# Patient Record
Sex: Female | Born: 1960 | Race: Black or African American | Hispanic: No | Marital: Single | Smoking: Never smoker
Health system: Southern US, Community
[De-identification: ages and names within clinical notes are randomized; demographics above are authoritative.]

## PROBLEM LIST (undated history)

## (undated) DIAGNOSIS — E249 Cushing's syndrome, unspecified: Secondary | ICD-10-CM

## (undated) DIAGNOSIS — D649 Anemia, unspecified: Secondary | ICD-10-CM

## (undated) DIAGNOSIS — K922 Gastrointestinal hemorrhage, unspecified: Secondary | ICD-10-CM

## (undated) DIAGNOSIS — K5792 Diverticulitis of intestine, part unspecified, without perforation or abscess without bleeding: Secondary | ICD-10-CM

## (undated) DIAGNOSIS — E785 Hyperlipidemia, unspecified: Secondary | ICD-10-CM

## (undated) DIAGNOSIS — E282 Polycystic ovarian syndrome: Secondary | ICD-10-CM

## (undated) DIAGNOSIS — K5790 Diverticulosis of intestine, part unspecified, without perforation or abscess without bleeding: Secondary | ICD-10-CM

## (undated) DIAGNOSIS — I10 Essential (primary) hypertension: Secondary | ICD-10-CM

## (undated) DIAGNOSIS — E119 Type 2 diabetes mellitus without complications: Secondary | ICD-10-CM

## (undated) DIAGNOSIS — A419 Sepsis, unspecified organism: Secondary | ICD-10-CM

## (undated) HISTORY — DX: Cushing's syndrome, unspecified: E24.9

## (undated) HISTORY — DX: Diverticulitis of intestine, part unspecified, without perforation or abscess without bleeding: K57.92

## (undated) HISTORY — DX: Gastrointestinal hemorrhage, unspecified: K92.2

## (undated) HISTORY — DX: Polycystic ovarian syndrome: E28.2

## (undated) HISTORY — DX: Essential (primary) hypertension: I10

## (undated) HISTORY — PX: BREAST BIOPSY: SHX20

## (undated) HISTORY — DX: Anemia, unspecified: D64.9

## (undated) HISTORY — DX: Type 2 diabetes mellitus without complications: E11.9

## (undated) HISTORY — PX: CHOLECYSTECTOMY: SHX55

## (undated) HISTORY — DX: Hyperlipidemia, unspecified: E78.5

---
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
## 1994-11-16 HISTORY — PX: ADRENALECTOMY: SHX876

## 1998-03-26 ENCOUNTER — Other Ambulatory Visit: Admission: RE | Admit: 1998-03-26 | Discharge: 1998-03-26 | Payer: Self-pay | Admitting: Internal Medicine

## 1998-05-09 ENCOUNTER — Ambulatory Visit: Admission: RE | Admit: 1998-05-09 | Discharge: 1998-05-09 | Payer: Self-pay | Admitting: Obstetrics & Gynecology

## 1998-07-10 ENCOUNTER — Encounter: Admission: RE | Admit: 1998-07-10 | Discharge: 1998-10-08 | Payer: Self-pay | Admitting: *Deleted

## 2000-04-30 ENCOUNTER — Other Ambulatory Visit: Admission: RE | Admit: 2000-04-30 | Discharge: 2000-04-30 | Payer: Self-pay | Admitting: Internal Medicine

## 2000-05-10 ENCOUNTER — Encounter: Payer: Self-pay | Admitting: Internal Medicine

## 2000-05-10 ENCOUNTER — Ambulatory Visit (HOSPITAL_COMMUNITY): Admission: RE | Admit: 2000-05-10 | Discharge: 2000-05-10 | Payer: Self-pay | Admitting: Internal Medicine

## 2000-05-18 ENCOUNTER — Encounter: Admission: RE | Admit: 2000-05-18 | Discharge: 2000-05-18 | Payer: Self-pay | Admitting: Internal Medicine

## 2000-05-18 ENCOUNTER — Encounter: Payer: Self-pay | Admitting: Internal Medicine

## 2000-12-10 ENCOUNTER — Encounter: Admission: RE | Admit: 2000-12-10 | Discharge: 2000-12-10 | Payer: Self-pay | Admitting: Internal Medicine

## 2000-12-10 ENCOUNTER — Encounter: Payer: Self-pay | Admitting: Internal Medicine

## 2000-12-23 ENCOUNTER — Encounter: Payer: Self-pay | Admitting: Internal Medicine

## 2000-12-23 ENCOUNTER — Encounter: Admission: RE | Admit: 2000-12-23 | Discharge: 2000-12-23 | Payer: Self-pay | Admitting: Internal Medicine

## 2001-06-29 ENCOUNTER — Other Ambulatory Visit: Admission: RE | Admit: 2001-06-29 | Discharge: 2001-06-29 | Payer: Self-pay | Admitting: Internal Medicine

## 2001-07-05 ENCOUNTER — Encounter: Payer: Self-pay | Admitting: Internal Medicine

## 2001-07-05 ENCOUNTER — Encounter: Admission: RE | Admit: 2001-07-05 | Discharge: 2001-07-05 | Payer: Self-pay | Admitting: Internal Medicine

## 2001-07-21 ENCOUNTER — Encounter: Admission: RE | Admit: 2001-07-21 | Discharge: 2001-07-21 | Payer: Self-pay | Admitting: Internal Medicine

## 2001-07-21 ENCOUNTER — Encounter: Payer: Self-pay | Admitting: Internal Medicine

## 2001-12-08 ENCOUNTER — Encounter: Payer: Self-pay | Admitting: Internal Medicine

## 2001-12-08 ENCOUNTER — Encounter: Admission: RE | Admit: 2001-12-08 | Discharge: 2001-12-08 | Payer: Self-pay | Admitting: Internal Medicine

## 2002-09-07 ENCOUNTER — Other Ambulatory Visit: Admission: RE | Admit: 2002-09-07 | Discharge: 2002-09-07 | Payer: Self-pay | Admitting: Internal Medicine

## 2002-09-26 ENCOUNTER — Encounter: Payer: Self-pay | Admitting: Internal Medicine

## 2002-09-26 ENCOUNTER — Encounter: Admission: RE | Admit: 2002-09-26 | Discharge: 2002-09-26 | Payer: Self-pay | Admitting: Internal Medicine

## 2003-10-15 ENCOUNTER — Encounter: Admission: RE | Admit: 2003-10-15 | Discharge: 2003-10-15 | Payer: Self-pay | Admitting: Internal Medicine

## 2005-12-30 ENCOUNTER — Other Ambulatory Visit: Admission: RE | Admit: 2005-12-30 | Discharge: 2005-12-30 | Payer: Self-pay | Admitting: Obstetrics and Gynecology

## 2006-01-05 ENCOUNTER — Encounter: Admission: RE | Admit: 2006-01-05 | Discharge: 2006-01-05 | Payer: Self-pay | Admitting: Internal Medicine

## 2007-05-13 ENCOUNTER — Other Ambulatory Visit: Admission: RE | Admit: 2007-05-13 | Discharge: 2007-05-13 | Payer: Self-pay | Admitting: Obstetrics and Gynecology

## 2009-04-11 ENCOUNTER — Other Ambulatory Visit: Admission: RE | Admit: 2009-04-11 | Discharge: 2009-04-11 | Payer: Self-pay | Admitting: Obstetrics and Gynecology

## 2009-07-25 ENCOUNTER — Encounter: Admission: RE | Admit: 2009-07-25 | Discharge: 2009-07-25 | Payer: Self-pay | Admitting: Obstetrics and Gynecology

## 2012-02-09 ENCOUNTER — Other Ambulatory Visit: Payer: Self-pay | Admitting: *Deleted

## 2012-02-09 MED ORDER — METFORMIN HCL 500 MG PO TABS: 500.0000 mg | ORAL_TABLET | Freq: Two times a day (BID) | ORAL | Status: DC

## 2012-02-22 ENCOUNTER — Ambulatory Visit (INDEPENDENT_AMBULATORY_CARE_PROVIDER_SITE_OTHER): Payer: 59 | Admitting: Physician Assistant

## 2012-02-22 VITALS — BP 154/104 | HR 91 | Temp 99.3°F | Resp 16 | Ht 63.75 in | Wt 262.0 lb

## 2012-02-22 DIAGNOSIS — IMO0001 Reserved for inherently not codable concepts without codable children: Secondary | ICD-10-CM

## 2012-02-22 DIAGNOSIS — H04123 Dry eye syndrome of bilateral lacrimal glands: Secondary | ICD-10-CM

## 2012-02-22 DIAGNOSIS — J310 Chronic rhinitis: Secondary | ICD-10-CM

## 2012-02-22 DIAGNOSIS — H04129 Dry eye syndrome of unspecified lacrimal gland: Secondary | ICD-10-CM

## 2012-02-22 DIAGNOSIS — I10 Essential (primary) hypertension: Secondary | ICD-10-CM

## 2012-02-22 LAB — POCT GLYCOSYLATED HEMOGLOBIN (HGB A1C): Hemoglobin A1C: 8.8

## 2012-02-22 LAB — GLUCOSE, POCT (MANUAL RESULT ENTRY): POC Glucose: 125

## 2012-02-22 MED ORDER — LOSARTAN POTASSIUM-HCTZ 100-12.5 MG PO TABS: 1.0000 | ORAL_TABLET | Freq: Every day | ORAL | Status: DC

## 2012-02-22 MED ORDER — INSULIN GLARGINE 100 UNIT/ML ~~LOC~~ SOLN: 20.0000 [IU] | Freq: Every day | SUBCUTANEOUS | Status: DC

## 2012-02-22 MED ORDER — METFORMIN HCL 500 MG PO TABS: 1000.0000 mg | ORAL_TABLET | Freq: Two times a day (BID) | ORAL | Status: DC

## 2012-02-22 MED ORDER — IPRATROPIUM BROMIDE 0.03 % NA SOLN: 2.0000 | Freq: Two times a day (BID) | NASAL | Status: DC

## 2012-02-22 NOTE — Progress Notes (Signed)
  Subjective:    Patient ID: Carol Mueller, female    DOB: [birth year], 51 y.o.   MRN: [id number]  HPI  This patient presents for follow-up of DM type 2, HTN.  Overall, doing well, without specific concerns. Out of Hyzaar x nearly 2 weeks. Home glucose monitoring qweek.  Generally 160's-170's (fasting). No hypoglycemia. Eye exam was in December, dental exam scheduled for next week.  Left eye (sometimes the right) watery intermittantly x 2 months. Often eyes feel dry. Review of Systems Diarrhea with metformin-wax and wanes. Some nasal congestion with recent weather changes. ROS is otherwise negative.    Objective:   Physical Exam Vital signs noted. Well-developed, well nourished BF who is awake, alert and oriented, in NAD. HEENT: [place]/AT, PERRL, EOMI.  Sclera and conjunctiva are clear.  EAC are patent, TMs are normal in appearance. Nasal mucosa is pink and moist. OP is clear. Neck: supple, non-tender, no lymphadenopathey, thyromegaly. Heart: RRR, no murmur Lungs: CTA Abdomen: normo-active bowel sounds, supple, non-tender, no mass or organomegaly. Extremities: no cyanosis, clubbing or edema. Skin: warm and dry without rash.  Results for orders placed in visit on 02/22/12  GLUCOSE, POCT (MANUAL RESULT ENTRY)      Component Value Range   POC Glucose 125    POCT GLYCOSYLATED HEMOGLOBIN (HGB A1C)      Component Value Range   Hemoglobin A1C 8.8           Assessment & Plan:   1. Rhinitis    2. Type II or unspecified type diabetes mellitus without mention of complication, uncontrolled  POCT glucose (manual entry), POCT glycosylated hemoglobin (Hb A1C), Comprehensive metabolic panel, Lipid panel, Microalbumin, urine, metFORMIN (GLUCOPHAGE) 500 MG tablet, insulin glargine (LANTUS) 100 UNIT/ML injection (increase by 2 units every 2-3 days if fasting BS >140 mg/dl  3. HTN (hypertension)  Comprehensive metabolic panel, losartan-hydrochlorothiazide (HYZAAR) 100-12.5 MG per tablet  4. Dry  eyes, bilateral  Likely due to allergies.  OTC Systane drops.  Atrovent NS.   Re-evaluate in 3 months, sooner if needed.

## 2012-02-22 NOTE — Patient Instructions (Addendum)
Check your blood sugar 2-3 times each week.  If the reading is above 140, then increase your Lantus dose by 2 units.  For your dry eye symptoms, try OTC Systane eyes drops for dry eyes. Call if you have not heard from the appointment schedulers in 2-3 days; about your remaining lab results in 2 weeks.

## 2012-02-23 LAB — COMPREHENSIVE METABOLIC PANEL
ALT: 17 U/L (ref 0–35)
AST: 18 U/L (ref 0–37)
Albumin: 4.5 g/dL (ref 3.5–5.2)
Alkaline Phosphatase: 66 U/L (ref 39–117)
BUN: 12 mg/dL (ref 6–23)
Chloride: 96 mEq/L (ref 96–112)
Potassium: 7.4 mEq/L (ref 3.5–5.3)

## 2012-02-23 LAB — LIPID PANEL
HDL: 55 mg/dL (ref >39)
LDL Cholesterol: 118 mg/dL — ABNORMAL HIGH (ref 0–99)
Total CHOL/HDL Ratio: 3.4 Ratio

## 2012-02-24 ENCOUNTER — Other Ambulatory Visit: Payer: Self-pay | Admitting: Physician Assistant

## 2012-02-24 ENCOUNTER — Telehealth: Payer: Self-pay

## 2012-02-24 DIAGNOSIS — R899 Unspecified abnormal finding in specimens from other organs, systems and tissues: Secondary | ICD-10-CM

## 2012-02-24 LAB — MICROALBUMIN, URINE: Microalb, Ur: 9.15 mg/dL — ABNORMAL HIGH (ref 0.00–1.89)

## 2012-02-24 NOTE — Telephone Encounter (Signed)
[facility] called stating potassium level was critical at 7.4.  Spoke with Kennedy Bucker PA-C, advised patient needs to return to clinic today for repeat labs.  Helmut Muster has placed a new order for repeat labs in system.

## 2012-02-24 NOTE — Telephone Encounter (Signed)
Left message on machine for patient to call back and speak with me concerning labs.  If I'm not available she needs to speak with Delaney Meigs or Morrie Sheldon.

## 2012-02-26 ENCOUNTER — Other Ambulatory Visit (INDEPENDENT_AMBULATORY_CARE_PROVIDER_SITE_OTHER): Payer: 59

## 2012-02-26 DIAGNOSIS — R6889 Other general symptoms and signs: Secondary | ICD-10-CM

## 2012-02-26 DIAGNOSIS — R899 Unspecified abnormal finding in specimens from other organs, systems and tissues: Secondary | ICD-10-CM

## 2012-02-27 LAB — POTASSIUM: Potassium: 3.8 mEq/L (ref 3.5–5.3)

## 2012-02-29 NOTE — Telephone Encounter (Signed)
Pt was notified and RTC on 02/26/12 for repeat K+. See notes on lab results.

## 2012-03-02 NOTE — Progress Notes (Signed)
Left message 03/02/12.

## 2012-03-03 NOTE — Progress Notes (Signed)
Message left for pt 02/23/12 to schedule CPE. No response, reminder letter sent 03/03/12. Lennon Alstrom

## 2012-03-15 ENCOUNTER — Ambulatory Visit (INDEPENDENT_AMBULATORY_CARE_PROVIDER_SITE_OTHER): Payer: 59 | Admitting: Family Medicine

## 2012-03-15 VITALS — BP 137/92 | HR 97 | Temp 98.9°F | Resp 18 | Ht 64.0 in | Wt 250.0 lb

## 2012-03-15 DIAGNOSIS — M542 Cervicalgia: Secondary | ICD-10-CM

## 2012-03-15 DIAGNOSIS — M62838 Other muscle spasm: Secondary | ICD-10-CM

## 2012-03-15 MED ORDER — CYCLOBENZAPRINE HCL 5 MG PO TABS: 5.0000 mg | ORAL_TABLET | Freq: Three times a day (TID) | ORAL | Status: AC | PRN

## 2012-03-15 MED ORDER — NAPROXEN 500 MG PO TABS: 500.0000 mg | ORAL_TABLET | Freq: Two times a day (BID) | ORAL | Status: DC
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
## 2012-03-22 ENCOUNTER — Ambulatory Visit (INDEPENDENT_AMBULATORY_CARE_PROVIDER_SITE_OTHER): Payer: 59 | Admitting: Family Medicine

## 2012-03-22 ENCOUNTER — Ambulatory Visit: Payer: 59

## 2012-03-22 VITALS — BP 134/95 | HR 93 | Temp 98.1°F | Resp 16 | Ht 63.5 in | Wt 263.0 lb

## 2012-03-22 DIAGNOSIS — M542 Cervicalgia: Secondary | ICD-10-CM

## 2012-03-22 MED ORDER — DIAZEPAM 2 MG PO TABS: 2.0000 mg | ORAL_TABLET | Freq: Four times a day (QID) | ORAL | Status: AC | PRN

## 2012-03-22 NOTE — Progress Notes (Signed)
  Subjective:    Patient ID: Carol Mueller, female    DOB: Apr 07, 1961, 51 y.o.   MRN: [id number]  HPI  See prior OV  Unable to tolerate NSAID secondary to GI distress for cervical spine symptoms  Intermittent neck pain; position dependent(rotation); denies radiation to UE or weakness involving UE    Review of Systems     Objective:   Physical Exam  Constitutional: She appears well-developed.  Neck: Neck supple. No thyromegaly present.  Cardiovascular: Normal rate, regular rhythm and normal heart sounds.   Pulmonary/Chest: Effort normal and breath sounds normal.  Musculoskeletal:       Cervical back: She exhibits decreased range of motion, tenderness and spasm (L) SCM. She exhibits no bony tenderness.  Neurological: She is alert. She has normal strength. No sensory deficit.  Reflex Scores:      Bicep reflexes are 2+ on the right side and 2+ on the left side.   [facility] reading (PRIMARY) by  Dr. Hal Hope C spine no degenerative changes     Assessment & Plan:  Cervical strain/ torticollis Gi distress secondary to NSAID   Tylenol BID prn Valium 2 mg for significant spasm of SCM Reassurance/anticipatory guidance

## 2012-03-29 ENCOUNTER — Telehealth: Payer: Self-pay

## 2012-03-29 NOTE — Telephone Encounter (Signed)
LMOM THAT XR READY FOR P/U.

## 2012-03-29 NOTE — Telephone Encounter (Signed)
Pt requesting a copy of his x-ray please call when ready for pick-up

## 2012-04-06 ENCOUNTER — Ambulatory Visit (INDEPENDENT_AMBULATORY_CARE_PROVIDER_SITE_OTHER): Payer: Managed Care, Other (non HMO) | Admitting: Family Medicine

## 2012-04-06 DIAGNOSIS — J329 Chronic sinusitis, unspecified: Secondary | ICD-10-CM

## 2012-04-06 DIAGNOSIS — J309 Allergic rhinitis, unspecified: Secondary | ICD-10-CM | POA: Insufficient documentation

## 2012-04-06 MED ORDER — AZITHROMYCIN 250 MG PO TABS: ORAL_TABLET | ORAL | Status: AC

## 2012-04-06 MED ORDER — FLUTICASONE PROPIONATE 50 MCG/ACT NA SUSP: 2.0000 | Freq: Every day | NASAL | Status: DC

## 2012-04-06 MED ORDER — CETIRIZINE HCL 10 MG PO TABS: 10.0000 mg | ORAL_TABLET | Freq: Every day | ORAL | Status: DC

## 2012-04-06 NOTE — Progress Notes (Addendum)
  Subjective:    Patient ID: Carol Mueller, female    DOB: [birth year], 51 y.o.   MRN: [id number]  HPI URI Symptoms Onset: 1 week  Description: rhinorrhea, nasal congestion, sinus pressure, post nasal drip, greenish nasal discharge  Modifying factors:  Diabetic; CBGs in the 140s per pt.   Symptoms Nasal discharge: yes Fever: no Sore throat: mild Cough: mild Wheezing: no Ear pain: no GI symptoms: no Sick contacts: no  Red Flags  Stiff neck: no Dyspnea: no Rash: no Swallowing difficulty: no  Sinusitis Risk Factors Headache/face pain: no Double sickening: no tooth pain: no  Allergy Risk Factors Sneezing: no Itchy scratchy eyes/throat: yes  Seasonal symptoms: intermittently   Flu Risk Factors Headache: no muscle aches: no severe fatigue: no  Review of Systems See HPI, otherwise ROS negative     Objective:   Physical Exam Gen: up in chair, NAD, obese  HEENT: NCAT, EOMI, exophthalmus, TMs clear bilaterally, +nasal erythema, rhinorrhea bilaterally, + post oropharyngeal erythema  CV: RRR, no murmurs auscultated PULM: CTAB, no wheezes, rales, rhoncii ABD: S/NT/+ bowel sounds  EXT: 2+ peripheral pulses         Assessment & Plan:

## 2012-04-06 NOTE — Assessment & Plan Note (Signed)
Will transition to zyrtec and flonase for treatment. Given baseline diabetes, will cover for infectious (bacterial) process with azithromycin. Handout given. Infectious red flags discussed.     The patient and/or caregiver has been counseled thoroughly with regard to treatment plan and/or medications prescribed including dosage, schedule, interactions, rationale for use, and possible side effects and they verbalize understanding. Diagnoses and expected course of recovery discussed and will return if not improved as expected or if the condition worsens. Patient and/or caregiver verbalized understanding.

## 2012-04-06 NOTE — Patient Instructions (Signed)
Allergic Rhinitis  Allergic rhinitis is when the mucous membranes in the nose respond to allergens. Allergens are particles in the air that cause your body to have an allergic reaction. This causes you to release allergic antibodies. Through a chain of events, these eventually cause you to release histamine into the blood stream (hence the use of antihistamines). Although meant to be protective to the body, it is this release that causes your discomfort, such as frequent sneezing, congestion and an itchy runny nose.    CAUSES    The pollen allergens may come from grasses, trees, and weeds. This is seasonal allergic rhinitis, or "hay fever." Other allergens cause year-round allergic rhinitis (perennial allergic rhinitis) such as house dust mite allergen, pet dander and mold spores.    SYMPTOMS     Nasal stuffiness (congestion).   Runny, itchy nose with sneezing and tearing of the eyes.   There is often an itching of the mouth, eyes and ears.  It cannot be cured, but it can be controlled with medications.  DIAGNOSIS    If you are unable to determine the offending allergen, skin or blood testing may find it.  TREATMENT     Avoid the allergen.   Medications and allergy shots (immunotherapy) can help.   Hay fever may often be treated with antihistamines in pill or nasal spray forms. Antihistamines block the effects of histamine. There are over-the-counter medicines that may help with nasal congestion and swelling around the eyes. Check with your caregiver before taking or giving this medicine.  If the treatment above does not work, there are many new medications your caregiver can prescribe. Stronger medications may be used if initial measures are ineffective. Desensitizing injections can be used if medications and avoidance fails. Desensitization is when a patient is given ongoing shots until the body becomes less sensitive to the allergen. Make sure you follow up with your caregiver if problems continue.  SEEK  MEDICAL CARE IF:     You develop fever (more than 100.5 F (38.1 C).   You develop a cough that does not stop easily (persistent).   You have shortness of breath.   You start wheezing.   Symptoms interfere with normal daily activities.  Document Released: 07/28/2001 Document Revised: 10/22/2011 Document Reviewed: 02/06/2009  [employer] Patient Information 2012 [employer].

## 2012-04-16 DIAGNOSIS — Z0271 Encounter for disability determination: Secondary | ICD-10-CM

## 2012-04-18 ENCOUNTER — Telehealth: Payer: Self-pay

## 2012-04-18 NOTE — Telephone Encounter (Signed)
Pt wants to know why rx was denied

## 2012-04-19 NOTE — Telephone Encounter (Signed)
LMOM FOR PT TO CB.  FIND OUT WHICH MED WAS DENIED

## 2012-04-19 NOTE — Telephone Encounter (Signed)
Spoke w/pt who reported that her Solostar pen needles were denied. Told pt that I am not sure why it was denied, but that we can RF them. Called Rx into [facility] [address] for Solostar pen needles #100 w/3 RFs

## 2012-04-28 ENCOUNTER — Encounter: Payer: 59 | Admitting: Physician Assistant

## 2012-06-28 ENCOUNTER — Encounter: Payer: Managed Care, Other (non HMO) | Admitting: Physician Assistant

## 2012-08-09 ENCOUNTER — Other Ambulatory Visit: Payer: Self-pay | Admitting: Physician Assistant

## 2012-08-23 ENCOUNTER — Encounter: Payer: Managed Care, Other (non HMO) | Admitting: Physician Assistant

## 2012-09-09 ENCOUNTER — Ambulatory Visit (INDEPENDENT_AMBULATORY_CARE_PROVIDER_SITE_OTHER): Payer: Managed Care, Other (non HMO) | Admitting: Family Medicine

## 2012-09-09 ENCOUNTER — Encounter: Payer: Self-pay | Admitting: Family Medicine

## 2012-09-09 VITALS — BP 162/98 | HR 104 | Temp 98.3°F | Resp 18 | Ht 63.0 in | Wt 261.0 lb

## 2012-09-09 DIAGNOSIS — I10 Essential (primary) hypertension: Secondary | ICD-10-CM

## 2012-09-09 DIAGNOSIS — Z1231 Encounter for screening mammogram for malignant neoplasm of breast: Secondary | ICD-10-CM

## 2012-09-09 DIAGNOSIS — E785 Hyperlipidemia, unspecified: Secondary | ICD-10-CM

## 2012-09-09 DIAGNOSIS — E119 Type 2 diabetes mellitus without complications: Secondary | ICD-10-CM

## 2012-09-09 DIAGNOSIS — E669 Obesity, unspecified: Secondary | ICD-10-CM

## 2012-09-09 DIAGNOSIS — E282 Polycystic ovarian syndrome: Secondary | ICD-10-CM

## 2012-09-09 DIAGNOSIS — E249 Cushing's syndrome, unspecified: Secondary | ICD-10-CM

## 2012-09-09 DIAGNOSIS — Z0000 Encounter for general adult medical examination without abnormal findings: Secondary | ICD-10-CM

## 2012-09-09 DIAGNOSIS — Z79899 Other long term (current) drug therapy: Secondary | ICD-10-CM

## 2012-09-09 DIAGNOSIS — IMO0001 Reserved for inherently not codable concepts without codable children: Secondary | ICD-10-CM

## 2012-09-09 DIAGNOSIS — Z23 Encounter for immunization: Secondary | ICD-10-CM

## 2012-09-09 LAB — LIPID PANEL
Cholesterol: 196 mg/dL (ref 0–200)
Total CHOL/HDL Ratio: 4.6 Ratio
VLDL: 15 mg/dL (ref 0–40)

## 2012-09-09 LAB — TSH: TSH: 1.113 u[IU]/mL (ref 0.350–4.500)

## 2012-09-09 LAB — COMPREHENSIVE METABOLIC PANEL
Alkaline Phosphatase: 57 U/L (ref 39–117)
Glucose, Bld: 170 mg/dL — ABNORMAL HIGH (ref 70–99)
Sodium: 135 mEq/L (ref 135–145)
Total Bilirubin: 0.6 mg/dL (ref 0.3–1.2)
Total Protein: 7.6 g/dL (ref 6.0–8.3)

## 2012-09-09 LAB — POCT GLYCOSYLATED HEMOGLOBIN (HGB A1C): Hemoglobin A1C: 8.7

## 2012-09-09 MED ORDER — METFORMIN HCL 1000 MG PO TABS: 1000.0000 mg | ORAL_TABLET | Freq: Two times a day (BID) | ORAL | Status: DC

## 2012-09-09 MED ORDER — INSULIN GLARGINE 100 UNIT/ML ~~LOC~~ SOLN: 20.0000 [IU] | Freq: Every day | SUBCUTANEOUS | Status: DC

## 2012-09-09 MED ORDER — EXENATIDE 5 MCG/0.02ML ~~LOC~~ SOPN: 5.0000 ug | PEN_INJECTOR | Freq: Two times a day (BID) | SUBCUTANEOUS | Status: DC

## 2012-09-09 MED ORDER — HYDROCHLOROTHIAZIDE 12.5 MG PO TABS: 12.5000 mg | ORAL_TABLET | Freq: Every day | ORAL | Status: DC
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
## 2012-09-12 ENCOUNTER — Encounter: Payer: Self-pay | Admitting: Family Medicine

## 2012-09-12 DIAGNOSIS — E785 Hyperlipidemia, unspecified: Secondary | ICD-10-CM | POA: Insufficient documentation

## 2012-09-12 DIAGNOSIS — E119 Type 2 diabetes mellitus without complications: Secondary | ICD-10-CM | POA: Insufficient documentation

## 2012-09-12 DIAGNOSIS — IMO0002 Reserved for concepts with insufficient information to code with codable children: Secondary | ICD-10-CM | POA: Insufficient documentation

## 2012-09-12 DIAGNOSIS — E1169 Type 2 diabetes mellitus with other specified complication: Secondary | ICD-10-CM | POA: Insufficient documentation

## 2012-09-12 DIAGNOSIS — E249 Cushing's syndrome, unspecified: Secondary | ICD-10-CM | POA: Insufficient documentation

## 2012-09-12 DIAGNOSIS — IMO0001 Reserved for inherently not codable concepts without codable children: Secondary | ICD-10-CM | POA: Insufficient documentation

## 2012-09-12 DIAGNOSIS — E1159 Type 2 diabetes mellitus with other circulatory complications: Secondary | ICD-10-CM | POA: Insufficient documentation

## 2012-09-12 DIAGNOSIS — I10 Essential (primary) hypertension: Secondary | ICD-10-CM | POA: Insufficient documentation

## 2012-09-12 DIAGNOSIS — E1165 Type 2 diabetes mellitus with hyperglycemia: Secondary | ICD-10-CM | POA: Insufficient documentation

## 2012-09-12 DIAGNOSIS — E282 Polycystic ovarian syndrome: Secondary | ICD-10-CM | POA: Insufficient documentation

## 2012-09-12 LAB — PAP IG, CT-NG, RFX HPV ASCU
Chlamydia Probe Amp: NEGATIVE
GC Probe Amp: NEGATIVE

## 2012-09-12 MED ORDER — EXENATIDE 5 MCG/0.02ML ~~LOC~~ SOPN: 10.0000 ug | PEN_INJECTOR | Freq: Two times a day (BID) | SUBCUTANEOUS | Status: DC

## 2012-09-12 MED ORDER — LOSARTAN POTASSIUM-HCTZ 100-25 MG PO TABS: 1.0000 | ORAL_TABLET | Freq: Every day | ORAL | Status: DC

## 2012-09-12 MED ORDER — PRAVASTATIN SODIUM 40 MG PO TABS: 40.0000 mg | ORAL_TABLET | Freq: Every day | ORAL | Status: DC

## 2012-09-13 ENCOUNTER — Encounter: Payer: Self-pay | Admitting: Gastroenterology

## 2012-10-18 ENCOUNTER — Ambulatory Visit
Admission: RE | Admit: 2012-10-18 | Discharge: 2012-10-18 | Disposition: A | Discharge status: Home / Self Care | Payer: Managed Care, Other (non HMO) | Source: Ambulatory Visit | Attending: Family Medicine | Admitting: Family Medicine

## 2012-10-18 DIAGNOSIS — Z1231 Encounter for screening mammogram for malignant neoplasm of breast: Secondary | ICD-10-CM

## 2012-10-21 ENCOUNTER — Other Ambulatory Visit: Payer: Self-pay | Admitting: Family Medicine

## 2012-10-21 DIAGNOSIS — R928 Other abnormal and inconclusive findings on diagnostic imaging of breast: Secondary | ICD-10-CM

## 2012-10-24 ENCOUNTER — Telehealth: Payer: Self-pay

## 2012-10-24 NOTE — Telephone Encounter (Signed)
Further evaluation is suggested for calcifications in the left breast.  RECOMMENDATION:  Diagnostic mammogram of the left breast. (Code:[id number])  BI-RADS CATEGORY 0: Incomplete. Need additional imaging  evaluation and/or prior mammograms for comparison.   Patient was sch for additional views of Left breast, these results not in yet.

## 2012-10-24 NOTE — Telephone Encounter (Signed)
PT WOULD LIKE TO TALK TO DR. SHAW ABOUT HER MAMMOGRAM.  [phone]

## 2012-10-25 NOTE — Telephone Encounter (Signed)
Called pt. No answer. LVM that i will try again later.

## 2012-10-26 ENCOUNTER — Encounter: Payer: Self-pay | Admitting: Gastroenterology

## 2012-10-27 NOTE — Telephone Encounter (Signed)
Pt wanted to know if this was the same breast and calcifications she had prior - was followed closely for 18 mos after 1 abnml mammogram yrs ago - was done at the same location as this mammogram.  She will f/u and get the additional views. No further questions.

## 2012-10-28 ENCOUNTER — Other Ambulatory Visit: Payer: Self-pay | Admitting: *Deleted

## 2012-10-28 DIAGNOSIS — E119 Type 2 diabetes mellitus without complications: Secondary | ICD-10-CM

## 2012-10-28 MED ORDER — EXENATIDE 5 MCG/0.02ML ~~LOC~~ SOPN: 10.0000 ug | PEN_INJECTOR | Freq: Two times a day (BID) | SUBCUTANEOUS | Status: DC

## 2012-11-01 ENCOUNTER — Other Ambulatory Visit: Payer: Self-pay | Admitting: Family Medicine

## 2012-11-01 ENCOUNTER — Ambulatory Visit
Admission: RE | Admit: 2012-11-01 | Discharge: 2012-11-01 | Disposition: A | Discharge status: Home / Self Care | Payer: Managed Care, Other (non HMO) | Source: Ambulatory Visit | Attending: Family Medicine | Admitting: Family Medicine

## 2012-11-01 DIAGNOSIS — R928 Other abnormal and inconclusive findings on diagnostic imaging of breast: Secondary | ICD-10-CM

## 2012-11-14 ENCOUNTER — Other Ambulatory Visit: Payer: Self-pay | Admitting: Family Medicine

## 2012-11-14 ENCOUNTER — Ambulatory Visit
Admission: RE | Admit: 2012-11-14 | Discharge: 2012-11-14 | Disposition: A | Discharge status: Home / Self Care | Payer: Managed Care, Other (non HMO) | Source: Ambulatory Visit | Attending: Family Medicine | Admitting: Family Medicine

## 2012-11-14 DIAGNOSIS — R928 Other abnormal and inconclusive findings on diagnostic imaging of breast: Secondary | ICD-10-CM

## 2012-11-21 ENCOUNTER — Encounter: Payer: Self-pay | Admitting: Gastroenterology

## 2012-11-25 ENCOUNTER — Encounter: Payer: Self-pay | Admitting: Gastroenterology

## 2012-12-19 ENCOUNTER — Other Ambulatory Visit: Payer: Self-pay | Admitting: Physician Assistant

## 2012-12-19 NOTE — Telephone Encounter (Signed)
Due for OV and labs, last seen for DM Oct 2013

## 2012-12-21 ENCOUNTER — Ambulatory Visit (AMBULATORY_SURGERY_CENTER): Payer: Medicare HMO

## 2012-12-21 VITALS — Ht 64.0 in | Wt 262.0 lb

## 2012-12-21 DIAGNOSIS — Z1211 Encounter for screening for malignant neoplasm of colon: Secondary | ICD-10-CM

## 2012-12-21 MED ORDER — MOVIPREP 100 G PO SOLR: 1.0000 | Freq: Once | ORAL | Status: DC

## 2012-12-22 ENCOUNTER — Encounter: Payer: Self-pay | Admitting: Gastroenterology

## 2013-01-01 ENCOUNTER — Other Ambulatory Visit: Payer: Self-pay | Admitting: Family Medicine

## 2013-01-04 ENCOUNTER — Encounter: Payer: Self-pay | Admitting: Gastroenterology

## 2013-01-09 ENCOUNTER — Encounter: Payer: Self-pay | Admitting: Gastroenterology

## 2013-01-09 ENCOUNTER — Ambulatory Visit (AMBULATORY_SURGERY_CENTER): Payer: Medicare HMO | Admitting: Gastroenterology

## 2013-01-09 ENCOUNTER — Other Ambulatory Visit: Payer: Self-pay | Admitting: Gastroenterology

## 2013-01-09 VITALS — BP 147/94 | HR 94 | Resp 16 | Ht 64.0 in | Wt 262.0 lb

## 2013-01-09 DIAGNOSIS — Z1211 Encounter for screening for malignant neoplasm of colon: Secondary | ICD-10-CM

## 2013-01-09 MED ORDER — SODIUM CHLORIDE 0.9 % IV SOLN: 500.0000 mL | INTRAVENOUS | Status: DC

## 2013-01-09 NOTE — Op Note (Signed)
[facility] [address] [place], [address]   COLONOSCOPY PROCEDURE REPORT  PATIENT: Carol Mueller, Carol Mueller  MR#: [id number] BIRTHDATE: 1961-08-23 , 51  yrs. old GENDER: Female ENDOSCOPIST: Mardella Layman, MD, Clementeen Graham REFERRED BY:  Norberto Sorenson, M.D. PROCEDURE DATE:  01/09/2013 PROCEDURE:   Colonoscopy, screening ASA CLASS:   Class III INDICATIONS:Average risk patient for colon cancer. MEDICATIONS: propofol (Diprivan) 250mg  IV  DESCRIPTION OF PROCEDURE:   After the risks and benefits and of the procedure were explained, informed consent was obtained.  A digital rectal exam revealed no abnormalities of the rectum.    The LB CF-H180AL [id number] and LB CF-H180AL [id number]  endoscope was introduced through the anus and advanced to the cecum, which was identified by both the appendix and ileocecal valve .  The quality of the prep was excellent, using MoviPrep .  The instrument was then slowly withdrawn as the colon was fully examined.     COLON FINDINGS: Moderate diverticulosis was noted in the descending colon and sigmoid colon.   The colon was otherwise normal.  There was no diverticulosis, inflammation, polyps or cancers unless previously stated.     Retroflexed views revealed internal hemorrhoids.     The scope was then withdrawn from the patient and the procedure completed.  COMPLICATIONS: There were no complications. ENDOSCOPIC IMPRESSION: 1.   Moderate diverticulosis was noted in the descending colon and sigmoid colon 2.   The colon was otherwise normal ...no polyps noted  RECOMMENDATIONS: 1.  High fiber diet 2.  Continue current colorectal screening recommendations for "routine risk" patients with a repeat colonoscopy in 10 years.   REPEAT EXAM:  cc:  _______________________________ eSignedMardella Layman, MD, [facility] 01/09/2013 2:32 PM

## 2013-01-09 NOTE — Progress Notes (Signed)
Patient did not have preoperative order for IV antibiotic SSI prophylaxis. (G8918)  Patient did not experience any of the following events: a burn prior to discharge; a fall within the facility; wrong site/side/patient/procedure/implant event; or a hospital transfer or hospital admission upon discharge from the facility. (G8907)  

## 2013-01-09 NOTE — Patient Instructions (Addendum)
YOU HAD AN ENDOSCOPIC PROCEDURE TODAY AT THE [facility]: Refer to the procedure report that was given to you for any specific questions about what was found during the examination.  If the procedure report does not answer your questions, please call your gastroenterologist to clarify.  If you requested that your care partner not be given the details of your procedure findings, then the procedure report has been included in a sealed envelope for you to review at your convenience later.  YOU SHOULD EXPECT: Some feelings of bloating in the abdomen. Passage of more gas than usual.  Walking can help get rid of the air that was put into your GI tract during the procedure and reduce the bloating. If you had a lower endoscopy (such as a colonoscopy or flexible sigmoidoscopy) you may notice spotting of blood in your stool or on the toilet paper. If you underwent a bowel prep for your procedure, then you may not have a normal bowel movement for a few days.  DIET: Your first meal following the procedure should be a light meal and then it is ok to progress to your normal diet.  A half-sandwich or bowl of soup is an example of a good first meal.  Heavy or fried foods are harder to digest and may make you feel nauseous or bloated.  Likewise meals heavy in dairy and vegetables can cause extra gas to form and this can also increase the bloating.  Drink plenty of fluids but you should avoid alcoholic beverages for 24 hours.  ACTIVITY: Your care partner should take you home directly after the procedure.  You should plan to take it easy, moving slowly for the rest of the day.  You can resume normal activity the day after the procedure however you should NOT DRIVE or use heavy machinery for 24 hours (because of the sedation medicines used during the test).    SYMPTOMS TO REPORT IMMEDIATELY: A gastroenterologist can be reached at any hour.  During normal business hours, 8:30 AM to 5:00 PM Monday through Friday,  call [phone].  After hours and on weekends, please call the GI answering service at [phone] who will take a message and have the physician on call contact you.   Following lower endoscopy (colonoscopy or flexible sigmoidoscopy):  Excessive amounts of blood in the stool  Significant tenderness or worsening of abdominal pains  Swelling of the abdomen that is new, acute  Fever of 100F or higher  Following upper endoscopy (EGD)  Vomiting of blood or coffee ground material  New chest pain or pain under the shoulder blades  Painful or persistently difficult swallowing  New shortness of breath  Fever of 100F or higher  Black, tarry-looking stools  FOLLOW UP: If any biopsies were taken you will be contacted by phone or by letter within the next 1-3 weeks.  Call your gastroenterologist if you have not heard about the biopsies in 3 weeks.  Our staff will call the home number listed on your records the next business day following your procedure to check on you and address any questions or concerns that you may have at that time regarding the information given to you following your procedure. This is a courtesy call and so if there is no answer at the home number and we have not heard from you through the emergency physician on call, we will assume that you have returned to your regular daily activities without incident.  SIGNATURES/CONFIDENTIALITY: You and/or your care   partner have signed paperwork which will be entered into your electronic medical record.  These signatures attest to the fact that that the information above on your After Visit Summary has been reviewed and is understood.  Full responsibility of the confidentiality of this discharge information lies with you and/or your care-partner.  

## 2013-01-10 ENCOUNTER — Telehealth: Payer: Self-pay | Admitting: *Deleted

## 2013-01-10 NOTE — Telephone Encounter (Signed)
  Follow up Call-  Call back number 01/09/2013  Post procedure Call Back phone  # [phone]  Permission to leave phone message Yes     Patient questions:  Left message to call us if necessary.

## 2013-02-12 ENCOUNTER — Other Ambulatory Visit: Payer: Self-pay | Admitting: Family Medicine

## 2013-02-17 ENCOUNTER — Ambulatory Visit (INDEPENDENT_AMBULATORY_CARE_PROVIDER_SITE_OTHER): Payer: Managed Care, Other (non HMO) | Admitting: Family Medicine

## 2013-02-17 VITALS — BP 154/90 | HR 77 | Temp 98.8°F | Resp 18 | Wt 256.0 lb

## 2013-02-17 DIAGNOSIS — N61 Mastitis without abscess: Secondary | ICD-10-CM

## 2013-02-17 MED ORDER — DOXYCYCLINE HYCLATE 100 MG PO TABS: 100.0000 mg | ORAL_TABLET | Freq: Two times a day (BID) | ORAL | Status: DC

## 2013-02-17 NOTE — Patient Instructions (Addendum)
Apply hot compresses to the area on your breast, and take the antibiotic with plenty of water.  If the area is nor resolved in the next week please let me know- Sooner if worse.

## 2013-02-17 NOTE — Progress Notes (Signed)
[facility] [address], [place] [address] [phone]  Date:  02/17/2013   Name:  Carol Mueller   DOB:  1961-10-02   MRN:  [id number]  PCP:  Norberto Sorenson, MD    Chief Complaint: ? hair follicle infection on right breast   History of Present Illness:  Carol Mueller is a 52 y.o. very pleasant female patient who presents with the following:  She is here today with a concern about a possible infection on her right breast.   First noted enlargement of a black pore on the skin less than a week ago, and two days ago the area turned red and is tender to the touch.  She last had a mammogram in November of 2013- she did have a bx of her LEFT breast, but it was benign Otherwise she feels well and has not noted any other breast abnormality She is not SA, and is menopausal  Patient Active Problem List  Diagnosis  . Allergic rhinitis  . Diabetes mellitus  . Polycystic disease, ovaries  . Obesity  . Hypertension  . Encounter for long-term (current) use of other medications  . Type II or unspecified type diabetes mellitus without mention of complication, uncontrolled  . Hyperlipidemia LDL goal < 100  . 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 Anemia   . Hypertension   . Polycystic ovary disease   . 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 Diabetes mellitus without complication     insulin-dependent  . Hyperlipidemia     Past Surgical History  Procedure Laterality Date  . Cholecystectomy    . Adrenalectomy  1996    History  Substance Use Topics  . Smoking status: Never Smoker   . Smokeless tobacco: Never Used  . Alcohol Use: No    Family History  Problem Relation Age of Onset  . Hypertension Mother   . Hypertension Father   . Diabetes Father   . Hypertension Sister   . Diabetes Sister   . Hypertension Brother   . Diabetes Brother   . Hypertension Maternal Grandfather   . Diabetes Paternal  Grandmother   . Colon cancer Neg Hx     Allergies  Allergen Reactions  . Norvasc (Amlodipine Besylate) Swelling    Medication list has been reviewed and updated.  Current Outpatient Prescriptions on File Prior to Visit  Medication Sig Dispense Refill  . B-D ULTRAFINE III SHORT PEN 31G X 8 MM MISC USE AS DIRECTED  100 each  3  . cetirizine (ZYRTEC) 10 MG tablet Take 1 tablet (10 mg total) by mouth daily.  30 tablet  11  . cholecalciferol (VITAMIN D) 400 UNITS TABS Take by mouth.      . insulin glargine (LANTUS) 100 UNIT/ML injection Inject 20-40 Units into the skin at bedtime. Increase the dose by 2 units for readings above 140 mg/dl  30 mL  3  . losartan-hydrochlorothiazide (HYZAAR) 100-25 MG per tablet Take 1 tablet by mouth daily. Needs office visit  30 tablet  0  . magnesium gluconate (MAGONATE) 500 MG tablet Take 500 mg by mouth 2 (two) times daily.      . metFORMIN (GLUCOPHAGE) 1000 MG tablet Take 1 tablet (1,000 mg total) by mouth 2 (two) times daily with a meal.  360 tablet  1  . Multiple Vitamins-Minerals (MULTIVITAMIN WITH MINERALS) tablet Take 1 tablet by mouth daily.  No current facility-administered medications on file prior to visit.    Review of Systems:  As per HPI- otherwise negative.Marland Kitchen  Physical Examination: Filed Vitals:   02/17/13 1745  BP: 154/90  Pulse: 77  Temp: 98.8 F (37.1 C)  Resp: 18   Filed Vitals:   02/17/13 1745  Weight: 256 lb (116.121 kg)   Body mass index is 43.92 kg/(m^2). Ideal Body Weight:    GEN: WDWN, NAD, Non-toxic, A & O x 3, obese HEENT: Atraumatic, Normocephalic. Neck supple. No masses, No LAD. Ears and Nose: No external deformity. CV: RRR, No M/G/R. No JVD. No thrill. No extra heart sounds. PULM: CTA B, no wheezes, crackles, rhonchi. No retractions. No resp. distress. No accessory muscle use. ABD: S, NT, ND. EXTR: No c/c/e NEURO Normal gait.  PSYCH: Normally interactive. Conversant. Not depressed or anxious appearing.   Calm demeanor.  Breasts: no lumps or masses, no discharge or dimpling At 2:30 on the right breast is a small, tender, red area.   It is nor fluctuant, and is about the size of a pencil eraser.  No drainage.  It does appear to have a possible pore in the center which may indicate a sebaceous cyst.  The pt indicates this as the black spot she had noted on her skin   Assessment and Plan: Cellulitis of female breast - Plan: doxycycline (VIBRA-TABS) 100 MG tablet  Cellulitis and possible sebaceous cyst of the right breast.  No fluctuance to suggest that it can be drained at this time.  Will treat with abx and hot compresses, and she will be sure to come back for evaluation if not resolved within a week- Sooner if worse.     Signed Abbe Amsterdam, MD

## 2013-03-20 ENCOUNTER — Other Ambulatory Visit: Payer: Self-pay | Admitting: Physician Assistant

## 2013-07-24 ENCOUNTER — Other Ambulatory Visit: Payer: Self-pay | Admitting: Family Medicine

## 2013-07-24 DIAGNOSIS — D242 Benign neoplasm of left breast: Secondary | ICD-10-CM

## 2013-08-01 ENCOUNTER — Ambulatory Visit
Admission: RE | Admit: 2013-08-01 | Discharge: 2013-08-01 | Disposition: A | Discharge status: Home / Self Care | Payer: Managed Care, Other (non HMO) | Source: Ambulatory Visit | Attending: Family Medicine | Admitting: Family Medicine

## 2013-08-01 ENCOUNTER — Other Ambulatory Visit: Payer: Self-pay | Admitting: Family Medicine

## 2013-08-01 DIAGNOSIS — R921 Mammographic calcification found on diagnostic imaging of breast: Secondary | ICD-10-CM

## 2013-08-01 DIAGNOSIS — D242 Benign neoplasm of left breast: Secondary | ICD-10-CM

## 2013-08-03 ENCOUNTER — Ambulatory Visit
Admission: RE | Admit: 2013-08-03 | Discharge: 2013-08-03 | Disposition: A | Discharge status: Home / Self Care | Payer: Managed Care, Other (non HMO) | Source: Ambulatory Visit | Attending: Family Medicine | Admitting: Family Medicine

## 2013-08-03 DIAGNOSIS — R921 Mammographic calcification found on diagnostic imaging of breast: Secondary | ICD-10-CM

## 2013-08-23 ENCOUNTER — Other Ambulatory Visit (HOSPITAL_COMMUNITY): Payer: Self-pay | Admitting: Endocrinology

## 2013-08-23 DIAGNOSIS — N83209 Unspecified ovarian cyst, unspecified side: Secondary | ICD-10-CM

## 2013-08-25 ENCOUNTER — Ambulatory Visit (HOSPITAL_COMMUNITY): Payer: Managed Care, Other (non HMO)

## 2013-08-30 ENCOUNTER — Ambulatory Visit (HOSPITAL_COMMUNITY)
Admission: RE | Admit: 2013-08-30 | Discharge: 2013-08-30 | Disposition: A | Discharge status: Home / Self Care | Payer: Managed Care, Other (non HMO) | Source: Ambulatory Visit | Attending: Endocrinology | Admitting: Endocrinology

## 2013-08-30 ENCOUNTER — Other Ambulatory Visit (HOSPITAL_COMMUNITY): Payer: Self-pay | Admitting: Endocrinology

## 2013-08-30 DIAGNOSIS — E282 Polycystic ovarian syndrome: Secondary | ICD-10-CM | POA: Insufficient documentation

## 2013-08-30 DIAGNOSIS — N83209 Unspecified ovarian cyst, unspecified side: Secondary | ICD-10-CM

## 2013-08-30 DIAGNOSIS — D259 Leiomyoma of uterus, unspecified: Secondary | ICD-10-CM | POA: Insufficient documentation

## 2013-09-07 ENCOUNTER — Other Ambulatory Visit: Payer: Self-pay | Admitting: Family Medicine

## 2013-09-18 ENCOUNTER — Other Ambulatory Visit: Payer: Self-pay | Admitting: Family Medicine

## 2013-09-19 ENCOUNTER — Other Ambulatory Visit: Payer: Self-pay | Admitting: *Deleted

## 2013-09-19 MED ORDER — INSULIN GLARGINE 100 UNIT/ML SOLOSTAR PEN: PEN_INJECTOR | SUBCUTANEOUS | Status: DC

## 2013-10-15 ENCOUNTER — Other Ambulatory Visit: Payer: Self-pay | Admitting: Physician Assistant

## 2013-10-17 ENCOUNTER — Other Ambulatory Visit: Payer: Self-pay | Admitting: Physician Assistant

## 2013-10-24 ENCOUNTER — Other Ambulatory Visit: Payer: Self-pay | Admitting: Physician Assistant

## 2013-11-01 ENCOUNTER — Other Ambulatory Visit: Payer: Self-pay

## 2013-11-01 DIAGNOSIS — Z1231 Encounter for screening mammogram for malignant neoplasm of breast: Secondary | ICD-10-CM

## 2013-11-13 ENCOUNTER — Ambulatory Visit
Admission: RE | Admit: 2013-11-13 | Discharge: 2013-11-13 | Disposition: A | Discharge status: Home / Self Care | Payer: Self-pay | Source: Ambulatory Visit

## 2013-11-13 DIAGNOSIS — Z1231 Encounter for screening mammogram for malignant neoplasm of breast: Secondary | ICD-10-CM

## 2013-12-10 ENCOUNTER — Other Ambulatory Visit: Payer: Self-pay | Admitting: Physician Assistant

## 2014-03-26 ENCOUNTER — Other Ambulatory Visit: Payer: Self-pay | Admitting: Physician Assistant

## 2014-03-27 ENCOUNTER — Other Ambulatory Visit: Payer: Self-pay | Admitting: Physician Assistant

## 2014-05-09 ENCOUNTER — Ambulatory Visit (INDEPENDENT_AMBULATORY_CARE_PROVIDER_SITE_OTHER): Payer: Managed Care, Other (non HMO) | Admitting: Family Medicine

## 2014-05-09 ENCOUNTER — Ambulatory Visit (INDEPENDENT_AMBULATORY_CARE_PROVIDER_SITE_OTHER): Payer: Managed Care, Other (non HMO)

## 2014-05-09 VITALS — BP 128/80 | HR 86 | Temp 99.9°F | Resp 14 | Ht 66.0 in | Wt 250.0 lb

## 2014-05-09 DIAGNOSIS — R109 Unspecified abdominal pain: Secondary | ICD-10-CM

## 2014-05-09 DIAGNOSIS — R143 Flatulence: Secondary | ICD-10-CM

## 2014-05-09 DIAGNOSIS — R142 Eructation: Secondary | ICD-10-CM

## 2014-05-09 DIAGNOSIS — R140 Abdominal distension (gaseous): Secondary | ICD-10-CM

## 2014-05-09 DIAGNOSIS — R141 Gas pain: Secondary | ICD-10-CM

## 2014-05-09 LAB — POCT UA - MICROSCOPIC ONLY
Bacteria, U Microscopic: NEGATIVE
CASTS, UR, LPF, POC: NEGATIVE
Crystals, Ur, HPF, POC: NEGATIVE
Mucus, UA: NEGATIVE
WBC, Ur, HPF, POC: NEGATIVE
YEAST UA: NEGATIVE

## 2014-05-09 LAB — POCT URINALYSIS DIPSTICK
Bilirubin, UA: NEGATIVE
Blood, UA: NEGATIVE
Ketones, UA: NEGATIVE
Leukocytes, UA: NEGATIVE
NITRITE UA: NEGATIVE
Protein, UA: NEGATIVE
Spec Grav, UA: <=1.005
UROBILINOGEN UA: 0.2
pH, UA: 5

## 2014-05-09 MED ORDER — PANTOPRAZOLE SODIUM 40 MG PO TBEC: 40.0000 mg | DELAYED_RELEASE_TABLET | Freq: Every day | ORAL | Status: DC

## 2014-05-09 NOTE — Patient Instructions (Signed)
Take simethicone up to 4 times daily (with meals and at bedtime). I will contact you with the over-reading of the xray. We may need to refer you for additional evaluation.

## 2014-05-09 NOTE — Progress Notes (Signed)
Subjective:    Patient ID: Carol Mueller, female    DOB: 1961/06/15, 53 y.o.   MRN: [id number]   PCP: Delman Cheadle, MD  Chief Complaint  Patient presents with  . Abdominal Pain    off and on pain for a couple of weeks    Medications, allergies, past medical history, surgical history, family history, social history and problem list reviewed and updated.  Prior to Admission medications   Medication Sig Start Date End Date Taking? Authorizing Provider  Canagliflozin (INVOKANA) 100 MG TABS Take by mouth.   Yes Historical Provider, MD  carvedilol (COREG) 3.125 MG tablet Take 3.125 mg by mouth 2 (two) times daily with a meal.   Yes Historical Provider, MD  Insulin Glargine (LANTUS SOLOSTAR) 100 UNIT/ML SOPN Inject 20 to 40 units into the skin HS. Increase dose by 2 units for readings above 140. PATIENT NEEDS OFFICE VISIT FOR ADDITIONAL REFILLS 09/19/13  Yes Eleanore Kurtis Bushman, PA-C  Insulin Pen Needle (B-D ULTRAFINE III SHORT PEN) 31G X 8 MM MISC Use to inject insulin every night. PATIENT NEEDS OFFICE VISIT FOR ADDITIONAL REFILLS 12/11/13  Yes Mancel Bale, PA-C  losartan-hydrochlorothiazide (HYZAAR) 100-25 MG per tablet TAKE 1 TABLET BY MOUTH DAILY. NEEDS OFFICE VISIT 03/20/13  Yes Shawnee Knapp, MD  metFORMIN (GLUCOPHAGE) 1000 MG tablet Take 1 tablet (1,000 mg total) by mouth 2 (two) times daily with a meal. PATIENT NEEDS OFFICE VISIT FOR ADDITIONAL REFILLS 09/07/13  Yes Theda Sers, PA-C  Multiple Vitamins-Minerals (MULTIVITAMIN WITH MINERALS) tablet Take 1 tablet by mouth daily.   Yes Historical Provider, MD  cetirizine (ZYRTEC) 10 MG tablet Take 1 tablet (10 mg total) by mouth daily. 04/06/12 04/06/13  Shanda Howells, MD  cholecalciferol (VITAMIN D) 400 UNITS TABS Take by mouth.    Historical Provider, MD  magnesium gluconate (MAGONATE) 500 MG tablet Take 500 mg by mouth 2 (two) times daily.    Historical Provider, MD   Patient Active Problem List   Diagnosis Date Noted  . Diabetes mellitus  09/12/2012  . Polycystic disease, ovaries 09/12/2012  . Obesity 09/12/2012  . Hypertension 09/12/2012  . Type II or unspecified type diabetes mellitus without mention of complication, uncontrolled 09/12/2012  . Hyperlipidemia LDL goal < 100 09/12/2012  . 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 Allergic rhinitis 04/06/2012    HPI  At least two weeks of belly pain.  A fullness.  Bloated. R>L, but can occur bilaterally. Comes and goes. Progressively worsening, rates a 10/10 today. Also several days of RIGHT sided low back pain, sharp. Today notes increased urination.  No hematuria. No fever or chills.  BM daily, often loose with metformin use.  Today it was green-has been eating a lot more spinach lately. Reports fasting blood sugars range 100-110's.  No nausea or vomiting. No weight change.  She has not had a menstrual cycle since about age 48.  Hormone levels suggest that she should be menstruating, but she also has PCOS.  Also known uterine fibroids. No vaginal discharge.  Colonoscopy last year was normal except for a little diverticulosis.  No recent trauma. No medication changes. Used a new machine at the gym for abdominal exercises about a month ago.  Felt a pop and saw a bulge.  The next day, she had some nausea, which then resolved.  400 mg Ibuprofen yesterday and again today helped her back and abdominal pain.  Water aerobics or Zumba seem to reduce the discomfort the following day.  Has also experienced early satiety for some time-longer than the bloating and discomfort.  Used OTC omeprazole for a couple of weeks with some improvement.  Her mother had a GYN cancer, but she's not sure what it was. Her maternal grandmother had breast cancer.  Her sister recently had a hysterectomy due to uterine fibroids.  Review of Systems As above.    Objective:   Physical Exam  Constitutional: She is oriented to person, place, and time. She appears well-developed and  well-nourished. She is active and cooperative. No distress.  BP 128/80  Pulse 86  Temp(Src) 99.9 F (37.7 C) (Oral)  Resp 14  Ht 5\' 6"  (1.676 m)  Wt 250 lb (113.399 kg)  BMI 40.37 kg/m2  SpO2 96% Hirsute facies.  HENT:  Head: Normocephalic and atraumatic.  Mouth/Throat: No oropharyngeal exudate.  Eyes: Conjunctivae are normal. No scleral icterus.  Neck: Normal range of motion. Neck supple. No thyromegaly present.  Cardiovascular: Normal rate, regular rhythm, normal heart sounds and intact distal pulses.   Pulmonary/Chest: Effort normal and breath sounds normal.  Abdominal: Soft. Bowel sounds are normal. She exhibits no distension and no mass. There is no hepatosplenomegaly. There is tenderness in the epigastric area and suprapubic area. There is no rigidity, no rebound, no guarding, no CVA tenderness, no tenderness at McBurney's point and negative Murphy's sign. No hernia.  Diffuse mild abdominal tenderness, with moderate tenderness of the epigastrum and suprapubic regions.  Lymphadenopathy:    She has no cervical adenopathy.  Neurological: She is alert and oriented to person, place, and time.  Skin: Skin is warm and dry. She is not diaphoretic.  Psychiatric: She has a normal mood and affect. Her behavior is normal.      Results for orders placed in visit on 05/09/14  POCT UA - MICROSCOPIC ONLY      Result Value Ref Range   WBC, Ur, HPF, POC neg     RBC, urine, microscopic 0-1     Bacteria, U Microscopic neg     Mucus, UA neg     Epithelial cells, urine per micros 0-2     Crystals, Ur, HPF, POC neg     Casts, Ur, LPF, POC neg     Yeast, UA neg    POCT URINALYSIS DIPSTICK      Result Value Ref Range   Color, UA yellow     Clarity, UA clear     Glucose, UA >=1000     Bilirubin, UA neg     Ketones, UA neg     Spec Grav, UA <=1.005     Blood, UA neg     pH, UA 5.0     Protein, UA neg     Urobilinogen, UA 0.2     Nitrite, UA neg     Leukocytes, UA Negative     Acute  Abdominal Series: [facility] reading (PRIMARY) by  Dr. Carlota Raspberry.  Mildly elevated RIGHT hemidiaphragm.  Non-specific bowel gas pattern.  Rounded density in the LEFT pelvis which may represent a calcified leiomyoma.  Please compare to pelvic US from 08/2013.      Assessment & Plan:  1. Abdominal pain, unspecified site 2. Abdominal bloating Unclear etiology. Treat for reflux, but concern for gastroparesis and ovarian/uterine malignancy. Await over-read, and consider follow-up US vs. CT scan. Advised 1000 mg/dl glucose in the urine and encouraged her to check her glucose this evening. - POCT UA - Microscopic Only - POCT urinalysis dipstick - DG Abd Acute W/Chest -  pantoprazole (PROTONIX) 40 MG tablet; Take 1 tablet (40 mg total) by mouth daily.  Dispense: 30 tablet; Refill: 3   Fara Chute, PA-C Physician Assistant-Certified [facility]

## 2014-05-11 ENCOUNTER — Telehealth: Payer: Self-pay

## 2014-05-11 NOTE — Telephone Encounter (Signed)
Pt wants xrays from her last visit

## 2014-05-11 NOTE — Telephone Encounter (Signed)
Given to Xray to complete. They contacted patient

## 2014-08-17 ENCOUNTER — Telehealth: Payer: Self-pay

## 2014-08-17 NOTE — Telephone Encounter (Signed)
Clld pt - Spoke to pt about the need to schedule Diabetes Care Follow up appt. Pt advsd that her Diabetes is being followed by Dr. Chalmers Cater at [facility]. Pt was advsd to have current office notes and lab work faxed to this office to be scanned into her chart. Pt states she would.

## 2014-08-25 ENCOUNTER — Ambulatory Visit (INDEPENDENT_AMBULATORY_CARE_PROVIDER_SITE_OTHER): Payer: Managed Care, Other (non HMO) | Admitting: Family Medicine

## 2014-08-25 VITALS — BP 128/76 | HR 76 | Temp 98.6°F | Resp 16 | Ht 66.0 in | Wt 256.4 lb

## 2014-08-25 DIAGNOSIS — E1165 Type 2 diabetes mellitus with hyperglycemia: Secondary | ICD-10-CM

## 2014-08-25 DIAGNOSIS — I10 Essential (primary) hypertension: Secondary | ICD-10-CM

## 2014-08-25 DIAGNOSIS — E282 Polycystic ovarian syndrome: Secondary | ICD-10-CM

## 2014-08-25 DIAGNOSIS — R1011 Right upper quadrant pain: Secondary | ICD-10-CM

## 2014-08-25 DIAGNOSIS — E785 Hyperlipidemia, unspecified: Secondary | ICD-10-CM

## 2014-08-25 DIAGNOSIS — M940 Chondrocostal junction syndrome [Tietze]: Secondary | ICD-10-CM

## 2014-08-25 DIAGNOSIS — IMO0002 Reserved for concepts with insufficient information to code with codable children: Secondary | ICD-10-CM

## 2014-08-25 LAB — POCT URINALYSIS DIPSTICK
Bilirubin, UA: NEGATIVE
Glucose, UA: 1000
KETONES UA: NEGATIVE
Leukocytes, UA: NEGATIVE
Nitrite, UA: NEGATIVE
PH UA: 5
PROTEIN UA: NEGATIVE
RBC UA: NEGATIVE
SPEC GRAV UA: 1.01
Urobilinogen, UA: 0.2

## 2014-08-25 LAB — POCT UA - MICROSCOPIC ONLY
Bacteria, U Microscopic: NEGATIVE
CRYSTALS, UR, HPF, POC: NEGATIVE
Casts, Ur, LPF, POC: NEGATIVE
EPITHELIAL CELLS, URINE PER MICROSCOPY: NEGATIVE
MUCUS UA: NEGATIVE
RBC, urine, microscopic: NEGATIVE
WBC, Ur, HPF, POC: NEGATIVE
YEAST UA: NEGATIVE

## 2014-08-25 MED ORDER — MELOXICAM 15 MG PO TABS: 15.0000 mg | ORAL_TABLET | Freq: Every day | ORAL | Status: DC

## 2014-08-25 MED ORDER — CYCLOBENZAPRINE HCL 10 MG PO TABS: 10.0000 mg | ORAL_TABLET | Freq: Every day | ORAL | Status: DC

## 2014-08-25 NOTE — Patient Instructions (Signed)
Costochondritis Costochondritis, sometimes called Tietze syndrome, is a swelling and irritation (inflammation) of the tissue (cartilage) that connects your ribs with your breastbone (sternum). It causes pain in the chest and rib area. Costochondritis usually goes away on its own over time. It can take up to 6 weeks or longer to get better, especially if you are unable to limit your activities. CAUSES  Some cases of costochondritis have no known cause. Possible causes include:  Injury (trauma).  Exercise or activity such as lifting.  Severe coughing. SIGNS AND SYMPTOMS  Pain and tenderness in the chest and rib area.  Pain that gets worse when coughing or taking deep breaths.  Pain that gets worse with specific movements. DIAGNOSIS  Your health care provider will do a physical exam and ask about your symptoms. Chest X-rays or other tests may be done to rule out other problems. TREATMENT  Costochondritis usually goes away on its own over time. Your health care provider may prescribe medicine to help relieve pain. HOME CARE INSTRUCTIONS   Avoid exhausting physical activity. Try not to strain your ribs during normal activity. This would include any activities using chest, abdominal, and side muscles, especially if heavy weights are used.  Apply ice to the affected area for the first 2 days after the pain begins.  Put ice in a plastic bag.  Place a towel between your skin and the bag.  Leave the ice on for 20 minutes, 2-3 times a day.  Only take over-the-counter or prescription medicines as directed by your health care provider. SEEK MEDICAL CARE IF:  You have redness or swelling at the rib joints. These are signs of infection.  Your pain does not go away despite rest or medicine. SEEK IMMEDIATE MEDICAL CARE IF:   Your pain increases or you are very uncomfortable.  You have shortness of breath or difficulty breathing.  You cough up blood.  You have worse chest pains,  sweating, or vomiting.  You have a fever or persistent symptoms for more than 2-3 days.  You have a fever and your symptoms suddenly get worse. MAKE SURE YOU:   Understand these instructions.  Will watch your condition.  Will get help right away if you are not doing well or get worse. Document Released: 08/12/2005 Document Revised: 08/23/2013 Document Reviewed: 06/06/2013 [employer] Patient Information 2015 [employer]. This information is not intended to replace advice given to you by your health care provider. Make sure you discuss any questions you have with your health care provider.  

## 2014-08-25 NOTE — Progress Notes (Signed)
This chart was scribed for Delman Cheadle, MD by Einar Pheasant, ED Scribe. This patient was seen in room 4 and the patient's care was started at 11:22 AM.  Subjective:    Patient ID: Carol Mueller, female    DOB: 14-Jul-1961, 54 y.o.   MRN: [id number]  Chief Complaint  Patient presents with  . Flank Pain    right side--worse over a week--can't bend over--deep sharp pain--feels tight  . Back Pain    in the middle of the back    HPI Carol Mueller is a 54 y.o. female with a hx of DM, HTN, and Obesity. DM care is being followed by Dr. Domenic Moras at [facility]. Pt is having her metformin and lantis by endocrinology.   Today, pt comes into the office complaining of gradual onset persistent right upper quadrant pain right underneath her right breast which she states is exacerbated by bending over. Carol Mueller states that this pain has been ongoing for the past 1 month. She describes the associated pain as "sharp and tight" in nature. She also endorses associated middle back pain. Pt states that she has a hx of abdominal problems which she went to Dr. Domenic Moras for, she states that after taking the prescribed medication the symptoms went away. She states that the current pain she is in is not associated with her past hx of abdominal problems because the pain does seem like past pain. Pt states that the pain subsides at rest, but is worsened by movement with some occasional radiation to her upper back. Pt has a hx of cholecystectomy. Carol Mueller endorses taking Aleve with minimal relief and 800mg  of Motrin which did help. Denies any nausea, emesis, fever, chills. Pt states that she hasn't had a menstruation in the last 3 months.   Pt's last liver function test was in August. 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    Patient Active Problem List   Diagnosis Date Noted  . Diabetes mellitus 09/12/2012  .  Polycystic disease, ovaries 09/12/2012  . Obesity 09/12/2012  . Hypertension 09/12/2012  . Type II or unspecified type diabetes mellitus without mention of complication, uncontrolled 09/12/2012  . Hyperlipidemia LDL goal < 100 09/12/2012  . 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 Allergic rhinitis 04/06/2012   Past Medical History  Diagnosis Date  . Anemia   . Hypertension   . Polycystic ovary disease   . 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 Diabetes mellitus without complication     insulin-dependent  . Hyperlipidemia    Past Surgical History  Procedure Laterality Date  . Cholecystectomy    . Adrenalectomy  1996   Allergies  Allergen Reactions  . Norvasc [Amlodipine Besylate] Swelling    ANGIOEDEMA   Prior to Admission medications   Medication Sig Start Date End Date Taking? Authorizing Provider  Canagliflozin (INVOKANA) 100 MG TABS Take by mouth.   Yes Historical Provider, MD  carvedilol (COREG) 3.125 MG tablet Take 3.125 mg by mouth 2 (two) times daily with a meal.   Yes Historical Provider, MD  cholecalciferol (VITAMIN D) 400 UNITS TABS Take by mouth.   Yes Historical Provider, MD  Insulin Glargine (LANTUS SOLOSTAR) 100 UNIT/ML SOPN Inject 20 to 40 units into the skin HS. Increase dose by 2 units for readings above 140. PATIENT NEEDS OFFICE VISIT FOR ADDITIONAL REFILLS 09/19/13  Yes  Theda Sers, PA-C  Insulin Pen Needle (B-D ULTRAFINE III SHORT PEN) 31G X 8 MM MISC Use to inject insulin every night. PATIENT NEEDS OFFICE VISIT FOR ADDITIONAL REFILLS 12/11/13  Yes Mancel Bale, PA-C  losartan-hydrochlorothiazide (HYZAAR) 100-25 MG per tablet TAKE 1 TABLET BY MOUTH DAILY. NEEDS OFFICE VISIT 03/20/13  Yes Shawnee Knapp, MD  magnesium gluconate (MAGONATE) 500 MG tablet Take 500 mg by mouth 2 (two) times daily.   Yes Historical Provider, MD  metFORMIN (GLUCOPHAGE) 1000 MG tablet Take 1 tablet (1,000 mg total) by mouth 2 (two) times daily with a meal. PATIENT  NEEDS OFFICE VISIT FOR ADDITIONAL REFILLS 09/07/13  Yes Eleanore E Elana Alm, PA-C  Multiple Vitamins-Minerals (MULTIVITAMIN WITH MINERALS) tablet Take 1 tablet by mouth daily.   Yes Historical Provider, MD  pantoprazole (PROTONIX) 40 MG tablet Take 1 tablet (40 mg total) by mouth daily. 05/09/14  Yes Chelle S Jeffery, PA-C  cetirizine (ZYRTEC) 10 MG tablet Take 1 tablet (10 mg total) by mouth daily. 04/06/12 04/06/13  Shanda Howells, MD   History   Social History  . Marital Status: Single    Spouse Name: N/A    Number of Children: 0  . Years of Education: N/A   Occupational History  . [place] History Main Topics  . Smoking status: Never Smoker   . Smokeless tobacco: Never Used  . Alcohol Use: No  . Drug Use: No  . Sexual Activity: No   Other Topics Concern  . Not on file   Social History Narrative   Lives alone. Parents live in [place].     Review of Systems  Constitutional: Negative for fever.  HENT: Negative for congestion, ear discharge and sinus pressure.   Eyes: Negative for discharge.  Respiratory: Negative for cough.   Cardiovascular: Negative for chest pain.  Gastrointestinal: Positive for abdominal pain. Negative for nausea, vomiting, diarrhea and constipation.  Genitourinary: Negative for frequency and hematuria.  Musculoskeletal: Positive for back pain.  Skin: Negative for rash.  Neurological: Negative for seizures and headaches.  Psychiatric/Behavioral: Negative for hallucinations.      Triage vitals: BP 128/76  Pulse 76  Temp(Src) 98.6 F (37 C) (Oral)  Resp 16  Ht 5\' 6"  (1.676 m)  Wt 256 lb 6.4 oz (116.302 kg)  BMI 41.40 kg/m2  SpO2 95%  Objective:   Physical Exam  Nursing note and vitals reviewed. Constitutional: She appears well-developed and well-nourished. No distress.  HENT:  Head: Normocephalic and atraumatic.  Eyes: Conjunctivae are normal. Right eye exhibits no discharge. Left eye exhibits no discharge.  Neck:  Neck supple.  Cardiovascular: Normal rate, regular rhythm, S1 normal, S2 normal and normal heart sounds.  Exam reveals no gallop and no friction rub.   No murmur heard. Pulmonary/Chest: Effort normal and breath sounds normal. No respiratory distress.  Lungs are nice and clear.  Abdominal: Soft. She exhibits no distension. There is no hepatosplenomegaly, splenomegaly or hepatomegaly. There is tenderness in the epigastric area and suprapubic area. There is no rigidity, no rebound, no guarding, no CVA tenderness and negative Murphy's sign.  Musculoskeletal: She exhibits no edema and no tenderness.  Neurological: She is alert.  Skin: Skin is warm and dry.  No rashes noted.  Psychiatric: She has a normal mood and affect. Her behavior is normal. Thought content normal.     Results for orders placed in visit on 08/25/14  POCT URINALYSIS DIPSTICK      Result Value Ref Range  Color, UA yellow     Clarity, UA clear     Glucose, UA 1000     Bilirubin, UA neg     Ketones, UA neg     Spec Grav, UA 1.010     Blood, UA neg     pH, UA 5.0     Protein, UA neg     Urobilinogen, UA 0.2     Nitrite, UA neg     Leukocytes, UA Negative    POCT UA - MICROSCOPIC ONLY      Result Value Ref Range   WBC, Ur, HPF, POC neg     RBC, urine, microscopic neg     Bacteria, U Microscopic neg     Mucus, UA neg     Epithelial cells, urine per micros neg     Crystals, Ur, HPF, POC neg     Casts, Ur, LPF, POC neg     Yeast, UA neg         Assessment & Plan:  Costochondritis, acute -  Advised pt to apply warm and cold compresses to the affected area. Will prescribe Meloxicam, told pt to not take any OTC medication while on the prescribed medication. Will also prescribe Cyclobenzaprine to take at night. Told her that if her pain worsens she should follow up with [place] so we can discuss imaging plans. Recommended for her to stay away from isolated core activity or weight lifting.   Type II diabetes mellitus,  uncontrolled  Polycystic disease, ovaries  Essential hypertension  Hyperlipidemia with target LDL less than 100  RUQ abdominal pain - Plan: POCT urinalysis dipstick, POCT UA - Microscopic Only  Meds ordered this encounter  Medications  . meloxicam (MOBIC) 15 MG tablet    Sig: Take 1 tablet (15 mg total) by mouth daily.    Dispense:  30 tablet    Refill:  0    Do not use with any other otc pain medication other than tylenol/acetaminophen - so no aleve, ibuprofen, motrin, advil, etc.  . cyclobenzaprine (FLEXERIL) 10 MG tablet    Sig: Take 1 tablet (10 mg total) by mouth at bedtime.    Dispense:  30 tablet    Refill:  0    I personally performed the services described in this documentation, which was scribed in my presence. The recorded information has been reviewed and considered, and addended by me as needed.  Delman Cheadle, MD MPH

## 2014-09-10 ENCOUNTER — Other Ambulatory Visit: Payer: Self-pay | Admitting: Physician Assistant

## 2014-10-31 ENCOUNTER — Other Ambulatory Visit: Payer: Self-pay

## 2014-10-31 DIAGNOSIS — Z1231 Encounter for screening mammogram for malignant neoplasm of breast: Secondary | ICD-10-CM

## 2014-11-19 ENCOUNTER — Ambulatory Visit
Admission: RE | Admit: 2014-11-19 | Discharge: 2014-11-19 | Disposition: A | Discharge status: Home / Self Care | Payer: Managed Care, Other (non HMO) | Source: Ambulatory Visit

## 2014-11-19 DIAGNOSIS — Z1231 Encounter for screening mammogram for malignant neoplasm of breast: Secondary | ICD-10-CM

## 2015-06-30 ENCOUNTER — Ambulatory Visit (INDEPENDENT_AMBULATORY_CARE_PROVIDER_SITE_OTHER): Payer: Managed Care, Other (non HMO) | Admitting: Family Medicine

## 2015-06-30 ENCOUNTER — Ambulatory Visit (INDEPENDENT_AMBULATORY_CARE_PROVIDER_SITE_OTHER): Payer: Managed Care, Other (non HMO)

## 2015-06-30 VITALS — BP 144/82 | HR 99 | Temp 98.8°F | Resp 17 | Ht 64.25 in | Wt 239.4 lb

## 2015-06-30 DIAGNOSIS — M722 Plantar fascial fibromatosis: Secondary | ICD-10-CM

## 2015-06-30 DIAGNOSIS — S93421A Sprain of deltoid ligament of right ankle, initial encounter: Secondary | ICD-10-CM

## 2015-06-30 DIAGNOSIS — M79671 Pain in right foot: Secondary | ICD-10-CM

## 2015-06-30 DIAGNOSIS — M7730 Calcaneal spur, unspecified foot: Secondary | ICD-10-CM

## 2015-06-30 DIAGNOSIS — M25571 Pain in right ankle and joints of right foot: Secondary | ICD-10-CM | POA: Diagnosis not present

## 2015-06-30 MED ORDER — MELOXICAM 15 MG PO TABS
15.0000 mg | ORAL_TABLET | Freq: Every day | ORAL | Status: DC
Start: 2015-06-30 — End: 2015-07-17

## 2015-06-30 NOTE — Patient Instructions (Signed)

## 2015-06-30 NOTE — Progress Notes (Addendum)
Subjective:    Patient ID: Carol Mueller, female    DOB: [birth year], 54 y.o.   MRN: [id number] This chart was scribed for Reginia Forts, MD by Marti Sleigh, Medical Scribe. This patient was seen in Room 3 and the patient's care was started at 10:24 AM.  Chief Complaint  Patient presents with  . Foot Pain    right side     HPI HPI Comments: Carol Mueller is a 54 y.o. female with a past hx of plantar fascitis who presents to [facility] complaining of right foot/heel pain that started two weeks ago and resolved after four days. 8 days ago pain in her right ankle developed, and has worsened since that time. The pt denies abnormal activity, MOI or straining the foot. Pt has taken motrin 800mg  1-2 times per day. She denies numbness, tingling, or burning in legs or feet. Pt has been applying ice to her ankle. Pt has taken mobic in the past with good results. Pt's PCP is Dr. Brigitte Pulse. No swelling of foot or ankle.  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   Review of Systems  Constitutional: Negative for fever, chills, diaphoresis and fatigue.  Musculoskeletal: Positive for arthralgias and gait problem. Negative for joint swelling.  Skin: Negative for rash and wound.  Neurological: Negative for weakness and numbness.       Denies tenderness or burning.   Past Medical History  Diagnosis Date  . Anemia   . Hypertension   . Polycystic ovary disease   . 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 Diabetes mellitus without complication     insulin-dependent  . Hyperlipidemia    Past Surgical History  Procedure Laterality Date  . Cholecystectomy    . Adrenalectomy  1996   Allergies  Allergen Reactions  . Norvasc [Amlodipine Besylate] Swelling    ANGIOEDEMA   Social History   Social History  . Marital Status: Single    Spouse Name: N/A  . Number of Children: 0  . Years of Education: N/A   Occupational History  . [place] History Main Topics  .  Smoking status: Never Smoker   . Smokeless tobacco: Never Used  . Alcohol Use: No  . Drug Use: No  . Sexual Activity: No   Other Topics Concern  . Not on file   Social History Narrative   Lives alone. Parents live in [place].       Objective:   Physical Exam  Constitutional: She is oriented to person, place, and time. She appears well-developed and well-nourished. No distress.  obese  HENT:  Head: Normocephalic and atraumatic.  Eyes: Pupils are equal, round, and reactive to light.  Neck: Neck supple.  Pulmonary/Chest: Effort normal. No respiratory distress.  Musculoskeletal:       Right ankle: She exhibits decreased range of motion. She exhibits no swelling, no ecchymosis and normal pulse. Tenderness. Medial malleolus tenderness found. No head of 5th metatarsal and no proximal fibula tenderness found. Achilles tendon normal.       Right lower leg: Normal.  R ANKLE:  Non tender along achilles. Mild medial malleolus tenderness. No calf tenderness. +painful ROM of R ankle in all directions.   R FOOT: non-tender along metatarsals; metatarsal squeeze negative.  Heel non-tender.  Neurological: She is alert and oriented to person, place, and time. Coordination normal.  Skin: Skin is warm and dry. She is not diaphoretic.  Psychiatric: She has a  normal mood and affect. Her behavior is normal.  Nursing note and vitals reviewed.   Filed Vitals:   06/30/15 0926  BP: 144/82  Pulse: 99  Temp: 98.8 F (37.1 C)  TempSrc: Oral  Resp: 17  Height: 5' 4.25" (1.632 m)  Weight: 239 lb 6.4 oz (108.591 kg)  SpO2: 98%   [facility] reading (PRIMARY) by  Dr. Tamala Julian.  R FOOT: +CALCANEUS SPUR.  R ANKLE: NAD      Assessment & Plan:  Right ankle pain - Plan: DG Ankle Complete Right, DG Foot 2 Views Right  Heel pain, right - Plan: DG Foot 2 Views Right  Sprain, ankle joint, medial, right, initial encounter  Plantar fasciitis  Bone spur of inferior portion of calcaneus   1. R ankle  pain/strain: New.  Recommend rest, icing, elevation. CAM walker provided per patient request; wear for 5 days and then transition to ankle brace.  Home exercise program provided to perform daily.  If no improvement in two weeks, call for ortho referral.  Rx for Mobic provided to replace Ibuprofen. 2.  Plantar fasciitis R/Calcaneus spur R: Recurrent; chronic issue.  Stable at this time.  Change in gait due to recent exacerbation likely etiology to ankle symptoms.    Meds ordered this encounter  Medications  . meloxicam (MOBIC) 15 MG tablet    Sig: Take 1 tablet (15 mg total) by mouth daily.    Dispense:  30 tablet    Refill:  0    I personally performed the services described in this documentation, which was scribed in my presence. The recorded information has been reviewed and considered.  Fidela Cieslak Elayne Guerin, M.D. [facility] [address] [place], [place]  [address] [phone] phone [phone] fax

## 2015-07-15 ENCOUNTER — Inpatient Hospital Stay (HOSPITAL_COMMUNITY): Payer: Managed Care, Other (non HMO)

## 2015-07-15 ENCOUNTER — Encounter (HOSPITAL_COMMUNITY): Payer: Self-pay | Admitting: *Deleted

## 2015-07-15 ENCOUNTER — Inpatient Hospital Stay (HOSPITAL_COMMUNITY)
Admission: EM | Admit: 2015-07-15 | Discharge: 2015-07-17 | DRG: 394 | Disposition: A | Discharge status: Home / Self Care | Payer: Managed Care, Other (non HMO) | Attending: Internal Medicine | Admitting: Internal Medicine

## 2015-07-15 DIAGNOSIS — E1159 Type 2 diabetes mellitus with other circulatory complications: Secondary | ICD-10-CM | POA: Diagnosis present

## 2015-07-15 DIAGNOSIS — Z791 Long term (current) use of non-steroidal anti-inflammatories (NSAID): Secondary | ICD-10-CM

## 2015-07-15 DIAGNOSIS — Z888 Allergy status to other drugs, medicaments and biological substances status: Secondary | ICD-10-CM

## 2015-07-15 DIAGNOSIS — M722 Plantar fascial fibromatosis: Secondary | ICD-10-CM | POA: Diagnosis present

## 2015-07-15 DIAGNOSIS — Z8249 Family history of ischemic heart disease and other diseases of the circulatory system: Secondary | ICD-10-CM | POA: Diagnosis not present

## 2015-07-15 DIAGNOSIS — IMO0002 Reserved for concepts with insufficient information to code with codable children: Secondary | ICD-10-CM | POA: Diagnosis present

## 2015-07-15 DIAGNOSIS — K559 Vascular disorder of intestine, unspecified: Principal | ICD-10-CM | POA: Diagnosis present

## 2015-07-15 DIAGNOSIS — A09 Infectious gastroenteritis and colitis, unspecified: Secondary | ICD-10-CM

## 2015-07-15 DIAGNOSIS — R000 Tachycardia, unspecified: Secondary | ICD-10-CM | POA: Diagnosis present

## 2015-07-15 DIAGNOSIS — E249 Cushing's syndrome, unspecified: Secondary | ICD-10-CM | POA: Diagnosis present

## 2015-07-15 DIAGNOSIS — E860 Dehydration: Secondary | ICD-10-CM | POA: Diagnosis present

## 2015-07-15 DIAGNOSIS — Z833 Family history of diabetes mellitus: Secondary | ICD-10-CM

## 2015-07-15 DIAGNOSIS — Z803 Family history of malignant neoplasm of breast: Secondary | ICD-10-CM

## 2015-07-15 DIAGNOSIS — M25571 Pain in right ankle and joints of right foot: Secondary | ICD-10-CM | POA: Diagnosis present

## 2015-07-15 DIAGNOSIS — Z79899 Other long term (current) drug therapy: Secondary | ICD-10-CM

## 2015-07-15 DIAGNOSIS — Z6841 Body Mass Index (BMI) 40.0 and over, adult: Secondary | ICD-10-CM

## 2015-07-15 DIAGNOSIS — T39395A Adverse effect of other nonsteroidal anti-inflammatory drugs [NSAID], initial encounter: Secondary | ICD-10-CM | POA: Diagnosis present

## 2015-07-15 DIAGNOSIS — M25579 Pain in unspecified ankle and joints of unspecified foot: Secondary | ICD-10-CM | POA: Diagnosis present

## 2015-07-15 DIAGNOSIS — E282 Polycystic ovarian syndrome: Secondary | ICD-10-CM | POA: Diagnosis present

## 2015-07-15 DIAGNOSIS — Z794 Long term (current) use of insulin: Secondary | ICD-10-CM | POA: Diagnosis not present

## 2015-07-15 DIAGNOSIS — E785 Hyperlipidemia, unspecified: Secondary | ICD-10-CM | POA: Diagnosis present

## 2015-07-15 DIAGNOSIS — D72829 Elevated white blood cell count, unspecified: Secondary | ICD-10-CM | POA: Diagnosis present

## 2015-07-15 DIAGNOSIS — Z8041 Family history of malignant neoplasm of ovary: Secondary | ICD-10-CM | POA: Diagnosis not present

## 2015-07-15 DIAGNOSIS — I10 Essential (primary) hypertension: Secondary | ICD-10-CM | POA: Diagnosis present

## 2015-07-15 DIAGNOSIS — D62 Acute posthemorrhagic anemia: Secondary | ICD-10-CM | POA: Diagnosis present

## 2015-07-15 DIAGNOSIS — R197 Diarrhea, unspecified: Secondary | ICD-10-CM

## 2015-07-15 DIAGNOSIS — R109 Unspecified abdominal pain: Secondary | ICD-10-CM | POA: Diagnosis not present

## 2015-07-15 DIAGNOSIS — K921 Melena: Secondary | ICD-10-CM | POA: Insufficient documentation

## 2015-07-15 DIAGNOSIS — R1013 Epigastric pain: Secondary | ICD-10-CM | POA: Diagnosis not present

## 2015-07-15 DIAGNOSIS — E1165 Type 2 diabetes mellitus with hyperglycemia: Secondary | ICD-10-CM | POA: Diagnosis present

## 2015-07-15 DIAGNOSIS — K219 Gastro-esophageal reflux disease without esophagitis: Secondary | ICD-10-CM | POA: Diagnosis present

## 2015-07-15 DIAGNOSIS — I152 Hypertension secondary to endocrine disorders: Secondary | ICD-10-CM | POA: Diagnosis present

## 2015-07-15 DIAGNOSIS — K5730 Diverticulosis of large intestine without perforation or abscess without bleeding: Secondary | ICD-10-CM | POA: Diagnosis present

## 2015-07-15 DIAGNOSIS — K922 Gastrointestinal hemorrhage, unspecified: Secondary | ICD-10-CM | POA: Diagnosis present

## 2015-07-15 LAB — CBC
HCT: 40.1 % (ref 36.0–46.0)
Hemoglobin: 12.7 g/dL (ref 12.0–15.0)
MCH: 21.7 pg — AB (ref 26.0–34.0)
MCHC: 31.7 g/dL (ref 30.0–36.0)
MCV: 68.5 fL — AB (ref 78.0–100.0)
PLATELETS: 342 10*3/uL (ref 150–400)
RBC: 5.85 MIL/uL — ABNORMAL HIGH (ref 3.87–5.11)
RDW: 16.3 % — AB (ref 11.5–15.5)
WBC: 16.5 10*3/uL — ABNORMAL HIGH (ref 4.0–10.5)

## 2015-07-15 LAB — COMPREHENSIVE METABOLIC PANEL
ALT: 34 U/L (ref 14–54)
AST: 31 U/L (ref 15–41)
Albumin: 3.8 g/dL (ref 3.5–5.0)
Alkaline Phosphatase: 50 U/L (ref 38–126)
Anion gap: 12 (ref 5–15)
BUN: 25 mg/dL — AB (ref 6–20)
CHLORIDE: 103 mmol/L (ref 101–111)
CO2: 21 mmol/L — AB (ref 22–32)
CREATININE: 1.05 mg/dL — AB (ref 0.44–1.00)
Calcium: 9.1 mg/dL (ref 8.9–10.3)
GFR calc Af Amer: >60 mL/min (ref >60)
GFR calc non Af Amer: 59 mL/min — ABNORMAL LOW (ref >60)
GLUCOSE: 217 mg/dL — AB (ref 65–99)
Potassium: 3.8 mmol/L (ref 3.5–5.1)
SODIUM: 136 mmol/L (ref 135–145)
Total Bilirubin: 0.4 mg/dL (ref 0.3–1.2)
Total Protein: 7.6 g/dL (ref 6.5–8.1)

## 2015-07-15 LAB — POC OCCULT BLOOD, ED: FECAL OCCULT BLD: POSITIVE — AB

## 2015-07-15 LAB — ABO/RH: ABO/RH(D): O POS

## 2015-07-15 LAB — GLUCOSE, CAPILLARY
GLUCOSE-CAPILLARY: 124 mg/dL — AB (ref 65–99)
Glucose-Capillary: 145 mg/dL — ABNORMAL HIGH (ref 65–99)

## 2015-07-15 LAB — TYPE AND SCREEN
ABO/RH(D): O POS
Antibody Screen: NEGATIVE

## 2015-07-15 LAB — MRSA PCR SCREENING: MRSA BY PCR: NEGATIVE

## 2015-07-15 MED ORDER — SODIUM CHLORIDE 0.9 % IV SOLN
INTRAVENOUS | Status: DC
  Administered 2015-07-15: 16:00:00 via INTRAVENOUS

## 2015-07-15 MED ORDER — ACETAMINOPHEN 325 MG PO TABS: 650.0000 mg | ORAL_TABLET | Freq: Four times a day (QID) | ORAL | Status: DC | PRN

## 2015-07-15 MED ORDER — CARVEDILOL 3.125 MG PO TABS
3.1250 mg | ORAL_TABLET | Freq: Two times a day (BID) | ORAL | Status: DC
  Administered 2015-07-16 – 2015-07-17 (×3): 3.125 mg via ORAL
  Filled 2015-07-15 (×5): qty 1

## 2015-07-15 MED ORDER — IOHEXOL 300 MG/ML  SOLN
50.0000 mL | Freq: Once | INTRAMUSCULAR | Status: AC | PRN
  Administered 2015-07-15: 50 mL via ORAL

## 2015-07-15 MED ORDER — HYDROCODONE-ACETAMINOPHEN 5-325 MG PO TABS: 1.0000 | ORAL_TABLET | ORAL | Status: DC | PRN

## 2015-07-15 MED ORDER — ONDANSETRON HCL 4 MG PO TABS: 4.0000 mg | ORAL_TABLET | Freq: Four times a day (QID) | ORAL | Status: DC | PRN

## 2015-07-15 MED ORDER — CIPROFLOXACIN IN D5W 400 MG/200ML IV SOLN
400.0000 mg | Freq: Two times a day (BID) | INTRAVENOUS | Status: DC
  Administered 2015-07-15 – 2015-07-17 (×4): 400 mg via INTRAVENOUS
  Filled 2015-07-15 (×4): qty 200

## 2015-07-15 MED ORDER — ONDANSETRON HCL 4 MG/2ML IJ SOLN: 4.0000 mg | Freq: Four times a day (QID) | INTRAMUSCULAR | Status: DC | PRN

## 2015-07-15 MED ORDER — SODIUM CHLORIDE 0.9 % IV SOLN
8.0000 mg/h | INTRAVENOUS | Status: DC
  Administered 2015-07-15 – 2015-07-16 (×2): 8 mg/h via INTRAVENOUS
  Filled 2015-07-15 (×4): qty 80

## 2015-07-15 MED ORDER — INSULIN ASPART 100 UNIT/ML ~~LOC~~ SOLN
0.0000 [IU] | SUBCUTANEOUS | Status: DC
  Administered 2015-07-15 – 2015-07-16 (×6): 2 [IU] via SUBCUTANEOUS
  Administered 2015-07-16: 5 [IU] via SUBCUTANEOUS
  Administered 2015-07-17: 1 [IU] via SUBCUTANEOUS
  Administered 2015-07-17 (×3): 2 [IU] via SUBCUTANEOUS

## 2015-07-15 MED ORDER — HYDROMORPHONE HCL 1 MG/ML IJ SOLN
1.0000 mg | INTRAMUSCULAR | Status: DC | PRN
  Administered 2015-07-15: 0.5 mg via INTRAVENOUS
  Administered 2015-07-16: 0.25 mg via INTRAVENOUS
  Filled 2015-07-15 (×2): qty 1

## 2015-07-15 MED ORDER — ACETAMINOPHEN 650 MG RE SUPP: 650.0000 mg | Freq: Four times a day (QID) | RECTAL | Status: DC | PRN

## 2015-07-15 MED ORDER — METRONIDAZOLE IN NACL 5-0.79 MG/ML-% IV SOLN
500.0000 mg | Freq: Three times a day (TID) | INTRAVENOUS | Status: DC
  Administered 2015-07-16 – 2015-07-17 (×4): 500 mg via INTRAVENOUS
  Filled 2015-07-15 (×4): qty 100

## 2015-07-15 MED ORDER — PANTOPRAZOLE SODIUM 40 MG IV SOLR
80.0000 mg | Freq: Once | INTRAVENOUS | Status: AC
  Administered 2015-07-15: 80 mg via INTRAVENOUS
  Filled 2015-07-15: qty 80

## 2015-07-15 MED ORDER — IOHEXOL 300 MG/ML  SOLN
100.0000 mL | Freq: Once | INTRAMUSCULAR | Status: AC | PRN
  Administered 2015-07-15: 100 mL via INTRAVENOUS

## 2015-07-15 MED ORDER — SODIUM CHLORIDE 0.9 % IV SOLN
INTRAVENOUS | Status: DC
  Administered 2015-07-15: 15:00:00 via INTRAVENOUS

## 2015-07-15 MED ORDER — INFLUENZA VAC SPLIT QUAD 0.5 ML IM SUSY
0.5000 mL | PREFILLED_SYRINGE | INTRAMUSCULAR | Status: AC
  Administered 2015-07-16: 0.5 mL via INTRAMUSCULAR
  Filled 2015-07-15 (×2): qty 0.5

## 2015-07-15 MED ORDER — PANTOPRAZOLE SODIUM 40 MG IV SOLR: 40.0000 mg | Freq: Two times a day (BID) | INTRAVENOUS | Status: DC

## 2015-07-15 NOTE — ED Notes (Signed)
Bed: EE10 Expected date:  Expected time:  Means of arrival:  Comments: Ems- GI bleed

## 2015-07-15 NOTE — ED Provider Notes (Signed)
CSN: [id number]     Arrival date & time 07/15/15  1212 History   First MD Initiated Contact with Patient 07/15/15 1241     Chief Complaint  Patient presents with  . Blood In Stools     (Consider location/radiation/quality/duration/timing/severity/associated sxs/prior Treatment) HPI Comments: Patient here complaining of rectal bleeding which began today characterized as dark stool mixed with blood. History of upper GI the past secondary to NSAID use. She has been on NSAIDs for the past 2 weeks. Denies any fever or chills. No vomiting noted. Does have some vague upper abdominal discomfort. Now complains of feeling weak and tired. Symptoms have been progressively worse since this morning. Nothing makes them better. No treatment use prior to arrival  The history is provided by the patient.    Past Medical History  Diagnosis Date  . Anemia   . Hypertension   . Polycystic ovary disease   . 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 Diabetes mellitus without complication     insulin-dependent  . Hyperlipidemia    Past Surgical History  Procedure Laterality Date  . Cholecystectomy    . Adrenalectomy  1996   Family History  Problem Relation Age of Onset  . Hypertension Mother   . Cancer Mother     ?uterine or ovarian cancer  . Hypertension Father   . Diabetes Father   . Hypertension Sister   . Diabetes Sister   . Hypertension Brother   . Diabetes Brother   . Hypertension Maternal Grandfather   . Diabetes Paternal Grandmother   . Colon cancer Neg Hx   . Cancer Maternal Grandmother     breast cancer   Social History  Substance Use Topics  . Smoking status: Never Smoker   . Smokeless tobacco: Never Used  . Alcohol Use: No   OB History    No data available     Review of Systems  All other systems reviewed and are negative.     Allergies  Norvasc  Home Medications   Prior to Admission medications   Medication Sig Start Date End Date Taking? Authorizing  Provider  Canagliflozin (INVOKANA) 100 MG TABS Take by mouth.    Historical Provider, MD  carvedilol (COREG) 3.125 MG tablet Take 3.125 mg by mouth 2 (two) times daily with a meal.    Historical Provider, MD  cholecalciferol (VITAMIN D) 400 UNITS TABS Take by mouth.    Historical Provider, MD  Insulin Glargine (LANTUS SOLOSTAR) 100 UNIT/ML SOPN Inject 20 to 40 units into the skin HS. Increase dose by 2 units for readings above 140. PATIENT NEEDS OFFICE VISIT FOR ADDITIONAL REFILLS 09/19/13   Theda Sers, PA-C  Insulin Pen Needle (B-D ULTRAFINE III SHORT PEN) 31G X 8 MM MISC Use to inject insulin every night. PATIENT NEEDS OFFICE VISIT FOR ADDITIONAL REFILLS 12/11/13   Mancel Bale, PA-C  losartan-hydrochlorothiazide (HYZAAR) 100-25 MG per tablet TAKE 1 TABLET BY MOUTH DAILY. NEEDS OFFICE VISIT 03/20/13   Shawnee Knapp, MD  meloxicam (MOBIC) 15 MG tablet Take 1 tablet (15 mg total) by mouth daily. 06/30/15   Wardell Honour, MD  metFORMIN (GLUCOPHAGE) 1000 MG tablet Take 1 tablet (1,000 mg total) by mouth 2 (two) times daily with a meal. PATIENT NEEDS OFFICE VISIT FOR ADDITIONAL REFILLS 09/07/13   Theda Sers, PA-C  Multiple Vitamins-Minerals (MULTIVITAMIN WITH MINERALS) tablet Take 1 tablet by mouth daily.    Historical Provider, MD  pantoprazole (PROTONIX) 40  MG tablet TAKE 1 TABLET (40 MG TOTAL) BY MOUTH DAILY. 09/11/14   Mancel Bale, PA-C   BP 125/79 mmHg  Pulse 120  Temp(Src) 98.4 F (36.9 C) (Oral)  Resp 20  SpO2 95% Physical Exam  Constitutional: She is oriented to person, place, and time. She appears well-developed and well-nourished.  Non-toxic appearance. No distress.  HENT:  Head: Normocephalic and atraumatic.  Eyes: Conjunctivae, EOM and lids are normal. Pupils are equal, round, and reactive to light.  Neck: Normal range of motion. Neck supple. No tracheal deviation present. No thyroid mass present.  Cardiovascular: Normal rate, regular rhythm and normal heart sounds.  Exam  reveals no gallop.   No murmur heard. Pulmonary/Chest: Effort normal and breath sounds normal. No stridor. No respiratory distress. She has no decreased breath sounds. She has no wheezes. She has no rhonchi. She has no rales.  Abdominal: Soft. Normal appearance and bowel sounds are normal. She exhibits no distension. There is no rigidity, no rebound, no guarding and no CVA tenderness.  Genitourinary:  Per nursing, gross blood noted per rectum  Musculoskeletal: Normal range of motion. She exhibits no edema or tenderness.  Neurological: She is alert and oriented to person, place, and time. She has normal strength. No cranial nerve deficit or sensory deficit. GCS eye subscore is 4. GCS verbal subscore is 5. GCS motor subscore is 6.  Skin: Skin is warm and dry. No abrasion and no rash noted.  Psychiatric: She has a normal mood and affect. Her speech is normal and behavior is normal.  Nursing note and vitals reviewed.   ED Course  Procedures (including critical care time) Labs Review Labs Reviewed  COMPREHENSIVE METABOLIC PANEL  CBC  POC OCCULT BLOOD, ED  TYPE AND SCREEN    Imaging Review No results found. I have personally reviewed and evaluated these images and lab results as part of my medical decision-making.   EKG Interpretation   Date/Time:  Monday July 15 2015 12:28:56 EDT Ventricular Rate:  119 PR Interval:  129 QRS Duration: 73 QT Interval:  317 QTC Calculation: 446 R Axis:   23 Text Interpretation:  Sinus tachycardia Borderline ST depression, diffuse  leads Confirmed by Zenia Resides  MD, Rondi Ivy ([id number]) on 07/15/2015 12:50:46 PM      MDM   Final diagnoses:  None   Patient given IV fluids for hypotension. Likely from GI bleed. Packed red blood cells have been ordered. Patient is maintaining appropriate this time. Will be admitted to medicine     Lacretia Leigh, MD 07/19/15 1452

## 2015-07-15 NOTE — H&P (Signed)
History and Physical        Hospital Admission Note Date: 07/15/2015  Patient name: Carol Mueller Medical record number: [id number] Date of birth: 1961/09/28 Age: 54 y.o. Gender: female  PCP: Delman Cheadle, MD  Referring physician: Dr Zenia Resides  Chief Complaint:  Rectal bleeding  HPI: Patient is a 54 year old female with hypertension, chronic anemia, insulin-dependent diabetes mellitus, hyperlipidemia presented to ED with multiple episodes of GI bleeding. History was obtained from the patient who reported that she had noticed abdominal cramping with gas pains in the last 2-3 days. This morning, patient went to work around 8 AM and had a large bloody bowel movement after breakfast. She had worsening abdominal cramping but no nausea, vomiting or any fevers or chills. Subsequently patient came home and had another 2 bloody bowel movements, described as dark red blood. She felt dizzy and short of breath on walking, hence came to the ER via EMS for evaluation. In the ED, patient had subsequently 2 more bloody bowel movements. Patient reported that she had a recent ankle sprain and plantar fasciitis. She was started on meloxicam by the urgent care. Patient reports that she has had prior GI bleeding whenever she took NSAIDs but 'never this bad'. CBC currently stable with WBC 16.5 hemoglobin 12.7, hematocrit 40.1, no prior baseline H&H, FOBT positive. BMET unremarkable tried hospitalist service requested for admission for GI bleeding..   Review of Systems:  Constitutional: Denies fever, chills, diaphoresis, poor appetite and fatigue.  HEENT: Denies photophobia, eye pain, redness, hearing loss, ear pain, congestion, sore throat, rhinorrhea, sneezing, mouth sores, trouble swallowing, neck pain, neck stiffness and tinnitus.   Respiratory: Denies SOB, +DOE today,  denies cough, chest tightness,  and  wheezing.   Cardiovascular: Denies chest pain, palpitations and leg swelling.  Gastrointestinal: please see history of present illness  Genitourinary: Denies dysuria, urgency, frequency, hematuria, flank pain and difficulty urinating.  Musculoskeletal: Denies myalgias, back pain, joint swelling, arthralgias and gait problem.  Skin: Denies pallor, rash and wound.  Neurological: Denies seizures, syncope, numbness and headaches. + dizziness and lightheadedness  Hematological: Denies adenopathy. Easy bruising, personal or family bleeding history  Psychiatric/Behavioral: Denies suicidal ideation, mood changes, confusion, nervousness, sleep disturbance and agitation  Past Medical History: Past Medical History  Diagnosis Date  . Anemia   . Hypertension   . Polycystic ovary disease   . 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 Diabetes mellitus without complication     insulin-dependent  . Hyperlipidemia     Past Surgical History  Procedure Laterality Date  . Cholecystectomy    . Adrenalectomy  1996    Medications: Prior to Admission medications   Medication Sig Start Date End Date Taking? Authorizing Provider  Canagliflozin (INVOKANA) 100 MG TABS Take 100 mg by mouth daily.    Yes Historical Provider, MD  carvedilol (COREG) 3.125 MG tablet Take 3.125 mg by mouth 2 (two) times daily with a meal.   Yes Historical Provider, MD  Insulin Glargine (LANTUS SOLOSTAR) 100 UNIT/ML SOPN Inject 20 to 40 units into the skin HS. Increase dose by 2 units for readings above 140. PATIENT NEEDS OFFICE VISIT FOR ADDITIONAL REFILLS  09/19/13  Yes Eleanore E Egan, PA-C  losartan-hydrochlorothiazide (HYZAAR) 100-25 MG per tablet TAKE 1 TABLET BY MOUTH DAILY. NEEDS OFFICE VISIT 03/20/13  Yes Shawnee Knapp, MD  meloxicam (MOBIC) 15 MG tablet Take 1 tablet (15 mg total) by mouth daily. 06/30/15  Yes Wardell Honour, MD  metFORMIN (GLUCOPHAGE) 1000 MG tablet Take 1 tablet (1,000 mg total) by mouth 2 (two) times  daily with a meal. PATIENT NEEDS OFFICE VISIT FOR ADDITIONAL REFILLS 09/07/13  Yes Theda Sers, PA-C  Multiple Vitamins-Minerals (MULTIVITAMIN WITH MINERALS) tablet Take 1 tablet by mouth daily.   Yes Historical Provider, MD  Omega 3 1000 MG CAPS Take 1,000 mg by mouth daily.   Yes Historical Provider, MD  pantoprazole (PROTONIX) 40 MG tablet TAKE 1 TABLET (40 MG TOTAL) BY MOUTH DAILY. 09/11/14  Yes Mancel Bale, PA-C  Insulin Pen Needle (B-D ULTRAFINE III SHORT PEN) 31G X 8 MM MISC Use to inject insulin every night. PATIENT NEEDS OFFICE VISIT FOR ADDITIONAL REFILLS 12/11/13   Mancel Bale, PA-C    Allergies:   Allergies  Allergen Reactions  . Norvasc [Amlodipine Besylate] Swelling    ANGIOEDEMA    Social History:  reports that she has never smoked. She has never used smokeless tobacco. She reports that she does not drink alcohol or use illicit drugs.  Family History: Family History  Problem Relation Age of Onset  . Hypertension Mother   . Cancer Mother     ?uterine or ovarian cancer  . Hypertension Father   . Diabetes Father   . Hypertension Sister   . Diabetes Sister   . Hypertension Brother   . Diabetes Brother   . Hypertension Maternal Grandfather   . Diabetes Paternal Grandmother   . Colon cancer Neg Hx   . Cancer Maternal Grandmother     breast cancer    Physical Exam: Blood pressure 111/68, pulse 113, temperature 98.4 F (36.9 C), temperature source Oral, resp. rate 20, SpO2 96 %. General: Alert, awake, oriented x3, in no acute distress. HEENT: normocephalic, atraumatic, anicteric sclera, pink conjunctiva, pupils equal and reactive to light and accomodation, oropharynx clear Neck: supple, no masses or lymphadenopathy, no goiter, no bruits  Heart: Regular rate and rhythm, without murmurs, rubs or gallops. Lungs: Clear to auscultation bilaterally, no wheezing, rales or rhonchi. Abdomen: Soft, nontender, nondistended, positive bowel sounds, no  masses. Extremities: No clubbing, cyanosis or edema with positive pedal pulses. Neuro: Grossly intact, no focal neurological deficits, strength 5/5 upper and lower extremities bilaterally Psych: alert and oriented x 3, normal mood and affect Skin: no rashes or lesions, warm and dry   LABS on Admission:  Basic Metabolic Panel:  Recent Labs Lab 07/15/15 1247  NA 136  K 3.8  CL 103  CO2 21*  GLUCOSE 217*  BUN 25*  CREATININE 1.05*  CALCIUM 9.1   Liver Function Tests:  Recent Labs Lab 07/15/15 1247  AST 31  ALT 34  ALKPHOS 50  BILITOT 0.4  PROT 7.6  ALBUMIN 3.8   No results for input(s): LIPASE, AMYLASE in the last 168 hours. No results for input(s): AMMONIA in the last 168 hours. CBC:  Recent Labs Lab 07/15/15 1247  WBC 16.5*  HGB 12.7  HCT 40.1  MCV 68.5*  PLT 342   Cardiac Enzymes: No results for input(s): CKTOTAL, CKMB, CKMBINDEX, TROPONINI in the last 168 hours. BNP: Invalid input(s): POCBNP CBG: No results for input(s): GLUCAP in the last 168 hours.  Radiological Exams  on Admission:  Dg Ankle Complete Right  06/30/2015   CLINICAL DATA:  Right foot and ankle pain, history of plantar fasciitis  EXAM: RIGHT ANKLE - COMPLETE 3+ VIEW  COMPARISON:  None.  FINDINGS: No acute fracture or dislocation is noted. No soft tissue changes are seen. A small plantar calcaneal spur is noted.  IMPRESSION: No acute abnormality seen.   Electronically Signed   By: Inez Catalina M.D.   On: 06/30/2015 14:41   Dg Foot 2 Views Right  06/30/2015   CLINICAL DATA:  Right foot pain, no known injury, initial encounter  EXAM: RIGHT FOOT - 2 VIEW  COMPARISON:  None.  FINDINGS: No acute fracture or dislocation is noted. Some mild cortical thickening is noted in the second through fourth metatarsals which may be related to stress fractures and healing. A small calcaneal spur is noted. No soft tissue changes are noted.  IMPRESSION: Possible healed stress fractures as described. No acute  abnormality noted.   Electronically Signed   By: Inez Catalina M.D.   On: 06/30/2015 14:43    *I have personally reviewed the images above*     Assessment/Plan Principal Problem:   GI bleed: Likely precipitated due to recent NSAIDs, known history of GERD. Patient has prior NSAID related to GI bleeds in the past. Prior colonoscopy in 2014 had shown moderate diverticulosis in the descending and sigmoid colon by Dr. Sharlett Iles. - Admit to stepdown due to active GI bleeding, tachycardia - Placed on IV fluids, IV Protonix drip, NPO - GI consult called, awaiting response  Active Problems:   Hypertension - Currently BP stable, will continue Coreg for now, hold off on losartan and HCTZ    Type II diabetes mellitus, uncontrolled - Given patient NPO, place on sliding scale insulin only - Obtain hemoglobin A1c    Tachycardia - Likely due to dehydration, active GI bleeding, placed on IV fluids and continue Coreg   Right ankle pain: Currently resolved  DVT prophylaxis: SCDs   CODE STATUS: Full code   Family Communication: Admission, patients condition and plan of care including tests being ordered have been discussed with the patient and family members ( mother, sister) who indicates understanding and agree with the plan and Code Status  Disposition plan: Further plan will depend as patient's clinical course evolves and further radiologic and laboratory data become available.    Time Spent on Admission: 60 mins   RAI,RIPUDEEP M.D. [facility] 07/15/2015, 2:39 PM Pager: [phone]  If 7PM-7AM, please contact night-coverage [email or web address] Password [email or web address]

## 2015-07-15 NOTE — ED Notes (Signed)
Blood Occult POS RN notified DR Zenia Resides Notified

## 2015-07-15 NOTE — ED Notes (Signed)
Attempted to call report, rn unavailable at this time, left call back number

## 2015-07-15 NOTE — Consult Note (Signed)
Consultation  Referring Provider: [facility]     Primary Care Physician:  Delman Cheadle, MD Primary Gastroenterologist:   Former patient of Dr. Sharlett Iles      Reason for Consultation:  GI bleed             HPI:   Carol Mueller is a 54 y.o. female in ED with hematochezia. After breakfast this am she developed lower abdominal cramps followed by a loose bloody BM. Patient had about five similar episodes in total. She has been taking NSAIDS (Motrin up until 2 weeks ago then started Mobic). No nausea or vomiting. No history of PUD.     Past Medical History  Diagnosis Date  . Anemia   . Hypertension   . Polycystic ovary disease   . 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 Diabetes mellitus without complication     insulin-dependent  . Hyperlipidemia     Past Surgical History  Procedure Laterality Date  . Cholecystectomy    . Adrenalectomy  1996    Family History  Problem Relation Age of Onset  . Hypertension Mother   . Cancer Mother     ?uterine or ovarian cancer  . Hypertension Father   . Diabetes Father   . Hypertension Sister   . Diabetes Sister   . Hypertension Brother   . Diabetes Brother   . Hypertension Maternal Grandfather   . Diabetes Paternal Grandmother   . Colon cancer Neg Hx   . Cancer Maternal Grandmother     breast cancer     Social History  Substance Use Topics  . Smoking status: Never Smoker   . Smokeless tobacco: Never Used  . Alcohol Use: No    Prior to Admission medications   Medication Sig Start Date End Date Taking? Authorizing Provider  Canagliflozin (INVOKANA) 100 MG TABS Take 100 mg by mouth daily.    Yes Historical Provider, MD  carvedilol (COREG) 3.125 MG tablet Take 3.125 mg by mouth 2 (two) times daily with a meal.   Yes Historical Provider, MD  Insulin Glargine (LANTUS SOLOSTAR) 100 UNIT/ML SOPN Inject 20 to 40 units into the skin HS. Increase dose by 2 units for readings above 140. PATIENT NEEDS OFFICE VISIT FOR  ADDITIONAL REFILLS 09/19/13  Yes Eleanore E Egan, PA-C  losartan-hydrochlorothiazide (HYZAAR) 100-25 MG per tablet TAKE 1 TABLET BY MOUTH DAILY. NEEDS OFFICE VISIT 03/20/13  Yes Shawnee Knapp, MD  meloxicam (MOBIC) 15 MG tablet Take 1 tablet (15 mg total) by mouth daily. 06/30/15  Yes Wardell Honour, MD  metFORMIN (GLUCOPHAGE) 1000 MG tablet Take 1 tablet (1,000 mg total) by mouth 2 (two) times daily with a meal. PATIENT NEEDS OFFICE VISIT FOR ADDITIONAL REFILLS 09/07/13  Yes Theda Sers, PA-C  Multiple Vitamins-Minerals (MULTIVITAMIN WITH MINERALS) tablet Take 1 tablet by mouth daily.   Yes Historical Provider, MD  Omega 3 1000 MG CAPS Take 1,000 mg by mouth daily.   Yes Historical Provider, MD  pantoprazole (PROTONIX) 40 MG tablet TAKE 1 TABLET (40 MG TOTAL) BY MOUTH DAILY. 09/11/14  Yes Mancel Bale, PA-C  Insulin Pen Needle (B-D ULTRAFINE III SHORT PEN) 31G X 8 MM MISC Use to inject insulin every night. PATIENT NEEDS OFFICE VISIT FOR ADDITIONAL REFILLS 12/11/13   Mancel Bale, PA-C    Current Facility-Administered Medications  Medication Dose Route Frequency Provider Last Rate Last Dose  . 0.9 %  sodium chloride infusion  Intravenous Continuous Ripudeep K Rai, MD      . 0.9 %  sodium chloride infusion   Intravenous Continuous Ripudeep Krystal Eaton, MD 100 mL/hr at 07/15/15 1517    . acetaminophen (TYLENOL) tablet 650 mg  650 mg Oral Q6H PRN Ripudeep Krystal Eaton, MD       Or  . acetaminophen (TYLENOL) suppository 650 mg  650 mg Rectal Q6H PRN Ripudeep K Rai, MD      . carvedilol (COREG) tablet 3.125 mg  3.125 mg Oral BID WC Ripudeep K Rai, MD      . HYDROcodone-acetaminophen (NORCO/VICODIN) 5-325 MG per tablet 1-2 tablet  1-2 tablet Oral Q4H PRN Ripudeep K Rai, MD      . HYDROmorphone (DILAUDID) injection 1 mg  1 mg Intravenous Q4H PRN Ripudeep K Rai, MD      . insulin aspart (novoLOG) injection 0-15 Units  0-15 Units Subcutaneous 6 times per day Ripudeep Krystal Eaton, MD      . ondansetron (ZOFRAN) tablet 4 mg   4 mg Oral Q6H PRN Ripudeep Krystal Eaton, MD       Or  . ondansetron (ZOFRAN) injection 4 mg  4 mg Intravenous Q6H PRN Ripudeep K Rai, MD      . pantoprazole (PROTONIX) 80 mg in sodium chloride 0.9 % 100 mL IVPB  80 mg Intravenous Once Ripudeep K Rai, MD 300 mL/hr at 07/15/15 1517 80 mg at 07/15/15 1517  . pantoprazole (PROTONIX) 80 mg in sodium chloride 0.9 % 250 mL (0.32 mg/mL) infusion  8 mg/hr Intravenous Continuous Ripudeep K Rai, MD      . Derrill Memo ON 07/19/2015] pantoprazole (PROTONIX) injection 40 mg  40 mg Intravenous Q12H Ripudeep Krystal Eaton, MD       Current Outpatient Prescriptions  Medication Sig Dispense Refill  . Canagliflozin (INVOKANA) 100 MG TABS Take 100 mg by mouth daily.     . carvedilol (COREG) 3.125 MG tablet Take 3.125 mg by mouth 2 (two) times daily with a meal.    . Insulin Glargine (LANTUS SOLOSTAR) 100 UNIT/ML SOPN Inject 20 to 40 units into the skin HS. Increase dose by 2 units for readings above 140. PATIENT NEEDS OFFICE VISIT FOR ADDITIONAL REFILLS 15 mL 0  . losartan-hydrochlorothiazide (HYZAAR) 100-25 MG per tablet TAKE 1 TABLET BY MOUTH DAILY. NEEDS OFFICE VISIT 30 tablet 0  . meloxicam (MOBIC) 15 MG tablet Take 1 tablet (15 mg total) by mouth daily. 30 tablet 0  . metFORMIN (GLUCOPHAGE) 1000 MG tablet Take 1 tablet (1,000 mg total) by mouth 2 (two) times daily with a meal. PATIENT NEEDS OFFICE VISIT FOR ADDITIONAL REFILLS 60 tablet 0  . Multiple Vitamins-Minerals (MULTIVITAMIN WITH MINERALS) tablet Take 1 tablet by mouth daily.    . Omega 3 1000 MG CAPS Take 1,000 mg by mouth daily.    . pantoprazole (PROTONIX) 40 MG tablet TAKE 1 TABLET (40 MG TOTAL) BY MOUTH DAILY. 30 tablet 1  . Insulin Pen Needle (B-D ULTRAFINE III SHORT PEN) 31G X 8 MM MISC Use to inject insulin every night. PATIENT NEEDS OFFICE VISIT FOR ADDITIONAL REFILLS 30 each 0    Allergies as of 07/15/2015 - Review Complete 07/15/2015  Allergen Reaction Noted  . Norvasc [amlodipine besylate] Swelling 02/22/2012      Review of Systems:    Positive for mild SOB. All systems reviewed and otherwise negative.    Physical Exam:  Vital signs in last 24 hours: Temp:  [98.4 F (36.9 C)] 98.4 F (36.9 C) (  08/29 1221) Pulse Rate:  [113-120] 114 (08/29 1517) Resp:  [18-20] 18 (08/29 1517) BP: (108-125)/(64-87) 112/64 mmHg (08/29 1517) SpO2:  [95 %-97 %] 95 % (08/29 1517)   General:   Pleasant obese black female in NAD Head:  Normocephalic and atraumatic. Eyes:   No icterus.   Conjunctiva pink. Ears:  Normal auditory acuity. Neck:  Supple Lungs:  Respirations even and unlabored. Lungs clear to auscultation bilaterally.   No wheezes, crackles, or rhonchi.  Heart:  Tachycardic at 114 Abdomen:  Soft, nondistended, MIld-moderate RLQ tenderness.  Normal bowel sounds. No appreciable masses or hepatomegaly.  Msk:  Symmetrical without gross deformities.  Extremities:  Without edema. Neurologic:  Alert and  oriented x4;  grossly normal neurologically. Skin:  Intact without significant lesions or rashes. Psych:  Alert and cooperative. Normal affect.  LAB RESULTS:  Recent Labs  07/15/15 1247  WBC 16.5*  HGB 12.7  HCT 40.1  PLT 342   BMET  Recent Labs  07/15/15 1247  NA 136  K 3.8  CL 103  CO2 21*  GLUCOSE 217*  BUN 25*  CREATININE 1.05*  CALCIUM 9.1   LFT  Recent Labs  07/15/15 1247  PROT 7.6  ALBUMIN 3.8  AST 31  ALT 34  ALKPHOS 50  BILITOT 0.4    PREVIOUS ENDOSCOPIES:            Complete colonoscopy with excellent prep Feb 2014 - no polyps   Impression / Plan:   1. Pleasant 54 year female with abdominal cramps / rectal bleeding. Her WBC is elevated. Given pain, ischemic colitis is possibly.  Takes NSAIDs so brisk bleed not excluded but would expect BUN to be elevated and patient more hemodynamically unstable.    Will obtain CTscan  start antibiotics (leukocytosis).   Ice chips only  Monitor hgb, I expect it to fall  2. Microcytosis, MCV 68. Will check iron  studies .  Thanks   LOS: 0 days   Tye Savoy  07/15/2015, 3:30 PM    ________________________________________________________________________  Velora Heckler GI MD note:  I personally examined the patient, reviewed the data and agree with the assessment and plan described above.  Seems that this started as non-bloody diarrhea then progressed to overt bleeding.  I'm most suspicious for ischemic colitis.  Planning for CT scan, IV Abx, NPO for now.     Owens Loffler, MD [facility] Pager [phone]

## 2015-07-15 NOTE — ED Notes (Signed)
Pt is coming from home via EMS. Pt had recent ankle sprain, started on Meloxicam, pt states that she develops GI bleed from NSAIDS. Pt having blood in her stools today (reports as dark red), last event  about 30 minutes ago, associated with abdominal cramping. No clotting, only loose. No n/v or dizziness, vss en route.  Pt has had hx of the same previously, "never this bad"

## 2015-07-16 ENCOUNTER — Encounter (HOSPITAL_COMMUNITY)
Admission: EM | Disposition: A | Discharge status: Home / Self Care | Payer: Self-pay | Source: Home / Self Care | Attending: Internal Medicine

## 2015-07-16 ENCOUNTER — Inpatient Hospital Stay (HOSPITAL_COMMUNITY): Payer: Managed Care, Other (non HMO) | Admitting: Certified Registered Nurse Anesthetist

## 2015-07-16 ENCOUNTER — Encounter (HOSPITAL_COMMUNITY): Payer: Self-pay

## 2015-07-16 DIAGNOSIS — R109 Unspecified abdominal pain: Secondary | ICD-10-CM | POA: Insufficient documentation

## 2015-07-16 DIAGNOSIS — K921 Melena: Secondary | ICD-10-CM

## 2015-07-16 DIAGNOSIS — R1013 Epigastric pain: Secondary | ICD-10-CM

## 2015-07-16 HISTORY — PX: ESOPHAGOGASTRODUODENOSCOPY (EGD) WITH PROPOFOL: SHX5813

## 2015-07-16 LAB — BASIC METABOLIC PANEL
ANION GAP: 9 (ref 5–15)
BUN: 24 mg/dL — AB (ref 6–20)
CALCIUM: 8.6 mg/dL — AB (ref 8.9–10.3)
CO2: 27 mmol/L (ref 22–32)
Chloride: 101 mmol/L (ref 101–111)
Creatinine, Ser: 0.99 mg/dL (ref 0.44–1.00)
GFR calc Af Amer: >60 mL/min (ref >60)
GLUCOSE: 134 mg/dL — AB (ref 65–99)
Potassium: 3.5 mmol/L (ref 3.5–5.1)
SODIUM: 137 mmol/L (ref 135–145)

## 2015-07-16 LAB — CBC
HCT: 32.5 % — ABNORMAL LOW (ref 36.0–46.0)
HEMOGLOBIN: 10.4 g/dL — AB (ref 12.0–15.0)
MCH: 21.8 pg — ABNORMAL LOW (ref 26.0–34.0)
MCHC: 32 g/dL (ref 30.0–36.0)
MCV: 68.3 fL — ABNORMAL LOW (ref 78.0–100.0)
Platelets: 280 10*3/uL (ref 150–400)
RBC: 4.76 MIL/uL (ref 3.87–5.11)
RDW: 16.1 % — AB (ref 11.5–15.5)
WBC: 10.9 10*3/uL — ABNORMAL HIGH (ref 4.0–10.5)

## 2015-07-16 LAB — GLUCOSE, CAPILLARY
GLUCOSE-CAPILLARY: 134 mg/dL — AB (ref 65–99)
GLUCOSE-CAPILLARY: 139 mg/dL — AB (ref 65–99)
GLUCOSE-CAPILLARY: 210 mg/dL — AB (ref 65–99)
Glucose-Capillary: 129 mg/dL — ABNORMAL HIGH (ref 65–99)
Glucose-Capillary: 132 mg/dL — ABNORMAL HIGH (ref 65–99)

## 2015-07-16 SURGERY — ESOPHAGOGASTRODUODENOSCOPY (EGD) WITH PROPOFOL
Anesthesia: Monitor Anesthesia Care

## 2015-07-16 MED ORDER — PROPOFOL 10 MG/ML IV BOLUS
INTRAVENOUS | Status: AC
  Filled 2015-07-16: qty 20

## 2015-07-16 MED ORDER — PROPOFOL 10 MG/ML IV BOLUS
INTRAVENOUS | Status: DC | PRN
  Administered 2015-07-16 (×3): 20 mg via INTRAVENOUS

## 2015-07-16 MED ORDER — LOSARTAN POTASSIUM 50 MG PO TABS
100.0000 mg | ORAL_TABLET | Freq: Every day | ORAL | Status: DC
  Administered 2015-07-16 – 2015-07-17 (×2): 100 mg via ORAL
  Filled 2015-07-16 (×2): qty 2

## 2015-07-16 MED ORDER — PANTOPRAZOLE SODIUM 40 MG PO TBEC
40.0000 mg | DELAYED_RELEASE_TABLET | Freq: Every day | ORAL | Status: DC
  Administered 2015-07-17: 40 mg via ORAL
  Filled 2015-07-16: qty 1

## 2015-07-16 MED ORDER — PROPOFOL INFUSION 10 MG/ML OPTIME
INTRAVENOUS | Status: DC | PRN
  Administered 2015-07-16: 120 ug/kg/min via INTRAVENOUS

## 2015-07-16 SURGICAL SUPPLY — 15 items

## 2015-07-16 NOTE — Care Management Note (Signed)
Case Management Note  Patient Details  Name: LELAH RENNAKER MRN: [id number] Date of Birth: 05-31-1961  Subjective/Objective:              Gi bleeding with signs and symptoms of gi infectious process      Action/Plan: Date:  July 16, 2015 U.R. performed for needs and level of care. Will continue to follow for Case Management needs.  Velva Harman, RN, BSN, [place]   [phone]  Expected Discharge Date:   (unknown)               Expected Discharge Plan:  Home/Self Care  In-House Referral:  NA  Discharge planning Services  CM Consult  Post Acute Care Choice:  NA Choice offered to:  NA  DME Arranged:    DME Agency:     HH Arranged:    [place] Agency:     Status of Service:  Completed, signed off  Medicare Important Message Given:    Date Medicare IM Given:    Medicare IM give by:    Date Additional Medicare IM Given:    Additional Medicare Important Message give by:     If discussed at [place] of Stay Meetings, dates discussed:    Additional Comments:  Leeroy Cha, RN 07/16/2015, 9:11 AM

## 2015-07-16 NOTE — Op Note (Signed)
[facility] [place], [id number]   ENDOSCOPY PROCEDURE REPORT  PATIENT: Carol, Mueller  MR#: [id number] BIRTHDATE: 1961-04-14 , 36  yrs. old GENDER: female ENDOSCOPIST: [name], MD PROCEDURE DATE:  07/16/2015 PROCEDURE:  EGD, diagnostic ASA CLASS:     Class III INDICATIONS:  GI bleeding, anemia. MEDICATIONS: Monitored anesthesia care TOPICAL ANESTHETIC: none  DESCRIPTION OF PROCEDURE: After the risks benefits and alternatives of the procedure were thoroughly explained, informed consent was obtained.  The Pentax Gastroscope [id number] endoscope was introduced through the mouth and advanced to the second portion of the duodenum , Without limitations.  The instrument was slowly withdrawn as the mucosa was fully examined.  EXAM: The esophagus and gastroesophageal junction were completely normal in appearance.  The stomach was entered and closely examined.The antrum, angularis, and lesser curvature were well visualized, including a retroflexed view of the cardia and fundus. The stomach wall was normally distensable.  The scope passed easily through the pylorus into the duodenum.  Retroflexed views revealed no abnormalities.     The scope was then withdrawn from the patient and the procedure completed.  COMPLICATIONS: There were no immediate complications.  ENDOSCOPIC IMPRESSION: Normal appearing esophagus and GE junction, the stomach was well visualized and normal in appearance, normal appearing duodenum. Given overall presentation, testing it seems most likely that her bleeding and abd pain yesterday were from mild resolved ischemia or infection.  RECOMMENDATIONS: Follow clinically.  Will advance diet today and observe another 12-24 hours.  If no further pain, bleeding and she eats well then she is safe for d/c and I will plan on seeing her in the office in 7-8 weeks in follow up.  Would continue antibiotics for total 7 day course.  She  had colonoscopy 3 years ago (Dr.  Sharlett Iles), unless her GI symptoms return I would not plan on repeating that examination.  eSigned:  [name], MD 07/16/2015 12:12 PM    CC:

## 2015-07-16 NOTE — Anesthesia Postprocedure Evaluation (Signed)
  Anesthesia Post-op Note  Patient: Carol Mueller  Procedure(s) Performed: Procedure(s): ESOPHAGOGASTRODUODENOSCOPY (EGD) WITH PROPOFOL (N/A)  Patient Location: PACU  Anesthesia Type:MAC  Level of Consciousness: awake  Airway and Oxygen Therapy: Patient Spontanous Breathing  Post-op Pain: none  Post-op Assessment: Post-op Vital signs reviewed, Patient's Cardiovascular Status Stable, Respiratory Function Stable, Patent Airway, No signs of Nausea or vomiting and Pain level controlled              Post-op Vital Signs: Reviewed and stable  Last Vitals:  Filed Vitals:   07/16/15 1232  BP: 121/73  Pulse:   Temp:   Resp: 16    Complications: No apparent anesthesia complications

## 2015-07-16 NOTE — Interval H&P Note (Signed)
History and Physical Interval Note:  07/16/2015 11:44 AM  Carol Mueller  has presented today for surgery, with the diagnosis of Gastrointestinal bleeding  The various methods of treatment have been discussed with the patient and family. After consideration of risks, benefits and other options for treatment, the patient has consented to  Procedure(s): ESOPHAGOGASTRODUODENOSCOPY (EGD) WITH PROPOFOL (N/A) as a surgical intervention .  The patient's history has been reviewed, patient examined, no change in status, stable for surgery.  I have reviewed the patient's chart and labs.  Questions were answered to the patient's satisfaction.     [name]

## 2015-07-16 NOTE — Anesthesia Preprocedure Evaluation (Signed)
Anesthesia Evaluation  Patient identified by MRN, date of birth, ID band Patient awake    Reviewed: Allergy & Precautions, NPO status , Patient's Chart, lab work & pertinent test results, reviewed documented beta blocker date and time   History of Anesthesia Complications Negative for: history of anesthetic complications  Airway Mallampati: III  TM Distance: >3 FB Neck ROM: Full    Dental  (+) Teeth Intact   Pulmonary neg pulmonary ROS,  breath sounds clear to auscultation        Cardiovascular hypertension, Pt. on medications and Pt. on home beta blockers Rhythm:Regular     Neuro/Psych negative neurological ROS  negative psych ROS   GI/Hepatic negative GI ROS, Neg liver ROS,   Endo/Other  diabetes, Type 2, Insulin Dependent, Oral Hypoglycemic AgentsMorbid obesity  Renal/GU negative Renal ROS     Musculoskeletal   Abdominal   Peds  Hematology  (+) anemia ,   Anesthesia Other Findings   Reproductive/Obstetrics                             Anesthesia Physical Anesthesia Plan  ASA: III  Anesthesia Plan: MAC   Post-op Pain Management:    Induction: Intravenous  Airway Management Planned: Nasal Cannula  Additional Equipment:   Intra-op Plan:   Post-operative Plan:   Informed Consent: I have reviewed the patients History and Physical, chart, labs and discussed the procedure including the risks, benefits and alternatives for the proposed anesthesia with the patient or authorized representative who has indicated his/her understanding and acceptance.   Dental advisory given  Plan Discussed with: CRNA and Surgeon  Anesthesia Plan Comments:         Anesthesia Quick Evaluation

## 2015-07-16 NOTE — Transfer of Care (Signed)
Immediate Anesthesia Transfer of Care Note  Patient: Carol Mueller  Procedure(s) Performed: Procedure(s): ESOPHAGOGASTRODUODENOSCOPY (EGD) WITH PROPOFOL (N/A)  Patient Location: PACU  Anesthesia Type:MAC  Level of Consciousness: awake, alert  and oriented  Airway & Oxygen Therapy: Patient Spontanous Breathing and Patient connected to nasal cannula oxygen  Post-op Assessment: Report given to RN and Post -op Vital signs reviewed and stable  Post vital signs: Reviewed and stable  Last Vitals:  Filed Vitals:   07/16/15 1044  BP: 145/84  Pulse: 92  Temp: 36.9 C  Resp: 16    Complications: No apparent anesthesia complications

## 2015-07-16 NOTE — Progress Notes (Signed)
     No bleeding since yesterday evening. Hgb fell from 12.7 to 10.4. Surprisingly CT scan negative. Needs EGD today to rule out upper GI bleeding in setting of NSAIDS. Patient NPO except for sip of water with meds.     Vital signs in last 24 hours: Temp:  [98.1 F (36.7 C)-99.4 F (37.4 C)] 98.1 F (36.7 C) (08/30 0800) Pulse Rate:  [86-120] 91 (08/30 0800) Resp:  [14-21] 15 (08/30 0800) BP: (94-154)/(58-87) 154/74 mmHg (08/30 0800) SpO2:  [94 %-99 %] 97 % (08/30 0800) Weight:  [250 lb (113.4 kg)] 250 lb (113.4 kg) (08/29 1600) Last BM Date: 07/15/15  BMET  Recent Labs  07/15/15 1247 07/16/15 0325  NA 136 137  K 3.8 3.5  CL 103 101  CO2 21* 27  GLUCOSE 217* 134*  BUN 25* 24*  CREATININE 1.05* 0.99  CALCIUM 9.1 8.6*     LOS: 1 day   Tye Savoy  07/16/2015, 9:55 AM   ________________________________________________________________________  Velora Heckler GI MD note:  I personally examined the patient, reviewed the data and agree with the assessment and plan described above.  The CT scan report details no colonic inflammation that I would suspect from ischemic colitis or infectious colitis.  She's been taking NSAIDs pretty frequently lately (motrin and then Mobic). Will plan on EGD to check for UGi source.   Owens Loffler, MD [facility] Pager [phone]

## 2015-07-16 NOTE — Progress Notes (Signed)
[facility]                                                                              Patient Demographics  Carol Mueller, is a 54 y.o. female, DOB - July 14, 1961, LSL:[id number]  Admit date - 07/15/2015   Admitting Physician Ripudeep Krystal Eaton, MD  Outpatient Primary MD for the patient is Delman Cheadle, MD  LOS - 1   Chief Complaint  Patient presents with  . Blood In Stools       Brief HPI   Patient is a 54 year old female with hypertension, chronic anemia, insulin-dependent diabetes mellitus, hyperlipidemia presented to ED with multiple episodes of GI bleeding. History was obtained from the patient who reported that she had noticed abdominal cramping with gas pains in the last 2-3 days. This morning, patient went to work around 8 AM and had a large bloody bowel movement after breakfast. She had worsening abdominal cramping but no nausea, vomiting or any fevers or chills. Subsequently patient came home and had another 2 bloody bowel movements, described as dark red blood. She felt dizzy and short of breath on walking, hence came to the ER via EMS for evaluation. In the ED, patient had subsequently 2 more bloody bowel movements. Patient reported that she had a recent ankle sprain and plantar fasciitis. She was started on meloxicam by the urgent care. Patient reports that she has had prior GI bleeding whenever she took NSAIDs but 'never this bad'. ED workup showed, CBC  WBC 16.5 hemoglobin 12.7, hematocrit 40.1, no prior baseline H&H, FOBT positive. BMET unremarkable tried hospitalist service requested for admission for GI bleeding..    Assessment & Plan    Principal Problem:  GI bleed: Likely precipitated due to recent NSAIDs, known history of GERD. Patient has prior NSAID related to GI bleeds in the past. Prior colonoscopy in 2014 had shown moderate diverticulosis in the descending and sigmoid colon by Dr. Sharlett Iles. - CT abdomen negative for any ischemic or infectious  colitis - Placed on IV fluids, IV Protonix drip, NPO for endoscopy today - Appreciate GI recommendations, currently on IV Cipro and Flagyl  Active Problems:  Hypertension - Currently BP stable, will continue Coreg and added losartan   Type II diabetes mellitus, uncontrolled - Given patient NPO, place on sliding scale insulin only - hemoglobin A1c pending   Tachycardia - Likely due to dehydration, active GI bleeding, placed on IV fluids and continue Coreg  Right ankle pain: Currently resolved  DVT prophylaxis: SCDs   CODE STATUS: Full code   Code Status: Full code  Family Communication: Discussed in detail with the patient, all imaging results, lab results explained to the patient    Disposition Plan: May be able to transfer to telemetry later today after endoscopy  Time Spent in minutes   25 minutes  Procedures  CT abdomen and pelvis  Consults   Gastroenterology  DVT Prophylaxis SCD's  Medications  Scheduled Meds: . carvedilol  3.125 mg Oral BID WC  . ciprofloxacin  400 mg Intravenous Q12H  . Influenza vac split quadrivalent PF  0.5 mL Intramuscular Tomorrow-1000  . insulin aspart  0-15 Units Subcutaneous  6 times per day  . metronidazole  500 mg Intravenous Q8H  . [START ON 07/19/2015] pantoprazole (PROTONIX) IV  40 mg Intravenous Q12H   Continuous Infusions: . sodium chloride 75 mL/hr at 07/15/15 1541  . pantoprozole (PROTONIX) infusion 8 mg/hr (07/16/15 0035)   PRN Meds:.acetaminophen **OR** acetaminophen, HYDROcodone-acetaminophen, HYDROmorphone (DILAUDID) injection, ondansetron **OR** ondansetron (ZOFRAN) IV   Antibiotics   Anti-infectives    Start     Dose/Rate Route Frequency Ordered Stop   07/15/15 1600  metroNIDAZOLE (FLAGYL) IVPB 500 mg     500 mg 100 mL/hr over 60 Minutes Intravenous Every 8 hours 07/15/15 1546     07/15/15 1545  ciprofloxacin (CIPRO) IVPB 400 mg     400 mg 200 mL/hr over 60 Minutes Intravenous Every 12 hours 07/15/15 1546           Subjective:   Deltha Bernales was seen and examined today. Patient reports still having some epigastric abdominal pain, had one episode of rectal bleeding yesterday evening, none last night or in the morning. No nausea or vomiting or hematemesis. Patient denies dizziness, chest pain, shortness of breath,  new weakness, numbess, tingling. No acute events overnight.    Objective:   Blood pressure 145/84, pulse 92, temperature 98.5 F (36.9 C), temperature source Oral, resp. rate 16, height 5\' 4"  (1.626 m), weight 113.399 kg (250 lb), SpO2 96 %.  Wt Readings from Last 3 Encounters:  07/16/15 113.399 kg (250 lb)  06/30/15 108.591 kg (239 lb 6.4 oz)  08/25/14 116.302 kg (256 lb 6.4 oz)     Intake/Output Summary (Last 24 hours) at 07/16/15 1056 Last data filed at 07/16/15 0900  Gross per 24 hour  Intake 2257.5 ml  Output    750 ml  Net 1507.5 ml    Exam  General: Alert and oriented x 3, NAD  HEENT:  PERRLA, EOMI, Anicteric Sclera, mucous membranes moist.   Neck: Supple, no JVD, no masses  CVS: S1 S2 auscultated, no rubs, murmurs or gallops. Regular rate and rhythm.  Respiratory: Clear to auscultation bilaterally, no wheezing, rales or rhonchi  Abdomen: Soft, mild epigastric tenderness to deep palpation, nondistended, + bowel sounds  Ext: no cyanosis clubbing or edema  Neuro: AAOx3, Cr N's II- XII. Strength 5/5 upper and lower extremities bilaterally  Skin: No rashes  Psych: Normal affect and demeanor, alert and oriented x3    Data Review   Micro Results Recent Results (from the past 240 hour(s))  MRSA PCR Screening     Status: None   Collection Time: 07/15/15  3:35 PM  Result Value Ref Range Status   MRSA by PCR NEGATIVE NEGATIVE Final    Comment:        The GeneXpert MRSA Assay (FDA approved for NASAL specimens only), is one component of a comprehensive MRSA colonization surveillance program. It is not intended to diagnose MRSA infection nor to  guide or monitor treatment for MRSA infections.     Radiology Reports Dg Ankle Complete Right  06/30/2015   CLINICAL DATA:  Right foot and ankle pain, history of plantar fasciitis  EXAM: RIGHT ANKLE - COMPLETE 3+ VIEW  COMPARISON:  None.  FINDINGS: No acute fracture or dislocation is noted. No soft tissue changes are seen. A small plantar calcaneal spur is noted.  IMPRESSION: No acute abnormality seen.   Electronically Signed   By: Inez Catalina M.D.   On: 06/30/2015 14:41   Ct Abdomen Pelvis W Contrast  07/15/2015   CLINICAL DATA:  Acute onset of GI bleeding. Abdominal cramping and dizziness. Shortness of breath. Initial encounter.  EXAM: CT ABDOMEN AND PELVIS WITH CONTRAST  TECHNIQUE: Multidetector CT imaging of the abdomen and pelvis was performed using the standard protocol following bolus administration of intravenous contrast.  CONTRAST:  164mL OMNIPAQUE IOHEXOL 300 MG/ML  SOLN  COMPARISON:  Pelvic ultrasound performed 08/30/2013  FINDINGS: The visualized lung bases are clear.  The liver and spleen are unremarkable in appearance. The gallbladder is within normal limits. The pancreas and left adrenal gland are unremarkable. Scattered postoperative change is noted about the expected location of the right adrenal gland.  A few scattered small bilateral renal cysts are seen, measuring up to 1.7 cm in size. The kidneys are otherwise unremarkable. There is no evidence of hydronephrosis. No renal or ureteral stones are seen. No perinephric stranding is appreciated.  No free fluid is identified. The small bowel is unremarkable in appearance. The stomach is within normal limits. No acute vascular abnormalities are seen.  The appendix is normal in caliber and contains trace contrast, without evidence of appendicitis. Scattered diverticulosis is noted along the entirety of the colon. Contrast progresses to the level of the sigmoid colon. There is no evidence of acute diverticulitis.  The bladder is mildly  distended and grossly unremarkable. Multiple prominent calcified uterine fibroids are seen. No suspicious adnexal masses are seen. No inguinal lymphadenopathy is seen.  No acute osseous abnormalities are identified. Facet disease is noted at the lower lumbar spine.  IMPRESSION: 1. No acute abnormality seen in the abdomen or pelvis. 2. Few scattered small bilateral renal cysts seen. 3. Scattered diverticulosis along the entirety of the colon, without evidence of diverticulitis. 4. Prominent calcified uterine fibroids seen.   Electronically Signed   By: Garald Balding M.D.   On: 07/15/2015 19:30   Dg Foot 2 Views Right  06/30/2015   CLINICAL DATA:  Right foot pain, no known injury, initial encounter  EXAM: RIGHT FOOT - 2 VIEW  COMPARISON:  None.  FINDINGS: No acute fracture or dislocation is noted. Some mild cortical thickening is noted in the second through fourth metatarsals which may be related to stress fractures and healing. A small calcaneal spur is noted. No soft tissue changes are noted.  IMPRESSION: Possible healed stress fractures as described. No acute abnormality noted.   Electronically Signed   By: Inez Catalina M.D.   On: 06/30/2015 14:43    CBC  Recent Labs Lab 07/15/15 1247 07/16/15 0325  WBC 16.5* 10.9*  HGB 12.7 10.4*  HCT 40.1 32.5*  PLT 342 280  MCV 68.5* 68.3*  MCH 21.7* 21.8*  MCHC 31.7 32.0  RDW 16.3* 16.1*    Chemistries   Recent Labs Lab 07/15/15 1247 07/16/15 0325  NA 136 137  K 3.8 3.5  CL 103 101  CO2 21* 27  GLUCOSE 217* 134*  BUN 25* 24*  CREATININE 1.05* 0.99  CALCIUM 9.1 8.6*  AST 31  --   ALT 34  --   ALKPHOS 50  --   BILITOT 0.4  --    ------------------------------------------------------------------------------------------------------------------ estimated creatinine clearance is 79.3 mL/min (by C-G formula based on Cr of  0.99). ------------------------------------------------------------------------------------------------------------------ No results for input(s): HGBA1C in the last 72 hours. ------------------------------------------------------------------------------------------------------------------ No results for input(s): CHOL, HDL, LDLCALC, TRIG, CHOLHDL, LDLDIRECT in the last 72 hours. ------------------------------------------------------------------------------------------------------------------ No results for input(s): TSH, T4TOTAL, T3FREE, THYROIDAB in the last 72 hours.  Invalid input(s): FREET3 ------------------------------------------------------------------------------------------------------------------ No results for input(s): VITAMINB12, FOLATE, FERRITIN, TIBC, IRON,  RETICCTPCT in the last 72 hours.  Coagulation profile No results for input(s): INR, PROTIME in the last 168 hours.  No results for input(s): DDIMER in the last 72 hours.  Cardiac Enzymes No results for input(s): CKMB, TROPONINI, MYOGLOBIN in the last 168 hours.  Invalid input(s): CK ------------------------------------------------------------------------------------------------------------------ Invalid input(s): Bier  07/15/15 1626 07/15/15 2003 07/16/15 0034 07/16/15 0408 07/16/15 0816  GLUCAP 145* 124* 139* 134* 129*     RAI,RIPUDEEP M.D. [facility] 07/16/2015, 10:56 AM  Pager: [phone] Between 7am to 7pm - call Pager - [phone]  After 7pm go to [email or web address] - password [email or web address]  Call night coverage person covering after 7pm

## 2015-07-16 NOTE — H&P (View-Only) (Signed)
     No bleeding since yesterday evening. Hgb fell from 12.7 to 10.4. Surprisingly CT scan negative. Needs EGD today to rule out upper GI bleeding in setting of NSAIDS. Patient NPO except for sip of water with meds.     Vital signs in last 24 hours: Temp:  [98.1 F (36.7 C)-99.4 F (37.4 C)] 98.1 F (36.7 C) (08/30 0800) Pulse Rate:  [86-120] 91 (08/30 0800) Resp:  [14-21] 15 (08/30 0800) BP: (94-154)/(58-87) 154/74 mmHg (08/30 0800) SpO2:  [94 %-99 %] 97 % (08/30 0800) Weight:  [250 lb (113.4 kg)] 250 lb (113.4 kg) (08/29 1600) Last BM Date: 07/15/15  BMET  Recent Labs  07/15/15 1247 07/16/15 0325  NA 136 137  K 3.8 3.5  CL 103 101  CO2 21* 27  GLUCOSE 217* 134*  BUN 25* 24*  CREATININE 1.05* 0.99  CALCIUM 9.1 8.6*     LOS: 1 day   Tye Savoy  07/16/2015, 9:55 AM   ________________________________________________________________________  Velora Heckler GI MD note:  I personally examined the patient, reviewed the data and agree with the assessment and plan described above.  The CT scan report details no colonic inflammation that I would suspect from ischemic colitis or infectious colitis.  She's been taking NSAIDs pretty frequently lately (motrin and then Mobic). Will plan on EGD to check for UGi source.   Owens Loffler, MD [facility] Pager [phone]

## 2015-07-17 DIAGNOSIS — R109 Unspecified abdominal pain: Secondary | ICD-10-CM

## 2015-07-17 LAB — IRON AND TIBC
Iron: 54 ug/dL (ref 28–170)
Saturation Ratios: 18 % (ref 10.4–31.8)
TIBC: 304 ug/dL (ref 250–450)
UIBC: 250 ug/dL

## 2015-07-17 LAB — FERRITIN: Ferritin: 68 ng/mL (ref 11–307)

## 2015-07-17 LAB — CBC
HCT: 30.2 % — ABNORMAL LOW (ref 36.0–46.0)
Hemoglobin: 9.6 g/dL — ABNORMAL LOW (ref 12.0–15.0)
MCH: 21.9 pg — AB (ref 26.0–34.0)
MCHC: 31.8 g/dL (ref 30.0–36.0)
MCV: 68.9 fL — AB (ref 78.0–100.0)
PLATELETS: 263 10*3/uL (ref 150–400)
RBC: 4.38 MIL/uL (ref 3.87–5.11)
RDW: 16.1 % — AB (ref 11.5–15.5)
WBC: 10.4 10*3/uL (ref 4.0–10.5)

## 2015-07-17 LAB — GLUCOSE, CAPILLARY
GLUCOSE-CAPILLARY: 129 mg/dL — AB (ref 65–99)
Glucose-Capillary: 133 mg/dL — ABNORMAL HIGH (ref 65–99)
Glucose-Capillary: 150 mg/dL — ABNORMAL HIGH (ref 65–99)
Glucose-Capillary: 139 mg/dL — ABNORMAL HIGH (ref 65–99)

## 2015-07-17 LAB — HEMOGLOBIN A1C
Hgb A1c MFr Bld: 7.9 % — ABNORMAL HIGH (ref 4.8–5.6)
MEAN PLASMA GLUCOSE: 180 mg/dL

## 2015-07-17 MED ORDER — CIPROFLOXACIN HCL 500 MG PO TABS: 500.0000 mg | ORAL_TABLET | Freq: Two times a day (BID) | ORAL | Status: DC

## 2015-07-17 MED ORDER — SIMETHICONE 80 MG PO CHEW
80.0000 mg | CHEWABLE_TABLET | Freq: Four times a day (QID) | ORAL | Status: DC | PRN
  Administered 2015-07-17: 80 mg via ORAL
  Filled 2015-07-17 (×2): qty 1

## 2015-07-17 MED ORDER — METRONIDAZOLE 500 MG PO TABS: 500.0000 mg | ORAL_TABLET | Freq: Three times a day (TID) | ORAL | Status: DC

## 2015-07-17 MED ORDER — HYDROCODONE-ACETAMINOPHEN 5-325 MG PO TABS: 1.0000 | ORAL_TABLET | Freq: Four times a day (QID) | ORAL | Status: DC | PRN

## 2015-07-17 MED ORDER — PROMETHAZINE HCL 12.5 MG PO TABS: 12.5000 mg | ORAL_TABLET | Freq: Four times a day (QID) | ORAL | Status: DC | PRN

## 2015-07-17 MED ORDER — SIMETHICONE 80 MG PO CHEW: 80.0000 mg | CHEWABLE_TABLET | Freq: Four times a day (QID) | ORAL | Status: DC | PRN

## 2015-07-17 MED ORDER — METRONIDAZOLE 500 MG PO TABS
500.0000 mg | ORAL_TABLET | Freq: Three times a day (TID) | ORAL | Status: DC
  Administered 2015-07-17 (×2): 500 mg via ORAL
  Filled 2015-07-17 (×2): qty 1

## 2015-07-17 NOTE — Progress Notes (Signed)
    Progress Note   Subjective  no complaints   Objective   Vital signs in last 24 hours: Temp:  [97.9 F (36.6 C)-98.7 F (37.1 C)] 98.7 F (37.1 C) (08/31 0800) Pulse Rate:  [63-96] 80 (08/31 0417) Resp:  [12-23] 21 (08/31 0902) BP: (108-161)/(50-117) 122/69 mmHg (08/31 0902) SpO2:  [92 %-98 %] 97 % (08/31 0800) Last BM Date: 07/17/15 General:    Pleasant black female in NAD Heart:  Regular rate and rhythm Abdomen:  Soft, nontender and nondistended. Normal bowel sounds. Neurologic:  Alert and oriented,  grossly normal neurologically. Psych:  Cooperative. Normal mood and affect.  Lab Results:  Recent Labs  07/15/15 1247 07/16/15 0325 07/17/15 0355  WBC 16.5* 10.9* 10.4  HGB 12.7 10.4* 9.6*  HCT 40.1 32.5* 30.2*  PLT 342 280 263   BMET  Recent Labs  07/15/15 1247 07/16/15 0325  NA 136 137  K 3.8 3.5  CL 103 101  CO2 21* 27  GLUCOSE 217* 134*  BUN 25* 24*  CREATININE 1.05* 0.99  CALCIUM 9.1 8.6*   LFT  Recent Labs  07/15/15 1247  PROT 7.6  ALBUMIN 3.8  AST 31  ALT 34  ALKPHOS 50  BILITOT 0.4     Assessment / Plan:    49. 54 year old female with abdominal cramping / hematochezia. CTscan negative but still suspect ischemic colitis. She was taking NSAIDs but EGD was negative. No further bleeding. Stable for discharge from GI standpoint. Will make her a follow up appointment with [place].   2. Anemia of acute blood loss. Would discharge home on oral iron. We will check CBC at time of follow up.     LOS: 2 days   Tye Savoy  07/17/2015, 11:34 AM     Attending physician's note   I have taken an interval history, reviewed the chart and examined the patient. I agree with the Advanced Practitioner's note, impression and recommendations. Suspected ischemic colitis-resolving. Pt continues to improve. For discharge today. Follow up in office with Dr. [name].   Pricilla Riffle. Fuller Plan, MD [facility] [phone] pager Mon-Fri 8a-5p [phone] weekends, holidays and  5p-8a or per [facility]

## 2015-07-17 NOTE — Discharge Summary (Signed)
Physician Discharge Summary   Patient ID: Carol Mueller MRN: [id number] DOB/AGE: 1961/06/21 54 y.o.  Admit date: 07/15/2015 Discharge date: 07/17/2015  Primary Care Physician:  Delman Cheadle, MD  Discharge Diagnoses:    Rectal bleeding likely ischemic colitis  . Hypertension . Type II diabetes mellitus, uncontrolled . GI bleed . Tachycardia . Ankle pain  Consults: Gastroenterology, Dr. [name]   Recommendations for Outpatient Follow-up:  Patient was recommended to avoid NSAIDs   TESTS THAT NEED FOLLOW-UP CBC, BMET at the follow-up appointment   DIET: Heart healthy diet    Allergies:   Allergies  Allergen Reactions  . Norvasc [Amlodipine Besylate] Swelling    ANGIOEDEMA     Discharge Medications:   Medication List    STOP taking these medications        meloxicam 15 MG tablet  Commonly known as:  MOBIC      TAKE these medications        carvedilol 3.125 MG tablet  Commonly known as:  COREG  Take 3.125 mg by mouth 2 (two) times daily with a meal.     ciprofloxacin 500 MG tablet  Commonly known as:  CIPRO  Take 1 tablet (500 mg total) by mouth 2 (two) times daily. X 7days     HYDROcodone-acetaminophen 5-325 MG per tablet  Commonly known as:  NORCO/VICODIN  Take 1 tablet by mouth every 6 (six) hours as needed for severe pain.     Insulin Glargine 100 UNIT/ML Solostar Pen  Commonly known as:  LANTUS SOLOSTAR  Inject 20 to 40 units into the skin HS. Increase dose by 2 units for readings above 140. PATIENT NEEDS OFFICE VISIT FOR ADDITIONAL REFILLS     Insulin Pen Needle 31G X 8 MM Misc  Commonly known as:  B-D ULTRAFINE III SHORT PEN  Use to inject insulin every night. PATIENT NEEDS OFFICE VISIT FOR ADDITIONAL REFILLS     INVOKANA 100 MG Tabs tablet  Generic drug:  canagliflozin  Take 100 mg by mouth daily.     losartan-hydrochlorothiazide 100-25 MG per tablet  Commonly known as:  HYZAAR  TAKE 1 TABLET BY MOUTH DAILY. NEEDS OFFICE VISIT      metFORMIN 1000 MG tablet  Commonly known as:  GLUCOPHAGE  Take 1 tablet (1,000 mg total) by mouth 2 (two) times daily with a meal. PATIENT NEEDS OFFICE VISIT FOR ADDITIONAL REFILLS     metroNIDAZOLE 500 MG tablet  Commonly known as:  FLAGYL  Take 1 tablet (500 mg total) by mouth 3 (three) times daily. X 7days     multivitamin with minerals tablet  Take 1 tablet by mouth daily.     Omega 3 1000 MG Caps  Take 1,000 mg by mouth daily.     pantoprazole 40 MG tablet  Commonly known as:  PROTONIX  TAKE 1 TABLET (40 MG TOTAL) BY MOUTH DAILY.     promethazine 12.5 MG tablet  Commonly known as:  PHENERGAN  Take 1 tablet (12.5 mg total) by mouth every 6 (six) hours as needed for nausea or vomiting.     simethicone 80 MG chewable tablet  Commonly known as:  MYLICON  Chew 1 tablet (80 mg total) by mouth 4 (four) times daily as needed for flatulence (also available OTC).         Brief H and P: For complete details please refer to admission H and P, but in briefPatient is a 54 year old female with hypertension, chronic anemia, insulin-dependent diabetes mellitus, hyperlipidemia presented to ED  with multiple episodes of GI bleeding. History was obtained from the patient who reported that she had noticed abdominal cramping with gas pains in the last 2-3 days. This morning, patient went to work around 8 AM and had a large bloody bowel movement after breakfast. She had worsening abdominal cramping but no nausea, vomiting or any fevers or chills. Subsequently patient came home and had another 2 bloody bowel movements, described as dark red blood. She felt dizzy and short of breath on walking, hence came to the ER via EMS for evaluation. In the ED, patient had subsequently 2 more bloody bowel movements. Patient reported that she had a recent ankle sprain and plantar fasciitis. She was started on meloxicam by the urgent care. Patient reports that she has had prior GI bleeding whenever she took NSAIDs but  'never this bad'. ED workup showed, CBC WBC 16.5 hemoglobin 12.7, hematocrit 40.1, no prior baseline H&H, FOBT positive. BMET unremarkable [facility] service requested for admission for GI bleeding.   Hospital Course:  GI bleed: Likely precipitated due to recent NSAIDs, known history of GERD. Patient has prior NSAID related to GI bleeds in the past. Prior colonoscopy in 2014 had shown moderate diverticulosis in the descending and sigmoid colon by Dr. Sharlett Iles. Gastroenterology was consulted. Patient was placed on IV fluids, IV Protonix drip and bowel rest. CT abdomen was negative for ischemic or infectious colitis. Gastroenterology initially started the patient on IV ciprofloxacin and Flagyl however given CT abdomen was negative, proceed with the EGD. Endoscopy was negative for any esophagitis or gastritis or any PUD. GI recommended continuing the oral ciprofloxacin and Flagyl for 7 days. Follow up outpatient in 7-8 weeks with Dr. [name]. Patient is tolerating solid diet without any difficulty.   Hypertension - Currently BP stable, will continue Coreg and added losartan   Type II diabetes mellitus, uncontrolled Patient will continue in [place], metformin and Lantus as per her home regimen, hemoglobin A1c 7.9   Tachycardia - Likely due to dehydration, active GI bleeding, improved.  Right ankle pain: Currently resolved   Day of Discharge BP 135/81 mmHg  Pulse 80  Temp(Src) 98.1 F (36.7 C) (Oral)  Resp 18  Ht 5\' 4"  (1.626 m)  Wt 113.399 kg (250 lb)  BMI 42.89 kg/m2  SpO2 97%  Physical Exam: General: Alert and awake oriented x3 not in any acute distress. HEENT: anicteric sclera, pupils reactive to light and accommodation CVS: S1-S2 clear no murmur rubs or gallops Chest: clear to auscultation bilaterally, no wheezing rales or rhonchi Abdomen: soft nontender, nondistended, normal bowel sounds Extremities: no cyanosis, clubbing or edema noted bilaterally Neuro: Cranial  nerves II-XII intact, no focal neurological deficits   The results of significant diagnostics from this hospitalization (including imaging, microbiology, ancillary and laboratory) are listed below for reference.    LAB RESULTS: Basic Metabolic Panel:  Recent Labs Lab 07/15/15 1247 07/16/15 0325  NA 136 137  K 3.8 3.5  CL 103 101  CO2 21* 27  GLUCOSE 217* 134*  BUN 25* 24*  CREATININE 1.05* 0.99  CALCIUM 9.1 8.6*   Liver Function Tests:  Recent Labs Lab 07/15/15 1247  AST 31  ALT 34  ALKPHOS 50  BILITOT 0.4  PROT 7.6  ALBUMIN 3.8   No results for input(s): LIPASE, AMYLASE in the last 168 hours. No results for input(s): AMMONIA in the last 168 hours. CBC:  Recent Labs Lab 07/16/15 0325 07/17/15 0355  WBC 10.9* 10.4  HGB 10.4* 9.6*  HCT 32.5*  30.2*  MCV 68.3* 68.9*  PLT 280 263   Cardiac Enzymes: No results for input(s): CKTOTAL, CKMB, CKMBINDEX, TROPONINI in the last 168 hours. BNP: Invalid input(s): POCBNP CBG:  Recent Labs Lab 07/17/15 0755 07/17/15 1242  GLUCAP 150* 139*    Significant Diagnostic Studies:  Ct Abdomen Pelvis W Contrast  07/15/2015   CLINICAL DATA:  Acute onset of GI bleeding. Abdominal cramping and dizziness. Shortness of breath. Initial encounter.  EXAM: CT ABDOMEN AND PELVIS WITH CONTRAST  TECHNIQUE: Multidetector CT imaging of the abdomen and pelvis was performed using the standard protocol following bolus administration of intravenous contrast.  CONTRAST:  181mL OMNIPAQUE IOHEXOL 300 MG/ML  SOLN  COMPARISON:  Pelvic ultrasound performed 08/30/2013  FINDINGS: The visualized lung bases are clear.  The liver and spleen are unremarkable in appearance. The gallbladder is within normal limits. The pancreas and left adrenal gland are unremarkable. Scattered postoperative change is noted about the expected location of the right adrenal gland.  A few scattered small bilateral renal cysts are seen, measuring up to 1.7 cm in size. The kidneys  are otherwise unremarkable. There is no evidence of hydronephrosis. No renal or ureteral stones are seen. No perinephric stranding is appreciated.  No free fluid is identified. The small bowel is unremarkable in appearance. The stomach is within normal limits. No acute vascular abnormalities are seen.  The appendix is normal in caliber and contains trace contrast, without evidence of appendicitis. Scattered diverticulosis is noted along the entirety of the colon. Contrast progresses to the level of the sigmoid colon. There is no evidence of acute diverticulitis.  The bladder is mildly distended and grossly unremarkable. Multiple prominent calcified uterine fibroids are seen. No suspicious adnexal masses are seen. No inguinal lymphadenopathy is seen.  No acute osseous abnormalities are identified. Facet disease is noted at the lower lumbar spine.  IMPRESSION: 1. No acute abnormality seen in the abdomen or pelvis. 2. Few scattered small bilateral renal cysts seen. 3. Scattered diverticulosis along the entirety of the colon, without evidence of diverticulitis. 4. Prominent calcified uterine fibroids seen.   Electronically Signed   By: Garald Balding M.D.   On: 07/15/2015 19:30    2D ECHO:   Disposition and Follow-up:    DISPOSITION: Home   DISCHARGE FOLLOW-UP     Follow-up Information    Follow up with [name], MD. Schedule an appointment as soon as possible for a visit on 08/07/2015.   Specialty:  Gastroenterology   Why:  for hospital follow-up, at 10:30 am, CO-PAY, LIST OF MEDS, ARRIVE 15 PRIOR TO APPT, BRING INSURANCE CARD AND ID   Contact information:   [address] [place] [address] [phone]       Follow up with Delman Cheadle, MD. Schedule an appointment as soon as possible for a visit in 2 weeks.   Specialty:  Family Medicine   Why:  for hospital follow-up   Contact information:   [place] [address] [phone]       Follow up with Myrtice Lauth, JESSICA  D., PA-C On 08/07/2015.   Specialty:  Gastroenterology   Why:  at 10:30am   Contact information:   [place] [address] [phone]        Time spent on Discharge: 35 minutes  Signed:   RAI,RIPUDEEP M.D. [facility] 07/17/2015, 1:41 PM Pager: [phone]

## 2015-07-17 NOTE — Plan of Care (Signed)
Problem: Discharge Progression Outcomes Goal: Stools guaiac negative Outcome: Not Met (add Reason) Stool this morning had old blood.

## 2015-07-18 ENCOUNTER — Telehealth: Payer: Self-pay | Admitting: Gastroenterology

## 2015-07-18 NOTE — Telephone Encounter (Signed)
Pt aware to take OTC iron until she follows up with our office

## 2015-07-19 ENCOUNTER — Encounter (HOSPITAL_COMMUNITY): Payer: Self-pay | Admitting: Gastroenterology

## 2015-08-07 ENCOUNTER — Ambulatory Visit (INDEPENDENT_AMBULATORY_CARE_PROVIDER_SITE_OTHER): Payer: Managed Care, Other (non HMO) | Admitting: Gastroenterology

## 2015-08-07 ENCOUNTER — Telehealth: Payer: Self-pay | Admitting: *Deleted

## 2015-08-07 ENCOUNTER — Encounter: Payer: Self-pay | Admitting: Gastroenterology

## 2015-08-07 VITALS — BP 114/78 | HR 84 | Ht 64.0 in | Wt 251.1 lb

## 2015-08-07 DIAGNOSIS — K559 Vascular disorder of intestine, unspecified: Secondary | ICD-10-CM | POA: Diagnosis not present

## 2015-08-07 DIAGNOSIS — D62 Acute posthemorrhagic anemia: Secondary | ICD-10-CM

## 2015-08-07 NOTE — Addendum Note (Signed)
Addended byMarisue Humble K on: 08/07/2015 01:40 PM   Modules accepted: Orders

## 2015-08-07 NOTE — Progress Notes (Signed)
i agree with the note, plan above 

## 2015-08-07 NOTE — Patient Instructions (Signed)
Follow up as needed

## 2015-08-07 NOTE — Telephone Encounter (Signed)
Per Alonza Bogus PA-C, have the patient come to our lab for a CBC Diff. DR. Nino Glow office didn't have a CBC included in the labs they faxed me.  Called patient and advised her to come to our lab, basement level. She said she would do that.

## 2015-08-07 NOTE — Progress Notes (Signed)
     08/07/2015 Carol Mueller [id number] 05-22-1961   History of Present Illness:  This is a pleasant 54 year old female who is previously known to Dr. Sharlett Iles.  Had colonoscopy in 12/2102 at which time she was found to have moderate diverticulosis with excellent prep; repeat recommended in 10 years.  She is here today for hospital follow-up. She was admitted to the hospital at the end of August with sudden onset abdominal cramping and bloody diarrhea. This is presumed to be secondary to ischemic colitis, however, CT scan did not show this or any other cause of her symptoms. She underwent an EGD on August 30 by Dr. [name], which was normal. She was treated with a course of antibiotics (cipro and flagyl) for the presumed ischemic colitis and was placed on iron supplements due to a 3 g drop in her hemoglobin. She is here today stating that she is feeling much better. Her stools have actually just become formed again over the last 5 days or so (says that the metformin gives her occasional loose stools anyway). She is not seeing any blood in her stools and denies any significant pain. She saw her endocrinologist on Monday and had labs including a CBC drawn there. We will obtain those results.   Current Medications, Allergies, Past Medical History, Past Surgical History, Family History and Social History were reviewed in [employer] record.   Physical Exam: BP 114/78 mmHg  Pulse 84  Ht 5\' 4"  (1.626 m)  Wt 251 lb 2 oz (113.91 kg)  BMI 43.08 kg/m2 General: Well developed black female in no acute distress Head: Normocephalic and atraumatic Eyes: Sclerae anicteric, conjunctiva pink  Ears: Normal auditory acuity Lungs: Clear throughout to auscultation Heart: Regular rate and rhythm Abdomen: Soft, non-distended.  Normal bowel sounds.  Non-tender. Musculoskeletal: Symmetrical with no gross deformities  Extremities: No edema  Neurological: Alert oriented x 4, grossly  non-focal Psychological:  Alert and cooperative. Normal mood and affect  Assessment and Recommendations: -Ischemic colitis:  Presumed diagnosis during recent hospital stay for abdominal pain and bloody diarrhea.  Treated with antibiotics.  Symptoms mostly resolved at this point.   -Blood loss anemia:  Acute drop in Hgb seen during hospital stay.  Was placed on iron supplements.  Patient had CBC performed earlier this week at endocrinology so we will obtain those results and determine if she needs to continue iron.

## 2015-08-08 ENCOUNTER — Other Ambulatory Visit (INDEPENDENT_AMBULATORY_CARE_PROVIDER_SITE_OTHER): Payer: Managed Care, Other (non HMO)

## 2015-08-08 DIAGNOSIS — K559 Vascular disorder of intestine, unspecified: Secondary | ICD-10-CM | POA: Diagnosis not present

## 2015-08-08 DIAGNOSIS — D62 Acute posthemorrhagic anemia: Secondary | ICD-10-CM

## 2015-08-08 LAB — CBC WITH DIFFERENTIAL/PLATELET
Basophils Absolute: 0 10*3/uL (ref 0.0–0.1)
Basophils Relative: 0.4 % (ref 0.0–3.0)
EOS ABS: 0.9 10*3/uL — AB (ref 0.0–0.7)
Eosinophils Relative: 9.1 % — ABNORMAL HIGH (ref 0.0–5.0)
HCT: 38.8 % (ref 36.0–46.0)
HEMOGLOBIN: 12.2 g/dL (ref 12.0–15.0)
LYMPHS ABS: 3.5 10*3/uL (ref 0.7–4.0)
Lymphocytes Relative: 36.2 % (ref 12.0–46.0)
MCHC: 31.4 g/dL (ref 30.0–36.0)
MCV: 70.8 fl — ABNORMAL LOW (ref 78.0–100.0)
MONO ABS: 0.7 10*3/uL (ref 0.1–1.0)
Monocytes Relative: 7.2 % (ref 3.0–12.0)
NEUTROS PCT: 47.1 % (ref 43.0–77.0)
Neutro Abs: 4.5 10*3/uL (ref 1.4–7.7)
Platelets: 409 10*3/uL — ABNORMAL HIGH (ref 150.0–400.0)
RBC: 5.47 Mil/uL — AB (ref 3.87–5.11)
RDW: 18.9 % — AB (ref 11.5–15.5)
WBC: 9.6 10*3/uL (ref 4.0–10.5)

## 2015-09-10 ENCOUNTER — Encounter: Payer: Self-pay | Admitting: Gastroenterology

## 2015-10-25 ENCOUNTER — Other Ambulatory Visit: Payer: Self-pay

## 2015-10-25 DIAGNOSIS — Z1231 Encounter for screening mammogram for malignant neoplasm of breast: Secondary | ICD-10-CM

## 2015-11-05 ENCOUNTER — Ambulatory Visit (INDEPENDENT_AMBULATORY_CARE_PROVIDER_SITE_OTHER): Payer: Managed Care, Other (non HMO) | Admitting: Family Medicine

## 2015-11-05 VITALS — BP 126/84 | HR 93 | Temp 98.7°F | Resp 20 | Ht 64.0 in | Wt 247.6 lb

## 2015-11-05 DIAGNOSIS — J209 Acute bronchitis, unspecified: Secondary | ICD-10-CM

## 2015-11-05 DIAGNOSIS — J019 Acute sinusitis, unspecified: Secondary | ICD-10-CM

## 2015-11-05 DIAGNOSIS — R05 Cough: Secondary | ICD-10-CM | POA: Diagnosis not present

## 2015-11-05 DIAGNOSIS — R059 Cough, unspecified: Secondary | ICD-10-CM

## 2015-11-05 MED ORDER — FLUTICASONE PROPIONATE 50 MCG/ACT NA SUSP: 2.0000 | Freq: Every day | NASAL | Status: DC

## 2015-11-05 MED ORDER — BENZONATATE 100 MG PO CAPS: ORAL_CAPSULE | ORAL | Status: DC

## 2015-11-05 MED ORDER — HYDROCODONE-HOMATROPINE 5-1.5 MG/5ML PO SYRP: 5.0000 mL | ORAL_SOLUTION | ORAL | Status: DC | PRN

## 2015-11-05 MED ORDER — AMOXICILLIN 875 MG PO TABS: 875.0000 mg | ORAL_TABLET | Freq: Two times a day (BID) | ORAL | Status: DC

## 2015-11-05 NOTE — Patient Instructions (Addendum)
  Drink plenty of fluids and get enough rest  Continue your diabetic medications and monitoring his sugars  Take Augmentin 875 one twice daily for infection ( amoxicillin/clavulanate)  Use fluticasone nose spray 2 sprays each nostril twice daily for 4 days, then once daily  Take benzonatate cough pills one or 2 pills 3 times daily as needed for daytime cough. This is not sedating  Take Hycodan cough syrup 1 teaspoon every 4-6 hours as needed for cough. This is sedating, and best use at nighttime.  Return if not improving

## 2015-11-05 NOTE — Progress Notes (Signed)
Patient ID: Carol Mueller, female    DOB: 06-Oct-1961  Age: 54 y.o. MRN: [id number]  Chief Complaint  Patient presents with  . Sinusitis    started last month  . Sore Throat  . Cough  . Headache    Subjective:   54 year old lady who has a history of first retract problems for the past month. She flew to [place] for vacation and about November 20 she started getting sick with a respiratory tract infection. She had sinus pressure and drainage and cough. The cough persisted and gradually abated. That it is common gone a little bit but this week is come back with a vengeance. She has a lot of head congestion and discomfort. She doesn't have much in way of a sore throat. No documented fevers but she has felt sweaty at night at times. She has been coughing up a a lot of purulent mucus.  She is diabetic and this morning her blood sugar was 89. She monitors that carefully.  Current allergies, medications, problem list, past/family and social histories reviewed.  Objective:  BP 126/84 mmHg  Pulse 93  Temp(Src) 98.7 F (37.1 C) (Oral)  Resp 20  Ht 5\' 4"  (1.626 m)  Wt 247 lb 9.6 oz (112.311 kg)  BMI 42.48 kg/m2  SpO2 95%  No major acute distress. Her TMs are normal. Throat has a little erythema on the right. Without significant nodes. Chest is clear to auscultation. Heart regular without murmurs.   Assessment & Plan:   Assessment: 1. Acute rhinosinusitis   2. Acute bronchitis, unspecified organism   3. Cough       Plan: Sinusitis/bronchitis which I will treat with antibiotics as is been off and on for months. No labs at this time today.  No orders of the defined types were placed in this encounter.    Meds ordered this encounter  Medications  . fluticasone (FLONASE) 50 MCG/ACT nasal spray    Sig: Place 2 sprays into both nostrils daily.    Dispense:  16 g    Refill:  0  . amoxicillin (AMOXIL) 875 MG tablet    Sig: Take 1 tablet (875 mg total) by mouth 2 (two) times daily.    Dispense:  20 tablet    Refill:  0  . benzonatate (TESSALON PERLES) 100 MG capsule    Sig: Take 1 or 2 pills 3 times daily as needed for cough    Dispense:  30 capsule    Refill:  0  . HYDROcodone-homatropine (HYCODAN) 5-1.5 MG/5ML syrup    Sig: Take 5 mLs by mouth every 4 (four) hours as needed.    Dispense:  120 mL    Refill:  0         Patient Instructions   Drink plenty of fluids and get enough rest  Continue your diabetic medications and monitoring his sugars  Take Augmentin 875 one twice daily for infection ( amoxicillin/clavulanate)  Use fluticasone nose spray 2 sprays each nostril twice daily for 4 days, then once daily  Take benzonatate cough pills one or 2 pills 3 times daily as needed for daytime cough. This is not sedating  Take Hycodan cough syrup 1 teaspoon every 4-6 hours as needed for cough. This is sedating, and best use at nighttime.  Return if not improving     Return if symptoms worsen or fail to improve.   Welles Walthall, MD 11/05/2015

## 2015-11-13 ENCOUNTER — Ambulatory Visit (INDEPENDENT_AMBULATORY_CARE_PROVIDER_SITE_OTHER): Payer: Managed Care, Other (non HMO)

## 2015-11-13 ENCOUNTER — Ambulatory Visit (INDEPENDENT_AMBULATORY_CARE_PROVIDER_SITE_OTHER): Payer: Managed Care, Other (non HMO) | Admitting: Physician Assistant

## 2015-11-13 VITALS — BP 120/82 | HR 92 | Temp 98.5°F | Resp 18 | Ht 64.0 in | Wt 246.0 lb

## 2015-11-13 DIAGNOSIS — K089 Disorder of teeth and supporting structures, unspecified: Secondary | ICD-10-CM

## 2015-11-13 DIAGNOSIS — J019 Acute sinusitis, unspecified: Secondary | ICD-10-CM | POA: Diagnosis not present

## 2015-11-13 DIAGNOSIS — M542 Cervicalgia: Secondary | ICD-10-CM

## 2015-11-13 MED ORDER — HYDROCODONE-ACETAMINOPHEN 5-325 MG PO TABS: 1.0000 | ORAL_TABLET | Freq: Four times a day (QID) | ORAL | Status: DC | PRN

## 2015-11-13 MED ORDER — CEFDINIR 300 MG PO CAPS: 600.0000 mg | ORAL_CAPSULE | Freq: Every day | ORAL | Status: DC

## 2015-11-13 MED ORDER — CYCLOBENZAPRINE HCL 10 MG PO TABS: 10.0000 mg | ORAL_TABLET | Freq: Three times a day (TID) | ORAL | Status: DC | PRN

## 2015-11-13 MED ORDER — IPRATROPIUM BROMIDE 0.03 % NA SOLN: 2.0000 | Freq: Two times a day (BID) | NASAL | Status: DC
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
## 2015-11-13 NOTE — Patient Instructions (Signed)
Acute Torticollis °Torticollis is a condition in which the muscles of the neck tighten (contract) abnormally, causing the neck to twist and the head to move into an unnatural position. Torticollis that develops suddenly is called acute torticollis. If torticollis becomes chronic and is left untreated, the face and neck can become deformed. °CAUSES °This condition may be caused by: °· Sleeping in an awkward position (common). °· Extending or twisting the neck muscles beyond their normal position. °· Infection. °In some cases, the cause may not be known. °SYMPTOMS °Symptoms of this condition include: °· An unnatural position of the head. °· Neck pain. °· A limited ability to move the neck. °· Twisting of the neck to one side. °DIAGNOSIS °This condition is diagnosed with a physical exam. You may also have imaging tests, such as an X-ray, CT scan, or MRI. °TREATMENT °Treatment for this condition involves trying to relax the neck muscles. It may include: °· Medicines or shots. °· Physical therapy. °· Surgery. This may be done in severe cases. °HOME CARE INSTRUCTIONS °· Take medicines only as directed by your health care provider. °· Do stretching exercises and massage your neck as directed by your health care provider. °· Keep all follow-up visits as directed by your health care provider. This is important. °SEEK MEDICAL CARE IF: °· You develop a fever. °SEEK IMMEDIATE MEDICAL CARE IF: °· You develop difficulty breathing. °· You develop noisy breathing (stridor). °· You start drooling. °· You have trouble swallowing or have pain with swallowing. °· You develop numbness or weakness in your hands or feet. °· You have changes in your speech, understanding, or vision. °· Your pain gets worse. °  °This information is not intended to replace advice given to you by your health care provider. Make sure you discuss any questions you have with your health care provider. °  °Document Released: 10/30/2000 Document Revised:  03/19/2015 Document Reviewed: 10/29/2014 °Elsevier Interactive Patient Education ©2016 [employer] ° °

## 2015-11-26 ENCOUNTER — Ambulatory Visit: Payer: Managed Care, Other (non HMO)

## 2016-06-05 ENCOUNTER — Inpatient Hospital Stay (HOSPITAL_COMMUNITY): Payer: Managed Care, Other (non HMO)

## 2016-06-05 ENCOUNTER — Encounter (HOSPITAL_COMMUNITY): Payer: Self-pay | Admitting: *Deleted

## 2016-06-05 ENCOUNTER — Telehealth: Payer: Self-pay | Admitting: Gastroenterology

## 2016-06-05 ENCOUNTER — Inpatient Hospital Stay (HOSPITAL_COMMUNITY)
Admission: EM | Admit: 2016-06-05 | Discharge: 2016-06-09 | DRG: 378 | Disposition: A | Discharge status: Home / Self Care | Payer: Managed Care, Other (non HMO) | Attending: Internal Medicine | Admitting: Internal Medicine

## 2016-06-05 ENCOUNTER — Emergency Department (HOSPITAL_COMMUNITY): Payer: Managed Care, Other (non HMO)

## 2016-06-05 DIAGNOSIS — Z9049 Acquired absence of other specified parts of digestive tract: Secondary | ICD-10-CM | POA: Diagnosis not present

## 2016-06-05 DIAGNOSIS — Z833 Family history of diabetes mellitus: Secondary | ICD-10-CM | POA: Diagnosis not present

## 2016-06-05 DIAGNOSIS — K5731 Diverticulosis of large intestine without perforation or abscess with bleeding: Secondary | ICD-10-CM | POA: Diagnosis not present

## 2016-06-05 DIAGNOSIS — K625 Hemorrhage of anus and rectum: Secondary | ICD-10-CM | POA: Diagnosis not present

## 2016-06-05 DIAGNOSIS — E1159 Type 2 diabetes mellitus with other circulatory complications: Secondary | ICD-10-CM | POA: Diagnosis present

## 2016-06-05 DIAGNOSIS — Z8249 Family history of ischemic heart disease and other diseases of the circulatory system: Secondary | ICD-10-CM

## 2016-06-05 DIAGNOSIS — E785 Hyperlipidemia, unspecified: Secondary | ICD-10-CM | POA: Diagnosis present

## 2016-06-05 DIAGNOSIS — IMO0002 Reserved for concepts with insufficient information to code with codable children: Secondary | ICD-10-CM | POA: Diagnosis present

## 2016-06-05 DIAGNOSIS — Z794 Long term (current) use of insulin: Secondary | ICD-10-CM | POA: Diagnosis not present

## 2016-06-05 DIAGNOSIS — IMO0001 Reserved for inherently not codable concepts without codable children: Secondary | ICD-10-CM | POA: Diagnosis present

## 2016-06-05 DIAGNOSIS — E1165 Type 2 diabetes mellitus with hyperglycemia: Secondary | ICD-10-CM | POA: Diagnosis present

## 2016-06-05 DIAGNOSIS — E248 Other Cushing's syndrome: Secondary | ICD-10-CM | POA: Diagnosis present

## 2016-06-05 DIAGNOSIS — K921 Melena: Secondary | ICD-10-CM | POA: Diagnosis not present

## 2016-06-05 DIAGNOSIS — I10 Essential (primary) hypertension: Secondary | ICD-10-CM | POA: Diagnosis present

## 2016-06-05 DIAGNOSIS — Z6841 Body Mass Index (BMI) 40.0 and over, adult: Secondary | ICD-10-CM

## 2016-06-05 DIAGNOSIS — E282 Polycystic ovarian syndrome: Secondary | ICD-10-CM | POA: Diagnosis present

## 2016-06-05 DIAGNOSIS — K922 Gastrointestinal hemorrhage, unspecified: Secondary | ICD-10-CM

## 2016-06-05 DIAGNOSIS — E669 Obesity, unspecified: Secondary | ICD-10-CM | POA: Diagnosis present

## 2016-06-05 DIAGNOSIS — D62 Acute posthemorrhagic anemia: Secondary | ICD-10-CM | POA: Diagnosis present

## 2016-06-05 LAB — COMPREHENSIVE METABOLIC PANEL
ALK PHOS: 49 U/L (ref 38–126)
ALT: 20 U/L (ref 14–54)
ANION GAP: 9 (ref 5–15)
AST: 19 U/L (ref 15–41)
Albumin: 4.5 g/dL (ref 3.5–5.0)
BILIRUBIN TOTAL: 0.6 mg/dL (ref 0.3–1.2)
BUN: 23 mg/dL — ABNORMAL HIGH (ref 6–20)
CALCIUM: 9.3 mg/dL (ref 8.9–10.3)
CO2: 28 mmol/L (ref 22–32)
CREATININE: 0.84 mg/dL (ref 0.44–1.00)
Chloride: 101 mmol/L (ref 101–111)
GFR calc non Af Amer: >60 mL/min (ref >60)
GLUCOSE: 131 mg/dL — AB (ref 65–99)
Potassium: 3.7 mmol/L (ref 3.5–5.1)
Sodium: 138 mmol/L (ref 135–145)
TOTAL PROTEIN: 8.3 g/dL — AB (ref 6.5–8.1)

## 2016-06-05 LAB — PROTIME-INR
INR: 1.01 (ref 0.00–1.49)
Prothrombin Time: 13.5 seconds (ref 11.6–15.2)

## 2016-06-05 LAB — CBC
HCT: 45.2 % (ref 36.0–46.0)
HEMOGLOBIN: 14 g/dL (ref 12.0–15.0)
MCH: 21.7 pg — AB (ref 26.0–34.0)
MCHC: 31 g/dL (ref 30.0–36.0)
MCV: 70.1 fL — AB (ref 78.0–100.0)
PLATELETS: 344 10*3/uL (ref 150–400)
RBC: 6.45 MIL/uL — AB (ref 3.87–5.11)
RDW: 17.1 % — AB (ref 11.5–15.5)
WBC: 9.8 10*3/uL (ref 4.0–10.5)

## 2016-06-05 LAB — I-STAT CG4 LACTIC ACID, ED: LACTIC ACID, VENOUS: 0.95 mmol/L (ref 0.5–1.9)

## 2016-06-05 LAB — GLUCOSE, CAPILLARY
GLUCOSE-CAPILLARY: 82 mg/dL (ref 65–99)
Glucose-Capillary: 133 mg/dL — ABNORMAL HIGH (ref 65–99)

## 2016-06-05 LAB — POC OCCULT BLOOD, ED: FECAL OCCULT BLD: POSITIVE — AB

## 2016-06-05 LAB — TYPE AND SCREEN
ABO/RH(D): O POS
Antibody Screen: NEGATIVE

## 2016-06-05 LAB — TSH: TSH: 0.628 u[IU]/mL (ref 0.350–4.500)

## 2016-06-05 MED ORDER — FENTANYL CITRATE (PF) 100 MCG/2ML IJ SOLN
INTRAMUSCULAR | Status: AC
  Filled 2016-06-05: qty 4

## 2016-06-05 MED ORDER — SODIUM CHLORIDE 0.9 % IV SOLN
INTRAVENOUS | Status: DC
  Administered 2016-06-05 – 2016-06-08 (×6): via INTRAVENOUS

## 2016-06-05 MED ORDER — CARVEDILOL 3.125 MG PO TABS
3.1250 mg | ORAL_TABLET | Freq: Two times a day (BID) | ORAL | Status: DC
  Administered 2016-06-05 – 2016-06-09 (×8): 3.125 mg via ORAL
  Filled 2016-06-05 (×8): qty 1

## 2016-06-05 MED ORDER — ONDANSETRON HCL 4 MG/2ML IJ SOLN: 4.0000 mg | Freq: Four times a day (QID) | INTRAMUSCULAR | Status: DC | PRN

## 2016-06-05 MED ORDER — LIDOCAINE HCL 1 % IJ SOLN
INTRAMUSCULAR | Status: AC
  Filled 2016-06-05: qty 20

## 2016-06-05 MED ORDER — FENTANYL CITRATE (PF) 100 MCG/2ML IJ SOLN
INTRAMUSCULAR | Status: AC | PRN
  Administered 2016-06-05: 50 ug via INTRAVENOUS

## 2016-06-05 MED ORDER — HYDROCODONE-ACETAMINOPHEN 5-325 MG PO TABS
1.0000 | ORAL_TABLET | ORAL | Status: DC | PRN
  Administered 2016-06-05 – 2016-06-06 (×2): 2 via ORAL
  Filled 2016-06-05 (×2): qty 2

## 2016-06-05 MED ORDER — IOPAMIDOL (ISOVUE-300) INJECTION 61%
100.0000 mL | Freq: Once | INTRAVENOUS | Status: AC | PRN
  Administered 2016-06-05: 24 mL via INTRA_ARTERIAL

## 2016-06-05 MED ORDER — ACETAMINOPHEN 325 MG PO TABS
650.0000 mg | ORAL_TABLET | Freq: Four times a day (QID) | ORAL | Status: DC | PRN
  Administered 2016-06-07 – 2016-06-09 (×2): 650 mg via ORAL
  Filled 2016-06-05 (×2): qty 2

## 2016-06-05 MED ORDER — ONDANSETRON HCL 4 MG PO TABS: 4.0000 mg | ORAL_TABLET | Freq: Four times a day (QID) | ORAL | Status: DC | PRN

## 2016-06-05 MED ORDER — IOPAMIDOL (ISOVUE-300) INJECTION 61%
100.0000 mL | Freq: Once | INTRAVENOUS | Status: AC | PRN
  Administered 2016-06-05: 50 mL via INTRA_ARTERIAL

## 2016-06-05 MED ORDER — INSULIN ASPART 100 UNIT/ML ~~LOC~~ SOLN
0.0000 [IU] | Freq: Three times a day (TID) | SUBCUTANEOUS | Status: DC
  Administered 2016-06-06 – 2016-06-07 (×3): 1 [IU] via SUBCUTANEOUS
  Administered 2016-06-07 (×2): 2 [IU] via SUBCUTANEOUS
  Administered 2016-06-08 (×2): 1 [IU] via SUBCUTANEOUS

## 2016-06-05 MED ORDER — MIDAZOLAM HCL 2 MG/2ML IJ SOLN
INTRAMUSCULAR | Status: AC
Start: 2016-06-05 — End: 2016-06-06
  Filled 2016-06-05: qty 6

## 2016-06-05 MED ORDER — ACETAMINOPHEN 650 MG RE SUPP: 650.0000 mg | Freq: Four times a day (QID) | RECTAL | Status: DC | PRN

## 2016-06-05 MED ORDER — SODIUM CHLORIDE 0.9 % IV SOLN
Freq: Once | INTRAVENOUS | Status: DC
Start: 2016-06-05 — End: 2016-06-05

## 2016-06-05 MED ORDER — SODIUM CHLORIDE 0.9 % IV BOLUS (SEPSIS): 1000.0000 mL | Freq: Once | INTRAVENOUS | Status: DC

## 2016-06-05 MED ORDER — LIDOCAINE HCL 1 % IJ SOLN
INTRAMUSCULAR | Status: AC | PRN
  Administered 2016-06-05: 10 mL via INTRADERMAL

## 2016-06-05 MED ORDER — INSULIN GLARGINE 100 UNIT/ML ~~LOC~~ SOLN
20.0000 [IU] | Freq: Every day | SUBCUTANEOUS | Status: DC
  Administered 2016-06-05 – 2016-06-08 (×4): 20 [IU] via SUBCUTANEOUS
  Filled 2016-06-05 (×4): qty 0.2

## 2016-06-05 MED ORDER — ADULT MULTIVITAMIN W/MINERALS CH
1.0000 | ORAL_TABLET | Freq: Every day | ORAL | Status: DC
  Administered 2016-06-05 – 2016-06-06 (×2): 1 via ORAL
  Filled 2016-06-05 (×5): qty 1

## 2016-06-05 MED ORDER — IOPAMIDOL (ISOVUE-300) INJECTION 61%
100.0000 mL | Freq: Once | INTRAVENOUS | Status: AC | PRN
  Administered 2016-06-05: 35 mL via INTRA_ARTERIAL

## 2016-06-05 MED ORDER — MIDAZOLAM HCL 2 MG/2ML IJ SOLN
INTRAMUSCULAR | Status: AC | PRN
  Administered 2016-06-05: 1 mg via INTRAVENOUS
  Administered 2016-06-05: 0.5 mg via INTRAVENOUS
  Administered 2016-06-05 (×2): 1 mg via INTRAVENOUS

## 2016-06-05 MED ORDER — IOPAMIDOL (ISOVUE-370) INJECTION 76%
100.0000 mL | Freq: Once | INTRAVENOUS | Status: AC | PRN
  Administered 2016-06-05: 100 mL via INTRAVENOUS

## 2016-06-05 MED ORDER — MORPHINE SULFATE (PF) 2 MG/ML IV SOLN: 1.0000 mg | INTRAVENOUS | Status: DC | PRN

## 2016-06-05 NOTE — Telephone Encounter (Signed)
Ok, thanks.

## 2016-06-05 NOTE — ED Provider Notes (Signed)
CSN: [id number]     Arrival date & time 06/05/16  1105 History   First MD Initiated Contact with Patient 06/05/16 1219     Chief Complaint  Patient presents with  . Blood In Stools     (Consider location/radiation/quality/duration/timing/severity/associated sxs/prior Treatment) HPI Carol Mueller is a 55 y.o. female with history of polycystic ovarian disease, Cushing's syndrome, diabetes, hypertension, hyperlipidemia, presents to emergency department complaining of bloody stools. Patient states that she woke up this morning and has had 5 stools, states "most of the commode gets filled up with blood." She reports some flatulence and abdominal discomfort, worse in the left lower quadrant. She reports similar episode a year ago, that time was thought to have ischemic colitis. She has been followed up closely by gastroenterology and primary care doctor. She denies any symptoms prior to today. No treatment prior to coming in. Denies dizziness or lightheadedness. No nausea or vomiting. She reports history of chronic diarrhea.  Past Medical History  Diagnosis Date  . Anemia   . Hypertension   . Polycystic ovary disease   . Cushing's syndrome ([place])     due to adrenal tumor  . Diabetes mellitus without complication (H[facility])     insulin-dependent  . Hyperlipidemia    Past Surgical History  Procedure Laterality Date  . Cholecystectomy    . Adrenalectomy  1996  . Esophagogastroduodenoscopy (egd) with propofol N/A 07/16/2015    Procedure: ESOPHAGOGASTRODUODENOSCOPY (EGD) WITH PROPOFOL;  Surgeon: [name], MD;  Location: [facility] END[facility]COPY;  Service: Endoscopy;  Laterality: N/A;   Family History  Problem Relation Age of Onset  . Hypertension Mother   . Cancer Mother     ?uterine or ovarian cancer  . Hypertension Father   . Diabetes Father   . Hypertension Sister   . Diabetes Sister   . Hypertension Brother   . Diabetes Brother   . Hypertension Maternal Grandfather   . Diabetes Paternal  Grandmother   . Colon cancer Neg Hx   . Breast cancer Maternal Grandmother    Social History  Substance Use Topics  . Smoking status: Never Smoker   . Smokeless tobacco: Never Used  . Alcohol Use: No   OB History    No data available     Review of Systems  Constitutional: Negative for fever and chills.  Respiratory: Negative for cough, chest tightness and shortness of breath.   Cardiovascular: Negative for chest pain, palpitations and leg swelling.  Gastrointestinal: Positive for abdominal pain and blood in stool. Negative for nausea, vomiting and diarrhea.  Genitourinary: Negative for dysuria, flank pain and vaginal bleeding.  Musculoskeletal: Negative for myalgias, arthralgias, neck pain and neck stiffness.  Skin: Negative for rash.  Neurological: Negative for dizziness, weakness and headaches.  All other systems reviewed and are negative.     Allergies  Norvasc and Nsaids  Home Medications   Prior to Admission medications   Medication Sig Start Date End Date Taking? Authorizing Provider  Canagliflozin (INVOKANA) 100 MG TABS Take 100 mg by mouth daily.    Yes Historical Provider, MD  carvedilol (COREG) 3.125 MG tablet Take 3.125 mg by mouth 2 (two) times daily with a meal.   Yes Historical Provider, MD  Insulin Glargine (LANTUS SOLOSTAR) 100 UNIT/ML SOPN Inject 20 to 40 units into the skin HS. Increase dose by 2 units for readings above 140. PATIENT NEEDS OFFICE VISIT FOR ADDITIONAL REFILLS 09/19/13  Yes Theda Sers, PA-C  Insulin Pen Needle (B-D ULTRAFINE III SHORT  PEN) 31G X 8 MM MISC Use to inject insulin every night. PATIENT NEEDS OFFICE VISIT FOR ADDITIONAL REFILLS 12/11/13  Yes Mancel Bale, PA-C  losartan-hydrochlorothiazide (HYZAAR) 100-25 MG per tablet TAKE 1 TABLET BY MOUTH DAILY. NEEDS OFFICE VISIT 03/20/13  Yes Shawnee Knapp, MD  metFORMIN (GLUCOPHAGE) 1000 MG tablet Take 1 tablet (1,000 mg total) by mouth 2 (two) times daily with a meal. PATIENT NEEDS OFFICE VISIT  FOR ADDITIONAL REFILLS 09/07/13  Yes Theda Sers, PA-C  Multiple Vitamins-Minerals (MULTIVITAMIN WITH MINERALS) tablet Take 1 tablet by mouth daily.   Yes Historical Provider, MD  Omega 3 1000 MG CAPS Take 1,000 mg by mouth daily.   Yes Historical Provider, MD  Specialty Vitamins Products (ADVANCED COLLAGEN PO) Take 1 scoop by mouth daily.   Yes Historical Provider, MD  ipratropium (ATROVENT) 0.03 % nasal spray Place 2 sprays into both nostrils 2 (two) times daily. Patient not taking: Reported on 06/05/2016 11/13/15   Harrison Mons, PA-C  pantoprazole (PROTONIX) 40 MG tablet TAKE 1 TABLET (40 MG TOTAL) BY MOUTH DAILY. Patient not taking: Reported on 11/05/2015 09/11/14   Mancel Bale, PA-C  simethicone (MYLICON) 80 MG chewable tablet Chew 1 tablet (80 mg total) by mouth 4 (four) times daily as needed for flatulence (also available OTC). Patient not taking: Reported on 11/05/2015 07/17/15   Ripudeep K Rai, MD   BP 132/82 mmHg  Pulse 91  Temp(Src) 98.8 F (37.1 C) (Oral)  Resp 22  SpO2 95% Physical Exam  Constitutional: She is oriented to person, place, and time. She appears well-developed and well-nourished. No distress.  HENT:  Head: Normocephalic.  Eyes: Conjunctivae are normal.  Neck: Neck supple.  Cardiovascular: Normal rate, regular rhythm and normal heart sounds.   Pulmonary/Chest: Effort normal and breath sounds normal. No respiratory distress. She has no wheezes. She has no rales.  Abdominal: Soft. Bowel sounds are normal. She exhibits no distension. There is tenderness. There is no rebound.  Left lower quadrant tenderness  Genitourinary:  External hemorrhoids present, soft, no bleeding noted from the hemorrhoids. Rectum nontender. Stool is burgundy  Musculoskeletal: She exhibits no edema.  Neurological: She is alert and oriented to person, place, and time.  Skin: Skin is warm and dry.  Psychiatric: She has a normal mood and affect. Her behavior is normal.  Nursing note  and vitals reviewed.   ED Course  Procedures (including critical care time) Labs Review Labs Reviewed  COMPREHENSIVE METABOLIC PANEL - Abnormal; Notable for the following:    Glucose, Bld 131 (*)    BUN 23 (*)    Total Protein 8.3 (*)    All other components within normal limits  CBC - Abnormal; Notable for the following:    RBC 6.45 (*)    MCV 70.1 (*)    MCH 21.7 (*)    RDW 17.1 (*)    All other components within normal limits  POC OCCULT BLOOD, ED - Abnormal; Notable for the following:    Fecal Occult Bld POSITIVE (*)    All other components within normal limits  I-STAT CG4 LACTIC ACID, ED  I-STAT CG4 LACTIC ACID, ED  TYPE AND SCREEN    Imaging Review Ct Cta Abd/pel W/cm &/or W/o Cm  06/05/2016  CLINICAL DATA:  55 year old female with central lower abdominal pain since earlier this morning and bloody stools. EXAM: CTA ABDOMEN AND PELVIS wITHOUT AND WITH CONTRAST TECHNIQUE: Multidetector CT imaging of the abdomen and pelvis was performed using the standard  protocol during bolus administration of intravenous contrast. Multiplanar reconstructed images and MIPs were obtained and reviewed to evaluate the vascular anatomy. CONTRAST:  100 mL Isovue 370 COMPARISON:  CT abdomen/ pelvis 07/15/2015 FINDINGS: VASCULAR Aorta: Normal caliber abdominal aorta. No evidence of aneurysm, dissection or significant atherosclerotic plaque. Celiac: Widely patent and unremarkable. Incidentally, left hepatic artery is replaced to the left gastric artery. SMA: The widely patent unremarkable. Renals: Single dominant renal arteries which are widely patent. No changes of fibromuscular dysplasia. Additionally, there is a small accessory renal artery to the right lower pole. IMA: Patent and unremarkable. Inflow: Patent and unremarkable. Proximal Outflow: Paid unremarkable. Veins: No focal venous abnormality. NON-VASCULAR Lower Chest: The lung bases are clear. Visualized cardiac structures are within normal limits  for size. No pericardial effusion. Unremarkable visualized distal thoracic esophagus. Abdomen: Unremarkable CT appearance of the stomach, duodenum, spleen, left adrenal gland and pancreas. The right adrenal gland is surgically absent. The liver is diffusely low in attenuation consistent with at least at moderate hepatic steatosis. The gallbladder is surgically absent. No intra or extrahepatic biliary ductal dilatation. 2.3 cm circumscribed water attenuation cyst in the interpolar right kidney consistent with a simple cyst. There is a second smaller 1.1 cm cyst also in the interpolar right kidney. Similarly, there is a simple cyst in the lower pole of the left kidney measuring 1.7 cm. No hydronephrosis, nephrolithiasis or enhancing renal lesion. Extensive colonic diverticulosis predominantly involving the descending and sigmoid colon. High attenuation material is present within a diverticulum along the mesenteric border of the proximal descending colon in the region of the splenic flexure (image 54 series 4) high attenuation material can be seen within the adjacent colonic lumen as well. On the venous phase images this material has dispersed consistent with contrast extravasation. No findings to suggest acute diverticulitis. Diverticulosis also involves the cecum and ascending colon. Normal appendix in the right lower quadrant. No small bowel wall thickening or evidence of obstruction. No free fluid or suspicious adenopathy. Pelvis: Large multi fibroid uterus with extensive dystrophic calcifications. The bladder is unremarkable. Unremarkable appearance of the adnexa. No free fluid or suspicious adenopathy. Bones/Soft Tissues: No acute osseous abnormality. Review of the MIP images confirms the above findings. IMPRESSION: VASCULAR 1. Active diverticular bleed in the proximal descending colon just beyond the splenic flexure. 2. No significant atherosclerotic vascular disease. NON VASCULAR 1. Extensive colonic  diverticulosis. No evidence of active diverticulitis. 2. At least moderate hepatic steatosis. 3. Simple renal cysts noted incidentally. 4. Fibroid uterus. 5. 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 These results were called by telephone at the time of interpretation on 06/05/2016 at 2:19 pm to Dr. Jeannett Senior , who verbally acknowledged these results. Signed, Criselda Peaches, MD [facility] Electronically Signed   By: Jacqulynn Cadet M.D.   On: 06/05/2016 14:49   I have personally reviewed and evaluated these images and lab results as part of my medical decision-making.   EKG Interpretation None      MDM   Final diagnoses:  Diverticulosis of colon with hemorrhage   Patient in emergency department with rectal bleeding 5 onset this morning. History of the same. Was diagnosed with possible ischemic colitis, however there was no CT finding on exam at that time. Heme positive stools. She is slightly tachycardic, otherwise hemodynamically stable. Will get labs, type and screen, CT angiogram abdomen and pelvis.   2:42 PM Blood work is stable, hemoglobin is 14. Presented phone call  from interventional radiology, the patient's CT scan showing active diverticular bleed. They will take her and try to embolize it. I discussed with hospitalist, they will admit.  4:01 PM Pt in IR. Spoke with GI, aware.  Filed Vitals:   06/05/16 1545 06/05/16 1550 06/05/16 1556 06/05/16 1559  BP: 138/83 129/87 123/82 115/74  Pulse: 87 88 87 93  Temp:      TempSrc:      Resp: 20 22 22 24   SpO2: 96% 96% 95% 95%     Jeannett Senior, PA-C 06/05/16 [place] Liu, MD 06/05/16 [phone]

## 2016-06-05 NOTE — ED Provider Notes (Signed)
Medical screening examination/treatment/procedure(s) were conducted as a shared visit with non-physician practitioner(s) and myself.  I personally evaluated the patient during the encounter.   EKG Interpretation None      55 year old female who presents with rectal bleeding. She has a history of polycystic ovarian disease, diabetes, and Cushing's syndrome. History of GI bleeding in the past with prior diverticulosis colonoscopy. Recently admitted for rectal bleeding in 2016 with questionable ischemic colitis. States that she has otherwise been in her usual state of health. This morning with 5 episodes of bloody stools with intermittent abdominal cramping. Is not anticoagulated. No fevers, nausea, vomiting, dyspnea, syncope or near syncope. Abdomen is soft and benign. She is hemodynamically stable. With normal hemoglobin. But with bright red blood on rectal exam and one episode of bloody stool in the emergency department. CTA of the abdomen shows diverticulosis with active bleeding. IR to embolize her foci of bleeding. Will admit to hospitalist service after her procedure.   Forde Dandy, MD 06/05/16 1515

## 2016-06-05 NOTE — ED Notes (Signed)
Patient in IR

## 2016-06-05 NOTE — Telephone Encounter (Signed)
Patient is calling to report she has had 3 bloody stools this AM. States her stomach is "gurgling" Reports LLQ pain a couple of days ago but none now. She will go to ED for evaluation. Notified Ellouise Newer that patient has been sent to ED. [name] pt. Hx ischemic colitis.

## 2016-06-05 NOTE — ED Notes (Signed)
Patient transported to CT 

## 2016-06-05 NOTE — H&P (Signed)
History and Physical    Carol Mueller [id number] DOB: April 25, 1961 DOA: 06/05/2016  PCP: Delman Cheadle, MD Patient coming from: Home  Chief Complaint: Rectal bleeding  HPI: Carol Mueller is a 55 y.o. female with medical history significant of diabetes mellitus, HTN and polycystic ovary disease, came into the hospital because of rectal bleeding. Patient reported that she had diarrhea for the past couple days, then he started to have bloody bowel movement this morning, she had 7 bloody BMs this morning so came into the hospital for further evaluation. CT angiography was done and showed active bleeding, IR consulted for further management.  ED Course:  Vitals: Stable Labs: WNL Imaging: CT angio showed active bleeding likely diverticular. Interventions: IV fluids given, interventional radiology consulted.  Review of Systems:  As per HPI otherwise 10 point review of systems negative.    Past Medical History  Diagnosis Date  . Anemia   . Hypertension   . Polycystic ovary disease   . Cushing's syndrome ([place])     due to adrenal tumor  . Diabetes mellitus without complication (HCC)     insulin-dependent  . Hyperlipidemia     Past Surgical History  Procedure Laterality Date  . Cholecystectomy    . Adrenalectomy  1996  . Esophagogastroduodenoscopy (egd) with propofol N/A 07/16/2015    Procedure: ESOPHAGOGASTRODUODENOSCOPY (EGD) WITH PROPOFOL;  Surgeon: [name], MD;  Location: [facility];  Service: Endoscopy;  Laterality: N/A;     reports that she has never smoked. She has never used smokeless tobacco. She reports that she does not drink alcohol or use illicit drugs.  Allergies  Allergen Reactions  . Norvasc [Amlodipine Besylate] Swelling    ANGIOEDEMA  . Nsaids Other (See Comments)    GI upset/ GI bleed    Family History  Problem Relation Age of Onset  . Hypertension Mother   . Cancer Mother     ?uterine or ovarian cancer  . Hypertension Father   . Diabetes  Father   . Hypertension Sister   . Diabetes Sister   . Hypertension Brother   . Diabetes Brother   . Hypertension Maternal Grandfather   . Diabetes Paternal Grandmother   . Colon cancer Neg Hx   . Breast cancer Maternal Grandmother     Prior to Admission medications   Medication Sig Start Date End Date Taking? Authorizing Provider  Canagliflozin (INVOKANA) 100 MG TABS Take 100 mg by mouth daily.    Yes Historical Provider, MD  carvedilol (COREG) 3.125 MG tablet Take 3.125 mg by mouth 2 (two) times daily with a meal.   Yes Historical Provider, MD  Insulin Glargine (LANTUS SOLOSTAR) 100 UNIT/ML SOPN Inject 20 to 40 units into the skin HS. Increase dose by 2 units for readings above 140. PATIENT NEEDS OFFICE VISIT FOR ADDITIONAL REFILLS 09/19/13  Yes Eleanore Kurtis Bushman, PA-C  Insulin Pen Needle (B-D ULTRAFINE III SHORT PEN) 31G X 8 MM MISC Use to inject insulin every night. PATIENT NEEDS OFFICE VISIT FOR ADDITIONAL REFILLS 12/11/13  Yes Mancel Bale, PA-C  losartan-hydrochlorothiazide (HYZAAR) 100-25 MG per tablet TAKE 1 TABLET BY MOUTH DAILY. NEEDS OFFICE VISIT 03/20/13  Yes Shawnee Knapp, MD  metFORMIN (GLUCOPHAGE) 1000 MG tablet Take 1 tablet (1,000 mg total) by mouth 2 (two) times daily with a meal. PATIENT NEEDS OFFICE VISIT FOR ADDITIONAL REFILLS 09/07/13  Yes Theda Sers, PA-C  Multiple Vitamins-Minerals (MULTIVITAMIN WITH MINERALS) tablet Take 1 tablet by mouth daily.   Yes Historical Provider,  MD  Omega 3 1000 MG CAPS Take 1,000 mg by mouth daily.   Yes Historical Provider, MD  Specialty Vitamins Products (ADVANCED COLLAGEN PO) Take 1 scoop by mouth daily.   Yes Historical Provider, MD  ipratropium (ATROVENT) 0.03 % nasal spray Place 2 sprays into both nostrils 2 (two) times daily. Patient not taking: Reported on 06/05/2016 11/13/15   Harrison Mons, PA-C  pantoprazole (PROTONIX) 40 MG tablet TAKE 1 TABLET (40 MG TOTAL) BY MOUTH DAILY. Patient not taking: Reported on 11/05/2015 09/11/14    Mancel Bale, PA-C  simethicone (MYLICON) 80 MG chewable tablet Chew 1 tablet (80 mg total) by mouth 4 (four) times daily as needed for flatulence (also available OTC). Patient not taking: Reported on 11/05/2015 07/17/15   Ripudeep Krystal Eaton, MD    Physical Exam: Constitutional: NAD, calm, comfortable Filed Vitals:   06/05/16 1117 06/05/16 1209 06/05/16 1330  BP: 155/100 150/109 132/82  Pulse: 105 101 91  Temp: 98.8 F (37.1 C)    TempSrc: Oral    Resp: 18 21 22   SpO2: 96% 97% 95%   Eyes: PERRL, lids and conjunctivae normal ENMT: Mucous membranes are moist. Posterior pharynx clear of any exudate or lesions.Normal dentition.  Neck: normal, supple, no masses, no thyromegaly Respiratory: clear to auscultation bilaterally, no wheezing, no crackles. Normal respiratory effort. No accessory muscle use.  Cardiovascular: Regular rate and rhythm, no murmurs / rubs / gallops. No extremity edema. 2+ pedal pulses. No carotid bruits.  Abdomen: no tenderness, no masses palpated. No hepatosplenomegaly. Bowel sounds positive.  Musculoskeletal: no clubbing / cyanosis. No joint deformity upper and lower extremities. Good ROM, no contractures. Normal muscle tone.  Skin: no rashes, lesions, ulcers. No induration Neurologic: CN 2-12 grossly intact. Sensation intact, DTR normal. Strength 5/5 in all 4.  Psychiatric: Normal judgment and insight. Alert and oriented x 3. Normal mood.   Labs on Admission: I have personally reviewed following labs and imaging studies  CBC:  Recent Labs Lab 06/05/16 1147  WBC 9.8  HGB 14.0  HCT 45.2  MCV 70.1*  PLT [id number]   Basic Metabolic Panel:  Recent Labs Lab 06/05/16 1147  NA 138  K 3.7  CL 101  CO2 28  GLUCOSE 131*  BUN 23*  CREATININE 0.84  CALCIUM 9.3   GFR: CrCl cannot be calculated (Unknown ideal weight.). Liver Function Tests:  Recent Labs Lab 06/05/16 1147  AST 19  ALT 20  ALKPHOS 49  BILITOT 0.6  PROT 8.3*  ALBUMIN 4.5   No results for  input(s): LIPASE, AMYLASE in the last 168 hours. No results for input(s): AMMONIA in the last 168 hours. Coagulation Profile: No results for input(s): INR, PROTIME in the last 168 hours. Cardiac Enzymes: No results for input(s): CKTOTAL, CKMB, CKMBINDEX, TROPONINI in the last 168 hours. BNP (last 3 results) No results for input(s): PROBNP in the last 8760 hours. HbA1C: No results for input(s): HGBA1C in the last 72 hours. CBG: No results for input(s): GLUCAP in the last 168 hours. Lipid Profile: No results for input(s): CHOL, HDL, LDLCALC, TRIG, CHOLHDL, LDLDIRECT in the last 72 hours. Thyroid Function Tests: No results for input(s): TSH, T4TOTAL, FREET4, T3FREE, THYROIDAB in the last 72 hours. Anemia Panel: No results for input(s): VITAMINB12, FOLATE, FERRITIN, TIBC, IRON, RETICCTPCT in the last 72 hours. Urine analysis:    Component Value Date/Time   BILIRUBINUR neg 08/25/2014 1157   PROTEINUR neg 08/25/2014 1157   UROBILINOGEN 0.2 08/25/2014 1157   NITRITE neg 08/25/2014  [place] Negative 08/25/2014 1157   No results found for this or any previous visit (from the past 240 hour(s)).   Radiological Exams on Admission: Ct Cta Abd/pel W/cm &/or W/o Cm  06/05/2016  CLINICAL DATA:  55 year old female with central lower abdominal pain since earlier this morning and bloody stools. EXAM: CTA ABDOMEN AND PELVIS wITHOUT AND WITH CONTRAST TECHNIQUE: Multidetector CT imaging of the abdomen and pelvis was performed using the standard protocol during bolus administration of intravenous contrast. Multiplanar reconstructed images and MIPs were obtained and reviewed to evaluate the vascular anatomy. CONTRAST:  100 mL Isovue 370 COMPARISON:  CT abdomen/ pelvis 07/15/2015 FINDINGS: VASCULAR Aorta: Normal caliber abdominal aorta. No evidence of aneurysm, dissection or significant atherosclerotic plaque. Celiac: Widely patent and unremarkable. Incidentally, left hepatic artery is replaced to  the left gastric artery. SMA: The widely patent unremarkable. Renals: Single dominant renal arteries which are widely patent. No changes of fibromuscular dysplasia. Additionally, there is a small accessory renal artery to the right lower pole. IMA: Patent and unremarkable. Inflow: Patent and unremarkable. Proximal Outflow: Paid unremarkable. Veins: No focal venous abnormality. NON-VASCULAR Lower Chest: The lung bases are clear. Visualized cardiac structures are within normal limits for size. No pericardial effusion. Unremarkable visualized distal thoracic esophagus. Abdomen: Unremarkable CT appearance of the stomach, duodenum, spleen, left adrenal gland and pancreas. The right adrenal gland is surgically absent. The liver is diffusely low in attenuation consistent with at least at moderate hepatic steatosis. The gallbladder is surgically absent. No intra or extrahepatic biliary ductal dilatation. 2.3 cm circumscribed water attenuation cyst in the interpolar right kidney consistent with a simple cyst. There is a second smaller 1.1 cm cyst also in the interpolar right kidney. Similarly, there is a simple cyst in the lower pole of the left kidney measuring 1.7 cm. No hydronephrosis, nephrolithiasis or enhancing renal lesion. Extensive colonic diverticulosis predominantly involving the descending and sigmoid colon. High attenuation material is present within a diverticulum along the mesenteric border of the proximal descending colon in the region of the splenic flexure (image 54 series 4) high attenuation material can be seen within the adjacent colonic lumen as well. On the venous phase images this material has dispersed consistent with contrast extravasation. No findings to suggest acute diverticulitis. Diverticulosis also involves the cecum and ascending colon. Normal appendix in the right lower quadrant. No small bowel wall thickening or evidence of obstruction. No free fluid or suspicious adenopathy. Pelvis: Large  multi fibroid uterus with extensive dystrophic calcifications. The bladder is unremarkable. Unremarkable appearance of the adnexa. No free fluid or suspicious adenopathy. Bones/Soft Tissues: No acute osseous abnormality. Review of the MIP images confirms the above findings. IMPRESSION: VASCULAR 1. Active diverticular bleed in the proximal descending colon just beyond the splenic flexure. 2. No significant atherosclerotic vascular disease. NON VASCULAR 1. Extensive colonic diverticulosis. No evidence of active diverticulitis. 2. At least moderate hepatic steatosis. 3. Simple renal cysts noted incidentally. 4. Fibroid uterus. 5. 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 These results were called by telephone at the time of interpretation on 06/05/2016 at 2:19 pm to Dr. Jeannett Senior , who verbally acknowledged these results. Signed, Criselda Peaches, MD [facility] Electronically Signed   By: Jacqulynn Cadet M.D.   On: 06/05/2016 14:49    EKG: Independently reviewed. Tachycardic  Assessment/Plan Principal Problem:   Diverticulosis of colon with hemorrhage Active Problems:   Obesity   Hypertension   Type II  diabetes mellitus, uncontrolled ([place])   Hematochezia   Diverticulosis of the colon with active hemorrhage -Presented with rectal bleeding, stable hemodynamically, CT angiography shows active bleeding. -Interventional radiology consulted for further management. -No recent history of antiplatelets or anticoagulation use. -Hemoglobin stable at 14.0.  Type 2 diabetes mellitus -Is on insulin, metformin and Invokana. Hold oral hypoglycemic, start SSI. -Restarted insulin at half dose at 20 units at night, currently nothing by mouth she will be on clear liquids in a.m.  Hypertension -Restarted Coreg to avoid reflex tachycardia. -Held losartan and hydrochlorothiazide.  Obesity -Counseled   DVT  prophylaxis: SCDs, no chemical prophylaxis because of active bleeding Code Status: Full code Family Communication: Plan discussed with the patient Disposition Plan: Home Consults called: IR Admission status: Inpatient   [facility] A MD [facility] Pager [phone]  If 7PM-7AM, please contact night-coverage [email or web address] Password [facility]  06/05/2016, 3:17 PM

## 2016-06-05 NOTE — ED Notes (Signed)
Pt started having bloody stools this am has had 3 today, same thing happened last year, lower left quad pain a couple of days ago.

## 2016-06-05 NOTE — Progress Notes (Signed)
We were notified by the ER that patient is here with GI bleed.  CT angio shows active diverticular bleed from descending colon and patient already consulted by IR for angiography.  Spoke to Dr. Hartford Poli and told him to place a formal consult to GI if needed for ongoing issues post angiography (but this will hopefully resolve the issue).  I agree with the above.

## 2016-06-05 NOTE — ED Notes (Signed)
PA at bedside.

## 2016-06-05 NOTE — Procedures (Signed)
Interventional Radiology Procedure Note  Procedure: Selective and superselective mesenteric angiogram.  SMA, middle colic, IMA, left colic and distal left colic branches interrogated.  No active bleeding.   Arterial Access:  Right CFA 68F --> ExoSeal  Complications: None  Estimated Blood Loss: 0  Recommendations: - Admit for observation - Hydrate overnight - Clears only for now - Monitor H&H, transfuse if needed   Signed,  Criselda Peaches, MD

## 2016-06-05 NOTE — Consult Note (Signed)
Chief Complaint: lower GI bleed  Referring Physician: Dr. Verlee Monte  Supervising Physician: Jacqulynn Cadet  Patient Status: In-pt   HPI: Carol Mueller is an 55 y.o. female with a history of a GI bleed one year ago, DM, HTN, and Cushing's syndrome.  She had some "gurgling" in her stomach last night.  When she woke up this morning she began having diarrhea, but was mostly all bright red blood.  She had another episode at home and then decided she needed to come to the ED.  Since here she has had 5 other episodes.  She admits to minimal abdominal discomfort.  No nausea/vomiting, HA, CP, SOB.  She had a CTA of her abdomen/pelvis which revealed an active diverticular bleed.  We have been consulted for evaluation for embolization.  Past Medical History:  Past Medical History  Diagnosis Date  . Anemia   . Hypertension   . Polycystic ovary disease   . Cushing's syndrome ([place])     due to adrenal tumor  . Diabetes mellitus without complication (HCC)     insulin-dependent  . Hyperlipidemia     Past Surgical History:  Past Surgical History  Procedure Laterality Date  . Cholecystectomy    . Adrenalectomy  1996  . Esophagogastroduodenoscopy (egd) with propofol N/A 07/16/2015    Procedure: ESOPHAGOGASTRODUODENOSCOPY (EGD) WITH PROPOFOL;  Surgeon: [name], MD;  Location: [facility];  Service: Endoscopy;  Laterality: N/A;    Family History:  Family History  Problem Relation Age of Onset  . Hypertension Mother   . Cancer Mother     ?uterine or ovarian cancer  . Hypertension Father   . Diabetes Father   . Hypertension Sister   . Diabetes Sister   . Hypertension Brother   . Diabetes Brother   . Hypertension Maternal Grandfather   . Diabetes Paternal Grandmother   . Colon cancer Neg Hx   . Breast cancer Maternal Grandmother     Social History:  reports that she has never smoked. She has never used smokeless tobacco. She reports that she does not drink alcohol or  use illicit drugs.  Allergies:  Allergies  Allergen Reactions  . Norvasc [Amlodipine Besylate] Swelling    ANGIOEDEMA  . Nsaids Other (See Comments)    GI upset/ GI bleed    Medications:   Medication List    ASK your doctor about these medications        ADVANCED COLLAGEN PO  Take 1 scoop by mouth daily.     carvedilol 3.125 MG tablet  Commonly known as:  COREG  Take 3.125 mg by mouth 2 (two) times daily with a meal.     Insulin Glargine 100 UNIT/ML Solostar Pen  Commonly known as:  LANTUS SOLOSTAR  Inject 20 to 40 units into the skin HS. Increase dose by 2 units for readings above 140. PATIENT NEEDS OFFICE VISIT FOR ADDITIONAL REFILLS     Insulin Pen Needle 31G X 8 MM Misc  Commonly known as:  B-D ULTRAFINE III SHORT PEN  Use to inject insulin every night. PATIENT NEEDS OFFICE VISIT FOR ADDITIONAL REFILLS     INVOKANA 100 MG Tabs tablet  Generic drug:  canagliflozin  Take 100 mg by mouth daily.     ipratropium 0.03 % nasal spray  Commonly known as:  ATROVENT  Place 2 sprays into both nostrils 2 (two) times daily.     losartan-hydrochlorothiazide 100-25 MG tablet  Commonly known as:  HYZAAR  TAKE 1  TABLET BY MOUTH DAILY. NEEDS OFFICE VISIT     metFORMIN 1000 MG tablet  Commonly known as:  GLUCOPHAGE  Take 1 tablet (1,000 mg total) by mouth 2 (two) times daily with a meal. PATIENT NEEDS OFFICE VISIT FOR ADDITIONAL REFILLS     multivitamin with minerals tablet  Take 1 tablet by mouth daily.     Omega 3 1000 MG Caps  Take 1,000 mg by mouth daily.     pantoprazole 40 MG tablet  Commonly known as:  PROTONIX  TAKE 1 TABLET (40 MG TOTAL) BY MOUTH DAILY.     simethicone 80 MG chewable tablet  Commonly known as:  MYLICON  Chew 1 tablet (80 mg total) by mouth 4 (four) times daily as needed for flatulence (also available OTC).        Please HPI for pertinent positives, otherwise complete 10 system ROS negative.  Mallampati Score: MD Evaluation Airway:  WNL Heart: WNL Abdomen: WNL Chest/ Lungs: WNL ASA  Classification: 2 Mallampati/Airway Score: Two  Physical Exam: BP 132/82 mmHg  Pulse 91  Temp(Src) 98.8 F (37.1 C) (Oral)  Resp 22  SpO2 95% There is no weight on file to calculate BMI. General: pleasant, obese black female who is laying in bed in NAD HEENT: head is normocephalic, atraumatic.  Sclera are noninjected.  PERRL. Ptosis present of both eyes. Ears and nose without any masses or lesions.  Mouth is pink and moist Heart: regular, rate, and rhythm.  Normal s1,s2. No obvious murmurs, gallops, or rubs noted.  Palpable radial and pedal pulses bilaterally Lungs: CTAB, no wheezes, rhonchi, or rales noted.  Respiratory effort nonlabored Abd: soft, NT, ND, +BS, no masses, hernias, or organomegaly MS: all 4 extremities are symmetrical with no cyanosis, clubbing, or edema. Psych: A&Ox3 with an appropriate affect.   Labs: Results for orders placed or performed during the hospital encounter of 06/05/16 (from the past 48 hour(s))  Comprehensive metabolic panel     Status: Abnormal   Collection Time: 06/05/16 11:47 AM  Result Value Ref Range   Sodium 138 135 - 145 mmol/L   Potassium 3.7 3.5 - 5.1 mmol/L   Chloride 101 101 - 111 mmol/L   CO2 28 22 - 32 mmol/L   Glucose, Bld 131 (H) 65 - 99 mg/dL   BUN 23 (H) 6 - 20 mg/dL   Creatinine, Ser 0.84 0.44 - 1.00 mg/dL   Calcium 9.3 8.9 - 10.3 mg/dL   Total Protein 8.3 (H) 6.5 - 8.1 g/dL   Albumin 4.5 3.5 - 5.0 g/dL   AST 19 15 - 41 U/L   ALT 20 14 - 54 U/L   Alkaline Phosphatase 49 38 - 126 U/L   Total Bilirubin 0.6 0.3 - 1.2 mg/dL   GFR calc non Af Amer >60 >60 mL/min   GFR calc Af Amer >60 >60 mL/min    Comment: (NOTE) The eGFR has been calculated using the CKD EPI equation. This calculation has not been validated in all clinical situations. eGFR's persistently <60 mL/min signify possible Chronic Kidney Disease.    Anion gap 9 5 - 15  CBC     Status: Abnormal   Collection  Time: 06/05/16 11:47 AM  Result Value Ref Range   WBC 9.8 4.0 - 10.5 K/uL   RBC 6.45 (H) 3.87 - 5.11 MIL/uL   Hemoglobin 14.0 12.0 - 15.0 g/dL   HCT 45.2 36.0 - 46.0 %   MCV 70.1 (L) 78.0 - 100.0 fL   MCH 21.7 (  L) 26.0 - 34.0 pg   MCHC 31.0 30.0 - 36.0 g/dL   RDW 17.1 (H) 11.5 - 15.5 %   Platelets 344 150 - 400 K/uL  Type and screen [place]     Status: None   Collection Time: 06/05/16 11:47 AM  Result Value Ref Range   ABO/RH(D) O POS    Antibody Screen NEG    Sample Expiration 06/08/2016   I-Stat CG4 Lactic Acid, ED     Status: None   Collection Time: 06/05/16  1:25 PM  Result Value Ref Range   Lactic Acid, Venous 0.95 0.5 - 1.9 mmol/L  POC occult blood, ED     Status: Abnormal   Collection Time: 06/05/16  1:33 PM  Result Value Ref Range   Fecal Occult Bld POSITIVE (A) NEGATIVE    Imaging: Ct Cta Abd/pel W/cm &/or W/o Cm  06/05/2016  CLINICAL DATA:  55 year old female with central lower abdominal pain since earlier this morning and bloody stools. EXAM: CTA ABDOMEN AND PELVIS wITHOUT AND WITH CONTRAST TECHNIQUE: Multidetector CT imaging of the abdomen and pelvis was performed using the standard protocol during bolus administration of intravenous contrast. Multiplanar reconstructed images and MIPs were obtained and reviewed to evaluate the vascular anatomy. CONTRAST:  100 mL Isovue 370 COMPARISON:  CT abdomen/ pelvis 07/15/2015 FINDINGS: VASCULAR Aorta: Normal caliber abdominal aorta. No evidence of aneurysm, dissection or significant atherosclerotic plaque. Celiac: Widely patent and unremarkable. Incidentally, left hepatic artery is replaced to the left gastric artery. SMA: The widely patent unremarkable. Renals: Single dominant renal arteries which are widely patent. No changes of fibromuscular dysplasia. Additionally, there is a small accessory renal artery to the right lower pole. IMA: Patent and unremarkable. Inflow: Patent and unremarkable. Proximal Outflow:  Paid unremarkable. Veins: No focal venous abnormality. NON-VASCULAR Lower Chest: The lung bases are clear. Visualized cardiac structures are within normal limits for size. No pericardial effusion. Unremarkable visualized distal thoracic esophagus. Abdomen: Unremarkable CT appearance of the stomach, duodenum, spleen, left adrenal gland and pancreas. The right adrenal gland is surgically absent. The liver is diffusely low in attenuation consistent with at least at moderate hepatic steatosis. The gallbladder is surgically absent. No intra or extrahepatic biliary ductal dilatation. 2.3 cm circumscribed water attenuation cyst in the interpolar right kidney consistent with a simple cyst. There is a second smaller 1.1 cm cyst also in the interpolar right kidney. Similarly, there is a simple cyst in the lower pole of the left kidney measuring 1.7 cm. No hydronephrosis, nephrolithiasis or enhancing renal lesion. Extensive colonic diverticulosis predominantly involving the descending and sigmoid colon. High attenuation material is present within a diverticulum along the mesenteric border of the proximal descending colon in the region of the splenic flexure (image 54 series 4) high attenuation material can be seen within the adjacent colonic lumen as well. On the venous phase images this material has dispersed consistent with contrast extravasation. No findings to suggest acute diverticulitis. Diverticulosis also involves the cecum and ascending colon. Normal appendix in the right lower quadrant. No small bowel wall thickening or evidence of obstruction. No free fluid or suspicious adenopathy. Pelvis: Large multi fibroid uterus with extensive dystrophic calcifications. The bladder is unremarkable. Unremarkable appearance of the adnexa. No free fluid or suspicious adenopathy. Bones/Soft Tissues: No acute osseous abnormality. Review of the MIP images confirms the above findings. IMPRESSION: VASCULAR 1. Active diverticular bleed  in the proximal descending colon just beyond the splenic flexure. 2. No significant atherosclerotic vascular disease. NON  VASCULAR 1. Extensive colonic diverticulosis. No evidence of active diverticulitis. 2. At least moderate hepatic steatosis. 3. Simple renal cysts noted incidentally. 4. Fibroid uterus. 5. 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 These results were called by telephone at the time of interpretation on 06/05/2016 at 2:19 pm to Dr. Jeannett Senior , who verbally acknowledged these results. Signed, Criselda Peaches, MD [facility] Electronically Signed   By: Jacqulynn Cadet M.D.   On: 06/05/2016 14:49    Assessment/Plan 1. LGI bleed, secondary to a diverticular bleed -the patient has active bleeding on her CTA.  We will pursue visceral angiogram with possible embolization if bleeding is still present at the time of our procedure. -her labs and vitals have been reviewed.  Her Creatinine is normal. -she is currently NPO -Risks and Benefits discussed with the patient including, but not limited to bleeding, infection, vascular injury or contrast induced renal failure. All of the patient's questions were answered, patient is agreeable to proceed. Consent signed and in chart.  Thank you for this interesting consult.  I greatly enjoyed meeting Carol Mueller and look forward to participating in their care.  A copy of this report was sent to the requesting provider on this date.  Electronically Signed: Henreitta Cea 06/05/2016, 3:01 PM   I spent a total of 40 Minutes   in face to face in clinical consultation, greater than 50% of which was counseling/coordinating care for lower GI bleed

## 2016-06-06 DIAGNOSIS — K922 Gastrointestinal hemorrhage, unspecified: Secondary | ICD-10-CM

## 2016-06-06 DIAGNOSIS — I10 Essential (primary) hypertension: Secondary | ICD-10-CM

## 2016-06-06 LAB — CBC
HCT: 40.3 % (ref 36.0–46.0)
Hemoglobin: 12.7 g/dL (ref 12.0–15.0)
MCH: 21.6 pg — ABNORMAL LOW (ref 26.0–34.0)
MCHC: 31.5 g/dL (ref 30.0–36.0)
MCV: 68.7 fL — ABNORMAL LOW (ref 78.0–100.0)
Platelets: 298 10*3/uL (ref 150–400)
RBC: 5.87 MIL/uL — ABNORMAL HIGH (ref 3.87–5.11)
RDW: 17.4 % — ABNORMAL HIGH (ref 11.5–15.5)
WBC: 9.8 10*3/uL (ref 4.0–10.5)

## 2016-06-06 LAB — BASIC METABOLIC PANEL
Anion gap: 6 (ref 5–15)
BUN: 15 mg/dL (ref 6–20)
CO2: 30 mmol/L (ref 22–32)
Calcium: 8.9 mg/dL (ref 8.9–10.3)
Chloride: 101 mmol/L (ref 101–111)
Creatinine, Ser: 0.79 mg/dL (ref 0.44–1.00)
GFR calc Af Amer: >60 mL/min (ref >60)
GFR calc non Af Amer: >60 mL/min (ref >60)
Glucose, Bld: 108 mg/dL — ABNORMAL HIGH (ref 65–99)
Potassium: 3.6 mmol/L (ref 3.5–5.1)
Sodium: 137 mmol/L (ref 135–145)

## 2016-06-06 LAB — GLUCOSE, CAPILLARY
GLUCOSE-CAPILLARY: 104 mg/dL — AB (ref 65–99)
GLUCOSE-CAPILLARY: 134 mg/dL — AB (ref 65–99)
GLUCOSE-CAPILLARY: 136 mg/dL — AB (ref 65–99)
Glucose-Capillary: 218 mg/dL — ABNORMAL HIGH (ref 65–99)
Glucose-Capillary: 146 mg/dL — ABNORMAL HIGH (ref 65–99)

## 2016-06-06 MED ORDER — HYDROCHLOROTHIAZIDE 25 MG PO TABS
25.0000 mg | ORAL_TABLET | Freq: Every day | ORAL | Status: DC
  Administered 2016-06-07 – 2016-06-09 (×3): 25 mg via ORAL
  Filled 2016-06-06 (×3): qty 1

## 2016-06-06 MED ORDER — METFORMIN HCL 500 MG PO TABS
1000.0000 mg | ORAL_TABLET | Freq: Two times a day (BID) | ORAL | Status: DC
  Administered 2016-06-08 – 2016-06-09 (×3): 1000 mg via ORAL
  Filled 2016-06-06 (×4): qty 2

## 2016-06-06 MED ORDER — SIMETHICONE 80 MG PO CHEW
80.0000 mg | CHEWABLE_TABLET | Freq: Four times a day (QID) | ORAL | Status: DC | PRN
  Administered 2016-06-06 – 2016-06-08 (×2): 80 mg via ORAL
  Filled 2016-06-06 (×3): qty 1

## 2016-06-06 MED ORDER — LOSARTAN POTASSIUM 50 MG PO TABS
100.0000 mg | ORAL_TABLET | Freq: Every day | ORAL | Status: DC
  Administered 2016-06-07 – 2016-06-09 (×3): 100 mg via ORAL
  Filled 2016-06-06 (×3): qty 2

## 2016-06-06 MED ORDER — LOSARTAN POTASSIUM-HCTZ 100-25 MG PO TABS: 1.0000 | ORAL_TABLET | Freq: Every day | ORAL | Status: DC

## 2016-06-06 NOTE — Progress Notes (Signed)
Patient ID: Carol Mueller, female   DOB: 17-Jun-1961, 55 y.o.   MRN: [id number]  PROGRESS NOTE    Carol Mueller  [id number] DOB: [birth year] DOA: 06/05/2016  PCP: Delman Cheadle, MD   Brief Narrative:  55 y.o. female with past medical history significant for diabetes mellitus, HTN and polycystic ovary disease who presented to [facility] with rectal bleed with and without bowel movement. She underwent  CT angiography which showed active bleeding, IR consulted for further management.   Assessment & Plan:   Principal Problem:   Diverticulosis of colon with hemorrhage / Lower GI bleed  - Lower GI bleed secondary to a diverticular bleed - Noted active bleeding on CTA - Underwent visceral angiogram but no active bleed detected - Monitor CBC in next 24 hours - If no further bleed then ay go home in am    Essential hypertension - Continue carvedilol   DVT prophylaxis: SCD's bilaterally  Code Status: full code  Family Communication: no family at the bedside this am Disposition Plan: home in am if hgb stable and no bleeding    Consultants:   IR  Procedures:  Angiogram 7/21  Antimicrobials:  None    Subjective: No further bleed this am.   Objective: Filed Vitals:   06/05/16 1800 06/05/16 2034 06/06/16 0535 06/06/16 0825  BP: 113/69 118/72 114/64 111/70  Pulse: 90 86 79   Temp: 99.3 F (37.4 C) 99.1 F (37.3 C) 98.4 F (36.9 C)   TempSrc: Oral Oral Oral   Resp:   20   Height:      Weight:      SpO2: 97% 94% 95%     Intake/Output Summary (Last 24 hours) at 06/06/16 1122 Last data filed at 06/06/16 0830  Gross per 24 hour  Intake    120 ml  Output      0 ml  Net    120 ml   Filed Weights   06/05/16 1700  Weight: 112.6 kg (248 lb 3.8 oz)    Examination:  General exam: Appears calm and comfortable  Respiratory system: Clear to auscultation. Respiratory effort normal. Cardiovascular system: S1 & S2 heard, RRR. No JVD, murmurs, rubs, gallops or clicks. No pedal  edema. Gastrointestinal system: Abdomen is nondistended, soft and nontender. No organomegaly or masses felt. Normal bowel sounds heard. Central nervous system: Alert and oriented. No focal neurological deficits. Extremities: Symmetric 5 x 5 power. Skin: No rashes, lesions or ulcers Psychiatry: Judgement and insight appear normal. Mood & affect appropriate.   Data Reviewed: I have personally reviewed following labs and imaging studies  CBC:  Recent Labs Lab 06/05/16 1147 06/06/16 0550  WBC 9.8 9.8  HGB 14.0 12.7  HCT 45.2 40.3  MCV 70.1* 68.7*  PLT 344 [id number]   Basic Metabolic Panel:  Recent Labs Lab 06/05/16 1147 06/06/16 0550  NA 138 137  K 3.7 3.6  CL 101 101  CO2 28 30  GLUCOSE 131* 108*  BUN 23* 15  CREATININE 0.84 0.79  CALCIUM 9.3 8.9   GFR: Estimated Creatinine Clearance: 96.6 mL/min (by C-G formula based on Cr of 0.79). Liver Function Tests:  Recent Labs Lab 06/05/16 1147  AST 19  ALT 20  ALKPHOS 49  BILITOT 0.6  PROT 8.3*  ALBUMIN 4.5   No results for input(s): LIPASE, AMYLASE in the last 168 hours. No results for input(s): AMMONIA in the last 168 hours. Coagulation Profile:  Recent Labs Lab 06/05/16 1727  INR 1.01   Cardiac Enzymes: No  results for input(s): CKTOTAL, CKMB, CKMBINDEX, TROPONINI in the last 168 hours. BNP (last 3 results) No results for input(s): PROBNP in the last 8760 hours. HbA1C: No results for input(s): HGBA1C in the last 72 hours. CBG:  Recent Labs Lab 06/05/16 1749 06/05/16 2143 06/06/16 0719 06/06/16 1111  GLUCAP 82 133* 104* 218*   Lipid Profile: No results for input(s): CHOL, HDL, LDLCALC, TRIG, CHOLHDL, LDLDIRECT in the last 72 hours. Thyroid Function Tests:  Recent Labs  06/05/16 1727  TSH 0.628   Anemia Panel: No results for input(s): VITAMINB12, FOLATE, FERRITIN, TIBC, IRON, RETICCTPCT in the last 72 hours. Urine analysis:    Component Value Date/Time   BILIRUBINUR neg 08/25/2014 1157    PROTEINUR neg 08/25/2014 1157   UROBILINOGEN 0.2 08/25/2014 1157   NITRITE neg 08/25/2014 1157   LEUKOCYTESUR Negative 08/25/2014 1157   Sepsis Labs: @LABRCNTIP (procalcitonin:4,lacticidven:4)   )No results found for this or any previous visit (from the past 240 hour(s)).    Radiology Studies: Ir Angiogram Visceral Selective 06/05/2016  No evidence of active bleeding or vascular abnormality at this time. Diverticular bleed seen on the recent CT arteriogram is no longer bleeding.   Ir Angiogram Visceral Selective 06/05/2016  No evidence of active bleeding or vascular abnormality at this time. Diverticular bleed seen on the recent CT arteriogram is no longer bleeding.   Ir Angiogram Selective Each Additional Vessel 06/05/2016  No evidence of active bleeding or vascular abnormality at this time. Diverticular bleed seen on the recent CT arteriogram is no longer bleeding.   Ir Angiogram Selective Each Additional Vessel 06/05/2016   No evidence of active bleeding or vascular abnormality at this time. Diverticular bleed seen on the recent CT arteriogram is no longer bleeding.   Ir Angiogram Selective Each Additional Vessel 06/05/2016 No evidence of active bleeding or vascular abnormality at this time. Diverticular bleed seen on the recent CT arteriogram is no longer bleeding.   Ir Angiogram Selective Each Additional Vessel 06/05/2016  No evidence of active bleeding or vascular abnormality at this time. Diverticular bleed seen on the recent CT arteriogram is no longer bleeding.   Ir US Guide Vasc Access Right 06/05/2016 No evidence of active bleeding or vascular abnormality at this time. Diverticular bleed seen on the recent CT arteriogram is no longer bleeding.   Ct Cta Abd/pel W/cm &/or W/o Cm 06/05/2016   VASCULAR 1. Active diverticular bleed in the proximal descending colon just beyond the splenic flexure. 2. No significant atherosclerotic vascular disease. NON VASCULAR 1. Extensive colonic  diverticulosis. No evidence of active diverticulitis. 2. At least moderate hepatic steatosis. 3. Simple renal cysts noted incidentally. 4. Fibroid uterus. 5. 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    Scheduled Meds: . carvedilol  3.125 mg Oral BID WC  . insulin aspart  0-9 Units Subcutaneous TID WC  . insulin glargine  20 Units Subcutaneous Q2200  . multivitamin with minerals  1 tablet Oral Daily   Continuous Infusions: . sodium chloride 100 mL/hr at 06/06/16 1033     LOS: 1 day    Time spent: 15 minutes  Greater than 50% of the time spent on counseling and coordinating the care.   Leisa Lenz, MD [facility] Pager [phone]  If 7PM-7AM, please contact night-coverage [email or web address] Password [facility] 06/06/2016, 11:22 AM

## 2016-06-07 DIAGNOSIS — K5731 Diverticulosis of large intestine without perforation or abscess with bleeding: Principal | ICD-10-CM

## 2016-06-07 DIAGNOSIS — D62 Acute posthemorrhagic anemia: Secondary | ICD-10-CM

## 2016-06-07 LAB — GLUCOSE, CAPILLARY
GLUCOSE-CAPILLARY: 140 mg/dL — AB (ref 65–99)
Glucose-Capillary: 158 mg/dL — ABNORMAL HIGH (ref 65–99)
Glucose-Capillary: 167 mg/dL — ABNORMAL HIGH (ref 65–99)
Glucose-Capillary: 188 mg/dL — ABNORMAL HIGH (ref 65–99)

## 2016-06-07 LAB — BASIC METABOLIC PANEL
Anion gap: 5 (ref 5–15)
BUN: 14 mg/dL (ref 6–20)
CHLORIDE: 107 mmol/L (ref 101–111)
CO2: 27 mmol/L (ref 22–32)
CREATININE: 0.71 mg/dL (ref 0.44–1.00)
Calcium: 8.4 mg/dL — ABNORMAL LOW (ref 8.9–10.3)
GFR calc Af Amer: >60 mL/min (ref >60)
GFR calc non Af Amer: >60 mL/min (ref >60)
Glucose, Bld: 136 mg/dL — ABNORMAL HIGH (ref 65–99)
POTASSIUM: 3.7 mmol/L (ref 3.5–5.1)
Sodium: 139 mmol/L (ref 135–145)

## 2016-06-07 LAB — CBC
HEMATOCRIT: 36 % (ref 36.0–46.0)
HEMOGLOBIN: 10.8 g/dL — AB (ref 12.0–15.0)
MCH: 21.3 pg — ABNORMAL LOW (ref 26.0–34.0)
MCHC: 30 g/dL (ref 30.0–36.0)
MCV: 70.9 fL — AB (ref 78.0–100.0)
PLATELETS: 268 10*3/uL (ref 150–400)
RBC: 5.08 MIL/uL (ref 3.87–5.11)
RDW: 17.2 % — ABNORMAL HIGH (ref 11.5–15.5)
WBC: 8.5 10*3/uL (ref 4.0–10.5)

## 2016-06-07 MED ORDER — FAMOTIDINE IN NACL 20-0.9 MG/50ML-% IV SOLN
20.0000 mg | Freq: Two times a day (BID) | INTRAVENOUS | Status: DC
  Administered 2016-06-07 – 2016-06-08 (×3): 20 mg via INTRAVENOUS
  Filled 2016-06-07 (×4): qty 50

## 2016-06-07 NOTE — Progress Notes (Signed)
Patient ID: Carol Mueller, female   DOB: 1961/06/25, 55 y.o.   MRN: [id number]  PROGRESS NOTE    Carol Mueller  [id number] DOB: 1960/12/11 DOA: 06/05/2016  PCP: Delman Cheadle, MD   Brief Narrative:  55 y.o. female with past medical history significant for diabetes mellitus, HTN and polycystic ovary disease who presented to [facility] with rectal bleed with and without bowel movement. She underwent  CT angiography which showed active bleeding, IR consulted for further management.   Assessment & Plan:   Principal Problem:   Diverticulosis of colon with hemorrhage / Lower GI bleed / Acute blood loss anemia  - Lower GI bleed secondary to a diverticular bleed - Noted active bleeding on CTA but when underwent visceral angiogram no active bleed detected - She had 2 episodes of blood with BM yesterday 7/22 and hemoglobin dropped from 12.7 to 10.8 - Order placed for pepcid IV 20 mg Q 12 hours  - Appreciate GI input     Essential hypertension - Continue carvedilol and losartan  - Continue hctz     Diabetes mellitus without complications with long term insulin use - Continue Lantus 20 units at bedtime and SSI  DVT prophylaxis: SCD's bilaterally  Code Status: full code  Family Communication: no family at the bedside this am Disposition Plan: pt bled yesterday 2 times with BM, considering drop in hgb will call gi for input   Consultants:   IR  Procedures:  Angiogram 7/21  Antimicrobials:  None    Subjective: Had blood in BM yesterday 2 times.   Objective: Vitals:   06/06/16 2211 06/07/16 0540 06/07/16 0842 06/07/16 1008  BP: 120/65 127/74 132/83 126/77  Pulse: 79 86 94 91  Resp: 18 17 20    Temp: 98.4 F (36.9 C) 98.1 F (36.7 C)    TempSrc: Oral Oral    SpO2: 97% 100% 100% 96%  Weight:      Height:        Intake/Output Summary (Last 24 hours) at 06/07/16 1014 Last data filed at 06/07/16 0540  Gross per 24 hour  Intake              600 ml  Output                0 ml    Net              600 ml   Filed Weights   06/05/16 1700  Weight: 112.6 kg (248 lb 3.8 oz)    Examination:  General exam: no distress Respiratory system: Respiratory effort normal. Cardiovascular system: S1 & S2 heard, Rate controlled  Gastrointestinal system: (+) BS, non tender  Central nervous system: No focal neurological deficits. Extremities: no edema, palpable pulses  Skin: warm and dry  Psychiatry: Mood & affect appropriate.   Data Reviewed: I have personally reviewed following labs and imaging studies  CBC:  Recent Labs Lab 06/05/16 1147 06/06/16 0550 06/07/16 0531  WBC 9.8 9.8 8.5  HGB 14.0 12.7 10.8*  HCT 45.2 40.3 36.0  MCV 70.1* 68.7* 70.9*  PLT 344 298 [id number]   Basic Metabolic Panel:  Recent Labs Lab 06/05/16 1147 06/06/16 0550 06/07/16 0531  NA 138 137 139  K 3.7 3.6 3.7  CL 101 101 107  CO2 28 30 27   GLUCOSE 131* 108* 136*  BUN 23* 15 14  CREATININE 0.84 0.79 0.71  CALCIUM 9.3 8.9 8.4*   GFR: Estimated Creatinine Clearance: 96.6 mL/min (by C-G formula based on SCr  of 0.8 mg/dL). Liver Function Tests:  Recent Labs Lab 06/05/16 1147  AST 19  ALT 20  ALKPHOS 49  BILITOT 0.6  PROT 8.3*  ALBUMIN 4.5   No results for input(s): LIPASE, AMYLASE in the last 168 hours. No results for input(s): AMMONIA in the last 168 hours. Coagulation Profile:  Recent Labs Lab 06/05/16 1727  INR 1.01   Cardiac Enzymes: No results for input(s): CKTOTAL, CKMB, CKMBINDEX, TROPONINI in the last 168 hours. BNP (last 3 results) No results for input(s): PROBNP in the last 8760 hours. HbA1C: No results for input(s): HGBA1C in the last 72 hours. CBG:  Recent Labs Lab 06/06/16 1111 06/06/16 1302 06/06/16 1639 06/06/16 2208 06/07/16 0714  GLUCAP 218* 134* 136* 146* 158*   Lipid Profile: No results for input(s): CHOL, HDL, LDLCALC, TRIG, CHOLHDL, LDLDIRECT in the last 72 hours. Thyroid Function Tests:  Recent Labs  06/05/16 1727  TSH 0.628    Anemia Panel: No results for input(s): VITAMINB12, FOLATE, FERRITIN, TIBC, IRON, RETICCTPCT in the last 72 hours. Urine analysis:    Component Value Date/Time   BILIRUBINUR neg 08/25/2014 1157   PROTEINUR neg 08/25/2014 1157   UROBILINOGEN 0.2 08/25/2014 1157   NITRITE neg 08/25/2014 1157   LEUKOCYTESUR Negative 08/25/2014 1157   Sepsis Labs: @LABRCNTIP (procalcitonin:4,lacticidven:4)   )No results found for this or any previous visit (from the past 240 hour(s)).    Radiology Studies: Ir Angiogram Visceral Selective 06/05/2016  No evidence of active bleeding or vascular abnormality at this time. Diverticular bleed seen on the recent CT arteriogram is no longer bleeding.   Ir Angiogram Visceral Selective 06/05/2016  No evidence of active bleeding or vascular abnormality at this time. Diverticular bleed seen on the recent CT arteriogram is no longer bleeding.   Ir Angiogram Selective Each Additional Vessel 06/05/2016  No evidence of active bleeding or vascular abnormality at this time. Diverticular bleed seen on the recent CT arteriogram is no longer bleeding.   Ir Angiogram Selective Each Additional Vessel 06/05/2016   No evidence of active bleeding or vascular abnormality at this time. Diverticular bleed seen on the recent CT arteriogram is no longer bleeding.   Ir Angiogram Selective Each Additional Vessel 06/05/2016 No evidence of active bleeding or vascular abnormality at this time. Diverticular bleed seen on the recent CT arteriogram is no longer bleeding.   Ir Angiogram Selective Each Additional Vessel 06/05/2016  No evidence of active bleeding or vascular abnormality at this time. Diverticular bleed seen on the recent CT arteriogram is no longer bleeding.   Ir US Guide Vasc Access Right 06/05/2016 No evidence of active bleeding or vascular abnormality at this time. Diverticular bleed seen on the recent CT arteriogram is no longer bleeding.   Ct Cta Abd/pel W/cm &/or W/o  Cm 06/05/2016   VASCULAR 1. Active diverticular bleed in the proximal descending colon just beyond the splenic flexure. 2. No significant atherosclerotic vascular disease. NON VASCULAR 1. Extensive colonic diverticulosis. No evidence of active diverticulitis. 2. At least moderate hepatic steatosis. 3. Simple renal cysts noted incidentally. 4. Fibroid uterus. 5. 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    Scheduled Meds: . carvedilol  3.125 mg Oral BID WC  . losartan  100 mg Oral Daily   And  . hydrochlorothiazide  25 mg Oral Daily  . insulin aspart  0-9 Units Subcutaneous TID WC  . insulin glargine  20 Units Subcutaneous Q2200  . metFORMIN  1,000 mg Oral BID WC  . multivitamin with  minerals  1 tablet Oral Daily   Continuous Infusions: . sodium chloride 100 mL/hr at 06/07/16 0908     LOS: 2 days    Time spent: 15 minutes  Greater than 50% of the time spent on counseling and coordinating the care.   Leisa Lenz, MD [facility] Pager [phone]  If 7PM-7AM, please contact night-coverage [email or web address] Password [email or web address] 06/07/2016, 10:14 AM

## 2016-06-07 NOTE — Consult Note (Signed)
Consultation  Referring Provider:  Dr. Charlies Silvers    Primary Care Physician:  Delman Cheadle, MD Primary Gastroenterologist:  Dr. [name]        Reason for Consultation: Diverticular Bleed             HPI:   Carol Mueller is a 55 y.o. female with a past medical history significant for GI bleed 1 year ago thought to be related to mesenteric ischemia, diabetes, hypertension and Cushing syndrome who presented to the hospital on 06/05/16 for a complaint of rectal bleeding with and without a bowel movement.  Today, the patient explains that this past Friday, [birth year] was her birthday and she was planning on going to the spa , but awoke and had 2 watery bowel movements with bright red blood, she then had another one within the next couple of hours and proceeded to the ER. She tells me that before this, she was having looser stools than normal for the whole week before. She denies any abrupt changes in her diet or medications. She denies any preceding abdominal pain.  Since time of admission patient reports one maroon stool yesterday morning and a lot of abdominal bloating and "rumbling", she then had another bright red blood stool around 4 PM and at 7 PM. Her last one was this morning around 9:30 AM, though she tells me this was "more maroon in color". She is also experiencing a lot of bloating. Associated symptoms include increase in headaches over the past few weeks.  Patient tells me that she does have diarrhea off and on and has done for a long time ever since starting metformin and was told that this could be a side effect.   Patient denies syncope, shortness of breath, fever, chills, heartburn, reflux, melena or abdominal pain.  Hospital course: Patient did have CT angiography which showed active bleeding and IR was counseled for initial further management, they were unable to detect a bleeding vessel during angiography.  Previous GI history: 08/07/15-office visit, Alonza Bogus, PA-C- doing well  after recent admission for mesenteric ischemia 07/16/15-EGD, Dr. Lisbeth Renshaw esophagus and GE junction stomach is well-visualized and normal in appearance, normal appearing duodenum 01/09/13-colonoscopy, Dr. Mariana Single diverticulosis in the descending and sigmoid colon and otherwise normal colonoscopy  Past Medical History:  Diagnosis Date  . Anemia   . Cushing's syndrome ([place])    due to adrenal tumor  . Diabetes mellitus without complication (HCC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  . Hyperlipidemia   . Hypertension   . Polycystic ovary disease     Past Surgical History:  Procedure Laterality Date  . ADRENALECTOMY  1996  . CHOLECYSTECTOMY    . ESOPHAGOGASTRODUODENOSCOPY (EGD) WITH PROPOFOL N/A 07/16/2015   Procedure: ESOPHAGOGASTRODUODENOSCOPY (EGD) WITH PROPOFOL;  Surgeon: [name], MD;  Location: [facility];  Service: Endoscopy;  Laterality: N/A;    Family History  Problem Relation Age of Onset  . Hypertension Mother   . Cancer Mother     ?uterine or ovarian cancer  . Hypertension Father   . Diabetes Father   . Hypertension Sister   . Diabetes Sister   . Hypertension Brother   . Diabetes Brother   . Hypertension Maternal Grandfather   . Diabetes Paternal Grandmother   . Colon cancer Neg Hx   . Breast cancer Maternal Grandmother     Social History  Substance Use Topics  . Smoking status: Never Smoker  . Smokeless tobacco: Never Used  . Alcohol use No  Prior to Admission medications   Medication Sig Start Date End Date Taking? Authorizing Provider  Canagliflozin (INVOKANA) 100 MG TABS Take 100 mg by mouth daily.    Yes Historical Provider, MD  carvedilol (COREG) 3.125 MG tablet Take 3.125 mg by mouth 2 (two) times daily with a meal.   Yes Historical Provider, MD  Insulin Glargine (LANTUS SOLOSTAR) 100 UNIT/ML SOPN Inject 20 to 40 units into the skin HS. Increase dose by 2 units for readings above 140. PATIENT NEEDS OFFICE VISIT FOR ADDITIONAL REFILLS 09/19/13   Yes Eleanore Kurtis Bushman, PA-C  Insulin Pen Needle (B-D ULTRAFINE III SHORT PEN) 31G X 8 MM MISC Use to inject insulin every night. PATIENT NEEDS OFFICE VISIT FOR ADDITIONAL REFILLS 12/11/13  Yes Mancel Bale, PA-C  losartan-hydrochlorothiazide (HYZAAR) 100-25 MG per tablet TAKE 1 TABLET BY MOUTH DAILY. NEEDS OFFICE VISIT 03/20/13  Yes Shawnee Knapp, MD  metFORMIN (GLUCOPHAGE) 1000 MG tablet Take 1 tablet (1,000 mg total) by mouth 2 (two) times daily with a meal. PATIENT NEEDS OFFICE VISIT FOR ADDITIONAL REFILLS 09/07/13  Yes Theda Sers, PA-C  Multiple Vitamins-Minerals (MULTIVITAMIN WITH MINERALS) tablet Take 1 tablet by mouth daily.   Yes Historical Provider, MD  Omega 3 1000 MG CAPS Take 1,000 mg by mouth daily.   Yes Historical Provider, MD  Specialty Vitamins Products (ADVANCED COLLAGEN PO) Take 1 scoop by mouth daily.   Yes Historical Provider, MD  ipratropium (ATROVENT) 0.03 % nasal spray Place 2 sprays into both nostrils 2 (two) times daily. Patient not taking: Reported on 06/05/2016 11/13/15   Harrison Mons, PA-C  pantoprazole (PROTONIX) 40 MG tablet TAKE 1 TABLET (40 MG TOTAL) BY MOUTH DAILY. Patient not taking: Reported on 11/05/2015 09/11/14   Mancel Bale, PA-C  simethicone (MYLICON) 80 MG chewable tablet Chew 1 tablet (80 mg total) by mouth 4 (four) times daily as needed for flatulence (also available OTC). Patient not taking: Reported on 11/05/2015 07/17/15   Ripudeep Krystal Eaton, MD    Current Facility-Administered Medications  Medication Dose Route Frequency Provider Last Rate Last Dose  . 0.9 %  sodium chloride infusion   Intravenous Continuous Verlee Monte, MD 100 mL/hr at 06/07/16 0908    . acetaminophen (TYLENOL) tablet 650 mg  650 mg Oral Q6H PRN Verlee Monte, MD       Or  . acetaminophen (TYLENOL) suppository 650 mg  650 mg Rectal Q6H PRN Verlee Monte, MD      . carvedilol (COREG) tablet 3.125 mg  3.125 mg Oral BID WC Verlee Monte, MD   3.125 mg at 06/07/16 0845  . famotidine  (PEPCID) IVPB 20 mg premix  20 mg Intravenous Q12H Robbie Lis, MD      . losartan (COZAAR) tablet 100 mg  100 mg Oral Daily Robbie Lis, MD   100 mg at 06/07/16 1010   And  . hydrochlorothiazide (HYDRODIURIL) tablet 25 mg  25 mg Oral Daily Robbie Lis, MD   25 mg at 06/07/16 1010  . insulin aspart (novoLOG) injection 0-9 Units  0-9 Units Subcutaneous TID WC Verlee Monte, MD   2 Units at 06/07/16 0846  . insulin glargine (LANTUS) injection 20 Units  20 Units Subcutaneous Q2200 Verlee Monte, MD   20 Units at 06/06/16 2200  . metFORMIN (GLUCOPHAGE) tablet 1,000 mg  1,000 mg Oral BID WC Robbie Lis, MD      . multivitamin with minerals tablet 1 tablet  1 tablet Oral Daily Mutaz  Elmahi, MD   1 tablet at 06/06/16 1000  . ondansetron (ZOFRAN) tablet 4 mg  4 mg Oral Q6H PRN Verlee Monte, MD       Or  . ondansetron (ZOFRAN) injection 4 mg  4 mg Intravenous Q6H PRN Verlee Monte, MD      . simethicone (MYLICON) chewable tablet 80 mg  80 mg Oral QID PRN Robbie Lis, MD   80 mg at 06/06/16 1713    Allergies as of 06/05/2016 - Review Complete 06/05/2016  Allergen Reaction Noted  . Norvasc [amlodipine besylate] Swelling 02/22/2012  . Nsaids Other (See Comments) 08/07/2015     Review of Systems:    Constitutional: Positive for fatigue No weight loss, fever, chills or weakness HEENT: Eyes: No change in vision               Ears, Nose, Throat:  Positive for URI symptoms month ago, with lingering laryngitis No change in hearing Skin: No rash or itching Cardiovascular: No chest pain, chest pressure or palpitations  Respiratory: No SOB or cough Gastrointestinal: See HPI and otherwise negative Genitourinary: No dysuria or change in urinary frequency Neurological: Positive for headache No dizziness or syncope Musculoskeletal: No muscle or back pain Hematologic: Positive for hematochezia as in history of present illness No bruising Psychiatric: No history of depression or anxiety   Physical  Exam:  Vital signs in last 24 hours: Temp:  [98.1 F (36.7 C)-98.4 F (36.9 C)] 98.1 F (36.7 C) (07/23 0540) Pulse Rate:  [79-94] 91 (07/23 1008) Resp:  [17-20] 20 (07/23 0842) BP: (120-132)/(65-83) 126/77 (07/23 1008) SpO2:  [96 %-100 %] 96 % (07/23 1008) Last BM Date: 06/06/16 General:   Pleasant African-American appears to be in NAD, Well developed, Well nourished, alert and cooperative Head:  Normocephalic and atraumatic. Eyes:   PEERL, EOMI. No icterus. Conjunctiva pink. Ears:  Normal auditory acuity. Neck:  Supple Throat: Oral cavity and pharynx without inflammation, swelling or lesion. Teeth in good condition. Lungs: Respirations even and unlabored. Lungs clear to auscultation bilaterally.   No wheezes, crackles, or rhonchi.  Heart: Normal S1, S2. No MRG. Regular rate and rhythm. No peripheral edema, cyanosis or pallor.  Abdomen:  Soft, nondistended, nontender. No rebound or guarding. Normal bowel sounds. No appreciable masses or hepatomegaly. Rectal:  Not performed.  Msk:  Symmetrical without gross deformities. Peripheral pulses intact.  Extremities:  Without edema, no deformity or joint abnormality. Normal ROM Neurologic:  Alert and  oriented x4;  grossly normal neurologically. CN II-XII intact.  Skin:   Dry and intact without significant lesions or rashes. Psychiatric: Oriented to person, place and time. Demonstrates good judgement and reason without abnormal affect or behaviors.   LAB RESULTS:  Recent Labs  06/05/16 1147 06/06/16 0550 06/07/16 0531  WBC 9.8 9.8 8.5  HGB 14.0 12.7 10.8*  HCT 45.2 40.3 36.0  PLT 344 298 268   BMET  Recent Labs  06/05/16 1147 06/06/16 0550 06/07/16 0531  NA 138 137 139  K 3.7 3.6 3.7  CL 101 101 107  CO2 28 30 27   GLUCOSE 131* 108* 136*  BUN 23* 15 14  CREATININE 0.84 0.79 0.71  CALCIUM 9.3 8.9 8.4*   LFT  Recent Labs  06/05/16 1147  PROT 8.3*  ALBUMIN 4.5  AST 19  ALT 20  ALKPHOS 49  BILITOT 0.6    PT/INR  Recent Labs  06/05/16 1727  LABPROT 13.5  INR 1.01    STUDIES: Ir Angiogram Visceral Selective  Result Date: 06/05/2016 INDICATION: 55 year old female with bloody stools and evidence of active splenic flexure diverticular bleed on CTA. She presents for mesenteric arteriogram and possible embolization if the bleeding vessel can be localized. EXAM: SELECTIVE VISCERAL ARTERIOGRAPHY; ADDITIONAL ARTERIOGRAPHY; IR ULTRASOUND GUIDANCE VASC ACCESS RIGHT Interventional Radiologist:  Criselda Peaches, MD MEDICATIONS: None ANESTHESIA/SEDATION: Fentanyl 50 mcg IV; Versed 3.5 mg IV Moderate Sedation Time:  36 minutes The patient was continuously monitored during the procedure by the interventional radiology nurse under my direct supervision. CONTRAST:  109 mL Isovue-300 FLUOROSCOPY TIME:  Fluoroscopy Time: 8 minutes 12 seconds (2150 mGy). COMPLICATIONS: None immediate. PROCEDURE: Informed consent was obtained from the patient following explanation of the procedure, risks, benefits and alternatives. The patient understands, agrees and consents for the procedure. All questions were addressed. A time out was performed. Maximal barrier sterile technique utilized including caps, mask, sterile gowns, sterile gloves, large sterile drape, hand hygiene, and Betadine skin prep. The right common femoral artery was interrogated with ultrasound and found to be widely patent. An image was obtained and stored for the medical record. Local anesthesia was attained by infiltration with 1% lidocaine. A small dermatotomy was made. Under real-time sonographic guidance, the vessel was punctured with a 21 gauge micropuncture needle. Using standard technique, the initial micro needle was exchanged over a 0.018 micro wire for a transitional 4 [place] micro sheath. The micro sheath was then exchanged over a 0.035 wire for a working 5 [place] vascular sheath. A rim catheter was advanced over a Bentson wire in used to select the  inferior mesenteric artery. In IMA arteriogram was performed. Vascular anatomy is excellent and not particularly tortuous. No evidence of active extravasation of contrast. No evidence of angiodysplasia, arteriovenous malformation or other vascular abnormality. Anatomy is conventional. A renegade STC micro catheter was then advanced in used to select the left colloid artery. A left folic arteriogram was performed. No evidence of active extravasation or vascular abnormality. The branch vessels supplying the segment of colon identified with the active bleed on the recent CTA are visible. The renegade STC was advanced into the more superior of the 2 branch vessels. Arteriography was performed. Again, no evidence of active bleeding or vascular abnormality. The micro catheter was then brought back in advanced into the more inferior of the 2 branch vessels. Arteriography was performed still with no evidence of active bleeding or vascular abnormality. The micro catheter and rim catheter were removed. A C2 cobra catheter was introduced over the Bentson wire in used to select the superior mesenteric artery. Arteriography was performed. The C2 cobra catheter is a deep within the SMA. The middle colloid artery does not opacified. There is no evidence of extravasation or abnormality in the visualized vessels. The cobra catheter was retracted slightly and arteriography repeated. The middle colloid artery can be identified. There is a suggestion of abnormality of 1 of the distal branches. Therefore, the renegade STC micro catheter was used to select the middle colloid artery an additional arteriography was performed. No evidence of active bleeding or vascular abnormality. The micro catheter was removed. As the cobra catheter was being removed, a was used to select the IMA 1 last time. A final arteriogram demonstrated no evidence of active hemorrhage. The catheter was removed. Hemostasis was attained with the assistance of a Cordis  Exoseal extra arterial vascular plug after common femoral access was confirmed by a limited right common femoral arteriogram. IMPRESSION: No evidence of active bleeding or vascular abnormality at this time. Diverticular bleed  seen on the recent CT arteriogram is no longer bleeding. Signed, Criselda Peaches, MD [facility] Rad[facility] Electronically Signed   By: Jacqulynn Cadet M.D.   On: 06/05/2016 16:27   Ir Angiogram Visceral Selective  Result Date: 06/05/2016 INDICATION: 55 year old female with bloody stools and evidence of active splenic flexure diverticular bleed on CTA. She presents for mesenteric arteriogram and possible embolization if the bleeding vessel can be localized. EXAM: SELECTIVE VISCERAL ARTERIOGRAPHY; ADDITIONAL ARTERIOGRAPHY; IR ULTRASOUND GUIDANCE VASC ACCESS RIGHT Interventional Radiologist:  Criselda Peaches, MD MEDICATIONS: None ANESTHESIA/SEDATION: Fentanyl 50 mcg IV; Versed 3.5 mg IV Moderate Sedation Time:  36 minutes The patient was continuously monitored during the procedure by the interventional radiology nurse under my direct supervision. CONTRAST:  109 mL Isovue-300 FLUOROSCOPY TIME:  Fluoroscopy Time: 8 minutes 12 seconds (2150 mGy). COMPLICATIONS: None immediate. PROCEDURE: Informed consent was obtained from the patient following explanation of the procedure, risks, benefits and alternatives. The patient understands, agrees and consents for the procedure. All questions were addressed. A time out was performed. Maximal barrier sterile technique utilized including caps, mask, sterile gowns, sterile gloves, large sterile drape, hand hygiene, and Betadine skin prep. The right common femoral artery was interrogated with ultrasound and found to be widely patent. An image was obtained and stored for the medical record. Local anesthesia was attained by infiltration with 1% lidocaine. A small dermatotomy was made. Under real-time  sonographic guidance, the vessel was punctured with a 21 gauge micropuncture needle. Using standard technique, the initial micro needle was exchanged over a 0.018 micro wire for a transitional 4 [place] micro sheath. The micro sheath was then exchanged over a 0.035 wire for a working 5 [place] vascular sheath. A rim catheter was advanced over a Bentson wire in used to select the inferior mesenteric artery. In IMA arteriogram was performed. Vascular anatomy is excellent and not particularly tortuous. No evidence of active extravasation of contrast. No evidence of angiodysplasia, arteriovenous malformation or other vascular abnormality. Anatomy is conventional. A renegade STC micro catheter was then advanced in used to select the left colloid artery. A left folic arteriogram was performed. No evidence of active extravasation or vascular abnormality. The branch vessels supplying the segment of colon identified with the active bleed on the recent CTA are visible. The renegade STC was advanced into the more superior of the 2 branch vessels. Arteriography was performed. Again, no evidence of active bleeding or vascular abnormality. The micro catheter was then brought back in advanced into the more inferior of the 2 branch vessels. Arteriography was performed still with no evidence of active bleeding or vascular abnormality. The micro catheter and rim catheter were removed. A C2 cobra catheter was introduced over the Bentson wire in used to select the superior mesenteric artery. Arteriography was performed. The C2 cobra catheter is a deep within the SMA. The middle colloid artery does not opacified. There is no evidence of extravasation or abnormality in the visualized vessels. The cobra catheter was retracted slightly and arteriography repeated. The middle colloid artery can be identified. There is a suggestion of abnormality of 1 of the distal branches. Therefore, the renegade STC micro catheter was used to select the  middle colloid artery an additional arteriography was performed. No evidence of active bleeding or vascular abnormality. The micro catheter was removed. As the cobra catheter was being removed, a was used to select the IMA 1 last time. A final arteriogram demonstrated no evidence of active hemorrhage. The catheter  was removed. Hemostasis was attained with the assistance of a Cordis Exoseal extra arterial vascular plug after common femoral access was confirmed by a limited right common femoral arteriogram. IMPRESSION: No evidence of active bleeding or vascular abnormality at this time. Diverticular bleed seen on the recent CT arteriogram is no longer bleeding. Signed, Criselda Peaches, MD [facility] Radiology Electronically Signed   By: Jacqulynn Cadet M.D.   On: 06/05/2016 16:27   Ir Angiogram Selective Each Additional Vessel  Result Date: 06/05/2016 INDICATION: 55 year old female with bloody stools and evidence of active splenic flexure diverticular bleed on CTA. She presents for mesenteric arteriogram and possible embolization if the bleeding vessel can be localized. EXAM: SELECTIVE VISCERAL ARTERIOGRAPHY; ADDITIONAL ARTERIOGRAPHY; IR ULTRASOUND GUIDANCE VASC ACCESS RIGHT Interventional Radiologist:  Criselda Peaches, MD MEDICATIONS: None ANESTHESIA/SEDATION: Fentanyl 50 mcg IV; Versed 3.5 mg IV Moderate Sedation Time:  36 minutes The patient was continuously monitored during the procedure by the interventional radiology nurse under my direct supervision. CONTRAST:  109 mL Isovue-300 FLUOROSCOPY TIME:  Fluoroscopy Time: 8 minutes 12 seconds (2150 mGy). COMPLICATIONS: None immediate. PROCEDURE: Informed consent was obtained from the patient following explanation of the procedure, risks, benefits and alternatives. The patient understands, agrees and consents for the procedure. All questions were addressed. A time out was performed. Maximal barrier sterile  technique utilized including caps, mask, sterile gowns, sterile gloves, large sterile drape, hand hygiene, and Betadine skin prep. The right common femoral artery was interrogated with ultrasound and found to be widely patent. An image was obtained and stored for the medical record. Local anesthesia was attained by infiltration with 1% lidocaine. A small dermatotomy was made. Under real-time sonographic guidance, the vessel was punctured with a 21 gauge micropuncture needle. Using standard technique, the initial micro needle was exchanged over a 0.018 micro wire for a transitional 4 [place] micro sheath. The micro sheath was then exchanged over a 0.035 wire for a working 5 [place] vascular sheath. A rim catheter was advanced over a Bentson wire in used to select the inferior mesenteric artery. In IMA arteriogram was performed. Vascular anatomy is excellent and not particularly tortuous. No evidence of active extravasation of contrast. No evidence of angiodysplasia, arteriovenous malformation or other vascular abnormality. Anatomy is conventional. A renegade STC micro catheter was then advanced in used to select the left colloid artery. A left folic arteriogram was performed. No evidence of active extravasation or vascular abnormality. The branch vessels supplying the segment of colon identified with the active bleed on the recent CTA are visible. The renegade STC was advanced into the more superior of the 2 branch vessels. Arteriography was performed. Again, no evidence of active bleeding or vascular abnormality. The micro catheter was then brought back in advanced into the more inferior of the 2 branch vessels. Arteriography was performed still with no evidence of active bleeding or vascular abnormality. The micro catheter and rim catheter were removed. A C2 cobra catheter was introduced over the Bentson wire in used to select the superior mesenteric artery. Arteriography was performed. The C2 cobra catheter is a  deep within the SMA. The middle colloid artery does not opacified. There is no evidence of extravasation or abnormality in the visualized vessels. The cobra catheter was retracted slightly and arteriography repeated. The middle colloid artery can be identified. There is a suggestion of abnormality of 1 of the distal branches. Therefore, the renegade STC micro catheter was used to select the middle colloid artery an  additional arteriography was performed. No evidence of active bleeding or vascular abnormality. The micro catheter was removed. As the cobra catheter was being removed, a was used to select the IMA 1 last time. A final arteriogram demonstrated no evidence of active hemorrhage. The catheter was removed. Hemostasis was attained with the assistance of a Cordis Exoseal extra arterial vascular plug after common femoral access was confirmed by a limited right common femoral arteriogram. IMPRESSION: No evidence of active bleeding or vascular abnormality at this time. Diverticular bleed seen on the recent CT arteriogram is no longer bleeding. Signed, Criselda Peaches, MD [facility] Radiology Electronically Signed   By: Jacqulynn Cadet M.D.   On: 06/05/2016 16:27   Ir Angiogram Selective Each Additional Vessel  Result Date: 06/05/2016 INDICATION: 55 year old female with bloody stools and evidence of active splenic flexure diverticular bleed on CTA. She presents for mesenteric arteriogram and possible embolization if the bleeding vessel can be localized. EXAM: SELECTIVE VISCERAL ARTERIOGRAPHY; ADDITIONAL ARTERIOGRAPHY; IR ULTRASOUND GUIDANCE VASC ACCESS RIGHT Interventional Radiologist:  Criselda Peaches, MD MEDICATIONS: None ANESTHESIA/SEDATION: Fentanyl 50 mcg IV; Versed 3.5 mg IV Moderate Sedation Time:  36 minutes The patient was continuously monitored during the procedure by the interventional radiology nurse under my direct supervision. CONTRAST:   109 mL Isovue-300 FLUOROSCOPY TIME:  Fluoroscopy Time: 8 minutes 12 seconds (2150 mGy). COMPLICATIONS: None immediate. PROCEDURE: Informed consent was obtained from the patient following explanation of the procedure, risks, benefits and alternatives. The patient understands, agrees and consents for the procedure. All questions were addressed. A time out was performed. Maximal barrier sterile technique utilized including caps, mask, sterile gowns, sterile gloves, large sterile drape, hand hygiene, and Betadine skin prep. The right common femoral artery was interrogated with ultrasound and found to be widely patent. An image was obtained and stored for the medical record. Local anesthesia was attained by infiltration with 1% lidocaine. A small dermatotomy was made. Under real-time sonographic guidance, the vessel was punctured with a 21 gauge micropuncture needle. Using standard technique, the initial micro needle was exchanged over a 0.018 micro wire for a transitional 4 [place] micro sheath. The micro sheath was then exchanged over a 0.035 wire for a working 5 [place] vascular sheath. A rim catheter was advanced over a Bentson wire in used to select the inferior mesenteric artery. In IMA arteriogram was performed. Vascular anatomy is excellent and not particularly tortuous. No evidence of active extravasation of contrast. No evidence of angiodysplasia, arteriovenous malformation or other vascular abnormality. Anatomy is conventional. A renegade STC micro catheter was then advanced in used to select the left colloid artery. A left folic arteriogram was performed. No evidence of active extravasation or vascular abnormality. The branch vessels supplying the segment of colon identified with the active bleed on the recent CTA are visible. The renegade STC was advanced into the more superior of the 2 branch vessels. Arteriography was performed. Again, no evidence of active bleeding or vascular abnormality. The micro  catheter was then brought back in advanced into the more inferior of the 2 branch vessels. Arteriography was performed still with no evidence of active bleeding or vascular abnormality. The micro catheter and rim catheter were removed. A C2 cobra catheter was introduced over the Bentson wire in used to select the superior mesenteric artery. Arteriography was performed. The C2 cobra catheter is a deep within the SMA. The middle colloid artery does not opacified. There is no evidence of extravasation or abnormality in the  visualized vessels. The cobra catheter was retracted slightly and arteriography repeated. The middle colloid artery can be identified. There is a suggestion of abnormality of 1 of the distal branches. Therefore, the renegade STC micro catheter was used to select the middle colloid artery an additional arteriography was performed. No evidence of active bleeding or vascular abnormality. The micro catheter was removed. As the cobra catheter was being removed, a was used to select the IMA 1 last time. A final arteriogram demonstrated no evidence of active hemorrhage. The catheter was removed. Hemostasis was attained with the assistance of a Cordis Exoseal extra arterial vascular plug after common femoral access was confirmed by a limited right common femoral arteriogram. IMPRESSION: No evidence of active bleeding or vascular abnormality at this time. Diverticular bleed seen on the recent CT arteriogram is no longer bleeding. Signed, Criselda Peaches, MD [facility] Radiology Electronically Signed   By: Jacqulynn Cadet M.D.   On: 06/05/2016 16:27   Ir Angiogram Selective Each Additional Vessel  Result Date: 06/05/2016 INDICATION: 55 year old female with bloody stools and evidence of active splenic flexure diverticular bleed on CTA. She presents for mesenteric arteriogram and possible embolization if the bleeding vessel can be localized. EXAM:  SELECTIVE VISCERAL ARTERIOGRAPHY; ADDITIONAL ARTERIOGRAPHY; IR ULTRASOUND GUIDANCE VASC ACCESS RIGHT Interventional Radiologist:  Criselda Peaches, MD MEDICATIONS: None ANESTHESIA/SEDATION: Fentanyl 50 mcg IV; Versed 3.5 mg IV Moderate Sedation Time:  36 minutes The patient was continuously monitored during the procedure by the interventional radiology nurse under my direct supervision. CONTRAST:  109 mL Isovue-300 FLUOROSCOPY TIME:  Fluoroscopy Time: 8 minutes 12 seconds (2150 mGy). COMPLICATIONS: None immediate. PROCEDURE: Informed consent was obtained from the patient following explanation of the procedure, risks, benefits and alternatives. The patient understands, agrees and consents for the procedure. All questions were addressed. A time out was performed. Maximal barrier sterile technique utilized including caps, mask, sterile gowns, sterile gloves, large sterile drape, hand hygiene, and Betadine skin prep. The right common femoral artery was interrogated with ultrasound and found to be widely patent. An image was obtained and stored for the medical record. Local anesthesia was attained by infiltration with 1% lidocaine. A small dermatotomy was made. Under real-time sonographic guidance, the vessel was punctured with a 21 gauge micropuncture needle. Using standard technique, the initial micro needle was exchanged over a 0.018 micro wire for a transitional 4 [place] micro sheath. The micro sheath was then exchanged over a 0.035 wire for a working 5 [place] vascular sheath. A rim catheter was advanced over a Bentson wire in used to select the inferior mesenteric artery. In IMA arteriogram was performed. Vascular anatomy is excellent and not particularly tortuous. No evidence of active extravasation of contrast. No evidence of angiodysplasia, arteriovenous malformation or other vascular abnormality. Anatomy is conventional. A renegade STC micro catheter was then advanced in used to select the left colloid  artery. A left folic arteriogram was performed. No evidence of active extravasation or vascular abnormality. The branch vessels supplying the segment of colon identified with the active bleed on the recent CTA are visible. The renegade STC was advanced into the more superior of the 2 branch vessels. Arteriography was performed. Again, no evidence of active bleeding or vascular abnormality. The micro catheter was then brought back in advanced into the more inferior of the 2 branch vessels. Arteriography was performed still with no evidence of active bleeding or vascular abnormality. The micro catheter and rim catheter were removed. A C2 cobra  catheter was introduced over the Bentson wire in used to select the superior mesenteric artery. Arteriography was performed. The C2 cobra catheter is a deep within the SMA. The middle colloid artery does not opacified. There is no evidence of extravasation or abnormality in the visualized vessels. The cobra catheter was retracted slightly and arteriography repeated. The middle colloid artery can be identified. There is a suggestion of abnormality of 1 of the distal branches. Therefore, the renegade STC micro catheter was used to select the middle colloid artery an additional arteriography was performed. No evidence of active bleeding or vascular abnormality. The micro catheter was removed. As the cobra catheter was being removed, a was used to select the IMA 1 last time. A final arteriogram demonstrated no evidence of active hemorrhage. The catheter was removed. Hemostasis was attained with the assistance of a Cordis Exoseal extra arterial vascular plug after common femoral access was confirmed by a limited right common femoral arteriogram. IMPRESSION: No evidence of active bleeding or vascular abnormality at this time. Diverticular bleed seen on the recent CT arteriogram is no longer bleeding. Signed, Criselda Peaches, MD [facility]  [facility] Radiology Electronically Signed   By: Jacqulynn Cadet M.D.   On: 06/05/2016 16:27   Ir Angiogram Selective Each Additional Vessel  Result Date: 06/05/2016 INDICATION: 55 year old female with bloody stools and evidence of active splenic flexure diverticular bleed on CTA. She presents for mesenteric arteriogram and possible embolization if the bleeding vessel can be localized. EXAM: SELECTIVE VISCERAL ARTERIOGRAPHY; ADDITIONAL ARTERIOGRAPHY; IR ULTRASOUND GUIDANCE VASC ACCESS RIGHT Interventional Radiologist:  Criselda Peaches, MD MEDICATIONS: None ANESTHESIA/SEDATION: Fentanyl 50 mcg IV; Versed 3.5 mg IV Moderate Sedation Time:  36 minutes The patient was continuously monitored during the procedure by the interventional radiology nurse under my direct supervision. CONTRAST:  109 mL Isovue-300 FLUOROSCOPY TIME:  Fluoroscopy Time: 8 minutes 12 seconds (2150 mGy). COMPLICATIONS: None immediate. PROCEDURE: Informed consent was obtained from the patient following explanation of the procedure, risks, benefits and alternatives. The patient understands, agrees and consents for the procedure. All questions were addressed. A time out was performed. Maximal barrier sterile technique utilized including caps, mask, sterile gowns, sterile gloves, large sterile drape, hand hygiene, and Betadine skin prep. The right common femoral artery was interrogated with ultrasound and found to be widely patent. An image was obtained and stored for the medical record. Local anesthesia was attained by infiltration with 1% lidocaine. A small dermatotomy was made. Under real-time sonographic guidance, the vessel was punctured with a 21 gauge micropuncture needle. Using standard technique, the initial micro needle was exchanged over a 0.018 micro wire for a transitional 4 [place] micro sheath. The micro sheath was then exchanged over a 0.035 wire for a working 5 [place] vascular sheath. A rim catheter was advanced over a Bentson  wire in used to select the inferior mesenteric artery. In IMA arteriogram was performed. Vascular anatomy is excellent and not particularly tortuous. No evidence of active extravasation of contrast. No evidence of angiodysplasia, arteriovenous malformation or other vascular abnormality. Anatomy is conventional. A renegade STC micro catheter was then advanced in used to select the left colloid artery. A left folic arteriogram was performed. No evidence of active extravasation or vascular abnormality. The branch vessels supplying the segment of colon identified with the active bleed on the recent CTA are visible. The renegade STC was advanced into the more superior of the 2 branch vessels. Arteriography was performed. Again, no evidence of active bleeding  or vascular abnormality. The micro catheter was then brought back in advanced into the more inferior of the 2 branch vessels. Arteriography was performed still with no evidence of active bleeding or vascular abnormality. The micro catheter and rim catheter were removed. A C2 cobra catheter was introduced over the Bentson wire in used to select the superior mesenteric artery. Arteriography was performed. The C2 cobra catheter is a deep within the SMA. The middle colloid artery does not opacified. There is no evidence of extravasation or abnormality in the visualized vessels. The cobra catheter was retracted slightly and arteriography repeated. The middle colloid artery can be identified. There is a suggestion of abnormality of 1 of the distal branches. Therefore, the renegade STC micro catheter was used to select the middle colloid artery an additional arteriography was performed. No evidence of active bleeding or vascular abnormality. The micro catheter was removed. As the cobra catheter was being removed, a was used to select the IMA 1 last time. A final arteriogram demonstrated no evidence of active hemorrhage. The catheter was removed. Hemostasis was attained with  the assistance of a Cordis Exoseal extra arterial vascular plug after common femoral access was confirmed by a limited right common femoral arteriogram. IMPRESSION: No evidence of active bleeding or vascular abnormality at this time. Diverticular bleed seen on the recent CT arteriogram is no longer bleeding. Signed, Criselda Peaches, MD [facility] Radiology Electronically Signed   By: Jacqulynn Cadet M.D.   On: 06/05/2016 16:27   Ir US Guide Vasc Access Right  Result Date: 06/05/2016 INDICATION: 55 year old female with bloody stools and evidence of active splenic flexure diverticular bleed on CTA. She presents for mesenteric arteriogram and possible embolization if the bleeding vessel can be localized. EXAM: SELECTIVE VISCERAL ARTERIOGRAPHY; ADDITIONAL ARTERIOGRAPHY; IR ULTRASOUND GUIDANCE VASC ACCESS RIGHT Interventional Radiologist:  Criselda Peaches, MD MEDICATIONS: None ANESTHESIA/SEDATION: Fentanyl 50 mcg IV; Versed 3.5 mg IV Moderate Sedation Time:  36 minutes The patient was continuously monitored during the procedure by the interventional radiology nurse under my direct supervision. CONTRAST:  109 mL Isovue-300 FLUOROSCOPY TIME:  Fluoroscopy Time: 8 minutes 12 seconds (2150 mGy). COMPLICATIONS: None immediate. PROCEDURE: Informed consent was obtained from the patient following explanation of the procedure, risks, benefits and alternatives. The patient understands, agrees and consents for the procedure. All questions were addressed. A time out was performed. Maximal barrier sterile technique utilized including caps, mask, sterile gowns, sterile gloves, large sterile drape, hand hygiene, and Betadine skin prep. The right common femoral artery was interrogated with ultrasound and found to be widely patent. An image was obtained and stored for the medical record. Local anesthesia was attained by infiltration with 1% lidocaine. A small dermatotomy was  made. Under real-time sonographic guidance, the vessel was punctured with a 21 gauge micropuncture needle. Using standard technique, the initial micro needle was exchanged over a 0.018 micro wire for a transitional 4 [place] micro sheath. The micro sheath was then exchanged over a 0.035 wire for a working 5 [place] vascular sheath. A rim catheter was advanced over a Bentson wire in used to select the inferior mesenteric artery. In IMA arteriogram was performed. Vascular anatomy is excellent and not particularly tortuous. No evidence of active extravasation of contrast. No evidence of angiodysplasia, arteriovenous malformation or other vascular abnormality. Anatomy is conventional. A renegade STC micro catheter was then advanced in used to select the left colloid artery. A left folic arteriogram was performed. No evidence of active extravasation  or vascular abnormality. The branch vessels supplying the segment of colon identified with the active bleed on the recent CTA are visible. The renegade STC was advanced into the more superior of the 2 branch vessels. Arteriography was performed. Again, no evidence of active bleeding or vascular abnormality. The micro catheter was then brought back in advanced into the more inferior of the 2 branch vessels. Arteriography was performed still with no evidence of active bleeding or vascular abnormality. The micro catheter and rim catheter were removed. A C2 cobra catheter was introduced over the Bentson wire in used to select the superior mesenteric artery. Arteriography was performed. The C2 cobra catheter is a deep within the SMA. The middle colloid artery does not opacified. There is no evidence of extravasation or abnormality in the visualized vessels. The cobra catheter was retracted slightly and arteriography repeated. The middle colloid artery can be identified. There is a suggestion of abnormality of 1 of the distal branches. Therefore, the renegade STC micro catheter was  used to select the middle colloid artery an additional arteriography was performed. No evidence of active bleeding or vascular abnormality. The micro catheter was removed. As the cobra catheter was being removed, a was used to select the IMA 1 last time. A final arteriogram demonstrated no evidence of active hemorrhage. The catheter was removed. Hemostasis was attained with the assistance of a Cordis Exoseal extra arterial vascular plug after common femoral access was confirmed by a limited right common femoral arteriogram. IMPRESSION: No evidence of active bleeding or vascular abnormality at this time. Diverticular bleed seen on the recent CT arteriogram is no longer bleeding. Signed, Criselda Peaches, MD [facility] Radiology Electronically Signed   By: Jacqulynn Cadet M.D.   On: 06/05/2016 16:27   Ct Cta Abd/pel W/cm &/or W/o Cm  Result Date: 06/05/2016 CLINICAL DATA:  55 year old female with central lower abdominal pain since earlier this morning and bloody stools. EXAM: CTA ABDOMEN AND PELVIS wITHOUT AND WITH CONTRAST TECHNIQUE: Multidetector CT imaging of the abdomen and pelvis was performed using the standard protocol during bolus administration of intravenous contrast. Multiplanar reconstructed images and MIPs were obtained and reviewed to evaluate the vascular anatomy. CONTRAST:  100 mL Isovue 370 COMPARISON:  CT abdomen/ pelvis 07/15/2015 FINDINGS: VASCULAR Aorta: Normal caliber abdominal aorta. No evidence of aneurysm, dissection or significant atherosclerotic plaque. Celiac: Widely patent and unremarkable. Incidentally, left hepatic artery is replaced to the left gastric artery. SMA: The widely patent unremarkable. Renals: Single dominant renal arteries which are widely patent. No changes of fibromuscular dysplasia. Additionally, there is a small accessory renal artery to the right lower pole. IMA: Patent and unremarkable. Inflow: Patent and  unremarkable. Proximal Outflow: Paid unremarkable. Veins: No focal venous abnormality. NON-VASCULAR Lower Chest: The lung bases are clear. Visualized cardiac structures are within normal limits for size. No pericardial effusion. Unremarkable visualized distal thoracic esophagus. Abdomen: Unremarkable CT appearance of the stomach, duodenum, spleen, left adrenal gland and pancreas. The right adrenal gland is surgically absent. The liver is diffusely low in attenuation consistent with at least at moderate hepatic steatosis. The gallbladder is surgically absent. No intra or extrahepatic biliary ductal dilatation. 2.3 cm circumscribed water attenuation cyst in the interpolar right kidney consistent with a simple cyst. There is a second smaller 1.1 cm cyst also in the interpolar right kidney. Similarly, there is a simple cyst in the lower pole of the left kidney measuring 1.7 cm. No hydronephrosis, nephrolithiasis or enhancing renal lesion.  Extensive colonic diverticulosis predominantly involving the descending and sigmoid colon. High attenuation material is present within a diverticulum along the mesenteric border of the proximal descending colon in the region of the splenic flexure (image 54 series 4) high attenuation material can be seen within the adjacent colonic lumen as well. On the venous phase images this material has dispersed consistent with contrast extravasation. No findings to suggest acute diverticulitis. Diverticulosis also involves the cecum and ascending colon. Normal appendix in the right lower quadrant. No small bowel wall thickening or evidence of obstruction. No free fluid or suspicious adenopathy. Pelvis: Large multi fibroid uterus with extensive dystrophic calcifications. The bladder is unremarkable. Unremarkable appearance of the adnexa. No free fluid or suspicious adenopathy. Bones/Soft Tissues: No acute osseous abnormality. Review of the MIP images confirms the above findings. IMPRESSION:  VASCULAR 1. Active diverticular bleed in the proximal descending colon just beyond the splenic flexure. 2. No significant atherosclerotic vascular disease. NON VASCULAR 1. Extensive colonic diverticulosis. No evidence of active diverticulitis. 2. At least moderate hepatic steatosis. 3. Simple renal cysts noted incidentally. 4. Fibroid uterus. 5. 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 These results were called by telephone at the time of interpretation on 06/05/2016 at 2:19 pm to Dr. Jeannett Senior , who verbally acknowledged these results. Signed, Criselda Peaches, MD [facility] Radiology Electronically Signed   By: Jacqulynn Cadet M.D.   On: 06/05/2016 14:49     PREVIOUS ENDOSCOPIES:            See history of present illness   Impression / Plan:  Impression 1. Hematochezia: Started on Friday, 06/05/16, 8 episodes since then, no preceding abdominal pain, positive CT angiogram, INR unable to embolize due to no bleeding at time of their angiogram, hemoglobin has decreased from 14-10.8 since time of admission; CTA confirms diverticular bleed 2. Acute blood loss anemia: See above  Plan: 1. Return patient to clear liquid diet 2. Continue to observe patient for further signs of acute bleeding with serial hemoglobin checks and transfusion as necessary less than 7 3. Continue supportive measures 4. Will discuss with Dr. [name], patient may benefit from a colonoscopy in the near future if she has continued bleeding vs repeat bleeding scan an IR intervention 5. Please await any further recommendations  Thank you for your kind consultation, we will continue to follow.  Lavone Nian [facility]  06/07/2016, 10:53 AM Pager #: [phone]   ________________________________________________________________________  Velora Heckler GI MD note:  I personally examined the patient, reviewed the data and agree with the assessment and  plan described above. This is at least her second episode of diverticular bleeding (07/2015 admission, mild abd discomfort but NO inflammation on CT, did not need blood transfusion).  This time, painless hematochezia and CT angio showing descending colon active bleeding in segment of extensive diverticulosis. IR angiogram unfortunately neg for active bleeding. Bleeding was slowing (dark marroon) but then yesterday two episodes of BRB again. This AM her stool was dark again. I think she's having a stuttering diverticular bleed. She has not need blood transfusion fortunately.  I recommend decreasing diet back to clears, observe another 24 hours at least. If BRB returns will have to consider prepping, colonoscopy.  It is usually very difficult to find exact source of bleeding during colonoscopy however we at least have target area (proximal descending colon) from the positive CT angiogram 2 days ago.    Owens Loffler, MD [facility] Gastroenterology Pager [phone]

## 2016-06-08 LAB — BASIC METABOLIC PANEL
ANION GAP: 5 (ref 5–15)
BUN: 8 mg/dL (ref 6–20)
CALCIUM: 8.7 mg/dL — AB (ref 8.9–10.3)
CO2: 27 mmol/L (ref 22–32)
Chloride: 108 mmol/L (ref 101–111)
Creatinine, Ser: 0.61 mg/dL (ref 0.44–1.00)
Glucose, Bld: 133 mg/dL — ABNORMAL HIGH (ref 65–99)
Potassium: 3.7 mmol/L (ref 3.5–5.1)
SODIUM: 140 mmol/L (ref 135–145)

## 2016-06-08 LAB — CBC
HCT: 34.5 % — ABNORMAL LOW (ref 36.0–46.0)
HEMOGLOBIN: 10.5 g/dL — AB (ref 12.0–15.0)
MCH: 21.5 pg — ABNORMAL LOW (ref 26.0–34.0)
MCHC: 30.4 g/dL (ref 30.0–36.0)
MCV: 70.7 fL — ABNORMAL LOW (ref 78.0–100.0)
Platelets: 277 10*3/uL (ref 150–400)
RBC: 4.88 MIL/uL (ref 3.87–5.11)
RDW: 17.4 % — ABNORMAL HIGH (ref 11.5–15.5)
WBC: 8.4 10*3/uL (ref 4.0–10.5)

## 2016-06-08 LAB — HEMOGLOBIN A1C
Hgb A1c MFr Bld: 7.6 % — ABNORMAL HIGH (ref 4.8–5.6)
Mean Plasma Glucose: 171 mg/dL

## 2016-06-08 LAB — GLUCOSE, CAPILLARY
GLUCOSE-CAPILLARY: 128 mg/dL — AB (ref 65–99)
GLUCOSE-CAPILLARY: 109 mg/dL — AB (ref 65–99)
GLUCOSE-CAPILLARY: 108 mg/dL — AB (ref 65–99)
Glucose-Capillary: 123 mg/dL — ABNORMAL HIGH (ref 65–99)

## 2016-06-08 MED ORDER — FAMOTIDINE 20 MG PO TABS
20.0000 mg | ORAL_TABLET | Freq: Two times a day (BID) | ORAL | Status: DC
  Administered 2016-06-08 – 2016-06-09 (×2): 20 mg via ORAL
  Filled 2016-06-08 (×2): qty 1

## 2016-06-08 MED ORDER — LORATADINE 10 MG PO TABS
10.0000 mg | ORAL_TABLET | Freq: Every day | ORAL | Status: DC
  Administered 2016-06-08 – 2016-06-09 (×3): 10 mg via ORAL
  Filled 2016-06-08 (×3): qty 1

## 2016-06-08 MED ORDER — ADULT MULTIVITAMIN W/MINERALS CH
1.0000 | ORAL_TABLET | Freq: Every day | ORAL | Status: DC
  Administered 2016-06-08: 1 via ORAL
  Filled 2016-06-08 (×2): qty 1

## 2016-06-08 NOTE — Progress Notes (Signed)
Patient ID: Carol Mueller, female   DOB: Jul 07, 1961, 55 y.o.   MRN: [id number]  PROGRESS NOTE    Carol Mueller  [id number] DOB: 1961/02/13 DOA: 06/05/2016  PCP: Delman Cheadle, MD   Brief Narrative:  55 y.o. female with past medical history significant for diabetes mellitus, HTN and polycystic ovary disease who presented to [facility] with rectal bleed with and without bowel movement. She underwent  CT angiography which showed active bleeding but when underwent visceral angiogram no active bleed detected. She had bleeding with BM 7/22 and then seen by GI in consultation.    Assessment & Plan:   Principal Problem:   Diverticulosis of colon with hemorrhage / Lower GI bleed / Acute blood loss anemia  - Lower GI bleed secondary to a diverticular bleed - Noted active bleeding on CTA but when underwent visceral angiogram no active bleed detected - She had 2 episodes of blood with BM 7/22 and hemoglobin dropped from 12.7 to 10.8 - GI consulted and recommended observation, low residue diet tomorrow if no further bleed - If she does bleed then plan for colonoscopy - Continue pepcid IV 20 mg Q 12 hours      Essential hypertension - Continue carvedilol and losartan  - Continue hctz     Diabetes mellitus without complications with long term insulin use - Continue Lantus 20 units at bedtime and SSI - CBG's in past 24 hours: 167, 188, 128    DVT prophylaxis: SCD's bilaterally  Code Status: full code  Family Communication: no family at the bedside this am Disposition Plan: hopefully home in am if no bleed and hgb stable    Consultants:   IR  GI  Procedures:  Angiogram 7/21  Antimicrobials:  None    Subjective: No overnight events.   Objective: Vitals:   06/07/16 1335 06/07/16 1806 06/07/16 2123 06/08/16 0551  BP: 118/66 135/62 103/76 107/72  Pulse: 86 81 88 77  Resp:      Temp: 98.9 F (37.2 C)  97.9 F (36.6 C) 98.6 F (37 C)  TempSrc: Oral  Oral Oral  SpO2: 100% 99%  98% 94%  Weight:      Height:        Intake/Output Summary (Last 24 hours) at 06/08/16 0955 Last data filed at 06/08/16 0900  Gross per 24 hour  Intake          3193.33 ml  Output                0 ml  Net          3193.33 ml   Filed Weights   06/05/16 1700  Weight: 112.6 kg (248 lb 3.8 oz)    Examination:  General exam: no distress, calm Respiratory system: Respiratory effort normal. No wheezing  Cardiovascular system: S1 & S2 (+), RRR Gastrointestinal system: (+) BS, non tender, non distended  Central nervous system: Nonfocal  Extremities: no edema, palpable pulses bilaterally  Skin: no lesions, no ulcers  Psychiatry: Normal mood    Data Reviewed: I have personally reviewed following labs and imaging studies  CBC:  Recent Labs Lab 06/05/16 1147 06/06/16 0550 06/07/16 0531 06/08/16 0523  WBC 9.8 9.8 8.5 8.4  HGB 14.0 12.7 10.8* 10.5*  HCT 45.2 40.3 36.0 34.5*  MCV 70.1* 68.7* 70.9* 70.7*  PLT 344 298 268 [id number]   Basic Metabolic Panel:  Recent Labs Lab 06/05/16 1147 06/06/16 0550 06/07/16 0531 06/08/16 0523  NA 138 137 139 140  K 3.7 3.6  3.7 3.7  CL 101 101 107 108  CO2 28 30 27 27   GLUCOSE 131* 108* 136* 133*  BUN 23* 15 14 8   CREATININE 0.84 0.79 0.71 0.61  CALCIUM 9.3 8.9 8.4* 8.7*   GFR: Estimated Creatinine Clearance: 96.6 mL/min (by C-G formula based on SCr of 0.8 mg/dL). Liver Function Tests:  Recent Labs Lab 06/05/16 1147  AST 19  ALT 20  ALKPHOS 49  BILITOT 0.6  PROT 8.3*  ALBUMIN 4.5   No results for input(s): LIPASE, AMYLASE in the last 168 hours. No results for input(s): AMMONIA in the last 168 hours. Coagulation Profile:  Recent Labs Lab 06/05/16 1727  INR 1.01   Cardiac Enzymes: No results for input(s): CKTOTAL, CKMB, CKMBINDEX, TROPONINI in the last 168 hours. BNP (last 3 results) No results for input(s): PROBNP in the last 8760 hours. HbA1C:  Recent Labs  06/05/16 1727  HGBA1C 7.6*   CBG:  Recent Labs Lab  06/07/16 0714 06/07/16 1157 06/07/16 1657 06/07/16 2128 06/08/16 0739  GLUCAP 158* 140* 167* 188* 128*   Lipid Profile: No results for input(s): CHOL, HDL, LDLCALC, TRIG, CHOLHDL, LDLDIRECT in the last 72 hours. Thyroid Function Tests:  Recent Labs  06/05/16 1727  TSH 0.628   Anemia Panel: No results for input(s): VITAMINB12, FOLATE, FERRITIN, TIBC, IRON, RETICCTPCT in the last 72 hours. Urine analysis:    Component Value Date/Time   BILIRUBINUR neg 08/25/2014 1157   PROTEINUR neg 08/25/2014 1157   UROBILINOGEN 0.2 08/25/2014 1157   NITRITE neg 08/25/2014 1157   LEUKOCYTESUR Negative 08/25/2014 1157   Sepsis Labs: @LABRCNTIP (procalcitonin:4,lacticidven:4)   )No results found for this or any previous visit (from the past 240 hour(s)).    Radiology Studies: Ir Angiogram Visceral Selective 06/05/2016  No evidence of active bleeding or vascular abnormality at this time. Diverticular bleed seen on the recent CT arteriogram is no longer bleeding.   Ir Angiogram Visceral Selective 06/05/2016  No evidence of active bleeding or vascular abnormality at this time. Diverticular bleed seen on the recent CT arteriogram is no longer bleeding.   Ir Angiogram Selective Each Additional Vessel 06/05/2016  No evidence of active bleeding or vascular abnormality at this time. Diverticular bleed seen on the recent CT arteriogram is no longer bleeding.   Ir Angiogram Selective Each Additional Vessel 06/05/2016   No evidence of active bleeding or vascular abnormality at this time. Diverticular bleed seen on the recent CT arteriogram is no longer bleeding.   Ir Angiogram Selective Each Additional Vessel 06/05/2016 No evidence of active bleeding or vascular abnormality at this time. Diverticular bleed seen on the recent CT arteriogram is no longer bleeding.   Ir Angiogram Selective Each Additional Vessel 06/05/2016  No evidence of active bleeding or vascular abnormality at this time. Diverticular  bleed seen on the recent CT arteriogram is no longer bleeding.   Ir US Guide Vasc Access Right 06/05/2016 No evidence of active bleeding or vascular abnormality at this time. Diverticular bleed seen on the recent CT arteriogram is no longer bleeding.   Ct Cta Abd/pel W/cm &/or W/o Cm 06/05/2016   VASCULAR 1. Active diverticular bleed in the proximal descending colon just beyond the splenic flexure. 2. No significant atherosclerotic vascular disease. NON VASCULAR 1. Extensive colonic diverticulosis. No evidence of active diverticulitis. 2. At least moderate hepatic steatosis. 3. Simple renal cysts noted incidentally. 4. Fibroid uterus. 5. 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    Scheduled Meds: . carvedilol  3.125 mg Oral  BID WC  . famotidine (PEPCID) IV  20 mg Intravenous Q12H  . losartan  100 mg Oral Daily   And  . hydrochlorothiazide  25 mg Oral Daily  . insulin aspart  0-9 Units Subcutaneous TID WC  . insulin glargine  20 Units Subcutaneous Q2200  . loratadine  10 mg Oral Daily  . metFORMIN  1,000 mg Oral BID WC  . multivitamin with minerals  1 tablet Oral Daily   Continuous Infusions: . sodium chloride 100 mL/hr at 06/08/16 0900     LOS: 3 days    Time spent: 15 minutes  Greater than 50% of the time spent on counseling and coordinating the care.   Leisa Lenz, MD [facility] Pager [phone]  If 7PM-7AM, please contact night-coverage [email or web address] Password [facility] 06/08/2016, 9:55 AM

## 2016-06-08 NOTE — Progress Notes (Signed)
    Progress Note   Subjective  Chief Complaint:Diverticular bleed  This morning patient was found making a back to the chair by her bedside, she tells me she has had no further bowel movements since her last one before time of interview yesterday, and that one was "red/maroon". Patient has continued with some left lower quadrant discomfort off and on but she can "live with that". She has been tolerating a clear liquid diet. She denies any new complaints or concerns.    Objective   Vital signs in last 24 hours: Temp:  [97.9 F (36.6 C)-98.9 F (37.2 C)] 98.6 F (37 C) (07/24 0551) Pulse Rate:  [77-91] 77 (07/24 0551) BP: (103-135)/(62-77) 107/72 (07/24 0551) SpO2:  [94 %-100 %] 94 % (07/24 0551) Last BM Date: 06/06/16 General: African American female in NAD Heart:  Regular rate and rhythm; no murmurs Lungs: Respirations even and unlabored, lungs CTA bilaterally Abdomen:  Soft, nontender and nondistended. Normal bowel sounds. Extremities:  Without edema. Neurologic:  Alert and oriented,  grossly normal neurologically. Psych:  Cooperative. Normal mood and affect.  Intake/Output from previous day: 07/23 0701 - 07/24 0700 In: 3093.3 [P.O.:480; I.V.:2513.3; IV Piggyback:100] Out: -   Lab Results:  Recent Labs  06/06/16 0550 06/07/16 0531 06/08/16 0523  WBC 9.8 8.5 8.4  HGB 12.7 10.8* 10.5*  HCT 40.3 36.0 34.5*  PLT 298 268 277   BMET  Recent Labs  06/06/16 0550 06/07/16 0531 06/08/16 0523  NA 137 139 140  K 3.6 3.7 3.7  CL 101 107 108  CO2 30 27 27   GLUCOSE 108* 136* 133*  BUN 15 14 8   CREATININE 0.79 0.71 0.61  CALCIUM 8.9 8.4* 8.7*   LFT  Recent Labs  06/05/16 1147  PROT 8.3*  ALBUMIN 4.5  AST 19  ALT 20  ALKPHOS 49  BILITOT 0.6   PT/INR  Recent Labs  06/05/16 1727  LABPROT 13.5  INR 1.01    Studies/Results: No results found.     Assessment / Plan:   Impression 1. Hematochezia: Started on Friday, 06/05/16, 8 episodes since then, no  preceding abdominal pain, positive CT angiogram, INR unable to embolize due to no bleeding at time of their angiogram, hemoglobin has decreased from 14-10.8 at time of admission, currently only dropped from 10.8-10.5 overnight and no further BM 2. Acute blood loss anemia: See above  Plan: 1. Will start full liquid diet 2. Continue to observe patient for further signs of acute bleeding with serial hemoglobin checks and transfusion as necessary less than 7 3. Continue supportive measures 4. If no further BM today, would recommend returning patient to low fiber/low residue diet tonight with plans for likely d/c tomorrow, if further signs of acute bleeding, would plan on prepping for colonoscopy today with plans to undergo tomorrow 5. Will discuss above with Dr. Hilarie Fredrickson, please await any further recs  Thank you for your kind consultation, we will continue to follow.     LOS: 3 days   Levin Erp  06/08/2016, 9:24 AM  Pager # [phone]

## 2016-06-09 LAB — BASIC METABOLIC PANEL
Anion gap: 5 (ref 5–15)
BUN: 7 mg/dL (ref 6–20)
CHLORIDE: 106 mmol/L (ref 101–111)
CO2: 29 mmol/L (ref 22–32)
Calcium: 8.9 mg/dL (ref 8.9–10.3)
Creatinine, Ser: 0.68 mg/dL (ref 0.44–1.00)
GFR calc Af Amer: >60 mL/min (ref >60)
GFR calc non Af Amer: >60 mL/min (ref >60)
Glucose, Bld: 105 mg/dL — ABNORMAL HIGH (ref 65–99)
POTASSIUM: 3.5 mmol/L (ref 3.5–5.1)
SODIUM: 140 mmol/L (ref 135–145)

## 2016-06-09 LAB — CBC
HEMATOCRIT: 34.3 % — AB (ref 36.0–46.0)
HEMOGLOBIN: 10.9 g/dL — AB (ref 12.0–15.0)
MCH: 22 pg — ABNORMAL LOW (ref 26.0–34.0)
MCHC: 31.8 g/dL (ref 30.0–36.0)
MCV: 69.2 fL — AB (ref 78.0–100.0)
Platelets: 307 10*3/uL (ref 150–400)
RBC: 4.96 MIL/uL (ref 3.87–5.11)
RDW: 17.9 % — AB (ref 11.5–15.5)
WBC: 9.3 10*3/uL (ref 4.0–10.5)

## 2016-06-09 LAB — GLUCOSE, CAPILLARY: Glucose-Capillary: 95 mg/dL (ref 65–99)

## 2016-06-09 MED ORDER — HYOSCYAMINE SULFATE 0.125 MG SL SUBL: 0.1250 mg | SUBLINGUAL_TABLET | Freq: Four times a day (QID) | SUBLINGUAL | 0 refills | Status: DC | PRN

## 2016-06-09 MED ORDER — HYOSCYAMINE SULFATE 0.125 MG SL SUBL
0.1250 mg | SUBLINGUAL_TABLET | Freq: Four times a day (QID) | SUBLINGUAL | Status: DC | PRN
  Filled 2016-06-09: qty 1

## 2016-06-09 MED ORDER — PANTOPRAZOLE SODIUM 40 MG PO TBEC: DELAYED_RELEASE_TABLET | ORAL | 1 refills | Status: DC

## 2016-06-09 NOTE — Discharge Instructions (Signed)

## 2016-06-09 NOTE — Progress Notes (Signed)
     [facility] Progress Note   CC: Diverticular bleed  Subjective: Today Mrs. Carol Mueller was expressing that she feels better, having 1 solid bowel movement this morning noting no blood. Unfortunately she continues to have discomfort in her left lower quadrant, but denies any other concerns. She has tolerated a soft diet and shows willingness to go home today.   Objective:  Vital signs in last 24 hours: Temp:  [97.8 F (36.6 C)-98.4 F (36.9 C)] 97.8 F (36.6 C) (07/25 0527) Pulse Rate:  [77-80] 80 (07/25 0527) Resp:  [17] 17 (07/25 0527) BP: (120-127)/(68-82) 127/78 (07/25 0527) SpO2:  [96 %-100 %] 96 % (07/25 0527) Last BM Date: 06/08/16 General:   Alert,  Well-developed,    in NAD Heart:  Regular rate and rhythm; no murmurs Pulm; Lungs clear to auscultation bilaterally Abdomen:  Soft, tender in the LLQ and nondistended. Normal bowel sounds, without guarding, and without rebound.   Extremities:  Without edema. Neurologic:  Alert and  oriented x4;  grossly normal neurologically. Psych:  Alert and cooperative. Normal mood and affect.  Intake/Output from previous day: 07/24 0701 - 07/25 0700 In: 71 [P.O.:240; I.V.:520; IV Piggyback:50] Out: -  Intake/Output this shift: Total I/O In: 360 [P.O.:360] Out: -   Lab Results:  Recent Labs  06/07/16 0531 06/08/16 0523 06/09/16 0521  WBC 8.5 8.4 9.3  HGB 10.8* 10.5* 10.9*  HCT 36.0 34.5* 34.3*  PLT 268 277 307   BMET  Recent Labs  06/07/16 0531 06/08/16 0523 06/09/16 0521  NA 139 140 140  K 3.7 3.7 3.5  CL 107 108 106  CO2 27 27 29   GLUCOSE 136* 133* 105*  BUN 14 8 7   CREATININE 0.71 0.61 0.68  CALCIUM 8.4* 8.7* 8.9   LFT No results for input(s): PROT, ALBUMIN, AST, ALT, ALKPHOS, BILITOT, BILIDIR, IBILI in the last 72 hours. PT/INR No results for input(s): LABPROT, INR in the last 72 hours. Hepatitis Panel No results for input(s): HEPBSAG, HCVAB, HEPAIGM, HEPBIGM in the last 72 hours.  No  results found.  Assessment / Plan: 1. Hematochezia: Started on Friday, 06/05/16, 8 episodes since then, no preceding abdominal pain, positive CT angiogram, IR unable to embolize due to no bleeding at time of their angiogram, hemoglobin at admission was 14 and dropped to 10.5 during stay. Today Hb is 10.9  with no diarrhea or hematochezia. Bleeding has resolved.  2. Acute blood loss anemia:See above. Did not receive any blood transfusions during stay.   Plan: 1. Patient can be discharged as bleeding has resolved. Continue soft diet for the next 3-4 days and follow with PCP in 2 weeks to monitor hemoglobin.  2. Have prescribed Levsin as needed for LLQ pain as this can be symptomatic diverticulosis. Educated on the risk of constipation.   3. Will discuss above with Dr. Hilarie Fredrickson, please await any further recs    LOS: 4 days   Carol Ormond PA-S  06/09/2016, 10:27 AM  Pager number [phone]

## 2016-06-09 NOTE — Discharge Summary (Signed)
Physician Discharge Summary  ANABELLE FREIERMUTH [id number] DOB: [birth year] DOA: 06/05/2016  PCP: Delman Cheadle, MD  Admit date: 06/05/2016 Discharge date: 06/09/2016  Recommendations for Outpatient Follow-up:  1. Continue current home meds 2. F/U with PCP in 2 weeks to make sure hemoglobin stable   Discharge Diagnoses:  Principal Problem:   Diverticulosis of colon with hemorrhage Active Problems:   Obesity   Hypertension   Type II diabetes mellitus, uncontrolled ([place])   Hematochezia    Discharge Condition: stable   Diet recommendation: as tolerated   History of present illness:  55 y.o. female with past medical history significant for diabetes mellitus, HTN and polycystic ovary disease who presented to [facility] with rectal bleed with and without bowel movement. She underwent  CT angiography which showed active bleeding but when underwent visceral angiogram no active bleed detected. She had bleeding with BM 7/22 and then seen by GI in consultation.   Hospital Course:   Assessment & Plan:   Principal Problem:   Diverticulosis of colon with hemorrhage / Lower GI bleed / Acute blood loss anemia  - Lower GI bleed secondary to a diverticular bleed - Noted active bleeding on CTA but when underwent visceral angiogram no active bleed detected - She had 2 episodes of blood with BM 7/22 and hemoglobin dropped from 12.7 to 10.8 - GI consulted and recommended observation, low residue diet  - No further bleed - Hgb stable, 10.9 this am - May continue protonix on discharge     Essential hypertension - Continue carvedilol and losartan  - Continue hctz     Diabetes mellitus without complications with long term insulin use - Continue home regimen insulin     DVT prophylaxis: SCD's bilaterally  Code Status: full code  Family Communication: no family at the bedside this am   Consultants:   IR  GI  Procedures:  Angiogram 7/21  Antimicrobials:  None      Signed:  Leisa Lenz, MD  [facility] 06/09/2016, 10:14 AM  Pager #: [phone]  Time spent in minutes: less than 30 minutes   Discharge Exam: Vitals:   06/08/16 2200 06/09/16 0527  BP: 125/68 127/78  Pulse: 77 80  Resp: 17 17  Temp: 97.9 F (36.6 C) 97.8 F (36.6 C)   Vitals:   06/08/16 0551 06/08/16 1408 06/08/16 2200 06/09/16 0527  BP: 107/72 120/82 125/68 127/78  Pulse: 77 79 77 80  Resp:   17 17  Temp: 98.6 F (37 C) 98.4 F (36.9 C) 97.9 F (36.6 C) 97.8 F (36.6 C)  TempSrc: Oral Oral Oral Oral  SpO2: 94% 99% 100% 96%  Weight:      Height:        General: Pt is alert, follows commands appropriately, not in acute distress Cardiovascular: Regular rate and rhythm, S1/S2 +, no murmurs Respiratory: Clear to auscultation bilaterally, no wheezing, no crackles, no rhonchi Abdominal: Soft, non tender, non distended, bowel sounds +, no guarding Extremities: no edema, no cyanosis, pulses palpable bilaterally DP and PT Neuro: Grossly nonfocal  Discharge Instructions  Discharge Instructions    Call MD for:  persistant nausea and vomiting    Complete by:  As directed   Call MD for:  severe uncontrolled pain    Complete by:  As directed   Diet - low sodium heart healthy    Complete by:  As directed   Increase activity slowly    Complete by:  As directed       Medication List  STOP taking these medications   ipratropium 0.03 % nasal spray Commonly known as:  ATROVENT   simethicone 80 MG chewable tablet Commonly known as:  MYLICON     TAKE these medications   ADVANCED COLLAGEN PO Take 1 scoop by mouth daily.   carvedilol 3.125 MG tablet Commonly known as:  COREG Take 3.125 mg by mouth 2 (two) times daily with a meal.   hyoscyamine 0.125 MG SL tablet Commonly known as:  LEVSIN SL Place 1 tablet (0.125 mg total) under the tongue every 6 (six) hours as needed for cramping.   Insulin Glargine 100 UNIT/ML Solostar Pen Commonly known  as:  LANTUS SOLOSTAR Inject 20 to 40 units into the skin HS. Increase dose by 2 units for readings above 140. PATIENT NEEDS OFFICE VISIT FOR ADDITIONAL REFILLS   Insulin Pen Needle 31G X 8 MM Misc Commonly known as:  B-D ULTRAFINE III SHORT PEN Use to inject insulin every night. PATIENT NEEDS OFFICE VISIT FOR ADDITIONAL REFILLS   INVOKANA 100 MG Tabs tablet Generic drug:  canagliflozin Take 100 mg by mouth daily.   losartan-hydrochlorothiazide 100-25 MG tablet Commonly known as:  HYZAAR TAKE 1 TABLET BY MOUTH DAILY. NEEDS OFFICE VISIT   metFORMIN 1000 MG tablet Commonly known as:  GLUCOPHAGE Take 1 tablet (1,000 mg total) by mouth 2 (two) times daily with a meal. PATIENT NEEDS OFFICE VISIT FOR ADDITIONAL REFILLS   multivitamin with minerals tablet Take 1 tablet by mouth daily.   Omega 3 1000 MG Caps Take 1,000 mg by mouth daily.   pantoprazole 40 MG tablet Commonly known as:  PROTONIX TAKE 1 TABLET (40 MG TOTAL) BY MOUTH DAILY. What changed:  See the new instructions.      Follow-up Information    SHAW,EVA, MD. Schedule an appointment as soon as possible for a visit in 2 week(s).   Specialty:  Family Medicine Contact information: [place] [id number] [phone]            The results of significant diagnostics from this hospitalization (including imaging, microbiology, ancillary and laboratory) are listed below for reference.    Significant Diagnostic Studies: Ir Angiogram Visceral Selective  Result Date: 06/05/2016 INDICATION: 55 year old female with bloody stools and evidence of active splenic flexure diverticular bleed on CTA. She presents for mesenteric arteriogram and possible embolization if the bleeding vessel can be localized. EXAM: SELECTIVE VISCERAL ARTERIOGRAPHY; ADDITIONAL ARTERIOGRAPHY; IR ULTRASOUND GUIDANCE VASC ACCESS RIGHT Interventional Radiologist:  Criselda Peaches, MD MEDICATIONS: None ANESTHESIA/SEDATION: Fentanyl 50 mcg  IV; Versed 3.5 mg IV Moderate Sedation Time:  36 minutes The patient was continuously monitored during the procedure by the interventional radiology nurse under my direct supervision. CONTRAST:  109 mL Isovue-300 FLUOROSCOPY TIME:  Fluoroscopy Time: 8 minutes 12 seconds (2150 mGy). COMPLICATIONS: None immediate. PROCEDURE: Informed consent was obtained from the patient following explanation of the procedure, risks, benefits and alternatives. The patient understands, agrees and consents for the procedure. All questions were addressed. A time out was performed. Maximal barrier sterile technique utilized including caps, mask, sterile gowns, sterile gloves, large sterile drape, hand hygiene, and Betadine skin prep. The right common femoral artery was interrogated with ultrasound and found to be widely patent. An image was obtained and stored for the medical record. Local anesthesia was attained by infiltration with 1% lidocaine. A small dermatotomy was made. Under real-time sonographic guidance, the vessel was punctured with a 21 gauge micropuncture needle. Using standard technique, the initial micro needle was exchanged over  a 0.018 micro wire for a transitional 4 [place] micro sheath. The micro sheath was then exchanged over a 0.035 wire for a working 5 [place] vascular sheath. A rim catheter was advanced over a Bentson wire in used to select the inferior mesenteric artery. In IMA arteriogram was performed. Vascular anatomy is excellent and not particularly tortuous. No evidence of active extravasation of contrast. No evidence of angiodysplasia, arteriovenous malformation or other vascular abnormality. Anatomy is conventional. A renegade STC micro catheter was then advanced in used to select the left colloid artery. A left folic arteriogram was performed. No evidence of active extravasation or vascular abnormality. The branch vessels supplying the segment of colon identified with the active bleed on the recent CTA are  visible. The renegade STC was advanced into the more superior of the 2 branch vessels. Arteriography was performed. Again, no evidence of active bleeding or vascular abnormality. The micro catheter was then brought back in advanced into the more inferior of the 2 branch vessels. Arteriography was performed still with no evidence of active bleeding or vascular abnormality. The micro catheter and rim catheter were removed. A C2 cobra catheter was introduced over the Bentson wire in used to select the superior mesenteric artery. Arteriography was performed. The C2 cobra catheter is a deep within the SMA. The middle colloid artery does not opacified. There is no evidence of extravasation or abnormality in the visualized vessels. The cobra catheter was retracted slightly and arteriography repeated. The middle colloid artery can be identified. There is a suggestion of abnormality of 1 of the distal branches. Therefore, the renegade STC micro catheter was used to select the middle colloid artery an additional arteriography was performed. No evidence of active bleeding or vascular abnormality. The micro catheter was removed. As the cobra catheter was being removed, a was used to select the IMA 1 last time. A final arteriogram demonstrated no evidence of active hemorrhage. The catheter was removed. Hemostasis was attained with the assistance of a Cordis Exoseal extra arterial vascular plug after common femoral access was confirmed by a limited right common femoral arteriogram. IMPRESSION: No evidence of active bleeding or vascular abnormality at this time. Diverticular bleed seen on the recent CT arteriogram is no longer bleeding. Signed, Criselda Peaches, MD [facility] Radiology Electronically Signed   By: Jacqulynn Cadet M.D.   On: 06/05/2016 16:27   Ir Angiogram Visceral Selective  Result Date: 06/05/2016 INDICATION: 55 year old female with bloody stools and  evidence of active splenic flexure diverticular bleed on CTA. She presents for mesenteric arteriogram and possible embolization if the bleeding vessel can be localized. EXAM: SELECTIVE VISCERAL ARTERIOGRAPHY; ADDITIONAL ARTERIOGRAPHY; IR ULTRASOUND GUIDANCE VASC ACCESS RIGHT Interventional Radiologist:  Criselda Peaches, MD MEDICATIONS: None ANESTHESIA/SEDATION: Fentanyl 50 mcg IV; Versed 3.5 mg IV Moderate Sedation Time:  36 minutes The patient was continuously monitored during the procedure by the interventional radiology nurse under my direct supervision. CONTRAST:  109 mL Isovue-300 FLUOROSCOPY TIME:  Fluoroscopy Time: 8 minutes 12 seconds (2150 mGy). COMPLICATIONS: None immediate. PROCEDURE: Informed consent was obtained from the patient following explanation of the procedure, risks, benefits and alternatives. The patient understands, agrees and consents for the procedure. All questions were addressed. A time out was performed. Maximal barrier sterile technique utilized including caps, mask, sterile gowns, sterile gloves, large sterile drape, hand hygiene, and Betadine skin prep. The right common femoral artery was interrogated with ultrasound and found to be widely patent. An image was obtained and  stored for the medical record. Local anesthesia was attained by infiltration with 1% lidocaine. A small dermatotomy was made. Under real-time sonographic guidance, the vessel was punctured with a 21 gauge micropuncture needle. Using standard technique, the initial micro needle was exchanged over a 0.018 micro wire for a transitional 4 [place] micro sheath. The micro sheath was then exchanged over a 0.035 wire for a working 5 [place] vascular sheath. A rim catheter was advanced over a Bentson wire in used to select the inferior mesenteric artery. In IMA arteriogram was performed. Vascular anatomy is excellent and not particularly tortuous. No evidence of active extravasation of contrast. No evidence of  angiodysplasia, arteriovenous malformation or other vascular abnormality. Anatomy is conventional. A renegade STC micro catheter was then advanced in used to select the left colloid artery. A left folic arteriogram was performed. No evidence of active extravasation or vascular abnormality. The branch vessels supplying the segment of colon identified with the active bleed on the recent CTA are visible. The renegade STC was advanced into the more superior of the 2 branch vessels. Arteriography was performed. Again, no evidence of active bleeding or vascular abnormality. The micro catheter was then brought back in advanced into the more inferior of the 2 branch vessels. Arteriography was performed still with no evidence of active bleeding or vascular abnormality. The micro catheter and rim catheter were removed. A C2 cobra catheter was introduced over the Bentson wire in used to select the superior mesenteric artery. Arteriography was performed. The C2 cobra catheter is a deep within the SMA. The middle colloid artery does not opacified. There is no evidence of extravasation or abnormality in the visualized vessels. The cobra catheter was retracted slightly and arteriography repeated. The middle colloid artery can be identified. There is a suggestion of abnormality of 1 of the distal branches. Therefore, the renegade STC micro catheter was used to select the middle colloid artery an additional arteriography was performed. No evidence of active bleeding or vascular abnormality. The micro catheter was removed. As the cobra catheter was being removed, a was used to select the IMA 1 last time. A final arteriogram demonstrated no evidence of active hemorrhage. The catheter was removed. Hemostasis was attained with the assistance of a Cordis Exoseal extra arterial vascular plug after common femoral access was confirmed by a limited right common femoral arteriogram. IMPRESSION: No evidence of active bleeding or vascular  abnormality at this time. Diverticular bleed seen on the recent CT arteriogram is no longer bleeding. Signed, Criselda Peaches, MD [facility] Radiology Electronically Signed   By: Jacqulynn Cadet M.D.   On: 06/05/2016 16:27   Ir Angiogram Selective Each Additional Vessel  Result Date: 06/05/2016 INDICATION: 55 year old female with bloody stools and evidence of active splenic flexure diverticular bleed on CTA. She presents for mesenteric arteriogram and possible embolization if the bleeding vessel can be localized. EXAM: SELECTIVE VISCERAL ARTERIOGRAPHY; ADDITIONAL ARTERIOGRAPHY; IR ULTRASOUND GUIDANCE VASC ACCESS RIGHT Interventional Radiologist:  Criselda Peaches, MD MEDICATIONS: None ANESTHESIA/SEDATION: Fentanyl 50 mcg IV; Versed 3.5 mg IV Moderate Sedation Time:  36 minutes The patient was continuously monitored during the procedure by the interventional radiology nurse under my direct supervision. CONTRAST:  109 mL Isovue-300 FLUOROSCOPY TIME:  Fluoroscopy Time: 8 minutes 12 seconds (2150 mGy). COMPLICATIONS: None immediate. PROCEDURE: Informed consent was obtained from the patient following explanation of the procedure, risks, benefits and alternatives. The patient understands, agrees and consents for the procedure. All questions were addressed. A  time out was performed. Maximal barrier sterile technique utilized including caps, mask, sterile gowns, sterile gloves, large sterile drape, hand hygiene, and Betadine skin prep. The right common femoral artery was interrogated with ultrasound and found to be widely patent. An image was obtained and stored for the medical record. Local anesthesia was attained by infiltration with 1% lidocaine. A small dermatotomy was made. Under real-time sonographic guidance, the vessel was punctured with a 21 gauge micropuncture needle. Using standard technique, the initial micro needle was exchanged over a 0.018 micro  wire for a transitional 4 [place] micro sheath. The micro sheath was then exchanged over a 0.035 wire for a working 5 [place] vascular sheath. A rim catheter was advanced over a Bentson wire in used to select the inferior mesenteric artery. In IMA arteriogram was performed. Vascular anatomy is excellent and not particularly tortuous. No evidence of active extravasation of contrast. No evidence of angiodysplasia, arteriovenous malformation or other vascular abnormality. Anatomy is conventional. A renegade STC micro catheter was then advanced in used to select the left colloid artery. A left folic arteriogram was performed. No evidence of active extravasation or vascular abnormality. The branch vessels supplying the segment of colon identified with the active bleed on the recent CTA are visible. The renegade STC was advanced into the more superior of the 2 branch vessels. Arteriography was performed. Again, no evidence of active bleeding or vascular abnormality. The micro catheter was then brought back in advanced into the more inferior of the 2 branch vessels. Arteriography was performed still with no evidence of active bleeding or vascular abnormality. The micro catheter and rim catheter were removed. A C2 cobra catheter was introduced over the Bentson wire in used to select the superior mesenteric artery. Arteriography was performed. The C2 cobra catheter is a deep within the SMA. The middle colloid artery does not opacified. There is no evidence of extravasation or abnormality in the visualized vessels. The cobra catheter was retracted slightly and arteriography repeated. The middle colloid artery can be identified. There is a suggestion of abnormality of 1 of the distal branches. Therefore, the renegade STC micro catheter was used to select the middle colloid artery an additional arteriography was performed. No evidence of active bleeding or vascular abnormality. The micro catheter was removed. As the cobra  catheter was being removed, a was used to select the IMA 1 last time. A final arteriogram demonstrated no evidence of active hemorrhage. The catheter was removed. Hemostasis was attained with the assistance of a Cordis Exoseal extra arterial vascular plug after common femoral access was confirmed by a limited right common femoral arteriogram. IMPRESSION: No evidence of active bleeding or vascular abnormality at this time. Diverticular bleed seen on the recent CT arteriogram is no longer bleeding. Signed, Criselda Peaches, MD [facility] Radiology Electronically Signed   By: Jacqulynn Cadet M.D.   On: 06/05/2016 16:27   Ir Angiogram Selective Each Additional Vessel  Result Date: 06/05/2016 INDICATION: 55 year old female with bloody stools and evidence of active splenic flexure diverticular bleed on CTA. She presents for mesenteric arteriogram and possible embolization if the bleeding vessel can be localized. EXAM: SELECTIVE VISCERAL ARTERIOGRAPHY; ADDITIONAL ARTERIOGRAPHY; IR ULTRASOUND GUIDANCE VASC ACCESS RIGHT Interventional Radiologist:  Criselda Peaches, MD MEDICATIONS: None ANESTHESIA/SEDATION: Fentanyl 50 mcg IV; Versed 3.5 mg IV Moderate Sedation Time:  36 minutes The patient was continuously monitored during the procedure by the interventional radiology nurse under my direct supervision. CONTRAST:  109 mL Isovue-300  FLUOROSCOPY TIME:  Fluoroscopy Time: 8 minutes 12 seconds (2150 mGy). COMPLICATIONS: None immediate. PROCEDURE: Informed consent was obtained from the patient following explanation of the procedure, risks, benefits and alternatives. The patient understands, agrees and consents for the procedure. All questions were addressed. A time out was performed. Maximal barrier sterile technique utilized including caps, mask, sterile gowns, sterile gloves, large sterile drape, hand hygiene, and Betadine skin prep. The right common femoral artery  was interrogated with ultrasound and found to be widely patent. An image was obtained and stored for the medical record. Local anesthesia was attained by infiltration with 1% lidocaine. A small dermatotomy was made. Under real-time sonographic guidance, the vessel was punctured with a 21 gauge micropuncture needle. Using standard technique, the initial micro needle was exchanged over a 0.018 micro wire for a transitional 4 [place] micro sheath. The micro sheath was then exchanged over a 0.035 wire for a working 5 [place] vascular sheath. A rim catheter was advanced over a Bentson wire in used to select the inferior mesenteric artery. In IMA arteriogram was performed. Vascular anatomy is excellent and not particularly tortuous. No evidence of active extravasation of contrast. No evidence of angiodysplasia, arteriovenous malformation or other vascular abnormality. Anatomy is conventional. A renegade STC micro catheter was then advanced in used to select the left colloid artery. A left folic arteriogram was performed. No evidence of active extravasation or vascular abnormality. The branch vessels supplying the segment of colon identified with the active bleed on the recent CTA are visible. The renegade STC was advanced into the more superior of the 2 branch vessels. Arteriography was performed. Again, no evidence of active bleeding or vascular abnormality. The micro catheter was then brought back in advanced into the more inferior of the 2 branch vessels. Arteriography was performed still with no evidence of active bleeding or vascular abnormality. The micro catheter and rim catheter were removed. A C2 cobra catheter was introduced over the Bentson wire in used to select the superior mesenteric artery. Arteriography was performed. The C2 cobra catheter is a deep within the SMA. The middle colloid artery does not opacified. There is no evidence of extravasation or abnormality in the visualized vessels. The cobra catheter  was retracted slightly and arteriography repeated. The middle colloid artery can be identified. There is a suggestion of abnormality of 1 of the distal branches. Therefore, the renegade STC micro catheter was used to select the middle colloid artery an additional arteriography was performed. No evidence of active bleeding or vascular abnormality. The micro catheter was removed. As the cobra catheter was being removed, a was used to select the IMA 1 last time. A final arteriogram demonstrated no evidence of active hemorrhage. The catheter was removed. Hemostasis was attained with the assistance of a Cordis Exoseal extra arterial vascular plug after common femoral access was confirmed by a limited right common femoral arteriogram. IMPRESSION: No evidence of active bleeding or vascular abnormality at this time. Diverticular bleed seen on the recent CT arteriogram is no longer bleeding. Signed, Criselda Peaches, MD [facility] Radiology Electronically Signed   By: Jacqulynn Cadet M.D.   On: 06/05/2016 16:27   Ir Angiogram Selective Each Additional Vessel  Result Date: 06/05/2016 INDICATION: 55 year old female with bloody stools and evidence of active splenic flexure diverticular bleed on CTA. She presents for mesenteric arteriogram and possible embolization if the bleeding vessel can be localized. EXAM: SELECTIVE VISCERAL ARTERIOGRAPHY; ADDITIONAL ARTERIOGRAPHY; IR ULTRASOUND GUIDANCE VASC ACCESS RIGHT Interventional  Radiologist:  Criselda Peaches, MD MEDICATIONS: None ANESTHESIA/SEDATION: Fentanyl 50 mcg IV; Versed 3.5 mg IV Moderate Sedation Time:  36 minutes The patient was continuously monitored during the procedure by the interventional radiology nurse under my direct supervision. CONTRAST:  109 mL Isovue-300 FLUOROSCOPY TIME:  Fluoroscopy Time: 8 minutes 12 seconds (2150 mGy). COMPLICATIONS: None immediate. PROCEDURE: Informed consent was obtained from  the patient following explanation of the procedure, risks, benefits and alternatives. The patient understands, agrees and consents for the procedure. All questions were addressed. A time out was performed. Maximal barrier sterile technique utilized including caps, mask, sterile gowns, sterile gloves, large sterile drape, hand hygiene, and Betadine skin prep. The right common femoral artery was interrogated with ultrasound and found to be widely patent. An image was obtained and stored for the medical record. Local anesthesia was attained by infiltration with 1% lidocaine. A small dermatotomy was made. Under real-time sonographic guidance, the vessel was punctured with a 21 gauge micropuncture needle. Using standard technique, the initial micro needle was exchanged over a 0.018 micro wire for a transitional 4 [place] micro sheath. The micro sheath was then exchanged over a 0.035 wire for a working 5 [place] vascular sheath. A rim catheter was advanced over a Bentson wire in used to select the inferior mesenteric artery. In IMA arteriogram was performed. Vascular anatomy is excellent and not particularly tortuous. No evidence of active extravasation of contrast. No evidence of angiodysplasia, arteriovenous malformation or other vascular abnormality. Anatomy is conventional. A renegade STC micro catheter was then advanced in used to select the left colloid artery. A left folic arteriogram was performed. No evidence of active extravasation or vascular abnormality. The branch vessels supplying the segment of colon identified with the active bleed on the recent CTA are visible. The renegade STC was advanced into the more superior of the 2 branch vessels. Arteriography was performed. Again, no evidence of active bleeding or vascular abnormality. The micro catheter was then brought back in advanced into the more inferior of the 2 branch vessels. Arteriography was performed still with no evidence of active bleeding or vascular  abnormality. The micro catheter and rim catheter were removed. A C2 cobra catheter was introduced over the Bentson wire in used to select the superior mesenteric artery. Arteriography was performed. The C2 cobra catheter is a deep within the SMA. The middle colloid artery does not opacified. There is no evidence of extravasation or abnormality in the visualized vessels. The cobra catheter was retracted slightly and arteriography repeated. The middle colloid artery can be identified. There is a suggestion of abnormality of 1 of the distal branches. Therefore, the renegade STC micro catheter was used to select the middle colloid artery an additional arteriography was performed. No evidence of active bleeding or vascular abnormality. The micro catheter was removed. As the cobra catheter was being removed, a was used to select the IMA 1 last time. A final arteriogram demonstrated no evidence of active hemorrhage. The catheter was removed. Hemostasis was attained with the assistance of a Cordis Exoseal extra arterial vascular plug after common femoral access was confirmed by a limited right common femoral arteriogram. IMPRESSION: No evidence of active bleeding or vascular abnormality at this time. Diverticular bleed seen on the recent CT arteriogram is no longer bleeding. Signed, Criselda Peaches, MD [facility] Radiology Electronically Signed   By: Jacqulynn Cadet M.D.   On: 06/05/2016 16:27   Ir Angiogram Selective Each Additional Vessel  Result Date:  06/05/2016 INDICATION: 55 year old female with bloody stools and evidence of active splenic flexure diverticular bleed on CTA. She presents for mesenteric arteriogram and possible embolization if the bleeding vessel can be localized. EXAM: SELECTIVE VISCERAL ARTERIOGRAPHY; ADDITIONAL ARTERIOGRAPHY; IR ULTRASOUND GUIDANCE VASC ACCESS RIGHT Interventional Radiologist:  Criselda Peaches, MD MEDICATIONS: None  ANESTHESIA/SEDATION: Fentanyl 50 mcg IV; Versed 3.5 mg IV Moderate Sedation Time:  36 minutes The patient was continuously monitored during the procedure by the interventional radiology nurse under my direct supervision. CONTRAST:  109 mL Isovue-300 FLUOROSCOPY TIME:  Fluoroscopy Time: 8 minutes 12 seconds (2150 mGy). COMPLICATIONS: None immediate. PROCEDURE: Informed consent was obtained from the patient following explanation of the procedure, risks, benefits and alternatives. The patient understands, agrees and consents for the procedure. All questions were addressed. A time out was performed. Maximal barrier sterile technique utilized including caps, mask, sterile gowns, sterile gloves, large sterile drape, hand hygiene, and Betadine skin prep. The right common femoral artery was interrogated with ultrasound and found to be widely patent. An image was obtained and stored for the medical record. Local anesthesia was attained by infiltration with 1% lidocaine. A small dermatotomy was made. Under real-time sonographic guidance, the vessel was punctured with a 21 gauge micropuncture needle. Using standard technique, the initial micro needle was exchanged over a 0.018 micro wire for a transitional 4 [place] micro sheath. The micro sheath was then exchanged over a 0.035 wire for a working 5 [place] vascular sheath. A rim catheter was advanced over a Bentson wire in used to select the inferior mesenteric artery. In IMA arteriogram was performed. Vascular anatomy is excellent and not particularly tortuous. No evidence of active extravasation of contrast. No evidence of angiodysplasia, arteriovenous malformation or other vascular abnormality. Anatomy is conventional. A renegade STC micro catheter was then advanced in used to select the left colloid artery. A left folic arteriogram was performed. No evidence of active extravasation or vascular abnormality. The branch vessels supplying the segment of colon identified with the  active bleed on the recent CTA are visible. The renegade STC was advanced into the more superior of the 2 branch vessels. Arteriography was performed. Again, no evidence of active bleeding or vascular abnormality. The micro catheter was then brought back in advanced into the more inferior of the 2 branch vessels. Arteriography was performed still with no evidence of active bleeding or vascular abnormality. The micro catheter and rim catheter were removed. A C2 cobra catheter was introduced over the Bentson wire in used to select the superior mesenteric artery. Arteriography was performed. The C2 cobra catheter is a deep within the SMA. The middle colloid artery does not opacified. There is no evidence of extravasation or abnormality in the visualized vessels. The cobra catheter was retracted slightly and arteriography repeated. The middle colloid artery can be identified. There is a suggestion of abnormality of 1 of the distal branches. Therefore, the renegade STC micro catheter was used to select the middle colloid artery an additional arteriography was performed. No evidence of active bleeding or vascular abnormality. The micro catheter was removed. As the cobra catheter was being removed, a was used to select the IMA 1 last time. A final arteriogram demonstrated no evidence of active hemorrhage. The catheter was removed. Hemostasis was attained with the assistance of a Cordis Exoseal extra arterial vascular plug after common femoral access was confirmed by a limited right common femoral arteriogram. IMPRESSION: No evidence of active bleeding or vascular abnormality at this time. Diverticular bleed seen on  the recent CT arteriogram is no longer bleeding. Signed, Criselda Peaches, MD [facility] Radiology Electronically Signed   By: Jacqulynn Cadet M.D.   On: 06/05/2016 16:27   Ir US Guide Vasc Access Right  Result Date: 06/05/2016 INDICATION: 55 year old  female with bloody stools and evidence of active splenic flexure diverticular bleed on CTA. She presents for mesenteric arteriogram and possible embolization if the bleeding vessel can be localized. EXAM: SELECTIVE VISCERAL ARTERIOGRAPHY; ADDITIONAL ARTERIOGRAPHY; IR ULTRASOUND GUIDANCE VASC ACCESS RIGHT Interventional Radiologist:  Criselda Peaches, MD MEDICATIONS: None ANESTHESIA/SEDATION: Fentanyl 50 mcg IV; Versed 3.5 mg IV Moderate Sedation Time:  36 minutes The patient was continuously monitored during the procedure by the interventional radiology nurse under my direct supervision. CONTRAST:  109 mL Isovue-300 FLUOROSCOPY TIME:  Fluoroscopy Time: 8 minutes 12 seconds (2150 mGy). COMPLICATIONS: None immediate. PROCEDURE: Informed consent was obtained from the patient following explanation of the procedure, risks, benefits and alternatives. The patient understands, agrees and consents for the procedure. All questions were addressed. A time out was performed. Maximal barrier sterile technique utilized including caps, mask, sterile gowns, sterile gloves, large sterile drape, hand hygiene, and Betadine skin prep. The right common femoral artery was interrogated with ultrasound and found to be widely patent. An image was obtained and stored for the medical record. Local anesthesia was attained by infiltration with 1% lidocaine. A small dermatotomy was made. Under real-time sonographic guidance, the vessel was punctured with a 21 gauge micropuncture needle. Using standard technique, the initial micro needle was exchanged over a 0.018 micro wire for a transitional 4 [place] micro sheath. The micro sheath was then exchanged over a 0.035 wire for a working 5 [place] vascular sheath. A rim catheter was advanced over a Bentson wire in used to select the inferior mesenteric artery. In IMA arteriogram was performed. Vascular anatomy is excellent and not particularly tortuous. No evidence of active extravasation of  contrast. No evidence of angiodysplasia, arteriovenous malformation or other vascular abnormality. Anatomy is conventional. A renegade STC micro catheter was then advanced in used to select the left colloid artery. A left folic arteriogram was performed. No evidence of active extravasation or vascular abnormality. The branch vessels supplying the segment of colon identified with the active bleed on the recent CTA are visible. The renegade STC was advanced into the more superior of the 2 branch vessels. Arteriography was performed. Again, no evidence of active bleeding or vascular abnormality. The micro catheter was then brought back in advanced into the more inferior of the 2 branch vessels. Arteriography was performed still with no evidence of active bleeding or vascular abnormality. The micro catheter and rim catheter were removed. A C2 cobra catheter was introduced over the Bentson wire in used to select the superior mesenteric artery. Arteriography was performed. The C2 cobra catheter is a deep within the SMA. The middle colloid artery does not opacified. There is no evidence of extravasation or abnormality in the visualized vessels. The cobra catheter was retracted slightly and arteriography repeated. The middle colloid artery can be identified. There is a suggestion of abnormality of 1 of the distal branches. Therefore, the renegade STC micro catheter was used to select the middle colloid artery an additional arteriography was performed. No evidence of active bleeding or vascular abnormality. The micro catheter was removed. As the cobra catheter was being removed, a was used to select the IMA 1 last time. A final arteriogram demonstrated no evidence of active hemorrhage. The catheter  was removed. Hemostasis was attained with the assistance of a Cordis Exoseal extra arterial vascular plug after common femoral access was confirmed by a limited right common femoral arteriogram. IMPRESSION: No evidence of active  bleeding or vascular abnormality at this time. Diverticular bleed seen on the recent CT arteriogram is no longer bleeding. Signed, Criselda Peaches, MD [facility] Radiology Electronically Signed   By: Jacqulynn Cadet M.D.   On: 06/05/2016 16:27   Ct Cta Abd/pel W/cm &/or W/o Cm  Result Date: 06/05/2016 CLINICAL DATA:  55 year old female with central lower abdominal pain since earlier this morning and bloody stools. EXAM: CTA ABDOMEN AND PELVIS wITHOUT AND WITH CONTRAST TECHNIQUE: Multidetector CT imaging of the abdomen and pelvis was performed using the standard protocol during bolus administration of intravenous contrast. Multiplanar reconstructed images and MIPs were obtained and reviewed to evaluate the vascular anatomy. CONTRAST:  100 mL Isovue 370 COMPARISON:  CT abdomen/ pelvis 07/15/2015 FINDINGS: VASCULAR Aorta: Normal caliber abdominal aorta. No evidence of aneurysm, dissection or significant atherosclerotic plaque. Celiac: Widely patent and unremarkable. Incidentally, left hepatic artery is replaced to the left gastric artery. SMA: The widely patent unremarkable. Renals: Single dominant renal arteries which are widely patent. No changes of fibromuscular dysplasia. Additionally, there is a small accessory renal artery to the right lower pole. IMA: Patent and unremarkable. Inflow: Patent and unremarkable. Proximal Outflow: Paid unremarkable. Veins: No focal venous abnormality. NON-VASCULAR Lower Chest: The lung bases are clear. Visualized cardiac structures are within normal limits for size. No pericardial effusion. Unremarkable visualized distal thoracic esophagus. Abdomen: Unremarkable CT appearance of the stomach, duodenum, spleen, left adrenal gland and pancreas. The right adrenal gland is surgically absent. The liver is diffusely low in attenuation consistent with at least at moderate hepatic steatosis. The gallbladder is surgically absent. No  intra or extrahepatic biliary ductal dilatation. 2.3 cm circumscribed water attenuation cyst in the interpolar right kidney consistent with a simple cyst. There is a second smaller 1.1 cm cyst also in the interpolar right kidney. Similarly, there is a simple cyst in the lower pole of the left kidney measuring 1.7 cm. No hydronephrosis, nephrolithiasis or enhancing renal lesion. Extensive colonic diverticulosis predominantly involving the descending and sigmoid colon. High attenuation material is present within a diverticulum along the mesenteric border of the proximal descending colon in the region of the splenic flexure (image 54 series 4) high attenuation material can be seen within the adjacent colonic lumen as well. On the venous phase images this material has dispersed consistent with contrast extravasation. No findings to suggest acute diverticulitis. Diverticulosis also involves the cecum and ascending colon. Normal appendix in the right lower quadrant. No small bowel wall thickening or evidence of obstruction. No free fluid or suspicious adenopathy. Pelvis: Large multi fibroid uterus with extensive dystrophic calcifications. The bladder is unremarkable. Unremarkable appearance of the adnexa. No free fluid or suspicious adenopathy. Bones/Soft Tissues: No acute osseous abnormality. Review of the MIP images confirms the above findings. IMPRESSION: VASCULAR 1. Active diverticular bleed in the proximal descending colon just beyond the splenic flexure. 2. No significant atherosclerotic vascular disease. NON VASCULAR 1. Extensive colonic diverticulosis. No evidence of active diverticulitis. 2. At least moderate hepatic steatosis. 3. Simple renal cysts noted incidentally. 4. Fibroid uterus. 5. 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 These results were called by telephone at the time of interpretation on 06/05/2016 at 2:19 pm to Dr. Jeannett Senior , who verbally acknowledged these  results. Signed, Myrle Sheng  K. Laurence Ferrari, MD [facility] Radiology Electronically Signed   By: Jacqulynn Cadet M.D.   On: 06/05/2016 14:49    Microbiology: No results found for this or any previous visit (from the past 240 hour(s)).   Labs: Basic Metabolic Panel:  Recent Labs Lab 06/05/16 1147 06/06/16 0550 06/07/16 0531 06/08/16 0523 06/09/16 0521  NA 138 137 139 140 140  K 3.7 3.6 3.7 3.7 3.5  CL 101 101 107 108 106  CO2 28 30 27 27 29   GLUCOSE 131* 108* 136* 133* 105*  BUN 23* 15 14 8 7   CREATININE 0.84 0.79 0.71 0.61 0.68  CALCIUM 9.3 8.9 8.4* 8.7* 8.9   Liver Function Tests:  Recent Labs Lab 06/05/16 1147  AST 19  ALT 20  ALKPHOS 49  BILITOT 0.6  PROT 8.3*  ALBUMIN 4.5   No results for input(s): LIPASE, AMYLASE in the last 168 hours. No results for input(s): AMMONIA in the last 168 hours. CBC:  Recent Labs Lab 06/05/16 1147 06/06/16 0550 06/07/16 0531 06/08/16 0523 06/09/16 0521  WBC 9.8 9.8 8.5 8.4 9.3  HGB 14.0 12.7 10.8* 10.5* 10.9*  HCT 45.2 40.3 36.0 34.5* 34.3*  MCV 70.1* 68.7* 70.9* 70.7* 69.2*  PLT 344 298 268 277 307   Cardiac Enzymes: No results for input(s): CKTOTAL, CKMB, CKMBINDEX, TROPONINI in the last 168 hours. BNP: BNP (last 3 results) No results for input(s): BNP in the last 8760 hours.  ProBNP (last 3 results) No results for input(s): PROBNP in the last 8760 hours.  CBG:  Recent Labs Lab 06/08/16 0739 06/08/16 1155 06/08/16 1700 06/08/16 2202 06/09/16 0741  GLUCAP 128* 109* 123* 108* 95

## 2016-08-19 ENCOUNTER — Other Ambulatory Visit: Payer: Self-pay | Admitting: Family Medicine

## 2016-08-19 DIAGNOSIS — Z1231 Encounter for screening mammogram for malignant neoplasm of breast: Secondary | ICD-10-CM

## 2016-08-28 ENCOUNTER — Ambulatory Visit
Admission: RE | Admit: 2016-08-28 | Discharge: 2016-08-28 | Disposition: A | Discharge status: Home / Self Care | Payer: Managed Care, Other (non HMO) | Source: Ambulatory Visit | Attending: Family Medicine | Admitting: Family Medicine

## 2016-08-28 DIAGNOSIS — Z1231 Encounter for screening mammogram for malignant neoplasm of breast: Secondary | ICD-10-CM

## 2016-11-16 HISTORY — PX: BREAST BIOPSY: SHX20

## 2016-11-30 ENCOUNTER — Observation Stay (HOSPITAL_COMMUNITY): Payer: Managed Care, Other (non HMO)

## 2016-11-30 ENCOUNTER — Observation Stay (HOSPITAL_COMMUNITY)
Admission: EM | Admit: 2016-11-30 | Discharge: 2016-12-02 | Disposition: A | Discharge status: Home / Self Care | Payer: Managed Care, Other (non HMO) | Attending: Family Medicine | Admitting: Family Medicine

## 2016-11-30 ENCOUNTER — Encounter (HOSPITAL_COMMUNITY): Payer: Self-pay | Admitting: Oncology

## 2016-11-30 DIAGNOSIS — Z79899 Other long term (current) drug therapy: Secondary | ICD-10-CM | POA: Diagnosis not present

## 2016-11-30 DIAGNOSIS — K219 Gastro-esophageal reflux disease without esophagitis: Secondary | ICD-10-CM | POA: Insufficient documentation

## 2016-11-30 DIAGNOSIS — Z794 Long term (current) use of insulin: Secondary | ICD-10-CM | POA: Insufficient documentation

## 2016-11-30 DIAGNOSIS — E860 Dehydration: Secondary | ICD-10-CM | POA: Insufficient documentation

## 2016-11-30 DIAGNOSIS — E669 Obesity, unspecified: Secondary | ICD-10-CM | POA: Diagnosis not present

## 2016-11-30 DIAGNOSIS — E1169 Type 2 diabetes mellitus with other specified complication: Secondary | ICD-10-CM | POA: Diagnosis present

## 2016-11-30 DIAGNOSIS — E1159 Type 2 diabetes mellitus with other circulatory complications: Secondary | ICD-10-CM | POA: Diagnosis present

## 2016-11-30 DIAGNOSIS — Q613 Polycystic kidney, unspecified: Secondary | ICD-10-CM | POA: Insufficient documentation

## 2016-11-30 DIAGNOSIS — E1165 Type 2 diabetes mellitus with hyperglycemia: Secondary | ICD-10-CM | POA: Diagnosis not present

## 2016-11-30 DIAGNOSIS — D62 Acute posthemorrhagic anemia: Principal | ICD-10-CM | POA: Insufficient documentation

## 2016-11-30 DIAGNOSIS — R1032 Left lower quadrant pain: Secondary | ICD-10-CM

## 2016-11-30 DIAGNOSIS — Z8719 Personal history of other diseases of the digestive system: Secondary | ICD-10-CM | POA: Diagnosis not present

## 2016-11-30 DIAGNOSIS — R6510 Systemic inflammatory response syndrome (SIRS) of non-infectious origin without acute organ dysfunction: Secondary | ICD-10-CM | POA: Diagnosis not present

## 2016-11-30 DIAGNOSIS — K922 Gastrointestinal hemorrhage, unspecified: Secondary | ICD-10-CM | POA: Diagnosis present

## 2016-11-30 DIAGNOSIS — E785 Hyperlipidemia, unspecified: Secondary | ICD-10-CM | POA: Diagnosis present

## 2016-11-30 DIAGNOSIS — I10 Essential (primary) hypertension: Secondary | ICD-10-CM | POA: Insufficient documentation

## 2016-11-30 DIAGNOSIS — N179 Acute kidney failure, unspecified: Secondary | ICD-10-CM | POA: Diagnosis not present

## 2016-11-30 DIAGNOSIS — K5792 Diverticulitis of intestine, part unspecified, without perforation or abscess without bleeding: Secondary | ICD-10-CM | POA: Insufficient documentation

## 2016-11-30 DIAGNOSIS — IMO0002 Reserved for concepts with insufficient information to code with codable children: Secondary | ICD-10-CM | POA: Diagnosis present

## 2016-11-30 DIAGNOSIS — K5732 Diverticulitis of large intestine without perforation or abscess without bleeding: Secondary | ICD-10-CM | POA: Diagnosis present

## 2016-11-30 DIAGNOSIS — E6609 Other obesity due to excess calories: Secondary | ICD-10-CM

## 2016-11-30 DIAGNOSIS — K625 Hemorrhage of anus and rectum: Secondary | ICD-10-CM | POA: Diagnosis present

## 2016-11-30 DIAGNOSIS — Z6841 Body Mass Index (BMI) 40.0 and over, adult: Secondary | ICD-10-CM | POA: Diagnosis not present

## 2016-11-30 HISTORY — DX: Diverticulosis of intestine, part unspecified, without perforation or abscess without bleeding: K57.90

## 2016-11-30 LAB — COMPREHENSIVE METABOLIC PANEL
ALBUMIN: 4.2 g/dL (ref 3.5–5.0)
ALT: 39 U/L (ref 14–54)
AST: 32 U/L (ref 15–41)
Alkaline Phosphatase: 53 U/L (ref 38–126)
Anion gap: 11 (ref 5–15)
BUN: 22 mg/dL — AB (ref 6–20)
CHLORIDE: 101 mmol/L (ref 101–111)
CO2: 25 mmol/L (ref 22–32)
CREATININE: 1.14 mg/dL — AB (ref 0.44–1.00)
Calcium: 9.2 mg/dL (ref 8.9–10.3)
GFR calc Af Amer: >60 mL/min (ref >60)
GFR, EST NON AFRICAN AMERICAN: 53 mL/min — AB (ref >60)
GLUCOSE: 252 mg/dL — AB (ref 65–99)
POTASSIUM: 4 mmol/L (ref 3.5–5.1)
SODIUM: 137 mmol/L (ref 135–145)
Total Bilirubin: 0.5 mg/dL (ref 0.3–1.2)
Total Protein: 8.3 g/dL — ABNORMAL HIGH (ref 6.5–8.1)

## 2016-11-30 LAB — URINALYSIS, ROUTINE W REFLEX MICROSCOPIC
Bilirubin Urine: NEGATIVE
Hgb urine dipstick: NEGATIVE
Ketones, ur: NEGATIVE mg/dL
Leukocytes, UA: NEGATIVE
NITRITE: NEGATIVE
PH: 5 (ref 5.0–8.0)
Protein, ur: NEGATIVE mg/dL
SPECIFIC GRAVITY, URINE: 1.025 (ref 1.005–1.030)

## 2016-11-30 LAB — I-STAT CG4 LACTIC ACID, ED: Lactic Acid, Venous: 2.06 mmol/L (ref 0.5–1.9)

## 2016-11-30 LAB — CBC
HEMATOCRIT: 37.7 % (ref 36.0–46.0)
Hemoglobin: 11.9 g/dL — ABNORMAL LOW (ref 12.0–15.0)
MCH: 21.1 pg — AB (ref 26.0–34.0)
MCHC: 31.6 g/dL (ref 30.0–36.0)
MCV: 66.8 fL — AB (ref 78.0–100.0)
PLATELETS: 422 10*3/uL — AB (ref 150–400)
RBC: 5.64 MIL/uL — ABNORMAL HIGH (ref 3.87–5.11)
RDW: 18.5 % — AB (ref 11.5–15.5)
WBC: 13.5 10*3/uL — AB (ref 4.0–10.5)

## 2016-11-30 LAB — POC OCCULT BLOOD, ED: Fecal Occult Bld: POSITIVE — AB

## 2016-11-30 LAB — PHOSPHORUS: PHOSPHORUS: 3.6 mg/dL (ref 2.5–4.6)

## 2016-11-30 LAB — C DIFFICILE QUICK SCREEN W PCR REFLEX
C DIFFICILE (CDIFF) INTERP: NOT DETECTED
C Diff antigen: NEGATIVE
C Diff toxin: NEGATIVE

## 2016-11-30 LAB — TYPE AND SCREEN
ABO/RH(D): O POS
Antibody Screen: NEGATIVE

## 2016-11-30 LAB — GLUCOSE, CAPILLARY
GLUCOSE-CAPILLARY: 106 mg/dL — AB (ref 65–99)
Glucose-Capillary: 160 mg/dL — ABNORMAL HIGH (ref 65–99)
Glucose-Capillary: 146 mg/dL — ABNORMAL HIGH (ref 65–99)

## 2016-11-30 LAB — MAGNESIUM: Magnesium: 1.6 mg/dL — ABNORMAL LOW (ref 1.7–2.4)

## 2016-11-30 LAB — TSH: TSH: 0.656 u[IU]/mL (ref 0.350–4.500)

## 2016-11-30 MED ORDER — HYOSCYAMINE SULFATE 0.125 MG SL SUBL
0.1250 mg | SUBLINGUAL_TABLET | Freq: Four times a day (QID) | SUBLINGUAL | Status: DC | PRN
  Filled 2016-11-30: qty 1

## 2016-11-30 MED ORDER — METRONIDAZOLE 500 MG PO TABS: 500.0000 mg | ORAL_TABLET | Freq: Three times a day (TID) | ORAL | Status: DC

## 2016-11-30 MED ORDER — IOPAMIDOL (ISOVUE-300) INJECTION 61%: 15.0000 mL | Freq: Two times a day (BID) | INTRAVENOUS | Status: DC | PRN

## 2016-11-30 MED ORDER — OXYCODONE HCL 5 MG PO TABS
5.0000 mg | ORAL_TABLET | Freq: Four times a day (QID) | ORAL | Status: DC | PRN
  Administered 2016-11-30 – 2016-12-02 (×3): 5 mg via ORAL
  Filled 2016-11-30 (×3): qty 1

## 2016-11-30 MED ORDER — CIPROFLOXACIN IN D5W 400 MG/200ML IV SOLN
400.0000 mg | Freq: Two times a day (BID) | INTRAVENOUS | Status: DC
  Administered 2016-11-30 – 2016-12-01 (×2): 400 mg via INTRAVENOUS
  Filled 2016-11-30 (×2): qty 200

## 2016-11-30 MED ORDER — INSULIN ASPART 100 UNIT/ML ~~LOC~~ SOLN
0.0000 [IU] | Freq: Three times a day (TID) | SUBCUTANEOUS | Status: DC
  Administered 2016-11-30: 3 [IU] via SUBCUTANEOUS
  Administered 2016-12-01 (×2): 2 [IU] via SUBCUTANEOUS
  Administered 2016-12-01 – 2016-12-02 (×2): 3 [IU] via SUBCUTANEOUS
  Administered 2016-12-02: 2 [IU] via SUBCUTANEOUS

## 2016-11-30 MED ORDER — CIPROFLOXACIN IN D5W 400 MG/200ML IV SOLN: 400.0000 mg | Freq: Once | INTRAVENOUS | Status: DC

## 2016-11-30 MED ORDER — MORPHINE SULFATE (PF) 4 MG/ML IV SOLN
4.0000 mg | Freq: Once | INTRAVENOUS | Status: AC
  Administered 2016-11-30: 4 mg via INTRAVENOUS
  Filled 2016-11-30: qty 1

## 2016-11-30 MED ORDER — CIPROFLOXACIN HCL 500 MG PO TABS: 500.0000 mg | ORAL_TABLET | Freq: Two times a day (BID) | ORAL | Status: DC

## 2016-11-30 MED ORDER — ACETAMINOPHEN 325 MG PO TABS
650.0000 mg | ORAL_TABLET | Freq: Four times a day (QID) | ORAL | Status: DC | PRN
  Administered 2016-11-30 – 2016-12-02 (×2): 650 mg via ORAL
  Filled 2016-11-30 (×2): qty 2

## 2016-11-30 MED ORDER — METRONIDAZOLE 500 MG PO TABS
500.0000 mg | ORAL_TABLET | Freq: Once | ORAL | Status: AC
  Administered 2016-11-30: 500 mg via ORAL
  Filled 2016-11-30: qty 1

## 2016-11-30 MED ORDER — IOPAMIDOL (ISOVUE-300) INJECTION 61%
INTRAVENOUS | Status: AC
  Administered 2016-11-30: 12:00:00
  Filled 2016-11-30: qty 30

## 2016-11-30 MED ORDER — LEVOFLOXACIN IN D5W 750 MG/150ML IV SOLN
750.0000 mg | Freq: Once | INTRAVENOUS | Status: DC
  Administered 2016-11-30: 750 mg via INTRAVENOUS
  Filled 2016-11-30: qty 150

## 2016-11-30 MED ORDER — CARVEDILOL 3.125 MG PO TABS
3.1250 mg | ORAL_TABLET | Freq: Two times a day (BID) | ORAL | Status: DC
  Administered 2016-11-30 – 2016-12-02 (×4): 3.125 mg via ORAL
  Filled 2016-11-30 (×4): qty 1

## 2016-11-30 MED ORDER — INSULIN ASPART 100 UNIT/ML ~~LOC~~ SOLN
0.0000 [IU] | Freq: Every day | SUBCUTANEOUS | Status: DC
  Administered 2016-12-01: 2 [IU] via SUBCUTANEOUS

## 2016-11-30 MED ORDER — ONDANSETRON HCL 4 MG/2ML IJ SOLN
4.0000 mg | Freq: Once | INTRAMUSCULAR | Status: AC
  Administered 2016-11-30: 4 mg via INTRAVENOUS
  Filled 2016-11-30: qty 2

## 2016-11-30 MED ORDER — METRONIDAZOLE 500 MG PO TABS
500.0000 mg | ORAL_TABLET | Freq: Three times a day (TID) | ORAL | Status: DC
  Administered 2016-11-30 – 2016-12-01 (×3): 500 mg via ORAL
  Filled 2016-11-30 (×3): qty 1

## 2016-11-30 MED ORDER — SODIUM CHLORIDE 0.9 % IV SOLN
INTRAVENOUS | Status: DC
  Administered 2016-11-30 – 2016-12-01 (×3): via INTRAVENOUS

## 2016-11-30 MED ORDER — SODIUM CHLORIDE 0.9 % IV BOLUS (SEPSIS)
1000.0000 mL | Freq: Once | INTRAVENOUS | Status: AC
  Administered 2016-11-30: 1000 mL via INTRAVENOUS

## 2016-11-30 MED ORDER — SODIUM CHLORIDE 0.9 % IV SOLN
Freq: Once | INTRAVENOUS | Status: AC
  Administered 2016-11-30: 08:00:00 via INTRAVENOUS

## 2016-11-30 MED ORDER — ONDANSETRON HCL 4 MG/2ML IJ SOLN: 4.0000 mg | Freq: Four times a day (QID) | INTRAMUSCULAR | Status: DC | PRN

## 2016-11-30 MED ORDER — ONDANSETRON HCL 4 MG PO TABS: 4.0000 mg | ORAL_TABLET | Freq: Four times a day (QID) | ORAL | Status: DC | PRN

## 2016-11-30 MED ORDER — INSULIN GLARGINE 100 UNIT/ML ~~LOC~~ SOLN
30.0000 [IU] | Freq: Every day | SUBCUTANEOUS | Status: DC
  Administered 2016-11-30 – 2016-12-01 (×2): 30 [IU] via SUBCUTANEOUS
  Filled 2016-11-30 (×3): qty 0.3

## 2016-11-30 MED ORDER — ACETAMINOPHEN 650 MG RE SUPP: 650.0000 mg | Freq: Four times a day (QID) | RECTAL | Status: DC | PRN

## 2016-11-30 MED ORDER — PANTOPRAZOLE SODIUM 40 MG PO TBEC
40.0000 mg | DELAYED_RELEASE_TABLET | Freq: Every day | ORAL | Status: DC
  Administered 2016-11-30 – 2016-12-02 (×3): 40 mg via ORAL
  Filled 2016-11-30 (×3): qty 1

## 2016-11-30 NOTE — Progress Notes (Signed)
Pharmacy Antibiotic Note  CERETHA LENTH is a 56 y.o. female admitted on 11/30/2016 with intra-abdominal infection.  Pharmacy has been consulted for Cipro/Flagyl dosing. Levaquin/Flagyl x1 in ED  Plan:  Ciprofloxacin 400 mg IV q12 hr, starting tomorrow  Flagyl 500 mg PO q8 hr    Height: 5\' 3"  (160 cm) Weight: 245 lb (111.1 kg) IBW/kg (Calculated) : 52.4  Temp (24hrs), Avg:98 F (36.7 C), Min:98 F (36.7 C), Max:98 F (36.7 C)   Recent Labs Lab 11/30/16 0427 11/30/16 0756  WBC 13.5*  --   CREATININE 1.14*  --   LATICACIDVEN  --  2.06*    Estimated Creatinine Clearance: 66 mL/min (by C-G formula based on SCr of 1.14 mg/dL (H)).    Allergies  Allergen Reactions  . Norvasc [Amlodipine Besylate] Swelling    ANGIOEDEMA  . Nsaids Other (See Comments)    GI upset/ GI bleed    Antimicrobials this admission: Levaquin x 1 Ciprofloxacin 1/16 >>  Flagyl 1/15 >>   Dose adjustments this admission: ---  Microbiology results: 1/15 BCx: sent 1/15 UCx: sent   Thank you for allowing pharmacy to be a part of this patient's care.  Reuel Boom, PharmD, BCPS Pager: [phone] 11/30/2016, 8:47 AM

## 2016-11-30 NOTE — ED Notes (Signed)
Hospitalist at bedside 

## 2016-11-30 NOTE — ED Provider Notes (Addendum)
[place] DEPT Provider Note   CSN: [id number] Arrival date & time: 11/30/16  0346     History   Chief Complaint Chief Complaint  Patient presents with  . Rectal Bleeding    HPI Carol Mueller is a 56 y.o. female.  HPI Pt comes in with cc of bloody stools. Pt has past medical history significant for diabetes mellitus, HTN, Diverticulitis and polycystic ovary disease. Pt reports that she has had 7 bloody BM since last night. PT also has developed LLQ abd pain. PT has nausea. Denies uti like  Sx. Pt is not on anticoagulation. No dizziness.  Past Medical History:  Diagnosis Date  . Anemia   . 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 Diabetes mellitus without complication (HCC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  . Hyperlipidemia   . Hypertension   . Polycystic ovary disease     Patient Active Problem List   Diagnosis Date Noted  . Acute lower GI bleeding 11/30/2016  . Diverticulitis large intestine 11/30/2016  . Diverticulosis of colon with hemorrhage 06/05/2016  . Ischemic colitis ([place]) 08/07/2015  . Acute blood loss anemia 08/07/2015  . Abdominal pain   . Hematochezia   . GI bleed 07/15/2015  . Tachycardia 07/15/2015  . Ankle pain 07/15/2015  . Bloody diarrhea 07/15/2015  . Diabetes mellitus ([place]) 09/12/2012  . Polycystic disease, ovaries 09/12/2012  . Obesity 09/12/2012  . Hypertension 09/12/2012  . Type II diabetes mellitus, uncontrolled ([place]) 09/12/2012  . Hyperlipidemia with target LDL less than 100 09/12/2012  . 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 Allergic rhinitis 04/06/2012    Past Surgical History:  Procedure Laterality Date  . ADRENALECTOMY  1996  . CHOLECYSTECTOMY    . ESOPHAGOGASTRODUODENOSCOPY (EGD) WITH PROPOFOL N/A 07/16/2015   Procedure: ESOPHAGOGASTRODUODENOSCOPY (EGD) WITH PROPOFOL;  Surgeon: [name], MD;  Location: [facility];  Service: Endoscopy;  Laterality: N/A;    OB History    No data available        Home Medications    Prior to Admission medications   Medication Sig Start Date End Date Taking? Authorizing Provider  Canagliflozin (INVOKANA) 100 MG TABS Take 100 mg by mouth daily.    Yes Historical Provider, MD  carvedilol (COREG) 3.125 MG tablet Take 3.125 mg by mouth 2 (two) times daily with a meal.   Yes Historical Provider, MD  insulin glargine (LANTUS) 100 UNIT/ML injection Inject 45 Units into the skin at bedtime.   Yes Historical Provider, MD  losartan-hydrochlorothiazide (HYZAAR) 100-25 MG per tablet TAKE 1 TABLET BY MOUTH DAILY. NEEDS OFFICE VISIT 03/20/13  Yes Shawnee Knapp, MD  Lutein-Zeaxanthin 20-1 MG CAPS Take 1 capsule by mouth daily.   Yes Historical Provider, MD  metFORMIN (GLUCOPHAGE) 1000 MG tablet Take 1 tablet (1,000 mg total) by mouth 2 (two) times daily with a meal. PATIENT NEEDS OFFICE VISIT FOR ADDITIONAL REFILLS 09/07/13  Yes Eleanore E Elana Alm, PA-C  Multiple Vitamins-Minerals (MULTIVITAMIN WITH MINERALS) tablet Take 1 tablet by mouth daily.   Yes Historical Provider, MD  Omega 3 1000 MG CAPS Take 1,000 mg by mouth daily.   Yes Historical Provider, MD  Specialty Vitamins Products (ADVANCED COLLAGEN PO) Take 1 scoop by mouth daily.   Yes Historical Provider, MD  Turmeric 500 MG TABS Take 1 tablet by mouth at bedtime.   Yes Historical Provider, MD  hyoscyamine (LEVSIN SL) 0.125 MG SL tablet Place 1 tablet (0.125 mg total) under the tongue  every 6 (six) hours as needed for cramping. Patient not taking: Reported on 11/30/2016 06/09/16   Robbie Lis, MD  pantoprazole (PROTONIX) 40 MG tablet TAKE 1 TABLET (40 MG TOTAL) BY MOUTH DAILY. Patient not taking: Reported on 11/30/2016 06/09/16   Robbie Lis, MD    Family History Family History  Problem Relation Age of Onset  . Hypertension Mother   . Cancer Mother     ?uterine or ovarian cancer  . Hypertension Father   . Diabetes Father   . Hypertension Sister   . Diabetes Sister   . Hypertension Brother   . Diabetes  Brother   . Hypertension Maternal Grandfather   . Diabetes Paternal Grandmother   . Breast cancer Maternal Grandmother   . Colon cancer Neg Hx     Social History Social History  Substance Use Topics  . Smoking status: Never Smoker  . Smokeless tobacco: Never Used  . Alcohol use No     Allergies   Norvasc [amlodipine besylate] and Nsaids   Review of Systems Review of Systems  ROS 10 Systems reviewed and are negative for acute change except as noted in the HPI.     Physical Exam Updated Vital Signs BP 131/82   Pulse 101   Temp 98 F (36.7 C) (Oral)   Resp 18   Ht 5\' 3"  (1.6 m)   Wt 245 lb (111.1 kg)   SpO2 96%   BMI 43.40 kg/m   Physical Exam  Constitutional: She is oriented to person, place, and time. She appears well-developed and well-nourished.  HENT:  Head: Normocephalic and atraumatic.  Eyes: EOM are normal. Pupils are equal, round, and reactive to light.  Neck: Neck supple.  Cardiovascular: Normal rate, regular rhythm and normal heart sounds.   No murmur heard. Pulmonary/Chest: Effort normal. No respiratory distress.  Abdominal: Soft. She exhibits no distension. There is tenderness. There is guarding. There is no rebound.  Generalized abd tenderness, but focal LLQ tenderness is worse and there is associated guarding.  Neurological: She is alert and oriented to person, place, and time.  Skin: Skin is warm and dry.  Nursing note and vitals reviewed.    ED Treatments / Results  Labs (all labs ordered are listed, but only abnormal results are displayed) Labs Reviewed  COMPREHENSIVE METABOLIC PANEL - Abnormal; Notable for the following:       Result Value   Glucose, Bld 252 (*)    BUN 22 (*)    Creatinine, Ser 1.14 (*)    Total Protein 8.3 (*)    GFR calc non Af Amer 53 (*)    All other components within normal limits  CBC - Abnormal; Notable for the following:    WBC 13.5 (*)    RBC 5.64 (*)    Hemoglobin 11.9 (*)    MCV 66.8 (*)    MCH  21.1 (*)    RDW 18.5 (*)    Platelets 422 (*)    All other components within normal limits  POC OCCULT BLOOD, ED - Abnormal; Notable for the following:    Fecal Occult Bld POSITIVE (*)    All other components within normal limits  I-STAT CG4 LACTIC ACID, ED - Abnormal; Notable for the following:    Lactic Acid, Venous 2.06 (*)    All other components within normal limits  URINE CULTURE  CULTURE, BLOOD (ROUTINE X 2)  CULTURE, BLOOD (ROUTINE X 2)  URINALYSIS, ROUTINE W REFLEX MICROSCOPIC  TYPE AND SCREEN  EKG  EKG Interpretation None       Radiology No results found.  Procedures Procedures (including critical care time)  Medications Ordered in ED Medications  sodium chloride 0.9 % bolus 1,000 mL (1,000 mLs Intravenous New Bag/Given 11/30/16 0813)  metroNIDAZOLE (FLAGYL) tablet 500 mg (500 mg Oral Given 11/30/16 0812)  0.9 %  sodium chloride infusion ( Intravenous New Bag/Given 11/30/16 0814)  morphine 4 MG/ML injection 4 mg (4 mg Intravenous Given 11/30/16 0820)  ondansetron (ZOFRAN) injection 4 mg (4 mg Intravenous Given 11/30/16 [id number])     Initial Impression / Assessment and Plan / ED Course  I have reviewed the triage vital signs and the nursing notes.  Pertinent labs & imaging results that were available during my care of the patient were reviewed by me and considered in my medical decision making (see chart for details).  Clinical Course    PT comes in with cc of abd pain. Abd pain is worse on the L side and there is bloody stools - guaiac +. Pt is having loose BM for the past 2-3 days. No risk for Cdiff - I have ordered stool studies, but suspect that her bloody symptoms are not related to bacterial colitis.  There is a shortage of iv flagyl - we will give oral flagyl due to that. iv cipro ordered.  Sepsis workup initiated. Cr bumped - fluid ordered. Pt would have been discharged if it weren't for the bleed with oral diverticulitis meds - so I wouldn't qualify  this patient as severe sepsis.  Final Clinical Impressions(s) / ED Diagnoses   Final diagnoses:  Acute diverticulitis  History of GI diverticular bleed  AKI (acute kidney injury) [facility])    New Prescriptions New Prescriptions   No medications on file     Varney Biles, MD 11/30/16 [place], MD 11/30/16 [id number]

## 2016-11-30 NOTE — Progress Notes (Signed)
Patient transferred from ED. Oriented to the unit. Vital signs stable. Patient is aware regarding the need of the Urine sample and a stool sample.

## 2016-11-30 NOTE — ED Notes (Signed)
2 sets of blood cultures collected prior to antibiotic administration. 

## 2016-11-30 NOTE — ED Notes (Signed)
Pt cannot use restroom at this time, aware urine specimen is needed.  

## 2016-11-30 NOTE — H&P (Signed)
History and Physical    Carol Mueller [id number] DOB: 1961-07-11 DOA: 11/30/2016  Referring Provider: Dr. Kathrynn Humble  PCP: Delman Cheadle, MD  Outpatient Specialists:  Dr. Sharlett Iles [facility])  Patient coming from: home  Chief Complaint: abd pain, blood in her stools  HPI: Carol Mueller is a 56 y.o. female with PMH significant for obesity, Type 2 diabetes mellitus, HTN, HLD, polycystic ovarian syndrome and diverticulosis; presented to ED with complaints of  abd pain. Pain is localized in her LLQ, 7/10 in intensity, constant, radiated across lower abdomen, no alleviating or aggravating factors and associated with nausea and blood diarrhea. Patient denies, hematemesis, dysuria, hematuria, CP, SOB, focal weakness, HA's, cough, fever,SOB or any other complaints.  ED Course: patient have FOBT done which was positive and Blood work demonstrated mild elevation on her WBC's and lactic acid. IVF's given and cipro/flagyl started. Given active blood in her stools and complaints of nausea, TRH called to place in observation for further evaluation and treatment.  Review of Systems:  All other systems reviewed and apart from HPI, are negative.  Past Medical History:  Diagnosis Date  . Anemia   . 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 Diabetes mellitus without complication (HCC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  . Hyperlipidemia   . Hypertension   . Polycystic ovary disease     Past Surgical History:  Procedure Laterality Date  . ADRENALECTOMY  1996  . CHOLECYSTECTOMY    . ESOPHAGOGASTRODUODENOSCOPY (EGD) WITH PROPOFOL N/A 07/16/2015   Procedure: ESOPHAGOGASTRODUODENOSCOPY (EGD) WITH PROPOFOL;  Surgeon: [name], MD;  Location: [facility] END[facility];  Service: Endoscopy;  Laterality: N/A;     reports that she has never smoked. She has never used smokeless tobacco. She reports that she does not drink alcohol or use drugs.  Allergies  Allergen Reactions  . Norvasc [Amlodipine Besylate]  Swelling    ANGIOEDEMA  . Nsaids Other (See Comments)    GI upset/ GI bleed    Family History  Problem Relation Age of Onset  . Hypertension Mother   . Cancer Mother     ?uterine or ovarian cancer  . Hypertension Father   . Diabetes Father   . Hypertension Sister   . Diabetes Sister   . Hypertension Brother   . Diabetes Brother   . Hypertension Maternal Grandfather   . Diabetes Paternal Grandmother   . Breast cancer Maternal Grandmother   . Colon cancer Neg Hx      Prior to Admission medications   Medication Sig Start Date End Date Taking? Authorizing Provider  Canagliflozin (INVOKANA) 100 MG TABS Take 100 mg by mouth daily.    Yes Historical Provider, MD  carvedilol (COREG) 3.125 MG tablet Take 3.125 mg by mouth 2 (two) times daily with a meal.   Yes Historical Provider, MD  insulin glargine (LANTUS) 100 UNIT/ML injection Inject 45 Units into the skin at bedtime.   Yes Historical Provider, MD  losartan-hydrochlorothiazide (HYZAAR) 100-25 MG per tablet TAKE 1 TABLET BY MOUTH DAILY. NEEDS OFFICE VISIT 03/20/13  Yes Shawnee Knapp, MD  Lutein-Zeaxanthin 20-1 MG CAPS Take 1 capsule by mouth daily.   Yes Historical Provider, MD  metFORMIN (GLUCOPHAGE) 1000 MG tablet Take 1 tablet (1,000 mg total) by mouth 2 (two) times daily with a meal. PATIENT NEEDS OFFICE VISIT FOR ADDITIONAL REFILLS 09/07/13  Yes Eleanore E Elana Alm, PA-C  Multiple Vitamins-Minerals (MULTIVITAMIN WITH MINERALS) tablet Take 1 tablet by mouth daily.   Yes Historical Provider,  MD  Omega 3 1000 MG CAPS Take 1,000 mg by mouth daily.   Yes Historical Provider, MD  Specialty Vitamins Products (ADVANCED COLLAGEN PO) Take 1 scoop by mouth daily.   Yes Historical Provider, MD  Turmeric 500 MG TABS Take 1 tablet by mouth at bedtime.   Yes Historical Provider, MD  hyoscyamine (LEVSIN SL) 0.125 MG SL tablet Place 1 tablet (0.125 mg total) under the tongue every 6 (six) hours as needed for cramping. Patient not taking: Reported on  11/30/2016 06/09/16   Robbie Lis, MD  pantoprazole (PROTONIX) 40 MG tablet TAKE 1 TABLET (40 MG TOTAL) BY MOUTH DAILY. Patient not taking: Reported on 11/30/2016 06/09/16   Robbie Lis, MD    Physical Exam: Vitals:   11/30/16 0730 11/30/16 0739 11/30/16 0802 11/30/16 0841  BP: 119/75 119/75 118/70 131/82  Pulse: 99 101 102 101  Resp:  20 18 18   Temp:      TempSrc:      SpO2: 95% 95% 95% 96%  Weight:      Height:        Constitutional: NAD, calm and comfortable overall. Patient is afebrile, denies CP and SOB. Endorses some nausea, but no vomiting.  Eyes: PERTLA, lids and conjunctivae normal, no icterus, no nystagmus ENMT: Mucous membranes slightly dry on exam. Posterior pharynx clear of any exudate or lesions. Normal dentition.  Neck: normal, supple, no masses, no thyromegaly, no JVD Respiratory: clear to auscultation bilaterally, no wheezing, no crackles. Normal respiratory effort. No accessory muscle use.  Cardiovascular: S1 & S2 heard, regular rate and rhythm, no murmurs / rubs / gallops. No extremity edema. 2+ pedal pulses. No carotid bruits.  Abdomen: No distension, LLQ tenderness, no masses palpated. No hepatosplenomegaly. Bowel sounds normal. Patient with soft abdomen and just mild guarding in LLQ area. Musculoskeletal: no clubbing / cyanosis. No joint deformity upper and lower extremities. Good ROM, no contractures. Normal muscle tone.  Skin: no rashes, lesions, ulcers. No induration Neurologic: CN 2-12 grossly intact. Sensation intact, DTR normal. Strength 5/5 in all 4 limbs.  Psychiatric: Normal judgment and insight. Alert and oriented x 3. Normal mood.    Labs on Admission: I have personally reviewed following labs and imaging studies  CBC:  Recent Labs Lab 11/30/16 0427  WBC 13.5*  HGB 11.9*  HCT 37.7  MCV 66.8*  PLT [id number]*   Basic Metabolic Panel:  Recent Labs Lab 11/30/16 0427  NA 137  K 4.0  CL 101  CO2 25  GLUCOSE 252*  BUN 22*  CREATININE 1.14*    CALCIUM 9.2   GFR: Estimated Creatinine Clearance: 66 mL/min (by C-G formula based on SCr of 1.14 mg/dL (H)).   Liver Function Tests:  Recent Labs Lab 11/30/16 0427  AST 32  ALT 39  ALKPHOS 53  BILITOT 0.5  PROT 8.3*  ALBUMIN 4.2   Urine analysis:    Component Value Date/Time   BILIRUBINUR neg 08/25/2014 1157   PROTEINUR neg 08/25/2014 1157   UROBILINOGEN 0.2 08/25/2014 1157   NITRITE neg 08/25/2014 1157   LEUKOCYTESUR Negative 08/25/2014 1157    Radiological Exams on Admission: No results found.  EKG:  None   Assessment/Plan 1-Acute lower GI bleeding: with LLQ pain and bloody stools. Positive FOBT in ED. Had hx of diverticulosis. Most likely diverticular bleed with presence of diverticulitis. -hemodynamically stable and with Hgb in 11.9 range -will check CT abd and pelvis -start treatment with cipro and flagyl -full liquid diet and advance as tolerated -  PRN analgesics and antiemetics  -will provide IVF's resuscitation and will follow Hgb trend -patient type and screened and two large bore IV's requested. No need for transfusion currently  -no sepsis present on admission. Mild SIRS due to diverticular process and dehydration. Will follow VS and clinical response.   2-Hypertension -will continue coreg -holding HCTZ and losartan -follow VS and adjust medications as needed -BP well controlled currently  3-Type II diabetes mellitus, uncontrolled ([place]): with hyperglycemia on presentation, but last A1C on records in the 7.4 range -will repeat A1C, continue lantus and SSI -will hold all her oral hypoglycemic agents   4-Hyperlipidemia with target LDL less than 100 -will check lipid panel -was only using fish oil at home; which ca be resumed at discharge  5-obesity -Body mass index is 43.4 kg/m. -low calorie diet and exercise discussed withpatient  6-GERD -will continue PPI  7-dehydration and AKI -will provide IVF's and hold nephrotoxic agents    Time:  70 minutes  DVT prophylaxis: SCD's Code Status: Full code Family Communication: no family at bedside   Disposition Plan: home when medically stable (needs to assess stability of her Hgb and assure PO tolerance) Consults called: none (but depending clinical response might need GI consult, curb side with Dr. Hilarie Fredrickson and recommended treatment for diverticulitis and supportive care.) Admission status: observation, LOS < 2 midnights, med-surg bed   Barton Dubois MD [facility] Pager [phone]  If 7PM-7AM, please contact night-coverage [email or web address] Password [facility]  11/30/2016, 8:52 AM

## 2016-11-30 NOTE — ED Triage Notes (Signed)
Pt states she began having large bright red bloody stools at approximately midnight.  Pt states she has had 4 episodes of BRBPR.  Pt had a GI bleed in July and was admitted.  Pt is A&Ox 4.  C/o pain in her LLQ.  Rates it 4/10.

## 2016-11-30 NOTE — ED Notes (Signed)
Patient made aware of urine sample. Patient states she is unable to void at this time. Patient encouraged to void when able. 

## 2016-12-01 DIAGNOSIS — K922 Gastrointestinal hemorrhage, unspecified: Secondary | ICD-10-CM | POA: Diagnosis not present

## 2016-12-01 DIAGNOSIS — I10 Essential (primary) hypertension: Secondary | ICD-10-CM | POA: Diagnosis not present

## 2016-12-01 DIAGNOSIS — E1165 Type 2 diabetes mellitus with hyperglycemia: Secondary | ICD-10-CM | POA: Diagnosis not present

## 2016-12-01 DIAGNOSIS — E785 Hyperlipidemia, unspecified: Secondary | ICD-10-CM | POA: Diagnosis not present

## 2016-12-01 LAB — CBC
HCT: 28.4 % — ABNORMAL LOW (ref 36.0–46.0)
HEMATOCRIT: 26.9 % — AB (ref 36.0–46.0)
Hemoglobin: 9 g/dL — ABNORMAL LOW (ref 12.0–15.0)
Hemoglobin: 8.4 g/dL — ABNORMAL LOW (ref 12.0–15.0)
MCH: 21.2 pg — AB (ref 26.0–34.0)
MCH: 21 pg — AB (ref 26.0–34.0)
MCHC: 31.7 g/dL (ref 30.0–36.0)
MCHC: 31.2 g/dL (ref 30.0–36.0)
MCV: 66.8 fL — AB (ref 78.0–100.0)
MCV: 67.3 fL — ABNORMAL LOW (ref 78.0–100.0)
PLATELETS: 340 10*3/uL (ref 150–400)
Platelets: 348 10*3/uL (ref 150–400)
RBC: 4.25 MIL/uL (ref 3.87–5.11)
RBC: 4 MIL/uL (ref 3.87–5.11)
RDW: 18.7 % — AB (ref 11.5–15.5)
RDW: 18.6 % — ABNORMAL HIGH (ref 11.5–15.5)
WBC: 12.3 10*3/uL — ABNORMAL HIGH (ref 4.0–10.5)
WBC: 12 10*3/uL — ABNORMAL HIGH (ref 4.0–10.5)

## 2016-12-01 LAB — BASIC METABOLIC PANEL
Anion gap: 6 (ref 5–15)
BUN: 12 mg/dL (ref 6–20)
CALCIUM: 8.5 mg/dL — AB (ref 8.9–10.3)
CO2: 27 mmol/L (ref 22–32)
CREATININE: 0.71 mg/dL (ref 0.44–1.00)
Chloride: 105 mmol/L (ref 101–111)
GFR calc Af Amer: >60 mL/min (ref >60)
GLUCOSE: 118 mg/dL — AB (ref 65–99)
Potassium: 3.7 mmol/L (ref 3.5–5.1)
SODIUM: 138 mmol/L (ref 135–145)

## 2016-12-01 LAB — GLUCOSE, CAPILLARY
Glucose-Capillary: 133 mg/dL — ABNORMAL HIGH (ref 65–99)
Glucose-Capillary: 176 mg/dL — ABNORMAL HIGH (ref 65–99)
Glucose-Capillary: 145 mg/dL — ABNORMAL HIGH (ref 65–99)
Glucose-Capillary: 238 mg/dL — ABNORMAL HIGH (ref 65–99)

## 2016-12-01 LAB — URINE CULTURE

## 2016-12-01 LAB — HEMOGLOBIN A1C
HEMOGLOBIN A1C: 7.5 % — AB (ref 4.8–5.6)
Mean Plasma Glucose: 169 mg/dL

## 2016-12-01 NOTE — Progress Notes (Addendum)
PROGRESS NOTE  GIAH CHUE  [id number] DOB: [birth year] DOA: 11/30/2016 PCP: Delman Cheadle, MD  Brief Narrative:   The patient is a 56 year old female with history of diabetes mellitus type 2, hypertension, hyperlipidemia, polycystic ovarian syndrome, diverticulosis who presented to the emergency department with abdominal pain in the left lower quadrant, nausea, and blood diarrhea. In the emergency department, her fecal occult blood test was positive, she had mild elevation of her white blood cells and lactic acid. She was started empirically on ciprofloxacin and Flagyl for diverticulitis and admitted for observation due to GI bleed. Her hemoglobin has trended down to 8.4 g/dL but she has not required any blood transfusions and her blood pressure has remained stable. She had a CT scan of the abdomen and pelvis which demonstrated no evidence of diverticulitis and her antibiotics have since been discontinued. We will repeat her CBC in the morning. If her hemoglobin remained stable for 24 hours and she does not have any further evidence of bleeding, she could potentially be discharged on iron supplementation with close follow-up with outpatient gastroenterology.  Assessment & Plan:   Principal Problem:   Acute lower GI bleeding Active Problems:   Hypertension   Type II diabetes mellitus, uncontrolled ([place])   Hyperlipidemia with target LDL less than 100   Diverticulitis large intestine   Lower GI bleed  Acute blood loss anemia secondary to lower GI bleed.  Diverticular bleed usually does not cause abdominal pain. She may have had some mild ischemic colitis which was not seen on her CT scan.  Continue to monitor. - Repeat CBC this afternoon: Hemoglobin down to 8.4 g/dL - Repeat CBC in a.m. -  Discontinue ciprofloxacin and Flagyl -  Continue soft diet  Hypertension, blood pressures rising but hemoglobin still continuing to trend down -  Continue Coreg -  Continue to hold HCTZ and  losartan  Diabetes mellitus type 2, uncontrolled, lasting hemoglobin A1c 7.5 % -  Continue Lantus 30 units with moderate dose sliding scale insulin  Hyperlipidemia, stable, continue fish oil at discharge  Obesity with BMI of 43.4 kg/m -  Advised low-calorie diet and exercise  GERD, stable, continue PPI  Acute kidney injury, resolved with IV fluids.  Polycystic kidneys on CT scan noted.  DVT prophylaxis:  SCDs Code Status:  Full code Family Communication:  Patient alone Disposition Plan:  Possibly home tomorrow if she has no further bleeding and hemoglobin remained stable for 24 hours   Consultants:   None  Procedures:  None  Antimicrobials:  Anti-infectives    Start     Dose/Rate Route Frequency Ordered Stop   12/01/16 0800  ciprofloxacin (CIPRO) IVPB 400 mg  Status:  Discontinued     400 mg 200 mL/hr over 60 Minutes Intravenous Every 12 hours 11/30/16 0844 12/01/16 1041   11/30/16 1400  metroNIDAZOLE (FLAGYL) tablet 500 mg  Status:  Discontinued     500 mg Oral Every 8 hours 11/30/16 0844 12/01/16 1041   11/30/16 1400  metroNIDAZOLE (FLAGYL) tablet 500 mg  Status:  Discontinued     500 mg Oral Every 8 hours 11/30/16 1025 11/30/16 1029   11/30/16 1030  ciprofloxacin (CIPRO) tablet 500 mg  Status:  Discontinued     500 mg Oral 2 times daily 11/30/16 1025 11/30/16 1033   11/30/16 0730  levofloxacin (LEVAQUIN) IVPB 750 mg  Status:  Discontinued     750 mg 100 mL/hr over 90 Minutes Intravenous  Once 11/30/16 0719 11/30/16 0839   11/30/16 0730  ciprofloxacin (CIPRO) IVPB 400 mg  Status:  Discontinued     400 mg 200 mL/hr over 60 Minutes Intravenous  Once 11/30/16 0719 11/30/16 0740   11/30/16 0730  metroNIDAZOLE (FLAGYL) tablet 500 mg     500 mg Oral  Once 11/30/16 0719 11/30/16 0812       Subjective: Mild persistent pain in the left lower quadrant. No further bloody stool since yesterday.  Denies nausea or vomiting.  She feels worn out today.  Objective: Vitals:    11/30/16 1657 11/30/16 2143 12/01/16 0629 12/01/16 1435  BP: 108/63 112/66 140/81 111/74  Pulse: 86 86 84 91  Resp:  18 18 18   Temp:  98 F (36.7 C) 98.8 F (37.1 C) 99.1 F (37.3 C)  TempSrc:  Oral Oral Oral  SpO2:  100% 100% 98%  Weight:      Height:        Intake/Output Summary (Last 24 hours) at 12/01/16 1713 Last data filed at 12/01/16 1315  Gross per 24 hour  Intake              720 ml  Output                0 ml  Net              720 ml   Filed Weights   11/30/16 0352  Weight: 111.1 kg (245 lb)    Examination:  General exam:  Adult Female.  No acute distress.  HEENT:  NCAT, MMM Respiratory system: Clear to auscultation bilaterally Cardiovascular system: Regular rate and rhythm, normal S1/S2. No murmurs, rubs, gallops or clicks.  Warm extremities Gastrointestinal system: Normal active bowel sounds, soft, nondistended, mild tenderness to palpation in left lower quadrant without rebound or guarding MSK:  Normal tone and bulk, no lower extremity edema Neuro:  Grossly intact    Data Reviewed: I have personally reviewed following labs and imaging studies  CBC:  Recent Labs Lab 11/30/16 0427 12/01/16 0642 12/01/16 1405  WBC 13.5* 12.3* 12.0*  HGB 11.9* 9.0* 8.4*  HCT 37.7 28.4* 26.9*  MCV 66.8* 66.8* 67.3*  PLT 422* 340 0000000   Basic Metabolic Panel:  Recent Labs Lab 11/30/16 0427 11/30/16 1045 12/01/16 0642  NA 137  --  138  K 4.0  --  3.7  CL 101  --  105  CO2 25  --  27  GLUCOSE 252*  --  118*  BUN 22*  --  12  CREATININE 1.14*  --  0.71  CALCIUM 9.2  --  8.5*  MG  --  1.6*  --   PHOS  --  3.6  --    GFR: Estimated Creatinine Clearance: 94.1 mL/min (by C-G formula based on SCr of 0.71 mg/dL). Liver Function Tests:  Recent Labs Lab 11/30/16 0427  AST 32  ALT 39  ALKPHOS 53  BILITOT 0.5  PROT 8.3*  ALBUMIN 4.2   No results for input(s): LIPASE, AMYLASE in the last 168 hours. No results for input(s): AMMONIA in the last 168  hours. Coagulation Profile: No results for input(s): INR, PROTIME in the last 168 hours. Cardiac Enzymes: No results for input(s): CKTOTAL, CKMB, CKMBINDEX, TROPONINI in the last 168 hours. BNP (last 3 results) No results for input(s): PROBNP in the last 8760 hours. HbA1C:  Recent Labs  11/30/16 1045  HGBA1C 7.5*   CBG:  Recent Labs Lab 11/30/16 1646 11/30/16 2142 12/01/16 0747 12/01/16 1124 12/01/16 1648  GLUCAP 160* 146* 133*  176* 145*   Lipid Profile: No results for input(s): CHOL, HDL, LDLCALC, TRIG, CHOLHDL, LDLDIRECT in the last 72 hours. Thyroid Function Tests:  Recent Labs  11/30/16 1045  TSH 0.656   Anemia Panel: No results for input(s): VITAMINB12, FOLATE, FERRITIN, TIBC, IRON, RETICCTPCT in the last 72 hours. Urine analysis:    Component Value Date/Time   COLORURINE YELLOW 11/30/2016 1204   APPEARANCEUR CLEAR 11/30/2016 1204   LABSPEC 1.025 11/30/2016 1204   PHURINE 5.0 11/30/2016 1204   GLUCOSEU >=500 (A) 11/30/2016 1204   HGBUR NEGATIVE 11/30/2016 [place] 11/30/2016 1204   BILIRUBINUR neg 08/25/2014 [place] 11/30/2016 1204   PROTEINUR NEGATIVE 11/30/2016 1204   UROBILINOGEN 0.2 08/25/2014 1157   NITRITE NEGATIVE 11/30/2016 1204   LEUKOCYTESUR NEGATIVE 11/30/2016 1204   Sepsis Labs: @LABRCNTIP (procalcitonin:4,lacticidven:4)  ) Recent Results (from the past 240 hour(s))  Blood culture (routine x 2)     Status: None (Preliminary result)   Collection Time: 11/30/16  7:51 AM  Result Value Ref Range Status   Specimen Description BLOOD LEFT WRIST  Final   Special Requests BOTTLES DRAWN AEROBIC AND ANAEROBIC 5ML  Final   Culture   Final    NO GROWTH 1 DAY Performed at [facility], [place] [address], [place], [place] [address]    Report Status PENDING  Incomplete  Blood culture (routine x 2)     Status: None (Preliminary result)   Collection Time: 11/30/16  7:59 AM  Result Value Ref Range Status    Specimen Description BLOOD LEFT ANTECUBITAL  Final   Special Requests BOTTLES DRAWN AEROBIC AND ANAEROBIC 5ML  Final   Culture   Final    NO GROWTH 1 DAY Performed at [facility], [place] [address], [place], [place] [address]    Report Status PENDING  Incomplete  Urine culture     Status: Abnormal   Collection Time: 11/30/16 12:04 PM  Result Value Ref Range Status   Specimen Description URINE, RANDOM  Final   Special Requests NONE  Final   Culture MULTIPLE SPECIES PRESENT, SUGGEST RECOLLECTION (A)  Final   Report Status 12/01/2016 FINAL  Final  C difficile quick scan w PCR reflex     Status: None   Collection Time: 11/30/16  6:50 PM  Result Value Ref Range Status   C Diff antigen NEGATIVE NEGATIVE Final   C Diff toxin NEGATIVE NEGATIVE Final   C Diff interpretation No C. difficile detected.  Final      Radiology Studies: Ct Abdomen Pelvis Wo Contrast  Result Date: 11/30/2016 CLINICAL DATA:  Abdominal pain. History of diverticulosis and polycystic ovary syndrome. EXAM: CT ABDOMEN AND PELVIS WITHOUT CONTRAST TECHNIQUE: Multidetector CT imaging of the abdomen and pelvis was performed following the standard protocol without IV contrast. Oral contrast was administered. COMPARISON:  CT abdomen and pelvis June 05, 2016 FINDINGS: Lower chest: There is mild scarring in the posterior left lung base. No lung base edema or consolidation evident. Hepatobiliary: No focal liver lesions are evident on this noncontrast enhanced study. Gallbladder is absent. There is no appreciable biliary duct dilatation. Pancreas: No pancreatic mass or inflammatory focus. Spleen: No splenic lesions are evident. Adrenals/Urinary Tract: Right adrenal is absent. Left adrenal appears unremarkable. There is a 2.3 x 2.3 cm cyst in the mid right kidney. There is a cyst in the anterior mid right kidney measuring 1.3 x 1.3 cm. There is a cyst arising in the lower pole the  left kidney measuring 1.7 x 1.7 cm. There is no  hydronephrosis on either side. There is no evident ureteral calculus or ureterectasis on either side. Urinary bladder is midline with wall thickness within normal limits. Stomach/Bowel: Rectum is mildly distended with stool. There are scattered diverticula throughout the colon without diverticulitis. There is no bowel wall or mesenteric thickening. There is no evident bowel obstruction. No free air or portal venous air. Vascular/Lymphatic: There is no abdominal aortic aneurysm. No vascular lesions are evident on this noncontrast enhanced study. No adenopathy is appreciable in the abdomen or pelvis. Reproductive: The uterus is anteverted. The uterus is enlarged with multiple lobulated partially calcified masses arising throughout the uterus consistent with diffuse leiomyomatous change. The largest individual masses noted in the anterior leftward aspect of the uterus measuring 5.5 x 6.3 x 5.5 cm. No pelvic masses are identified apart from the uterus. Other: Appendix appears normal. There is no ascites or abscess in the abdomen or pelvis. Musculoskeletal: There is degenerative change in lumbar spine. There is spinal stenosis at L3-4 and L4-5 due to diffuse disc protrusion and bony hypertrophy. There are no blastic or lytic bone lesions. There is no intramuscular or abdominal wall lesion. 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 Colonic diverticula without diverticulitis. No bowel obstruction. No abscess. Appendix appears normal. Status post right adrenalectomy and cholecystectomy. No renal or ureteral calculus.  No hydronephrosis. 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 Electronically Signed   By: Lowella Grip III M.D.   On: 11/30/2016 14:56     Scheduled Meds: . carvedilol  3.125 mg Oral BID WC  . insulin aspart  0-15 Units Subcutaneous TID WC  . insulin aspart  0-5 Units Subcutaneous QHS  . insulin glargine  30  Units Subcutaneous QHS  . pantoprazole  40 mg Oral Daily   Continuous Infusions:   LOS: 0 days    Time spent: 30 min    Janece Canterbury, MD [facility] Pager [phone]  If 7PM-7AM, please contact night-coverage [email or web address] Password [email or web address] 12/01/2016, 5:13 PM

## 2016-12-02 ENCOUNTER — Encounter (HOSPITAL_COMMUNITY): Payer: Self-pay

## 2016-12-02 DIAGNOSIS — E785 Hyperlipidemia, unspecified: Secondary | ICD-10-CM | POA: Diagnosis not present

## 2016-12-02 DIAGNOSIS — Z794 Long term (current) use of insulin: Secondary | ICD-10-CM

## 2016-12-02 DIAGNOSIS — K922 Gastrointestinal hemorrhage, unspecified: Secondary | ICD-10-CM | POA: Diagnosis not present

## 2016-12-02 DIAGNOSIS — I10 Essential (primary) hypertension: Secondary | ICD-10-CM

## 2016-12-02 DIAGNOSIS — E1165 Type 2 diabetes mellitus with hyperglycemia: Secondary | ICD-10-CM

## 2016-12-02 DIAGNOSIS — K5733 Diverticulitis of large intestine without perforation or abscess with bleeding: Secondary | ICD-10-CM

## 2016-12-02 LAB — CBC
HEMATOCRIT: 28.5 % — AB (ref 36.0–46.0)
HEMOGLOBIN: 8.8 g/dL — AB (ref 12.0–15.0)
MCH: 20.7 pg — ABNORMAL LOW (ref 26.0–34.0)
MCHC: 30.9 g/dL (ref 30.0–36.0)
MCV: 67.1 fL — AB (ref 78.0–100.0)
Platelets: 390 10*3/uL (ref 150–400)
RBC: 4.25 MIL/uL (ref 3.87–5.11)
RDW: 18.7 % — ABNORMAL HIGH (ref 11.5–15.5)
WBC: 11.5 10*3/uL — ABNORMAL HIGH (ref 4.0–10.5)

## 2016-12-02 LAB — GLUCOSE, CAPILLARY
GLUCOSE-CAPILLARY: 169 mg/dL — AB (ref 65–99)
Glucose-Capillary: 146 mg/dL — ABNORMAL HIGH (ref 65–99)

## 2016-12-02 MED ORDER — FERROUS SULFATE 325 (65 FE) MG PO TABS: 325.0000 mg | ORAL_TABLET | Freq: Every day | ORAL | 0 refills | Status: DC

## 2016-12-02 MED ORDER — METRONIDAZOLE 500 MG PO TABS: 500.0000 mg | ORAL_TABLET | Freq: Two times a day (BID) | ORAL | 0 refills | Status: AC

## 2016-12-02 MED ORDER — METRONIDAZOLE 500 MG PO TABS: 500.0000 mg | ORAL_TABLET | Freq: Two times a day (BID) | ORAL | 0 refills | Status: DC

## 2016-12-02 MED ORDER — CIPROFLOXACIN HCL 500 MG PO TABS: 500.0000 mg | ORAL_TABLET | Freq: Two times a day (BID) | ORAL | 0 refills | Status: DC

## 2016-12-02 MED ORDER — CIPROFLOXACIN HCL 500 MG PO TABS: 500.0000 mg | ORAL_TABLET | Freq: Two times a day (BID) | ORAL | 0 refills | Status: AC

## 2016-12-02 NOTE — Discharge Summary (Signed)
Physician Discharge Summary  Carol Mueller [id number] DOB: [birth year] DOA: 11/30/2016  PCP: Delman Cheadle, MD  Admit date: 11/30/2016 Discharge date: 12/02/2016  Admitted From: Home  Disposition: Home   Recommendations for Outpatient Follow-up:  1. Follow up with PCP and GI in 1-2 weeks 2. Please obtain CBC in 1-2 weeks  Discharge Condition: STABLE CODE STATUS: FULL   Brief/Interim Summary: HPI: Carol Mueller is a 56 y.o. female with PMH significant for obesity, Type 2 diabetes mellitus, HTN, HLD, polycystic ovarian syndrome and diverticulosis; presented to ED with complaints of  abd pain. Pain is localized in her LLQ, 7/10 in intensity, constant, radiated across lower abdomen, no alleviating or aggravating factors and associated with nausea and blood diarrhea. Patient denies, hematemesis, dysuria, hematuria, CP, SOB, focal weakness, HA's, cough, fever,SOB or any other complaints.  ED Course: patient have FOBT done which was positive and Blood work demonstrated mild elevation on her WBC's and lactic acid. IVF's given and cipro/flagyl started. Given active blood in her stools and complaints of nausea, TRH called to place in observation for further evaluation and treatment.  Acute blood loss anemia secondary to lower GI bleed and suspect diverticulitis - Abd pain much improved, WBC trending down - Discharge home on cipro/flagyl x 7 days and close GI follow up and PCP follow up - Repeat Hg 1/17 shows slight up trend of hemoglobin to 8.8.  Discharge home on iron supplement daily.  Hypertension,  -resume home regimen  Diabetes mellitus type 2, uncontrolled, lasting hemoglobin A1c 7.5 % -  Resume home regimen with close outpatient follow up with PCP  CBG (last 3)   Recent Labs  12/01/16 2040 12/02/16 0735 12/02/16 1150  GLUCAP 238* 146* 169*   Hyperlipidemia, stable, continue fish oil at discharge  Obesity with BMI of 43.4 kg/m -  Advised low-calorie diet and  exercise  GERD, stable, continue PPI  Acute kidney injury, resolved with IV fluids.  Polycystic kidneys on CT scan noted.  DVT prophylaxis:  SCDs Code Status:  Full code Family Communication:  Patient alone Disposition Plan:  Home with close follow up    Discharge Diagnoses:  Principal Problem:   Acute lower GI bleeding Active Problems:   Hypertension   Type II diabetes mellitus, uncontrolled ([place])   Hyperlipidemia with target LDL less than 100   Diverticulitis large intestine   Lower GI bleed  Discharge Instructions  Discharge Instructions    Increase activity slowly    Complete by:  As directed      Allergies as of 12/02/2016      Reactions   Norvasc [amlodipine Besylate] Swelling   ANGIOEDEMA   Nsaids Other (See Comments)   GI upset/ GI bleed      Medication List    TAKE these medications   ADVANCED COLLAGEN PO Take 1 scoop by mouth daily.   carvedilol 3.125 MG tablet Commonly known as:  COREG Take 3.125 mg by mouth 2 (two) times daily with a meal.   ciprofloxacin 500 MG tablet Commonly known as:  CIPRO Take 1 tablet (500 mg total) by mouth 2 (two) times daily.   ferrous sulfate 325 (65 FE) MG tablet Take 1 tablet (325 mg total) by mouth daily with breakfast.   hyoscyamine 0.125 MG SL tablet Commonly known as:  LEVSIN SL Place 1 tablet (0.125 mg total) under the tongue every 6 (six) hours as needed for cramping.   insulin glargine 100 UNIT/ML injection Commonly known as:  LANTUS Inject 45 Units into  the skin at bedtime.   INVOKANA 100 MG Tabs tablet Generic drug:  canagliflozin Take 100 mg by mouth daily.   losartan-hydrochlorothiazide 100-25 MG tablet Commonly known as:  HYZAAR TAKE 1 TABLET BY MOUTH DAILY. NEEDS OFFICE VISIT   Lutein-Zeaxanthin 20-1 MG Caps Take 1 capsule by mouth daily.   metFORMIN 1000 MG tablet Commonly known as:  GLUCOPHAGE Take 1 tablet (1,000 mg total) by mouth 2 (two) times daily with a meal. PATIENT NEEDS  OFFICE VISIT FOR ADDITIONAL REFILLS   metroNIDAZOLE 500 MG tablet Commonly known as:  FLAGYL Take 1 tablet (500 mg total) by mouth 2 (two) times daily with a meal. DO NOT CONSUME ALCOHOL WHILE TAKING THIS MEDICATION.   multivitamin with minerals tablet Take 1 tablet by mouth daily.   Omega 3 1000 MG Caps Take 1,000 mg by mouth daily.   pantoprazole 40 MG tablet Commonly known as:  PROTONIX TAKE 1 TABLET (40 MG TOTAL) BY MOUTH DAILY.   Turmeric 500 MG Tabs Take 1 tablet by mouth at bedtime.      Follow-up Information    SHAW,EVA, MD. Schedule an appointment as soon as possible for a visit in 1 week(s).   Specialty:  Family Medicine Contact information: [place] [id number]        Verl Blalock, MD. Schedule an appointment as soon as possible for a visit in 2 week(s).   Specialty:  Gastroenterology Why:  Hospital Follow Up          Allergies  Allergen Reactions  . Norvasc [Amlodipine Besylate] Swelling    ANGIOEDEMA  . Nsaids Other (See Comments)    GI upset/ GI bleed    Procedures/Studies: Ct Abdomen Pelvis Wo Contrast  Result Date: 11/30/2016 CLINICAL DATA:  Abdominal pain. History of diverticulosis and polycystic ovary syndrome. EXAM: CT ABDOMEN AND PELVIS WITHOUT CONTRAST TECHNIQUE: Multidetector CT imaging of the abdomen and pelvis was performed following the standard protocol without IV contrast. Oral contrast was administered. COMPARISON:  CT abdomen and pelvis June 05, 2016 FINDINGS: Lower chest: There is mild scarring in the posterior left lung base. No lung base edema or consolidation evident. Hepatobiliary: No focal liver lesions are evident on this noncontrast enhanced study. Gallbladder is absent. There is no appreciable biliary duct dilatation. Pancreas: No pancreatic mass or inflammatory focus. Spleen: No splenic lesions are evident. Adrenals/Urinary Tract: Right adrenal is absent. Left adrenal appears unremarkable. There  is a 2.3 x 2.3 cm cyst in the mid right kidney. There is a cyst in the anterior mid right kidney measuring 1.3 x 1.3 cm. There is a cyst arising in the lower pole the left kidney measuring 1.7 x 1.7 cm. There is no hydronephrosis on either side. There is no evident ureteral calculus or ureterectasis on either side. Urinary bladder is midline with wall thickness within normal limits. Stomach/Bowel: Rectum is mildly distended with stool. There are scattered diverticula throughout the colon without diverticulitis. There is no bowel wall or mesenteric thickening. There is no evident bowel obstruction. No free air or portal venous air. Vascular/Lymphatic: There is no abdominal aortic aneurysm. No vascular lesions are evident on this noncontrast enhanced study. No adenopathy is appreciable in the abdomen or pelvis. Reproductive: The uterus is anteverted. The uterus is enlarged with multiple lobulated partially calcified masses arising throughout the uterus consistent with diffuse leiomyomatous change. The largest individual masses noted in the anterior leftward aspect of the uterus measuring 5.5 x 6.3 x 5.5 cm. No pelvic masses  are identified apart from the uterus. Other: Appendix appears normal. There is no ascites or abscess in the abdomen or pelvis. Musculoskeletal: There is degenerative change in lumbar spine. There is spinal stenosis at L3-4 and L4-5 due to diffuse disc protrusion and bony hypertrophy. There are no blastic or lytic bone lesions. There is no intramuscular or abdominal wall lesion. 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 Colonic diverticula without diverticulitis. No bowel obstruction. No abscess. Appendix appears normal. Status post right adrenalectomy and cholecystectomy. No renal or ureteral calculus.  No hydronephrosis. 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 Electronically Signed   By: Lowella Grip III  M.D.   On: 11/30/2016 14:56    (Echo, Carotid, EGD, Colonoscopy, ERCP)    Subjective: Pt says that she is no longer having rectal bleeding and tolerating diet well.  No abd pain.   Discharge Exam: Vitals:   12/01/16 2035 12/02/16 0506  BP: (!) 100/53 130/70  Pulse: 85 86  Resp: 18 17  Temp: 98.5 F (36.9 C) 98.4 F (36.9 C)   Vitals:   12/01/16 0629 12/01/16 1435 12/01/16 2035 12/02/16 0506  BP: 140/81 111/74 (!) 100/53 130/70  Pulse: 84 91 85 86  Resp: 18 18 18 17   Temp: 98.8 F (37.1 C) 99.1 F (37.3 C) 98.5 F (36.9 C) 98.4 F (36.9 C)  TempSrc: Oral Oral Oral Oral  SpO2: 100% 98% 97% 96%  Weight:      Height:       General: Pt is alert, awake, not in acute distress Cardiovascular: RRR, S1/S2 +, no rubs, no gallops Respiratory: CTA bilaterally, no wheezing, no rhonchi Abdominal: Soft, NT, ND, bowel sounds + Extremities: no edema, no cyanosis  The results of significant diagnostics from this hospitalization (including imaging, microbiology, ancillary and laboratory) are listed below for reference.     Microbiology: Recent Results (from the past 240 hour(s))  Blood culture (routine x 2)     Status: None (Preliminary result)   Collection Time: 11/30/16  7:51 AM  Result Value Ref Range Status   Specimen Description BLOOD LEFT WRIST  Final   Special Requests BOTTLES DRAWN AEROBIC AND ANAEROBIC 5ML  Final   Culture   Final    NO GROWTH 1 DAY Performed at [facility], [address], [place], [place] [address]    Report Status PENDING  Incomplete  Blood culture (routine x 2)     Status: None (Preliminary result)   Collection Time: 11/30/16  7:59 AM  Result Value Ref Range Status   Specimen Description BLOOD LEFT ANTECUBITAL  Final   Special Requests BOTTLES DRAWN AEROBIC AND ANAEROBIC 5ML  Final   Culture   Final    NO GROWTH 1 DAY Performed at [facility], [place] [address], [place], [place] [address]    Report Status PENDING  Incomplete  Urine  culture     Status: Abnormal   Collection Time: 11/30/16 12:04 PM  Result Value Ref Range Status   Specimen Description URINE, RANDOM  Final   Special Requests NONE  Final   Culture MULTIPLE SPECIES PRESENT, SUGGEST RECOLLECTION (A)  Final   Report Status 12/01/2016 FINAL  Final  C difficile quick scan w PCR reflex     Status: None   Collection Time: 11/30/16  6:50 PM  Result Value Ref Range Status   C Diff antigen NEGATIVE NEGATIVE Final   C Diff toxin NEGATIVE NEGATIVE Final  C Diff interpretation No C. difficile detected.  Final     Labs: BNP (last 3 results) No results for input(s): BNP in the last 8760 hours. Basic Metabolic Panel:  Recent Labs Lab 11/30/16 0427 11/30/16 1045 12/01/16 0642  NA 137  --  138  K 4.0  --  3.7  CL 101  --  105  CO2 25  --  27  GLUCOSE 252*  --  118*  BUN 22*  --  12  CREATININE 1.14*  --  0.71  CALCIUM 9.2  --  8.5*  MG  --  1.6*  --   PHOS  --  3.6  --    Liver Function Tests:  Recent Labs Lab 11/30/16 0427  AST 32  ALT 39  ALKPHOS 53  BILITOT 0.5  PROT 8.3*  ALBUMIN 4.2   No results for input(s): LIPASE, AMYLASE in the last 168 hours. No results for input(s): AMMONIA in the last 168 hours. CBC:  Recent Labs Lab 11/30/16 0427 12/01/16 0642 12/01/16 1405 12/02/16 0944  WBC 13.5* 12.3* 12.0* 11.5*  HGB 11.9* 9.0* 8.4* 8.8*  HCT 37.7 28.4* 26.9* 28.5*  MCV 66.8* 66.8* 67.3* 67.1*  PLT 422* 340 348 390   Cardiac Enzymes: No results for input(s): CKTOTAL, CKMB, CKMBINDEX, TROPONINI in the last 168 hours. BNP: Invalid input(s): POCBNP CBG:  Recent Labs Lab 12/01/16 1124 12/01/16 1648 12/01/16 2040 12/02/16 0735 12/02/16 1150  GLUCAP 176* 145* 238* 146* 169*   D-Dimer No results for input(s): DDIMER in the last 72 hours. Hgb A1c  Recent Labs  11/30/16 1045  HGBA1C 7.5*   Lipid Profile No results for input(s): CHOL, HDL, LDLCALC, TRIG, CHOLHDL, LDLDIRECT in the last 72 hours. Thyroid function  studies  Recent Labs  11/30/16 1045  TSH 0.656   Anemia work up No results for input(s): VITAMINB12, FOLATE, FERRITIN, TIBC, IRON, RETICCTPCT in the last 72 hours. Urinalysis    Component Value Date/Time   COLORURINE YELLOW 11/30/2016 1204   APPEARANCEUR CLEAR 11/30/2016 1204   LABSPEC 1.025 11/30/2016 1204   PHURINE 5.0 11/30/2016 1204   GLUCOSEU >=500 (A) 11/30/2016 1204   HGBUR NEGATIVE 11/30/2016 [place] 11/30/2016 1204   BILIRUBINUR neg 08/25/2014 1157   KETONESUR NEGATIVE 11/30/2016 1204   PROTEINUR NEGATIVE 11/30/2016 1204   UROBILINOGEN 0.2 08/25/2014 1157   NITRITE NEGATIVE 11/30/2016 1204   LEUKOCYTESUR NEGATIVE 11/30/2016 1204   Sepsis Labs Invalid input(s): PROCALCITONIN,  WBC,  LACTICIDVEN Microbiology Recent Results (from the past 240 hour(s))  Blood culture (routine x 2)     Status: None (Preliminary result)   Collection Time: 11/30/16  7:51 AM  Result Value Ref Range Status   Specimen Description BLOOD LEFT WRIST  Final   Special Requests BOTTLES DRAWN AEROBIC AND ANAEROBIC 5ML  Final   Culture   Final    NO GROWTH 1 DAY Performed at [facility], [place] [address] [place], [place] [address]    Report Status PENDING  Incomplete  Blood culture (routine x 2)     Status: None (Preliminary result)   Collection Time: 11/30/16  7:59 AM  Result Value Ref Range Status   Specimen Description BLOOD LEFT ANTECUBITAL  Final   Special Requests BOTTLES DRAWN AEROBIC AND ANAEROBIC 5ML  Final   Culture   Final    NO GROWTH 1 DAY Performed at [facility], [place] [address], [place], [place] [address]    Report Status PENDING  Incomplete  Urine culture     Status: Abnormal   Collection Time: 11/30/16 12:04 PM  Result Value Ref Range Status   Specimen Description URINE, RANDOM  Final   Special Requests NONE  Final   Culture MULTIPLE SPECIES PRESENT, SUGGEST RECOLLECTION (A)  Final   Report Status 12/01/2016 FINAL  Final  C difficile  quick scan w PCR reflex     Status: None   Collection Time: 11/30/16  6:50 PM  Result Value Ref Range Status   C Diff antigen NEGATIVE NEGATIVE Final   C Diff toxin NEGATIVE NEGATIVE Final   C Diff interpretation No C. difficile detected.  Final   Time coordinating discharge: Over 30 minutes  SIGNED:  Irwin Brakeman, MD  [facility] 12/02/2016, 11:55 AM Pager   If 7PM-7AM, please contact night-coverage [email or web address] Password [email or web address]

## 2016-12-02 NOTE — Progress Notes (Signed)
DC instructions/medications reviewed with patient. No further questions. IVs removed. Pt calling uber.

## 2016-12-05 LAB — CULTURE, BLOOD (ROUTINE X 2)
CULTURE: NO GROWTH
Culture: NO GROWTH

## 2016-12-10 ENCOUNTER — Ambulatory Visit (INDEPENDENT_AMBULATORY_CARE_PROVIDER_SITE_OTHER): Payer: Managed Care, Other (non HMO) | Admitting: Gastroenterology

## 2016-12-10 ENCOUNTER — Encounter: Payer: Self-pay | Admitting: Gastroenterology

## 2016-12-10 ENCOUNTER — Other Ambulatory Visit: Payer: Managed Care, Other (non HMO)

## 2016-12-10 ENCOUNTER — Other Ambulatory Visit (INDEPENDENT_AMBULATORY_CARE_PROVIDER_SITE_OTHER): Payer: Managed Care, Other (non HMO)

## 2016-12-10 VITALS — BP 126/76 | HR 96 | Ht 62.75 in | Wt 243.5 lb

## 2016-12-10 DIAGNOSIS — R109 Unspecified abdominal pain: Secondary | ICD-10-CM

## 2016-12-10 DIAGNOSIS — R197 Diarrhea, unspecified: Secondary | ICD-10-CM

## 2016-12-10 DIAGNOSIS — R7309 Other abnormal glucose: Secondary | ICD-10-CM | POA: Diagnosis not present

## 2016-12-10 DIAGNOSIS — K922 Gastrointestinal hemorrhage, unspecified: Secondary | ICD-10-CM

## 2016-12-10 LAB — CBC WITH DIFFERENTIAL/PLATELET
BASOS ABS: 0 10*3/uL (ref 0.0–0.1)
BASOS PCT: 0.3 % (ref 0.0–3.0)
EOS ABS: 0.6 10*3/uL (ref 0.0–0.7)
Eosinophils Relative: 4.6 % (ref 0.0–5.0)
HCT: 32.8 % — ABNORMAL LOW (ref 36.0–46.0)
Hemoglobin: 10.4 g/dL — ABNORMAL LOW (ref 12.0–15.0)
LYMPHS ABS: 4.6 10*3/uL — AB (ref 0.7–4.0)
LYMPHS PCT: 37.6 % (ref 12.0–46.0)
MCHC: 31.6 g/dL (ref 30.0–36.0)
MCV: 68.4 fl — ABNORMAL LOW (ref 78.0–100.0)
MONO ABS: 0.6 10*3/uL (ref 0.1–1.0)
Monocytes Relative: 5.2 % (ref 3.0–12.0)
NEUTROS ABS: 6.4 10*3/uL (ref 1.4–7.7)
NEUTROS PCT: 52.3 % (ref 43.0–77.0)
PLATELETS: 593 10*3/uL — AB (ref 150.0–400.0)
RBC: 4.8 Mil/uL (ref 3.87–5.11)
RDW: 20.4 % — AB (ref 11.5–15.5)
WBC: 12.1 10*3/uL — ABNORMAL HIGH (ref 4.0–10.5)

## 2016-12-10 MED ORDER — DICYCLOMINE HCL 20 MG PO TABS: 20.0000 mg | ORAL_TABLET | Freq: Two times a day (BID) | ORAL | 1 refills | Status: DC

## 2016-12-10 NOTE — Patient Instructions (Addendum)
We have sent the following medications to your pharmacy for you to pick up at your convenience:  Dicyclomine  Use Florastor twice a day  Call back in two weeks with an update on symptoms

## 2016-12-16 ENCOUNTER — Encounter: Payer: Self-pay | Admitting: Gastroenterology

## 2016-12-16 DIAGNOSIS — R197 Diarrhea, unspecified: Secondary | ICD-10-CM | POA: Insufficient documentation

## 2016-12-16 NOTE — Progress Notes (Signed)
I agree with the above note, plan 

## 2016-12-16 NOTE — Progress Notes (Signed)
12/16/2016 Carol Mueller [id number] 02-18-1961   HISTORY OF PRESENT ILLNESS:  This is a 56 year old female with issues of recurrent LGIB.  Thought to have an ischemic colitis episode in 07/2015 then diverticular bleed in 05/2016.  Recently admitted with another bout of bleeding earlier this month, presumed diverticular vs mild ischemia not seen on non-contrast CT scan.  Is here for hospital follow-up.  Was in the hospital for 2 days on this occasion, bleeding resolved.  No further bleeding but has some intermittent left-sided discomfort.  Admits to diarrhea/loose stools quite regularly.  Last colonoscopy was 12/2012 by Dr. Sharlett Iles at which time she was found to have moderate diverticulosis in the descending and sigmoid colon.     Past Medical History:  Diagnosis Date  . Anemia   . 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 Diabetes mellitus without complication ([facility]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  . Diverticulitis   . Diverticulosis   . GI bleed   . Hyperlipidemia   . Hypertension   . Polycystic ovary disease    Past Surgical History:  Procedure Laterality Date  . ADRENALECTOMY  1996  . CHOLECYSTECTOMY    . ESOPHAGOGASTRODUODENOSCOPY (EGD) WITH PROPOFOL N/A 07/16/2015   Procedure: ESOPHAGOGASTRODUODENOSCOPY (EGD) WITH PROPOFOL;  Surgeon: [name], MD;  Location: [facility];  Service: Endoscopy;  Laterality: N/A;    reports that she has never smoked. She has never used smokeless tobacco. She reports that she does not drink alcohol or use drugs. family history includes Breast cancer in her maternal grandmother; Cancer in her mother; Diabetes in her brother, father, paternal grandmother, and sister; Hypertension in her brother, father, maternal grandfather, mother, and sister. Allergies  Allergen Reactions  . Norvasc [Amlodipine Besylate] Swelling    ANGIOEDEMA  . Nsaids Other (See Comments)    GI upset/ GI bleed      Outpatient Encounter Prescriptions as of  12/10/2016  Medication Sig  . Canagliflozin (INVOKANA) 100 MG TABS Take 100 mg by mouth daily.   . carvedilol (COREG) 3.125 MG tablet Take 3.125 mg by mouth 2 (two) times daily with a meal.  . ferrous sulfate 325 (65 FE) MG tablet Take 1 tablet (325 mg total) by mouth daily with breakfast.  . insulin glargine (LANTUS) 100 UNIT/ML injection Inject 45 Units into the skin at bedtime.  Marland Kitchen losartan-hydrochlorothiazide (HYZAAR) 100-25 MG per tablet TAKE 1 TABLET BY MOUTH DAILY. NEEDS OFFICE VISIT  . Lutein-Zeaxanthin 20-1 MG CAPS Take 1 capsule by mouth daily.  . metFORMIN (GLUCOPHAGE) 1000 MG tablet Take 1 tablet (1,000 mg total) by mouth 2 (two) times daily with a meal. PATIENT NEEDS OFFICE VISIT FOR ADDITIONAL REFILLS  . Multiple Vitamins-Minerals (MULTIVITAMIN WITH MINERALS) tablet Take 1 tablet by mouth daily.  . Omega 3 1000 MG CAPS Take 1,000 mg by mouth daily.  Marland Kitchen Specialty Vitamins Products (ADVANCED COLLAGEN PO) Take 1 scoop by mouth daily.  . Turmeric 500 MG TABS Take 1 tablet by mouth at bedtime.  . dicyclomine (BENTYL) 20 MG tablet Take 1 tablet (20 mg total) by mouth 2 (two) times daily.  . [DISCONTINUED] hyoscyamine (LEVSIN SL) 0.125 MG SL tablet Place 1 tablet (0.125 mg total) under the tongue every 6 (six) hours as needed for cramping. (Patient not taking: Reported on 11/30/2016)  . [DISCONTINUED] pantoprazole (PROTONIX) 40 MG tablet TAKE 1 TABLET (40 MG TOTAL) BY MOUTH DAILY. (Patient not taking: Reported on 11/30/2016)   No facility-administered encounter  medications on file as of 12/10/2016.      REVIEW OF SYSTEMS  : All other systems reviewed and negative except where noted in the History of Present Illness.   PHYSICAL EXAM: BP 126/76 (BP Location: Left Arm, Patient Position: Sitting, Cuff Size: Large)   Pulse 96   Ht 5' 2.75" (1.594 m) Comment: height measured without shoes  Wt 243 lb 8 oz (110.5 kg)   BMI 43.48 kg/m  General: Well developed AA female in no acute  distress Head: Normocephalic and atraumatic Eyes:  Sclerae anicteric, conjunctiva pink. Ears: Normal auditory acuity Lungs: Clear throughout to auscultation Heart: Regular rate and rhythm Abdomen: Soft, non-distended.  Normal bowel sounds.  Non-tender. Musculoskeletal: Symmetrical with no gross deformities  Skin: No lesions on visible extremities Extremities: No edema  Neurological: Alert oriented x 4, grossly non-focal Psychological:  Alert and cooperative. Normal mood and affect  ASSESSMENT AND PLAN: -56 year old female with issues of recurrent LGIB.  Thought to have an ischemic colitis episode in 07/2015 then diverticular bleed in 05/2016.  Recently admitted with another bout of bleeding earlier this month, presumed diverticular vs mild ischemia not seen on non-contrast CT scan.  No further bleeding but has some intermittent left-sided discomfort.  Will try dicyclomine 20 mg BID for spasm.  Will repeat CBC today. -Intermittent diarrhea:  Has loose stools quite regularly.  May be due to metformin.  Will start Florastor twice a day and discuss medication change with her PCP.  *Wil call back in 2 weeks with an update on her symptoms.  May need to consider repeat colonoscopy due to these recurrent bleeding episodes and diarrhea/loose stools.  CC:  Shawnee Knapp, MD

## 2016-12-17 ENCOUNTER — Other Ambulatory Visit: Payer: Self-pay

## 2016-12-17 DIAGNOSIS — D649 Anemia, unspecified: Secondary | ICD-10-CM

## 2016-12-21 ENCOUNTER — Other Ambulatory Visit (INDEPENDENT_AMBULATORY_CARE_PROVIDER_SITE_OTHER): Payer: Managed Care, Other (non HMO)

## 2016-12-21 DIAGNOSIS — D649 Anemia, unspecified: Secondary | ICD-10-CM | POA: Diagnosis not present

## 2016-12-21 LAB — CBC WITH DIFFERENTIAL/PLATELET
BASOS ABS: 0.1 10*3/uL (ref 0.0–0.1)
Basophils Relative: 0.5 % (ref 0.0–3.0)
EOS ABS: 0.7 10*3/uL (ref 0.0–0.7)
Eosinophils Relative: 6.3 % — ABNORMAL HIGH (ref 0.0–5.0)
HCT: 36.6 % (ref 36.0–46.0)
Hemoglobin: 11.4 g/dL — ABNORMAL LOW (ref 12.0–15.0)
LYMPHS PCT: 39.1 % (ref 12.0–46.0)
Lymphs Abs: 4.1 10*3/uL — ABNORMAL HIGH (ref 0.7–4.0)
MCHC: 31.3 g/dL (ref 30.0–36.0)
MCV: 70.2 fl — ABNORMAL LOW (ref 78.0–100.0)
MONOS PCT: 8.4 % (ref 3.0–12.0)
Monocytes Absolute: 0.9 10*3/uL (ref 0.1–1.0)
NEUTROS PCT: 45.7 % (ref 43.0–77.0)
Neutro Abs: 4.8 10*3/uL (ref 1.4–7.7)
Platelets: 414 10*3/uL — ABNORMAL HIGH (ref 150.0–400.0)
RBC: 5.21 Mil/uL — AB (ref 3.87–5.11)
RDW: 21.2 % — AB (ref 11.5–15.5)
WBC: 10.5 10*3/uL (ref 4.0–10.5)

## 2017-02-08 ENCOUNTER — Other Ambulatory Visit (INDEPENDENT_AMBULATORY_CARE_PROVIDER_SITE_OTHER): Payer: 59

## 2017-02-08 ENCOUNTER — Ambulatory Visit (INDEPENDENT_AMBULATORY_CARE_PROVIDER_SITE_OTHER): Payer: 59 | Admitting: Gastroenterology

## 2017-02-08 ENCOUNTER — Encounter: Payer: Self-pay | Admitting: Gastroenterology

## 2017-02-08 ENCOUNTER — Encounter (INDEPENDENT_AMBULATORY_CARE_PROVIDER_SITE_OTHER): Payer: Self-pay

## 2017-02-08 VITALS — BP 120/78 | HR 82 | Ht 63.5 in | Wt 247.0 lb

## 2017-02-08 DIAGNOSIS — K922 Gastrointestinal hemorrhage, unspecified: Secondary | ICD-10-CM | POA: Diagnosis not present

## 2017-02-08 DIAGNOSIS — R109 Unspecified abdominal pain: Secondary | ICD-10-CM | POA: Diagnosis not present

## 2017-02-08 LAB — CBC WITH DIFFERENTIAL/PLATELET
BASOS ABS: 0.1 10*3/uL (ref 0.0–0.1)
Basophils Relative: 0.7 % (ref 0.0–3.0)
EOS PCT: 6.6 % — AB (ref 0.0–5.0)
Eosinophils Absolute: 0.8 10*3/uL — ABNORMAL HIGH (ref 0.0–0.7)
HCT: 41.8 % (ref 36.0–46.0)
HEMOGLOBIN: 13.2 g/dL (ref 12.0–15.0)
LYMPHS PCT: 35.3 % (ref 12.0–46.0)
Lymphs Abs: 4.4 10*3/uL — ABNORMAL HIGH (ref 0.7–4.0)
MCHC: 31.6 g/dL (ref 30.0–36.0)
MCV: 69 fl — ABNORMAL LOW (ref 78.0–100.0)
MONOS PCT: 8.5 % (ref 3.0–12.0)
Monocytes Absolute: 1.1 10*3/uL — ABNORMAL HIGH (ref 0.1–1.0)
NEUTROS ABS: 6.1 10*3/uL (ref 1.4–7.7)
NEUTROS PCT: 48.9 % (ref 43.0–77.0)
Platelets: 449 10*3/uL — ABNORMAL HIGH (ref 150.0–400.0)
RBC: 6.06 Mil/uL — ABNORMAL HIGH (ref 3.87–5.11)
RDW: 16.5 % — ABNORMAL HIGH (ref 11.5–15.5)
WBC: 12.5 10*3/uL — ABNORMAL HIGH (ref 4.0–10.5)

## 2017-02-08 NOTE — Patient Instructions (Addendum)
You will have labs checked today in the basement lab.  Please head down after you check out with the front desk  (cbc). Call if any problems, bleeding or severe pains.  If you are age 57 or older, your body mass index should be between 23-30. Your Body mass index is 43.07 kg/m. If this is out of the aforementioned range listed, please consider follow up with your Primary Care Provider.  If you are age 14 or younger, your body mass index should be between 19-25. Your Body mass index is 43.07 kg/m. If this is out of the aformentioned range listed, please consider follow up with your Primary Care Provider.

## 2017-02-08 NOTE — Progress Notes (Signed)
Review of pertinent gastrointestinal problems: 1. Routine risk for colon cancer, colonoscopy Dr. Verl Blalock 12/2012 found diverticulosis only. He recommended repeat colon cancer screening with colonoscopy at 10 year interval. 2. "Acute diverticular bleeding" in proximal descending colonJuly 2017. This was documented on CT angiogram during her bleeding episode.  She went straight to angiogram by interventional radiology and there was no active bleeding at the time of that exam. Also overt GI bleeding episode 07/2015: did not need blood transfusion then.   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  Also intermittent generalized abdominal pains  Chief complaint is 3 episodes of overt GI bleeding  Better with bentyl.  Still gets migratory abdominal pains that can last days.  Diarrhea much improved.  No overt bleeding.  She stopped iron about month ago.  Overall stable weight.  No colon cancer in her family.  CBC 2 months ago when she was here seeing Jessica hemoglobin was 11.8  ROS: complete GI ROS as described in HPI.  Constitutional:  No unintentional weight loss   Past Medical History:  Diagnosis Date  . Anemia   . Cushing's syndrome ([place])    due to adrenal tumor  . Diabetes mellitus without complication (HCC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  . Diverticulitis   . Diverticulosis   . GI bleed   . Hyperlipidemia   . Hypertension   . Polycystic ovary disease     Past Surgical History:  Procedure Laterality Date  . ADRENALECTOMY  1996  . CHOLECYSTECTOMY    . ESOPHAGOGASTRODUODENOSCOPY (EGD) WITH PROPOFOL N/A 07/16/2015   Procedure: ESOPHAGOGASTRODUODENOSCOPY (EGD) WITH PROPOFOL;  Surgeon: [name], MD;  Location: [facility];  Service: Endoscopy;  Laterality: N/A;    Current Outpatient Prescriptions  Medication Sig Dispense Refill  . Canagliflozin (INVOKANA) 100 MG TABS Take 100 mg by mouth daily.     . carvedilol (COREG) 3.125 MG  tablet Take 3.125 mg by mouth 2 (two) times daily with a meal.    . dicyclomine (BENTYL) 20 MG tablet Take 1 tablet (20 mg total) by mouth 2 (two) times daily. 60 tablet 1  . ferrous sulfate 325 (65 FE) MG tablet Take 1 tablet (325 mg total) by mouth daily with breakfast. 30 tablet 0  . insulin glargine (LANTUS) 100 UNIT/ML injection Inject 45 Units into the skin at bedtime.    Marland Kitchen losartan-hydrochlorothiazide (HYZAAR) 100-25 MG per tablet TAKE 1 TABLET BY MOUTH DAILY. NEEDS OFFICE VISIT 30 tablet 0  . Lutein-Zeaxanthin 20-1 MG CAPS Take 1 capsule by mouth daily.    . metFORMIN (GLUCOPHAGE) 1000 MG tablet Take 1 tablet (1,000 mg total) by mouth 2 (two) times daily with a meal. PATIENT NEEDS OFFICE VISIT FOR ADDITIONAL REFILLS 60 tablet 0  . Multiple Vitamins-Minerals (MULTIVITAMIN WITH MINERALS) tablet Take 1 tablet by mouth daily.    . Omega 3 1000 MG CAPS Take 1,000 mg by mouth daily.    Marland Kitchen Specialty Vitamins Products (ADVANCED COLLAGEN PO) Take 1 scoop by mouth daily.    . Turmeric 500 MG TABS Take 1 tablet by mouth at bedtime.     No current facility-administered medications for this visit.     Allergies as of 02/08/2017 - Review Complete 02/08/2017  Allergen Reaction Noted  . Norvasc [amlodipine besylate] Swelling 02/22/2012  . Nsaids Other (See Comments) 08/07/2015    Family History  Problem Relation Age of Onset  . Hypertension Mother   . Cancer Mother     ?  uterine or ovarian cancer  . Hypertension Father   . Diabetes Father   . Hypertension Sister   . Diabetes Sister   . Hypertension Brother   . Diabetes Brother   . Hypertension Maternal Grandfather   . Diabetes Paternal Grandmother   . Breast cancer Maternal Grandmother   . Colon cancer Neg Hx     Social History   Social History  . Marital status: Single    Spouse name: N/A  . Number of children: 0  . Years of education: N/A   Occupational History  . NURSE     [employer]   Social History Main Topics  . Smoking  status: Never Smoker  . Smokeless tobacco: Never Used  . Alcohol use No  . Drug use: No  . Sexual activity: No   Other Topics Concern  . Not on file   Social History Narrative   Lives alone. Parents live in [place].     Physical Exam: BP 120/78 Comment: large cuff  Pulse 82   Ht 5' 3.5" (1.613 m)   Wt 247 lb (112 kg)   BMI 43.07 kg/m  Constitutional: generally well-appearing Psychiatric: alert and oriented x3 Abdomen: soft, nontender, nondistended, no obvious ascites, no peritoneal signs, normal bowel sounds No peripheral edema noted in lower extremities  Assessment and plan: 56 y.o. female with recurrent lower GI bleeding  I do think most likely she has been having intermittent diverticular bleeds. She has never bled enough to require even a single unit blood transfusion. We discussed possibly repeating colonoscopy now to restage her diverticular disease and exclude other potential causes like missed neoplasm. In the end we agreed to hold on that since I don't think it's going to change management I think it is very unlikely she has underlying cancer. She can continue with Bentyl on a when necessary basis. We'll recheck her CBC today. She knows to call here she has significant bleeding again or significant worsening of abdominal pains.  Please see the "Patient Instructions" section for addition details about the plan.  Owens Loffler, MD [facility] 02/08/2017, 3:34 PM

## 2017-02-20 ENCOUNTER — Other Ambulatory Visit: Payer: Self-pay | Admitting: Gastroenterology

## 2017-03-30 ENCOUNTER — Other Ambulatory Visit: Payer: Self-pay | Admitting: Gastroenterology

## 2017-04-29 ENCOUNTER — Other Ambulatory Visit: Payer: Self-pay | Admitting: Gastroenterology

## 2017-06-02 ENCOUNTER — Other Ambulatory Visit: Payer: Self-pay | Admitting: Gastroenterology

## 2017-08-23 ENCOUNTER — Ambulatory Visit (INDEPENDENT_AMBULATORY_CARE_PROVIDER_SITE_OTHER): Payer: 59 | Admitting: Physician Assistant

## 2017-08-23 ENCOUNTER — Encounter: Payer: Self-pay | Admitting: Physician Assistant

## 2017-08-23 VITALS — BP 140/85 | HR 94 | Temp 99.9°F | Resp 17 | Ht 63.0 in | Wt 251.0 lb

## 2017-08-23 DIAGNOSIS — M5432 Sciatica, left side: Secondary | ICD-10-CM

## 2017-08-23 MED ORDER — TRAMADOL HCL 50 MG PO TABS: 50.0000 mg | ORAL_TABLET | Freq: Three times a day (TID) | ORAL | 0 refills | Status: DC | PRN

## 2017-08-23 MED ORDER — HYDROCODONE-ACETAMINOPHEN 5-325 MG PO TABS: 1.0000 | ORAL_TABLET | Freq: Four times a day (QID) | ORAL | 0 refills | Status: DC | PRN

## 2017-08-23 MED ORDER — CYCLOBENZAPRINE HCL 10 MG PO TABS: 5.0000 mg | ORAL_TABLET | Freq: Three times a day (TID) | ORAL | 0 refills | Status: DC | PRN

## 2017-08-23 NOTE — Patient Instructions (Signed)
     IF you received an x-ray today, you will receive an invoice from [facility]. Please contact [facility] at [phone] with questions or concerns regarding your invoice.   IF you received labwork today, you will receive an invoice from [facility]. Please contact [facility] at [phone] with questions or concerns regarding your invoice.   Our billing staff will not be able to assist you with questions regarding bills from these companies.  You will be contacted with the lab results as soon as they are available. The fastest way to get your results is to activate your My Chart account. Instructions are located on the last page of this paperwork. If you have not heard from us regarding the results in 2 weeks, please contact this office.     

## 2017-08-23 NOTE — Progress Notes (Signed)
    08/23/2017 4:27 PM   DOB: 1961-02-20 / MRN: [id number]  SUBJECTIVE:  Carol Mueller is a 56 y.o. female presenting for hip pain that started last night after waking up from sleeping cross legged in the recliner.  Associates lower lumbar pain as well. She feels that hs is getting worse.  Has tried 1000 mg of tylneol, flexeril, and a Vicodin and this "did not touch the pain." Has a history of cervical DJD.  No formal diagnosis of hip or lumbar arthritis, but does have a CT scan from 2016 with some evidence of DDD.   She is allergic to norvasc [amlodipine besylate] and nsaids.   She  has a past medical history of Anemia; Cushing's syndrome ([place]); Diabetes mellitus without complication ([place]); Diverticulitis; Diverticulosis; GI bleed; Hyperlipidemia; Hypertension; and Polycystic ovary disease.    She  reports that she has never smoked. She has never used smokeless tobacco. She reports that she does not drink alcohol or use drugs. She  reports that she does not engage in sexual activity. The patient  has a past surgical history that includes Cholecystectomy; Adrenalectomy (1996); and Esophagogastroduodenoscopy (egd) with propofol (N/A, 07/16/2015).  Her family history includes Breast cancer in her maternal grandmother; Cancer in her mother; Diabetes in her brother, father, paternal grandmother, and sister; Hypertension in her brother, father, maternal grandfather, mother, and sister.  Review of Systems  Musculoskeletal: Positive for back pain and joint pain. Negative for falls, myalgias and neck pain.  Neurological: Negative for dizziness.    The problem list and medications were reviewed and updated by myself where necessary and exist elsewhere in the encounter.   OBJECTIVE:  BP 140/85   Pulse 94   Temp 99.9 F (37.7 C) (Oral)   Resp 17   Ht 5\' 3"  (1.6 m)   Wt 251 lb (113.9 kg)   SpO2 98%   BMI 44.46 kg/m   Lab Results  Component Value Date   TSH 0.656 11/30/2016    Lab Results    Component Value Date   HGBA1C 7.5 (H) 11/30/2016     Physical Exam  Constitutional: She is active.  Cardiovascular: Normal rate.   Pulmonary/Chest: Effort normal. No tachypnea.  Musculoskeletal: She exhibits tenderness (borderline exquisite about the left sciatic notch, negative for rash about this area.  Hip ROM normal .  Acetabular grind is negative. ).  Neurological: She is alert.  Skin: Skin is warm.    No results found for this or any previous visit (from the past 72 hour(s)).  No results found.  ASSESSMENT AND PLAN:  Ionia was seen today for hip pain and back pain.  Diagnoses and all orders for this visit:  Sciatica of left side: She has a history of GI bleed 2/2 NSAIDS as well as diabetes on insulin.  Advised baseline tylenol 1000 mg every 8 plus flexceril.  If  That fails then add tramadol.  Narcotic for night time if needed. Given my treatment limitations will go ahead and initiate a sports medicine referral.     The patient is advised to call or return to clinic if she does not see an improvement in symptoms, or to seek the care of the closest emergency department if she worsens with the above plan.   Philis Fendt, MHS, PA-C [facility] 08/23/2017 4:27 PM

## 2017-09-17 ENCOUNTER — Telehealth: Payer: Self-pay | Admitting: Physician Assistant

## 2017-09-17 NOTE — Telephone Encounter (Signed)
Pt called about referral that was placed for sports meds stated that she has not been contacted.. Left a message for pt that we sent her referral to [facility] [place] and gave pt the number

## 2017-09-29 ENCOUNTER — Ambulatory Visit (INDEPENDENT_AMBULATORY_CARE_PROVIDER_SITE_OTHER): Payer: 59 | Admitting: Family Medicine

## 2017-09-29 ENCOUNTER — Encounter: Payer: Self-pay | Admitting: Family Medicine

## 2017-09-29 VITALS — BP 138/88 | Ht 63.5 in | Wt 240.0 lb

## 2017-09-29 DIAGNOSIS — M5410 Radiculopathy, site unspecified: Secondary | ICD-10-CM

## 2017-09-29 DIAGNOSIS — M545 Low back pain: Secondary | ICD-10-CM

## 2017-09-29 NOTE — Progress Notes (Signed)
   HPI  CC: Low back pain Patient is here with complaints of gradually worsening low back pain and left-sided hip pain over the past 1 month.  She states that she has been dealing with worsening pain without obvious cause during this time.  Pain is described as sharp and aching in nature.  It begins within the left low back and buttock and extends along the posterior aspect of the thigh with some extension into the anterior thigh as well.  She denies any trauma, injury, or falls that may have caused this pain.  No specific activity that exacerbates this pain.  She endorses some change in her gait that may have caused her left ankle to begin to ache recently.  She denies distal numbness, weakness, or paresthesias, to the best of her knowledge.  But she does endorse "giving out" of her left ankle over the past week.  She denies fevers or chills.  No bowel or bladder incontinence.  Traumatic: No  Location: Left lower back and thigh Quality: Sharp achy dull  Duration: 1 month Timing: Constant, worse with standing up from seated position Improving/Worsening: Worsening Makes better: Rest Makes worse: Prolonged standing Associated symptoms: None  Previous Interventions Tried: None  Past Injuries: None Past Surgeries: Noncontributory Smoking: None Family Hx: Noncontributory  ROS: Per HPI; in addition no fever, no rash, no additional weakness, no additional numbness, no additional paresthesias, and no additional falls/injury.   Objective: BP 138/88   Ht 5' 3.5" (1.613 m)   Wt 240 lb (108.9 kg)   BMI 41.85 kg/m  Gen: NAD, well groomed, a/o x3, normal affect.  CV: Well-perfused. Warm.  Resp: Non-labored.  Neuro: Sensation intact throughout. No gross coordination deficits.  Gait: Nonpathologic posture, unremarkable stride without signs of limp or balance issues. Back: Inspection yielded no evidence of erythema, ecchymosis, or rash.  Tenderness primarily over the left buttock and piriformis  with mild extension towards the SI joint.  Range of motion intact.  Strength is 4/5 with knee flexion and extension of the left side versus 5/5 on the right side.  DTRs +1 bilaterally.  Sensation intact throughout.  Assessment and Plan:  Back pain with left-sided radiculopathy Patient is here with signs and symptoms consistent with radicular pain.  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  Patient has extremely concerning weakness of the left leg.  I am concerned that she has substantial nerve entrapment at these levels. -I offered a prednisone Dosepak however patient states that she does not want to take this due to her history of adrenal insufficiency and diabetes. -Patient is not a good candidate for NSAIDs. -Referral to orthopedic spinal surgery placed. -Follow-up as needed   Orders Placed This Encounter  Procedures  . Ambulatory referral to Orthopedic Surgery    Referral Priority:   Routine    Referral Type:   Surgical    Referral Reason:   Specialty Services Required    Referred to Provider:   Phylliss Bob, MD    Requested Specialty:   Orthopedic Surgery    Number of Visits Requested:   [place], MD,MS [facility] 09/29/2017 6:17 PM

## 2017-09-29 NOTE — Assessment & Plan Note (Signed)
Patient is here with signs and symptoms consistent with radicular pain.  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  Patient has extremely concerning weakness of the left leg.  I am concerned that she has substantial nerve entrapment at these levels. -I offered a prednisone Dosepak however patient states that she does not want to take this due to her history of adrenal insufficiency and diabetes. -Patient is not a good candidate for NSAIDs. -Referral to orthopedic spinal surgery placed. -Follow-up as needed

## 2017-10-01 ENCOUNTER — Other Ambulatory Visit: Payer: Self-pay | Admitting: Family Medicine

## 2017-10-01 DIAGNOSIS — Z139 Encounter for screening, unspecified: Secondary | ICD-10-CM

## 2017-11-01 ENCOUNTER — Ambulatory Visit
Admission: RE | Admit: 2017-11-01 | Discharge: 2017-11-01 | Disposition: A | Discharge status: Home / Self Care | Payer: 59 | Source: Ambulatory Visit | Attending: Family Medicine | Admitting: Family Medicine

## 2017-11-01 DIAGNOSIS — Z139 Encounter for screening, unspecified: Secondary | ICD-10-CM

## 2017-11-02 ENCOUNTER — Other Ambulatory Visit: Payer: Self-pay | Admitting: Family Medicine

## 2017-11-02 DIAGNOSIS — R928 Other abnormal and inconclusive findings on diagnostic imaging of breast: Secondary | ICD-10-CM

## 2017-11-04 ENCOUNTER — Other Ambulatory Visit: Payer: Self-pay | Admitting: Family Medicine

## 2017-11-04 ENCOUNTER — Ambulatory Visit
Admission: RE | Admit: 2017-11-04 | Discharge: 2017-11-04 | Disposition: A | Discharge status: Home / Self Care | Payer: 59 | Source: Ambulatory Visit | Attending: Family Medicine | Admitting: Family Medicine

## 2017-11-04 DIAGNOSIS — R921 Mammographic calcification found on diagnostic imaging of breast: Secondary | ICD-10-CM

## 2017-11-04 DIAGNOSIS — R928 Other abnormal and inconclusive findings on diagnostic imaging of breast: Secondary | ICD-10-CM

## 2017-11-11 ENCOUNTER — Ambulatory Visit
Admission: RE | Admit: 2017-11-11 | Discharge: 2017-11-11 | Disposition: A | Discharge status: Home / Self Care | Payer: 59 | Source: Ambulatory Visit | Attending: Family Medicine | Admitting: Family Medicine

## 2017-11-11 DIAGNOSIS — R921 Mammographic calcification found on diagnostic imaging of breast: Secondary | ICD-10-CM

## 2018-06-02 ENCOUNTER — Emergency Department (HOSPITAL_COMMUNITY): Payer: 59

## 2018-06-02 ENCOUNTER — Observation Stay (HOSPITAL_COMMUNITY)
Admission: EM | Admit: 2018-06-02 | Discharge: 2018-06-03 | Disposition: A | Discharge status: Home / Self Care | Payer: 59 | Attending: Family Medicine | Admitting: Family Medicine

## 2018-06-02 ENCOUNTER — Other Ambulatory Visit: Payer: Self-pay

## 2018-06-02 ENCOUNTER — Encounter (HOSPITAL_COMMUNITY): Payer: Self-pay

## 2018-06-02 ENCOUNTER — Telehealth: Payer: Self-pay | Admitting: Gastroenterology

## 2018-06-02 DIAGNOSIS — E248 Other Cushing's syndrome: Secondary | ICD-10-CM | POA: Diagnosis not present

## 2018-06-02 DIAGNOSIS — Z794 Long term (current) use of insulin: Secondary | ICD-10-CM | POA: Diagnosis not present

## 2018-06-02 DIAGNOSIS — I10 Essential (primary) hypertension: Secondary | ICD-10-CM | POA: Diagnosis not present

## 2018-06-02 DIAGNOSIS — E876 Hypokalemia: Secondary | ICD-10-CM | POA: Diagnosis not present

## 2018-06-02 DIAGNOSIS — D649 Anemia, unspecified: Secondary | ICD-10-CM | POA: Diagnosis not present

## 2018-06-02 DIAGNOSIS — Z79891 Long term (current) use of opiate analgesic: Secondary | ICD-10-CM | POA: Diagnosis not present

## 2018-06-02 DIAGNOSIS — K625 Hemorrhage of anus and rectum: Principal | ICD-10-CM | POA: Insufficient documentation

## 2018-06-02 DIAGNOSIS — E1159 Type 2 diabetes mellitus with other circulatory complications: Secondary | ICD-10-CM | POA: Diagnosis present

## 2018-06-02 DIAGNOSIS — K922 Gastrointestinal hemorrhage, unspecified: Secondary | ICD-10-CM | POA: Diagnosis present

## 2018-06-02 DIAGNOSIS — Z79899 Other long term (current) drug therapy: Secondary | ICD-10-CM | POA: Insufficient documentation

## 2018-06-02 DIAGNOSIS — IMO0002 Reserved for concepts with insufficient information to code with codable children: Secondary | ICD-10-CM | POA: Diagnosis present

## 2018-06-02 DIAGNOSIS — E1165 Type 2 diabetes mellitus with hyperglycemia: Secondary | ICD-10-CM | POA: Diagnosis present

## 2018-06-02 DIAGNOSIS — E119 Type 2 diabetes mellitus without complications: Secondary | ICD-10-CM | POA: Diagnosis not present

## 2018-06-02 LAB — COMPREHENSIVE METABOLIC PANEL
ALK PHOS: 59 U/L (ref 38–126)
ALT: 61 U/L — ABNORMAL HIGH (ref 0–44)
ANION GAP: 10 (ref 5–15)
AST: 43 U/L — ABNORMAL HIGH (ref 15–41)
Albumin: 3.7 g/dL (ref 3.5–5.0)
BUN: 10 mg/dL (ref 6–20)
CO2: 26 mmol/L (ref 22–32)
Calcium: 8.9 mg/dL (ref 8.9–10.3)
Chloride: 99 mmol/L (ref 98–111)
Creatinine, Ser: 0.75 mg/dL (ref 0.44–1.00)
GFR calc non Af Amer: >60 mL/min (ref >60)
Glucose, Bld: 130 mg/dL — ABNORMAL HIGH (ref 70–99)
Potassium: 3.1 mmol/L — ABNORMAL LOW (ref 3.5–5.1)
SODIUM: 135 mmol/L (ref 135–145)
Total Bilirubin: 0.5 mg/dL (ref 0.3–1.2)
Total Protein: 7.6 g/dL (ref 6.5–8.1)

## 2018-06-02 LAB — URINALYSIS, ROUTINE W REFLEX MICROSCOPIC
Bilirubin Urine: NEGATIVE
Glucose, UA: 150 mg/dL — AB
HGB URINE DIPSTICK: NEGATIVE
Ketones, ur: NEGATIVE mg/dL
LEUKOCYTES UA: NEGATIVE
NITRITE: NEGATIVE
Protein, ur: NEGATIVE mg/dL
SPECIFIC GRAVITY, URINE: 1.005 (ref 1.005–1.030)
pH: 5 (ref 5.0–8.0)

## 2018-06-02 LAB — CBC
HCT: 43.1 % (ref 36.0–46.0)
HEMOGLOBIN: 13.5 g/dL (ref 12.0–15.0)
MCH: 21.8 pg — AB (ref 26.0–34.0)
MCHC: 31.3 g/dL (ref 30.0–36.0)
MCV: 69.7 fL — ABNORMAL LOW (ref 78.0–100.0)
Platelets: 403 10*3/uL — ABNORMAL HIGH (ref 150–400)
RBC: 6.18 MIL/uL — AB (ref 3.87–5.11)
RDW: 15.9 % — ABNORMAL HIGH (ref 11.5–15.5)
WBC: 8.9 10*3/uL (ref 4.0–10.5)

## 2018-06-02 LAB — TYPE AND SCREEN
ABO/RH(D): O POS
ANTIBODY SCREEN: NEGATIVE

## 2018-06-02 LAB — LIPASE, BLOOD: Lipase: 35 U/L (ref 11–51)

## 2018-06-02 LAB — I-STAT TROPONIN, ED: TROPONIN I, POC: 0 ng/mL (ref 0.00–0.08)

## 2018-06-02 LAB — ABO/RH: ABO/RH(D): O POS

## 2018-06-02 LAB — CBG MONITORING, ED: GLUCOSE-CAPILLARY: 82 mg/dL (ref 70–99)

## 2018-06-02 MED ORDER — ONDANSETRON HCL 4 MG PO TABS: 4.0000 mg | ORAL_TABLET | Freq: Four times a day (QID) | ORAL | Status: DC | PRN

## 2018-06-02 MED ORDER — MORPHINE SULFATE (PF) 4 MG/ML IV SOLN
2.0000 mg | Freq: Once | INTRAVENOUS | Status: AC
  Administered 2018-06-02: 2 mg via INTRAVENOUS
  Filled 2018-06-02: qty 1

## 2018-06-02 MED ORDER — ACETAMINOPHEN 325 MG PO TABS: 650.0000 mg | ORAL_TABLET | Freq: Four times a day (QID) | ORAL | Status: DC | PRN

## 2018-06-02 MED ORDER — MORPHINE SULFATE (PF) 4 MG/ML IV SOLN: 3.0000 mg | INTRAVENOUS | Status: DC | PRN

## 2018-06-02 MED ORDER — SODIUM CHLORIDE 0.9% FLUSH
3.0000 mL | Freq: Two times a day (BID) | INTRAVENOUS | Status: DC
  Administered 2018-06-03: 3 mL via INTRAVENOUS

## 2018-06-02 MED ORDER — ONDANSETRON HCL 4 MG/2ML IJ SOLN: 4.0000 mg | Freq: Four times a day (QID) | INTRAMUSCULAR | Status: DC | PRN

## 2018-06-02 MED ORDER — INSULIN ASPART 100 UNIT/ML ~~LOC~~ SOLN: 0.0000 [IU] | SUBCUTANEOUS | Status: DC

## 2018-06-02 MED ORDER — SODIUM CHLORIDE 0.9 % IV BOLUS
500.0000 mL | Freq: Once | INTRAVENOUS | Status: AC
  Administered 2018-06-02: 500 mL via INTRAVENOUS

## 2018-06-02 MED ORDER — POTASSIUM CHLORIDE IN NACL 20-0.9 MEQ/L-% IV SOLN
INTRAVENOUS | Status: AC
  Administered 2018-06-03: via INTRAVENOUS
  Filled 2018-06-02 (×2): qty 1000

## 2018-06-02 MED ORDER — POTASSIUM CHLORIDE CRYS ER 20 MEQ PO TBCR
20.0000 meq | EXTENDED_RELEASE_TABLET | Freq: Once | ORAL | Status: AC
  Administered 2018-06-02: 20 meq via ORAL
  Filled 2018-06-02: qty 1

## 2018-06-02 MED ORDER — ADULT MULTIVITAMIN W/MINERALS CH
1.0000 | ORAL_TABLET | Freq: Every day | ORAL | Status: DC
  Administered 2018-06-03: 1 via ORAL
  Filled 2018-06-02: qty 1

## 2018-06-02 MED ORDER — CYCLOBENZAPRINE HCL 10 MG PO TABS: 5.0000 mg | ORAL_TABLET | Freq: Three times a day (TID) | ORAL | Status: DC | PRN

## 2018-06-02 MED ORDER — IOHEXOL 300 MG/ML  SOLN
100.0000 mL | Freq: Once | INTRAMUSCULAR | Status: AC | PRN
  Administered 2018-06-02: 100 mL via INTRAVENOUS

## 2018-06-02 MED ORDER — HYDROCODONE-ACETAMINOPHEN 5-325 MG PO TABS: 1.0000 | ORAL_TABLET | ORAL | Status: DC | PRN

## 2018-06-02 MED ORDER — ACETAMINOPHEN 650 MG RE SUPP: 650.0000 mg | Freq: Four times a day (QID) | RECTAL | Status: DC | PRN

## 2018-06-02 MED ORDER — CARVEDILOL 3.125 MG PO TABS
3.1250 mg | ORAL_TABLET | Freq: Two times a day (BID) | ORAL | Status: DC
  Administered 2018-06-03: 3.125 mg via ORAL
  Filled 2018-06-02: qty 1

## 2018-06-02 NOTE — Telephone Encounter (Signed)
Call placed to the pt, no answer.  I noticed in Epic she is in the ED will wait for further communication from the pt

## 2018-06-02 NOTE — ED Notes (Signed)
ED Provider at bedside. 

## 2018-06-02 NOTE — ED Notes (Signed)
Patient transported to CT 

## 2018-06-02 NOTE — Progress Notes (Signed)
  Pt admitted to the unit. Pt is stable, alert and oriented per baseline. Oriented to room, staff, and call bell. Educated to call for any assistance. Bed in lowest position, call bell within reach- will continue to monitor. 

## 2018-06-02 NOTE — ED Provider Notes (Signed)
[facility] EMERGENCY DEPARTMENT Provider Note   CSN: [id number] Arrival date & time: 06/02/18  1400     History   Chief Complaint Chief Complaint  Patient presents with  . Rectal Bleeding    HPI Carol Mueller is a 57 y.o. female.  HPI Patient states that she developed fever to 101 and lower abdominal pain last week.  This gradually improved.  Had one episode of gross rectal bleeding on Saturday and then 2 again today.  States she has had some loose stools since which have not been bloody.  She continues to have lower abdominal pain.  Complains of generalized weakness.  Was seen at urgent care last week and diagnosed with UTI.  She is been taking doxycycline.  States she did not take it today. Past Medical History:  Diagnosis Date  . Anemia   . 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 Diabetes mellitus without complication (HCC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  . Diverticulitis   . Diverticulosis   . GI bleed   . Hyperlipidemia   . Hypertension   . Polycystic ovary disease     Patient Active Problem List   Diagnosis Date Noted  . Hypokalemia 06/02/2018  . Back pain with left-sided radiculopathy 09/29/2017  . Diarrhea 12/16/2016  . Lower GI bleed 11/30/2016  . History of GI diverticular bleed   . Diverticulosis of colon with hemorrhage 06/05/2016  . Ischemic colitis ([place]) 08/07/2015  . Left sided abdominal pain   . Tachycardia 07/15/2015  . Polycystic disease, ovaries 09/12/2012  . Class 3 obesity due to excess calories with body mass index (BMI) of 40.0 to 44.9 in adult 09/12/2012  . Essential hypertension 09/12/2012  . Type II diabetes mellitus, uncontrolled ([place]) 09/12/2012  . Hyperlipidemia with target LDL less than 100 09/12/2012  . 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 Allergic rhinitis 04/06/2012    Past Surgical History:  Procedure Laterality Date  . ADRENALECTOMY  1996  . CHOLECYSTECTOMY    .  ESOPHAGOGASTRODUODENOSCOPY (EGD) WITH PROPOFOL N/A 07/16/2015   Procedure: ESOPHAGOGASTRODUODENOSCOPY (EGD) WITH PROPOFOL;  Surgeon: [name], MD;  Location: [facility] ENDOSCOPY;  Service: Endoscopy;  Laterality: N/A;     OB History   None      Home Medications    Prior to Admission medications   Medication Sig Start Date End Date Taking? Authorizing Provider  Canagliflozin (INVOKANA) 100 MG TABS Take 100 mg by mouth daily.     [provider]  carvedilol (COREG) 3.125 MG tablet Take 3.125 mg by mouth 2 (two) times daily with a meal.    [provider]  cyclobenzaprine (FLEXERIL) 10 MG tablet Take 0.5-1 tablets (5-10 mg total) by mouth 3 (three) times daily as needed for muscle spasms. Do not mix with narcotics. May cause drowsiness. Take in the daytime. 08/23/17   Tereasa Coop, PA-C  dicyclomine (BENTYL) 20 MG tablet TAKE 1 TABLET BY MOUTH TWICE A DAY 06/02/17   [name], MD  doxycycline (VIBRAMYCIN) 100 MG capsule Take 100 mg by mouth 2 (two) times daily. For 10 days 05/28/18 06/07/18  [provider]  ferrous sulfate 325 (65 FE) MG tablet Take 1 tablet (325 mg total) by mouth daily with breakfast. 12/02/16   Murlean Iba, MD  HYDROcodone-acetaminophen (NORCO) 5-325 MG tablet Take 1 tablet by mouth every 6 (six) hours as needed for severe pain (May cause constipation). For severe pain only.  Do not mix with alcohol, benzodiazepines, muscle relaxer. No refills without office visit. Take in the night time. 08/23/17   Tereasa Coop, PA-C  insulin glargine (LANTUS) 100 UNIT/ML injection Inject 45 Units into the skin at bedtime.    [provider]  losartan-hydrochlorothiazide (HYZAAR) 100-25 MG per tablet TAKE 1 TABLET BY MOUTH DAILY. NEEDS OFFICE VISIT 03/20/13   Shawnee Knapp, MD  Lutein-Zeaxanthin 20-1 MG CAPS Take 1 capsule by mouth daily.    [provider]  metFORMIN (GLUCOPHAGE) 1000 MG tablet Take 1 tablet (1,000 mg total) by mouth  2 (two) times daily with a meal. PATIENT NEEDS OFFICE VISIT FOR ADDITIONAL REFILLS 09/07/13   Theda Sers, PA-C  Multiple Vitamins-Minerals (MULTIVITAMIN WITH MINERALS) tablet Take 1 tablet by mouth daily.    [provider]  Omega 3 1000 MG CAPS Take 1,000 mg by mouth daily.    [provider]  Specialty Vitamins Products (ADVANCED COLLAGEN PO) Take 1 scoop by mouth daily.    [provider]  traMADol (ULTRAM) 50 MG tablet Take 1-2 tablets (50-100 mg total) by mouth every 8 (eight) hours as needed. Take in the daytime. 08/23/17   Tereasa Coop, PA-C  Turmeric 500 MG TABS Take 1 tablet by mouth at bedtime.    [provider]    Family History Family History  Problem Relation Age of Onset  . Hypertension Mother   . Cancer Mother        ?uterine or ovarian cancer  . Hypertension Father   . Diabetes Father   . Hypertension Sister   . Diabetes Sister   . Hypertension Brother   . Diabetes Brother   . Hypertension Maternal Grandfather   . Diabetes Paternal Grandmother   . Breast cancer Maternal Grandmother   . Colon cancer Neg Hx     Social History Social History   Tobacco Use  . Smoking status: Never Smoker  . Smokeless tobacco: Never Used  Substance Use Topics  . Alcohol use: No  . Drug use: No     Allergies   Norvasc [amlodipine besylate] and Nsaids   Review of Systems Review of Systems  Constitutional: Positive for chills, fatigue and fever.  HENT: Negative for sore throat and trouble swallowing.   Respiratory: Negative for chest tightness and shortness of breath.   Cardiovascular: Negative for chest pain, palpitations and leg swelling.  Gastrointestinal: Positive for abdominal pain, blood in stool and diarrhea. Negative for nausea and vomiting.  Genitourinary: Negative for dysuria, flank pain, frequency and hematuria.  Musculoskeletal: Negative for back pain, neck pain and neck stiffness.  Skin: Negative for rash and wound.    Neurological: Negative for dizziness, weakness, light-headedness, numbness and headaches.  All other systems reviewed and are negative.    Physical Exam Updated Vital Signs BP 112/67 (BP Location: Left Arm)   Pulse 81   Temp 98.9 F (37.2 C) (Oral)   Resp 16   Ht 5\' 4"  (1.626 m)   Wt 108.9 kg (240 lb)   SpO2 96%   BMI 41.20 kg/m   Physical Exam  Constitutional: She is oriented to person, place, and time. She appears well-developed and well-nourished.  HENT:  Head: Normocephalic and atraumatic.  Mouth/Throat: Oropharynx is clear and moist. No oropharyngeal exudate.  Eyes: Pupils are equal, round, and reactive to light. EOM are normal.  Neck: Normal range of motion. Neck supple.  Cardiovascular: Normal rate and regular rhythm.  Pulmonary/Chest: Effort normal and breath sounds  normal.  Abdominal: Soft. Bowel sounds are normal. There is tenderness. There is no rebound and no guarding.  Patient with left lower quadrant and right lower quadrant tenderness to palpation.  Guarding present.  No rebound tenderness.  Musculoskeletal: Normal range of motion. She exhibits no edema or tenderness.  No CVA tenderness.  No midline thoracic or lumbar tenderness.  No lower extremity swelling, asymmetry or tenderness.  Neurological: She is alert and oriented to person, place, and time.  Moves all extremities without deficit.  Sensation intact.  Skin: Skin is warm and dry. No rash noted. No erythema.  Psychiatric: She has a normal mood and affect. Her behavior is normal.  Nursing note and vitals reviewed.    ED Treatments / Results  Labs (all labs ordered are listed, but only abnormal results are displayed) Labs Reviewed  COMPREHENSIVE METABOLIC PANEL - Abnormal; Notable for the following components:      Result Value   Potassium 3.1 (*)    Glucose, Bld 130 (*)    AST 43 (*)    ALT 61 (*)    All other components within normal limits  CBC - Abnormal; Notable for the following  components:   RBC 6.18 (*)    MCV 69.7 (*)    MCH 21.8 (*)    RDW 15.9 (*)    Platelets 403 (*)    All other components within normal limits  LIPASE, BLOOD  URINALYSIS, ROUTINE W REFLEX MICROSCOPIC  I-STAT TROPONIN, ED  TYPE AND SCREEN  ABO/RH    EKG None  Radiology Ct Abdomen Pelvis W Contrast  Result Date: 06/02/2018 CLINICAL DATA:  Lower gastrointestinal bleeding. Polycystic ovarian disease. EXAM: CT ABDOMEN AND PELVIS WITH CONTRAST TECHNIQUE: Multidetector CT imaging of the abdomen and pelvis was performed using the standard protocol following bolus administration of intravenous contrast. CONTRAST:  197mL OMNIPAQUE IOHEXOL 300 MG/ML  SOLN COMPARISON:  11/30/2016 FINDINGS: Lower chest: Stable mild cardiomegaly with bibasilar dependent atelectasis. No pericardial effusion. Hepatobiliary: Hepatic steatosis. Cholecystectomy. No mass or biliary dilatation. Pancreas: Normal Spleen: Normal Adrenals/Urinary Tract: Bilateral simple appearing renal cysts, the largest measuring 2.5 x 2.3 x 2.1 cm in the interpolar right kidney and 1.9 x 2.1 x 2.3 cm in the lower pole left kidney. Smaller too small to characterize hypodensities are noted within the left kidney. No nephrolithiasis nor solid enhancing mass lesions. Right adrenalectomy with surgical clips in the adrenal bed. Left adrenal gland is normal. Stomach/Bowel: The stomach is decompressed. The duodenal sweep and ligament of Treitz position are normal. Nonobstructed, nondistended small bowel. Normal appendix, distal and terminal ileum. There is extensive colonic diverticulosis without acute diverticulitis. Vascular/Lymphatic: No abdominal aortic aneurysm. No lymphadenopathy. Reproductive: Redemonstration of fibroid uterus with several coarsely calcified intramural and subserosal uterine leiomyomata again demonstrated without significant interval change. No adnexal mass. Other: No free air nor free fluid. Musculoskeletal: No acute or significant  osseous findings. IMPRESSION: 1. Extensive colonic diverticulosis without acute diverticulitis. Suspect diverticular disease as a leading etiology for the patient's lower gastrointestinal bleeding. 2. Fibroid uterus.  No adnexal mass. 3. Status post right adrenalectomy. 4. Simple appearing bilateral renal cysts. Some are too small to further characterize however within the left kidney. 5. Hepatic steatosis. 6. Mild cardiomegaly. Electronically Signed   By: Ashley Royalty M.D.   On: 06/02/2018 20:47    Procedures Procedures (including critical care time)  Medications Ordered in ED Medications  morphine 4 MG/ML injection 2 mg (2 mg Intravenous Given 06/02/18 1936)  sodium chloride 0.9 %  bolus 500 mL (0 mLs Intravenous Stopped 06/02/18 2037)  iohexol (OMNIPAQUE) 300 MG/ML solution 100 mL (100 mLs Intravenous Contrast Given 06/02/18 2024)     Initial Impression / Assessment and Plan / ED Course  I have reviewed the triage vital signs and the nursing notes.  Pertinent labs & imaging results that were available during my care of the patient were reviewed by me and considered in my medical decision making (see chart for details).    CT with evidence of diverticulosis but no evidence of diverticulitis.  Hemoglobin is stable as well as vital signs.  Discussed with hospitalist who will see patient in the emergency department and admit.   Final Clinical Impressions(s) / ED Diagnoses   Final diagnoses:  Acute GI bleeding    ED Discharge Orders    None       Julianne Rice, MD 06/02/18 2121

## 2018-06-02 NOTE — ED Triage Notes (Signed)
Pt states that she thinks she is having a GI bleed, she reports that she had one episode of liquid blood on Sunday and today she had 2 episodes of bleeding. Denies any NV. Pt states that she has diverticulosis and has had multiple admissions for GI bleeds.

## 2018-06-02 NOTE — H&P (Signed)
History and Physical    Carol Mueller ZOX:[id number] DOB: Jan 05, 1961 DOA: 06/02/2018  PCP: Shawnee Knapp, MD   Patient coming from: Home  Chief Complaint: rectal bleeding, abdominal discomfort   HPI: Carol Mueller is a 57 y.o. female with medical history significant for insulin-dependent diabetes mellitus, hypertension, and diverticulosis with history of bleeding, now presenting to the emergency department with lower abdominal discomfort and bright red blood per rectum.  Patient reports that she had been in her usual state of health until developing mild cramping and discomfort in her left lower quadrant several days ago.  She had one episode of bright red blood with some liquid stool on 05/29/2018.  There was no further bleeding until today when she had 2 similar episodes, prompting her presentation to the ED.  She denies any upper abdominal pain, indigestion, or vomiting.  She had a normal EGD in 2016 and diverticulosis on colonoscopy in 2014.  She denies chest pain, lightheadedness, or palpitations.  ED Course: Upon arrival to the ED, patient is found to be afebrile, saturating well on room air, and with vitals otherwise normal.  Chemistry panel is notable for potassium of 3.1 and slight elevation in transaminases.  CBC features a microcytosis without anemia and a chronic mild thrombocytosis.  Troponin is undetectable.  Type and screen was performed and the patient was treated with 500 cc of normal saline and morphine in the ED.  She remains hemodynamically stable, has not bled in the ED, and will be admitted for ongoing evaluation and management of suspected recurrent diverticular bleed.  Review of Systems:  All other systems reviewed and apart from HPI, are negative.  Past Medical History:  Diagnosis Date  . Anemia   . 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 Diabetes mellitus without complication (HCC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  . Diverticulitis   . Diverticulosis   . GI bleed    . Hyperlipidemia   . Hypertension   . Polycystic ovary disease     Past Surgical History:  Procedure Laterality Date  . ADRENALECTOMY  1996  . CHOLECYSTECTOMY    . ESOPHAGOGASTRODUODENOSCOPY (EGD) WITH PROPOFOL N/A 07/16/2015   Procedure: ESOPHAGOGASTRODUODENOSCOPY (EGD) WITH PROPOFOL;  Surgeon: [name], MD;  Location: [facility];  Service: Endoscopy;  Laterality: N/A;     reports that she has never smoked. She has never used smokeless tobacco. She reports that she does not drink alcohol or use drugs.  Allergies  Allergen Reactions  . Norvasc [Amlodipine Besylate] Swelling    ANGIOEDEMA  . Nsaids Other (See Comments)    GI upset/ GI bleed    Family History  Problem Relation Age of Onset  . Hypertension Mother   . Cancer Mother        ?uterine or ovarian cancer  . Hypertension Father   . Diabetes Father   . Hypertension Sister   . Diabetes Sister   . Hypertension Brother   . Diabetes Brother   . Hypertension Maternal Grandfather   . Diabetes Paternal Grandmother   . Breast cancer Maternal Grandmother   . Colon cancer Neg Hx      Prior to Admission medications   Medication Sig Start Date End Date Taking? Authorizing Provider  Canagliflozin (INVOKANA) 100 MG TABS Take 100 mg by mouth daily.     [provider]  carvedilol (COREG) 3.125 MG tablet Take 3.125 mg by mouth 2 (two) times daily with a meal.    [provider]  cyclobenzaprine (FLEXERIL) 10 MG tablet Take 0.5-1 tablets (5-10 mg total) by mouth 3 (three) times daily as needed for muscle spasms. Do not mix with narcotics. May cause drowsiness. Take in the daytime. 08/23/17   Tereasa Coop, PA-C  dicyclomine (BENTYL) 20 MG tablet TAKE 1 TABLET BY MOUTH TWICE A DAY 06/02/17   [name], MD  doxycycline (VIBRAMYCIN) 100 MG capsule Take 100 mg by mouth 2 (two) times daily. For 10 days 05/28/18 06/07/18  [provider]  ferrous sulfate 325 (65 FE) MG tablet Take 1 tablet (325  mg total) by mouth daily with breakfast. 12/02/16   Murlean Iba, MD  HYDROcodone-acetaminophen (NORCO) 5-325 MG tablet Take 1 tablet by mouth every 6 (six) hours as needed for severe pain (May cause constipation). For severe pain only. Do not mix with alcohol, benzodiazepines, muscle relaxer. No refills without office visit. Take in the night time. 08/23/17   Tereasa Coop, PA-C  insulin glargine (LANTUS) 100 UNIT/ML injection Inject 45 Units into the skin at bedtime.    [provider]  losartan-hydrochlorothiazide (HYZAAR) 100-25 MG per tablet TAKE 1 TABLET BY MOUTH DAILY. NEEDS OFFICE VISIT 03/20/13   Shawnee Knapp, MD  Lutein-Zeaxanthin 20-1 MG CAPS Take 1 capsule by mouth daily.    [provider]  metFORMIN (GLUCOPHAGE) 1000 MG tablet Take 1 tablet (1,000 mg total) by mouth 2 (two) times daily with a meal. PATIENT NEEDS OFFICE VISIT FOR ADDITIONAL REFILLS 09/07/13   Theda Sers, PA-C  Multiple Vitamins-Minerals (MULTIVITAMIN WITH MINERALS) tablet Take 1 tablet by mouth daily.    [provider]  Omega 3 1000 MG CAPS Take 1,000 mg by mouth daily.    [provider]  Specialty Vitamins Products (ADVANCED COLLAGEN PO) Take 1 scoop by mouth daily.    [provider]  traMADol (ULTRAM) 50 MG tablet Take 1-2 tablets (50-100 mg total) by mouth every 8 (eight) hours as needed. Take in the daytime. 08/23/17   Tereasa Coop, PA-C  Turmeric 500 MG TABS Take 1 tablet by mouth at bedtime.    [provider]    Physical Exam: Vitals:   06/02/18 1704 06/02/18 1900 06/02/18 1915 06/02/18 2111  BP: 121/77 135/84 130/83 112/67  Pulse: 83 86 79 81  Resp: 16   16  Temp: 98.9 F (37.2 C)     TempSrc: Oral     SpO2: 99% 99% 96% 96%  Weight:      Height:          Constitutional: NAD, calm, obese Eyes: PERTLA, lids and conjunctivae normal ENMT: Mucous membranes are moist. Posterior pharynx clear of any exudate or lesions.   Neck: normal,  supple, no masses, no thyromegaly Respiratory: clear to auscultation bilaterally, no wheezing, no crackles. Normal respiratory effort.   Cardiovascular: S1 & S2 heard, regular rate and rhythm. No extremity edema.  Abdomen: No distension, soft, mild LLQ tenderness without rebound pain or guarding. Bowel sounds normal.  Musculoskeletal: no clubbing / cyanosis. No joint deformity upper and lower extremities.   Skin: no significant rashes, lesions, ulcers. Warm, dry, well-perfused. Neurologic: CN 2-12 grossly intact. Sensation intact. Strength 5/5 in all 4 limbs.  Psychiatric: Alert and oriented x 3. Calm and cooperative.     Labs on Admission: I have personally reviewed following labs and imaging studies  CBC: Recent Labs  Lab 06/02/18 1435  WBC 8.9  HGB 13.5  HCT 43.1  MCV 69.7*  PLT 161*   Basic Metabolic Panel: Recent Labs  Lab 06/02/18 1435  NA 135  K 3.1*  CL 99  CO2 26  GLUCOSE 130*  BUN 10  CREATININE 0.75  CALCIUM 8.9   GFR: Estimated Creatinine Clearance: 93.6 mL/min (by C-G formula based on SCr of 0.75 mg/dL). Liver Function Tests: Recent Labs  Lab 06/02/18 1435  AST 43*  ALT 61*  ALKPHOS 59  BILITOT 0.5  PROT 7.6  ALBUMIN 3.7   Recent Labs  Lab 06/02/18 1435  LIPASE 35   No results for input(s): AMMONIA in the last 168 hours. Coagulation Profile: No results for input(s): INR, PROTIME in the last 168 hours. Cardiac Enzymes: No results for input(s): CKTOTAL, CKMB, CKMBINDEX, TROPONINI in the last 168 hours. BNP (last 3 results) No results for input(s): PROBNP in the last 8760 hours. HbA1C: No results for input(s): HGBA1C in the last 72 hours. CBG: No results for input(s): GLUCAP in the last 168 hours. Lipid Profile: No results for input(s): CHOL, HDL, LDLCALC, TRIG, CHOLHDL, LDLDIRECT in the last 72 hours. Thyroid Function Tests: No results for input(s): TSH, T4TOTAL, FREET4, T3FREE, THYROIDAB in the last 72 hours. Anemia Panel: No results  for input(s): VITAMINB12, FOLATE, FERRITIN, TIBC, IRON, RETICCTPCT in the last 72 hours. Urine analysis:    Component Value Date/Time   COLORURINE YELLOW 11/30/2016 [place] 11/30/2016 1204   LABSPEC 1.025 11/30/2016 1204   PHURINE 5.0 11/30/2016 1204   GLUCOSEU >=500 (A) 11/30/2016 1204   HGBUR NEGATIVE 11/30/2016 [place] 11/30/2016 1204   BILIRUBINUR neg 08/25/2014 1157   KETONESUR NEGATIVE 11/30/2016 1204   PROTEINUR NEGATIVE 11/30/2016 1204   UROBILINOGEN 0.2 08/25/2014 1157   NITRITE NEGATIVE 11/30/2016 1204   LEUKOCYTESUR NEGATIVE 11/30/2016 1204   Sepsis Labs: @LABRCNTIP (procalcitonin:4,lacticidven:4) )No results found for this or any previous visit (from the past 240 hour(s)).   Radiological Exams on Admission: Ct Abdomen Pelvis W Contrast  Result Date: 06/02/2018 CLINICAL DATA:  Lower gastrointestinal bleeding. Polycystic ovarian disease. EXAM: CT ABDOMEN AND PELVIS WITH CONTRAST TECHNIQUE: Multidetector CT imaging of the abdomen and pelvis was performed using the standard protocol following bolus administration of intravenous contrast. CONTRAST:  130mL OMNIPAQUE IOHEXOL 300 MG/ML  SOLN COMPARISON:  11/30/2016 FINDINGS: Lower chest: Stable mild cardiomegaly with bibasilar dependent atelectasis. No pericardial effusion. Hepatobiliary: Hepatic steatosis. Cholecystectomy. No mass or biliary dilatation. Pancreas: Normal Spleen: Normal Adrenals/Urinary Tract: Bilateral simple appearing renal cysts, the largest measuring 2.5 x 2.3 x 2.1 cm in the interpolar right kidney and 1.9 x 2.1 x 2.3 cm in the lower pole left kidney. Smaller too small to characterize hypodensities are noted within the left kidney. No nephrolithiasis nor solid enhancing mass lesions. Right adrenalectomy with surgical clips in the adrenal bed. Left adrenal gland is normal. Stomach/Bowel: The stomach is decompressed. The duodenal sweep and ligament of Treitz position are normal.  Nonobstructed, nondistended small bowel. Normal appendix, distal and terminal ileum. There is extensive colonic diverticulosis without acute diverticulitis. Vascular/Lymphatic: No abdominal aortic aneurysm. No lymphadenopathy. Reproductive: Redemonstration of fibroid uterus with several coarsely calcified intramural and subserosal uterine leiomyomata again demonstrated without significant interval change. No adnexal mass. Other: No free air nor free fluid. Musculoskeletal: No acute or significant osseous findings. IMPRESSION: 1. Extensive colonic diverticulosis without acute diverticulitis. Suspect diverticular disease as a leading etiology for the patient's lower gastrointestinal bleeding. 2. Fibroid uterus.  No adnexal mass. 3. Status post right adrenalectomy. 4. Simple appearing bilateral renal cysts.  Some are too small to further characterize however within the left kidney. 5. Hepatic steatosis. 6. Mild cardiomegaly. Electronically Signed   By: Ashley Royalty M.D.   On: 06/02/2018 20:47    EKG: Not performed.   Assessment/Plan   1. Rectal bleeding  - Presents with lower abdominal discomfort, one episode of rectal bleeding on 7/14, and 2 today  - She is hemodynamically stable with normal H&H in ED  - CT abd/pelvis with extensive diverticulosis, no acute diverticulitis or other acute process  - She had normal EGD in 2016 and colonoscopy in 2014 with diverticulosis only  - Based on hx, imaging, low BUN, most likely diverticular - Type and screen was performed in ED  - Keep NPO initially, follow H&H, gentle IVF hydration, supportive care    2. Insulin-dependent DM  - A1c was 7.5% in January 2018  - Managed at home with Lantus 45 units qHS and Invokana  - She will NPO initially  - Follow CBG's and use SSI only to start    3. Hypokalemia  - Serum potassium is 3.1 on admission, possibly from loose stools  - Treated with 20 mEq oral potassium and KCl added to IVF  - Repeat chem panel in am    4.  Hypertension  - BP at goal  - Continue Coreg as tolerated  - Losartan-HCTZ held initially while hydrating, resume if she remains stable     DVT prophylaxis: SCD's  Code Status: Full  Family Communication: Discussed with patient Consults called: None Admission status: Inpatient     Vianne Bulls, MD [facility] Pager [phone]  If 7PM-7AM, please contact night-coverage [email or web address] Password [facility]  06/02/2018, 9:24 PM

## 2018-06-02 NOTE — Telephone Encounter (Signed)
Patient states she has had a gi bleed this morning and the other day and wants advice.

## 2018-06-02 NOTE — ED Notes (Signed)
CBG 82 

## 2018-06-03 DIAGNOSIS — I10 Essential (primary) hypertension: Secondary | ICD-10-CM | POA: Diagnosis not present

## 2018-06-03 DIAGNOSIS — E1165 Type 2 diabetes mellitus with hyperglycemia: Secondary | ICD-10-CM | POA: Diagnosis not present

## 2018-06-03 DIAGNOSIS — K922 Gastrointestinal hemorrhage, unspecified: Secondary | ICD-10-CM

## 2018-06-03 LAB — COMPREHENSIVE METABOLIC PANEL
ALK PHOS: 52 U/L (ref 38–126)
ALT: 53 U/L — AB (ref 0–44)
AST: 42 U/L — AB (ref 15–41)
Albumin: 3.3 g/dL — ABNORMAL LOW (ref 3.5–5.0)
Anion gap: 10 (ref 5–15)
BUN: 9 mg/dL (ref 6–20)
CALCIUM: 8.7 mg/dL — AB (ref 8.9–10.3)
CO2: 27 mmol/L (ref 22–32)
CREATININE: 0.73 mg/dL (ref 0.44–1.00)
Chloride: 102 mmol/L (ref 98–111)
GFR calc non Af Amer: >60 mL/min (ref >60)
GLUCOSE: 99 mg/dL (ref 70–99)
Potassium: 3.2 mmol/L — ABNORMAL LOW (ref 3.5–5.1)
Sodium: 139 mmol/L (ref 135–145)
Total Bilirubin: 0.6 mg/dL (ref 0.3–1.2)
Total Protein: 6.8 g/dL (ref 6.5–8.1)

## 2018-06-03 LAB — HIV ANTIBODY (ROUTINE TESTING W REFLEX): HIV SCREEN 4TH GENERATION: NONREACTIVE

## 2018-06-03 LAB — GLUCOSE, CAPILLARY
GLUCOSE-CAPILLARY: 96 mg/dL (ref 70–99)
GLUCOSE-CAPILLARY: 85 mg/dL (ref 70–99)
Glucose-Capillary: 89 mg/dL (ref 70–99)

## 2018-06-03 LAB — HEMOGLOBIN
Hemoglobin: 12.3 g/dL (ref 12.0–15.0)
Hemoglobin: 12.4 g/dL (ref 12.0–15.0)

## 2018-06-03 LAB — HEMATOCRIT
HCT: 39.8 % (ref 36.0–46.0)
HCT: 40.4 % (ref 36.0–46.0)

## 2018-06-03 MED ORDER — POTASSIUM CHLORIDE CRYS ER 20 MEQ PO TBCR
40.0000 meq | EXTENDED_RELEASE_TABLET | Freq: Once | ORAL | Status: AC
  Administered 2018-06-03: 40 meq via ORAL
  Filled 2018-06-03: qty 2

## 2018-06-03 NOTE — Progress Notes (Signed)
Carol Mueller discharged Home per MD order.  Discharge instructions reviewed and discussed with the patient, all questions and concerns answered. Copy of instructions and scripts given to patient.  Allergies as of 06/03/2018      Reactions   Norvasc [amlodipine Besylate] Swelling   ANGIOEDEMA   Nsaids Other (See Comments)   GI upset/ GI bleed      Medication List    STOP taking these medications   doxycycline 100 MG capsule Commonly known as:  VIBRAMYCIN     TAKE these medications   ADVANCED COLLAGEN PO Take 1 scoop by mouth daily.   carvedilol 3.125 MG tablet Commonly known as:  COREG Take 3.125 mg by mouth 2 (two) times daily with a meal.   cyclobenzaprine 10 MG tablet Commonly known as:  FLEXERIL Take 0.5-1 tablets (5-10 mg total) by mouth 3 (three) times daily as needed for muscle spasms. Do not mix with narcotics. May cause drowsiness. Take in the daytime.   dicyclomine 20 MG tablet Commonly known as:  BENTYL TAKE 1 TABLET BY MOUTH TWICE A DAY What changed:    how much to take  how to take this  when to take this   ferrous sulfate 325 (65 FE) MG tablet Take 1 tablet (325 mg total) by mouth daily with breakfast.   HYDROcodone-acetaminophen 5-325 MG tablet Commonly known as:  NORCO Take 1 tablet by mouth every 6 (six) hours as needed for severe pain (May cause constipation). For severe pain only. Do not mix with alcohol, benzodiazepines, muscle relaxer. No refills without office visit. Take in the night time.   insulin glargine 100 UNIT/ML injection Commonly known as:  LANTUS Inject 45 Units into the skin at bedtime.   TOUJEO SOLOSTAR [place] Inject 35 Units into the skin at bedtime.   INVOKANA 100 MG Tabs tablet Generic drug:  canagliflozin Take 100 mg by mouth daily.   losartan-hydrochlorothiazide 100-25 MG tablet Commonly known as:  HYZAAR TAKE 1 TABLET BY MOUTH DAILY. NEEDS OFFICE VISIT   Lutein-Zeaxanthin 20-1 MG Caps Take 1 capsule by mouth daily.   metFORMIN 1000 MG tablet Commonly known as:  GLUCOPHAGE Take 1 tablet (1,000 mg total) by mouth 2 (two) times daily with a meal. PATIENT NEEDS OFFICE VISIT FOR ADDITIONAL REFILLS What changed:    how much to take  when to take this  additional instructions   multivitamin with minerals tablet Take 1 tablet by mouth daily.   Omega 3 1000 MG Caps Take 1,000 mg by mouth daily.   traMADol 50 MG tablet Commonly known as:  ULTRAM Take 1-2 tablets (50-100 mg total) by mouth every 8 (eight) hours as needed. Take in the daytime.   Turmeric 500 MG Tabs Take 1 tablet by mouth at bedtime.       IV site discontinued and catheter remains intact. Site without signs and symptoms of complications. Dressing and pressure applied.  Patient escorted to car by volunteer services in a wheelchair,  no distress noted upon discharge.  Carol Mueller, Lisvet Rasheed C 06/03/2018 12:38 PM

## 2018-06-03 NOTE — Discharge Summary (Signed)
Physician Discharge Summary  CARILYN WOOLSTON IZT:[id number] DOB: May 17, 1961 DOA: 06/02/2018  PCP: Shawnee Knapp, MD  Admit date: 06/02/2018 Discharge date: 06/03/2018  Time spent: 35 minutes   Discharge Diagnoses:  Principal Problem:   Lower GI bleed Active Problems:   Essential hypertension   Type II diabetes mellitus, uncontrolled (HCC)   Hypokalemia   Lower GI bleeding   Discharge Condition: Stable and improved  Diet recommendation: Cardiac healthy heart  Filed Weights   06/02/18 1428 06/02/18 2220 06/03/18 0420  Weight: 108.9 kg (240 lb) 107.9 kg (237 lb 14 oz) 107.9 kg (237 lb 14 oz)    Hospital Course:  57 year old female with a history of diverticulosis and previous diverticular bleed presented to the emergency department with lower GI bleed with bright red blood per rectum.  Patient was observed overnight had serial hemoglobins which have all been Over 12.  Her bleeding has stopped since yesterday.  She denies any abdominal pain.  She denies any fevers and has had none since arrival.  She has had no nausea or vomiting.  Patient instructed to follow-up with primary care physician approximately 1 week.  This was likely recurrent diverticular bleed.  Patient instructed to return to the emergency department if she has any recurrence of significant bleeding or any symptoms suggestive of worsening anemia.  Patient is a Equities trader and fully understands instructions.   Discharge Exam: Vitals:   06/02/18 2221 06/03/18 0425  BP: 103/70 102/69  Pulse: 83 84  Resp: 17   Temp: 98.6 F (37 C) 98.8 F (37.1 C)  SpO2: 95% 93%    General: Alert and oriented x4 no apparent distress Cardiovascular: Regular rate and rhythm without murmurs rubs or gallops Respiratory: Clear to auscultation bilaterally no wheezes rhonchi rales  Discharge Instructions   Discharge Instructions    Diet - low sodium heart healthy   Complete by:  As directed    Increase activity slowly   Complete  by:  As directed      Allergies as of 06/03/2018      Reactions   Norvasc [amlodipine Besylate] Swelling   ANGIOEDEMA   Nsaids Other (See Comments)   GI upset/ GI bleed      Medication List    STOP taking these medications   doxycycline 100 MG capsule Commonly known as:  VIBRAMYCIN     TAKE these medications   ADVANCED COLLAGEN PO Take 1 scoop by mouth daily.   carvedilol 3.125 MG tablet Commonly known as:  COREG Take 3.125 mg by mouth 2 (two) times daily with a meal.   cyclobenzaprine 10 MG tablet Commonly known as:  FLEXERIL Take 0.5-1 tablets (5-10 mg total) by mouth 3 (three) times daily as needed for muscle spasms. Do not mix with narcotics. May cause drowsiness. Take in the daytime.   dicyclomine 20 MG tablet Commonly known as:  BENTYL TAKE 1 TABLET BY MOUTH TWICE A DAY What changed:    how much to take  how to take this  when to take this   ferrous sulfate 325 (65 FE) MG tablet Take 1 tablet (325 mg total) by mouth daily with breakfast.   HYDROcodone-acetaminophen 5-325 MG tablet Commonly known as:  NORCO Take 1 tablet by mouth every 6 (six) hours as needed for severe pain (May cause constipation). For severe pain only. Do not mix with alcohol, benzodiazepines, muscle relaxer. No refills without office visit. Take in the night time.   insulin glargine 100 UNIT/ML injection Commonly known  as:  LANTUS Inject 45 Units into the skin at bedtime.   TOUJEO SOLOSTAR [place] Inject 35 Units into the skin at bedtime.   INVOKANA 100 MG Tabs tablet Generic drug:  canagliflozin Take 100 mg by mouth daily.   losartan-hydrochlorothiazide 100-25 MG tablet Commonly known as:  HYZAAR TAKE 1 TABLET BY MOUTH DAILY. NEEDS OFFICE VISIT   Lutein-Zeaxanthin 20-1 MG Caps Take 1 capsule by mouth daily.   metFORMIN 1000 MG tablet Commonly known as:  GLUCOPHAGE Take 1 tablet (1,000 mg total) by mouth 2 (two) times daily with a meal. PATIENT NEEDS OFFICE VISIT FOR ADDITIONAL  REFILLS What changed:    how much to take  when to take this  additional instructions   multivitamin with minerals tablet Take 1 tablet by mouth daily.   Omega 3 1000 MG Caps Take 1,000 mg by mouth daily.   traMADol 50 MG tablet Commonly known as:  ULTRAM Take 1-2 tablets (50-100 mg total) by mouth every 8 (eight) hours as needed. Take in the daytime.   Turmeric 500 MG Tabs Take 1 tablet by mouth at bedtime.      Allergies  Allergen Reactions  . Norvasc [Amlodipine Besylate] Swelling    ANGIOEDEMA  . Nsaids Other (See Comments)    GI upset/ GI bleed   Follow-up Information    Shawnee Knapp, MD Follow up in 1 week(s).   Specialty:  Family Medicine Contact information: [place] [address] [phone]            The results of significant diagnostics from this hospitalization (including imaging, microbiology, ancillary and laboratory) are listed below for reference.    Significant Diagnostic Studies: Ct Abdomen Pelvis W Contrast  Result Date: 06/02/2018 CLINICAL DATA:  Lower gastrointestinal bleeding. Polycystic ovarian disease. EXAM: CT ABDOMEN AND PELVIS WITH CONTRAST TECHNIQUE: Multidetector CT imaging of the abdomen and pelvis was performed using the standard protocol following bolus administration of intravenous contrast. CONTRAST:  112mL OMNIPAQUE IOHEXOL 300 MG/ML  SOLN COMPARISON:  11/30/2016 FINDINGS: Lower chest: Stable mild cardiomegaly with bibasilar dependent atelectasis. No pericardial effusion. Hepatobiliary: Hepatic steatosis. Cholecystectomy. No mass or biliary dilatation. Pancreas: Normal Spleen: Normal Adrenals/Urinary Tract: Bilateral simple appearing renal cysts, the largest measuring 2.5 x 2.3 x 2.1 cm in the interpolar right kidney and 1.9 x 2.1 x 2.3 cm in the lower pole left kidney. Smaller too small to characterize hypodensities are noted within the left kidney. No nephrolithiasis nor solid enhancing mass lesions. Right  adrenalectomy with surgical clips in the adrenal bed. Left adrenal gland is normal. Stomach/Bowel: The stomach is decompressed. The duodenal sweep and ligament of Treitz position are normal. Nonobstructed, nondistended small bowel. Normal appendix, distal and terminal ileum. There is extensive colonic diverticulosis without acute diverticulitis. Vascular/Lymphatic: No abdominal aortic aneurysm. No lymphadenopathy. Reproductive: Redemonstration of fibroid uterus with several coarsely calcified intramural and subserosal uterine leiomyomata again demonstrated without significant interval change. No adnexal mass. Other: No free air nor free fluid. Musculoskeletal: No acute or significant osseous findings. IMPRESSION: 1. Extensive colonic diverticulosis without acute diverticulitis. Suspect diverticular disease as a leading etiology for the patient's lower gastrointestinal bleeding. 2. Fibroid uterus.  No adnexal mass. 3. Status post right adrenalectomy. 4. Simple appearing bilateral renal cysts. Some are too small to further characterize however within the left kidney. 5. Hepatic steatosis. 6. Mild cardiomegaly. Electronically Signed   By: Ashley Royalty M.D.   On: 06/02/2018 20:47    Microbiology: No results found for this or  any previous visit (from the past 240 hour(s)).   Labs: Basic Metabolic Panel: Recent Labs  Lab 06/02/18 1435 06/03/18 0011  NA 135 139  K 3.1* 3.2*  CL 99 102  CO2 26 27  GLUCOSE 130* 99  BUN 10 9  CREATININE 0.75 0.73  CALCIUM 8.9 8.7*   Liver Function Tests: Recent Labs  Lab 06/02/18 1435 06/03/18 0011  AST 43* 42*  ALT 61* 53*  ALKPHOS 59 52  BILITOT 0.5 0.6  PROT 7.6 6.8  ALBUMIN 3.7 3.3*   Recent Labs  Lab 06/02/18 1435  LIPASE 35   No results for input(s): AMMONIA in the last 168 hours. CBC: Recent Labs  Lab 06/02/18 1435 06/03/18 0011 06/03/18 0752  WBC 8.9  --   --   HGB 13.5 12.3 12.4  HCT 43.1 39.8 40.4  MCV 69.7*  --   --   PLT 403*  --    --    Cardiac Enzymes: No results for input(s): CKTOTAL, CKMB, CKMBINDEX, TROPONINI in the last 168 hours. BNP: BNP (last 3 results) No results for input(s): BNP in the last 8760 hours.  ProBNP (last 3 results) No results for input(s): PROBNP in the last 8760 hours.  CBG: Recent Labs  Lab 06/02/18 2143 06/03/18 0000 06/03/18 0422 06/03/18 0759  GLUCAP 82 89 96 85       Signed:  Talissa Apple A MD.  [facility] 06/03/2018, 9:58 AM

## 2018-09-18 ENCOUNTER — Encounter: Payer: Self-pay | Admitting: Family Medicine

## 2018-10-21 ENCOUNTER — Other Ambulatory Visit: Payer: Self-pay | Admitting: Family Medicine

## 2018-10-21 DIAGNOSIS — Z1231 Encounter for screening mammogram for malignant neoplasm of breast: Secondary | ICD-10-CM

## 2018-10-24 ENCOUNTER — Ambulatory Visit
Admission: RE | Admit: 2018-10-24 | Discharge: 2018-10-24 | Disposition: A | Discharge status: Home / Self Care | Payer: 59 | Source: Ambulatory Visit | Attending: Family Medicine | Admitting: Family Medicine

## 2018-10-24 DIAGNOSIS — Z1231 Encounter for screening mammogram for malignant neoplasm of breast: Secondary | ICD-10-CM

## 2018-10-25 ENCOUNTER — Other Ambulatory Visit: Payer: Self-pay | Admitting: Family Medicine

## 2018-10-25 DIAGNOSIS — R928 Other abnormal and inconclusive findings on diagnostic imaging of breast: Secondary | ICD-10-CM

## 2018-10-28 ENCOUNTER — Ambulatory Visit
Admission: RE | Admit: 2018-10-28 | Discharge: 2018-10-28 | Disposition: A | Discharge status: Home / Self Care | Payer: 59 | Source: Ambulatory Visit | Attending: Family Medicine | Admitting: Family Medicine

## 2018-10-28 DIAGNOSIS — R928 Other abnormal and inconclusive findings on diagnostic imaging of breast: Secondary | ICD-10-CM

## 2019-05-17 DIAGNOSIS — A419 Sepsis, unspecified organism: Secondary | ICD-10-CM

## 2019-05-17 HISTORY — DX: Sepsis, unspecified organism: A41.9

## 2019-05-24 ENCOUNTER — Other Ambulatory Visit: Payer: Self-pay

## 2019-05-24 ENCOUNTER — Inpatient Hospital Stay (HOSPITAL_COMMUNITY)
Admission: EM | Admit: 2019-05-24 | Discharge: 2019-05-29 | DRG: 872 | Disposition: A | Discharge status: Home / Self Care | Payer: No Typology Code available for payment source | Attending: Family Medicine | Admitting: Family Medicine

## 2019-05-24 ENCOUNTER — Emergency Department (HOSPITAL_COMMUNITY): Payer: No Typology Code available for payment source

## 2019-05-24 DIAGNOSIS — I81 Portal vein thrombosis: Secondary | ICD-10-CM

## 2019-05-24 DIAGNOSIS — A020 Salmonella enteritis: Secondary | ICD-10-CM | POA: Diagnosis present

## 2019-05-24 DIAGNOSIS — A419 Sepsis, unspecified organism: Secondary | ICD-10-CM | POA: Diagnosis present

## 2019-05-24 DIAGNOSIS — N183 Chronic kidney disease, stage 3 (moderate): Secondary | ICD-10-CM | POA: Diagnosis present

## 2019-05-24 DIAGNOSIS — K55069 Acute infarction of intestine, part and extent unspecified: Secondary | ICD-10-CM | POA: Diagnosis present

## 2019-05-24 DIAGNOSIS — R509 Fever, unspecified: Secondary | ICD-10-CM

## 2019-05-24 DIAGNOSIS — Z20828 Contact with and (suspected) exposure to other viral communicable diseases: Secondary | ICD-10-CM | POA: Diagnosis present

## 2019-05-24 DIAGNOSIS — R109 Unspecified abdominal pain: Secondary | ICD-10-CM | POA: Diagnosis present

## 2019-05-24 DIAGNOSIS — I82890 Acute embolism and thrombosis of other specified veins: Secondary | ICD-10-CM | POA: Diagnosis not present

## 2019-05-24 DIAGNOSIS — Z794 Long term (current) use of insulin: Secondary | ICD-10-CM | POA: Diagnosis not present

## 2019-05-24 DIAGNOSIS — IMO0002 Reserved for concepts with insufficient information to code with codable children: Secondary | ICD-10-CM | POA: Diagnosis present

## 2019-05-24 DIAGNOSIS — R000 Tachycardia, unspecified: Secondary | ICD-10-CM | POA: Diagnosis not present

## 2019-05-24 DIAGNOSIS — Z9049 Acquired absence of other specified parts of digestive tract: Secondary | ICD-10-CM

## 2019-05-24 DIAGNOSIS — I129 Hypertensive chronic kidney disease with stage 1 through stage 4 chronic kidney disease, or unspecified chronic kidney disease: Secondary | ICD-10-CM | POA: Diagnosis present

## 2019-05-24 DIAGNOSIS — Z86018 Personal history of other benign neoplasm: Secondary | ICD-10-CM | POA: Diagnosis not present

## 2019-05-24 DIAGNOSIS — R1032 Left lower quadrant pain: Secondary | ICD-10-CM

## 2019-05-24 DIAGNOSIS — E119 Type 2 diabetes mellitus without complications: Secondary | ICD-10-CM | POA: Diagnosis not present

## 2019-05-24 DIAGNOSIS — E1159 Type 2 diabetes mellitus with other circulatory complications: Secondary | ICD-10-CM | POA: Diagnosis present

## 2019-05-24 DIAGNOSIS — E785 Hyperlipidemia, unspecified: Secondary | ICD-10-CM | POA: Diagnosis present

## 2019-05-24 DIAGNOSIS — Z79899 Other long term (current) drug therapy: Secondary | ICD-10-CM | POA: Diagnosis not present

## 2019-05-24 DIAGNOSIS — E1165 Type 2 diabetes mellitus with hyperglycemia: Secondary | ICD-10-CM | POA: Diagnosis present

## 2019-05-24 DIAGNOSIS — Z886 Allergy status to analgesic agent status: Secondary | ICD-10-CM | POA: Diagnosis not present

## 2019-05-24 DIAGNOSIS — I10 Essential (primary) hypertension: Secondary | ICD-10-CM

## 2019-05-24 DIAGNOSIS — E1122 Type 2 diabetes mellitus with diabetic chronic kidney disease: Secondary | ICD-10-CM | POA: Diagnosis present

## 2019-05-24 DIAGNOSIS — E896 Postprocedural adrenocortical (-medullary) hypofunction: Secondary | ICD-10-CM | POA: Diagnosis not present

## 2019-05-24 DIAGNOSIS — R197 Diarrhea, unspecified: Secondary | ICD-10-CM | POA: Diagnosis not present

## 2019-05-24 DIAGNOSIS — Z8719 Personal history of other diseases of the digestive system: Secondary | ICD-10-CM | POA: Diagnosis not present

## 2019-05-24 DIAGNOSIS — D72829 Elevated white blood cell count, unspecified: Secondary | ICD-10-CM | POA: Diagnosis not present

## 2019-05-24 DIAGNOSIS — K55059 Acute (reversible) ischemia of intestine, part and extent unspecified: Secondary | ICD-10-CM | POA: Diagnosis not present

## 2019-05-24 DIAGNOSIS — Z833 Family history of diabetes mellitus: Secondary | ICD-10-CM

## 2019-05-24 DIAGNOSIS — Z888 Allergy status to other drugs, medicaments and biological substances status: Secondary | ICD-10-CM | POA: Diagnosis not present

## 2019-05-24 DIAGNOSIS — K5793 Diverticulitis of intestine, part unspecified, without perforation or abscess with bleeding: Secondary | ICD-10-CM | POA: Diagnosis not present

## 2019-05-24 LAB — CBC WITH DIFFERENTIAL/PLATELET
Abs Immature Granulocytes: 0.05 10*3/uL (ref 0.00–0.07)
Basophils Absolute: 0.1 10*3/uL (ref 0.0–0.1)
Basophils Relative: 1 %
Eosinophils Absolute: 0.1 10*3/uL (ref 0.0–0.5)
Eosinophils Relative: 1 %
HCT: 43.3 % (ref 36.0–46.0)
Hemoglobin: 13.7 g/dL (ref 12.0–15.0)
Immature Granulocytes: 0 %
Lymphocytes Relative: 16 %
Lymphs Abs: 2.1 10*3/uL (ref 0.7–4.0)
MCH: 22 pg — ABNORMAL LOW (ref 26.0–34.0)
MCHC: 31.6 g/dL (ref 30.0–36.0)
MCV: 69.5 fL — ABNORMAL LOW (ref 80.0–100.0)
Monocytes Absolute: 1.7 10*3/uL — ABNORMAL HIGH (ref 0.1–1.0)
Monocytes Relative: 13 %
Neutro Abs: 9.1 10*3/uL — ABNORMAL HIGH (ref 1.7–7.7)
Neutrophils Relative %: 69 %
Platelets: 263 10*3/uL (ref 150–400)
RBC: 6.23 MIL/uL — ABNORMAL HIGH (ref 3.87–5.11)
RDW: 16.6 % — ABNORMAL HIGH (ref 11.5–15.5)
WBC: 13.1 10*3/uL — ABNORMAL HIGH (ref 4.0–10.5)
nRBC: 0 % (ref 0.0–0.2)

## 2019-05-24 LAB — COMPREHENSIVE METABOLIC PANEL
ALT: 88 U/L — ABNORMAL HIGH (ref 0–44)
AST: 75 U/L — ABNORMAL HIGH (ref 15–41)
Albumin: 3.7 g/dL (ref 3.5–5.0)
Alkaline Phosphatase: 66 U/L (ref 38–126)
Anion gap: 15 (ref 5–15)
BUN: 18 mg/dL (ref 6–20)
CO2: 24 mmol/L (ref 22–32)
Calcium: 9.5 mg/dL (ref 8.9–10.3)
Chloride: 93 mmol/L — ABNORMAL LOW (ref 98–111)
Creatinine, Ser: 1.14 mg/dL — ABNORMAL HIGH (ref 0.44–1.00)
GFR calc Af Amer: >60 mL/min (ref >60)
GFR calc non Af Amer: 53 mL/min — ABNORMAL LOW (ref >60)
Glucose, Bld: 175 mg/dL — ABNORMAL HIGH (ref 70–99)
Potassium: 3.8 mmol/L (ref 3.5–5.1)
Sodium: 132 mmol/L — ABNORMAL LOW (ref 135–145)
Total Bilirubin: 0.8 mg/dL (ref 0.3–1.2)
Total Protein: 8.2 g/dL — ABNORMAL HIGH (ref 6.5–8.1)

## 2019-05-24 LAB — URINALYSIS, ROUTINE W REFLEX MICROSCOPIC
Bilirubin Urine: NEGATIVE
Glucose, UA: >=500 mg/dL — AB
Hgb urine dipstick: NEGATIVE
Ketones, ur: 5 mg/dL — AB
Leukocytes,Ua: NEGATIVE
Nitrite: NEGATIVE
Protein, ur: NEGATIVE mg/dL
Specific Gravity, Urine: 1.02 (ref 1.005–1.030)
pH: 5 (ref 5.0–8.0)

## 2019-05-24 LAB — CBG MONITORING, ED: Glucose-Capillary: 109 mg/dL — ABNORMAL HIGH (ref 70–99)

## 2019-05-24 LAB — LACTIC ACID, PLASMA
Lactic Acid, Venous: 1.8 mmol/L (ref 0.5–1.9)
Lactic Acid, Venous: 1.1 mmol/L (ref 0.5–1.9)

## 2019-05-24 LAB — LIPASE, BLOOD: Lipase: 40 U/L (ref 11–51)

## 2019-05-24 LAB — SARS CORONAVIRUS 2 BY RT PCR (HOSPITAL ORDER, PERFORMED IN ~~LOC~~ HOSPITAL LAB): SARS Coronavirus 2: NEGATIVE

## 2019-05-24 LAB — PROCALCITONIN: Procalcitonin: 0.47 ng/mL

## 2019-05-24 MED ORDER — SODIUM CHLORIDE 0.9 % IV SOLN
2.0000 g | Freq: Once | INTRAVENOUS | Status: AC
  Administered 2019-05-24: 2 g via INTRAVENOUS
  Filled 2019-05-24: qty 2

## 2019-05-24 MED ORDER — ATORVASTATIN CALCIUM 10 MG PO TABS
10.0000 mg | ORAL_TABLET | Freq: Every day | ORAL | Status: DC
  Administered 2019-05-25 – 2019-05-29 (×5): 10 mg via ORAL
  Filled 2019-05-24 (×5): qty 1

## 2019-05-24 MED ORDER — SODIUM CHLORIDE 0.9 % IV SOLN
Freq: Once | INTRAVENOUS | Status: AC
  Administered 2019-05-24: 19:00:00 via INTRAVENOUS

## 2019-05-24 MED ORDER — INSULIN GLARGINE 100 UNIT/ML ~~LOC~~ SOLN
15.0000 [IU] | Freq: Every day | SUBCUTANEOUS | Status: DC
  Administered 2019-05-25 – 2019-05-29 (×5): 15 [IU] via SUBCUTANEOUS
  Filled 2019-05-24 (×5): qty 0.15

## 2019-05-24 MED ORDER — METRONIDAZOLE IN NACL 5-0.79 MG/ML-% IV SOLN
500.0000 mg | Freq: Three times a day (TID) | INTRAVENOUS | Status: DC
  Administered 2019-05-24 – 2019-05-27 (×8): 500 mg via INTRAVENOUS
  Filled 2019-05-24 (×8): qty 100

## 2019-05-24 MED ORDER — INSULIN ASPART 100 UNIT/ML ~~LOC~~ SOLN
0.0000 [IU] | SUBCUTANEOUS | Status: DC
  Administered 2019-05-25: 14:00:00 2 [IU] via SUBCUTANEOUS

## 2019-05-24 MED ORDER — ATORVASTATIN CALCIUM 10 MG PO TABS: 10.0000 mg | ORAL_TABLET | Freq: Every day | ORAL | Status: DC

## 2019-05-24 MED ORDER — ONDANSETRON HCL 4 MG/2ML IJ SOLN
4.0000 mg | Freq: Once | INTRAMUSCULAR | Status: AC
  Administered 2019-05-24: 4 mg via INTRAVENOUS
  Filled 2019-05-24: qty 2

## 2019-05-24 MED ORDER — SODIUM CHLORIDE 0.9 % IV SOLN
INTRAVENOUS | Status: DC
  Administered 2019-05-24 – 2019-05-28 (×10): via INTRAVENOUS

## 2019-05-24 MED ORDER — SODIUM CHLORIDE 0.9 % IV BOLUS
1000.0000 mL | Freq: Once | INTRAVENOUS | Status: AC
  Administered 2019-05-24: 1000 mL via INTRAVENOUS

## 2019-05-24 MED ORDER — MORPHINE SULFATE (PF) 4 MG/ML IV SOLN
4.0000 mg | Freq: Once | INTRAVENOUS | Status: AC
  Administered 2019-05-24: 4 mg via INTRAVENOUS
  Filled 2019-05-24: qty 1

## 2019-05-24 MED ORDER — SODIUM CHLORIDE 0.9 % IV SOLN: 2.0000 g | Freq: Three times a day (TID) | INTRAVENOUS | Status: DC

## 2019-05-24 MED ORDER — METRONIDAZOLE IN NACL 5-0.79 MG/ML-% IV SOLN
500.0000 mg | Freq: Once | INTRAVENOUS | Status: AC
  Administered 2019-05-24: 500 mg via INTRAVENOUS
  Filled 2019-05-24: qty 100

## 2019-05-24 MED ORDER — ACETAMINOPHEN 325 MG PO TABS
650.0000 mg | ORAL_TABLET | Freq: Once | ORAL | Status: AC
  Administered 2019-05-24: 650 mg via ORAL
  Filled 2019-05-24: qty 2

## 2019-05-24 MED ORDER — HEPARIN SODIUM (PORCINE) 5000 UNIT/ML IJ SOLN
5000.0000 [IU] | Freq: Three times a day (TID) | INTRAMUSCULAR | Status: DC
  Filled 2019-05-24 (×3): qty 1

## 2019-05-24 MED ORDER — IOHEXOL 350 MG/ML SOLN
100.0000 mL | Freq: Once | INTRAVENOUS | Status: AC | PRN
  Administered 2019-05-24: 100 mL via INTRAVENOUS

## 2019-05-24 MED ORDER — VANCOMYCIN HCL 10 G IV SOLR
2000.0000 mg | Freq: Once | INTRAVENOUS | Status: AC
  Administered 2019-05-24: 2000 mg via INTRAVENOUS
  Filled 2019-05-24: qty 2000

## 2019-05-24 MED ORDER — VANCOMYCIN HCL IN DEXTROSE 1-5 GM/200ML-% IV SOLN: 1000.0000 mg | Freq: Once | INTRAVENOUS | Status: DC

## 2019-05-24 MED ORDER — ACETAMINOPHEN 325 MG PO TABS
650.0000 mg | ORAL_TABLET | Freq: Four times a day (QID) | ORAL | Status: DC | PRN
  Administered 2019-05-25 – 2019-05-28 (×6): 650 mg via ORAL
  Filled 2019-05-24 (×6): qty 2

## 2019-05-24 MED ORDER — ONDANSETRON HCL 4 MG PO TABS: 4.0000 mg | ORAL_TABLET | Freq: Four times a day (QID) | ORAL | Status: DC | PRN

## 2019-05-24 MED ORDER — ONDANSETRON HCL 4 MG/2ML IJ SOLN
4.0000 mg | Freq: Four times a day (QID) | INTRAMUSCULAR | Status: DC | PRN
  Administered 2019-05-25: 4 mg via INTRAVENOUS
  Filled 2019-05-24: qty 2

## 2019-05-24 MED ORDER — SODIUM CHLORIDE 0.9 % IV SOLN
2.0000 g | INTRAVENOUS | Status: DC
  Administered 2019-05-25 – 2019-05-27 (×4): 2 g via INTRAVENOUS
  Filled 2019-05-24 (×5): qty 20

## 2019-05-24 MED ORDER — ACETAMINOPHEN 650 MG RE SUPP: 650.0000 mg | Freq: Four times a day (QID) | RECTAL | Status: DC | PRN

## 2019-05-24 MED ORDER — SODIUM CHLORIDE 0.9 % IV BOLUS: 500.0000 mL | Freq: Once | INTRAVENOUS | Status: DC

## 2019-05-24 MED ORDER — MORPHINE SULFATE (PF) 2 MG/ML IV SOLN
2.0000 mg | INTRAVENOUS | Status: DC | PRN
  Administered 2019-05-25 – 2019-05-28 (×4): 2 mg via INTRAVENOUS
  Filled 2019-05-24 (×4): qty 1

## 2019-05-24 MED ORDER — VANCOMYCIN HCL 10 G IV SOLR: 1250.0000 mg | INTRAVENOUS | Status: DC

## 2019-05-24 NOTE — H&P (Signed)
History and Physical    Carol Mueller YKD:[id number] DOB: 09-28-1961 DOA: 05/24/2019  PCP: Shawnee Knapp, MD  Patient coming from: Home  I have personally briefly reviewed patient's old medical records in [place]  Chief Complaint: Fevers, chills  HPI: Carol Mueller is a 58 y.o. female with medical history significant of unilateral adrenalectomy for cushing's syndrome, DM2 on insulin, HTN, HLD, diverticulitis.  Patient presents to the ED with c/o fevers, chills, body aches, nausea for past 4 days.  No SOB, no rash, no cough, no dysuria.  Trying to treat at home with ibuprofen.  Was concerned she might have COVID so presented to ED for further evaluation.   ED Course: Got empiric cefepime, flagyl, and vanc as well as 1L NS bolus and tylenol for Fever 102.2, RR 28, HR 102, WBC 13.1.  COVID negative. CXR neg UA neg BCx, pending  In the ED she was noted to have LLQ TTP on exam, she says she really didn't notice this for past couple of days until the abdominal exam in ED.  CT abd pelvis showed suspected nonocclusive thrombus within a distal branch of the inferior mesenteric vein with mild surrounding inflammatory changes.  Vasc surgery consulted and hospitalist asked to admit.   Review of Systems: As per HPI otherwise 10 point review of systems negative.   Past Medical History:  Diagnosis Date  . Anemia   . Cushing's syndrome ([place])    due to adrenal tumor  . Diabetes mellitus without complication (HCC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  . Diverticulitis   . Diverticulosis   . GI bleed   . Hyperlipidemia   . Hypertension   . Polycystic ovary disease     Past Surgical History:  Procedure Laterality Date  . ADRENALECTOMY  1996  . CHOLECYSTECTOMY    . ESOPHAGOGASTRODUODENOSCOPY (EGD) WITH PROPOFOL N/A 07/16/2015   Procedure: ESOPHAGOGASTRODUODENOSCOPY (EGD) WITH PROPOFOL;  Surgeon: [name], MD;  Location: [facility];  Service: Endoscopy;  Laterality: N/A;     reports that she has never smoked. She has never used smokeless tobacco. She reports that she does not drink alcohol or use drugs.  Allergies  Allergen Reactions  . Norvasc [Amlodipine Besylate] Swelling    ANGIOEDEMA  . Nsaids Other (See Comments)    GI upset/ GI bleed    Family History  Problem Relation Age of Onset  . Hypertension Mother   . Cancer Mother        ?uterine or ovarian cancer  . Hypertension Father   . Diabetes Father   . Hypertension Sister   . Diabetes Sister   . Hypertension Brother   . Diabetes Brother   . Hypertension Maternal Grandfather   . Diabetes Paternal Grandmother   . Breast cancer Maternal Grandmother   . Colon cancer Neg Hx      Prior to Admission medications   Medication Sig Start Date End Date Taking? Authorizing Provider  atorvastatin (LIPITOR) 10 MG tablet Take 10 mg by mouth daily. 03/06/19  Yes [provider]  carvedilol (COREG) 3.125 MG tablet Take 3.125 mg by mouth 2 (two) times daily with a meal.   Yes [provider]  dicyclomine (BENTYL) 20 MG tablet TAKE 1 TABLET BY MOUTH TWICE A DAY Patient taking differently: Take 20 mg by mouth as needed (Stomach pain).  06/02/17  Yes [name], MD  hydrochlorothiazide (HYDRODIURIL) 25 MG tablet Take 25 mg by mouth daily. 04/14/19  Yes [provider]  Insulin Glargine (BASAGLAR KWIKPEN) 100 UNIT/ML SOPN Inject 30 Units into the skin daily. 05/15/19  Yes [provider]  JARDIANCE 10 MG TABS tablet Take 10 mg by mouth daily. 03/15/19  Yes [provider]  Lutein-Zeaxanthin 20-1 MG CAPS Take 1 capsule by mouth daily.   Yes [provider]  metFORMIN (GLUCOPHAGE) 1000 MG tablet Take 1 tablet (1,000 mg total) by mouth 2 (two) times daily with a meal. PATIENT NEEDS OFFICE VISIT FOR ADDITIONAL REFILLS Patient taking differently: Take 1,000 mg by mouth 2 (two) times daily with a meal.  09/07/13  Yes Bailey Mech E, PA-C  Multiple  Vitamins-Minerals (MULTIVITAMIN WITH MINERALS) tablet Take 1 tablet by mouth daily.   Yes [provider]  olmesartan (BENICAR) 40 MG tablet Take 40 mg by mouth daily. 04/13/19  Yes [provider]  Omega 3 1000 MG CAPS Take 1,000 mg by mouth daily.   Yes [provider]  Specialty Vitamins Products (ADVANCED COLLAGEN PO) Take 1 scoop by mouth once a week.    Yes [provider]    Physical Exam: Vitals:   05/24/19 1745 05/24/19 1842 05/24/19 1930 05/24/19 2000  BP:  119/64 132/85 114/75  Pulse: (!) 106 (!) 112 (!) 103 (!) 102  Resp: (!) 27 17 (!) 21 (!) 28  Temp:  99.5 F (37.5 C)    TempSrc:  Oral    SpO2: 95% 96% 93% 91%  Weight:      Height:        Constitutional: NAD, calm, comfortable Eyes: PERRL, lids and conjunctivae normal ENMT: Mucous membranes are moist. Posterior pharynx clear of any exudate or lesions.Normal dentition.  Neck: normal, supple, no masses, no thyromegaly Respiratory: clear to auscultation bilaterally, no wheezing, no crackles. Normal respiratory effort. No accessory muscle use.  Cardiovascular: Mild tachycardia, regular Abdomen: LLQ TTP Musculoskeletal: no clubbing / cyanosis. No joint deformity upper and lower extremities. Good ROM, no contractures. Normal muscle tone.  Skin: No rashes, no diabetic foot ulcer. Neurologic: CN 2-12 grossly intact. Sensation intact, DTR normal. Strength 5/5 in all 4.  Psychiatric: Normal judgment and insight. Alert and oriented x 3. Normal mood.    Labs on Admission: I have personally reviewed following labs and imaging studies  CBC: Recent Labs  Lab 05/24/19 1327  WBC 13.1*  NEUTROABS 9.1*  HGB 13.7  HCT 43.3  MCV 69.5*  PLT 478   Basic Metabolic Panel: Recent Labs  Lab 05/24/19 1327  NA 132*  K 3.8  CL 93*  CO2 24  GLUCOSE 175*  BUN 18  CREATININE 1.14*  CALCIUM 9.5   GFR: Estimated Creatinine Clearance: 65.6 mL/min (A) (by C-G formula based on SCr of 1.14 mg/dL  (H)). Liver Function Tests: Recent Labs  Lab 05/24/19 1327  AST 75*  ALT 88*  ALKPHOS 66  BILITOT 0.8  PROT 8.2*  ALBUMIN 3.7   Recent Labs  Lab 05/24/19 1327  LIPASE 40   No results for input(s): AMMONIA in the last 168 hours. Coagulation Profile: No results for input(s): INR, PROTIME in the last 168 hours. Cardiac Enzymes: No results for input(s): CKTOTAL, CKMB, CKMBINDEX, TROPONINI in the last 168 hours. BNP (last 3 results) No results for input(s): PROBNP in the last 8760 hours. HbA1C: No results for input(s): HGBA1C in the last 72 hours. CBG: Recent Labs  Lab 05/24/19 2035  GLUCAP 109*   Lipid Profile: No results for input(s): CHOL, HDL, LDLCALC, TRIG, CHOLHDL, LDLDIRECT in the last 72 hours. Thyroid  Function Tests: No results for input(s): TSH, T4TOTAL, FREET4, T3FREE, THYROIDAB in the last 72 hours. Anemia Panel: No results for input(s): VITAMINB12, FOLATE, FERRITIN, TIBC, IRON, RETICCTPCT in the last 72 hours. Urine analysis:    Component Value Date/Time   COLORURINE YELLOW 05/24/2019 1430   APPEARANCEUR HAZY (A) 05/24/2019 1430   LABSPEC 1.020 05/24/2019 1430   PHURINE 5.0 05/24/2019 1430   GLUCOSEU >=500 (A) 05/24/2019 1430   HGBUR NEGATIVE 05/24/2019 1430   BILIRUBINUR NEGATIVE 05/24/2019 1430   BILIRUBINUR neg 08/25/2014 1157   KETONESUR 5 (A) 05/24/2019 1430   PROTEINUR NEGATIVE 05/24/2019 1430   UROBILINOGEN 0.2 08/25/2014 1157   NITRITE NEGATIVE 05/24/2019 1430   LEUKOCYTESUR NEGATIVE 05/24/2019 1430    Radiological Exams on Admission: Ct Angio Chest Pe W And/or Wo Contrast  Result Date: 05/24/2019 CLINICAL DATA:  Fever and body aches since Sunday. EXAM: CT ANGIOGRAPHY CHEST CT ABDOMEN AND PELVIS WITH CONTRAST TECHNIQUE: Multidetector CT imaging of the chest was performed using the standard protocol during bolus administration of intravenous contrast. Multiplanar CT image reconstructions and MIPs were obtained to evaluate the vascular anatomy.  Multidetector CT imaging of the abdomen and pelvis was performed using the standard protocol during bolus administration of intravenous contrast. CONTRAST:  194mL OMNIPAQUE IOHEXOL 350 MG/ML SOLN COMPARISON:  Chest x-ray from same day. CT abdomen pelvis dated June 02, 2018. FINDINGS: CTA CHEST FINDINGS Cardiovascular: Satisfactory opacification of the pulmonary arteries to the segmental level. No evidence of pulmonary embolism. Normal heart size. No pericardial effusion. No thoracic aortic aneurysm or dissection. Mediastinum/Nodes: No enlarged mediastinal, hilar, or axillary lymph nodes. Thyroid gland, trachea, and esophagus demonstrate no significant findings. Lungs/Pleura: No focal consolidation, pleural effusion, or pneumothorax. Two 4 mm nodules in the right upper lobe (series 11, images 26 and 33). Musculoskeletal: No chest wall abnormality. No acute or significant osseous findings. Review of the MIP images confirms the above findings. CT ABDOMEN AND PELVIS FINDINGS Hepatobiliary: Unchanged diffuse hepatic steatosis. No focal liver abnormality. Prior cholecystectomy. No biliary dilatation. Pancreas: Unremarkable. No pancreatic ductal dilatation or surrounding inflammatory changes. Spleen: Normal in size without focal abnormality. Adrenals/Urinary Tract: Prior right adrenalectomy. The left adrenal gland is unremarkable. Unchanged bilateral renal simple cysts. No renal calculi or hydronephrosis. The bladder is unremarkable. Stomach/Bowel: Stomach is within normal limits. Appendix appears normal. No evidence of bowel wall thickening, distention, or inflammatory changes. Extensive colonic diverticulosis again noted. 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 No enlarged abdominal or pelvic lymph nodes. Reproductive: Multiple calcified uterine fibroids again  noted. No adnexal mass. Other: Unchanged tiny fat containing right inguinal and umbilical hernias. No free fluid or pneumoperitoneum. Musculoskeletal: No acute or significant osseous findings. Review of the MIP images confirms the above findings. IMPRESSION: Chest: 1. No evidence of pulmonary embolism. No acute intrathoracic process. 2. Two 4 mm pulmonary nodules in the right upper lobe. No follow-up needed if patient is low-risk (and has no known or suspected primary neoplasm). Non-contrast chest CT can be considered in 12 months if patient is high-risk. This recommendation follows the consensus statement: Guidelines for Management of Incidental Pulmonary Nodules Detected on CT Images: From the Fleischner Society 2017; Radiology 2017; 284:228-243. Abdomen and pelvis: 1. 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 2. Extensive colonic diverticulosis again noted. No evidence of acute diverticulitis. 3. Unchanged hepatic steatosis.  4. Unchanged enlarged fibroid uterus. Electronically Signed   By: Titus Dubin M.D.   On: 05/24/2019 16:47   Ct Abdomen Pelvis W Contrast  Result Date: 05/24/2019 CLINICAL DATA:  Fever and body aches since Sunday. EXAM: CT ANGIOGRAPHY CHEST CT ABDOMEN AND PELVIS WITH CONTRAST TECHNIQUE: Multidetector CT imaging of the chest was performed using the standard protocol during bolus administration of intravenous contrast. Multiplanar CT image reconstructions and MIPs were obtained to evaluate the vascular anatomy. Multidetector CT imaging of the abdomen and pelvis was performed using the standard protocol during bolus administration of intravenous contrast. CONTRAST:  137mL OMNIPAQUE IOHEXOL 350 MG/ML SOLN COMPARISON:  Chest x-ray from same day. CT abdomen pelvis dated June 02, 2018. FINDINGS: CTA CHEST FINDINGS Cardiovascular: Satisfactory opacification of the pulmonary arteries to the segmental level.  No evidence of pulmonary embolism. Normal heart size. No pericardial effusion. No thoracic aortic aneurysm or dissection. Mediastinum/Nodes: No enlarged mediastinal, hilar, or axillary lymph nodes. Thyroid gland, trachea, and esophagus demonstrate no significant findings. Lungs/Pleura: No focal consolidation, pleural effusion, or pneumothorax. Two 4 mm nodules in the right upper lobe (series 11, images 26 and 33). Musculoskeletal: No chest wall abnormality. No acute or significant osseous findings. Review of the MIP images confirms the above findings. CT ABDOMEN AND PELVIS FINDINGS Hepatobiliary: Unchanged diffuse hepatic steatosis. No focal liver abnormality. Prior cholecystectomy. No biliary dilatation. Pancreas: Unremarkable. No pancreatic ductal dilatation or surrounding inflammatory changes. Spleen: Normal in size without focal abnormality. Adrenals/Urinary Tract: Prior right adrenalectomy. The left adrenal gland is unremarkable. Unchanged bilateral renal simple cysts. No renal calculi or hydronephrosis. The bladder is unremarkable. Stomach/Bowel: Stomach is within normal limits. Appendix appears normal. No evidence of bowel wall thickening, distention, or inflammatory changes. Extensive colonic diverticulosis again noted. 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 No enlarged abdominal or pelvic lymph nodes. Reproductive: Multiple calcified uterine fibroids again noted. No adnexal mass. Other: Unchanged tiny fat containing right inguinal and umbilical hernias. No free fluid or pneumoperitoneum. Musculoskeletal: No acute or significant osseous findings. Review of the MIP images confirms the above findings. IMPRESSION: Chest: 1. No evidence of pulmonary embolism. No acute intrathoracic process. 2. Two 4 mm pulmonary nodules in the right upper lobe. No  follow-up needed if patient is low-risk (and has no known or suspected primary neoplasm). Non-contrast chest CT can be considered in 12 months if patient is high-risk. This recommendation follows the consensus statement: Guidelines for Management of Incidental Pulmonary Nodules Detected on CT Images: From the Fleischner Society 2017; Radiology 2017; 284:228-243. Abdomen and pelvis: 1. 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 2. Extensive colonic diverticulosis again noted. No evidence of acute diverticulitis. 3. Unchanged hepatic steatosis. 4. Unchanged enlarged fibroid uterus. Electronically Signed   By: Titus Dubin M.D.   On: 05/24/2019 16:47   Dg Chest Port 1 View  Result Date: 05/24/2019 CLINICAL DATA:  Fever and body aches. EXAM: PORTABLE CHEST 1 VIEW COMPARISON:  None. FINDINGS: The heart size and mediastinal contours are within normal limits. Both lungs are clear. The visualized skeletal structures are unremarkable. IMPRESSION: No active cardiopulmonary disease. Electronically Signed   By: Abelardo Diesel M.D.   On: 05/24/2019 13:37    EKG: Independently reviewed.  Assessment/Plan Principal Problem:   Sepsis ([place]) Active Problems:   Essential hypertension   Abdominal pain  Insulin-requiring or dependent type II diabetes mellitus ([place])   Mesenteric vein thrombosis    1. Sepsis - ? Related to mesenteric vein thrombosis 1. Radiologist called on CT 2. Though Dr. Donnetta Hutching thinks less likely: 1. No need for anticoagulation per his note 2. See his note for details 3. CXR, UA, skin, and COVID all negative for alternative source 4. Abd TTP, otherwise no localizing symptoms 5. Will put on rocephin and flagyl for suspected intra-abdominal source for the moment 6. Lactate only 1.8 7. NPO 8. Zofran PRN nausea, morphine PRN pain, tylenol PRN fever 9. Tele monitor 10. IVF: NS at 125 cc/hr 11. Repeat  CBC/CMP in AM 12. BCx pending 2. DM2 - 1. Half home glargine (so lantus 15u daily) 2. Sensitive SSI Q4H 3. HTN - 1. Holding home BP meds  DVT prophylaxis: heparin [place] Code Status: Full Family Communication: No family in room Disposition Plan: Home after admit Consults called: Vasc surgery Admission status: Admit to inpatient  Severity of Illness: The appropriate patient status for this patient is INPATIENT. Inpatient status is judged to be reasonable and necessary in order to provide the required intensity of service to ensure the patient's safety. The patient's presenting symptoms, physical exam findings, and initial radiographic and laboratory data in the context of their chronic comorbidities is felt to place them at high risk for further clinical deterioration. Furthermore, it is not anticipated that the patient will be medically stable for discharge from the hospital within 2 midnights of admission. The following factors support the patient status of inpatient.   IP status for treatment of sepsis, concern for relation to IMV thrombosis.   * I certify that at the point of admission it is my clinical judgment that the patient will require inpatient hospital care spanning beyond 2 midnights from the point of admission due to high intensity of service, high risk for further deterioration and high frequency of surveillance required.*    ,  M. DO [facility]  How to contact the [facility] Attending or Consulting provider [place] or covering provider during after hours [place], for this patient?  1. Check the care team in [facility] and look for a) attending/consulting [facility] provider listed and b) the [facility] team listed 2. Log into [email or web address]  Amion Physician Scheduling and messaging for groups and whole hospitals  On call and physician scheduling software for group practices, residents, hospitalists and other medical providers for call, clinic, rotation and shift schedules. OnCall  [employer] is a hospital-wide system for scheduling doctors and paging doctors on call. EasyPlot is for scientific plotting and data analysis.  [email or web address]  and use [place]'s universal password to access. If you do not have the password, please contact the hospital operator.  3. Locate the [facility] provider you are looking for under [facility] and page to a number that you can be directly reached. 4. If you still have difficulty reaching the provider, please page the [facility] (Director on Call) for the Hospitalists listed on amion for assistance.  05/24/2019, 9:29 PM

## 2019-05-24 NOTE — Consult Note (Signed)
[facility] [place]  Patient name: Carol Mueller MRN: [id number] DOB: [birth year] Sex: female    HPI: Carol Mueller is a 58 y.o. 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  Patient reports that she is been having fevers and feeling poorly for the past 4 days.  She reports that this is unusual for her.  She was concerned that she may have COVID virus and presented to the emergency room for further evaluation.  She reports that she has been running fevers as high as 103.  She had been treating herself with ibuprofen.  She does have a history of Cushing's syndrome with prior unilateral adrenalectomy.  She is insulin-dependent diabetes.  She does have a long history of diverticulosis and diverticulitis.  She has had multiple GI bleeds related to her diverticulosis.  She denied any abdominal pain prior to presentation to the hospital and has had no GI bleeding.  She reports that she is felt fatigued with body aches since Saturday when the fever began and has had little appetite.  After she presented to the hospital and on physical exam she was noted to have some left lower quadrant tenderness but denied any pain and did not since this prior to presentation.  She has no prior history of thrombotic events.  Past Medical History:  Diagnosis Date  . Anemia   . 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 Diabetes mellitus without complication (HCC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  . Diverticulitis   . Diverticulosis   . GI bleed   . Hyperlipidemia   . Hypertension   . Polycystic ovary disease     Family History  Problem Relation Age of Onset  . Hypertension Mother   . Cancer Mother        ?uterine or ovarian cancer  . Hypertension Father   . Diabetes Father   . Hypertension Sister   . Diabetes Sister   . Hypertension Brother   . Diabetes Brother   . Hypertension Maternal  Grandfather   . Diabetes Paternal Grandmother   . Breast cancer Maternal Grandmother   . Colon cancer Neg Hx     SOCIAL HISTORY: Social History   Tobacco Use  . Smoking status: Never Smoker  . Smokeless tobacco: Never Used  Substance Use Topics  . Alcohol use: No    Allergies  Allergen Reactions  . Norvasc [Amlodipine Besylate] Swelling    ANGIOEDEMA  . Nsaids Other (See Comments)    GI upset/ GI bleed    Current Facility-Administered Medications  Medication Dose Route Frequency Provider Last Rate Last Dose  . atorvastatin (LIPITOR) tablet 10 mg  10 mg Oral Daily Etta Quill, DO      . [START ON 05/25/2019] cefTRIAXone (ROCEPHIN) 2 g in sodium chloride 0.9 % 100 mL IVPB  2 g Intravenous Q24H Alcario Drought, Jared M, DO      . insulin aspart (novoLOG) injection 0-9 Units  0-9 Units Subcutaneous Q4H Jennette Kettle M, DO      . [START ON 05/25/2019] insulin glargine (LANTUS) injection 15 Units  15 Units Subcutaneous Daily Alcario Drought, Jared M, DO      . metroNIDAZOLE (FLAGYL) IVPB 500 mg  500 mg Intravenous Q8H Jennette Kettle M, DO      . morphine 2 MG/ML injection 2-4 mg  2-4 mg Intravenous Q4H PRN Etta Quill, DO       Current Outpatient Medications  Medication Sig Dispense Refill  . atorvastatin (LIPITOR) 10 MG tablet Take 10 mg by mouth daily.    . carvedilol (COREG) 3.125 MG tablet Take 3.125 mg by mouth 2 (two) times daily with a meal.    . dicyclomine (BENTYL) 20 MG tablet TAKE 1 TABLET BY MOUTH TWICE A DAY (Patient taking differently: Take 20 mg by mouth as needed (Stomach pain). ) 60 tablet 0  . hydrochlorothiazide (HYDRODIURIL) 25 MG tablet Take 25 mg by mouth daily.    . Insulin Glargine (BASAGLAR KWIKPEN) 100 UNIT/ML SOPN Inject 30 Units into the skin daily.    Marland Kitchen JARDIANCE 10 MG TABS tablet Take 10 mg by mouth daily.    . Lutein-Zeaxanthin 20-1 MG CAPS Take 1 capsule by mouth daily.    . metFORMIN (GLUCOPHAGE) 1000 MG tablet Take 1 tablet (1,000 mg total) by mouth 2  (two) times daily with a meal. PATIENT NEEDS OFFICE VISIT FOR ADDITIONAL REFILLS (Patient taking differently: Take 1,000 mg by mouth 2 (two) times daily with a meal. ) 60 tablet 0  . Multiple Vitamins-Minerals (MULTIVITAMIN WITH MINERALS) tablet Take 1 tablet by mouth daily.    Marland Kitchen olmesartan (BENICAR) 40 MG tablet Take 40 mg by mouth daily.    . Omega 3 1000 MG CAPS Take 1,000 mg by mouth daily.    Marland Kitchen Specialty Vitamins Products (ADVANCED COLLAGEN PO) Take 1 scoop by mouth once a week.       REVIEW OF SYSTEMS:  [X]  denotes positive finding, [ ]  denotes negative finding Cardiac  Comments:  Chest pain or chest pressure:    Shortness of breath upon exertion:    Short of breath when lying flat:    Irregular heart rhythm:        Vascular    Pain in calf, thigh, or hip brought on by ambulation:    Pain in feet at night that wakes you up from your sleep:     Blood clot in your veins:    Leg swelling:           PHYSICAL EXAM: Vitals:   05/24/19 1515 05/24/19 1530 05/24/19 1745 05/24/19 1842  BP: (!) 129/96 125/81  119/64  Pulse: (!) 106 (!) 103 (!) 106 (!) 112  Resp:   (!) 27 17  Temp:    99.5 F (37.5 C)  TempSrc:    Oral  SpO2: 95% 94% 95% 96%  Weight: 112.9 kg     Height: 5' 3.5" (1.613 m)       GENERAL: The patient is a well-nourished female, in no acute distress. The vital signs are documented above. CARDIOVASCULAR: 2+ radial and 2+ dorsalis pedis pulses bilaterally No rebound tenderness.  No tenderness in her abdomen other than mild to moderate tenderness in the left lower quadrant to deep palpation PULMONARY: There is good air exchange  MUSCULOSKELETAL: There are no major deformities or cyanosis. NEUROLOGIC: No focal weakness or paresthesias are detected. SKIN: There are no ulcers or rashes noted. PSYCHIATRIC: The patient has a normal affect.  DATA:  CT scan of the chest abdomen and pelvis was reviewed.  Actual films were reviewed.  Radiologist interpretation is of a  suspected nonocclusive thrombus within a distal branch of the inferior mesenteric vein with mild surrounding inflammatory  changes.  Extensive colonic diverticulosis is noted with no evidence of acute diverticulitis and no evidence of bowel edema  MEDICAL ISSUES: I discussed this at length with the patient.  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  Due to this probable incidental finding, I would not recommend systemic anticoagulation based on this.  She does have an extensive history of prior GI bleeds related to her diverticulosis and I feel that there is minimal benefit from anticoagulation with significant risk.  I discussed this with the patient who understands.  Will be further evaluated for viral or bacterial infectious syndrome with hospitalist.    Rosetta Posner, MD [facility] of [facility] Tel [phone] Pager [phone]

## 2019-05-24 NOTE — ED Notes (Signed)
Called 5W nurse unable to take report.

## 2019-05-24 NOTE — Discharge Instructions (Addendum)
As you received contrast today for your CAT scans please do not take your metformin for the next 3 days.   Salmonella Gastroenteritis, Adult Salmonella gastroenteritis is an infection of the intestines. It can cause nausea, vomiting, and other symptoms. Fever usually lasts for 2-3 days, and diarrhea lasts 4-10 days. Most people recover completely, but some people may develop lasting problems, such as arthritis, irritation of the eyes, or painful urination. What are the causes? This condition is caused by salmonella bacteria. These bacteria can spread through food that is not cooked properly, contact with animals that have the bacteria, or contact with a person's stool. You can get this infection by:  Eating food or drinking liquids that have the bacteria.  Drinking polluted standing water.  Coming into contact with an animal that is carrying the bacteria, such as a turtle, bird, snake, or iguana. What increases the risk? This condition is more likely to develop in:  Elderly adults.  People with a weakened disease-fighting system (immune system).  People with poor personal or kitchen hygiene.  People who have contact with animals that are known to carry the bacteria. What are the signs or symptoms? Symptoms of this condition include:  Diarrhea, which may be bloody.  Abdominal pain or cramps.  Fever.  Chills.  Nausea.  Vomiting.  Headache. How is this diagnosed? This condition may be diagnosed based on:  Your symptoms.  Your medical history.  A physical exam.  A blood test.  A stool test. How is this treated? This condition may be managed by:  Drinking plenty of fluids. This is important because this infection can make you lose a lot of fluid (dehydrated).  Taking antibiotic medicines. These may be given if your condition is severe. Medicines may help shorten your illness. Follow these instructions at home: Medicines  Take over-the-counter and prescription  medicines only as told by your health care provider.  Do not take medicines to help with diarrhea. These medicines can make the infection worse.  If you were prescribed an antibiotic medicine, take it as told by your health care provider. Do not stop taking the antibiotic even if you start to feel better. Eating and drinking      Drink enough fluid to keep your urine pale yellow. This helps prevent dehydration.  Drink only clear liquids until your diarrhea, nausea, or vomiting is under control. Clear liquids are liquids you can see through, such as water, broth, fruit juice with added water (diluted fruit juice), low-calorie sports drinks, or non-caffeinated tea.  Take an oral rehydration solution (ORS) as told by your health care provider. This drink is sold at pharmacies and retail stores.  If you are not hungry, do not force yourself to eat.  Eat bland, easy-to-digest foods in small amounts as you are able. These foods include bananas, applesauce, rice, lean meats, toast and crackers.  Avoid: ? Fluids that contain a lot of sugar or caffeine, such as energy drinks, high-calorie sports drinks, and soda. ? Alcohol. ? Foods that are greasy or contain a lot of fat or sugar. ? Spicy foods.  Do not prepare food for others if you have diarrhea. Food safety   Use separate food preparation surfaces and storage spaces for raw meat and for fruits and vegetables.  Keep refrigerated foods colder than 26F (5C).  Serve hot foods immediately or keep them heated above 126F (60C).  Always cook meat, eggs, seafood, and poultry thoroughly.  Do not eat or drink unpasteurized dairy products.  Wash your hands thoroughly after handling or preparing meat, eggs, seafood, and poultry.  Wash your hands, food preparation surfaces, and utensils thoroughly before and after you handle raw foods.  Wash your hands thoroughly before eating. General instructions  Wash your hands often with soap and  water. If soap and water are not available, use hand sanitizer. This helps keep the bacteria from spreading to others.  Keep track of changes in your weight. Losing a lot of weight can be a sign of a serious problem. Ask your health care provider how much weight loss should concern you.  Stay home from work or school as told by your health care provider.  Keep all follow-up visits as told by your health care provider. This is important. Contact a health care provider if you:  Have a fever.  Have diarrhea that has blood or mucus in it.  Feel weak or dizzy.  Have a headache.  Have urinated only a small amount of very dark urine over 6-8 hours.  Have weight loss.  Have redness, irritation, or pain in your eyes.  Have pain when urinating.  Have swelling or a feeling of warmth in a joint. Get help right away if you:  Cannot keep fluids down.  Cannot stop vomiting or having diarrhea.  Have pain in the abdomen, and the pain gets worse.  Have any symptoms of severe dehydration. These include: ? Not urinating in 6-8 hours. ? Cold and clammy skin. ? Extreme thirst. ? Confusion. ? Rapid breathing. ? Difficulty waking from sleep.  Have changes in vision or loss of vision. Summary  Salmonella gastroenteritis is an infection of the intestines. It can cause nausea, vomiting, and other symptoms. It is caused by salmonella bacteria.  People can get this condition by eating foods or drinking liquids that have the bacteria, or by coming into contact with an animal that is carrying the bacteria.  People with the condition need to drink plenty of fluids to avoid dehydration. Treatment for a severe case may include antibiotic medicines.  Follow your health care provider's instructions for taking medicines, eating and drinking, handling food in a safe way, and calling for help. This information is not intended to replace advice given to you by your health care provider. Make sure you  discuss any questions you have with your health care provider. Document Released: 10/30/2000 Document Revised: 12/13/2018 Document Reviewed: 12/16/2017 Elsevier Patient Education  2020 [employer].

## 2019-05-24 NOTE — ED Triage Notes (Signed)
Pt states since Sunday she has had a fever and body aches- pt had covid swab on Monday that is not back yet.

## 2019-05-24 NOTE — ED Provider Notes (Signed)
Transfer of Care Note  I assumed care of Carol Mueller on 05/24/2019 at around 1600 hours from [facility], [place].   Briefly, Carol Mueller is a 58 y.o. female who:  Presented to the ED today for evaluation of fevers, body aches and generally unwell feeling past 4 days.  She arrived febrile tachycardic, and with mild hypoxia on RA.   SIRS criteria met by fever, tachycardia, tachypnea and leukocytosis.  IV antibiotics have been started.  COVID-19 test result pending  The plan includes:  Follow-up CT abdomen and pelvis as well as CTA/PE study.   Please refer to the original provider's note for details of the patient's HPI & work-up prior to handoff to this physician.  I have reviewed the HPI, exam, labs and imaging results obtained prior to taking over patient care, and I have received a verbal handoff from the above provider.  Agree with history, physical exam and plan.   Reassessment: I personally reassessed the patient:  Vital Signs:  The most current vitals were  Vitals:   05/24/19 1500 05/24/19 1515  BP: 114/86 (!) 129/96  Pulse: (!) 105 (!) 106  Resp:    Temp:    SpO2: 91% 95%    Hemodynamics:  The patient is hemodynamically stable. Mental Status:  The patient is awake, alert and oriented x4.   Additional MDM: CTA-PE study resulted without evidence of acute pulmonary thromboembolism.  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  There is also evidence of extensive colonic diverticulosis without any evidence of acute diverticulitis.  I consulted the on-call Vascular Surgeon, Dr. Curt Jews, who will see the patient in the ED and offer recommendations. Dr. Donnetta Hutching has advised that the above finding of the nonocclusive IMV thrombus is unlikely the cause of her acute infectious symptoms and most likely incidental, and recommends Hospitalist  admission for further medical workup of her source of infection.   [facility] contacted for medicine admission, and patient accepted to their service. See admitting team's H&P for further.   The plan for this patient was discussed with my attending physician, Dr. Merrily Pew, who voiced agreement and who oversaw evaluation and treatment of this patient.   CLINICAL IMPRESSION: SIRS with antibiotic coverage for suspected intraabdominal source   DISPOSITION:  Admit  Angelena Sand A. Jimmye Norman, MD Resident Physician, PGY-3 [facility]    Jefm Petty, MD 05/25/19 [place]    Merrily Pew, MD 05/27/19 [id number]

## 2019-05-24 NOTE — ED Provider Notes (Cosign Needed)
[facility] EMERGENCY DEPARTMENT Provider Note   CSN: [id number] Arrival date & time: 05/24/19  1221    History   Chief Complaint Chief Complaint  Patient presents with  . Fever    HPI Carol Mueller is a 58 y.o. female with a past medical history of Cushing's syndrome status post unilateral adenectomy, insulin-dependent diabetes, PCOS, hypertension, hyperlipidemia, diverticulosis, ischemic colitis, who presents today for evaluation of fevers, body aches, and chills.  She reports that since Saturday she has had nausea and fevers along with muscle aches on Saturday and Sunday.  He denies any known coronavirus exposure.  She states that she has been isolated at home.  She reports occasional diarrhea.  Denies any blood in her bowel movements.  She has not been vomiting.  She denies any chest pain.  She does report that she feels slightly winded when she walks however does not feel short of breath or short of breath in general.  She denies any chest pain or abdominal pain.  She does report occasional headache.  She states that she still has her sense of taste and smell.  She is allergic to NSAIDs, therefore has been taking 1 g of Tylenol every 6 hours without reduction in her fever.      HPI  Past Medical History:  Diagnosis Date  . Anemia   . Cushing's syndrome ([place])    due to adrenal tumor  . Diabetes mellitus without complication (HCC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  . Diverticulitis   . Diverticulosis   . GI bleed   . Hyperlipidemia   . Hypertension   . Polycystic ovary disease     Patient Active Problem List   Diagnosis Date Noted  . Hypokalemia 06/02/2018  . Lower GI bleeding 06/02/2018  . Insulin-requiring or dependent type II diabetes mellitus ([place])   . Back pain with left-sided radiculopathy 09/29/2017  . Diarrhea 12/16/2016  . Lower GI bleed 11/30/2016  . History of GI diverticular bleed   . Diverticulosis of colon with hemorrhage 06/05/2016  . Ischemic  colitis ([place]) 08/07/2015  . Left sided abdominal pain   . Tachycardia 07/15/2015  . Polycystic disease, ovaries 09/12/2012  . Class 3 obesity due to excess calories with body mass index (BMI) of 40.0 to 44.9 in adult 09/12/2012  . Essential hypertension 09/12/2012  . Type II diabetes mellitus, uncontrolled ([place]) 09/12/2012  . Hyperlipidemia with target LDL less than 100 09/12/2012  . 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 ([place]) 09/12/2012  . Allergic rhinitis 04/06/2012    Past Surgical History:  Procedure Laterality Date  . ADRENALECTOMY  1996  . CHOLECYSTECTOMY    . ESOPHAGOGASTRODUODENOSCOPY (EGD) WITH PROPOFOL N/A 07/16/2015   Procedure: ESOPHAGOGASTRODUODENOSCOPY (EGD) WITH PROPOFOL;  Surgeon: [name], MD;  Location: [facility] ENDOSCOPY;  Service: Endoscopy;  Laterality: N/A;     OB History   No obstetric history on file.      Home Medications    Prior to Admission medications   Medication Sig Start Date End Date Taking? Authorizing Provider  atorvastatin (LIPITOR) 10 MG tablet Take 10 mg by mouth daily. 03/06/19  Yes [provider]  carvedilol (COREG) 3.125 MG tablet Take 3.125 mg by mouth 2 (two) times daily with a meal.   Yes [provider]  dicyclomine (BENTYL) 20 MG tablet TAKE 1 TABLET BY MOUTH TWICE A DAY Patient taking differently: Take 20 mg by mouth as needed (Stomach pain).  06/02/17  Yes [name], MD  hydrochlorothiazide (HYDRODIURIL) 25 MG tablet Take 25 mg by mouth daily. 04/14/19  Yes [provider]  Insulin Glargine (BASAGLAR KWIKPEN) 100 UNIT/ML SOPN Inject 30 Units into the skin daily. 05/15/19  Yes [provider]  JARDIANCE 10 MG TABS tablet Take 10 mg by mouth daily. 03/15/19  Yes [provider]  Lutein-Zeaxanthin 20-1 MG CAPS Take 1 capsule by mouth daily.   Yes [provider]  metFORMIN (GLUCOPHAGE) 1000 MG tablet Take 1 tablet (1,000 mg total) by mouth 2 (two) times daily with a meal.  PATIENT NEEDS OFFICE VISIT FOR ADDITIONAL REFILLS Patient taking differently: Take 1,000 mg by mouth 2 (two) times daily with a meal.  09/07/13  Yes Bailey Mech E, PA-C  Multiple Vitamins-Minerals (MULTIVITAMIN WITH MINERALS) tablet Take 1 tablet by mouth daily.   Yes [provider]  olmesartan (BENICAR) 40 MG tablet Take 40 mg by mouth daily. 04/13/19  Yes [provider]  Omega 3 1000 MG CAPS Take 1,000 mg by mouth daily.   Yes [provider]  Specialty Vitamins Products (ADVANCED COLLAGEN PO) Take 1 scoop by mouth once a week.    Yes [provider]  cyclobenzaprine (FLEXERIL) 10 MG tablet Take 0.5-1 tablets (5-10 mg total) by mouth 3 (three) times daily as needed for muscle spasms. Do not mix with narcotics. May cause drowsiness. Take in the daytime. Patient not taking: Reported on 05/24/2019 08/23/17   Tereasa Coop, PA-C  ferrous sulfate 325 (65 FE) MG tablet Take 1 tablet (325 mg total) by mouth daily with breakfast. Patient not taking: Reported on 06/02/2018 12/02/16   Murlean Iba, MD  HYDROcodone-acetaminophen (NORCO) 5-325 MG tablet Take 1 tablet by mouth every 6 (six) hours as needed for severe pain (May cause constipation). For severe pain only. Do not mix with alcohol, benzodiazepines, muscle relaxer. No refills without office visit. Take in the night time. Patient not taking: Reported on 06/02/2018 08/23/17   Tereasa Coop, PA-C  losartan-hydrochlorothiazide (HYZAAR) 100-25 MG per tablet TAKE 1 TABLET BY MOUTH DAILY. NEEDS OFFICE VISIT Patient not taking: Reported on 05/24/2019 03/20/13   Shawnee Knapp, MD  traMADol (ULTRAM) 50 MG tablet Take 1-2 tablets (50-100 mg total) by mouth every 8 (eight) hours as needed. Take in the daytime. Patient not taking: Reported on 06/02/2018 08/23/17   Tereasa Coop, PA-C    Family History Family History  Problem Relation Age of Onset  . Hypertension Mother   . Cancer Mother        ?uterine or ovarian  cancer  . Hypertension Father   . Diabetes Father   . Hypertension Sister   . Diabetes Sister   . Hypertension Brother   . Diabetes Brother   . Hypertension Maternal Grandfather   . Diabetes Paternal Grandmother   . Breast cancer Maternal Grandmother   . Colon cancer Neg Hx     Social History Social History   Tobacco Use  . Smoking status: Never Smoker  . Smokeless tobacco: Never Used  Substance Use Topics  . Alcohol use: No  . Drug use: No     Allergies   Norvasc [amlodipine besylate] and Nsaids   Review of Systems Review of Systems  Constitutional: Positive for chills, fatigue and fever.  Respiratory: Positive for cough (Since yesterday) and shortness of breath.   Gastrointestinal: Positive for diarrhea. Negative for abdominal pain, nausea and vomiting.  Genitourinary: Negative for dysuria and hematuria.  Musculoskeletal: Negative for back pain and neck  pain.  Skin: Negative for color change and wound.  Neurological: Positive for weakness. Negative for headaches.  Psychiatric/Behavioral: Negative for confusion. The patient is not nervous/anxious.   All other systems reviewed and are negative.    Physical Exam Updated Vital Signs BP (!) 129/96   Pulse (!) 106   Temp (!) 102.2 F (39 C) (Oral)   Resp 18   Ht 5' 3.5" (1.613 m)   Wt 112.9 kg   SpO2 95%   BMI 43.42 kg/m   Physical Exam Vitals signs and nursing note reviewed.  Constitutional:      General: She is not in acute distress.    Appearance: She is well-developed. She is obese.  HENT:     Head: Normocephalic and atraumatic.  Eyes:     Conjunctiva/sclera: Conjunctivae normal.  Neck:     Musculoskeletal: Normal range of motion and neck supple. No neck rigidity.  Cardiovascular:     Rate and Rhythm: Regular rhythm. Tachycardia present.     Pulses: Normal pulses.     Heart sounds: No murmur.  Pulmonary:     Effort: Pulmonary effort is normal. No respiratory distress.  Abdominal:      Palpations: Abdomen is soft.     Tenderness: There is abdominal tenderness (LLQ, only when palpated).  Musculoskeletal:     Right lower leg: No edema.     Left lower leg: No edema.  Skin:    General: Skin is warm and dry.  Neurological:     General: No focal deficit present.     Mental Status: She is alert and oriented to person, place, and time. Mental status is at baseline.  Psychiatric:        Mood and Affect: Mood normal.        Behavior: Behavior normal.      ED Treatments / Results  Labs (all labs ordered are listed, but only abnormal results are displayed) Labs Reviewed  COMPREHENSIVE METABOLIC PANEL - Abnormal; Notable for the following components:      Result Value   Sodium 132 (*)    Chloride 93 (*)    Glucose, Bld 175 (*)    Creatinine, Ser 1.14 (*)    Total Protein 8.2 (*)    AST 75 (*)    ALT 88 (*)    GFR calc non Af Amer 53 (*)    All other components within normal limits  CBC WITH DIFFERENTIAL/PLATELET - Abnormal; Notable for the following components:   WBC 13.1 (*)    RBC 6.23 (*)    MCV 69.5 (*)    MCH 22.0 (*)    RDW 16.6 (*)    Neutro Abs 9.1 (*)    Monocytes Absolute 1.7 (*)    All other components within normal limits  URINALYSIS, ROUTINE W REFLEX MICROSCOPIC - Abnormal; Notable for the following components:   APPearance HAZY (*)    Glucose, UA >=500 (*)    Ketones, ur 5 (*)    Bacteria, UA RARE (*)    All other components within normal limits  SARS CORONAVIRUS 2 (HOSPITAL ORDER, [facility])  CULTURE, BLOOD (ROUTINE X 2)  CULTURE, BLOOD (ROUTINE X 2)  URINE CULTURE  LACTIC ACID, PLASMA  LACTIC ACID, PLASMA  LIPASE, BLOOD    EKG EKG Interpretation  Date/Time:  Wednesday May 24 2019 13:12:22 EDT Ventricular Rate:  110 PR Interval:    QRS Duration: 86 QT Interval:  307 QTC Calculation: 416 R Axis:  36 Text Interpretation:  Sinus tachycardia No significant change since last tracing Confirmed by Merrily Pew [phone]) on 05/24/2019 3:45:19 PM   Radiology Dg Chest Port 1 View  Result Date: 05/24/2019 CLINICAL DATA:  Fever and body aches. EXAM: PORTABLE CHEST 1 VIEW COMPARISON:  None. FINDINGS: The heart size and mediastinal contours are within normal limits. Both lungs are clear. The visualized skeletal structures are unremarkable. IMPRESSION: No active cardiopulmonary disease. Electronically Signed   By: Abelardo Diesel M.D.   On: 05/24/2019 13:37    Procedures Procedures (including critical care time)  Medications Ordered in ED Medications  ceFEPIme (MAXIPIME) 2 g in sodium chloride 0.9 % 100 mL IVPB (2 g Intravenous New Bag/Given 05/24/19 1535)  metroNIDAZOLE (FLAGYL) IVPB 500 mg (500 mg Intravenous New Bag/Given 05/24/19 1541)  sodium chloride 0.9 % bolus 1,000 mL (has no administration in time range)  vancomycin (VANCOCIN) 2,000 mg in sodium chloride 0.9 % 500 mL IVPB (has no administration in time range)  ceFEPIme (MAXIPIME) 2 g in sodium chloride 0.9 % 100 mL IVPB (has no administration in time range)  vancomycin (VANCOCIN) 1,250 mg in sodium chloride 0.9 % 250 mL IVPB (has no administration in time range)     Initial Impression / Assessment and Plan / ED Course  I have reviewed the triage vital signs and the nursing notes.  Pertinent labs & imaging results that were available during my care of the patient were reviewed by me and considered in my medical decision making (see chart for details).  Clinical Course as of May 24 1547  Wed May 24, 2019  1509 SARS Coronavirus 2: NEGATIVE [EH]    Clinical Course User Index [EH] Lorin Glass, PA-C      Patient presents today for evaluation of fevers, body aches, and generally feeling unwell since 4 days ago.  Here she is febrile, has taken Tylenol in the past 6 hours.  She is also mildly tachycardic at 105 and borderline hypoxic at 91%.  She reports feeling winded with walking however denies specific chest pain.  Labs show that her  creatinine is increased 1.14 elevated from 0.73.  Her AST and ALT are usually borderline elevated, however today they are up to 75 and 88.    White count is elevated at 13.1, coronavirus test came back negative.  Given her leukocytosis, tachycardia, and fever she is started on broad-spectrum antibiotics.  Chest x-ray without evidence of consolidation.  Blood cultures were obtained.  Urine does not appear infected.  On exam she does have left lower quadrant abdominal tenderness to palpation.  Plan to obtain CT abdomen pelvis with contrast.  Based on her tachycardia and borderline hypoxia in the low 90s in the setting of a negative COVID test unable to apply PERC criteria.  Will obtain CT PE study.    At shift change care was transferred to Alroy Bailiff who will follow pending studies, re-evaulate and determine disposition.      Final Clinical Impressions(s) / ED Diagnoses   Final diagnoses:  Fever, unspecified fever cause  Tachycardia    ED Discharge Orders    None       Lorin Glass, [place] 05/24/19 1548

## 2019-05-24 NOTE — ED Notes (Addendum)
ED TO INPATIENT HANDOFF REPORT  ED Nurse Name and Phone #: William Hamburger RN [phone]  S Name/Age/Gender Altamease Oiler 58 y.o. female Room/Bed: 013C/013C  Code Status   Code Status: Prior  Home/SNF/Other Home Patient oriented to: situation Is this baseline? No   Triage Complete: Triage complete  Chief Complaint FEVER,NO TASTE  Triage Note Pt states since Sunday she has had a fever and body aches- pt had covid swab on Monday that is not back yet.    Allergies Allergies  Allergen Reactions  . Norvasc [Amlodipine Besylate] Swelling    ANGIOEDEMA  . Nsaids Other (See Comments)    GI upset/ GI bleed    Level of Care/Admitting Diagnosis ED Disposition    ED Disposition Condition Comment   Admit  The patient appears reasonably stabilized for admission considering the current resources, flow, and capabilities available in the ED at this time, and I doubt any other [facility] requiring further screening and/or treatment in the ED prior to admission is  present.       B Medical/Surgery History Past Medical History:  Diagnosis Date  . Anemia   . 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 Diabetes mellitus without complication (HCC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  . Diverticulitis   . Diverticulosis   . GI bleed   . Hyperlipidemia   . Hypertension   . Polycystic ovary disease    Past Surgical History:  Procedure Laterality Date  . ADRENALECTOMY  1996  . CHOLECYSTECTOMY    . ESOPHAGOGASTRODUODENOSCOPY (EGD) WITH PROPOFOL N/A 07/16/2015   Procedure: ESOPHAGOGASTRODUODENOSCOPY (EGD) WITH PROPOFOL;  Surgeon: [name], MD;  Location: [facility];  Service: Endoscopy;  Laterality: N/A;     A IV Location/Drains/Wounds Patient Lines/Drains/Airways Status   Active Line/Drains/Airways    Name:   Placement date:   Placement time:   Site:   Days:   Peripheral IV 05/24/19 Left Antecubital   05/24/19    1352    Antecubital   less than 1          Intake/Output Last 24  hours  Intake/Output Summary (Last 24 hours) at 05/24/2019 2025 Last data filed at 05/24/2019 1906 Gross per 24 hour  Intake 1700 ml  Output -  Net 1700 ml    Labs/Imaging Results for orders placed or performed during the hospital encounter of 05/24/19 (from the past 48 hour(s))  Comprehensive metabolic panel     Status: Abnormal   Collection Time: 05/24/19  1:27 PM  Result Value Ref Range   Sodium 132 (L) 135 - 145 mmol/L   Potassium 3.8 3.5 - 5.1 mmol/L   Chloride 93 (L) 98 - 111 mmol/L   CO2 24 22 - 32 mmol/L   Glucose, Bld 175 (H) 70 - 99 mg/dL   BUN 18 6 - 20 mg/dL   Creatinine, Ser 1.14 (H) 0.44 - 1.00 mg/dL   Calcium 9.5 8.9 - 10.3 mg/dL   Total Protein 8.2 (H) 6.5 - 8.1 g/dL   Albumin 3.7 3.5 - 5.0 g/dL   AST 75 (H) 15 - 41 U/L   ALT 88 (H) 0 - 44 U/L   Alkaline Phosphatase 66 38 - 126 U/L   Total Bilirubin 0.8 0.3 - 1.2 mg/dL   GFR calc non Af Amer 53 (L) >60 mL/min   GFR calc Af Amer >60 >60 mL/min   Anion gap 15 5 - 15    Comment: Performed at [facility], [address] [place], [place] [address]  CBC with Differential     Status: Abnormal   Collection Time: 05/24/19  1:27 PM  Result Value Ref Range   WBC 13.1 (H) 4.0 - 10.5 K/uL   RBC 6.23 (H) 3.87 - 5.11 MIL/uL   Hemoglobin 13.7 12.0 - 15.0 g/dL   HCT 43.3 36.0 - 46.0 %   MCV 69.5 (L) 80.0 - 100.0 fL   MCH 22.0 (L) 26.0 - 34.0 pg   MCHC 31.6 30.0 - 36.0 g/dL   RDW 16.6 (H) 11.5 - 15.5 %   Platelets 263 150 - 400 K/uL    Comment: REPEATED TO VERIFY   nRBC 0.0 0.0 - 0.2 %   Neutrophils Relative % 69 %   Neutro Abs 9.1 (H) 1.7 - 7.7 K/uL   Lymphocytes Relative 16 %   Lymphs Abs 2.1 0.7 - 4.0 K/uL   Monocytes Relative 13 %   Monocytes Absolute 1.7 (H) 0.1 - 1.0 K/uL   Eosinophils Relative 1 %   Eosinophils Absolute 0.1 0.0 - 0.5 K/uL   Basophils Relative 1 %   Basophils Absolute 0.1 0.0 - 0.1 K/uL   Immature Granulocytes 0 %   Abs Immature Granulocytes 0.05 0.00 - 0.07 K/uL    Comment:  Performed at [facility], [place] [address] [place], [place] [address]  Lactic acid, plasma     Status: None   Collection Time: 05/24/19  1:27 PM  Result Value Ref Range   Lactic Acid, Venous 1.8 0.5 - 1.9 mmol/L    Comment: Performed at [facility] [address] [place],  [address]  SARS Coronavirus 2 (CEPHEID- Performed in [place] hospital lab), Hosp Order     Status: None   Collection Time: 05/24/19  1:27 PM   Specimen: Nasopharyngeal Swab  Result Value Ref Range   SARS Coronavirus 2 NEGATIVE NEGATIVE    Comment: (NOTE) If result is NEGATIVE SARS-CoV-2 target nucleic acids are NOT DETECTED. The SARS-CoV-2 RNA is generally detectable in upper and lower  respiratory specimens during the acute phase of infection. The lowest  concentration of SARS-CoV-2 viral copies this assay can detect is 250  copies / mL. A negative result does not preclude SARS-CoV-2 infection  and should not be used as the sole basis for treatment or other  patient management decisions.  A negative result may occur with  improper specimen collection / handling, submission of specimen other  than nasopharyngeal swab, presence of viral mutation(s) within the  areas targeted by this assay, and inadequate number of viral copies  (<250 copies / mL). A negative result must be combined with clinical  observations, patient history, and epidemiological information. If result is POSITIVE SARS-CoV-2 target nucleic acids are DETECTED. The SARS-CoV-2 RNA is generally detectable in upper and lower  respiratory specimens dur ing the acute phase of infection.  Positive  results are indicative of active infection with SARS-CoV-2.  Clinical  correlation with patient history and other diagnostic information is  necessary to determine patient infection status.  Positive results do  not rule out bacterial infection or co-infection with other viruses. If result is PRESUMPTIVE POSTIVE SARS-CoV-2 nucleic  acids MAY BE PRESENT.   A presumptive positive result was obtained on the submitted specimen  and confirmed on repeat testing.  While 2019 novel coronavirus  (SARS-CoV-2) nucleic acids may be present in the submitted sample  additional confirmatory testing may be necessary for epidemiological  and / or clinical  management purposes  to differentiate between  SARS-CoV-2 and other Sarbecovirus currently known to infect humans.  If clinically indicated additional testing with an alternate test  methodology [phone]) is advised. The SARS-CoV-2 RNA is generally  detectable in upper and lower respiratory sp ecimens during the acute  phase of infection. The expected result is Negative. Fact Sheet for Patients:  StrictlyIdeas.no Fact Sheet for Healthcare Providers: BankingDealers.co.za This test is not yet approved or cleared by the [place] FDA and has been authorized for detection and/or diagnosis of SARS-CoV-2 by FDA under an Emergency Use Authorization (EUA).  This EUA will remain in effect (meaning this test can be used) for the duration of the COVID-19 declaration under Section 564(b)(1) of the Act, 21 U.S.C. section 360bbb-3(b)(1), unless the authorization is terminated or revoked sooner. Performed at [facility], [place] [address], [place], [place] [address]   Lipase, blood     Status: None   Collection Time: 05/24/19  1:27 PM  Result Value Ref Range   Lipase 40 11 - 51 U/L    Comment: Performed at [facility] [address] [place],  [address]  Urinalysis, Routine w reflex microscopic     Status: Abnormal   Collection Time: 05/24/19  2:30 PM  Result Value Ref Range   Color, Urine YELLOW YELLOW   APPearance HAZY (A) CLEAR   Specific Gravity, Urine 1.020 1.005 - 1.030   pH 5.0 5.0 - 8.0   Glucose, UA >=500 (A) NEGATIVE mg/dL   Hgb urine dipstick NEGATIVE NEGATIVE   Bilirubin Urine NEGATIVE NEGATIVE    Ketones, ur 5 (A) NEGATIVE mg/dL   Protein, ur NEGATIVE NEGATIVE mg/dL   Nitrite NEGATIVE NEGATIVE   Leukocytes,Ua NEGATIVE NEGATIVE   RBC / HPF 0-5 0 - 5 RBC/hpf   WBC, UA 0-5 0 - 5 WBC/hpf   Bacteria, UA RARE (A) NONE SEEN   Squamous Epithelial / LPF 0-5 0 - 5   Mucus PRESENT     Comment: Performed at [facility], [place] [address], [place], [place] [address]   Ct Angio Chest Pe W And/or Wo Contrast  Result Date: 05/24/2019 CLINICAL DATA:  Fever and body aches since Sunday. EXAM: CT ANGIOGRAPHY CHEST CT ABDOMEN AND PELVIS WITH CONTRAST TECHNIQUE: Multidetector CT imaging of the chest was performed using the standard protocol during bolus administration of intravenous contrast. Multiplanar CT image reconstructions and MIPs were obtained to evaluate the vascular anatomy. Multidetector CT imaging of the abdomen and pelvis was performed using the standard protocol during bolus administration of intravenous contrast. CONTRAST:  170mL OMNIPAQUE IOHEXOL 350 MG/ML SOLN COMPARISON:  Chest x-ray from same day. CT abdomen pelvis dated June 02, 2018. FINDINGS: CTA CHEST FINDINGS Cardiovascular: Satisfactory opacification of the pulmonary arteries to the segmental level. No evidence of pulmonary embolism. Normal heart size. No pericardial effusion. No thoracic aortic aneurysm or dissection. Mediastinum/Nodes: No enlarged mediastinal, hilar, or axillary lymph nodes. Thyroid gland, trachea, and esophagus demonstrate no significant findings. Lungs/Pleura: No focal consolidation, pleural effusion, or pneumothorax. Two 4 mm nodules in the right upper lobe (series 11, images 26 and 33). Musculoskeletal: No chest wall abnormality. No acute or significant osseous findings. Review of the MIP images confirms the above findings. CT ABDOMEN AND PELVIS FINDINGS Hepatobiliary: Unchanged diffuse hepatic steatosis. No focal liver abnormality. Prior cholecystectomy. No biliary dilatation. Pancreas: Unremarkable. No pancreatic  ductal dilatation or surrounding inflammatory changes. Spleen: Normal in size without focal abnormality. Adrenals/Urinary Tract: Prior right adrenalectomy.  The left adrenal gland is unremarkable. Unchanged bilateral renal simple cysts. No renal calculi or hydronephrosis. The bladder is unremarkable. Stomach/Bowel: Stomach is within normal limits. Appendix appears normal. No evidence of bowel wall thickening, distention, or inflammatory changes. Extensive colonic diverticulosis again noted. 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 No enlarged abdominal or pelvic lymph nodes. Reproductive: Multiple calcified uterine fibroids again noted. No adnexal mass. Other: Unchanged tiny fat containing right inguinal and umbilical hernias. No free fluid or pneumoperitoneum. Musculoskeletal: No acute or significant osseous findings. Review of the MIP images confirms the above findings. IMPRESSION: Chest: 1. No evidence of pulmonary embolism. No acute intrathoracic process. 2. Two 4 mm pulmonary nodules in the right upper lobe. No follow-up needed if patient is low-risk (and has no known or suspected primary neoplasm). Non-contrast chest CT can be considered in 12 months if patient is high-risk. This recommendation follows the consensus statement: Guidelines for Management of Incidental Pulmonary Nodules Detected on CT Images: From the Fleischner Society 2017; Radiology 2017; 284:228-243. Abdomen and pelvis: 1. 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 2. Extensive colonic diverticulosis again noted. No evidence of acute diverticulitis. 3. Unchanged hepatic steatosis. 4. Unchanged enlarged fibroid uterus. Electronically Signed   By: Titus Dubin M.D.   On: 05/24/2019 16:47   Ct  Abdomen Pelvis W Contrast  Result Date: 05/24/2019 CLINICAL DATA:  Fever and body aches since Sunday. EXAM: CT ANGIOGRAPHY CHEST CT ABDOMEN AND PELVIS WITH CONTRAST TECHNIQUE: Multidetector CT imaging of the chest was performed using the standard protocol during bolus administration of intravenous contrast. Multiplanar CT image reconstructions and MIPs were obtained to evaluate the vascular anatomy. Multidetector CT imaging of the abdomen and pelvis was performed using the standard protocol during bolus administration of intravenous contrast. CONTRAST:  128mL OMNIPAQUE IOHEXOL 350 MG/ML SOLN COMPARISON:  Chest x-ray from same day. CT abdomen pelvis dated June 02, 2018. FINDINGS: CTA CHEST FINDINGS Cardiovascular: Satisfactory opacification of the pulmonary arteries to the segmental level. No evidence of pulmonary embolism. Normal heart size. No pericardial effusion. No thoracic aortic aneurysm or dissection. Mediastinum/Nodes: No enlarged mediastinal, hilar, or axillary lymph nodes. Thyroid gland, trachea, and esophagus demonstrate no significant findings. Lungs/Pleura: No focal consolidation, pleural effusion, or pneumothorax. Two 4 mm nodules in the right upper lobe (series 11, images 26 and 33). Musculoskeletal: No chest wall abnormality. No acute or significant osseous findings. Review of the MIP images confirms the above findings. CT ABDOMEN AND PELVIS FINDINGS Hepatobiliary: Unchanged diffuse hepatic steatosis. No focal liver abnormality. Prior cholecystectomy. No biliary dilatation. Pancreas: Unremarkable. No pancreatic ductal dilatation or surrounding inflammatory changes. Spleen: Normal in size without focal abnormality. Adrenals/Urinary Tract: Prior right adrenalectomy. The left adrenal gland is unremarkable. Unchanged bilateral renal simple cysts. No renal calculi or hydronephrosis. The bladder is unremarkable. Stomach/Bowel: Stomach is within normal limits. Appendix appears normal. No evidence of bowel  wall thickening, distention, or inflammatory changes. Extensive colonic diverticulosis again noted. 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 No enlarged abdominal or pelvic lymph nodes. Reproductive: Multiple calcified uterine fibroids again noted. No adnexal mass. Other: Unchanged tiny fat containing right inguinal and umbilical hernias. No free fluid or pneumoperitoneum. Musculoskeletal: No acute or significant osseous findings. Review of  the MIP images confirms the above findings. IMPRESSION: Chest: 1. No evidence of pulmonary embolism. No acute intrathoracic process. 2. Two 4 mm pulmonary nodules in the right upper lobe. No follow-up needed if patient is low-risk (and has no known or suspected primary neoplasm). Non-contrast chest CT can be considered in 12 months if patient is high-risk. This recommendation follows the consensus statement: Guidelines for Management of Incidental Pulmonary Nodules Detected on CT Images: From the Fleischner Society 2017; Radiology 2017; 284:228-243. Abdomen and pelvis: 1. 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 2. Extensive colonic diverticulosis again noted. No evidence of acute diverticulitis. 3. Unchanged hepatic steatosis. 4. Unchanged enlarged fibroid uterus. Electronically Signed   By: Titus Dubin M.D.   On: 05/24/2019 16:47   Dg Chest Port 1 View  Result Date: 05/24/2019 CLINICAL DATA:  Fever and body aches. EXAM: PORTABLE CHEST 1 VIEW COMPARISON:  None. FINDINGS: The heart size and mediastinal contours are within normal limits. Both lungs are clear. The visualized skeletal structures are unremarkable. IMPRESSION: No active cardiopulmonary disease. Electronically Signed   By: Abelardo Diesel M.D.   On: 05/24/2019 13:37     Pending Labs Unresulted Labs (From admission, onward)    Start     Ordered   05/24/19 1938  Hemoglobin A1c  Once,   STAT    Comments: To assess prior glycemic control    05/24/19 1937   05/24/19 1921  Lactic acid, plasma  STAT Now then every 3 hours,   R     05/24/19 1920   05/24/19 1519  Urine culture  ONCE - STAT,   STAT     05/24/19 1518   05/24/19 1304  Culture, blood (routine x 2)  BLOOD CULTURE X 2,   STAT     05/24/19 1305   05/24/19 1304  Lactic acid, plasma  Now then every 2 hours,   STAT     05/24/19 1305          Vitals/Pain Today's Vitals   05/24/19 1515 05/24/19 1530 05/24/19 1745 05/24/19 1842  BP: (!) 129/96 125/81  119/64  Pulse: (!) 106 (!) 103 (!) 106 (!) 112  Resp:   (!) 27 17  Temp:    99.5 F (37.5 C)  TempSrc:    Oral  SpO2: 95% 94% 95% 96%  Weight: 112.9 kg     Height: 5' 3.5" (1.613 m)       Isolation Precautions No active isolations  Medications Medications  metroNIDAZOLE (FLAGYL) IVPB 500 mg (has no administration in time range)  insulin aspart (novoLOG) injection 0-9 Units (has no administration in time range)  morphine 2 MG/ML injection 2-4 mg (has no administration in time range)  cefTRIAXone (ROCEPHIN) 2 g in sodium chloride 0.9 % 100 mL IVPB (has no administration in time range)  insulin glargine (LANTUS) injection 15 Units (has no administration in time range)  atorvastatin (LIPITOR) tablet 10 mg (has no administration in time range)  ceFEPIme (MAXIPIME) 2 g in sodium chloride 0.9 % 100 mL IVPB (0 g Intravenous Stopped 05/24/19 1704)  metroNIDAZOLE (FLAGYL) IVPB 500 mg (0 mg Intravenous Stopped 05/24/19 1704)  sodium chloride 0.9 % bolus 1,000 mL (0 mLs Intravenous Stopped 05/24/19 1846)  vancomycin (VANCOCIN) 2,000 mg in sodium chloride 0.9 % 500 mL IVPB (0 mg Intravenous Stopped 05/24/19 1906)  iohexol (OMNIPAQUE) 350 MG/ML injection 100 mL (100 mLs Intravenous Contrast Given 05/24/19 1607)  acetaminophen (TYLENOL) tablet  650 mg (650 mg  Oral Given 05/24/19 1853)  0.9 %  sodium chloride infusion ( Intravenous Bolus from Bag 05/24/19 1853)  morphine 4 MG/ML injection 4 mg (4 mg Intravenous Given 05/24/19 1912)  ondansetron (ZOFRAN) injection 4 mg (4 mg Intravenous Given 05/24/19 1912)    Mobility Walks Low fall risk   Focused Assessments Pulmonary Assessment Handoff:  Lung sounds:   O2 Device: Nasal Cannula, Room Air        R Recommendations: See Admitting Provider Note  Report given to:   Additional Notes:  Covid negative; IV ABX given; please call if you have questions.

## 2019-05-24 NOTE — Progress Notes (Signed)
Pharmacy Antibiotic Note  Carol Mueller is a 58 y.o. female admitted on 05/24/2019 with c/o fever and body aches since Sunday.  Pharmacy has been consulted for cefepime and vancomycin dosing for infection of unknown source. Covid negative. Tmax 102.2 and WBC 13.1  Vancomycin 1.25g IV Q 24 hrs. Goal AUC 400-550. Expected AUC: 525.4 SCr used: 1.14   Plan: Start vancomycin 2g IV x1 then vancomycin 1.25g IV q24h Start cefepime 2g IV q8h Monitor renal function and clinical progression F/u C&S     Temp (24hrs), Avg:102.2 F (39 C), Min:102.2 F (39 C), Max:102.2 F (39 C)  Recent Labs  Lab 05/24/19 1327  WBC 13.1*  CREATININE 1.14*  LATICACIDVEN 1.8    CrCl cannot be calculated (Unknown ideal weight.).    Allergies  Allergen Reactions  . Norvasc [Amlodipine Besylate] Swelling    ANGIOEDEMA  . Nsaids Other (See Comments)    GI upset/ GI bleed    Antimicrobials this admission: Cefepime 7/8 >>  Vancomycin 7/8 >> Metronidazole 7/8 x1   Dose adjustments this admission: n/a  Microbiology results: Covid negative  Thank you for allowing pharmacy to be a part of this patient's care.  Natale Lay 05/24/2019 3:27 PM

## 2019-05-24 NOTE — ED Notes (Signed)
Urine culture attach to sample

## 2019-05-25 ENCOUNTER — Encounter (HOSPITAL_COMMUNITY): Payer: Self-pay | Admitting: General Practice

## 2019-05-25 LAB — COMPREHENSIVE METABOLIC PANEL
ALT: 71 U/L — ABNORMAL HIGH (ref 0–44)
AST: 54 U/L — ABNORMAL HIGH (ref 15–41)
Albumin: 3.1 g/dL — ABNORMAL LOW (ref 3.5–5.0)
Alkaline Phosphatase: 59 U/L (ref 38–126)
Anion gap: 12 (ref 5–15)
BUN: 12 mg/dL (ref 6–20)
CO2: 23 mmol/L (ref 22–32)
Calcium: 8.3 mg/dL — ABNORMAL LOW (ref 8.9–10.3)
Chloride: 100 mmol/L (ref 98–111)
Creatinine, Ser: 1.03 mg/dL — ABNORMAL HIGH (ref 0.44–1.00)
GFR calc Af Amer: >60 mL/min (ref >60)
GFR calc non Af Amer: 60 mL/min — ABNORMAL LOW (ref >60)
Glucose, Bld: 105 mg/dL — ABNORMAL HIGH (ref 70–99)
Potassium: 3.5 mmol/L (ref 3.5–5.1)
Sodium: 135 mmol/L (ref 135–145)
Total Bilirubin: 0.8 mg/dL (ref 0.3–1.2)
Total Protein: 6.9 g/dL (ref 6.5–8.1)

## 2019-05-25 LAB — MAGNESIUM: Magnesium: 1.8 mg/dL (ref 1.7–2.4)

## 2019-05-25 LAB — CBC
HCT: 38.8 % (ref 36.0–46.0)
Hemoglobin: 12.3 g/dL (ref 12.0–15.0)
MCH: 22.1 pg — ABNORMAL LOW (ref 26.0–34.0)
MCHC: 31.7 g/dL (ref 30.0–36.0)
MCV: 69.8 fL — ABNORMAL LOW (ref 80.0–100.0)
Platelets: 256 10*3/uL (ref 150–400)
RBC: 5.56 MIL/uL — ABNORMAL HIGH (ref 3.87–5.11)
RDW: 16 % — ABNORMAL HIGH (ref 11.5–15.5)
WBC: 12.4 10*3/uL — ABNORMAL HIGH (ref 4.0–10.5)
nRBC: 0 % (ref 0.0–0.2)

## 2019-05-25 LAB — URINE CULTURE: Culture: NO GROWTH

## 2019-05-25 LAB — PROCALCITONIN: Procalcitonin: 0.38 ng/mL

## 2019-05-25 LAB — GLUCOSE, CAPILLARY
Glucose-Capillary: 104 mg/dL — ABNORMAL HIGH (ref 70–99)
Glucose-Capillary: 113 mg/dL — ABNORMAL HIGH (ref 70–99)
Glucose-Capillary: 120 mg/dL — ABNORMAL HIGH (ref 70–99)
Glucose-Capillary: 148 mg/dL — ABNORMAL HIGH (ref 70–99)
Glucose-Capillary: 159 mg/dL — ABNORMAL HIGH (ref 70–99)
Glucose-Capillary: 217 mg/dL — ABNORMAL HIGH (ref 70–99)
Glucose-Capillary: 95 mg/dL (ref 70–99)

## 2019-05-25 LAB — LACTIC ACID, PLASMA
Lactic Acid, Venous: 1 mmol/L (ref 0.5–1.9)
Lactic Acid, Venous: 0.8 mmol/L (ref 0.5–1.9)

## 2019-05-25 MED ORDER — IRBESARTAN 300 MG PO TABS
300.0000 mg | ORAL_TABLET | Freq: Every day | ORAL | Status: DC
  Administered 2019-05-25 – 2019-05-29 (×5): 300 mg via ORAL
  Filled 2019-05-25 (×5): qty 1

## 2019-05-25 MED ORDER — INSULIN ASPART 100 UNIT/ML ~~LOC~~ SOLN: 0.0000 [IU] | Freq: Every day | SUBCUTANEOUS | Status: DC

## 2019-05-25 MED ORDER — INSULIN ASPART 100 UNIT/ML ~~LOC~~ SOLN
0.0000 [IU] | Freq: Three times a day (TID) | SUBCUTANEOUS | Status: DC
  Administered 2019-05-26: 2 [IU] via SUBCUTANEOUS
  Administered 2019-05-26 (×2): 3 [IU] via SUBCUTANEOUS
  Administered 2019-05-27: 2 [IU] via SUBCUTANEOUS
  Administered 2019-05-27: 5 [IU] via SUBCUTANEOUS
  Administered 2019-05-27 – 2019-05-28 (×4): 3 [IU] via SUBCUTANEOUS
  Administered 2019-05-29: 08:00:00 2 [IU] via SUBCUTANEOUS
  Administered 2019-05-29: 3 [IU] via SUBCUTANEOUS

## 2019-05-25 MED ORDER — ENOXAPARIN SODIUM 40 MG/0.4ML ~~LOC~~ SOLN: 40.0000 mg | SUBCUTANEOUS | Status: DC

## 2019-05-25 MED ORDER — CARVEDILOL 3.125 MG PO TABS
3.1250 mg | ORAL_TABLET | Freq: Two times a day (BID) | ORAL | Status: DC
  Administered 2019-05-25 – 2019-05-29 (×8): 3.125 mg via ORAL
  Filled 2019-05-25 (×8): qty 1

## 2019-05-25 MED ORDER — PROMETHAZINE HCL 25 MG/ML IJ SOLN
12.5000 mg | Freq: Once | INTRAMUSCULAR | Status: DC
  Filled 2019-05-25: qty 1

## 2019-05-25 NOTE — Progress Notes (Addendum)
Carol Mueller is a 58 y.o. female patient admitted from ED awake, alert - oriented  X 4 - no acute distress noted.  VSS - Blood pressure 123/81, pulse 99, temperature 98.5 F (36.9 C), temperature source Oral, resp. rate 16, height 5' 3.5" (1.613 m), weight 112.9 kg, SpO2 97 %.    IV in place, occlusive dsg intact without redness.  Orientation to room, and floor completed with information packet given to patient/family.  Patient declined safety video at this time.  Admission INP armband ID verified with patient/family, and in place.    SR up x 2, fall assessment complete, with patient and family able to verbalize understanding of risk associated with falls, and verbalized understanding to call nsg before up out of bed.    Call light within reach, patient able to voice, and demonstrate understanding.  Skin to skin check completed with second RN; skin integrity documented. Skin, clean-dry- intact without evidence of bruising, or skin tears. No evidence of skin break down noted on exam.     Will cont to eval and treat per MD orders.  Howard Pouch, RN 05/25/2019 2:08 AM

## 2019-05-25 NOTE — Progress Notes (Signed)
Spoke to patient during first shift about calling family if needed. She said not necessary, she could explain herself.

## 2019-05-25 NOTE — Progress Notes (Signed)
Patient ID: Carol Mueller, female   DOB: [birth year], 58 y.o.   MRN: [id number] Feels better today but still with some lower abdominal discomfort. Afebrile currently on antibiotics. 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  Would not recommend any further evaluation or treatment unless she had suggestion of progression of mesenteric venous thrombosis.  Do not feel there is indication for anticoagulation with her significant history of severe diverticulosis and multiple GI bleeds.  Will not follow actively.  Please call if we can assist

## 2019-05-25 NOTE — Progress Notes (Signed)
Patient inquired about alternative to Heparin SQ such as SCD's. Will inform day RN to follow up with care team.  Notified by tele at [phone] that patient had PVC's and bigeminy approx every other beat. RN at patient's bedside when received call. Patient complaining of nausea and abdominal pain, however, no distress. Declined Morphine for pain as it made her feel abnormal when she received it in ED.   Paged NP Schorr at [phone] and informed of pt.having bigeminy & PVCs every other beat. One time phenergan ordered, however, PRN Zofran given for nausea.

## 2019-05-25 NOTE — Progress Notes (Signed)
PROGRESS NOTE    Carol Mueller  JSH:[id number] DOB: 02-29-1961 DOA: 05/24/2019 PCP: Shawnee Knapp, MD   Brief Narrative:  Carol Mueller is a 58 year old female with a past medical history of unilateral adrenalectomy for Cushing's syndrome, type 2 diabetes mellitus on insulin, hypertension, hyperlipidemia and diverticulosis who presented to ED on 05/24/2019 with complaint of fever, chills body aches nausea for past 4 days.  No shortness of breath, cough or rash or dysuria or any urinary complaints.  No bowel complaints either.  Per patient, she was having high-grade fever at home up to 103.  Upon arrival to the emergency department, she was febrile with 102.2.  Slightly tachypneic with 28 of respiratory rate and heart rate of 102.  She was also having leukocytosis.  She was given IV cefepime, Flagyl and vancomycin in the ED and was admitted under hospitalist service with working diagnosis of sepsis of unknown source.  She was COVID-19 negative.  She also had CT angiogram of the chest and was ruled out of PE however she was found to have incidental finding of thrombus in the distal inferior mesenteric vein for which vascular surgery was consulted through the emergency department.  Per vascular surgery recommendation, patient does not need any systemic anticoagulation for this.  Consultants:   Vascular surgery  Procedures:   None  Antimicrobials:   IV Rocephin and Flagyl started 05/24/2019.   Subjective: Patient seen and examined.  She complains of abdominal pain since last night from the emergency department.  She does not have any body aches anymore.  No nausea or vomiting.  She tells me that she has ongoing issues with alternating diarrhea and vomiting so that is not new either.  She has remained afebrile since admission.  She has no sick contact as she has been self isolating since the start of COVID pandemic.  Objective: Vitals:   05/24/19 2233 05/24/19 2256 05/25/19 0044 05/25/19 0609    BP: 112/63 123/81  115/73  Pulse: 99 99  (!) 49  Resp: 18 16  16   Temp: 98.7 F (37.1 C) 99.8 F (37.7 C) 98.5 F (36.9 C) 99.7 F (37.6 C)  TempSrc: Oral Oral Oral Oral  SpO2: 97% 97%  95%  Weight:    117.9 kg  Height:    5\' 3"  (1.6 m)    Intake/Output Summary (Last 24 hours) at 05/25/2019 1030 Last data filed at 05/25/2019 0700 Gross per 24 hour  Intake 1800 ml  Output 1500 ml  Net 300 ml   Filed Weights   05/24/19 1515 05/25/19 0609  Weight: 112.9 kg 117.9 kg    Examination:  General exam: Appears calm and comfortable, obese Respiratory system: Clear to auscultation. Respiratory effort normal. Cardiovascular system: S1 & S2 heard, RRR. No JVD, murmurs, rubs, gallops or clicks. No pedal edema. Gastrointestinal system: Abdomen is nondistended, soft but tender in left upper quadrant and left middle area. No organomegaly or masses felt. Normal bowel sounds heard. Central nervous system: Alert and oriented. No focal neurological deficits. Extremities: Symmetric 5 x 5 power. Skin: No rashes, lesions or ulcers Psychiatry: Judgement and insight appear normal. Mood & affect appropriate.    Data Reviewed: I have personally reviewed following labs and imaging studies  CBC: Recent Labs  Lab 05/24/19 1327 05/25/19 0244  WBC 13.1* 12.4*  NEUTROABS 9.1*  --   HGB 13.7 12.3  HCT 43.3 38.8  MCV 69.5* 69.8*  PLT 263 850   Basic Metabolic Panel: Recent Labs  Lab 05/24/19 1327 05/25/19 0244 05/25/19 0617  NA 132* 135  --   K 3.8 3.5  --   CL 93* 100  --   CO2 24 23  --   GLUCOSE 175* 105*  --   BUN 18 12  --   CREATININE 1.14* 1.03*  --   CALCIUM 9.5 8.3*  --   MG  --   --  1.8   GFR: Estimated Creatinine Clearance: 73.9 mL/min (A) (by C-G formula based on SCr of 1.03 mg/dL (H)). Liver Function Tests: Recent Labs  Lab 05/24/19 1327 05/25/19 0244  AST 75* 54*  ALT 88* 71*  ALKPHOS 66 59  BILITOT 0.8 0.8  PROT 8.2* 6.9  ALBUMIN 3.7 3.1*   Recent Labs  Lab  05/24/19 1327  LIPASE 40   No results for input(s): AMMONIA in the last 168 hours. Coagulation Profile: No results for input(s): INR, PROTIME in the last 168 hours. Cardiac Enzymes: No results for input(s): CKTOTAL, CKMB, CKMBINDEX, TROPONINI in the last 168 hours. BNP (last 3 results) No results for input(s): PROBNP in the last 8760 hours. HbA1C: No results for input(s): HGBA1C in the last 72 hours. CBG: Recent Labs  Lab 05/24/19 2035 05/25/19 0035 05/25/19 0528 05/25/19 0752  GLUCAP 109* 113* 104* 95   Lipid Profile: No results for input(s): CHOL, HDL, LDLCALC, TRIG, CHOLHDL, LDLDIRECT in the last 72 hours. Thyroid Function Tests: No results for input(s): TSH, T4TOTAL, FREET4, T3FREE, THYROIDAB in the last 72 hours. Anemia Panel: No results for input(s): VITAMINB12, FOLATE, FERRITIN, TIBC, IRON, RETICCTPCT in the last 72 hours. Sepsis Labs: Recent Labs  Lab 05/24/19 1327 05/24/19 2304 05/25/19 0244 05/25/19 0617  PROCALCITON 0.47  --  0.38  --   LATICACIDVEN 1.8 1.1 1.0 0.8    Recent Results (from the past 240 hour(s))  SARS Coronavirus 2 (CEPHEID- Performed in [place] hospital lab), Hosp Order     Status: None   Collection Time: 05/24/19  1:27 PM   Specimen: Nasopharyngeal Swab  Result Value Ref Range Status   SARS Coronavirus 2 NEGATIVE NEGATIVE Final    Comment: (NOTE) If result is NEGATIVE SARS-CoV-2 target nucleic acids are NOT DETECTED. The SARS-CoV-2 RNA is generally detectable in upper and lower  respiratory specimens during the acute phase of infection. The lowest  concentration of SARS-CoV-2 viral copies this assay can detect is 250  copies / mL. A negative result does not preclude SARS-CoV-2 infection  and should not be used as the sole basis for treatment or other  patient management decisions.  A negative result may occur with  improper specimen collection / handling, submission of specimen other  than nasopharyngeal swab, presence of viral  mutation(s) within the  areas targeted by this assay, and inadequate number of viral copies  (<250 copies / mL). A negative result must be combined with clinical  observations, patient history, and epidemiological information. If result is POSITIVE SARS-CoV-2 target nucleic acids are DETECTED. The SARS-CoV-2 RNA is generally detectable in upper and lower  respiratory specimens dur ing the acute phase of infection.  Positive  results are indicative of active infection with SARS-CoV-2.  Clinical  correlation with patient history and other diagnostic information is  necessary to determine patient infection status.  Positive results do  not rule out bacterial infection or co-infection with other viruses. If result is PRESUMPTIVE POSTIVE SARS-CoV-2 nucleic acids MAY BE PRESENT.   A presumptive positive result was obtained on the submitted specimen  and  confirmed on repeat testing.  While 2019 novel coronavirus  (SARS-CoV-2) nucleic acids may be present in the submitted sample  additional confirmatory testing may be necessary for epidemiological  and / or clinical management purposes  to differentiate between  SARS-CoV-2 and other Sarbecovirus currently known to infect humans.  If clinically indicated additional testing with an alternate test  methodology [phone]) is advised. The SARS-CoV-2 RNA is generally  detectable in upper and lower respiratory sp ecimens during the acute  phase of infection. The expected result is Negative. Fact Sheet for Patients:  StrictlyIdeas.no Fact Sheet for Healthcare Providers: BankingDealers.co.za This test is not yet approved or cleared by the [place] FDA and has been authorized for detection and/or diagnosis of SARS-CoV-2 by FDA under an Emergency Use Authorization (EUA).  This EUA will remain in effect (meaning this test can be used) for the duration of the COVID-19 declaration under Section 564(b)(1)  of the Act, 21 U.S.C. section 360bbb-3(b)(1), unless the authorization is terminated or revoked sooner. Performed at [facility], [place] [address] [place], [place] [address]       Radiology Studies: Ct Angio Chest Pe W And/or Wo Contrast  Result Date: 05/24/2019 CLINICAL DATA:  Fever and body aches since Sunday. EXAM: CT ANGIOGRAPHY CHEST CT ABDOMEN AND PELVIS WITH CONTRAST TECHNIQUE: Multidetector CT imaging of the chest was performed using the standard protocol during bolus administration of intravenous contrast. Multiplanar CT image reconstructions and MIPs were obtained to evaluate the vascular anatomy. Multidetector CT imaging of the abdomen and pelvis was performed using the standard protocol during bolus administration of intravenous contrast. CONTRAST:  158mL OMNIPAQUE IOHEXOL 350 MG/ML SOLN COMPARISON:  Chest x-ray from same day. CT abdomen pelvis dated June 02, 2018. FINDINGS: CTA CHEST FINDINGS Cardiovascular: Satisfactory opacification of the pulmonary arteries to the segmental level. No evidence of pulmonary embolism. Normal heart size. No pericardial effusion. No thoracic aortic aneurysm or dissection. Mediastinum/Nodes: No enlarged mediastinal, hilar, or axillary lymph nodes. Thyroid gland, trachea, and esophagus demonstrate no significant findings. Lungs/Pleura: No focal consolidation, pleural effusion, or pneumothorax. Two 4 mm nodules in the right upper lobe (series 11, images 26 and 33). Musculoskeletal: No chest wall abnormality. No acute or significant osseous findings. Review of the MIP images confirms the above findings. CT ABDOMEN AND PELVIS FINDINGS Hepatobiliary: Unchanged diffuse hepatic steatosis. No focal liver abnormality. Prior cholecystectomy. No biliary dilatation. Pancreas: Unremarkable. No pancreatic ductal dilatation or surrounding inflammatory changes. Spleen: Normal in size without focal abnormality. Adrenals/Urinary Tract: Prior right adrenalectomy. The left  adrenal gland is unremarkable. Unchanged bilateral renal simple cysts. No renal calculi or hydronephrosis. The bladder is unremarkable. Stomach/Bowel: Stomach is within normal limits. Appendix appears normal. No evidence of bowel wall thickening, distention, or inflammatory changes. Extensive colonic diverticulosis again noted. 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 No enlarged abdominal or pelvic lymph nodes. Reproductive: Multiple calcified uterine fibroids again noted. No adnexal mass. Other: Unchanged tiny fat containing right inguinal and umbilical hernias. No free fluid or pneumoperitoneum. Musculoskeletal: No acute or significant osseous findings. Review of the MIP images confirms the above findings. IMPRESSION: Chest: 1. No evidence of pulmonary embolism. No acute intrathoracic process. 2. Two 4 mm pulmonary nodules in the right upper lobe. No follow-up needed if patient is low-risk (and has no known or suspected primary neoplasm). Non-contrast chest CT can be considered in 12 months if  patient is high-risk. This recommendation follows the consensus statement: Guidelines for Management of Incidental Pulmonary Nodules Detected on CT Images: From the Fleischner Society 2017; Radiology 2017; 284:228-243. Abdomen and pelvis: 1. 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 2. Extensive colonic diverticulosis again noted. No evidence of acute diverticulitis. 3. Unchanged hepatic steatosis. 4. Unchanged enlarged fibroid uterus. Electronically Signed   By: Titus Dubin M.D.   On: 05/24/2019 16:47   Ct Abdomen Pelvis W Contrast  Result Date: 05/24/2019 CLINICAL DATA:  Fever and body aches since Sunday. EXAM: CT ANGIOGRAPHY CHEST CT ABDOMEN AND PELVIS WITH CONTRAST  TECHNIQUE: Multidetector CT imaging of the chest was performed using the standard protocol during bolus administration of intravenous contrast. Multiplanar CT image reconstructions and MIPs were obtained to evaluate the vascular anatomy. Multidetector CT imaging of the abdomen and pelvis was performed using the standard protocol during bolus administration of intravenous contrast. CONTRAST:  166mL OMNIPAQUE IOHEXOL 350 MG/ML SOLN COMPARISON:  Chest x-ray from same day. CT abdomen pelvis dated June 02, 2018. FINDINGS: CTA CHEST FINDINGS Cardiovascular: Satisfactory opacification of the pulmonary arteries to the segmental level. No evidence of pulmonary embolism. Normal heart size. No pericardial effusion. No thoracic aortic aneurysm or dissection. Mediastinum/Nodes: No enlarged mediastinal, hilar, or axillary lymph nodes. Thyroid gland, trachea, and esophagus demonstrate no significant findings. Lungs/Pleura: No focal consolidation, pleural effusion, or pneumothorax. Two 4 mm nodules in the right upper lobe (series 11, images 26 and 33). Musculoskeletal: No chest wall abnormality. No acute or significant osseous findings. Review of the MIP images confirms the above findings. CT ABDOMEN AND PELVIS FINDINGS Hepatobiliary: Unchanged diffuse hepatic steatosis. No focal liver abnormality. Prior cholecystectomy. No biliary dilatation. Pancreas: Unremarkable. No pancreatic ductal dilatation or surrounding inflammatory changes. Spleen: Normal in size without focal abnormality. Adrenals/Urinary Tract: Prior right adrenalectomy. The left adrenal gland is unremarkable. Unchanged bilateral renal simple cysts. No renal calculi or hydronephrosis. The bladder is unremarkable. Stomach/Bowel: Stomach is within normal limits. Appendix appears normal. No evidence of bowel wall thickening, distention, or inflammatory changes. Extensive colonic diverticulosis again noted. Vascular/Lymphatic: Suspected nonocclusive thrombus within a  distal branch of the inferior mesenteric vein, with mild surrounding inflammatory changes (series 12, image 62; series 14, images 60-62). No enlarged abdominal or pelvic lymph nodes. Reproductive: Multiple calcified uterine fibroids again noted. No adnexal mass. Other: Unchanged tiny fat containing right inguinal and umbilical hernias. No free fluid or pneumoperitoneum. Musculoskeletal: No acute or significant osseous findings. Review of the MIP images confirms the above findings. IMPRESSION: Chest: 1. No evidence of pulmonary embolism. No acute intrathoracic process. 2. Two 4 mm pulmonary nodules in the right upper lobe. No follow-up needed if patient is low-risk (and has no known or suspected primary neoplasm). Non-contrast chest CT can be considered in 12 months if patient is high-risk. This recommendation follows the consensus statement: Guidelines for Management of Incidental Pulmonary Nodules Detected on CT Images: From the Fleischner Society 2017; Radiology 2017; 284:228-243. Abdomen and pelvis: 1. 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 2. Extensive colonic diverticulosis again noted. No evidence of acute diverticulitis. 3. Unchanged hepatic steatosis. 4. Unchanged enlarged fibroid uterus. Electronically Signed   By: Titus Dubin M.D.   On: 05/24/2019 16:47   Dg Chest Port 1 View  Result Date: 05/24/2019 CLINICAL DATA:  Fever and body aches. EXAM: PORTABLE CHEST 1 VIEW COMPARISON:  None. FINDINGS: The  heart size and mediastinal contours are within normal limits. Both lungs are clear. The visualized skeletal structures are unremarkable. IMPRESSION: No active cardiopulmonary disease. Electronically Signed   By: Abelardo Diesel M.D.   On: 05/24/2019 13:37    Scheduled Meds:  atorvastatin  10 mg Oral Daily   heparin  5,000 Units Subcutaneous Q8H   insulin aspart  0-9 Units Subcutaneous Q4H   insulin glargine   15 Units Subcutaneous Daily   promethazine  12.5 mg Intravenous Once   Continuous Infusions:  sodium chloride 125 mL/hr at 05/24/19 2337   cefTRIAXone (ROCEPHIN)  IV 2 g (05/25/19 0042)   metronidazole 500 mg (05/25/19 0720)     LOS: 1 day   Assessment & Plan:   Principal Problem:   Sepsis ([place]) Active Problems:   Essential hypertension   Abdominal pain   Insulin-requiring or dependent type II diabetes mellitus ([place])   Mesenteric vein thrombosis   SIRS / ?SEPSIS /nausea vomiting: She meets sirs criteria since there is no source of infection so far however she does have elevated procalcitonin indicating that she is brewing infection somewhere.  Blood cultures have been drawn and are negative so far.  Chest x-ray negative and UA negative as well.  CT abdomen negative for any acute pathology.  She is afebrile.  Continue Rocephin and vancomycin and follow blood culture and tailor antibiotics accordingly.  She does not have any nausea vomiting so I will start her on clear liquids.  Type 2 diabetes mellitus: Blood sugar much better on half dose of Lantus which is 15 units along with SSI.  Continue current management.  Essential hypertension: Blood pressure controlled.  Resume olmesartan and carvedilol but hold hydrochlorothiazide for now.  Bigeminy on telemetry: Reportedly, she had few episodes of bigeminy this morning without having any ACS symptoms.  Currently she has no symptoms here.  Continue to monitor on telemetry.  If frequent and persistent, consider cardiology consult.  Chronic kidney disease stage III: At baseline.  Continue to watch.  DVT prophylaxis: Lovenox Code Status: Full code Family Communication: None present at bedside.  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  Discussed plan of care with the patient in detail.  Answered all the questions she had. Disposition Plan: To be determined   Time spent: 34 minutes   Darliss Cheney, MD [facility] Pager [phone]  If 7PM-7AM, please contact night-coverage [email or web address] Password [email or web address] 05/25/2019, 10:30 AM

## 2019-05-26 LAB — CBC WITH DIFFERENTIAL/PLATELET
Abs Immature Granulocytes: 0.09 10*3/uL — ABNORMAL HIGH (ref 0.00–0.07)
Basophils Absolute: 0.1 10*3/uL (ref 0.0–0.1)
Basophils Relative: 1 %
Eosinophils Absolute: 0.4 10*3/uL (ref 0.0–0.5)
Eosinophils Relative: 3 %
HCT: 39.5 % (ref 36.0–46.0)
Hemoglobin: 12.2 g/dL (ref 12.0–15.0)
Immature Granulocytes: 1 %
Lymphocytes Relative: 22 %
Lymphs Abs: 3.1 10*3/uL (ref 0.7–4.0)
MCH: 21.8 pg — ABNORMAL LOW (ref 26.0–34.0)
MCHC: 30.9 g/dL (ref 30.0–36.0)
MCV: 70.7 fL — ABNORMAL LOW (ref 80.0–100.0)
Monocytes Absolute: 1.5 10*3/uL — ABNORMAL HIGH (ref 0.1–1.0)
Monocytes Relative: 11 %
Neutro Abs: 8.5 10*3/uL — ABNORMAL HIGH (ref 1.7–7.7)
Neutrophils Relative %: 62 %
Platelets: 282 10*3/uL (ref 150–400)
RBC: 5.59 MIL/uL — ABNORMAL HIGH (ref 3.87–5.11)
RDW: 16.5 % — ABNORMAL HIGH (ref 11.5–15.5)
WBC: 13.6 10*3/uL — ABNORMAL HIGH (ref 4.0–10.5)
nRBC: 0 % (ref 0.0–0.2)

## 2019-05-26 LAB — COMPREHENSIVE METABOLIC PANEL
ALT: 81 U/L — ABNORMAL HIGH (ref 0–44)
AST: 74 U/L — ABNORMAL HIGH (ref 15–41)
Albumin: 2.9 g/dL — ABNORMAL LOW (ref 3.5–5.0)
Alkaline Phosphatase: 66 U/L (ref 38–126)
Anion gap: 10 (ref 5–15)
BUN: 7 mg/dL (ref 6–20)
CO2: 24 mmol/L (ref 22–32)
Calcium: 8 mg/dL — ABNORMAL LOW (ref 8.9–10.3)
Chloride: 104 mmol/L (ref 98–111)
Creatinine, Ser: 0.88 mg/dL (ref 0.44–1.00)
GFR calc Af Amer: >60 mL/min (ref >60)
GFR calc non Af Amer: >60 mL/min (ref >60)
Glucose, Bld: 156 mg/dL — ABNORMAL HIGH (ref 70–99)
Potassium: 3.7 mmol/L (ref 3.5–5.1)
Sodium: 138 mmol/L (ref 135–145)
Total Bilirubin: 0.7 mg/dL (ref 0.3–1.2)
Total Protein: 6.8 g/dL (ref 6.5–8.1)

## 2019-05-26 LAB — HEMOGLOBIN A1C
Hgb A1c MFr Bld: 9.5 % — ABNORMAL HIGH (ref 4.8–5.6)
Hgb A1c MFr Bld: 9.1 % — ABNORMAL HIGH (ref 4.8–5.6)
Mean Plasma Glucose: 226 mg/dL
Mean Plasma Glucose: 214 mg/dL

## 2019-05-26 LAB — GLUCOSE, CAPILLARY
Glucose-Capillary: 155 mg/dL — ABNORMAL HIGH (ref 70–99)
Glucose-Capillary: 148 mg/dL — ABNORMAL HIGH (ref 70–99)
Glucose-Capillary: 154 mg/dL — ABNORMAL HIGH (ref 70–99)
Glucose-Capillary: 174 mg/dL — ABNORMAL HIGH (ref 70–99)

## 2019-05-26 LAB — PROCALCITONIN: Procalcitonin: 0.19 ng/mL

## 2019-05-26 MED ORDER — DICYCLOMINE HCL 10 MG PO CAPS
10.0000 mg | ORAL_CAPSULE | Freq: Two times a day (BID) | ORAL | Status: DC | PRN
  Administered 2019-05-26 – 2019-05-28 (×3): 10 mg via ORAL
  Filled 2019-05-26 (×3): qty 1

## 2019-05-26 NOTE — Progress Notes (Signed)
PROGRESS NOTE    Carol Mueller  XHB:[id number] DOB: [birth year] DOA: 05/24/2019 PCP: Shawnee Knapp, MD   Brief Narrative:  Carol Mueller. Carol Mueller is a 58 year old female with a past medical history of unilateral adrenalectomy for Cushing's syndrome, type 2 diabetes mellitus on insulin, hypertension, hyperlipidemia and diverticulosis who presented to ED on 05/24/2019 with complaint of fever, chills body aches nausea for past 4 days.  No shortness of breath, cough or rash or dysuria or any urinary complaints.  No bowel complaints either.  Per patient, she was having high-grade fever at home up to 103.  Upon arrival to the emergency department, she was febrile with 102.2.  Slightly tachypneic with 28 of respiratory rate and heart rate of 102.  She was also having leukocytosis.  She was given IV cefepime, Flagyl and vancomycin in the ED and was admitted under hospitalist service with working diagnosis of sepsis of unknown source.  She was COVID-19 negative.  She also had CT angiogram of the chest and was ruled out of PE however she was found to have incidental finding of thrombus in the distal inferior mesenteric vein for which vascular surgery was consulted through the emergency department.  Per vascular surgery recommendation, patient does not need any systemic anticoagulation for this.  Consultants:   Vascular surgery  Procedures:   None  Antimicrobials:   IV Rocephin and Flagyl started 05/24/2019.   Subjective: Patient seen and examined.  She complains of body ache.  She has been having persistent fever since admission.  No specific complaint.  Objective: Vitals:   05/26/19 0258 05/26/19 0515 05/26/19 0618 05/26/19 1041  BP: (!) 148/86 (!) 148/85 (!) 156/67   Pulse: 100 (!) 102 93   Resp: 16  18   Temp: (!) 100.5 F (38.1 C) 100.2 F (37.9 C) 99.2 F (37.3 C) (!) 100.8 F (38.2 C)  TempSrc: Oral Oral Oral Oral  SpO2: 94% 97% 98%   Weight:      Height:        Intake/Output Summary (Last  24 hours) at 05/26/2019 1256 Last data filed at 05/26/2019 0900 Gross per 24 hour  Intake 4263.55 ml  Output --  Net 4263.55 ml   Filed Weights   05/24/19 1515 05/25/19 0609  Weight: 112.9 kg 117.9 kg    Examination: General exam: Appears calm and comfortable  Respiratory system: Clear to auscultation. Respiratory effort normal. Cardiovascular system: S1 & S2 heard, RRR. No JVD, murmurs, rubs, gallops or clicks. No pedal edema. Gastrointestinal system: Abdomen is nondistended, soft and nontender. No organomegaly or masses felt. Normal bowel sounds heard. Central nervous system: Alert and oriented. No focal neurological deficits. Extremities: Symmetric 5 x 5 power. Skin: No rashes, lesions or ulcers Psychiatry: Judgement and insight appear normal. Mood & affect appropriate. .    Data Reviewed: I have personally reviewed following labs and imaging studies  CBC: Recent Labs  Lab 05/24/19 1327 05/25/19 0244 05/26/19 0907  WBC 13.1* 12.4* 13.6*  NEUTROABS 9.1*  --  8.5*  HGB 13.7 12.3 12.2  HCT 43.3 38.8 39.5  MCV 69.5* 69.8* 70.7*  PLT 263 256 810   Basic Metabolic Panel: Recent Labs  Lab 05/24/19 1327 05/25/19 0244 05/25/19 0617 05/26/19 0230  NA 132* 135  --  138  K 3.8 3.5  --  3.7  CL 93* 100  --  104  CO2 24 23  --  24  GLUCOSE 175* 105*  --  156*  BUN 18 12  --  7  CREATININE 1.14* 1.03*  --  0.88  CALCIUM 9.5 8.3*  --  8.0*  MG  --   --  1.8  --    GFR: Estimated Creatinine Clearance: 86.5 mL/min (by C-G formula based on SCr of 0.88 mg/dL). Liver Function Tests: Recent Labs  Lab 05/24/19 1327 05/25/19 0244 05/26/19 0230  AST 75* 54* 74*  ALT 88* 71* 81*  ALKPHOS 66 59 66  BILITOT 0.8 0.8 0.7  PROT 8.2* 6.9 6.8  ALBUMIN 3.7 3.1* 2.9*   Recent Labs  Lab 05/24/19 1327  LIPASE 40   No results for input(s): AMMONIA in the last 168 hours. Coagulation Profile: No results for input(s): INR, PROTIME in the last 168 hours. Cardiac Enzymes: No  results for input(s): CKTOTAL, CKMB, CKMBINDEX, TROPONINI in the last 168 hours. BNP (last 3 results) No results for input(s): PROBNP in the last 8760 hours. HbA1C: Recent Labs    05/24/19 1327 05/25/19 1801  HGBA1C 9.5* 9.1*   CBG: Recent Labs  Lab 05/25/19 1338 05/25/19 1644 05/25/19 2159 05/26/19 0758 05/26/19 1225  GLUCAP 159* 120* 148* 155* 148*   Lipid Profile: No results for input(s): CHOL, HDL, LDLCALC, TRIG, CHOLHDL, LDLDIRECT in the last 72 hours. Thyroid Function Tests: No results for input(s): TSH, T4TOTAL, FREET4, T3FREE, THYROIDAB in the last 72 hours. Anemia Panel: No results for input(s): VITAMINB12, FOLATE, FERRITIN, TIBC, IRON, RETICCTPCT in the last 72 hours. Sepsis Labs: Recent Labs  Lab 05/24/19 1327 05/24/19 2304 05/25/19 0244 05/25/19 0617 05/26/19 0907  PROCALCITON 0.47  --  0.38  --  0.19  LATICACIDVEN 1.8 1.1 1.0 0.8  --     Recent Results (from the past 240 hour(s))  Culture, blood (routine x 2)     Status: None (Preliminary result)   Collection Time: 05/24/19  1:26 PM   Specimen: BLOOD  Result Value Ref Range Status   Specimen Description BLOOD LEFT ANTECUBITAL  Final   Special Requests   Final    BOTTLES DRAWN AEROBIC AND ANAEROBIC Blood Culture adequate volume   Culture   Final    NO GROWTH 2 DAYS Performed at [facility], [place] [address], [place], [place] [address]    Report Status PENDING  Incomplete  SARS Coronavirus 2 (CEPHEID- Performed in [facility] hospital lab), Hosp Order     Status: None   Collection Time: 05/24/19  1:27 PM   Specimen: Nasopharyngeal Swab  Result Value Ref Range Status   SARS Coronavirus 2 NEGATIVE NEGATIVE Final    Comment: (NOTE) If result is NEGATIVE SARS-CoV-2 target nucleic acids are NOT DETECTED. The SARS-CoV-2 RNA is generally detectable in upper and lower  respiratory specimens during the acute phase of infection. The lowest  concentration of SARS-CoV-2 viral copies this assay can  detect is 250  copies / mL. A negative result does not preclude SARS-CoV-2 infection  and should not be used as the sole basis for treatment or other  patient management decisions.  A negative result may occur with  improper specimen collection / handling, submission of specimen other  than nasopharyngeal swab, presence of viral mutation(s) within the  areas targeted by this assay, and inadequate number of viral copies  (<250 copies / mL). A negative result must be combined with clinical  observations, patient history, and epidemiological information. If result is POSITIVE SARS-CoV-2 target nucleic acids are DETECTED. The SARS-CoV-2 RNA is generally detectable in upper and lower  respiratory specimens dur ing the acute phase of  infection.  Positive  results are indicative of active infection with SARS-CoV-2.  Clinical  correlation with patient history and other diagnostic information is  necessary to determine patient infection status.  Positive results do  not rule out bacterial infection or co-infection with other viruses. If result is PRESUMPTIVE POSTIVE SARS-CoV-2 nucleic acids MAY BE PRESENT.   A presumptive positive result was obtained on the submitted specimen  and confirmed on repeat testing.  While 2019 novel coronavirus  (SARS-CoV-2) nucleic acids may be present in the submitted sample  additional confirmatory testing may be necessary for epidemiological  and / or clinical management purposes  to differentiate between  SARS-CoV-2 and other Sarbecovirus currently known to infect humans.  If clinically indicated additional testing with an alternate test  methodology [phone]) is advised. The SARS-CoV-2 RNA is generally  detectable in upper and lower respiratory sp ecimens during the acute  phase of infection. The expected result is Negative. Fact Sheet for Patients:  StrictlyIdeas.no Fact Sheet for Healthcare  Providers: BankingDealers.co.za This test is not yet approved or cleared by the [place] FDA and has been authorized for detection and/or diagnosis of SARS-CoV-2 by FDA under an Emergency Use Authorization (EUA).  This EUA will remain in effect (meaning this test can be used) for the duration of the COVID-19 declaration under Section 564(b)(1) of the Act, 21 U.S.C. section 360bbb-3(b)(1), unless the authorization is terminated or revoked sooner. Performed at [facility], [place] [address] [place], [place] [address]   Culture, blood (routine x 2)     Status: None (Preliminary result)   Collection Time: 05/24/19  1:35 PM   Specimen: BLOOD  Result Value Ref Range Status   Specimen Description BLOOD RIGHT ANTECUBITAL  Final   Special Requests   Final    BOTTLES DRAWN AEROBIC AND ANAEROBIC Blood Culture results may not be optimal due to an inadequate volume of blood received in culture bottles   Culture   Final    NO GROWTH 2 DAYS Performed at [facility], [place] [address] [place], [place] [address]    Report Status PENDING  Incomplete  Urine culture     Status: None   Collection Time: 05/24/19  2:34 PM   Specimen: Urine, Random  Result Value Ref Range Status   Specimen Description URINE, RANDOM  Final   Special Requests NONE  Final   Culture   Final    NO GROWTH Performed at [facility], [place] [address], [place],  [address]    Report Status 05/25/2019 FINAL  Final      Radiology Studies: Ct Angio Chest Pe W And/or Wo Contrast  Result Date: 05/24/2019 CLINICAL DATA:  Fever and body aches since Sunday. EXAM: CT ANGIOGRAPHY CHEST CT ABDOMEN AND PELVIS WITH CONTRAST TECHNIQUE: Multidetector CT imaging of the chest was performed using the standard protocol during bolus administration of intravenous contrast. Multiplanar CT image reconstructions and MIPs were obtained to evaluate the vascular anatomy. Multidetector CT imaging of the  abdomen and pelvis was performed using the standard protocol during bolus administration of intravenous contrast. CONTRAST:  140mL OMNIPAQUE IOHEXOL 350 MG/ML SOLN COMPARISON:  Chest x-ray from same day. CT abdomen pelvis dated June 02, 2018. FINDINGS: CTA CHEST FINDINGS Cardiovascular: Satisfactory opacification of the pulmonary arteries to the segmental level. No evidence of pulmonary embolism. Normal heart size. No pericardial effusion. No thoracic aortic aneurysm or dissection. Mediastinum/Nodes: No enlarged mediastinal, hilar, or axillary lymph nodes. Thyroid gland, trachea, and  esophagus demonstrate no significant findings. Lungs/Pleura: No focal consolidation, pleural effusion, or pneumothorax. Two 4 mm nodules in the right upper lobe (series 11, images 26 and 33). Musculoskeletal: No chest wall abnormality. No acute or significant osseous findings. Review of the MIP images confirms the above findings. CT ABDOMEN AND PELVIS FINDINGS Hepatobiliary: Unchanged diffuse hepatic steatosis. No focal liver abnormality. Prior cholecystectomy. No biliary dilatation. Pancreas: Unremarkable. No pancreatic ductal dilatation or surrounding inflammatory changes. Spleen: Normal in size without focal abnormality. Adrenals/Urinary Tract: Prior right adrenalectomy. The left adrenal gland is unremarkable. Unchanged bilateral renal simple cysts. No renal calculi or hydronephrosis. The bladder is unremarkable. Stomach/Bowel: Stomach is within normal limits. Appendix appears normal. No evidence of bowel wall thickening, distention, or inflammatory changes. Extensive colonic diverticulosis again noted. 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 No enlarged abdominal or pelvic lymph nodes. Reproductive: Multiple calcified uterine fibroids again noted. No adnexal mass. Other:  Unchanged tiny fat containing right inguinal and umbilical hernias. No free fluid or pneumoperitoneum. Musculoskeletal: No acute or significant osseous findings. Review of the MIP images confirms the above findings. IMPRESSION: Chest: 1. No evidence of pulmonary embolism. No acute intrathoracic process. 2. Two 4 mm pulmonary nodules in the right upper lobe. No follow-up needed if patient is low-risk (and has no known or suspected primary neoplasm). Non-contrast chest CT can be considered in 12 months if patient is high-risk. This recommendation follows the consensus statement: Guidelines for Management of Incidental Pulmonary Nodules Detected on CT Images: From the Fleischner Society 2017; Radiology 2017; 284:228-243. Abdomen and pelvis: 1. 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 2. Extensive colonic diverticulosis again noted. No evidence of acute diverticulitis. 3. Unchanged hepatic steatosis. 4. Unchanged enlarged fibroid uterus. Electronically Signed   By: Titus Dubin M.D.   On: 05/24/2019 16:47   Ct Abdomen Pelvis W Contrast  Result Date: 05/24/2019 CLINICAL DATA:  Fever and body aches since Sunday. EXAM: CT ANGIOGRAPHY CHEST CT ABDOMEN AND PELVIS WITH CONTRAST TECHNIQUE: Multidetector CT imaging of the chest was performed using the standard protocol during bolus administration of intravenous contrast. Multiplanar CT image reconstructions and MIPs were obtained to evaluate the vascular anatomy. Multidetector CT imaging of the abdomen and pelvis was performed using the standard protocol during bolus administration of intravenous contrast. CONTRAST:  167mL OMNIPAQUE IOHEXOL 350 MG/ML SOLN COMPARISON:  Chest x-ray from same day. CT abdomen pelvis dated June 02, 2018. FINDINGS: CTA CHEST FINDINGS Cardiovascular: Satisfactory opacification of the pulmonary arteries to the segmental level. No evidence of pulmonary  embolism. Normal heart size. No pericardial effusion. No thoracic aortic aneurysm or dissection. Mediastinum/Nodes: No enlarged mediastinal, hilar, or axillary lymph nodes. Thyroid gland, trachea, and esophagus demonstrate no significant findings. Lungs/Pleura: No focal consolidation, pleural effusion, or pneumothorax. Two 4 mm nodules in the right upper lobe (series 11, images 26 and 33). Musculoskeletal: No chest wall abnormality. No acute or significant osseous findings. Review of the MIP images confirms the above findings. CT ABDOMEN AND PELVIS FINDINGS Hepatobiliary: Unchanged diffuse hepatic steatosis. No focal liver abnormality. Prior cholecystectomy. No biliary dilatation. Pancreas: Unremarkable. No pancreatic ductal dilatation or surrounding inflammatory changes. Spleen: Normal in size without focal abnormality. Adrenals/Urinary Tract: Prior right adrenalectomy. The left adrenal gland is unremarkable. Unchanged bilateral renal simple cysts. No renal calculi or hydronephrosis. The bladder is unremarkable. Stomach/Bowel: Stomach  is within normal limits. Appendix appears normal. No evidence of bowel wall thickening, distention, or inflammatory changes. Extensive colonic diverticulosis again noted. 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 No enlarged abdominal or pelvic lymph nodes. Reproductive: Multiple calcified uterine fibroids again noted. No adnexal mass. Other: Unchanged tiny fat containing right inguinal and umbilical hernias. No free fluid or pneumoperitoneum. Musculoskeletal: No acute or significant osseous findings. Review of the MIP images confirms the above findings. IMPRESSION: Chest: 1. No evidence of pulmonary embolism. No acute intrathoracic process. 2. Two 4 mm pulmonary nodules in the right upper lobe. No follow-up needed if patient is  low-risk (and has no known or suspected primary neoplasm). Non-contrast chest CT can be considered in 12 months if patient is high-risk. This recommendation follows the consensus statement: Guidelines for Management of Incidental Pulmonary Nodules Detected on CT Images: From the Fleischner Society 2017; Radiology 2017; 284:228-243. Abdomen and pelvis: 1. 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 2. Extensive colonic diverticulosis again noted. No evidence of acute diverticulitis. 3. Unchanged hepatic steatosis. 4. Unchanged enlarged fibroid uterus. Electronically Signed   By: Titus Dubin M.D.   On: 05/24/2019 16:47   Dg Chest Port 1 View  Result Date: 05/24/2019 CLINICAL DATA:  Fever and body aches. EXAM: PORTABLE CHEST 1 VIEW COMPARISON:  None. FINDINGS: The heart size and mediastinal contours are within normal limits. Both lungs are clear. The visualized skeletal structures are unremarkable. IMPRESSION: No active cardiopulmonary disease. Electronically Signed   By: Abelardo Diesel M.D.   On: 05/24/2019 13:37    Scheduled Meds:  atorvastatin  10 mg Oral Daily   carvedilol  3.125 mg Oral BID WC   heparin  5,000 Units Subcutaneous Q8H   insulin aspart  0-15 Units Subcutaneous TID WC   insulin aspart  0-5 Units Subcutaneous QHS   insulin glargine  15 Units Subcutaneous Daily   irbesartan  300 mg Oral Daily   promethazine  12.5 mg Intravenous Once   Continuous Infusions:  sodium chloride 125 mL/hr at 05/26/19 1059   cefTRIAXone (ROCEPHIN)  IV 2 g (05/25/19 2211)   metronidazole 500 mg (05/26/19 0821)     LOS: 2 days   Assessment & Plan:   Principal Problem:   Sepsis ([place]) Active Problems:   Essential hypertension   Abdominal pain   Insulin-requiring or dependent type II diabetes mellitus ([place])   Mesenteric vein thrombosis   SIRS / ?SEPSIS /nausea vomiting: She meets sirs criteria  since there is no source of infection so far however she does have elevated procalcitonin indicating that she is brewing infection somewhere However her procalcitonin is improving with antibiotics..  Blood cultures have been drawn and are negative so far.  Chest x-ray negative and UA negative as well.  CT abdomen negative for any acute pathology.  She has been having intermittent fever since admission but once again, she had CT abdomen pelvis with contrast with no acute pathology seen.  She does not have any specific symptoms for me to pursue any organ system infection.  Continue Rocephin and follow culture and tailor antibiotics accordingly.  She does not have any more nausea vomiting and she is requesting to advance her diet so I will put her on regular diet.  Type 2 diabetes mellitus: Blood sugar much better on half dose of Lantus which  is 15 units along with SSI.  Continue current management.  Essential hypertension: Blood pressure controlled.  Continue olmesartan and carvedilol but hold hydrochlorothiazide for now.  Bigeminy on telemetry: Reportedly, she had few episodes of bigeminy this morning without having any ACS symptoms more than 24 hours ago.  Currently she has no symptoms here.  Continue to monitor on telemetry.  If frequent and persistent, consider cardiology consult.  Chronic kidney disease stage III: At baseline.  Continue to watch.  DVT prophylaxis: Lovenox Code Status: Full code Family Communication: None present at bedside.  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  Discussed plan of care with the patient in detail.  Answered all the questions she had. Disposition Plan: To be determined   Time spent: 27 minutes   Darliss Cheney, MD [facility] Pager [phone]  If 7PM-7AM, please contact night-coverage [email or web address] Password [facility] 05/26/2019, 12:56 PM

## 2019-05-26 NOTE — Plan of Care (Signed)
  Problem: Education: Goal: Knowledge of General Education information will improve Description: Including pain rating scale, medication(s)/side effects and non-pharmacologic comfort measures Outcome: Progressing   Problem: Health Behavior/Discharge Planning: Goal: Ability to manage health-related needs will improve Outcome: Progressing   Problem: Clinical Measurements: Goal: Will remain free from infection Outcome: Progressing   Problem: Nutrition: Goal: Adequate nutrition will be maintained Outcome: Not Progressing    Patient continues on IV antibiotics, spiking fever intermittently, fever treated with prn medication.   Patient also verbalized poor appetite, and need for nutrition supplement.

## 2019-05-26 NOTE — Progress Notes (Signed)
Notified by central tele at 2231 that patient had bigeminy PVC's.  Noted in physician's progress note that this had also happened last night.  Patient asymptomatic on assessment.  Notified [facility] Jeannette Corpus.  Will continue to monitor.

## 2019-05-26 NOTE — Plan of Care (Signed)

## 2019-05-26 NOTE — Progress Notes (Signed)
Pt complained of SOB after ambulating from the bathroom. Pt's O2 sat was 92% on room air and raised the bed to 90 degrees. Placed pt on 2L  for comfort and asked pt to take some deep breaths. Pt O2 sat at 98% on 2L. Told pt to wear O2 as needed. Will continue to monitor.

## 2019-05-27 LAB — COMPREHENSIVE METABOLIC PANEL
ALT: 67 U/L — ABNORMAL HIGH (ref 0–44)
AST: 45 U/L — ABNORMAL HIGH (ref 15–41)
Albumin: 2.8 g/dL — ABNORMAL LOW (ref 3.5–5.0)
Alkaline Phosphatase: 71 U/L (ref 38–126)
Anion gap: 11 (ref 5–15)
BUN: 6 mg/dL (ref 6–20)
CO2: 22 mmol/L (ref 22–32)
Calcium: 7.9 mg/dL — ABNORMAL LOW (ref 8.9–10.3)
Chloride: 107 mmol/L (ref 98–111)
Creatinine, Ser: 0.79 mg/dL (ref 0.44–1.00)
GFR calc Af Amer: >60 mL/min (ref >60)
GFR calc non Af Amer: >60 mL/min (ref >60)
Glucose, Bld: 169 mg/dL — ABNORMAL HIGH (ref 70–99)
Potassium: 3.7 mmol/L (ref 3.5–5.1)
Sodium: 140 mmol/L (ref 135–145)
Total Bilirubin: 0.8 mg/dL (ref 0.3–1.2)
Total Protein: 6.9 g/dL (ref 6.5–8.1)

## 2019-05-27 LAB — CBC WITH DIFFERENTIAL/PLATELET
Abs Immature Granulocytes: 0.18 10*3/uL — ABNORMAL HIGH (ref 0.00–0.07)
Basophils Absolute: 0.1 10*3/uL (ref 0.0–0.1)
Basophils Relative: 1 %
Eosinophils Absolute: 0.3 10*3/uL (ref 0.0–0.5)
Eosinophils Relative: 2 %
HCT: 38.5 % (ref 36.0–46.0)
Hemoglobin: 11.9 g/dL — ABNORMAL LOW (ref 12.0–15.0)
Immature Granulocytes: 1 %
Lymphocytes Relative: 23 %
Lymphs Abs: 3.3 10*3/uL (ref 0.7–4.0)
MCH: 21.8 pg — ABNORMAL LOW (ref 26.0–34.0)
MCHC: 30.9 g/dL (ref 30.0–36.0)
MCV: 70.6 fL — ABNORMAL LOW (ref 80.0–100.0)
Monocytes Absolute: 1.4 10*3/uL — ABNORMAL HIGH (ref 0.1–1.0)
Monocytes Relative: 10 %
Neutro Abs: 8.8 10*3/uL — ABNORMAL HIGH (ref 1.7–7.7)
Neutrophils Relative %: 63 %
Platelets: 359 10*3/uL (ref 150–400)
RBC: 5.45 MIL/uL — ABNORMAL HIGH (ref 3.87–5.11)
RDW: 16.5 % — ABNORMAL HIGH (ref 11.5–15.5)
WBC: 14.1 10*3/uL — ABNORMAL HIGH (ref 4.0–10.5)
nRBC: 0 % (ref 0.0–0.2)

## 2019-05-27 LAB — GLUCOSE, CAPILLARY
Glucose-Capillary: 135 mg/dL — ABNORMAL HIGH (ref 70–99)
Glucose-Capillary: 166 mg/dL — ABNORMAL HIGH (ref 70–99)
Glucose-Capillary: 237 mg/dL — ABNORMAL HIGH (ref 70–99)
Glucose-Capillary: 191 mg/dL — ABNORMAL HIGH (ref 70–99)

## 2019-05-27 LAB — C DIFFICILE QUICK SCREEN W PCR REFLEX
C Diff antigen: NEGATIVE
C Diff interpretation: NOT DETECTED
C Diff toxin: NEGATIVE

## 2019-05-27 MED ORDER — METRONIDAZOLE 500 MG PO TABS
500.0000 mg | ORAL_TABLET | Freq: Three times a day (TID) | ORAL | Status: DC
  Administered 2019-05-27 – 2019-05-29 (×7): 500 mg via ORAL
  Filled 2019-05-27 (×7): qty 1

## 2019-05-27 NOTE — Plan of Care (Signed)

## 2019-05-27 NOTE — Consult Note (Signed)
[place] for Infectious Disease       Reason for Consult: FUO    Referring Physician: Darliss Cheney, MD  Principal Problem:   Sepsis [facility]) Active Problems:   Essential hypertension   Abdominal pain   Insulin-requiring or dependent type II diabetes mellitus ([place])   Mesenteric vein thrombosis   . atorvastatin  10 mg Oral Daily  . carvedilol  3.125 mg Oral BID WC  . heparin  5,000 Units Subcutaneous Q8H  . insulin aspart  0-15 Units Subcutaneous TID WC  . insulin aspart  0-5 Units Subcutaneous QHS  . insulin glargine  15 Units Subcutaneous Daily  . irbesartan  300 mg Oral Daily  . promethazine  12.5 mg Intravenous Once    Recommendations: 1. Fever -as the patient has negative blood cultures and urine cultures, I suspect that her mesenteric thrombus noted to the inferior mesenteric vein although nonocclusive may indeed be the source of her fever.  Given her symptomatology onset approximately 24 hours after consuming a meal with poultry, Salmonella enteritis would be the most likely possibility in the setting.  It is reassuring that the patient has declined in her fever each day thus far during her hospitalization.  Would check stool PCR's for enteric pathogen, recognizing that the past 3 days of antibiotics may significantly decrease the yield of discovering the offending germ.  Continue Rocephin and Flagyl empirically for now but if the patient continues to improve clinically, may transition her to a total course of 10 days of combined Cipro and Flagyl p.o. to complete her treatment.  Would repeat the patient's blood cultures x2 for any fever greater than 100.5 and encourage ambulation particularly as her nonocclusive mesenteric thrombus is being conservatively managed due to her history of GI bleeds.  Her COVID-19 test was negative.  Consider checking Doppler ultrasounds of her lower extremities to rule out any DVT.  2. Leukocytosis -this has waxed and waned thus far during her  hospitalization.  Imaging showed no evidence of diverticular abscess or other complicating features at this time; however, the patient does have an increased risk for associated diverticulitis given her personal history of diverticulosis with multiple GI bleeds in the past.  Continue her antibiotics as noted above I would transition her Flagyl to p.o. 500 mg every 8 hrs, especially while awaiting results of C. difficile testing.  We will sign off, call with any questions  Assessment: The patient is a 58 y/o AAF diabetic with remote h/o adrenal adenoma, s/p adrenalectomy admitted with fever, leukocytosis, and diarrhea.  Antibiotics: Rocephin, day 3 Flagyl, day 3  HPI: Carol Mueller is a 58 y.o. female diabetic with remote history of an adrenal adenoma status post adrenalectomy admitted with a 4-5 day history of fever and diarrhea.  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  She does recall that she did eat chicken the day prior to onset of her symptoms as takeout from a [employer]. She does not believe the chicken was undercooked but she states that she cannot say for sure.  She denies eating any poorly cooked beef or egg products.  She often prepares her own meals with exception of twice a week when she orders takeout since the pandemic began.  She denies any dysuria, shortness of breath, headache, nausea, or vomiting.  She initially developed extreme fevers and chills for  the first 2 days of her symptoms.  Approximately 24 hours after consuming the meal and question patient began developing diarrhea with low pelvic pain.  Given that she has history of GI bleeds in the past, she was assessed by the general surgery service as her contrasted CT of the abdomen pelvis did show a nonocclusive inferior mesenteric vein thrombus.  Surgical intervention and anticoagulation were  both deferred.  She was empirically started on cefepime, which was then transitioned to Rocephin and Flagyl the following day.  Her initial fever of 103 has improved daily but her white blood cell count has waxed and waned throughout her hospital course thus far.  She has been able to tolerate an oral diet over the past 2 days with the assistance of antiemetics.  We are asked evaluate the patient for her fever of unknown origin. WBC trends, fever curve, imaging, cx results, and ABX usage all independently reviewed.  Review of Systems:  Review of Systems  Constitutional: Positive for chills and fever. Negative for weight loss.  HENT: Negative for congestion, hearing loss, sinus pain and sore throat.   Eyes: Negative for blurred vision, photophobia and discharge.  Respiratory: Negative for cough, hemoptysis and shortness of breath.   Cardiovascular: Negative for chest pain, palpitations, orthopnea and leg swelling.  Gastrointestinal: Positive for abdominal pain and diarrhea. Negative for constipation, heartburn, nausea and vomiting.  Genitourinary: Negative for dysuria, flank pain, frequency and urgency.  Musculoskeletal: Negative for back pain, joint pain and myalgias.  Skin: Negative for itching and rash.  Neurological: Negative for tremors, seizures, weakness and headaches.  Endo/Heme/Allergies: Negative for polydipsia. Does not bruise/bleed easily.  Psychiatric/Behavioral: Negative for depression and substance abuse. The patient is not nervous/anxious and does not have insomnia.      All other systems reviewed and are negative    Past Medical History:  Diagnosis Date  . Anemia   . 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 Diabetes mellitus without complication ([facility]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  . Diverticulitis   . Diverticulosis   . GI bleed   . Hyperlipidemia   . Hypertension   . Polycystic ovary disease   . Sepsis ([place]) 05/2019  s/p unilateral adrenalectomy  Social  History   Tobacco Use  . Smoking status: Never Smoker  . Smokeless tobacco: Never Used  Substance Use Topics  . Alcohol use: No  . Drug use: No    Family History  Problem Relation Age of Onset  . Hypertension Mother   . Cancer Mother        ?uterine or ovarian cancer  . Hypertension Father   . Diabetes Father   . Hypertension Sister   . Diabetes Sister   . Hypertension Brother   . Diabetes Brother   . Hypertension Maternal Grandfather   . Diabetes Paternal Grandmother   . Breast cancer Maternal Grandmother   . Colon cancer Neg Hx      Current Facility-Administered Medications:  .  0.9 %  sodium chloride infusion, , Intravenous, Continuous, Etta Quill, DO, Last Rate: 125 mL/hr at 05/27/19 7106 .  acetaminophen (TYLENOL) tablet 650 mg, 650 mg, Oral, Q6H PRN, 650 mg at 05/26/19 1048 **OR** acetaminophen (TYLENOL) suppository 650 mg, 650 mg, Rectal, Q6H PRN, Etta Quill, DO .  atorvastatin (LIPITOR) tablet 10 mg, 10 mg, Oral, Daily, Mesner, Corene Cornea, MD, 10 mg at 05/27/19 0946 .  carvedilol (COREG) tablet 3.125 mg, 3.125 mg, Oral, BID WC, Pahwani, Ravi,  MD, 3.125 mg at 05/27/19 0946 .  cefTRIAXone (ROCEPHIN) 2 g in sodium chloride 0.9 % 100 mL IVPB, 2 g, Intravenous, Q24H, Gardner, Jared M, DO, Stopped at 05/26/19 2317 .  dicyclomine (BENTYL) capsule 10 mg, 10 mg, Oral, BID PRN, Blount, Xenia T, NP, 10 mg at 05/27/19 1007 .  heparin injection 5,000 Units, 5,000 Units, Subcutaneous, Q8H, Gardner, Jared M, DO .  insulin aspart (novoLOG) injection 0-15 Units, 0-15 Units, Subcutaneous, TID WC, Darliss Cheney, MD, 2 Units at 05/27/19 0947 .  insulin aspart (novoLOG) injection 0-5 Units, 0-5 Units, Subcutaneous, QHS, Pahwani, Ravi, MD .  insulin glargine (LANTUS) injection 15 Units, 15 Units, Subcutaneous, Daily, Etta Quill, DO, 15 Units at 05/27/19 0947 .  irbesartan (AVAPRO) tablet 300 mg, 300 mg, Oral, Daily, Pahwani, Ravi, MD, 300 mg at 05/27/19 0946 .  metroNIDAZOLE  (FLAGYL) IVPB 500 mg, 500 mg, Intravenous, Q8H, Gardner, Jared M, DO, Last Rate: 100 mL/hr at 05/27/19 0637, 500 mg at 05/27/19 0637 .  morphine 2 MG/ML injection 2-4 mg, 2-4 mg, Intravenous, Q4H PRN, Etta Quill, DO, 2 mg at 05/25/19 1706 .  ondansetron (ZOFRAN) tablet 4 mg, 4 mg, Oral, Q6H PRN **OR** ondansetron (ZOFRAN) injection 4 mg, 4 mg, Intravenous, Q6H PRN, Etta Quill, DO, 4 mg at 05/25/19 0559 .  promethazine (PHENERGAN) injection 12.5 mg, 12.5 mg, Intravenous, Once, Schorr, Rhetta Mura, NP, Stopped at 05/25/19 9983  Allergies  Allergen Reactions  . Norvasc [Amlodipine Besylate] Swelling    ANGIOEDEMA  . Nsaids Other (See Comments)    GI upset/ GI bleed    Vitals:   05/26/19 2130 05/27/19 0542  BP: (!) 153/93 (!) 154/100  Pulse: (!) 103 98  Resp: 18 16  Temp: 99.5 F (37.5 C) 98.4 F (36.9 C)  SpO2: 97% 96%     Physical Exam Gen: pleasant, NAD, A&Ox 3 Head: NCAT, no temporal wasting evident EENT: PERRL, EOMI, MMM, adequate dentition Neck: supple, no JVD CV: NRRR, no murmurs evident Pulm: CTA bilaterally, no wheeze or retractions Abd: soft,mild TTP at LLQ and less so at her RLQ, +BS Extrems:  no LE edema, 2+ pulses Skin: no rashes, adequate skin turgor Neuro: CN II-XII grossly intact, no focal neurologic deficits appreciated, gait was not assessed, A&Ox 3   Lab Results  Component Value Date   WBC 14.1 (H) 05/27/2019   HGB 11.9 (L) 05/27/2019   HCT 38.5 05/27/2019   MCV 70.6 (L) 05/27/2019   PLT 359 05/27/2019    Lab Results  Component Value Date   CREATININE 0.79 05/27/2019   BUN 6 05/27/2019   NA 140 05/27/2019   K 3.7 05/27/2019   CL 107 05/27/2019   CO2 22 05/27/2019    Lab Results  Component Value Date   ALT 67 (H) 05/27/2019   AST 45 (H) 05/27/2019   ALKPHOS 71 05/27/2019     Microbiology: Recent Results (from the past 240 hour(s))  Culture, blood (routine x 2)     Status: None (Preliminary result)   Collection Time: 05/24/19   1:26 PM   Specimen: BLOOD  Result Value Ref Range Status   Specimen Description BLOOD LEFT ANTECUBITAL  Final   Special Requests   Final    BOTTLES DRAWN AEROBIC AND ANAEROBIC Blood Culture adequate volume   Culture   Final    NO GROWTH 2 DAYS Performed at [facility], [address], [place], [place] [address]    Report Status PENDING  Incomplete  SARS Coronavirus 2 (CEPHEID- Performed in [place] hospital lab), Hosp Order     Status: None   Collection Time: 05/24/19  1:27 PM   Specimen: Nasopharyngeal Swab  Result Value Ref Range Status   SARS Coronavirus 2 NEGATIVE NEGATIVE Final    Comment: (NOTE) If result is NEGATIVE SARS-CoV-2 target nucleic acids are NOT DETECTED. The SARS-CoV-2 RNA is generally detectable in upper and lower  respiratory specimens during the acute phase of infection. The lowest  concentration of SARS-CoV-2 viral copies this assay can detect is 250  copies / mL. A negative result does not preclude SARS-CoV-2 infection  and should not be used as the sole basis for treatment or other  patient management decisions.  A negative result may occur with  improper specimen collection / handling, submission of specimen other  than nasopharyngeal swab, presence of viral mutation(s) within the  areas targeted by this assay, and inadequate number of viral copies  (<250 copies / mL). A negative result must be combined with clinical  observations, patient history, and epidemiological information. If result is POSITIVE SARS-CoV-2 target nucleic acids are DETECTED. The SARS-CoV-2 RNA is generally detectable in upper and lower  respiratory specimens dur ing the acute phase of infection.  Positive  results are indicative of active infection with SARS-CoV-2.  Clinical  correlation with patient history and other diagnostic information is  necessary to determine patient infection status.  Positive results do  not rule out bacterial infection or co-infection with  other viruses. If result is PRESUMPTIVE POSTIVE SARS-CoV-2 nucleic acids MAY BE PRESENT.   A presumptive positive result was obtained on the submitted specimen  and confirmed on repeat testing.  While 2019 novel coronavirus  (SARS-CoV-2) nucleic acids may be present in the submitted sample  additional confirmatory testing may be necessary for epidemiological  and / or clinical management purposes  to differentiate between  SARS-CoV-2 and other Sarbecovirus currently known to infect humans.  If clinically indicated additional testing with an alternate test  methodology [phone]) is advised. The SARS-CoV-2 RNA is generally  detectable in upper and lower respiratory sp ecimens during the acute  phase of infection. The expected result is Negative. Fact Sheet for Patients:  StrictlyIdeas.no Fact Sheet for Healthcare Providers: BankingDealers.co.za This test is not yet approved or cleared by the [place] FDA and has been authorized for detection and/or diagnosis of SARS-CoV-2 by FDA under an Emergency Use Authorization (EUA).  This EUA will remain in effect (meaning this test can be used) for the duration of the COVID-19 declaration under Section 564(b)(1) of the Act, 21 U.S.C. section 360bbb-3(b)(1), unless the authorization is terminated or revoked sooner. Performed at [facility], [place] [address] [place], [place] [address]   Culture, blood (routine x 2)     Status: None (Preliminary result)   Collection Time: 05/24/19  1:35 PM   Specimen: BLOOD  Result Value Ref Range Status   Specimen Description BLOOD RIGHT ANTECUBITAL  Final   Special Requests   Final    BOTTLES DRAWN AEROBIC AND ANAEROBIC Blood Culture results may not be optimal due to an inadequate volume of blood received in culture bottles   Culture   Final    NO GROWTH 2 DAYS Performed at [facility], [place] [address] [place],  [address]    Report  Status PENDING  Incomplete  Urine culture     Status: None   Collection Time: 05/24/19  2:34 PM   Specimen: Urine,  Random  Result Value Ref Range Status   Specimen Description URINE, RANDOM  Final   Special Requests NONE  Final   Culture   Final    NO GROWTH Performed at [facility], [address] [place], [place] [address]    Report Status 05/25/2019 FINAL  Final    Janine Ores, MD [place] for Infectious Disease [facility] [email or web address] 05/27/2019, 12:26 PM

## 2019-05-27 NOTE — Progress Notes (Signed)
PROGRESS NOTE    Carol Mueller  LZJ:[id number] DOB: 1961/08/15 DOA: 05/24/2019 PCP: Shawnee Knapp, MD   Brief Narrative:  Carol Mueller is a 58 year old female with a past medical history of unilateral adrenalectomy for Cushing's syndrome, type 2 diabetes mellitus on insulin, hypertension, hyperlipidemia and diverticulosis who presented to ED on 05/24/2019 with complaint of fever, chills body aches nausea for past 4 days.  No shortness of breath, cough or rash or dysuria or any urinary complaints.  No bowel complaints either.  Per patient, she was having high-grade fever at home up to 103.  Upon arrival to the emergency department, she was febrile with 102.2.  Slightly tachypneic with 28 of respiratory rate and heart rate of 102.  She was also having leukocytosis.  She was given IV cefepime, Flagyl and vancomycin in the ED and was admitted under hospitalist service with working diagnosis of sepsis of unknown source.  She was COVID-19 negative.  She also had CT angiogram of the chest and was ruled out of PE however she was found to have incidental finding of thrombus in the distal inferior mesenteric vein for which vascular surgery was consulted through the emergency department.  Per vascular surgery recommendation, patient does not need any systemic anticoagulation for this.  Consultants:   Vascular surgery  Procedures:   None  Antimicrobials:   IV Rocephin and Flagyl started 05/24/2019.   Subjective: Patient seen and examined.  She was sitting at the bedside and eating breakfast.  She tells me that she was feeling much better earlier before she started eating but now she is once again feeling body aches.  She also now tells me that she had started having diarrhea and had 4-5 episodes yesterday and also had 4-5 episodes today.  No other complaint.  Objective: Vitals:   05/26/19 1137 05/26/19 1515 05/26/19 2130 05/27/19 0542  BP:  (!) 156/82 (!) 153/93 (!) 154/100  Pulse:  (!) 106 (!) 103  98  Resp:  16 18 16   Temp: 100 F (37.8 C) 100 F (37.8 C) 99.5 F (37.5 C) 98.4 F (36.9 C)  TempSrc: Oral Oral Oral Oral  SpO2:  93% 97% 96%  Weight:      Height:        Intake/Output Summary (Last 24 hours) at 05/27/2019 0836 Last data filed at 05/27/2019 0322 Gross per 24 hour  Intake 2966.25 ml  Output -  Net 2966.25 ml   Filed Weights   05/24/19 1515 05/25/19 0609  Weight: 112.9 kg 117.9 kg    Examination: General exam: Appears calm and comfortable  Respiratory system: Clear to auscultation. Respiratory effort normal. Cardiovascular system: S1 & S2 heard, RRR. No JVD, murmurs, rubs, gallops or clicks. No pedal edema. Gastrointestinal system: Abdomen is nondistended, soft and left lower quadrant minimal tenderness, no organomegaly or masses felt. Normal bowel sounds heard. Central nervous system: Alert and oriented. No focal neurological deficits. Extremities: Symmetric 5 x 5 power. Skin: No rashes, lesions or ulcers Psychiatry: Judgement and insight appear normal. Mood & affect appropriate.    Data Reviewed: I have personally reviewed following labs and imaging studies  CBC: Recent Labs  Lab 05/24/19 1327 05/25/19 0244 05/26/19 0907 05/27/19 0514  WBC 13.1* 12.4* 13.6* 14.1*  NEUTROABS 9.1*  --  8.5* 8.8*  HGB 13.7 12.3 12.2 11.9*  HCT 43.3 38.8 39.5 38.5  MCV 69.5* 69.8* 70.7* 70.6*  PLT 263 256 282 024   Basic Metabolic Panel: Recent Labs  Lab 05/24/19  1327 05/25/19 0244 05/25/19 0617 05/26/19 0230 05/27/19 0327  NA 132* 135  --  138 140  K 3.8 3.5  --  3.7 3.7  CL 93* 100  --  104 107  CO2 24 23  --  24 22  GLUCOSE 175* 105*  --  156* 169*  BUN 18 12  --  7 6  CREATININE 1.14* 1.03*  --  0.88 0.79  CALCIUM 9.5 8.3*  --  8.0* 7.9*  MG  --   --  1.8  --   --    GFR: Estimated Creatinine Clearance: 95.1 mL/min (by C-G formula based on SCr of 0.79 mg/dL). Liver Function Tests: Recent Labs  Lab 05/24/19 1327 05/25/19 0244 05/26/19 0230  05/27/19 0327  AST 75* 54* 74* 45*  ALT 88* 71* 81* 67*  ALKPHOS 66 59 66 71  BILITOT 0.8 0.8 0.7 0.8  PROT 8.2* 6.9 6.8 6.9  ALBUMIN 3.7 3.1* 2.9* 2.8*   Recent Labs  Lab 05/24/19 1327  LIPASE 40   No results for input(s): AMMONIA in the last 168 hours. Coagulation Profile: No results for input(s): INR, PROTIME in the last 168 hours. Cardiac Enzymes: No results for input(s): CKTOTAL, CKMB, CKMBINDEX, TROPONINI in the last 168 hours. BNP (last 3 results) No results for input(s): PROBNP in the last 8760 hours. HbA1C: Recent Labs    05/24/19 1327 05/25/19 1801  HGBA1C 9.5* 9.1*   CBG: Recent Labs  Lab 05/26/19 0758 05/26/19 1225 05/26/19 1638 05/26/19 2123 05/27/19 0808  GLUCAP 155* 148* 154* 174* 135*   Lipid Profile: No results for input(s): CHOL, HDL, LDLCALC, TRIG, CHOLHDL, LDLDIRECT in the last 72 hours. Thyroid Function Tests: No results for input(s): TSH, T4TOTAL, FREET4, T3FREE, THYROIDAB in the last 72 hours. Anemia Panel: No results for input(s): VITAMINB12, FOLATE, FERRITIN, TIBC, IRON, RETICCTPCT in the last 72 hours. Sepsis Labs: Recent Labs  Lab 05/24/19 1327 05/24/19 2304 05/25/19 0244 05/25/19 0617 05/26/19 0907  PROCALCITON 0.47  --  0.38  --  0.19  LATICACIDVEN 1.8 1.1 1.0 0.8  --     Recent Results (from the past 240 hour(s))  Culture, blood (routine x 2)     Status: None (Preliminary result)   Collection Time: 05/24/19  1:26 PM   Specimen: BLOOD  Result Value Ref Range Status   Specimen Description BLOOD LEFT ANTECUBITAL  Final   Special Requests   Final    BOTTLES DRAWN AEROBIC AND ANAEROBIC Blood Culture adequate volume   Culture   Final    NO GROWTH 2 DAYS Performed at [facility], [place] [address] [place], [place] [address]    Report Status PENDING  Incomplete  SARS Coronavirus 2 (CEPHEID- Performed in [place] hospital lab), Hosp Order     Status: None   Collection Time: 05/24/19  1:27 PM   Specimen: Nasopharyngeal  Swab  Result Value Ref Range Status   SARS Coronavirus 2 NEGATIVE NEGATIVE Final    Comment: (NOTE) If result is NEGATIVE SARS-CoV-2 target nucleic acids are NOT DETECTED. The SARS-CoV-2 RNA is generally detectable in upper and lower  respiratory specimens during the acute phase of infection. The lowest  concentration of SARS-CoV-2 viral copies this assay can detect is 250  copies / mL. A negative result does not preclude SARS-CoV-2 infection  and should not be used as the sole basis for treatment or other  patient management decisions.  A negative result may occur with  improper specimen collection / handling,  submission of specimen other  than nasopharyngeal swab, presence of viral mutation(s) within the  areas targeted by this assay, and inadequate number of viral copies  (<250 copies / mL). A negative result must be combined with clinical  observations, patient history, and epidemiological information. If result is POSITIVE SARS-CoV-2 target nucleic acids are DETECTED. The SARS-CoV-2 RNA is generally detectable in upper and lower  respiratory specimens dur ing the acute phase of infection.  Positive  results are indicative of active infection with SARS-CoV-2.  Clinical  correlation with patient history and other diagnostic information is  necessary to determine patient infection status.  Positive results do  not rule out bacterial infection or co-infection with other viruses. If result is PRESUMPTIVE POSTIVE SARS-CoV-2 nucleic acids MAY BE PRESENT.   A presumptive positive result was obtained on the submitted specimen  and confirmed on repeat testing.  While 2019 novel coronavirus  (SARS-CoV-2) nucleic acids may be present in the submitted sample  additional confirmatory testing may be necessary for epidemiological  and / or clinical management purposes  to differentiate between  SARS-CoV-2 and other Sarbecovirus currently known to infect humans.  If clinically indicated  additional testing with an alternate test  methodology [phone]) is advised. The SARS-CoV-2 RNA is generally  detectable in upper and lower respiratory sp ecimens during the acute  phase of infection. The expected result is Negative. Fact Sheet for Patients:  StrictlyIdeas.no Fact Sheet for Healthcare Providers: BankingDealers.co.za This test is not yet approved or cleared by the [place] FDA and has been authorized for detection and/or diagnosis of SARS-CoV-2 by FDA under an Emergency Use Authorization (EUA).  This EUA will remain in effect (meaning this test can be used) for the duration of the COVID-19 declaration under Section 564(b)(1) of the Act, 21 U.S.C. section 360bbb-3(b)(1), unless the authorization is terminated or revoked sooner. Performed at [facility], [place] [address], [place], [place] [address]   Culture, blood (routine x 2)     Status: None (Preliminary result)   Collection Time: 05/24/19  1:35 PM   Specimen: BLOOD  Result Value Ref Range Status   Specimen Description BLOOD RIGHT ANTECUBITAL  Final   Special Requests   Final    BOTTLES DRAWN AEROBIC AND ANAEROBIC Blood Culture results may not be optimal due to an inadequate volume of blood received in culture bottles   Culture   Final    NO GROWTH 2 DAYS Performed at [facility], [place] [address] [place], [place] [address]    Report Status PENDING  Incomplete  Urine culture     Status: None   Collection Time: 05/24/19  2:34 PM   Specimen: Urine, Random  Result Value Ref Range Status   Specimen Description URINE, RANDOM  Final   Special Requests NONE  Final   Culture   Final    NO GROWTH Performed at [facility], [place] [address], [place], [place] [address]    Report Status 05/25/2019 FINAL  Final      Radiology Studies: No results found.  Scheduled Meds: . atorvastatin  10 mg Oral Daily  . carvedilol  3.125 mg Oral BID WC  .  heparin  5,000 Units Subcutaneous Q8H  . insulin aspart  0-15 Units Subcutaneous TID WC  . insulin aspart  0-5 Units Subcutaneous QHS  . insulin glargine  15 Units Subcutaneous Daily  . irbesartan  300 mg Oral Daily  . promethazine  12.5 mg Intravenous Once  Continuous Infusions: . sodium chloride 125 mL/hr at 05/27/19 0635  . cefTRIAXone (ROCEPHIN)  IV Stopped (05/26/19 2317)  . metronidazole 500 mg (05/27/19 0637)     LOS: 3 days   Assessment & Plan:   Principal Problem:   Sepsis ([place]) Active Problems:   Essential hypertension   Abdominal pain   Insulin-requiring or dependent type II diabetes mellitus ([place])   Mesenteric vein thrombosis   SIRS / ?SEPSIS /nausea vomiting: She meets sirs criteria since there is no source of infection so far however she does have elevated procalcitonin indicating that she is brewing infection somewhere However her procalcitonin is improving with antibiotics..  Blood cultures have been drawn and are negative so far.  Chest x-ray negative and UA negative as well.  CT abdomen negative for any acute pathology.  She has been having intermittent fever since admission but once again, she had CT abdomen pelvis with contrast with no acute pathology seen.  She does not have any specific symptoms for me to pursue any organ system infection except today she mentioned having diarrhea since yesterday and she had 4-5 episodes yesterday and today so I will order C. difficile testing.  Continue Rocephin and follow culture and tailor antibiotics accordingly.  I have consulted ID for their recommendations.  Type 2 diabetes mellitus: Blood sugar much better on half dose of Lantus which is 15 units along with SSI.  Continue current management.  Essential hypertension: Blood pressure controlled.  Continue olmesartan and carvedilol but hold hydrochlorothiazide for now.  Bigeminy on telemetry: Reportedly, she had few episodes of bigeminy 2 days ago and again this morning  without having any ACS symptoms.  Continue to monitor on telemetry.  If frequent and persistent, consider cardiology consult.  Chronic kidney disease stage III: At baseline.  Continue to watch.  DVT prophylaxis: Lovenox Code Status: Full code Family Communication: None present at bedside.  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  Discussed plan of care with the patient in detail.  Answered all the questions she had. Disposition Plan: To be determined   Time spent: 28 minutes   Darliss Cheney, MD [facility] Pager [phone]  If 7PM-7AM, please contact night-coverage [email or web address] Password [facility] 05/27/2019, 8:36 AM

## 2019-05-28 DIAGNOSIS — R509 Fever, unspecified: Secondary | ICD-10-CM

## 2019-05-28 DIAGNOSIS — Z8719 Personal history of other diseases of the digestive system: Secondary | ICD-10-CM

## 2019-05-28 DIAGNOSIS — E896 Postprocedural adrenocortical (-medullary) hypofunction: Secondary | ICD-10-CM

## 2019-05-28 DIAGNOSIS — Z888 Allergy status to other drugs, medicaments and biological substances status: Secondary | ICD-10-CM

## 2019-05-28 DIAGNOSIS — Z86018 Personal history of other benign neoplasm: Secondary | ICD-10-CM

## 2019-05-28 DIAGNOSIS — R197 Diarrhea, unspecified: Secondary | ICD-10-CM

## 2019-05-28 DIAGNOSIS — E119 Type 2 diabetes mellitus without complications: Secondary | ICD-10-CM

## 2019-05-28 DIAGNOSIS — R1032 Left lower quadrant pain: Secondary | ICD-10-CM

## 2019-05-28 DIAGNOSIS — D72829 Elevated white blood cell count, unspecified: Secondary | ICD-10-CM

## 2019-05-28 DIAGNOSIS — Z886 Allergy status to analgesic agent status: Secondary | ICD-10-CM

## 2019-05-28 DIAGNOSIS — K55059 Acute (reversible) ischemia of intestine, part and extent unspecified: Secondary | ICD-10-CM

## 2019-05-28 DIAGNOSIS — A020 Salmonella enteritis: Secondary | ICD-10-CM | POA: Diagnosis present

## 2019-05-28 LAB — COMPREHENSIVE METABOLIC PANEL
ALT: 59 U/L — ABNORMAL HIGH (ref 0–44)
AST: 46 U/L — ABNORMAL HIGH (ref 15–41)
Albumin: 2.7 g/dL — ABNORMAL LOW (ref 3.5–5.0)
Alkaline Phosphatase: 65 U/L (ref 38–126)
Anion gap: 10 (ref 5–15)
BUN: 5 mg/dL — ABNORMAL LOW (ref 6–20)
CO2: 20 mmol/L — ABNORMAL LOW (ref 22–32)
Calcium: 7.6 mg/dL — ABNORMAL LOW (ref 8.9–10.3)
Chloride: 108 mmol/L (ref 98–111)
Creatinine, Ser: 0.67 mg/dL (ref 0.44–1.00)
GFR calc Af Amer: >60 mL/min (ref >60)
GFR calc non Af Amer: >60 mL/min (ref >60)
Glucose, Bld: 230 mg/dL — ABNORMAL HIGH (ref 70–99)
Potassium: 3.3 mmol/L — ABNORMAL LOW (ref 3.5–5.1)
Sodium: 138 mmol/L (ref 135–145)
Total Bilirubin: 0.6 mg/dL (ref 0.3–1.2)
Total Protein: 6.8 g/dL (ref 6.5–8.1)

## 2019-05-28 LAB — GASTROINTESTINAL PANEL BY PCR, STOOL (REPLACES STOOL CULTURE)

## 2019-05-28 LAB — CBC WITH DIFFERENTIAL/PLATELET
Abs Immature Granulocytes: 0.29 10*3/uL — ABNORMAL HIGH (ref 0.00–0.07)
Basophils Absolute: 0.1 10*3/uL (ref 0.0–0.1)
Basophils Relative: 1 %
Eosinophils Absolute: 0.4 10*3/uL (ref 0.0–0.5)
Eosinophils Relative: 3 %
HCT: 38.5 % (ref 36.0–46.0)
Hemoglobin: 12.2 g/dL (ref 12.0–15.0)
Immature Granulocytes: 3 %
Lymphocytes Relative: 26 %
Lymphs Abs: 3 10*3/uL (ref 0.7–4.0)
MCH: 22 pg — ABNORMAL LOW (ref 26.0–34.0)
MCHC: 31.7 g/dL (ref 30.0–36.0)
MCV: 69.5 fL — ABNORMAL LOW (ref 80.0–100.0)
Monocytes Absolute: 1 10*3/uL (ref 0.1–1.0)
Monocytes Relative: 9 %
Neutro Abs: 6.7 10*3/uL (ref 1.7–7.7)
Neutrophils Relative %: 58 %
Platelets: 403 10*3/uL — ABNORMAL HIGH (ref 150–400)
RBC: 5.54 MIL/uL — ABNORMAL HIGH (ref 3.87–5.11)
RDW: 17.6 % — ABNORMAL HIGH (ref 11.5–15.5)
WBC: 11.5 10*3/uL — ABNORMAL HIGH (ref 4.0–10.5)
nRBC: 0 % (ref 0.0–0.2)

## 2019-05-28 LAB — GLUCOSE, CAPILLARY
Glucose-Capillary: 190 mg/dL — ABNORMAL HIGH (ref 70–99)
Glucose-Capillary: 153 mg/dL — ABNORMAL HIGH (ref 70–99)
Glucose-Capillary: 153 mg/dL — ABNORMAL HIGH (ref 70–99)
Glucose-Capillary: 192 mg/dL — ABNORMAL HIGH (ref 70–99)

## 2019-05-28 LAB — CBC
HCT: 37.2 % (ref 36.0–46.0)
Hemoglobin: 11.8 g/dL — ABNORMAL LOW (ref 12.0–15.0)
MCH: 22 pg — ABNORMAL LOW (ref 26.0–34.0)
MCHC: 31.7 g/dL (ref 30.0–36.0)
MCV: 69.4 fL — ABNORMAL LOW (ref 80.0–100.0)
Platelets: 439 10*3/uL — ABNORMAL HIGH (ref 150–400)
RBC: 5.36 MIL/uL — ABNORMAL HIGH (ref 3.87–5.11)
RDW: 17.1 % — ABNORMAL HIGH (ref 11.5–15.5)
WBC: 11.6 10*3/uL — ABNORMAL HIGH (ref 4.0–10.5)
nRBC: 0.2 % (ref 0.0–0.2)

## 2019-05-28 MED ORDER — CIPROFLOXACIN HCL 500 MG PO TABS: 500.0000 mg | ORAL_TABLET | Freq: Two times a day (BID) | ORAL | 0 refills | Status: AC

## 2019-05-28 MED ORDER — POTASSIUM CHLORIDE CRYS ER 20 MEQ PO TBCR
40.0000 meq | EXTENDED_RELEASE_TABLET | Freq: Once | ORAL | Status: AC
  Administered 2019-05-28: 13:00:00 40 meq via ORAL
  Filled 2019-05-28: qty 2

## 2019-05-28 MED ORDER — HYDROCHLOROTHIAZIDE 25 MG PO TABS
25.0000 mg | ORAL_TABLET | Freq: Every day | ORAL | Status: DC
  Administered 2019-05-28 – 2019-05-29 (×2): 25 mg via ORAL
  Filled 2019-05-28 (×2): qty 1

## 2019-05-28 MED ORDER — HYDRALAZINE HCL 20 MG/ML IJ SOLN
10.0000 mg | Freq: Four times a day (QID) | INTRAMUSCULAR | Status: DC | PRN
Start: 2019-05-28 — End: 2019-05-29
  Administered 2019-05-28 – 2019-05-29 (×2): 10 mg via INTRAVENOUS
  Filled 2019-05-28 (×2): qty 1

## 2019-05-28 MED ORDER — CIPROFLOXACIN IN D5W 400 MG/200ML IV SOLN
400.0000 mg | Freq: Two times a day (BID) | INTRAVENOUS | Status: DC
  Administered 2019-05-28 – 2019-05-29 (×3): 400 mg via INTRAVENOUS
  Filled 2019-05-28 (×3): qty 200

## 2019-05-28 NOTE — Discharge Summary (Signed)
Physician Discharge Summary  INETTE DOUBRAVA AOZ:[id number] DOB: August 13, 1961 DOA: 05/24/2019  PCP: Shawnee Knapp, MD  Admit date: 05/24/2019 Discharge date: 05/28/2019  Admitted From: Home Disposition: Home  Recommendations for Outpatient Follow-up:  1. Follow up with PCP in 1-2 weeks 2. Please obtain BMP/CBC in one week 3. Please follow up on the following pending results:  Home Health: No Equipment/Devices: None  Discharge Condition: Stable CODE STATUS: Full code Diet recommendation: Cardiac  Subjective: Patient seen and examined.  She had no other complaint other than just generalized weakness.  No more abdominal pain.  No more nausea or vomiting.  Tolerating diet.  No more diarrhea.  Brief/Interim Summary: [name]. Hewes is a 58 year old female with a past medical history of unilateral adrenalectomy for Cushing's syndrome, type 2 diabetes mellitus on insulin, hypertension, hyperlipidemia and diverticulosis who presented to ED on 05/24/2019 with complaint of fever, chills body aches nausea for past 4 days.  No shortness of breath, cough or rash or dysuria or any urinary complaints.  No bowel complaints either.  Per patient, she was having high-grade fever at home up to 103.  Upon arrival to the emergency department, she was febrile with 102.2.  Slightly tachypneic with 28 of respiratory rate and heart rate of 102.  She was also having leukocytosis.  She was given IV cefepime, Flagyl and vancomycin in the ED and was admitted under hospitalist service with working diagnosis of sepsis of unknown source.  She was COVID-19 negative.  She also had CT angiogram of the chest and was ruled out of PE however she was found to have incidental finding of thrombus in the distal inferior mesenteric vein for which vascular surgery was consulted through the emergency department.  Per vascular surgery recommendation, patient does not need any systemic anticoagulation for this.  After admission, she continued to have  persistent fever which was mostly low-grade so ID was consulted and she was seen by Dr. Prince Rome.  Dr. Prince Rome had seen the patient on 05/27/2019.  Please note that there is no documented note from him which he said he will document soon however I called him over the phone this morning and he told me that his working diagnosis is Salmonella enteritis especially that she has incidental finding of mesenteric vein thrombosis and that he recommends ciprofloxacin for 10 days..  She has been ruled out of C. Difficile.  Patient's leukocytosis is improving and she has remained afebrile for 24 hours so she will be discharged on ciprofloxacin for 10 days that she will follow with PCP.  Discharge Diagnoses:  Principal Problem:   Sepsis ([place]) Active Problems:   Essential hypertension   Abdominal pain   Insulin-requiring or dependent type II diabetes mellitus ([place])   Mesenteric vein thrombosis   Salmonella enteritis    Discharge Instructions  Discharge Instructions    Discharge patient   Complete by: As directed    Discharge disposition: 01-Home or Self Care   Discharge patient date: 05/28/2019     Allergies as of 05/28/2019      Reactions   Norvasc [amlodipine Besylate] Swelling   ANGIOEDEMA   Nsaids Other (See Comments)   GI upset/ GI bleed      Medication List    TAKE these medications   ADVANCED COLLAGEN PO Take 1 scoop by mouth once a week.   atorvastatin 10 MG tablet Commonly known as: LIPITOR Take 10 mg by mouth daily.   Basaglar KwikPen 100 UNIT/ML Sopn Inject 30 Units into  the skin daily.   carvedilol 3.125 MG tablet Commonly known as: COREG Take 3.125 mg by mouth 2 (two) times daily with a meal.   ciprofloxacin 500 MG tablet Commonly known as: Cipro Take 1 tablet (500 mg total) by mouth 2 (two) times daily for 10 days.   dicyclomine 20 MG tablet Commonly known as: BENTYL TAKE 1 TABLET BY MOUTH TWICE A DAY What changed:   when to take this  reasons to take this    hydrochlorothiazide 25 MG tablet Commonly known as: HYDRODIURIL Take 25 mg by mouth daily.   Jardiance 10 MG Tabs tablet Generic drug: empagliflozin Take 10 mg by mouth daily.   Lutein-Zeaxanthin 20-1 MG Caps Take 1 capsule by mouth daily.   metFORMIN 1000 MG tablet Commonly known as: GLUCOPHAGE Take 1 tablet (1,000 mg total) by mouth 2 (two) times daily with a meal. PATIENT NEEDS OFFICE VISIT FOR ADDITIONAL REFILLS What changed: additional instructions   multivitamin with minerals tablet Take 1 tablet by mouth daily.   olmesartan 40 MG tablet Commonly known as: BENICAR Take 40 mg by mouth daily.   Omega 3 1000 MG Caps Take 1,000 mg by mouth daily.      Follow-up Information    Schedule an appointment as soon as possible for a visit  with Shawnee Knapp, MD.   Specialty: [facility] Medicine Contact information: [place] [address] [phone]        Go to  [facility].   Specialty: Emergency Medicine Why: As needed, If symptoms worsen Contact information: [address] [id number] [place] [address] [phone]         Allergies  Allergen Reactions  . Norvasc [Amlodipine Besylate] Swelling    ANGIOEDEMA  . Nsaids Other (See Comments)    GI upset/ GI bleed    Consultations: ID and vascular surgery   Procedures/Studies: Ct Angio Chest Pe W And/or Wo Contrast  Result Date: 05/24/2019 CLINICAL DATA:  Fever and body aches since Sunday. EXAM: CT ANGIOGRAPHY CHEST CT ABDOMEN AND PELVIS WITH CONTRAST TECHNIQUE: Multidetector CT imaging of the chest was performed using the standard protocol during bolus administration of intravenous contrast. Multiplanar CT image reconstructions and MIPs were obtained to evaluate the vascular anatomy. Multidetector CT imaging of the abdomen and pelvis was performed using the standard protocol during bolus administration of intravenous contrast.  CONTRAST:  123mL OMNIPAQUE IOHEXOL 350 MG/ML SOLN COMPARISON:  Chest x-ray from same day. CT abdomen pelvis dated June 02, 2018. FINDINGS: CTA CHEST FINDINGS Cardiovascular: Satisfactory opacification of the pulmonary arteries to the segmental level. No evidence of pulmonary embolism. Normal heart size. No pericardial effusion. No thoracic aortic aneurysm or dissection. Mediastinum/Nodes: No enlarged mediastinal, hilar, or axillary lymph nodes. Thyroid gland, trachea, and esophagus demonstrate no significant findings. Lungs/Pleura: No focal consolidation, pleural effusion, or pneumothorax. Two 4 mm nodules in the right upper lobe (series 11, images 26 and 33). Musculoskeletal: No chest wall abnormality. No acute or significant osseous findings. Review of the MIP images confirms the above findings. CT ABDOMEN AND PELVIS FINDINGS Hepatobiliary: Unchanged diffuse hepatic steatosis. No focal liver abnormality. Prior cholecystectomy. No biliary dilatation. Pancreas: Unremarkable. No pancreatic ductal dilatation or surrounding inflammatory changes. Spleen: Normal in size without focal abnormality. Adrenals/Urinary Tract: Prior right adrenalectomy. The left adrenal gland is unremarkable. Unchanged bilateral renal simple cysts. No renal calculi or hydronephrosis. The bladder is unremarkable. Stomach/Bowel: Stomach is within normal limits. Appendix appears normal. No evidence of  bowel wall thickening, distention, or inflammatory changes. Extensive colonic diverticulosis again noted. 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 No enlarged abdominal or pelvic lymph nodes. Reproductive: Multiple calcified uterine fibroids again noted. No adnexal mass. Other: Unchanged tiny fat containing right inguinal and umbilical hernias. No free fluid or pneumoperitoneum. Musculoskeletal:  No acute or significant osseous findings. Review of the MIP images confirms the above findings. IMPRESSION: Chest: 1. No evidence of pulmonary embolism. No acute intrathoracic process. 2. Two 4 mm pulmonary nodules in the right upper lobe. No follow-up needed if patient is low-risk (and has no known or suspected primary neoplasm). Non-contrast chest CT can be considered in 12 months if patient is high-risk. This recommendation follows the consensus statement: Guidelines for Management of Incidental Pulmonary Nodules Detected on CT Images: From the Fleischner Society 2017; Radiology 2017; 284:228-243. Abdomen and pelvis: 1. 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 2. Extensive colonic diverticulosis again noted. No evidence of acute diverticulitis. 3. Unchanged hepatic steatosis. 4. Unchanged enlarged fibroid uterus. Electronically Signed   By: Titus Dubin M.D.   On: 05/24/2019 16:47   Ct Abdomen Pelvis W Contrast  Result Date: 05/24/2019 CLINICAL DATA:  Fever and body aches since Sunday. EXAM: CT ANGIOGRAPHY CHEST CT ABDOMEN AND PELVIS WITH CONTRAST TECHNIQUE: Multidetector CT imaging of the chest was performed using the standard protocol during bolus administration of intravenous contrast. Multiplanar CT image reconstructions and MIPs were obtained to evaluate the vascular anatomy. Multidetector CT imaging of the abdomen and pelvis was performed using the standard protocol during bolus administration of intravenous contrast. CONTRAST:  139mL OMNIPAQUE IOHEXOL 350 MG/ML SOLN COMPARISON:  Chest x-ray from same day. CT abdomen pelvis dated June 02, 2018. FINDINGS: CTA CHEST FINDINGS Cardiovascular: Satisfactory opacification of the pulmonary arteries to the segmental level. No evidence of pulmonary embolism. Normal heart size. No pericardial effusion. No thoracic aortic aneurysm or dissection. Mediastinum/Nodes: No  enlarged mediastinal, hilar, or axillary lymph nodes. Thyroid gland, trachea, and esophagus demonstrate no significant findings. Lungs/Pleura: No focal consolidation, pleural effusion, or pneumothorax. Two 4 mm nodules in the right upper lobe (series 11, images 26 and 33). Musculoskeletal: No chest wall abnormality. No acute or significant osseous findings. Review of the MIP images confirms the above findings. CT ABDOMEN AND PELVIS FINDINGS Hepatobiliary: Unchanged diffuse hepatic steatosis. No focal liver abnormality. Prior cholecystectomy. No biliary dilatation. Pancreas: Unremarkable. No pancreatic ductal dilatation or surrounding inflammatory changes. Spleen: Normal in size without focal abnormality. Adrenals/Urinary Tract: Prior right adrenalectomy. The left adrenal gland is unremarkable. Unchanged bilateral renal simple cysts. No renal calculi or hydronephrosis. The bladder is unremarkable. Stomach/Bowel: Stomach is within normal limits. Appendix appears normal. No evidence of bowel wall thickening, distention, or inflammatory changes. Extensive colonic diverticulosis again noted. 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 No enlarged abdominal or pelvic lymph nodes. Reproductive: Multiple calcified uterine fibroids again noted. No adnexal mass. Other: Unchanged tiny fat containing right inguinal and umbilical hernias. No free fluid or pneumoperitoneum. Musculoskeletal: No acute or significant osseous findings. Review of the MIP images confirms the above findings. IMPRESSION: Chest: 1. No evidence of pulmonary embolism. No acute intrathoracic process. 2. Two 4 mm pulmonary nodules in the right upper lobe. No follow-up  needed if patient is low-risk (and has no known or suspected primary neoplasm). Non-contrast chest CT can be considered in 12 months if  patient is high-risk. This recommendation follows the consensus statement: Guidelines for Management of Incidental Pulmonary Nodules Detected on CT Images: From the Fleischner Society 2017; Radiology 2017; 284:228-243. Abdomen and pelvis: 1. 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 2. Extensive colonic diverticulosis again noted. No evidence of acute diverticulitis. 3. Unchanged hepatic steatosis. 4. Unchanged enlarged fibroid uterus. Electronically Signed   By: Titus Dubin M.D.   On: 05/24/2019 16:47   Dg Chest Port 1 View  Result Date: 05/24/2019 CLINICAL DATA:  Fever and body aches. EXAM: PORTABLE CHEST 1 VIEW COMPARISON:  None. FINDINGS: The heart size and mediastinal contours are within normal limits. Both lungs are clear. The visualized skeletal structures are unremarkable. IMPRESSION: No active cardiopulmonary disease. Electronically Signed   By: Abelardo Diesel M.D.   On: 05/24/2019 13:37      Discharge Exam: Vitals:   05/28/19 0554 05/28/19 1111  BP: (!) 153/110 (!) 155/87  Pulse: 100 86  Resp: 18   Temp: 100 F (37.8 C)   SpO2: 96%    Vitals:   05/27/19 2135 05/28/19 0500 05/28/19 0554 05/28/19 1111  BP: (!) 162/97 (!) 153/110 (!) 153/110 (!) 155/87  Pulse: 94 100 100 86  Resp: (!) 27 18 18    Temp: 99.1 F (37.3 C) 100 F (37.8 C) 100 F (37.8 C)   TempSrc: Oral Oral    SpO2: 95% 96% 96%   Weight:      Height:        General: Pt is alert, awake, not in acute distress Cardiovascular: RRR, S1/S2 +, no rubs, no gallops Respiratory: CTA bilaterally, no wheezing, no rhonchi Abdominal: Soft, NT, ND, bowel sounds + Extremities: no edema, no cyanosis    The results of significant diagnostics from this hospitalization (including imaging, microbiology, ancillary and laboratory) are listed below for reference.     Microbiology: Recent Results (from the past 240 hour(s))  Culture,  blood (routine x 2)     Status: None (Preliminary result)   Collection Time: 05/24/19  1:26 PM   Specimen: BLOOD  Result Value Ref Range Status   Specimen Description BLOOD LEFT ANTECUBITAL  Final   Special Requests   Final    BOTTLES DRAWN AEROBIC AND ANAEROBIC Blood Culture adequate volume   Culture   Final    NO GROWTH 3 DAYS Performed at [facility], [address] [place], [place] [address]    Report Status PENDING  Incomplete  SARS Coronavirus 2 (CEPHEID- Performed in [place] hospital lab), Hosp Order     Status: None   Collection Time: 05/24/19  1:27 PM   Specimen: Nasopharyngeal Swab  Result Value Ref Range Status   SARS Coronavirus 2 NEGATIVE NEGATIVE Final    Comment: (NOTE) If result is NEGATIVE SARS-CoV-2 target nucleic acids are NOT DETECTED. The SARS-CoV-2 RNA is generally detectable in upper and lower  respiratory specimens during the acute phase of infection. The lowest  concentration of SARS-CoV-2 viral copies this assay can detect is 250  copies / mL. A negative result does not preclude SARS-CoV-2 infection  and should not be used as the sole basis for treatment or other  patient management decisions.  A negative result may occur with  improper specimen collection / handling, submission of specimen other  than nasopharyngeal  swab, presence of viral mutation(s) within the  areas targeted by this assay, and inadequate number of viral copies  (<250 copies / mL). A negative result must be combined with clinical  observations, patient history, and epidemiological information. If result is POSITIVE SARS-CoV-2 target nucleic acids are DETECTED. The SARS-CoV-2 RNA is generally detectable in upper and lower  respiratory specimens dur ing the acute phase of infection.  Positive  results are indicative of active infection with SARS-CoV-2.  Clinical  correlation with patient history and other diagnostic information is  necessary to determine patient infection  status.  Positive results do  not rule out bacterial infection or co-infection with other viruses. If result is PRESUMPTIVE POSTIVE SARS-CoV-2 nucleic acids MAY BE PRESENT.   A presumptive positive result was obtained on the submitted specimen  and confirmed on repeat testing.  While 2019 novel coronavirus  (SARS-CoV-2) nucleic acids may be present in the submitted sample  additional confirmatory testing may be necessary for epidemiological  and / or clinical management purposes  to differentiate between  SARS-CoV-2 and other Sarbecovirus currently known to infect humans.  If clinically indicated additional testing with an alternate test  methodology [phone]) is advised. The SARS-CoV-2 RNA is generally  detectable in upper and lower respiratory sp ecimens during the acute  phase of infection. The expected result is Negative. Fact Sheet for Patients:  StrictlyIdeas.no Fact Sheet for Healthcare Providers: BankingDealers.co.za This test is not yet approved or cleared by the [place] FDA and has been authorized for detection and/or diagnosis of SARS-CoV-2 by FDA under an Emergency Use Authorization (EUA).  This EUA will remain in effect (meaning this test can be used) for the duration of the COVID-19 declaration under Section 564(b)(1) of the Act, 21 U.S.C. section 360bbb-3(b)(1), unless the authorization is terminated or revoked sooner. Performed at [facility], [place] [address], [place], [place] [address]   Culture, blood (routine x 2)     Status: None (Preliminary result)   Collection Time: 05/24/19  1:35 PM   Specimen: BLOOD  Result Value Ref Range Status   Specimen Description BLOOD RIGHT ANTECUBITAL  Final   Special Requests   Final    BOTTLES DRAWN AEROBIC AND ANAEROBIC Blood Culture results may not be optimal due to an inadequate volume of blood received in culture bottles   Culture   Final    NO GROWTH 3  DAYS Performed at [facility], [place] [address] [place], [place] [address]    Report Status PENDING  Incomplete  Urine culture     Status: None   Collection Time: 05/24/19  2:34 PM   Specimen: Urine, Random  Result Value Ref Range Status   Specimen Description URINE, RANDOM  Final   Special Requests NONE  Final   Culture   Final    NO GROWTH Performed at [facility], [place] [address] [place], [place] [address]    Report Status 05/25/2019 FINAL  Final  C difficile quick scan w PCR reflex     Status: None   Collection Time: 05/27/19  2:55 PM   Specimen: STOOL  Result Value Ref Range Status   C Diff antigen NEGATIVE NEGATIVE Final   C Diff toxin NEGATIVE NEGATIVE Final   C Diff interpretation No C. difficile detected.  Final    Comment: Performed at [facility], [place] [address], [place], [place] [address]     Labs: BNP (last 3 results) No results for input(s):  BNP in the last 8760 hours. Basic Metabolic Panel: Recent Labs  Lab 05/24/19 1327 05/25/19 0244 05/25/19 0617 05/26/19 0230 05/27/19 0327 05/28/19 0841  NA 132* 135  --  138 140 138  K 3.8 3.5  --  3.7 3.7 3.3*  CL 93* 100  --  104 107 108  CO2 24 23  --  24 22 20*  GLUCOSE 175* 105*  --  156* 169* 230*  BUN 18 12  --  7 6 5*  CREATININE 1.14* 1.03*  --  0.88 0.79 0.67  CALCIUM 9.5 8.3*  --  8.0* 7.9* 7.6*  MG  --   --  1.8  --   --   --    Liver Function Tests: Recent Labs  Lab 05/24/19 1327 05/25/19 0244 05/26/19 0230 05/27/19 0327 05/28/19 0841  AST 75* 54* 74* 45* 46*  ALT 88* 71* 81* 67* 59*  ALKPHOS 66 59 66 71 65  BILITOT 0.8 0.8 0.7 0.8 0.6  PROT 8.2* 6.9 6.8 6.9 6.8  ALBUMIN 3.7 3.1* 2.9* 2.8* 2.7*   Recent Labs  Lab 05/24/19 1327  LIPASE 40   No results for input(s): AMMONIA in the last 168 hours. CBC: Recent Labs  Lab 05/24/19 1327 05/25/19 0244 05/26/19 0907 05/27/19 0514 05/28/19 0841  WBC 13.1* 12.4* 13.6* 14.1* 11.5*  NEUTROABS 9.1*  --  8.5* 8.8* 6.7   HGB 13.7 12.3 12.2 11.9* 12.2  HCT 43.3 38.8 39.5 38.5 38.5  MCV 69.5* 69.8* 70.7* 70.6* 69.5*  PLT 263 256 282 359 403*   Cardiac Enzymes: No results for input(s): CKTOTAL, CKMB, CKMBINDEX, TROPONINI in the last 168 hours. BNP: Invalid input(s): POCBNP CBG: Recent Labs  Lab 05/27/19 0808 05/27/19 1149 05/27/19 1646 05/27/19 2129 05/28/19 0809  GLUCAP 135* 166* 237* 191* 190*   D-Dimer No results for input(s): DDIMER in the last 72 hours. Hgb A1c Recent Labs    05/25/19 1801  HGBA1C 9.1*   Lipid Profile No results for input(s): CHOL, HDL, LDLCALC, TRIG, CHOLHDL, LDLDIRECT in the last 72 hours. Thyroid function studies No results for input(s): TSH, T4TOTAL, T3FREE, THYROIDAB in the last 72 hours.  Invalid input(s): FREET3 Anemia work up No results for input(s): VITAMINB12, FOLATE, FERRITIN, TIBC, IRON, RETICCTPCT in the last 72 hours. Urinalysis    Component Value Date/Time   COLORURINE YELLOW 05/24/2019 1430   APPEARANCEUR HAZY (A) 05/24/2019 1430   LABSPEC 1.020 05/24/2019 1430   PHURINE 5.0 05/24/2019 1430   GLUCOSEU >=500 (A) 05/24/2019 1430   HGBUR NEGATIVE 05/24/2019 1430   BILIRUBINUR NEGATIVE 05/24/2019 1430   BILIRUBINUR neg 08/25/2014 1157   KETONESUR 5 (A) 05/24/2019 1430   PROTEINUR NEGATIVE 05/24/2019 1430   UROBILINOGEN 0.2 08/25/2014 1157   NITRITE NEGATIVE 05/24/2019 1430   LEUKOCYTESUR NEGATIVE 05/24/2019 1430   Sepsis Labs Invalid input(s): PROCALCITONIN,  WBC,  LACTICIDVEN Microbiology Recent Results (from the past 240 hour(s))  Culture, blood (routine x 2)     Status: None (Preliminary result)   Collection Time: 05/24/19  1:26 PM   Specimen: BLOOD  Result Value Ref Range Status   Specimen Description BLOOD LEFT ANTECUBITAL  Final   Special Requests   Final    BOTTLES DRAWN AEROBIC AND ANAEROBIC Blood Culture adequate volume   Culture   Final    NO GROWTH 3 DAYS Performed at [facility], [place] [address] [place], [place]  [address]    Report Status PENDING  Incomplete  SARS Coronavirus 2 (CEPHEID- Performed  in [place] lab), Hosp Order     Status: None   Collection Time: 05/24/19  1:27 PM   Specimen: Nasopharyngeal Swab  Result Value Ref Range Status   SARS Coronavirus 2 NEGATIVE NEGATIVE Final    Comment: (NOTE) If result is NEGATIVE SARS-CoV-2 target nucleic acids are NOT DETECTED. The SARS-CoV-2 RNA is generally detectable in upper and lower  respiratory specimens during the acute phase of infection. The lowest  concentration of SARS-CoV-2 viral copies this assay can detect is 250  copies / mL. A negative result does not preclude SARS-CoV-2 infection  and should not be used as the sole basis for treatment or other  patient management decisions.  A negative result may occur with  improper specimen collection / handling, submission of specimen other  than nasopharyngeal swab, presence of viral mutation(s) within the  areas targeted by this assay, and inadequate number of viral copies  (<250 copies / mL). A negative result must be combined with clinical  observations, patient history, and epidemiological information. If result is POSITIVE SARS-CoV-2 target nucleic acids are DETECTED. The SARS-CoV-2 RNA is generally detectable in upper and lower  respiratory specimens dur ing the acute phase of infection.  Positive  results are indicative of active infection with SARS-CoV-2.  Clinical  correlation with patient history and other diagnostic information is  necessary to determine patient infection status.  Positive results do  not rule out bacterial infection or co-infection with other viruses. If result is PRESUMPTIVE POSTIVE SARS-CoV-2 nucleic acids MAY BE PRESENT.   A presumptive positive result was obtained on the submitted specimen  and confirmed on repeat testing.  While 2019 novel coronavirus  (SARS-CoV-2) nucleic acids may be present in the submitted sample  additional confirmatory  testing may be necessary for epidemiological  and / or clinical management purposes  to differentiate between  SARS-CoV-2 and other Sarbecovirus currently known to infect humans.  If clinically indicated additional testing with an alternate test  methodology [phone]) is advised. The SARS-CoV-2 RNA is generally  detectable in upper and lower respiratory sp ecimens during the acute  phase of infection. The expected result is Negative. Fact Sheet for Patients:  StrictlyIdeas.no Fact Sheet for Healthcare Providers: BankingDealers.co.za This test is not yet approved or cleared by the [place] FDA and has been authorized for detection and/or diagnosis of SARS-CoV-2 by FDA under an Emergency Use Authorization (EUA).  This EUA will remain in effect (meaning this test can be used) for the duration of the COVID-19 declaration under Section 564(b)(1) of the Act, 21 U.S.C. section 360bbb-3(b)(1), unless the authorization is terminated or revoked sooner. Performed at [facility], [place] [address] [address] [place], [place] [address]   Culture, blood (routine x 2)     Status: None (Preliminary result)   Collection Time: 05/24/19  1:35 PM   Specimen: BLOOD  Result Value Ref Range Status   Specimen Description BLOOD RIGHT ANTECUBITAL  Final   Special Requests   Final    BOTTLES DRAWN AEROBIC AND ANAEROBIC Blood Culture results may not be optimal due to an inadequate volume of blood received in culture bottles   Culture   Final    NO GROWTH 3 DAYS Performed at [facility], [place] [address] [place], [place] [address]    Report Status PENDING  Incomplete  Urine culture     Status: None   Collection Time: 05/24/19  2:34 PM   Specimen: Urine, Random  Result Value Ref Range  Status   Specimen Description URINE, RANDOM  Final   Special Requests NONE  Final   Culture   Final    NO GROWTH Performed at [facility], [address],  [place], [place] [address]    Report Status 05/25/2019 FINAL  Final  C difficile quick scan w PCR reflex     Status: None   Collection Time: 05/27/19  2:55 PM   Specimen: STOOL  Result Value Ref Range Status   C Diff antigen NEGATIVE NEGATIVE Final   C Diff toxin NEGATIVE NEGATIVE Final   C Diff interpretation No C. difficile detected.  Final    Comment: Performed at [facility], [place] [address], [place], [place] [address]     Time coordinating discharge: Over 30 minutes  SIGNED:   Darliss Cheney, MD  [facility] 05/28/2019, 11:34 AM Pager [phone]  If 7PM-7AM, please contact night-coverage [email or web address] Password [email or web address]

## 2019-05-28 NOTE — Plan of Care (Signed)

## 2019-05-29 DIAGNOSIS — R509 Fever, unspecified: Secondary | ICD-10-CM

## 2019-05-29 LAB — CBC WITH DIFFERENTIAL/PLATELET
Abs Immature Granulocytes: 0.26 10*3/uL — ABNORMAL HIGH (ref 0.00–0.07)
Abs Immature Granulocytes: 0.31 10*3/uL — ABNORMAL HIGH (ref 0.00–0.07)
Basophils Absolute: 0.1 10*3/uL (ref 0.0–0.1)
Basophils Absolute: 0.1 10*3/uL (ref 0.0–0.1)
Basophils Relative: 1 %
Basophils Relative: 1 %
Eosinophils Absolute: 0.4 10*3/uL (ref 0.0–0.5)
Eosinophils Absolute: 0.4 10*3/uL (ref 0.0–0.5)
Eosinophils Relative: 4 %
Eosinophils Relative: 4 %
HCT: 35 % — ABNORMAL LOW (ref 36.0–46.0)
HCT: 37.8 % (ref 36.0–46.0)
Hemoglobin: 11.2 g/dL — ABNORMAL LOW (ref 12.0–15.0)
Hemoglobin: 12.1 g/dL (ref 12.0–15.0)
Immature Granulocytes: 3 %
Immature Granulocytes: 3 %
Lymphocytes Relative: 29 %
Lymphocytes Relative: 27 %
Lymphs Abs: 3 10*3/uL (ref 0.7–4.0)
Lymphs Abs: 2.9 10*3/uL (ref 0.7–4.0)
MCH: 22 pg — ABNORMAL LOW (ref 26.0–34.0)
MCH: 21.9 pg — ABNORMAL LOW (ref 26.0–34.0)
MCHC: 32 g/dL (ref 30.0–36.0)
MCHC: 32 g/dL (ref 30.0–36.0)
MCV: 68.6 fL — ABNORMAL LOW (ref 80.0–100.0)
MCV: 68.5 fL — ABNORMAL LOW (ref 80.0–100.0)
Monocytes Absolute: 1.2 10*3/uL — ABNORMAL HIGH (ref 0.1–1.0)
Monocytes Absolute: 1.2 10*3/uL — ABNORMAL HIGH (ref 0.1–1.0)
Monocytes Relative: 12 %
Monocytes Relative: 11 %
Neutro Abs: 5.3 10*3/uL (ref 1.7–7.7)
Neutro Abs: 6 10*3/uL (ref 1.7–7.7)
Neutrophils Relative %: 51 %
Neutrophils Relative %: 54 %
Platelets: 468 10*3/uL — ABNORMAL HIGH (ref 150–400)
Platelets: 495 10*3/uL — ABNORMAL HIGH (ref 150–400)
RBC: 5.1 MIL/uL (ref 3.87–5.11)
RBC: 5.52 MIL/uL — ABNORMAL HIGH (ref 3.87–5.11)
RDW: 17.2 % — ABNORMAL HIGH (ref 11.5–15.5)
RDW: 17.8 % — ABNORMAL HIGH (ref 11.5–15.5)
WBC: 10.3 10*3/uL (ref 4.0–10.5)
WBC: 10.9 10*3/uL — ABNORMAL HIGH (ref 4.0–10.5)
nRBC: 0.3 % — ABNORMAL HIGH (ref 0.0–0.2)
nRBC: 0.2 % (ref 0.0–0.2)

## 2019-05-29 LAB — COMPREHENSIVE METABOLIC PANEL
ALT: 53 U/L — ABNORMAL HIGH (ref 0–44)
AST: 42 U/L — ABNORMAL HIGH (ref 15–41)
Albumin: 2.8 g/dL — ABNORMAL LOW (ref 3.5–5.0)
Alkaline Phosphatase: 60 U/L (ref 38–126)
Anion gap: 11 (ref 5–15)
BUN: <5 mg/dL — ABNORMAL LOW (ref 6–20)
CO2: 22 mmol/L (ref 22–32)
Calcium: 7.9 mg/dL — ABNORMAL LOW (ref 8.9–10.3)
Chloride: 106 mmol/L (ref 98–111)
Creatinine, Ser: 0.71 mg/dL (ref 0.44–1.00)
GFR calc Af Amer: >60 mL/min (ref >60)
GFR calc non Af Amer: >60 mL/min (ref >60)
Glucose, Bld: 157 mg/dL — ABNORMAL HIGH (ref 70–99)
Potassium: 3 mmol/L — ABNORMAL LOW (ref 3.5–5.1)
Sodium: 139 mmol/L (ref 135–145)
Total Bilirubin: 0.8 mg/dL (ref 0.3–1.2)
Total Protein: 6.8 g/dL (ref 6.5–8.1)

## 2019-05-29 LAB — CULTURE, BLOOD (ROUTINE X 2)
Culture: NO GROWTH
Culture: NO GROWTH
Special Requests: ADEQUATE

## 2019-05-29 LAB — GLUCOSE, CAPILLARY
Glucose-Capillary: 140 mg/dL — ABNORMAL HIGH (ref 70–99)
Glucose-Capillary: 179 mg/dL — ABNORMAL HIGH (ref 70–99)

## 2019-05-29 MED ORDER — POTASSIUM CHLORIDE CRYS ER 20 MEQ PO TBCR
40.0000 meq | EXTENDED_RELEASE_TABLET | ORAL | Status: AC
  Administered 2019-05-29 (×2): 40 meq via ORAL
  Filled 2019-05-29 (×2): qty 2

## 2019-05-29 MED ORDER — PANTOPRAZOLE SODIUM 40 MG IV SOLR
40.0000 mg | INTRAVENOUS | Status: DC
  Administered 2019-05-29: 10:00:00 40 mg via INTRAVENOUS
  Filled 2019-05-29: qty 40

## 2019-05-29 MED ORDER — LORATADINE 10 MG PO TABS
10.0000 mg | ORAL_TABLET | Freq: Every day | ORAL | Status: DC | PRN
  Administered 2019-05-29: 10 mg via ORAL
  Filled 2019-05-29: qty 1

## 2019-05-29 NOTE — Evaluation (Signed)
Physical Therapy Evaluation Patient Details Name: Carol Mueller MRN: [id number] DOB: 11-Feb-1961 Today's Date: 05/29/2019   History of Present Illness  Patient is a 58 year old female admitted with fever, body aches, chills. 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  Clinical Impression  Patient pleasant agrees to participate in PT. Reports she gets up to bathroom independently. Demonstrates safe mobility walking and managing her IV pole. Patient reports some SOB/fatigue. Encouraged her to walk when she gets home to improve endurance and activity tolerance. Patient verbalized understanding. Patient does not require PT follow up at this time.       Follow Up Recommendations No PT follow up    Equipment Recommendations  None recommended by PT    Recommendations for Other Services       Precautions / Restrictions Precautions Precautions: None Precaution Comments: low fall      Mobility  Bed Mobility               General bed mobility comments: Not assesed, patient in recliner and remained in recliner  Transfers Overall transfer level: Independent Equipment used: None                Ambulation/Gait   Gait Distance (Feet): 30 Feet Assistive device: None Gait Pattern/deviations: WFL(Within Functional Limits)     General Gait Details: patient without ambulator deficits.  Stairs            Wheelchair Mobility    Modified Rankin (Stroke Patients Only)       Balance Overall balance assessment: Independent                                           Pertinent Vitals/Pain Pain Assessment: No/denies pain    Home Living Family/patient expects to be discharged to:: Private residence Living Arrangements: Alone Available Help at Discharge: Family;Friend(s);Available PRN/intermittently Type of Home: House Home Access: Stairs to enter Entrance Stairs-Rails: Psychiatric nurse of Steps: 4 Home Layout: One  level Home Equipment: None      Prior Function Level of Independence: Independent               Hand Dominance        Extremity/Trunk Assessment   Upper Extremity Assessment Upper Extremity Assessment: Overall WFL for tasks assessed    Lower Extremity Assessment Lower Extremity Assessment: Overall WFL for tasks assessed    Cervical / Trunk Assessment Cervical / Trunk Assessment: Normal  Communication   Communication: No difficulties  Cognition Arousal/Alertness: Awake/alert Behavior During Therapy: WFL for tasks assessed/performed Overall Cognitive Status: Within Functional Limits for tasks assessed                                        General Comments      Exercises     Assessment/Plan    PT Assessment Patent does not need any further PT services  PT Problem List         PT Treatment Interventions Patient/family education    PT Goals (Current goals can be found in the Care Plan section)  Acute Rehab PT Goals Patient Stated Goal: to return home PT Goal Formulation: With patient Time For Goal Achievement: 05/31/19 Potential to Achieve Goals: Good    Frequency     Barriers to discharge  Co-evaluation               AM-PAC PT "6 Clicks" Mobility  Outcome Measure Help needed turning from your back to your side while in a flat bed without using bedrails?: None Help needed moving from lying on your back to sitting on the side of a flat bed without using bedrails?: None Help needed moving to and from a bed to a chair (including a wheelchair)?: None Help needed standing up from a chair using your arms (e.g., wheelchair or bedside chair)?: None Help needed to walk in hospital room?: None Help needed climbing 3-5 steps with a railing? : None 6 Click Score: 24    End of Session Equipment Utilized During Treatment: Gait belt Activity Tolerance: Patient tolerated treatment well Patient left: in chair;with call bell/phone  within reach Nurse Communication: Mobility status      Time: 1005-1016 PT Time Calculation (min) (ACUTE ONLY): 11 min   Charges:              [employer], PT, [employer] 05/29/19,10:27 AM

## 2019-05-29 NOTE — Discharge Summary (Addendum)
Physician Discharge Summary  Carol Mueller ZJI:[id number] DOB: 10/07/1961 DOA: 05/24/2019  PCP: Shawnee Knapp, MD  Admit date: 05/24/2019 Discharge date: 05/29/2019  Admitted From: Home Disposition: Home  Recommendations for Outpatient Follow-up:  1. Follow up with PCP in 1-2 weeks 2. Please obtain BMP/CBC in 2 to 3 days. 3. Please follow up on the following pending results:  Home Health: No Equipment/Devices: None  Discharge Condition: Stable CODE STATUS: Full code Diet recommendation: Cardiac  Subjective: Patient seen and examined.  Reportedly, she had bloody bowel movement yesterday only one episode.  For that reason, patient was not comfortable going home so discharge was canceled.  CBC was checked 2 hours after the episode which showed stable hemoglobin.  Since then they have been tomorrow CBC checks and in fact her hemoglobin has improved compared to yesterday.  She has no more complaint and no more episode and she is now comfortable going home.  Brief/Interim Summary: Carol Mueller is a 58 year old female with a past medical history of unilateral adrenalectomy for Cushing's syndrome, type 2 diabetes mellitus on insulin, hypertension, hyperlipidemia and diverticulosis who presented to ED on 05/24/2019 with complaint of fever, chills body aches nausea for past 4 days.  No shortness of breath, cough or rash or dysuria or any urinary complaints.  No bowel complaints either.  Per patient, she was having high-grade fever at home up to 103.  Upon arrival to the emergency department, she was febrile with 102.2.  Slightly tachypneic with 28 of respiratory rate and heart rate of 102.  She was also having leukocytosis.  She was given IV cefepime, Flagyl and vancomycin in the ED and was admitted under hospitalist service with working diagnosis of sepsis of unknown source.  She was COVID-19 negative.  She also had CT angiogram of the chest and was ruled out of PE however she was found to have incidental  finding of thrombus in the distal inferior mesenteric vein for which vascular surgery was consulted through the emergency department.  Per vascular surgery recommendation, patient does not need any systemic anticoagulation for this.  After admission, she continued to have persistent fever which was mostly low-grade so ID was consulted and she was seen by Dr. Prince Rome.  Dr. Prince Rome had seen the patient on 05/27/2019.  Please note that there is no documented note from him which he said he will document soon however I called him over the phone this morning and he told me that his working diagnosis is Salmonella enteritis especially that she has incidental finding of mesenteric vein thrombosis and that he recommends ciprofloxacin for 10 days..  She has been ruled out of C. Difficile.  Patient's leukocytosis is improving and she has remained afebrile for 24 hours so she will be discharged on ciprofloxacin for 10 days that she will follow with PCP.  Please note that patient was initially discharged on 05/28/2019 but due to ? Bloody bowel movement she was not comfortable going home and discharge was canceled.  She has not had any further bloody bowel movement since then and her hemoglobin has in fact improved since (raises susicion of possible factitious event )  so she is going to be discharged home now that she is agreeable to going home and comfortable as well.  She will continue 10 days of oral antibiotic that she was prescribed yesterday.  She will follow with PCP.  Her hemoglobin is 3.0 today.  She is going to get 2 doses of potassium replacement with 40  mEq and I asked her to ask her PCP to check BMP in next 2 days.  Discharge Diagnoses:  Principal Problem:   Sepsis ([place]) Active Problems:   Essential hypertension   Abdominal pain   Insulin-requiring or dependent type II diabetes mellitus ([place])   Mesenteric vein thrombosis   Salmonella enteritis   Fever    Discharge Instructions  Discharge Instructions     Discharge patient   Complete by: As directed    Please discharge patient after giving her second dose of potassium chloride labs ordered today.   Discharge disposition: 01-Home or Self Care   Discharge patient date: 05/29/2019     Allergies as of 05/29/2019      Reactions   Norvasc [amlodipine Besylate] Swelling   ANGIOEDEMA   Nsaids Other (See Comments)   GI upset/ GI bleed      Medication List    TAKE these medications   ADVANCED COLLAGEN PO Take 1 scoop by mouth once a week.   atorvastatin 10 MG tablet Commonly known as: LIPITOR Take 10 mg by mouth daily.   Basaglar KwikPen 100 UNIT/ML Sopn Inject 30 Units into the skin daily.   carvedilol 3.125 MG tablet Commonly known as: COREG Take 3.125 mg by mouth 2 (two) times daily with a meal.   ciprofloxacin 500 MG tablet Commonly known as: Cipro Take 1 tablet (500 mg total) by mouth 2 (two) times daily for 10 days.   dicyclomine 20 MG tablet Commonly known as: BENTYL TAKE 1 TABLET BY MOUTH TWICE A DAY What changed:   when to take this  reasons to take this   hydrochlorothiazide 25 MG tablet Commonly known as: HYDRODIURIL Take 25 mg by mouth daily.   Jardiance 10 MG Tabs tablet Generic drug: empagliflozin Take 10 mg by mouth daily.   Lutein-Zeaxanthin 20-1 MG Caps Take 1 capsule by mouth daily.   metFORMIN 1000 MG tablet Commonly known as: GLUCOPHAGE Take 1 tablet (1,000 mg total) by mouth 2 (two) times daily with a meal. PATIENT NEEDS OFFICE VISIT FOR ADDITIONAL REFILLS What changed: additional instructions   multivitamin with minerals tablet Take 1 tablet by mouth daily.   olmesartan 40 MG tablet Commonly known as: BENICAR Take 40 mg by mouth daily.   Omega 3 1000 MG Caps Take 1,000 mg by mouth daily.      Follow-up Information    Schedule an appointment as soon as possible for a visit  with Shawnee Knapp, MD.   Specialty: [facility] Contact information: [place]  [address] [phone]        Go to  [place].   Specialty: Emergency Medicine Why: As needed, If symptoms worsen Contact information: [address] [id number] [place] [address] [phone]         Allergies  Allergen Reactions  . Norvasc [Amlodipine Besylate] Swelling    ANGIOEDEMA  . Nsaids Other (See Comments)    GI upset/ GI bleed    Consultations: ID and vascular surgery   Procedures/Studies: Ct Angio Chest Pe W And/or Wo Contrast  Result Date: 05/24/2019 CLINICAL DATA:  Fever and body aches since Sunday. EXAM: CT ANGIOGRAPHY CHEST CT ABDOMEN AND PELVIS WITH CONTRAST TECHNIQUE: Multidetector CT imaging of the chest was performed using the standard protocol during bolus administration of intravenous contrast. Multiplanar CT image reconstructions and MIPs were obtained to evaluate the vascular anatomy. Multidetector CT imaging of the abdomen and pelvis was performed using the  standard protocol during bolus administration of intravenous contrast. CONTRAST:  15mL OMNIPAQUE IOHEXOL 350 MG/ML SOLN COMPARISON:  Chest x-ray from same day. CT abdomen pelvis dated June 02, 2018. FINDINGS: CTA CHEST FINDINGS Cardiovascular: Satisfactory opacification of the pulmonary arteries to the segmental level. No evidence of pulmonary embolism. Normal heart size. No pericardial effusion. No thoracic aortic aneurysm or dissection. Mediastinum/Nodes: No enlarged mediastinal, hilar, or axillary lymph nodes. Thyroid gland, trachea, and esophagus demonstrate no significant findings. Lungs/Pleura: No focal consolidation, pleural effusion, or pneumothorax. Two 4 mm nodules in the right upper lobe (series 11, images 26 and 33). Musculoskeletal: No chest wall abnormality. No acute or significant osseous findings. Review of the MIP images confirms the above findings. CT ABDOMEN AND PELVIS FINDINGS Hepatobiliary: Unchanged diffuse hepatic  steatosis. No focal liver abnormality. Prior cholecystectomy. No biliary dilatation. Pancreas: Unremarkable. No pancreatic ductal dilatation or surrounding inflammatory changes. Spleen: Normal in size without focal abnormality. Adrenals/Urinary Tract: Prior right adrenalectomy. The left adrenal gland is unremarkable. Unchanged bilateral renal simple cysts. No renal calculi or hydronephrosis. The bladder is unremarkable. Stomach/Bowel: Stomach is within normal limits. Appendix appears normal. No evidence of bowel wall thickening, distention, or inflammatory changes. Extensive colonic diverticulosis again noted. 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 No enlarged abdominal or pelvic lymph nodes. Reproductive: Multiple calcified uterine fibroids again noted. No adnexal mass. Other: Unchanged tiny fat containing right inguinal and umbilical hernias. No free fluid or pneumoperitoneum. Musculoskeletal: No acute or significant osseous findings. Review of the MIP images confirms the above findings. IMPRESSION: Chest: 1. No evidence of pulmonary embolism. No acute intrathoracic process. 2. Two 4 mm pulmonary nodules in the right upper lobe. No follow-up needed if patient is low-risk (and has no known or suspected primary neoplasm). Non-contrast chest CT can be considered in 12 months if patient is high-risk. This recommendation follows the consensus statement: Guidelines for Management of Incidental Pulmonary Nodules Detected on CT Images: From the Fleischner Society 2017; Radiology 2017; 284:228-243. Abdomen and pelvis: 1. 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 2. Extensive colonic diverticulosis again noted. No evidence of acute diverticulitis. 3. Unchanged hepatic steatosis.  4. Unchanged enlarged fibroid uterus. Electronically Signed   By: Titus Dubin M.D.   On: 05/24/2019 16:47   Ct Abdomen Pelvis W Contrast  Result Date: 05/24/2019 CLINICAL DATA:  Fever and body aches since Sunday. EXAM: CT ANGIOGRAPHY CHEST CT ABDOMEN AND PELVIS WITH CONTRAST TECHNIQUE: Multidetector CT imaging of the chest was performed using the standard protocol during bolus administration of intravenous contrast. Multiplanar CT image reconstructions and MIPs were obtained to evaluate the vascular anatomy. Multidetector CT imaging of the abdomen and pelvis was performed using the standard protocol during bolus administration of intravenous contrast. CONTRAST:  13mL OMNIPAQUE IOHEXOL 350 MG/ML SOLN COMPARISON:  Chest x-ray from same day. CT abdomen pelvis dated June 02, 2018. FINDINGS: CTA CHEST FINDINGS Cardiovascular: Satisfactory opacification of the pulmonary arteries to the segmental level. No evidence of pulmonary embolism. Normal heart size. No pericardial effusion. No thoracic aortic aneurysm or dissection. Mediastinum/Nodes: No enlarged mediastinal, hilar, or axillary lymph nodes. Thyroid gland, trachea, and esophagus demonstrate no significant findings. Lungs/Pleura: No focal consolidation, pleural effusion, or pneumothorax. Two 4 mm nodules in the right upper lobe (series 11, images 26 and 33). Musculoskeletal: No chest wall abnormality. No acute or  significant osseous findings. Review of the MIP images confirms the above findings. CT ABDOMEN AND PELVIS FINDINGS Hepatobiliary: Unchanged diffuse hepatic steatosis. No focal liver abnormality. Prior cholecystectomy. No biliary dilatation. Pancreas: Unremarkable. No pancreatic ductal dilatation or surrounding inflammatory changes. Spleen: Normal in size without focal abnormality. Adrenals/Urinary Tract: Prior right adrenalectomy. The left adrenal gland is unremarkable. Unchanged bilateral renal simple cysts. No renal calculi or hydronephrosis. The  bladder is unremarkable. Stomach/Bowel: Stomach is within normal limits. Appendix appears normal. No evidence of bowel wall thickening, distention, or inflammatory changes. Extensive colonic diverticulosis again noted. 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 No enlarged abdominal or pelvic lymph nodes. Reproductive: Multiple calcified uterine fibroids again noted. No adnexal mass. Other: Unchanged tiny fat containing right inguinal and umbilical hernias. No free fluid or pneumoperitoneum. Musculoskeletal: No acute or significant osseous findings. Review of the MIP images confirms the above findings. IMPRESSION: Chest: 1. No evidence of pulmonary embolism. No acute intrathoracic process. 2. Two 4 mm pulmonary nodules in the right upper lobe. No follow-up needed if patient is low-risk (and has no known or suspected primary neoplasm). Non-contrast chest CT can be considered in 12 months if patient is high-risk. This recommendation follows the consensus statement: Guidelines for Management of Incidental Pulmonary Nodules Detected on CT Images: From the Fleischner Society 2017; Radiology 2017; 284:228-243. Abdomen and pelvis: 1. 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 2. Extensive colonic diverticulosis again noted. No evidence of acute diverticulitis. 3. Unchanged hepatic steatosis. 4. Unchanged enlarged fibroid uterus. Electronically Signed   By: Titus Dubin M.D.   On: 05/24/2019 16:47   Dg Chest Port 1 View  Result Date: 05/24/2019 CLINICAL DATA:  Fever and body aches. EXAM: PORTABLE CHEST 1 VIEW COMPARISON:  None. FINDINGS: The heart size and mediastinal contours are within normal limits. Both lungs are clear. The visualized skeletal structures are unremarkable.  IMPRESSION: No active cardiopulmonary disease. Electronically Signed   By: Abelardo Diesel M.D.   On: 05/24/2019 13:37     Discharge Exam: Vitals:   05/29/19 0538 05/29/19 1020  BP:    Pulse:    Resp: (!) 21   Temp:    SpO2:  95%   Vitals:   05/28/19 2114 05/29/19 0534 05/29/19 0538 05/29/19 1020  BP: (!) 147/95 (!) 159/100    Pulse: 90 91    Resp: (!) 22 (!) 30 (!) 21   Temp: 98.2 F (36.8 C) 98.9 F (37.2 C)    TempSrc:      SpO2: 99% 96%  95%  Weight:      Height:       General exam: Appears calm and comfortable  Respiratory system: Clear to auscultation. Respiratory effort normal. Cardiovascular system: S1 & S2 heard, RRR. No JVD, murmurs, rubs, gallops or clicks. No pedal edema. Gastrointestinal system: Abdomen is nondistended, soft and nontender. No organomegaly or masses felt. Normal bowel sounds heard. Central nervous system: Alert and oriented. No focal neurological deficits. Extremities: Symmetric 5 x 5 power. Skin: No rashes, lesions or ulcers Psychiatry: Judgement and insight appear normal. Mood & affect appropriate.     The results of significant diagnostics from this hospitalization (including imaging, microbiology, ancillary and laboratory) are listed below for reference.     Microbiology: Recent Results (from the past 240 hour(s))  Culture, blood (routine x 2)  Status: None (Preliminary result)   Collection Time: 05/24/19  1:26 PM   Specimen: BLOOD  Result Value Ref Range Status   Specimen Description BLOOD LEFT ANTECUBITAL  Final   Special Requests   Final    BOTTLES DRAWN AEROBIC AND ANAEROBIC Blood Culture adequate volume   Culture   Final    NO GROWTH 4 DAYS Performed at [facility], [place] [address] [place], [place] [address]    Report Status PENDING  Incomplete  SARS Coronavirus 2 (CEPHEID- Performed in [place] hospital lab), Hosp Order     Status: None   Collection Time: 05/24/19  1:27 PM   Specimen: Nasopharyngeal Swab   Result Value Ref Range Status   SARS Coronavirus 2 NEGATIVE NEGATIVE Final    Comment: (NOTE) If result is NEGATIVE SARS-CoV-2 target nucleic acids are NOT DETECTED. The SARS-CoV-2 RNA is generally detectable in upper and lower  respiratory specimens during the acute phase of infection. The lowest  concentration of SARS-CoV-2 viral copies this assay can detect is 250  copies / mL. A negative result does not preclude SARS-CoV-2 infection  and should not be used as the sole basis for treatment or other  patient management decisions.  A negative result may occur with  improper specimen collection / handling, submission of specimen other  than nasopharyngeal swab, presence of viral mutation(s) within the  areas targeted by this assay, and inadequate number of viral copies  (<250 copies / mL). A negative result must be combined with clinical  observations, patient history, and epidemiological information. If result is POSITIVE SARS-CoV-2 target nucleic acids are DETECTED. The SARS-CoV-2 RNA is generally detectable in upper and lower  respiratory specimens dur ing the acute phase of infection.  Positive  results are indicative of active infection with SARS-CoV-2.  Clinical  correlation with patient history and other diagnostic information is  necessary to determine patient infection status.  Positive results do  not rule out bacterial infection or co-infection with other viruses. If result is PRESUMPTIVE POSTIVE SARS-CoV-2 nucleic acids MAY BE PRESENT.   A presumptive positive result was obtained on the submitted specimen  and confirmed on repeat testing.  While 2019 novel coronavirus  (SARS-CoV-2) nucleic acids may be present in the submitted sample  additional confirmatory testing may be necessary for epidemiological  and / or clinical management purposes  to differentiate between  SARS-CoV-2 and other Sarbecovirus currently known to infect humans.  If clinically indicated additional  testing with an alternate test  methodology [phone]) is advised. The SARS-CoV-2 RNA is generally  detectable in upper and lower respiratory sp ecimens during the acute  phase of infection. The expected result is Negative. Fact Sheet for Patients:  StrictlyIdeas.no Fact Sheet for Healthcare Providers: BankingDealers.co.za This test is not yet approved or cleared by the [place] FDA and has been authorized for detection and/or diagnosis of SARS-CoV-2 by FDA under an Emergency Use Authorization (EUA).  This EUA will remain in effect (meaning this test can be used) for the duration of the COVID-19 declaration under Section 564(b)(1) of the Act, 21 U.S.C. section 360bbb-3(b)(1), unless the authorization is terminated or revoked sooner. Performed at [facility], [name] [address], [place], [place] [address]   Culture, blood (routine x 2)     Status: None (Preliminary result)   Collection Time: 05/24/19  1:35 PM   Specimen: BLOOD  Result Value Ref Range Status   Specimen Description BLOOD RIGHT ANTECUBITAL  Final   Special  Requests   Final    BOTTLES DRAWN AEROBIC AND ANAEROBIC Blood Culture results may not be optimal due to an inadequate volume of blood received in culture bottles   Culture   Final    NO GROWTH 4 DAYS Performed at [facility], [place] [address], [place], [place] [address]    Report Status PENDING  Incomplete  Urine culture     Status: None   Collection Time: 05/24/19  2:34 PM   Specimen: Urine, Random  Result Value Ref Range Status   Specimen Description URINE, RANDOM  Final   Special Requests NONE  Final   Culture   Final    NO GROWTH Performed at [facility], [place] [address], [place], [place] [address]    Report Status 05/25/2019 FINAL  Final  C difficile quick scan w PCR reflex     Status: None   Collection Time: 05/27/19  2:55 PM   Specimen: STOOL  Result Value Ref Range Status   C Diff  antigen NEGATIVE NEGATIVE Final   C Diff toxin NEGATIVE NEGATIVE Final   C Diff interpretation No C. difficile detected.  Final    Comment: Performed at [facility], [place] [address] [place], [place] [address]  Gastrointestinal Panel by PCR , Stool     Status: None   Collection Time: 05/27/19  2:55 PM   Specimen: STOOL  Result Value Ref Range Status   Campylobacter species NOT DETECTED NOT DETECTED Final   Plesimonas shigelloides NOT DETECTED NOT DETECTED Final   Salmonella species NOT DETECTED NOT DETECTED Final   Yersinia enterocolitica NOT DETECTED NOT DETECTED Final   Vibrio species NOT DETECTED NOT DETECTED Final   Vibrio cholerae NOT DETECTED NOT DETECTED Final   Enteroaggregative E coli (EAEC) NOT DETECTED NOT DETECTED Final   Enteropathogenic E coli (EPEC) NOT DETECTED NOT DETECTED Final   Enterotoxigenic E coli (ETEC) NOT DETECTED NOT DETECTED Final   Shiga like toxin producing E coli (STEC) NOT DETECTED NOT DETECTED Final   Shigella/Enteroinvasive E coli (EIEC) NOT DETECTED NOT DETECTED Final   Cryptosporidium NOT DETECTED NOT DETECTED Final   Cyclospora cayetanensis NOT DETECTED NOT DETECTED Final   Entamoeba histolytica NOT DETECTED NOT DETECTED Final   Giardia lamblia NOT DETECTED NOT DETECTED Final   Adenovirus F40/41 NOT DETECTED NOT DETECTED Final   Astrovirus NOT DETECTED NOT DETECTED Final   Norovirus GI/GII NOT DETECTED NOT DETECTED Final   Rotavirus A NOT DETECTED NOT DETECTED Final   Sapovirus (I, II, IV, and V) NOT DETECTED NOT DETECTED Final    Comment: Performed at [facility], [place] [name], [place] [address]     Labs: BNP (last 3 results) No results for input(s): BNP in the last 8760 hours. Basic Metabolic Panel: Recent Labs  Lab 05/25/19 0244 05/25/19 0617 05/26/19 0230 05/27/19 0327 05/28/19 0841 05/29/19 0806  NA 135  --  138 140 138 139  K 3.5  --  3.7 3.7 3.3* 3.0*  CL 100  --  104 107 108 106  CO2 23  --  24 22  20* 22  GLUCOSE 105*  --  156* 169* 230* 157*  BUN 12  --  7 6 5* <5*  CREATININE 1.03*  --  0.88 0.79 0.67 0.71  CALCIUM 8.3*  --  8.0* 7.9* 7.6* 7.9*  MG  --  1.8  --   --   --   --    Liver Function  Tests: Recent Labs  Lab 05/25/19 0244 05/26/19 0230 05/27/19 0327 05/28/19 0841 05/29/19 0806  AST 54* 74* 45* 46* 42*  ALT 71* 81* 67* 59* 53*  ALKPHOS 59 66 71 65 60  BILITOT 0.8 0.7 0.8 0.6 0.8  PROT 6.9 6.8 6.9 6.8 6.8  ALBUMIN 3.1* 2.9* 2.8* 2.7* 2.8*   Recent Labs  Lab 05/24/19 1327  LIPASE 40   No results for input(s): AMMONIA in the last 168 hours. CBC: Recent Labs  Lab 05/26/19 0907 05/27/19 0514 05/28/19 0841 05/28/19 1411 05/29/19 0314 05/29/19 0806  WBC 13.6* 14.1* 11.5* 11.6* 10.3 10.9*  NEUTROABS 8.5* 8.8* 6.7  --  5.3 6.0  HGB 12.2 11.9* 12.2 11.8* 11.2* 12.1  HCT 39.5 38.5 38.5 37.2 35.0* 37.8  MCV 70.7* 70.6* 69.5* 69.4* 68.6* 68.5*  PLT 282 359 403* 439* 468* 495*   Cardiac Enzymes: No results for input(s): CKTOTAL, CKMB, CKMBINDEX, TROPONINI in the last 168 hours. BNP: Invalid input(s): POCBNP CBG: Recent Labs  Lab 05/28/19 0809 05/28/19 1227 05/28/19 1647 05/28/19 2111 05/29/19 0755  GLUCAP 190* 153* 153* 192* 140*   D-Dimer No results for input(s): DDIMER in the last 72 hours. Hgb A1c No results for input(s): HGBA1C in the last 72 hours. Lipid Profile No results for input(s): CHOL, HDL, LDLCALC, TRIG, CHOLHDL, LDLDIRECT in the last 72 hours. Thyroid function studies No results for input(s): TSH, T4TOTAL, T3FREE, THYROIDAB in the last 72 hours.  Invalid input(s): FREET3 Anemia work up No results for input(s): VITAMINB12, FOLATE, FERRITIN, TIBC, IRON, RETICCTPCT in the last 72 hours. Urinalysis    Component Value Date/Time   COLORURINE YELLOW 05/24/2019 1430   APPEARANCEUR HAZY (A) 05/24/2019 1430   LABSPEC 1.020 05/24/2019 1430   PHURINE 5.0 05/24/2019 1430   GLUCOSEU >=500 (A) 05/24/2019 1430   HGBUR NEGATIVE 05/24/2019  1430   BILIRUBINUR NEGATIVE 05/24/2019 1430   BILIRUBINUR neg 08/25/2014 1157   KETONESUR 5 (A) 05/24/2019 1430   PROTEINUR NEGATIVE 05/24/2019 1430   UROBILINOGEN 0.2 08/25/2014 1157   NITRITE NEGATIVE 05/24/2019 1430   LEUKOCYTESUR NEGATIVE 05/24/2019 1430   Sepsis Labs Invalid input(s): PROCALCITONIN,  WBC,  LACTICIDVEN Microbiology Recent Results (from the past 240 hour(s))  Culture, blood (routine x 2)     Status: None (Preliminary result)   Collection Time: 05/24/19  1:26 PM   Specimen: BLOOD  Result Value Ref Range Status   Specimen Description BLOOD LEFT ANTECUBITAL  Final   Special Requests   Final    BOTTLES DRAWN AEROBIC AND ANAEROBIC Blood Culture adequate volume   Culture   Final    NO GROWTH 4 DAYS Performed at [facility], [place] [address] [place], [place] [address]    Report Status PENDING  Incomplete  SARS Coronavirus 2 (CEPHEID- Performed in [place] hospital lab), Hosp Order     Status: None   Collection Time: 05/24/19  1:27 PM   Specimen: Nasopharyngeal Swab  Result Value Ref Range Status   SARS Coronavirus 2 NEGATIVE NEGATIVE Final    Comment: (NOTE) If result is NEGATIVE SARS-CoV-2 target nucleic acids are NOT DETECTED. The SARS-CoV-2 RNA is generally detectable in upper and lower  respiratory specimens during the acute phase of infection. The lowest  concentration of SARS-CoV-2 viral copies this assay can detect is 250  copies / mL. A negative result does not preclude SARS-CoV-2 infection  and should not be used as the sole basis for treatment or other  patient management decisions.  A negative  result may occur with  improper specimen collection / handling, submission of specimen other  than nasopharyngeal swab, presence of viral mutation(s) within the  areas targeted by this assay, and inadequate number of viral copies  (<250 copies / mL). A negative result must be combined with clinical  observations, patient history, and epidemiological  information. If result is POSITIVE SARS-CoV-2 target nucleic acids are DETECTED. The SARS-CoV-2 RNA is generally detectable in upper and lower  respiratory specimens dur ing the acute phase of infection.  Positive  results are indicative of active infection with SARS-CoV-2.  Clinical  correlation with patient history and other diagnostic information is  necessary to determine patient infection status.  Positive results do  not rule out bacterial infection or co-infection with other viruses. If result is PRESUMPTIVE POSTIVE SARS-CoV-2 nucleic acids MAY BE PRESENT.   A presumptive positive result was obtained on the submitted specimen  and confirmed on repeat testing.  While 2019 novel coronavirus  (SARS-CoV-2) nucleic acids may be present in the submitted sample  additional confirmatory testing may be necessary for epidemiological  and / or clinical management purposes  to differentiate between  SARS-CoV-2 and other Sarbecovirus currently known to infect humans.  If clinically indicated additional testing with an alternate test  methodology [phone]) is advised. The SARS-CoV-2 RNA is generally  detectable in upper and lower respiratory sp ecimens during the acute  phase of infection. The expected result is Negative. Fact Sheet for Patients:  StrictlyIdeas.no Fact Sheet for Healthcare Providers: BankingDealers.co.za This test is not yet approved or cleared by the [place] FDA and has been authorized for detection and/or diagnosis of SARS-CoV-2 by FDA under an Emergency Use Authorization (EUA).  This EUA will remain in effect (meaning this test can be used) for the duration of the COVID-19 declaration under Section 564(b)(1) of the Act, 21 U.S.C. section 360bbb-3(b)(1), unless the authorization is terminated or revoked sooner. Performed at [facility], [place] [address], [place], [place] [address]   Culture, blood (routine x 2)      Status: None (Preliminary result)   Collection Time: 05/24/19  1:35 PM   Specimen: BLOOD  Result Value Ref Range Status   Specimen Description BLOOD RIGHT ANTECUBITAL  Final   Special Requests   Final    BOTTLES DRAWN AEROBIC AND ANAEROBIC Blood Culture results may not be optimal due to an inadequate volume of blood received in culture bottles   Culture   Final    NO GROWTH 4 DAYS Performed at [facility], [place] [address], [place], [place] [address]    Report Status PENDING  Incomplete  Urine culture     Status: None   Collection Time: 05/24/19  2:34 PM   Specimen: Urine, Random  Result Value Ref Range Status   Specimen Description URINE, RANDOM  Final   Special Requests NONE  Final   Culture   Final    NO GROWTH Performed at [facility], [place] [address] [place],  [address]    Report Status 05/25/2019 FINAL  Final  C difficile quick scan w PCR reflex     Status: None   Collection Time: 05/27/19  2:55 PM   Specimen: STOOL  Result Value Ref Range Status   C Diff antigen NEGATIVE NEGATIVE Final   C Diff toxin NEGATIVE NEGATIVE Final   C Diff interpretation No C. difficile detected.  Final    Comment: Performed at [facility], [place]  [address] [place], [place] [address]  Gastrointestinal Panel by PCR , Stool     Status: None   Collection Time: 05/27/19  2:55 PM   Specimen: STOOL  Result Value Ref Range Status   Campylobacter species NOT DETECTED NOT DETECTED Final   Plesimonas shigelloides NOT DETECTED NOT DETECTED Final   Salmonella species NOT DETECTED NOT DETECTED Final   Yersinia enterocolitica NOT DETECTED NOT DETECTED Final   Vibrio species NOT DETECTED NOT DETECTED Final   Vibrio cholerae NOT DETECTED NOT DETECTED Final   Enteroaggregative E coli (EAEC) NOT DETECTED NOT DETECTED Final   Enteropathogenic E coli (EPEC) NOT DETECTED NOT DETECTED Final   Enterotoxigenic E coli (ETEC) NOT DETECTED NOT DETECTED Final   Shiga like toxin producing E  coli (STEC) NOT DETECTED NOT DETECTED Final   Shigella/Enteroinvasive E coli (EIEC) NOT DETECTED NOT DETECTED Final   Cryptosporidium NOT DETECTED NOT DETECTED Final   Cyclospora cayetanensis NOT DETECTED NOT DETECTED Final   Entamoeba histolytica NOT DETECTED NOT DETECTED Final   Giardia lamblia NOT DETECTED NOT DETECTED Final   Adenovirus F40/41 NOT DETECTED NOT DETECTED Final   Astrovirus NOT DETECTED NOT DETECTED Final   Norovirus GI/GII NOT DETECTED NOT DETECTED Final   Rotavirus A NOT DETECTED NOT DETECTED Final   Sapovirus (I, II, IV, and V) NOT DETECTED NOT DETECTED Final    Comment: Performed at [facility], [address] [place], [place] [address]     Time coordinating discharge: Over 30 minutes  SIGNED:   Darliss Cheney, MD  [facility] 05/29/2019, 10:45 AM Pager [phone]  If 7PM-7AM, please contact night-coverage [email or web address] Password [email or web address]

## 2019-05-29 NOTE — Progress Notes (Signed)
Patient alert oriented x4. Room air. Complained of shortness of breath with ambulation and leg swelling. Advised patient that continuing to increase activity such as walking will help with her swelling due to fluids given while hospitalized. MD advised no new orders and patient stable for discharge. Removed PIV and reviewed discharge instruction with patient. Patient discharged via wheelchair by staff to home to care for self.

## 2019-09-17 ENCOUNTER — Encounter (HOSPITAL_COMMUNITY): Payer: Self-pay

## 2019-09-17 ENCOUNTER — Emergency Department (HOSPITAL_COMMUNITY): Payer: No Typology Code available for payment source

## 2019-09-17 ENCOUNTER — Other Ambulatory Visit: Payer: Self-pay

## 2019-09-17 ENCOUNTER — Inpatient Hospital Stay (HOSPITAL_COMMUNITY)
Admission: EM | Admit: 2019-09-17 | Discharge: 2019-09-19 | DRG: 378 | Disposition: A | Discharge status: Home / Self Care | Payer: No Typology Code available for payment source | Attending: Internal Medicine | Admitting: Internal Medicine

## 2019-09-17 DIAGNOSIS — Z803 Family history of malignant neoplasm of breast: Secondary | ICD-10-CM

## 2019-09-17 DIAGNOSIS — K625 Hemorrhage of anus and rectum: Secondary | ICD-10-CM

## 2019-09-17 DIAGNOSIS — E282 Polycystic ovarian syndrome: Secondary | ICD-10-CM | POA: Diagnosis present

## 2019-09-17 DIAGNOSIS — Z6841 Body Mass Index (BMI) 40.0 and over, adult: Secondary | ICD-10-CM

## 2019-09-17 DIAGNOSIS — E785 Hyperlipidemia, unspecified: Secondary | ICD-10-CM | POA: Diagnosis present

## 2019-09-17 DIAGNOSIS — K760 Fatty (change of) liver, not elsewhere classified: Secondary | ICD-10-CM | POA: Diagnosis present

## 2019-09-17 DIAGNOSIS — Z833 Family history of diabetes mellitus: Secondary | ICD-10-CM

## 2019-09-17 DIAGNOSIS — N183 Chronic kidney disease, stage 3 unspecified: Secondary | ICD-10-CM | POA: Diagnosis present

## 2019-09-17 DIAGNOSIS — R6510 Systemic inflammatory response syndrome (SIRS) of non-infectious origin without acute organ dysfunction: Secondary | ICD-10-CM | POA: Diagnosis present

## 2019-09-17 DIAGNOSIS — Z886 Allergy status to analgesic agent status: Secondary | ICD-10-CM

## 2019-09-17 DIAGNOSIS — Z8249 Family history of ischemic heart disease and other diseases of the circulatory system: Secondary | ICD-10-CM

## 2019-09-17 DIAGNOSIS — Z8719 Personal history of other diseases of the digestive system: Secondary | ICD-10-CM | POA: Diagnosis not present

## 2019-09-17 DIAGNOSIS — Z794 Long term (current) use of insulin: Secondary | ICD-10-CM

## 2019-09-17 DIAGNOSIS — K922 Gastrointestinal hemorrhage, unspecified: Secondary | ICD-10-CM | POA: Diagnosis not present

## 2019-09-17 DIAGNOSIS — K5731 Diverticulosis of large intestine without perforation or abscess with bleeding: Secondary | ICD-10-CM | POA: Diagnosis not present

## 2019-09-17 DIAGNOSIS — E1122 Type 2 diabetes mellitus with diabetic chronic kidney disease: Secondary | ICD-10-CM | POA: Diagnosis present

## 2019-09-17 DIAGNOSIS — E119 Type 2 diabetes mellitus without complications: Secondary | ICD-10-CM

## 2019-09-17 DIAGNOSIS — Z20828 Contact with and (suspected) exposure to other viral communicable diseases: Secondary | ICD-10-CM | POA: Diagnosis present

## 2019-09-17 DIAGNOSIS — I129 Hypertensive chronic kidney disease with stage 1 through stage 4 chronic kidney disease, or unspecified chronic kidney disease: Secondary | ICD-10-CM | POA: Diagnosis present

## 2019-09-17 DIAGNOSIS — N179 Acute kidney failure, unspecified: Secondary | ICD-10-CM | POA: Diagnosis present

## 2019-09-17 DIAGNOSIS — Z888 Allergy status to other drugs, medicaments and biological substances status: Secondary | ICD-10-CM

## 2019-09-17 LAB — COMPREHENSIVE METABOLIC PANEL
ALT: 22 U/L (ref 0–44)
AST: 16 U/L (ref 15–41)
Albumin: 4.1 g/dL (ref 3.5–5.0)
Alkaline Phosphatase: 45 U/L (ref 38–126)
Anion gap: 13 (ref 5–15)
BUN: 26 mg/dL — ABNORMAL HIGH (ref 6–20)
CO2: 22 mmol/L (ref 22–32)
Calcium: 9.3 mg/dL (ref 8.9–10.3)
Chloride: 99 mmol/L (ref 98–111)
Creatinine, Ser: 1.55 mg/dL — ABNORMAL HIGH (ref 0.44–1.00)
GFR calc Af Amer: 42 mL/min — ABNORMAL LOW (ref >60)
GFR calc non Af Amer: 36 mL/min — ABNORMAL LOW (ref >60)
Glucose, Bld: 338 mg/dL — ABNORMAL HIGH (ref 70–99)
Potassium: 4.5 mmol/L (ref 3.5–5.1)
Sodium: 134 mmol/L — ABNORMAL LOW (ref 135–145)
Total Bilirubin: 1.1 mg/dL (ref 0.3–1.2)
Total Protein: 7.6 g/dL (ref 6.5–8.1)

## 2019-09-17 LAB — SARS CORONAVIRUS 2 (TAT 6-24 HRS): SARS Coronavirus 2: NEGATIVE

## 2019-09-17 LAB — CBC WITH DIFFERENTIAL/PLATELET
Abs Immature Granulocytes: 0.11 10*3/uL — ABNORMAL HIGH (ref 0.00–0.07)
Basophils Absolute: 0.1 10*3/uL (ref 0.0–0.1)
Basophils Relative: 1 %
Eosinophils Absolute: 0.5 10*3/uL (ref 0.0–0.5)
Eosinophils Relative: 3 %
HCT: 39.1 % (ref 36.0–46.0)
Hemoglobin: 11.7 g/dL — ABNORMAL LOW (ref 12.0–15.0)
Immature Granulocytes: 1 %
Lymphocytes Relative: 19 %
Lymphs Abs: 3.2 10*3/uL (ref 0.7–4.0)
MCH: 21.8 pg — ABNORMAL LOW (ref 26.0–34.0)
MCHC: 29.9 g/dL — ABNORMAL LOW (ref 30.0–36.0)
MCV: 72.9 fL — ABNORMAL LOW (ref 80.0–100.0)
Monocytes Absolute: 0.8 10*3/uL (ref 0.1–1.0)
Monocytes Relative: 5 %
Neutro Abs: 12.2 10*3/uL — ABNORMAL HIGH (ref 1.7–7.7)
Neutrophils Relative %: 71 %
Platelets: 368 10*3/uL (ref 150–400)
RBC: 5.36 MIL/uL — ABNORMAL HIGH (ref 3.87–5.11)
RDW: 17 % — ABNORMAL HIGH (ref 11.5–15.5)
WBC: 16.9 10*3/uL — ABNORMAL HIGH (ref 4.0–10.5)
nRBC: 0 % (ref 0.0–0.2)

## 2019-09-17 LAB — GLUCOSE, CAPILLARY
Glucose-Capillary: 148 mg/dL — ABNORMAL HIGH (ref 70–99)
Glucose-Capillary: 125 mg/dL — ABNORMAL HIGH (ref 70–99)

## 2019-09-17 LAB — HIV ANTIBODY (ROUTINE TESTING W REFLEX): HIV Screen 4th Generation wRfx: NONREACTIVE

## 2019-09-17 LAB — HEMATOCRIT
HCT: 34.2 % — ABNORMAL LOW (ref 36.0–46.0)
HCT: 29.9 % — ABNORMAL LOW (ref 36.0–46.0)

## 2019-09-17 LAB — TSH: TSH: 0.905 u[IU]/mL (ref 0.350–4.500)

## 2019-09-17 LAB — TYPE AND SCREEN
ABO/RH(D): O POS
Antibody Screen: NEGATIVE

## 2019-09-17 LAB — HEMOGLOBIN
Hemoglobin: 10.6 g/dL — ABNORMAL LOW (ref 12.0–15.0)
Hemoglobin: 9 g/dL — ABNORMAL LOW (ref 12.0–15.0)

## 2019-09-17 MED ORDER — METOCLOPRAMIDE HCL 5 MG/ML IJ SOLN
5.0000 mg | Freq: Once | INTRAMUSCULAR | Status: AC
  Administered 2019-09-17: 5 mg via INTRAVENOUS
  Filled 2019-09-17: qty 2

## 2019-09-17 MED ORDER — ADVANCED COLLAGEN PO TABS: ORAL_TABLET | ORAL | Status: DC

## 2019-09-17 MED ORDER — IOHEXOL 300 MG/ML  SOLN
75.0000 mL | Freq: Once | INTRAMUSCULAR | Status: AC | PRN
  Administered 2019-09-17: 75 mL via INTRAVENOUS

## 2019-09-17 MED ORDER — INSULIN GLARGINE 100 UNIT/ML ~~LOC~~ SOLN
15.0000 [IU] | Freq: Every day | SUBCUTANEOUS | Status: DC
  Administered 2019-09-17 – 2019-09-18 (×2): 15 [IU] via SUBCUTANEOUS
  Filled 2019-09-17 (×3): qty 0.15

## 2019-09-17 MED ORDER — CARVEDILOL 3.125 MG PO TABS
3.1250 mg | ORAL_TABLET | Freq: Two times a day (BID) | ORAL | Status: DC
  Administered 2019-09-17 – 2019-09-19 (×4): 3.125 mg via ORAL
  Filled 2019-09-17 (×4): qty 1

## 2019-09-17 MED ORDER — OMEGA-3-ACID ETHYL ESTERS 1 G PO CAPS
1.0000 g | ORAL_CAPSULE | Freq: Every day | ORAL | Status: DC
  Administered 2019-09-17 – 2019-09-19 (×3): 1 g via ORAL
  Filled 2019-09-17 (×3): qty 1

## 2019-09-17 MED ORDER — SODIUM CHLORIDE 0.9% FLUSH: 3.0000 mL | Freq: Two times a day (BID) | INTRAVENOUS | Status: DC

## 2019-09-17 MED ORDER — ADULT MULTIVITAMIN W/MINERALS CH
1.0000 | ORAL_TABLET | Freq: Every day | ORAL | Status: DC
  Administered 2019-09-17 – 2019-09-19 (×3): 1 via ORAL
  Filled 2019-09-17 (×3): qty 1

## 2019-09-17 MED ORDER — ATORVASTATIN CALCIUM 10 MG PO TABS
10.0000 mg | ORAL_TABLET | Freq: Every day | ORAL | Status: DC
  Administered 2019-09-17 – 2019-09-18 (×2): 10 mg via ORAL
  Filled 2019-09-17 (×2): qty 1

## 2019-09-17 MED ORDER — INSULIN ASPART 100 UNIT/ML ~~LOC~~ SOLN
0.0000 [IU] | Freq: Three times a day (TID) | SUBCUTANEOUS | Status: DC
  Administered 2019-09-17 (×2): 2 [IU] via SUBCUTANEOUS
  Administered 2019-09-18: 3 [IU] via SUBCUTANEOUS
  Administered 2019-09-19: 2 [IU] via SUBCUTANEOUS

## 2019-09-17 MED ORDER — SODIUM CHLORIDE 0.9 % IV BOLUS
1000.0000 mL | Freq: Once | INTRAVENOUS | Status: AC
  Administered 2019-09-17: 1000 mL via INTRAVENOUS

## 2019-09-17 MED ORDER — SODIUM CHLORIDE (PF) 0.9 % IJ SOLN
INTRAMUSCULAR | Status: AC
  Filled 2019-09-17: qty 50

## 2019-09-17 MED ORDER — SODIUM CHLORIDE 0.9 % IV SOLN: INTRAVENOUS | Status: DC

## 2019-09-17 MED ORDER — ACETAMINOPHEN 325 MG PO TABS
650.0000 mg | ORAL_TABLET | Freq: Four times a day (QID) | ORAL | Status: DC | PRN
  Administered 2019-09-17 – 2019-09-18 (×3): 650 mg via ORAL
  Filled 2019-09-17 (×3): qty 2

## 2019-09-17 MED ORDER — DICYCLOMINE HCL 10 MG PO CAPS
10.0000 mg | ORAL_CAPSULE | Freq: Three times a day (TID) | ORAL | Status: DC | PRN
  Administered 2019-09-17 – 2019-09-18 (×3): 10 mg via ORAL
  Filled 2019-09-17 (×3): qty 1

## 2019-09-17 MED ORDER — OMEGA 3 1000 MG PO CAPS: 1000.0000 mg | ORAL_CAPSULE | Freq: Every day | ORAL | Status: DC

## 2019-09-17 MED ORDER — SODIUM CHLORIDE 0.9 % IV SOLN
INTRAVENOUS | Status: DC
  Administered 2019-09-17: 08:00:00 via INTRAVENOUS

## 2019-09-17 MED ORDER — ACETAMINOPHEN 650 MG RE SUPP: 650.0000 mg | Freq: Four times a day (QID) | RECTAL | Status: DC | PRN

## 2019-09-17 MED ORDER — SODIUM CHLORIDE 0.9 % IV SOLN
INTRAVENOUS | Status: DC
  Administered 2019-09-17 – 2019-09-19 (×3): via INTRAVENOUS

## 2019-09-17 NOTE — ED Provider Notes (Signed)
[facility] DEPT Provider Note   CSN: [id number] Arrival date & time: 09/17/19  [id number]     History   Chief Complaint Chief Complaint  Patient presents with  . Rectal Bleeding    HPI Carol Mueller is a 58 y.o. female.     58 year old female with history of diverticulitis presents with left lower quadrant abdominal pain and bloody stools since this morning.  She has had nausea but no vomiting.  No fever or chills.  Pain is sharp and persistent.  Denies any urinary symptoms.  Does not take any blood thinners.  No treatment used prior to arrival     Past Medical History:  Diagnosis Date  . Anemia   . 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 Diabetes mellitus without complication (HCC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  . Diverticulitis   . Diverticulosis   . GI bleed   . Hyperlipidemia   . Hypertension   . Polycystic ovary disease   . Sepsis ([place]) 05/2019    Patient Active Problem List   Diagnosis Date Noted  . Fever   . Salmonella enteritis 05/28/2019  . 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 Sepsis ([place]) 05/24/2019  . Hypokalemia 06/02/2018  . Lower GI bleeding 06/02/2018  . Insulin-requiring or dependent type II diabetes mellitus ([place])   . Back pain with left-sided radiculopathy 09/29/2017  . Diarrhea 12/16/2016  . Lower GI bleed 11/30/2016  . History of GI diverticular bleed   . Diverticulosis of colon with hemorrhage 06/05/2016  . Ischemic colitis ([place]) 08/07/2015  . Abdominal pain   . Tachycardia 07/15/2015  . Polycystic disease, ovaries 09/12/2012  . Class 3 obesity due to excess calories with body mass index (BMI) of 40.0 to 44.9 in adult 09/12/2012  . Essential hypertension 09/12/2012  . Hyperlipidemia with target LDL less than 100 09/12/2012  . 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 ([place]) 09/12/2012  . Allergic rhinitis 04/06/2012    Past Surgical History:  Procedure Laterality Date  . ADRENALECTOMY  1996   . CHOLECYSTECTOMY    . ESOPHAGOGASTRODUODENOSCOPY (EGD) WITH PROPOFOL N/A 07/16/2015   Procedure: ESOPHAGOGASTRODUODENOSCOPY (EGD) WITH PROPOFOL;  Surgeon: [name], MD;  Location: [facility] ENDOSCOPY;  Service: Endoscopy;  Laterality: N/A;     OB History   No obstetric history on file.      Home Medications    Prior to Admission medications   Medication Sig Start Date End Date Taking? Authorizing Provider  atorvastatin (LIPITOR) 10 MG tablet Take 10 mg by mouth daily. 03/06/19   [provider]  carvedilol (COREG) 3.125 MG tablet Take 3.125 mg by mouth 2 (two) times daily with a meal.    [provider]  dicyclomine (BENTYL) 20 MG tablet TAKE 1 TABLET BY MOUTH TWICE A DAY Patient taking differently: Take 20 mg by mouth as needed (Stomach pain).  06/02/17   [name], MD  hydrochlorothiazide (HYDRODIURIL) 25 MG tablet Take 25 mg by mouth daily. 04/14/19   [provider]  Insulin Glargine (BASAGLAR KWIKPEN) 100 UNIT/ML SOPN Inject 30 Units into the skin daily. 05/15/19   [provider]  JARDIANCE 10 MG TABS tablet Take 10 mg by mouth daily. 03/15/19   [provider]  Lutein-Zeaxanthin 20-1 MG CAPS Take 1 capsule by mouth daily.    [provider]  metFORMIN (GLUCOPHAGE) 1000 MG tablet Take 1 tablet (1,000 mg total) by mouth 2 (two) times daily with a meal.  PATIENT NEEDS OFFICE VISIT FOR ADDITIONAL REFILLS Patient taking differently: Take 1,000 mg by mouth 2 (two) times daily with a meal.  09/07/13   Bailey Mech E, PA-C  Multiple Vitamins-Minerals (MULTIVITAMIN WITH MINERALS) tablet Take 1 tablet by mouth daily.    [provider]  olmesartan (BENICAR) 40 MG tablet Take 40 mg by mouth daily. 04/13/19   [provider]  Omega 3 1000 MG CAPS Take 1,000 mg by mouth daily.    [provider]  Specialty Vitamins Products (ADVANCED COLLAGEN PO) Take 1 scoop by mouth once a week.     [provider]     Family History Family History  Problem Relation Age of Onset  . Hypertension Mother   . Cancer Mother        ?uterine or ovarian cancer  . Hypertension Father   . Diabetes Father   . Hypertension Sister   . Diabetes Sister   . Hypertension Brother   . Diabetes Brother   . Hypertension Maternal Grandfather   . Diabetes Paternal Grandmother   . Breast cancer Maternal Grandmother   . Colon cancer Neg Hx     Social History Social History   Tobacco Use  . Smoking status: Never Smoker  . Smokeless tobacco: Never Used  Substance Use Topics  . Alcohol use: No  . Drug use: No     Allergies   Norvasc [amlodipine besylate] and Nsaids   Review of Systems Review of Systems  All other systems reviewed and are negative.    Physical Exam Updated Vital Signs BP 106/82   Pulse (!) 117   Temp 98.3 F (36.8 C) (Oral)   Resp (!) 24   Wt 118 kg   SpO2 99%   BMI 46.08 kg/m   Physical Exam Vitals signs and nursing note reviewed.  Constitutional:      General: She is not in acute distress.    Appearance: Normal appearance. She is well-developed. She is not toxic-appearing.  HENT:     Head: Normocephalic and atraumatic.  Eyes:     General: Lids are normal.     Conjunctiva/sclera: Conjunctivae normal.     Pupils: Pupils are equal, round, and reactive to light.  Neck:     Musculoskeletal: Normal range of motion and neck supple.     Thyroid: No thyroid mass.     Trachea: No tracheal deviation.  Cardiovascular:     Rate and Rhythm: Regular rhythm. Bradycardia present.     Heart sounds: Normal heart sounds. No murmur. No gallop.   Pulmonary:     Effort: Pulmonary effort is normal. No respiratory distress.     Breath sounds: Normal breath sounds. No stridor. No decreased breath sounds, wheezing, rhonchi or rales.  Abdominal:     General: Bowel sounds are normal. There is no distension.     Palpations: Abdomen is soft.     Tenderness: There is abdominal tenderness in the  left lower quadrant. There is no guarding or rebound.    Genitourinary:    Comments: Gross blood noted on digital exam Musculoskeletal: Normal range of motion.        General: No tenderness.  Skin:    General: Skin is warm and dry.     Findings: No abrasion or rash.  Neurological:     Mental Status: She is alert and oriented to person, place, and time.     GCS: GCS eye subscore is 4. GCS verbal subscore is 5. GCS motor subscore  is 6.     Cranial Nerves: No cranial nerve deficit.     Sensory: No sensory deficit.  Psychiatric:        Speech: Speech normal.        Behavior: Behavior normal.      ED Treatments / Results  Labs (all labs ordered are listed, but only abnormal results are displayed) Labs Reviewed  CBC WITH DIFFERENTIAL/PLATELET  COMPREHENSIVE METABOLIC PANEL  TYPE AND SCREEN    EKG None  Radiology No results found.  Procedures Procedures (including critical care time)  Medications Ordered in ED Medications  sodium chloride 0.9 % bolus 1,000 mL (has no administration in time range)  0.9 %  sodium chloride infusion (has no administration in time range)     Initial Impression / Assessment and Plan / ED Course  I have reviewed the triage vital signs and the nursing notes.  Pertinent labs & imaging results that were available during my care of the patient were reviewed by me and considered in my medical decision making (see chart for details).        Patient CT without evidence of diverticulitis but does show diverticulosis.  This is likely the source of her GI bleeding.  Does have some evidence of acute kidney injury.  Given IV fluids and will consult hospitalist for admission  Final Clinical Impressions(s) / ED Diagnoses   Final diagnoses:  None    ED Discharge Orders    None       Lacretia Leigh, MD 09/17/19 [phone]

## 2019-09-17 NOTE — Consult Note (Signed)
Gastroenterology Inpatient Consultation   Attending [place] [place] [place] [facility] Day: 1  Reason for Consult Rectal bleeding and Abdominal Pain   History of Present Illness  Carol Mueller is a 58 y.o. female with a pmh significant for Cushing syndrome, diabetes, diverticulosis, prior diverticulitis, hyperlipidemia, hypertension, PCOS, recent mesenteric vein thrombosis (nonocclusive).  The GI service is consulted for evaluation and management of rectal bleeding and abdominal pain.  The patient was in her usual state of health until last Sunday.  On Sunday she had a few episodes of rectal bleeding that eventually subsided over the course the next 36 hours.  She subsequently had recurrence of symptoms acutely this morning with significant lower abdominal pain.  She has had issues with abdominal pain previously but the pain is the worst it ever been.  She has been diagnosed in the past with diverticulitis as well as with diverticular hemorrhage requiring IR guided embolization in the past.  She has had at least 6-7 bowel movements since early this morning with the last being 2 hours ago.  The patient has had issues with presyncope but has not had a true fainting episode.  She denies any fevers or chills.  She has no muscle weakness but has generalized fatigue.  She is not taking significant amounts of ibuprofen or BC/Goody powders.  She has longer standing issues of loose stools for which she has seen Dr. [name] in the past.  She does not use fiber on a regular basis.  Patient's last colonoscopy was in 2014.  GI Review of Systems Positive as above early satiety, decreased appetite Negative for dysphagia, odynophagia, melena   Review of Systems  General: Denies fevers/chills HEENT: Denies oral lesions Cardiovascular: Denies chest pain Pulmonary: Mild shortness of breath present earlier in the day; denies cough Gastroenterological: See HPI Genitourinary: Denies  darkened urine Hematological: Denies easy bruising/bleeding Endocrine: Denies temperature intolerance Dermatological: Denies jaundice Psychological: Mood is anxious and scared   Histories  Past Medical History Past Medical History:  Diagnosis Date  . Anemia   . Cushing's syndrome ([place])    due to adrenal tumor  . Diabetes mellitus without complication (HCC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  . Diverticulitis   . Diverticulosis   . GI bleed   . Hyperlipidemia   . Hypertension   . Polycystic ovary disease   . Sepsis ([place]) 05/2019   Past Surgical History:  Procedure Laterality Date  . ADRENALECTOMY  1996  . CHOLECYSTECTOMY    . ESOPHAGOGASTRODUODENOSCOPY (EGD) WITH PROPOFOL N/A 07/16/2015   Procedure: ESOPHAGOGASTRODUODENOSCOPY (EGD) WITH PROPOFOL;  Surgeon: [name], MD;  Location: [facility];  Service: Endoscopy;  Laterality: N/A;    Allergies Allergies  Allergen Reactions  . Norvasc [Amlodipine Besylate] Swelling    ANGIOEDEMA  . Nsaids Other (See Comments)    GI upset/ GI bleed    Family History Family History  Problem Relation Age of Onset  . Hypertension Mother   . Cancer Mother        ?uterine or ovarian cancer  . Hypertension Father   . Diabetes Father   . Hypertension Sister   . Diabetes Sister   . Hypertension Brother   . Diabetes Brother   . Hypertension Maternal Grandfather   . Diabetes Paternal Grandmother   . Breast cancer Maternal Grandmother   . Colon cancer Neg Hx    The patient's FH is negative for IBD/IBS/Liver Disease/GI Malignancies.  Social History Social History   Socioeconomic  History  . Marital status: Single    Spouse name: N/A  . Number of children: 0  . Years of education: Not on file  . Highest education level: Not on file  Occupational History  . Occupation: NURSE    Comment: [employer]  Social Needs  . Financial resource strain: Not on file  . Food insecurity    Worry: Not on file    Inability: Not on file  . Transportation  needs    Medical: Not on file    Non-medical: Not on file  Tobacco Use  . Smoking status: Never Smoker  . Smokeless tobacco: Never Used  Substance and Sexual Activity  . Alcohol use: No  . Drug use: No  . Sexual activity: Not Currently    Birth control/protection: Post-menopausal  Lifestyle  . Physical activity    Days per week: Not on file    Minutes per session: Not on file  . Stress: Not on file  Relationships  . Social Herbalist on phone: Not on file    Gets together: Not on file    Attends religious service: Not on file    Active member of club or organization: Not on file    Attends meetings of clubs or organizations: Not on file    Relationship status: Not on file  . Intimate partner violence    Fear of current or ex partner: Not on file    Emotionally abused: Not on file    Physically abused: Not on file    Forced sexual activity: Not on file  Other Topics Concern  . Not on file  Social History Narrative   Lives alone. Parents live in [place].    Medications  Home Medications No current facility-administered medications on file prior to encounter.    Current Outpatient Medications on File Prior to Encounter  Medication Sig Dispense Refill  . acetaminophen (TYLENOL) 500 MG tablet Take 1,000 mg by mouth every 6 (six) hours as needed for mild pain or headache.    Marland Kitchen atorvastatin (LIPITOR) 10 MG tablet Take 10 mg by mouth daily.    . carvedilol (COREG) 3.125 MG tablet Take 3.125 mg by mouth 2 (two) times daily with a meal.    . cyclobenzaprine (FLEXERIL) 10 MG tablet Take 10 mg by mouth 3 (three) times daily as needed for muscle spasms.    Marland Kitchen dicyclomine (BENTYL) 20 MG tablet TAKE 1 TABLET BY MOUTH TWICE A DAY (Patient taking differently: Take 20 mg by mouth as needed (Stomach pain). ) 60 tablet 0  . hydrochlorothiazide (HYDRODIURIL) 25 MG tablet Take 25 mg by mouth daily.    . Insulin Glargine (BASAGLAR KWIKPEN) 100 UNIT/ML SOPN Inject 30 Units into  the skin daily.    Marland Kitchen JARDIANCE 10 MG TABS tablet Take 10 mg by mouth daily.    . Lutein-Zeaxanthin 20-1 MG CAPS Take 1 capsule by mouth daily.    . Magnesium Cl-Calcium Carbonate (SLOW MAGNESIUM/CALCIUM PO) Take 1 tablet by mouth daily.    . metFORMIN (GLUCOPHAGE) 1000 MG tablet Take 1 tablet (1,000 mg total) by mouth 2 (two) times daily with a meal. PATIENT NEEDS OFFICE VISIT FOR ADDITIONAL REFILLS (Patient taking differently: Take 1,000 mg by mouth 2 (two) times daily with a meal. ) 60 tablet 0  . Multiple Vitamins-Minerals (MULTIVITAMIN WITH MINERALS) tablet Take 1 tablet by mouth daily.    Marland Kitchen olmesartan (BENICAR) 40 MG tablet Take 40 mg by mouth daily.    Marland Kitchen  Omega 3 1000 MG CAPS Take 1,000 mg by mouth daily.     Scheduled Inpatient Medications . atorvastatin  10 mg Oral q1800  . carvedilol  3.125 mg Oral BID WC  . insulin aspart  0-15 Units Subcutaneous TID WC  . insulin glargine  15 Units Subcutaneous Q2200  . multivitamin with minerals  1 tablet Oral Daily  . omega-3 acid ethyl esters  1 g Oral Daily  . sodium chloride (PF)      . sodium chloride flush  3 mL Intravenous Q12H   Continuous Inpatient Infusions . sodium chloride 125 mL/hr at 09/17/19 0755  . sodium chloride 100 mL/hr at 09/17/19 1136   PRN Inpatient Medications acetaminophen **OR** acetaminophen, dicyclomine   Physical Examination  BP (!) 141/95 (BP Location: Left Arm)   Pulse 98   Temp 99.3 F (37.4 C) (Oral)   Resp 17   Wt 118 kg   SpO2 100%   BMI 46.08 kg/m  GEN: NAD, resting in bed comfortably, nontoxic PSYCH: Cooperative, without pressured speech EYE: Conjunctivae pink, sclerae anicteric ENT: MMM, without oral ulcers, no erythema or exudates noted NECK: Supple, enlarged neck girth CV: Nontachycardic RESP: Without adventitious breath sounds GI: NABS, soft, rounded, obese, tenderness to palpation noted in suprapubic region, no rebound GU: DRE deferred by patient MSK/EXT: Bilateral lower extremity  edema present SKIN: No jaundice NEURO:  Alert & Oriented x 3, no focal deficits   Review of Data  I reviewed the following data at the time of this encounter:  Laboratory Studies   Recent Labs  Lab 09/17/19 0753  NA 134*  K 4.5  CL 99  CO2 22  BUN 26*  CREATININE 1.55*  GLUCOSE 338*  CALCIUM 9.3   Recent Labs  Lab 09/17/19 0753  AST 16  ALT 22  ALKPHOS 45    Recent Labs  Lab 09/17/19 0753 09/17/19 1349  WBC 16.9*  --   HGB 11.7* 10.6*  HCT 39.1 34.2*  PLT 368  --    No results for input(s): APTT, INR in the last 168 hours. Computed MELD-Na score unavailable. Necessary lab results were not found in the last year. Computed MELD score unavailable. Necessary lab results were not found in the last year.  Imaging Studies  September 17, 2019 CT abdomen pelvis with contrast IMPRESSION: 1. No acute abnormality. 2. Stable diffuse hepatic steatosis. 3. Colonic diverticulosis. 4. Calcified uterine fibroids. 5. Small right inguinal and umbilical hernias containing fat.  July 2020 CT abdomen pelvis with contrast Abdomen and pelvis: 1. 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 2. Extensive colonic diverticulosis again noted. No evidence of acute diverticulitis. 3. Unchanged hepatic steatosis. 4. Unchanged enlarged fibroid uterus.  2017 CT angio abdomen/pelvis IMPRESSION: VASCULAR 1. Active diverticular bleed in the proximal descending colon just beyond the splenic flexure. 2. No significant atherosclerotic vascular disease. NON VASCULAR 1. Extensive colonic diverticulosis. No evidence of active diverticulitis. 2. At least moderate hepatic steatosis. 3. Simple renal cysts noted incidentally. 4. Fibroid uterus. 5. Surgical changes of prior cholecystectomy and right Adrenalectomy.   GI Procedures and Studies  2014 colonoscopy Moderate diverticulosis was noted in the  descending colon and sigmoid colon otherwise normal without polyps.   Assessment  Ms. Pingree is a 58 y.o. female with a pmh significant for Cushing syndrome, diabetes, diverticulosis, prior diverticulitis, hyperlipidemia, hypertension, PCOS, recent mesenteric vein thrombosis (nonocclusive).  The GI service is consulted for evaluation and management  of rectal bleeding and abdominal pain.  The patient is currently hemodynamically stable.  Based on the patient's presentation I think she is having a stuttering diverticular bleeding.  However an infectious etiology could be considered but seems less likely based on the overall history that she gives.  She has had prior diverticular bleeds as well so this lies to the likely that this is a recurrent bleed.  Because her last colonoscopy was greater than 6 years ago, I think it would be of benefit to consider an endoscopic evaluation to rule out other etiologies once infectious etiologies are ruled out.  However, the patient does not feel comfortable with undergoing sedation at this time but may consider as an outpatient if needed in the future.  She is willing to consider any test or colonoscopy if she continues to have bleeding however.  The risks and benefits of endoscopic evaluation were discussed with the patient; these include but are not limited to the risk of perforation, infection, bleeding, missed lesions, lack of diagnosis, severe illness requiring hospitalization, as well as anesthesia and sedation related illnesses.  The patient wants to think about the procedure before it is entertained.   Plan/Recommendations  She wants to hold on Colonoscopy. CLD Please give IV Iron (Feraheme 1 dose IV in the next few days If significant recurrent bleeding or large volume bleed then please send to Tagged RBC scan and then when results return please reach out to GI to discuss next steps. If hemodynamically unstable bleeding then resuscitate, send to ICU, and  likely send for CT-Angiography taking the chance of increased risk but more likely to give an answer Follow up Cx data to ensure not infectious but unlikely I think she will need a Colonoscopy but she doesn't want to go through currently Levsin or Bentyl OK   Thank you for this consult.  We will continue to follow.  Please page/call with questions or concerns.   Justice Britain, MD [facility] # [phone]

## 2019-09-17 NOTE — H&P (Addendum)
History and Physical    Carol Mueller XVQ:[id number] DOB: [birth year] DOA: 09/17/2019  PCP: [name], DO  Patient coming from: Home  I have personally briefly reviewed patient's old medical records in [place]  Chief Complaint: Diarrhea/BRBPR  HPI: Carol Mueller is a 58 y.o. female with medical history significant of hx of unilateral adrenalectomy for Cushing's syndrome, T2DM/IDDM, HTN, HLD and chronic diarrhea who presents with abdominal discomfort, increased frequency of diarrhea and BRBPR since this morning.  Patient reports she was in [place] thru this morning.  She reports at baseline she has chronic diarrhea, and rarely gets bleeding in her stools.  She states she usually has bleeding approximately once a year but last couple of years it has been 6 months apart.  She reports her most recent episode was one week prior, initially bright red blood mixed with her stools and then it became dark and ceased.  She states this past week thru last night was fine.  She states she did have some lower abdominal pain prior to last Sunday.  She has been admitted in the past for observation for similar complaints but reports she has not had a colonoscopy for several years and previously followed with Dr. [name] (GI) but has not been seen in a couple of years.  She presents today for episodes of diarrhea and BRBPR.  She states she had abrupt onset of abdominal pain this morning around 5 am, prompting her to go to the restroom and she states it was all blood without stool.  She states she has had 7 episodes since this morning.  She denies any black stools.  She did feel nauseated but states it improved after reglan in ER.  She states she has not had any emesis.  She denies any fevers, chills.  She has not had any urinary symptoms.  No cough, no fevers.  She drove herself here and reports some dizziness and shortness of breath.  She states she lives independently and has not been around anyone sick.   She states she has not seen anyone in a week except for grocery store visit.  Denies any known COVID-19 exposures.  No loss of smell or taste.  No hx of hyperthyroidism.  She states she has diarrhea approximately half the year.  She states this has been ongoing for years.  She denies formal work-up and has not seen GI for a few years.  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  She feels her metformin has contributed to her presentation in the past and has varied her dosing with some improvement in symptoms.  She does not take any NSAIDs, no Aspirin.  She has no hx of C. Diff.  She has not been on antibiotics since July.  No other hx of immunocompromise.  No hx of unexpected weight loss, fevers/chills.  No family hx of colon cancer or other malignancy.  She lives independently.  She is independent in ADLs. She works as an Therapist, sports. No tobacco or alcohol use.  She has not taken her meds this morning except for Bentyl.   Review of Systems: As per HPI otherwise 10 point review of systems negative.   Past Medical History:  Diagnosis Date  . Anemia   . Cushing's syndrome ([place])    due to adrenal tumor  . Diabetes mellitus without complication (HCC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  .  Diverticulitis   . Diverticulosis   . GI bleed   . Hyperlipidemia   . Hypertension   . Polycystic ovary disease   . Sepsis ([place]) 05/2019    Past Surgical History:  Procedure Laterality Date  . ADRENALECTOMY  1996  . CHOLECYSTECTOMY    . ESOPHAGOGASTRODUODENOSCOPY (EGD) WITH PROPOFOL N/A 07/16/2015   Procedure: ESOPHAGOGASTRODUODENOSCOPY (EGD) WITH PROPOFOL;  Surgeon: [name], MD;  Location: [facility];  Service: Endoscopy;  Laterality: N/A;     reports that she has never smoked. She has never used smokeless tobacco. She reports that she does not drink alcohol or use drugs.  Allergies   Allergen Reactions  . Norvasc [Amlodipine Besylate] Swelling    ANGIOEDEMA  . Nsaids Other (See Comments)    GI upset/ GI bleed    Family History  Problem Relation Age of Onset  . Hypertension Mother   . Cancer Mother        ?uterine or ovarian cancer  . Hypertension Father   . Diabetes Father   . Hypertension Sister   . Diabetes Sister   . Hypertension Brother   . Diabetes Brother   . Hypertension Maternal Grandfather   . Diabetes Paternal Grandmother   . Breast cancer Maternal Grandmother   . Colon cancer Neg Hx      Prior to Admission medications   Medication Sig Start Date End Date Taking? Authorizing Provider  atorvastatin (LIPITOR) 10 MG tablet Take 10 mg by mouth daily. 03/06/19   [provider]  carvedilol (COREG) 3.125 MG tablet Take 3.125 mg by mouth 2 (two) times daily with a meal.    [provider]  cyclobenzaprine (FLEXERIL) 10 MG tablet Take 10 mg by mouth 3 (three) times daily as needed for muscle spasms. 09/14/19   [provider]  dicyclomine (BENTYL) 20 MG tablet TAKE 1 TABLET BY MOUTH TWICE A DAY Patient taking differently: Take 20 mg by mouth as needed (Stomach pain).  06/02/17   [name], MD  hydrochlorothiazide (HYDRODIURIL) 25 MG tablet Take 25 mg by mouth daily. 04/14/19   [provider]  Insulin Glargine (BASAGLAR KWIKPEN) 100 UNIT/ML SOPN Inject 30 Units into the skin daily. 05/15/19   [provider]  JARDIANCE 10 MG TABS tablet Take 10 mg by mouth daily. 03/15/19   [provider]  Lutein-Zeaxanthin 20-1 MG CAPS Take 1 capsule by mouth daily.    [provider]  metFORMIN (GLUCOPHAGE) 1000 MG tablet Take 1 tablet (1,000 mg total) by mouth 2 (two) times daily with a meal. PATIENT NEEDS OFFICE VISIT FOR ADDITIONAL REFILLS Patient taking differently: Take 1,000 mg by mouth 2 (two) times daily with a meal.  09/07/13   Bailey Mech E, PA-C  Multiple Vitamins-Minerals (MULTIVITAMIN WITH  MINERALS) tablet Take 1 tablet by mouth daily.    [provider]  olmesartan (BENICAR) 40 MG tablet Take 40 mg by mouth daily. 04/13/19   [provider]  Omega 3 1000 MG CAPS Take 1,000 mg by mouth daily.    [provider]  Specialty Vitamins Products (ADVANCED COLLAGEN PO) Take 1 scoop by mouth once a week.     [provider]    Physical Exam: Vitals:   09/17/19 0715 09/17/19 0800  BP: 106/82 100/60  Pulse: (!) 117 99  Resp: (!) 24 18  Temp: 98.3 F (36.8 C)   TempSrc: Oral   SpO2: 99% 98%  Weight: 118 kg     Constitutional: NAD, calm,  comfortable Vitals:   09/17/19 0715 09/17/19 0800  BP: 106/82 100/60  Pulse: (!) 117 99  Resp: (!) 24 18  Temp: 98.3 F (36.8 C)   TempSrc: Oral   SpO2: 99% 98%  Weight: 118 kg    Eyes: PERRL, EOMI, exophthalmos ENMT: Mucous membranes are moist. Posterior pharynx clear of any exudate or lesions.Normal dentition.  Neck: normal, supple, no masses, no thyromegaly Respiratory: clear to auscultation bilaterally, no wheezing, no crackles. Normal respiratory effort. No accessory muscle use.  Cardiovascular: Regular rate and rhythm, no murmurs / rubs / gallops. No extremity edema. 2+ pedal pulses. Abdomen: diffusely tender, more so in lower quadrants, no rebound or guarding Musculoskeletal: no clubbing / cyanosis. No joint deformity upper and lower extremities. Good ROM, no contractures. Normal muscle tone.  Skin: no rashes, lesions, ulcers. No induration Neurologic: CN grossly intact, no gross focal motor or sensory deficits. Psychiatric: Normal judgment and insight. Alert and oriented x 3. Normal mood.   Labs on Admission: I have personally reviewed following labs and imaging studies  CBC: Recent Labs  Lab 09/17/19 0753  WBC 16.9*  NEUTROABS 12.2*  HGB 11.7*  HCT 39.1  MCV 72.9*  PLT 782   Basic Metabolic Panel: Recent Labs  Lab 09/17/19 0753  NA 134*  K 4.5  CL 99  CO2 22  GLUCOSE 338*   BUN 26*  CREATININE 1.55*  CALCIUM 9.3   GFR: Estimated Creatinine Clearance: 48.5 mL/min (A) (by C-G formula based on SCr of 1.55 mg/dL (H)). Liver Function Tests: Recent Labs  Lab 09/17/19 0753  AST 16  ALT 22  ALKPHOS 45  BILITOT 1.1  PROT 7.6  ALBUMIN 4.1   No results for input(s): LIPASE, AMYLASE in the last 168 hours. No results for input(s): AMMONIA in the last 168 hours. Coagulation Profile: No results for input(s): INR, PROTIME in the last 168 hours. Cardiac Enzymes: No results for input(s): CKTOTAL, CKMB, CKMBINDEX, TROPONINI in the last 168 hours. BNP (last 3 results) No results for input(s): PROBNP in the last 8760 hours. HbA1C: No results for input(s): HGBA1C in the last 72 hours. CBG: No results for input(s): GLUCAP in the last 168 hours. Lipid Profile: No results for input(s): CHOL, HDL, LDLCALC, TRIG, CHOLHDL, LDLDIRECT in the last 72 hours. Thyroid Function Tests: No results for input(s): TSH, T4TOTAL, FREET4, T3FREE, THYROIDAB in the last 72 hours. Anemia Panel: No results for input(s): VITAMINB12, FOLATE, FERRITIN, TIBC, IRON, RETICCTPCT in the last 72 hours. Urine analysis:    Component Value Date/Time   COLORURINE YELLOW 05/24/2019 1430   APPEARANCEUR HAZY (A) 05/24/2019 1430   LABSPEC 1.020 05/24/2019 1430   PHURINE 5.0 05/24/2019 1430   GLUCOSEU >=500 (A) 05/24/2019 1430   HGBUR NEGATIVE 05/24/2019 1430   BILIRUBINUR NEGATIVE 05/24/2019 1430   BILIRUBINUR neg 08/25/2014 1157   KETONESUR 5 (A) 05/24/2019 1430   PROTEINUR NEGATIVE 05/24/2019 1430   UROBILINOGEN 0.2 08/25/2014 1157   NITRITE NEGATIVE 05/24/2019 1430   LEUKOCYTESUR NEGATIVE 05/24/2019 1430    Radiological Exams on Admission: Ct Abdomen Pelvis W Contrast  Result Date: 09/17/2019 CLINICAL DATA:  Lower pelvic pain and diarrhea since this morning. EXAM: CT ABDOMEN AND PELVIS WITH CONTRAST TECHNIQUE: Multidetector CT imaging of the abdomen and pelvis was performed using the  standard protocol following bolus administration of intravenous contrast. CONTRAST:  52m OMNIPAQUE IOHEXOL 300 MG/ML  SOLN COMPARISON:  06/02/2018 . FINDINGS: Lower chest: Unremarkable. Hepatobiliary: Diffuse low density of the liver relative to the spleen is  again demonstrated. Cholecystectomy clips. Pancreas: Unremarkable. No pancreatic ductal dilatation or surrounding inflammatory changes. Spleen: Normal in size without focal abnormality. Adrenals/Urinary Tract: Surgically absent right adrenal gland with surgical clips. Normal appearing left adrenal gland. Bilateral simple appearing renal cysts. Unremarkable urinary bladder and ureters. Stomach/Bowel: Large number of colonic diverticula without evidence of diverticulitis. Normal appearing rectum. Normal appearing appendix, small bowel and stomach. Vascular/Lymphatic: No significant vascular findings are present. No enlarged abdominal or pelvic lymph nodes. Reproductive: Multiple calcified uterine fibroids are again noted. No adnexal mass. Other: Small right inguinal hernia containing fat. Small umbilical hernia containing fat. Musculoskeletal: Lumbar and lower thoracic spine degenerative changes. IMPRESSION: 1. No acute abnormality. 2. Stable diffuse hepatic steatosis. 3. Colonic diverticulosis. 4. Calcified uterine fibroids. 5. Small right inguinal and umbilical hernias containing fat. Electronically Signed   By: Claudie Revering M.D.   On: 09/17/2019 09:16     Assessment/Plan  Carol Mueller is a 58 y.o. female with medical history significant of hx of unilateral adrenalectomy for Cushing's syndrome, T2DM/IDDM, HTN, HLD and chronic diarrhea who presents with abdominal discomfort, increased frequency of diarrhea and BRBPR concerning for LGIB.  # Acute LGIB # Acute on chronic diarrhea # SIRS - Differential - Diverticular bleed (vs. Diverticulitis) vs Hemorrhoids vs Ischemic colitis vs infectious colitis vs malignancy - patient with recent bloody BM's,  Hgb at this time is stable and at baseline (11.7, recent baseline 11-12) - SIRS criteria met but suspect 2/2 dehydration and leukocytosis is stress mediated endogenous steroid release - patient's most recent colonoscopy is in 12/2012, at that time there were findings of moderate diverticulosis and internal hemorrhoids but no active bleed.  In July of this year, she had fevers in addition to similar symptoms and was treated with cipro for presumptive salmonella enteritis - low suspicion for C. Diff (suspect leukocytosis is stress mediated demargination) but will obtain sample, will check stool studies as well to evaluate other infectious etiologies and eval of chronic diarrhea - CT abdomen and pelvis obtained - no diverticulitis, nor other intrabdominal process noted - re-check TSH - COVID screen pending (but do not suspect GI symptoms related) - continue to trend H/H - CLD, continue mIVF for now and NPO midnight pending possible need for colonoscopy - types and screened, consented - hold blood thinners/aspirin, not on NSAIDs - Spoke with NP Kennedy-Smith with [facility], will consult.  Will consider tagged RBC scan if ongoing bleed (has AKI at present).  Dr. Rush Landmark will staff later today.  # AKI on CKD 3 - Cr 1.55 (baseline 07.-0.8) - suspect pre-renal in setting of diarrhea, diminished intake - continue IVF, CLD - held HCTZ and Olmesartan - monitor I/O and renally dose meds  # Leukocytosis - suspect stress demargination vs infectious colitis but without any other infectious symptoms at this time - monitor off of antibiotics  # Morbid Obesity # Hepatic steatosis - BMI 46.08 - noted on imaging, weight loss - obesity affects all aspects of care  # T2DM/IDDM - last A1c is 9.1 (05/25/2019) - continue glargine dose-reduced - ISS - held oral agents  # HTN - continue carvedilol, hold olmesartan and HCTZ  # HLD - continue atorvastatin   DVT prophylaxis: SCDs Code Status: Full  Emergency Contact: Mother - Delayni Streed - [phone] Disposition Plan: Obs - anticipate d/c home Consults called: [facility] - awaiting call back   Truddie Hidden MD [facility] Pager [phone]  If 7PM-7AM, please contact night-coverage [email or web address] Password [facility]  09/17/2019, 9:47  AM    

## 2019-09-17 NOTE — ED Triage Notes (Signed)
Pt states she has had 6 bloody stools in approximately 2 hours.  Pt states that it is bright red blood. Pt states that her stomach is cramping.

## 2019-09-18 ENCOUNTER — Telehealth: Payer: Self-pay

## 2019-09-18 DIAGNOSIS — E282 Polycystic ovarian syndrome: Secondary | ICD-10-CM | POA: Diagnosis present

## 2019-09-18 DIAGNOSIS — K625 Hemorrhage of anus and rectum: Secondary | ICD-10-CM | POA: Diagnosis present

## 2019-09-18 DIAGNOSIS — E869 Volume depletion, unspecified: Secondary | ICD-10-CM | POA: Diagnosis not present

## 2019-09-18 DIAGNOSIS — Z8249 Family history of ischemic heart disease and other diseases of the circulatory system: Secondary | ICD-10-CM | POA: Diagnosis not present

## 2019-09-18 DIAGNOSIS — I129 Hypertensive chronic kidney disease with stage 1 through stage 4 chronic kidney disease, or unspecified chronic kidney disease: Secondary | ICD-10-CM | POA: Diagnosis present

## 2019-09-18 DIAGNOSIS — Z20828 Contact with and (suspected) exposure to other viral communicable diseases: Secondary | ICD-10-CM | POA: Diagnosis present

## 2019-09-18 DIAGNOSIS — K5731 Diverticulosis of large intestine without perforation or abscess with bleeding: Secondary | ICD-10-CM | POA: Diagnosis present

## 2019-09-18 DIAGNOSIS — D62 Acute posthemorrhagic anemia: Secondary | ICD-10-CM

## 2019-09-18 DIAGNOSIS — Z794 Long term (current) use of insulin: Secondary | ICD-10-CM | POA: Diagnosis not present

## 2019-09-18 DIAGNOSIS — N179 Acute kidney failure, unspecified: Secondary | ICD-10-CM

## 2019-09-18 DIAGNOSIS — K922 Gastrointestinal hemorrhage, unspecified: Secondary | ICD-10-CM | POA: Diagnosis not present

## 2019-09-18 DIAGNOSIS — Z833 Family history of diabetes mellitus: Secondary | ICD-10-CM | POA: Diagnosis not present

## 2019-09-18 DIAGNOSIS — E1122 Type 2 diabetes mellitus with diabetic chronic kidney disease: Secondary | ICD-10-CM | POA: Diagnosis present

## 2019-09-18 DIAGNOSIS — E785 Hyperlipidemia, unspecified: Secondary | ICD-10-CM | POA: Diagnosis present

## 2019-09-18 DIAGNOSIS — E119 Type 2 diabetes mellitus without complications: Secondary | ICD-10-CM | POA: Diagnosis not present

## 2019-09-18 DIAGNOSIS — K760 Fatty (change of) liver, not elsewhere classified: Secondary | ICD-10-CM | POA: Diagnosis present

## 2019-09-18 DIAGNOSIS — R6510 Systemic inflammatory response syndrome (SIRS) of non-infectious origin without acute organ dysfunction: Secondary | ICD-10-CM | POA: Diagnosis present

## 2019-09-18 DIAGNOSIS — Z6841 Body Mass Index (BMI) 40.0 and over, adult: Secondary | ICD-10-CM | POA: Diagnosis not present

## 2019-09-18 DIAGNOSIS — Z886 Allergy status to analgesic agent status: Secondary | ICD-10-CM | POA: Diagnosis not present

## 2019-09-18 DIAGNOSIS — N183 Chronic kidney disease, stage 3 unspecified: Secondary | ICD-10-CM | POA: Diagnosis present

## 2019-09-18 DIAGNOSIS — Z888 Allergy status to other drugs, medicaments and biological substances status: Secondary | ICD-10-CM | POA: Diagnosis not present

## 2019-09-18 DIAGNOSIS — Z803 Family history of malignant neoplasm of breast: Secondary | ICD-10-CM | POA: Diagnosis not present

## 2019-09-18 LAB — CBC WITH DIFFERENTIAL/PLATELET
Abs Immature Granulocytes: 0.07 10*3/uL (ref 0.00–0.07)
Basophils Absolute: 0 10*3/uL (ref 0.0–0.1)
Basophils Relative: 0 %
Eosinophils Absolute: 0.4 10*3/uL (ref 0.0–0.5)
Eosinophils Relative: 3 %
HCT: 28.1 % — ABNORMAL LOW (ref 36.0–46.0)
Hemoglobin: 8.5 g/dL — ABNORMAL LOW (ref 12.0–15.0)
Immature Granulocytes: 1 %
Lymphocytes Relative: 27 %
Lymphs Abs: 3.1 10*3/uL (ref 0.7–4.0)
MCH: 21.8 pg — ABNORMAL LOW (ref 26.0–34.0)
MCHC: 30.2 g/dL (ref 30.0–36.0)
MCV: 72.1 fL — ABNORMAL LOW (ref 80.0–100.0)
Monocytes Absolute: 0.9 10*3/uL (ref 0.1–1.0)
Monocytes Relative: 8 %
Neutro Abs: 6.9 10*3/uL (ref 1.7–7.7)
Neutrophils Relative %: 61 %
Platelets: 264 10*3/uL (ref 150–400)
RBC: 3.9 MIL/uL (ref 3.87–5.11)
RDW: 16.8 % — ABNORMAL HIGH (ref 11.5–15.5)
WBC: 11.4 10*3/uL — ABNORMAL HIGH (ref 4.0–10.5)
nRBC: 0 % (ref 0.0–0.2)

## 2019-09-18 LAB — COMPREHENSIVE METABOLIC PANEL
ALT: 17 U/L (ref 0–44)
AST: 13 U/L — ABNORMAL LOW (ref 15–41)
Albumin: 3.5 g/dL (ref 3.5–5.0)
Alkaline Phosphatase: 37 U/L — ABNORMAL LOW (ref 38–126)
Anion gap: 9 (ref 5–15)
BUN: 19 mg/dL (ref 6–20)
CO2: 22 mmol/L (ref 22–32)
Calcium: 8.6 mg/dL — ABNORMAL LOW (ref 8.9–10.3)
Chloride: 106 mmol/L (ref 98–111)
Creatinine, Ser: 0.91 mg/dL (ref 0.44–1.00)
GFR calc Af Amer: >60 mL/min (ref >60)
GFR calc non Af Amer: >60 mL/min (ref >60)
Glucose, Bld: 145 mg/dL — ABNORMAL HIGH (ref 70–99)
Potassium: 3.8 mmol/L (ref 3.5–5.1)
Sodium: 137 mmol/L (ref 135–145)
Total Bilirubin: 1 mg/dL (ref 0.3–1.2)
Total Protein: 6.4 g/dL — ABNORMAL LOW (ref 6.5–8.1)

## 2019-09-18 LAB — GLUCOSE, CAPILLARY
Glucose-Capillary: 111 mg/dL — ABNORMAL HIGH (ref 70–99)
Glucose-Capillary: 167 mg/dL — ABNORMAL HIGH (ref 70–99)
Glucose-Capillary: 105 mg/dL — ABNORMAL HIGH (ref 70–99)
Glucose-Capillary: 85 mg/dL (ref 70–99)

## 2019-09-18 LAB — RENAL FUNCTION PANEL
Albumin: 3.4 g/dL — ABNORMAL LOW (ref 3.5–5.0)
Anion gap: 8 (ref 5–15)
BUN: 18 mg/dL (ref 6–20)
CO2: 22 mmol/L (ref 22–32)
Calcium: 8.4 mg/dL — ABNORMAL LOW (ref 8.9–10.3)
Chloride: 105 mmol/L (ref 98–111)
Creatinine, Ser: 0.88 mg/dL (ref 0.44–1.00)
GFR calc Af Amer: >60 mL/min (ref >60)
GFR calc non Af Amer: >60 mL/min (ref >60)
Glucose, Bld: 143 mg/dL — ABNORMAL HIGH (ref 70–99)
Phosphorus: 3 mg/dL (ref 2.5–4.6)
Potassium: 3.8 mmol/L (ref 3.5–5.1)
Sodium: 135 mmol/L (ref 135–145)

## 2019-09-18 LAB — HEMATOCRIT
HCT: 28 % — ABNORMAL LOW (ref 36.0–46.0)
HCT: 25 % — ABNORMAL LOW (ref 36.0–46.0)

## 2019-09-18 LAB — LACTIC ACID, PLASMA: Lactic Acid, Venous: 0.8 mmol/L (ref 0.5–1.9)

## 2019-09-18 LAB — HEMOGLOBIN
Hemoglobin: 8.4 g/dL — ABNORMAL LOW (ref 12.0–15.0)
Hemoglobin: 7.6 g/dL — ABNORMAL LOW (ref 12.0–15.0)

## 2019-09-18 LAB — MAGNESIUM: Magnesium: 1.7 mg/dL (ref 1.7–2.4)

## 2019-09-18 MED ORDER — SODIUM CHLORIDE 0.9 % IV SOLN
510.0000 mg | Freq: Once | INTRAVENOUS | Status: AC
  Administered 2019-09-18: 510 mg via INTRAVENOUS
  Filled 2019-09-18: qty 17

## 2019-09-18 NOTE — Progress Notes (Signed)
PROGRESS NOTE    Carol Mueller  [id number] DOB: [birth year] DOA: 09/17/2019 PCP: [name], DO  Outpatient Specialists:   Brief Narrative: Patient is a 58 year old African-American female with past medical history significant for diverticulosis, status post unilateral adrenalectomy for Cushing's syndrome, type 2 diabetes mellitus, hypertension, hyperlipidemia and chronic diarrhea.  Patient was admitted with bright red blood per rectum.  The rectal bleed was severe to cause symptoms.  Patient is currently being managed with assistance from the gastrointestinal team.  09/18/2019: No bleeding reported today.  GI input is appreciated.  Patient was initially opposed to colonoscopy.  Patient and swelling to discuss colonoscopy, communicated to the GI team.  Possible outpatient colonoscopy as per GI team's note.   Assessment & Plan:   Active Problems:   Lower GI bleed   Rectal bleed  Rectal bleed: -Suspect diverticular bleed. -Continue to monitor history history. -Conservative management for now. -Continue to monitor vital signs. -Likely discharge in the next 1 to 2 days if no further bleeding is noted. -GI input is highly appreciated.  For possible outpatient colonoscopy. -Baseline hemoglobin seems to be around 11 to 12 g/dL. -Hemoglobin on presentation was 11.7 g/dL.  Last hemoglobin check was 8.5 g/dL.  Continue to monitor H/H. -For further work-up on an inpatient basis if bleeding recurs.  AKI:  -Likely prerenal.   -Acute kidney injury has resolved.   -Optimize patient's volume.   -Continue to hold nephrotoxic medications.    Leukocytosis -Likely reactive.   -Continue to monitor.    Morbid Obesity: -Further management on outpatient basis.  Hepatic steatosis -Likely related to body habitus. -BMI 46.08 -Follow-up with GI on discharge.  Weight loss and statins.  T2DM/IDDM: - last A1c is 9.1 (05/25/2019) - continue glargine dose-reduced - ISS  HTN:: - continue  carvedilol, hold olmesartan and HCTZ -Stable.  HLD - continue atorvastatin  DVT prophylaxis: SCD. Code Status: Full code. Family Communication:  Disposition Plan: Home eventually   Consultants:   GI  Procedures:   None  Antimicrobials:   None   Subjective: No further rectal bleeding reported  Objective: Vitals:   09/17/19 1059 09/17/19 2220 09/18/19 0602 09/18/19 1305  BP: (!) 141/95 130/68 117/73 120/70  Pulse: 98 89 88 93  Resp: 17 18 18 16   Temp: 99.3 F (37.4 C) 100 F (37.8 C) 99.3 F (37.4 C) 99.1 F (37.3 C)  TempSrc: Oral Oral Oral Oral  SpO2: 100% 98% 98% 99%  Weight:   117 kg     Intake/Output Summary (Last 24 hours) at 09/18/2019 1708 Last data filed at 09/18/2019 1651 Gross per 24 hour  Intake 3560.21 ml  Output 2050 ml  Net 1510.21 ml   Filed Weights   09/17/19 0715 09/18/19 0602  Weight: 118 kg 117 kg    Examination:  General exam: Appears calm and comfortable.  Patient is morbidly obese.  Patient is pale. Respiratory system: Clear to auscultation.  Cardiovascular system: S1 & S2 heard Gastrointestinal system: Abdomen is obese, soft and nontender.  Organs are difficult to assess.   Central nervous system: Alert and oriented.  Patient moves all extremities. Extremities: No leg edema.  Data Reviewed: I have personally reviewed following labs and imaging studies  CBC: Recent Labs  Lab 09/17/19 0753 09/17/19 1349 09/17/19 2130 09/18/19 0558 09/18/19 1407  WBC 16.9*  --   --  11.4*  --   NEUTROABS 12.2*  --   --  6.9  --   HGB 11.7* 10.6* 9.0* 8.5*  8.4*  HCT 39.1 34.2* 29.9* 28.1* 28.0*  MCV 72.9*  --   --  72.1*  --   PLT 368  --   --  264  --    Basic Metabolic Panel: Recent Labs  Lab 09/17/19 0753 09/18/19 0558  NA 134* 135   137  K 4.5 3.8   3.8  CL 99 105   106  CO2 22 22   22   GLUCOSE 338* 143*   145*  BUN 26* 18   19  CREATININE 1.55* 0.88   0.91  CALCIUM 9.3 8.4*   8.6*  MG  --  1.7  PHOS  --  3.0    GFR: Estimated Creatinine Clearance: 82.2 mL/min (by C-G formula based on SCr of 0.91 mg/dL). Liver Function Tests: Recent Labs  Lab 09/17/19 0753 09/18/19 0558  AST 16 13*  ALT 22 17  ALKPHOS 45 37*  BILITOT 1.1 1.0  PROT 7.6 6.4*  ALBUMIN 4.1 3.4*   3.5   No results for input(s): LIPASE, AMYLASE in the last 168 hours. No results for input(s): AMMONIA in the last 168 hours. Coagulation Profile: No results for input(s): INR, PROTIME in the last 168 hours. Cardiac Enzymes: No results for input(s): CKTOTAL, CKMB, CKMBINDEX, TROPONINI in the last 168 hours. BNP (last 3 results) No results for input(s): PROBNP in the last 8760 hours. HbA1C: No results for input(s): HGBA1C in the last 72 hours. CBG: Recent Labs  Lab 09/17/19 1631 09/17/19 1955 09/18/19 0728 09/18/19 1130 09/18/19 1645  GLUCAP 125* 111* 167* 105* 85   Lipid Profile: No results for input(s): CHOL, HDL, LDLCALC, TRIG, CHOLHDL, LDLDIRECT in the last 72 hours. Thyroid Function Tests: Recent Labs    09/17/19 1349  TSH 0.905   Anemia Panel: No results for input(s): VITAMINB12, FOLATE, FERRITIN, TIBC, IRON, RETICCTPCT in the last 72 hours. Urine analysis:    Component Value Date/Time   COLORURINE YELLOW 05/24/2019 1430   APPEARANCEUR HAZY (A) 05/24/2019 1430   LABSPEC 1.020 05/24/2019 1430   PHURINE 5.0 05/24/2019 1430   GLUCOSEU >=500 (A) 05/24/2019 1430   HGBUR NEGATIVE 05/24/2019 1430   BILIRUBINUR NEGATIVE 05/24/2019 1430   BILIRUBINUR neg 08/25/2014 1157   KETONESUR 5 (A) 05/24/2019 1430   PROTEINUR NEGATIVE 05/24/2019 1430   UROBILINOGEN 0.2 08/25/2014 1157   NITRITE NEGATIVE 05/24/2019 1430   LEUKOCYTESUR NEGATIVE 05/24/2019 1430   Sepsis Labs: @LABRCNTIP (procalcitonin:4,lacticidven:4)  ) Recent Results (from the past 240 hour(s))  SARS CORONAVIRUS 2 (TAT 6-24 HRS) Nasopharyngeal Nasopharyngeal Swab     Status: None   Collection Time: 09/17/19  9:45 AM   Specimen: Nasopharyngeal Swab   Result Value Ref Range Status   SARS Coronavirus 2 NEGATIVE NEGATIVE Final    Comment: (NOTE) SARS-CoV-2 target nucleic acids are NOT DETECTED. The SARS-CoV-2 RNA is generally detectable in upper and lower respiratory specimens during the acute phase of infection. Negative results do not preclude SARS-CoV-2 infection, do not rule out co-infections with other pathogens, and should not be used as the sole basis for treatment or other patient management decisions. Negative results must be combined with clinical observations, patient history, and epidemiological information. The expected result is Negative. Fact Sheet for Patients: SugarRoll.be Fact Sheet for Healthcare Providers: https://www.woods-mathews.com/ This test is not yet approved or cleared by the [place] FDA and  has been authorized for detection and/or diagnosis of SARS-CoV-2 by FDA under an Emergency Use Authorization (EUA). This EUA will remain  in effect (meaning this test can  be used) for the duration of the COVID-19 declaration under Section 56 4(b)(1) of the Act, 21 U.S.C. section 360bbb-3(b)(1), unless the authorization is terminated or revoked sooner. Performed at [facility], [place] [address], [place], [place] [address]          Radiology Studies: Ct Abdomen Pelvis W Contrast  Result Date: 09/17/2019 CLINICAL DATA:  Lower pelvic pain and diarrhea since this morning. EXAM: CT ABDOMEN AND PELVIS WITH CONTRAST TECHNIQUE: Multidetector CT imaging of the abdomen and pelvis was performed using the standard protocol following bolus administration of intravenous contrast. CONTRAST:  29mL OMNIPAQUE IOHEXOL 300 MG/ML  SOLN COMPARISON:  06/02/2018 . FINDINGS: Lower chest: Unremarkable. Hepatobiliary: Diffuse low density of the liver relative to the spleen is again demonstrated. Cholecystectomy clips. Pancreas: Unremarkable. No pancreatic ductal dilatation or surrounding  inflammatory changes. Spleen: Normal in size without focal abnormality. Adrenals/Urinary Tract: Surgically absent right adrenal gland with surgical clips. Normal appearing left adrenal gland. Bilateral simple appearing renal cysts. Unremarkable urinary bladder and ureters. Stomach/Bowel: Large number of colonic diverticula without evidence of diverticulitis. Normal appearing rectum. Normal appearing appendix, small bowel and stomach. Vascular/Lymphatic: No significant vascular findings are present. No enlarged abdominal or pelvic lymph nodes. Reproductive: Multiple calcified uterine fibroids are again noted. No adnexal mass. Other: Small right inguinal hernia containing fat. Small umbilical hernia containing fat. Musculoskeletal: Lumbar and lower thoracic spine degenerative changes. IMPRESSION: 1. No acute abnormality. 2. Stable diffuse hepatic steatosis. 3. Colonic diverticulosis. 4. Calcified uterine fibroids. 5. Small right inguinal and umbilical hernias containing fat. Electronically Signed   By: Claudie Revering M.D.   On: 09/17/2019 09:16        Scheduled Meds:  atorvastatin  10 mg Oral q1800   carvedilol  3.125 mg Oral BID WC   insulin aspart  0-15 Units Subcutaneous TID WC   insulin glargine  15 Units Subcutaneous Q2200   multivitamin with minerals  1 tablet Oral Daily   omega-3 acid ethyl esters  1 g Oral Daily   sodium chloride flush  3 mL Intravenous Q12H   Continuous Infusions:  sodium chloride 125 mL/hr at 09/17/19 0755   sodium chloride 100 mL/hr at 09/18/19 0600     LOS: 0 days    Time spent: 35 minutes.    [name], MD  [facility] Pager #: [phone] 7PM-7AM contact night coverage as above

## 2019-09-18 NOTE — Telephone Encounter (Signed)
-----   Message from [name], MD sent at 09/18/2019  2:02 PM EST ----- Regarding: RE: out pt colon Thanks, We'll get her in for OV to discuss her hospital course.   Elexa Kivi, She needs next available ROV with me.  Thanks  ----- Message ----- From: Jackquline Denmark, MD Sent: 09/18/2019   1:49 PM EST To: Alfredia Ferguson, PA-C, [name], MD Subject: out pt colon                                   Hi Dan  58 yr old with likely diverticular bleed (resolved) H/O diverticular bleed req IR embolization 2017 Neg colonoscopy 2014 except for diverticulosis.   Gabe and in my opinion will require rpt outpt colon.  Will make FU appt with you or in APP clinic.  Just to let you know  Merrie Roof

## 2019-09-18 NOTE — Telephone Encounter (Signed)
The pt has been scheduled for an appt with Dr [name] on 10/30/19 at 950 am.  She will be notified at discharge and letter mailed.

## 2019-09-18 NOTE — Progress Notes (Signed)
[facility]G 126 on 09/18/2019 @ 2122 Norlene Duel RN, BSN

## 2019-09-18 NOTE — Progress Notes (Addendum)
Patient ID: Carol Mueller, female   DOB: [birth year], 58 y.o.   MRN: [id number]    Progress Note   Subjective  Day #2 CC; GI bleed  HGb 11.7 down to 8.5 this  WBC 11.4 Temp 99 3  Up in chair, says she feels a bit weak but has not had any bleeding or stool since 2 PM yesterday. She has been having back pain and says she cannot tell currently whether her back is the origin of her pelvic discomfort or not.  CT abdomen pelvis on admit negative for any intra-abdominal pathology other than diverticulosis without diverticulitis  Stool studies ordered, pending as she has not had any further stool.    Objective   Vital signs in last 24 hours: Temp:  [99.3 F (37.4 C)-100 F (37.8 C)] 99.3 F (37.4 C) (11/02 0602) Pulse Rate:  [41-98] 88 (11/02 0602) Resp:  [17-21] 18 (11/02 0602) BP: (111-141)/(61-95) 117/73 (11/02 0602) SpO2:  [95 %-100 %] 98 % (11/02 0602) Weight:  [117 kg] 117 kg (11/02 0602) Last BM Date: 09/17/19 GEN;older AA female in NAD Heart:  Regular rate and rhythm; no murmurs Lungs: Respirations even and unlabored, lungs CTA bilaterally Abdomen:  Soft, mildly tender in the suprapubic area normal bowel sounds. Extremities:  Without edema. Neurologic:  Alert and oriented,  grossly normal neurologically. Psych:  Cooperative. Normal mood and affect.  Intake/Output from previous day: 11/01 0701 - 11/02 0700 In: 2342.2 [P.O.:120; I.V.:2222.2] Out: 600 [Urine:600] Intake/Output this shift: No intake/output data recorded.  Lab Results: Recent Labs    09/17/19 0753 09/17/19 1349 09/17/19 2130 09/18/19 0558  WBC 16.9*  --   --  11.4*  HGB 11.7* 10.6* 9.0* 8.5*  HCT 39.1 34.2* 29.9* 28.1*  PLT 368  --   --  264   BMET Recent Labs    09/17/19 0753 09/18/19 0558  NA 134* 135  137  K 4.5 3.8  3.8  CL 99 105  106  CO2 22 22  22   GLUCOSE 338* 143*  145*  BUN 26* 18  19  CREATININE 1.55* 0.88  0.91  CALCIUM 9.3 8.4*  8.6*   LFT Recent Labs   09/18/19 0558  PROT 6.4*  ALBUMIN 3.4*  3.5  AST 13*  ALT 17  ALKPHOS 37*  BILITOT 1.0   PT/INR No results for input(s): LABPROT, INR in the last 72 hours.  Studies/Results: Ct Abdomen Pelvis W Contrast  Result Date: 09/17/2019 CLINICAL DATA:  Lower pelvic pain and diarrhea since this morning. EXAM: CT ABDOMEN AND PELVIS WITH CONTRAST TECHNIQUE: Multidetector CT imaging of the abdomen and pelvis was performed using the standard protocol following bolus administration of intravenous contrast. CONTRAST:  71mL OMNIPAQUE IOHEXOL 300 MG/ML  SOLN COMPARISON:  06/02/2018 . FINDINGS: Lower chest: Unremarkable. Hepatobiliary: Diffuse low density of the liver relative to the spleen is again demonstrated. Cholecystectomy clips. Pancreas: Unremarkable. No pancreatic ductal dilatation or surrounding inflammatory changes. Spleen: Normal in size without focal abnormality. Adrenals/Urinary Tract: Surgically absent right adrenal gland with surgical clips. Normal appearing left adrenal gland. Bilateral simple appearing renal cysts. Unremarkable urinary bladder and ureters. Stomach/Bowel: Large number of colonic diverticula without evidence of diverticulitis. Normal appearing rectum. Normal appearing appendix, small bowel and stomach. Vascular/Lymphatic: No significant vascular findings are present. No enlarged abdominal or pelvic lymph nodes. Reproductive: Multiple calcified uterine fibroids are again noted. No adnexal mass. Other: Small right inguinal hernia containing fat. Small umbilical hernia containing fat. Musculoskeletal: Lumbar and lower thoracic spine  degenerative changes. IMPRESSION: 1. No acute abnormality. 2. Stable diffuse hepatic steatosis. 3. Colonic diverticulosis. 4. Calcified uterine fibroids. 5. Small right inguinal and umbilical hernias containing fat. Electronically Signed   By: Claudie Revering M.D.   On: 09/17/2019 09:16       Assessment / Plan:    #27 58 year old African-American female,  admitted yesterday with acute lower GI bleed.  She has prior history of diverticular bleeding, and has had diverticular bleed requiring IR embolization in the past. Patient had several bloody stools, no bowel movement or bleeding now since 2 PM yesterday.  Hemoglobin 11.7 on admit down to 8.5 early this a.m., no transfusion requirement.  Hopefully acute bleed has resolved.  #2 Cushing syndrome status post adrenalectomy #3 adult onset diabetes mellitus insulin-dependent #4 recent chronic diarrhea #5 acute kidney injury on chronic kidney disease 3 #6 obesity #7-leukocytosis-improved-suspect stress demargination rather than infection.  I do not see that UA was done, will order.  Plan; advance to full liquid diet Continue serial hemoglobins, transfuse for hemoglobin 7 or less. If patient has recurrent active bleed will need stat bleeding scan or go direct to IR for CT angio and possible embolization. Await stool studies though doubt infectious etiology at this time Dr. Rush Landmark had suggested IV Feraheme yesterday, will order x1 today     Active Problems:   Lower GI bleed     LOS: 0 days   Amy Esterwood PA-C 09/18/2019, 8:47 AM      Attending physician's note   I have taken an interval history, reviewed the chart and examined the patient. I agree with the Advanced Practitioner's note, impression and recommendations.   Likely diverticular bleed (resolved) Hb 11.7 to 8.5 (today). HD stable. No BMs since 2pm yesterday. H/O diverticular bleed req IR embolization 2017 Neg colonoscopy 2014 except for diverticulosis.  Plan: -Advance diet. -Trend CBC.  Transfuse if Hb < 7 -IV Feraheme today. -FU with GI (Dr [name]) as outpatient in 2-3 weeks. May require out-pt colonoscopy. -Avoid nonsteroidals. -Will sign off for now.  Carmell Austria, MD [facility] [phone].

## 2019-09-19 DIAGNOSIS — E119 Type 2 diabetes mellitus without complications: Secondary | ICD-10-CM

## 2019-09-19 DIAGNOSIS — Z794 Long term (current) use of insulin: Secondary | ICD-10-CM

## 2019-09-19 LAB — CBC WITH DIFFERENTIAL/PLATELET
Abs Immature Granulocytes: 0.12 10*3/uL — ABNORMAL HIGH (ref 0.00–0.07)
Basophils Absolute: 0.1 10*3/uL (ref 0.0–0.1)
Basophils Relative: 1 %
Eosinophils Absolute: 0.7 10*3/uL — ABNORMAL HIGH (ref 0.0–0.5)
Eosinophils Relative: 7 %
HCT: 25.8 % — ABNORMAL LOW (ref 36.0–46.0)
Hemoglobin: 7.8 g/dL — ABNORMAL LOW (ref 12.0–15.0)
Immature Granulocytes: 1 %
Lymphocytes Relative: 29 %
Lymphs Abs: 3.2 10*3/uL (ref 0.7–4.0)
MCH: 22.1 pg — ABNORMAL LOW (ref 26.0–34.0)
MCHC: 30.2 g/dL (ref 30.0–36.0)
MCV: 73.1 fL — ABNORMAL LOW (ref 80.0–100.0)
Monocytes Absolute: 0.9 10*3/uL (ref 0.1–1.0)
Monocytes Relative: 8 %
Neutro Abs: 5.9 10*3/uL (ref 1.7–7.7)
Neutrophils Relative %: 54 %
Platelets: 257 10*3/uL (ref 150–400)
RBC: 3.53 MIL/uL — ABNORMAL LOW (ref 3.87–5.11)
RDW: 17.1 % — ABNORMAL HIGH (ref 11.5–15.5)
WBC: 10.8 10*3/uL — ABNORMAL HIGH (ref 4.0–10.5)
nRBC: 0 % (ref 0.0–0.2)

## 2019-09-19 LAB — GLUCOSE, CAPILLARY
Glucose-Capillary: 126 mg/dL — ABNORMAL HIGH (ref 70–99)
Glucose-Capillary: 136 mg/dL — ABNORMAL HIGH (ref 70–99)
Glucose-Capillary: 91 mg/dL (ref 70–99)

## 2019-09-19 LAB — HEMATOCRIT: HCT: 27.5 % — ABNORMAL LOW (ref 36.0–46.0)

## 2019-09-19 LAB — HEMOGLOBIN: Hemoglobin: 8.4 g/dL — ABNORMAL LOW (ref 12.0–15.0)

## 2019-09-19 MED ORDER — FERROUS SULFATE 325 (65 FE) MG PO TABS: 325.0000 mg | ORAL_TABLET | Freq: Every day | ORAL | Status: DC

## 2019-09-19 MED ORDER — INSULIN GLARGINE 100 UNIT/ML ~~LOC~~ SOLN: 15.0000 [IU] | Freq: Every day | SUBCUTANEOUS | 11 refills | Status: DC

## 2019-09-19 MED ORDER — OLMESARTAN MEDOXOMIL 5 MG PO TABS: 5.0000 mg | ORAL_TABLET | Freq: Every day | ORAL | 0 refills | Status: DC

## 2019-09-19 MED ORDER — ACETAMINOPHEN 325 MG PO TABS: 650.0000 mg | ORAL_TABLET | Freq: Four times a day (QID) | ORAL | 0 refills | Status: DC | PRN

## 2019-09-19 NOTE — Progress Notes (Signed)
Patient ID: Carol Mueller, female   DOB: 18-Feb-1961, 58 y.o.   MRN: [id number]   GI Brief note  Pt has been scheduled for Follow up with Dr [name] on 12/14  At 9:50 pm

## 2019-09-19 NOTE — Plan of Care (Signed)
Pt was discharged home today. Instructions were reviewed with patient, and questions were answered. Pt was taken to main entrance via wheelchair by NT.  

## 2019-09-19 NOTE — Discharge Summary (Signed)
Physician Discharge Summary  Patient ID: Carol Mueller MRN: [id number] DOB/AGE: 59-Aug-1962 59 y.o.  Admit date: 09/17/2019 Discharge date: 09/19/2019  Admission Diagnoses:  Discharge Diagnoses:  Active Problems:   Lower GI bleed   Rectal bleed   Discharged Condition: stable  Hospital Course:  Patient is a 58 year old African-American female with past medical history significant for diverticulosis, status post unilateral adrenalectomy for Cushing's syndrome, type 2 diabetes mellitus, hypertension, hyperlipidemia and chronic diarrhea.  Patient was admitted with bright red blood per rectum resulting in severe symptomatic anemia.  Patient was admitted and managed supportively.  Gastrointestinal team assisted in directing patient's care.  Colonoscopy was brought with the patient but patient preferred to defer colonoscopy for now.  Bleeding has resolved.  Patient will follow with the GI team on discharge.  Most likely, repeat colonoscopy will be done on outpatient basis as the last colonoscopy was about 6 years ago.  Patient will be discharged back on to the care of the primary care provider and the GI team.  Rectal bleed: -Suspect diverticular bleed. -H&H was monitored, and the patient was managed supportively. -Likely follow-up with the GI team on discharge for outpatient colonoscopy. -GI team is cleared patient for discharge.  Acute kidney injury:   -Likely prerenal.   -Acute kidney injury resolved with volume resuscitation.  -Continue to monitor renal function closely on discharge.    Leukocytosis -Likely reactive.   -Continue to monitor.    Morbid Obesity: -Further management on outpatient basis.  Hepatic steatosis -Likely related to body habitus. -BMI 46.08 -Follow-up with GI on discharge.  Weight loss and statins.  T2DM/IDDM: - last A1c was 9.1 (05/25/2019) -Patient was managed with subcutaneous Lantus and sliding scale insulin coverage.    Hypertension:  -  continued carvedilol -Held olmesartan and HCTZ -Stable. -PCP to follow-up with patient, continue to monitor and optimize blood pressure control.  Hyperlipidemia:  -Continued atorvastatin  Consults: GI  Significant Diagnostic Studies:   Discharge Exam: Blood pressure 118/68, pulse 91, temperature 98.6 F (37 C), temperature source Oral, resp. rate (!) 22, weight 108 kg, SpO2 97 %.  Disposition: Discharge disposition: 01-Home or Self Care   Discharge Instructions    Diet - low sodium heart healthy   Complete by: As directed    Diet Carb Modified   Complete by: As directed    Increase activity slowly   Complete by: As directed      Allergies as of 09/19/2019      Reactions   Norvasc [amlodipine Besylate] Swelling   ANGIOEDEMA   Nsaids Other (See Comments)   GI upset/ GI bleed      Medication List    STOP taking these medications   Basaglar KwikPen 100 UNIT/ML Sopn Replaced by: insulin glargine 100 UNIT/ML injection   hydrochlorothiazide 25 MG tablet Commonly known as: HYDRODIURIL   Jardiance 10 MG Tabs tablet Generic drug: empagliflozin   Lutein-Zeaxanthin 20-1 MG Caps   metFORMIN 1000 MG tablet Commonly known as: GLUCOPHAGE   Omega 3 1000 MG Caps   SLOW MAGNESIUM/CALCIUM PO     TAKE these medications   acetaminophen 325 MG tablet Commonly known as: TYLENOL Take 2 tablets (650 mg total) by mouth every 6 (six) hours as needed for mild pain (or Fever >/= 101). What changed:   medication strength  how much to take  reasons to take this   atorvastatin 10 MG tablet Commonly known as: LIPITOR Take 10 mg by mouth daily.   carvedilol 3.125 MG tablet  Commonly known as: COREG Take 3.125 mg by mouth 2 (two) times daily with a meal.   cyclobenzaprine 10 MG tablet Commonly known as: FLEXERIL Take 10 mg by mouth 3 (three) times daily as needed for muscle spasms.   dicyclomine 20 MG tablet Commonly known as: BENTYL TAKE 1 TABLET BY MOUTH TWICE A  DAY What changed:   when to take this  reasons to take this   insulin glargine 100 UNIT/ML injection Commonly known as: LANTUS Inject 0.15 mLs (15 Units total) into the skin daily at 10 pm. Replaces: Basaglar KwikPen 100 UNIT/ML Sopn   multivitamin with minerals tablet Take 1 tablet by mouth daily.   olmesartan 5 MG tablet Commonly known as: BENICAR Take 1 tablet (5 mg total) by mouth daily. What changed:   medication strength  how much to take        Signed: Bonnell Public 09/19/2019, 3:05 PM

## 2019-09-19 NOTE — Discharge Instructions (Signed)
You have a follow up appt with  GI - Dr [name] on 10/30/19 at 9:50 am - [place]

## 2019-10-02 ENCOUNTER — Inpatient Hospital Stay (HOSPITAL_COMMUNITY)
Admission: EM | Admit: 2019-10-02 | Discharge: 2019-10-04 | DRG: 378 | Disposition: A | Discharge status: Home / Self Care | Payer: No Typology Code available for payment source | Attending: Internal Medicine | Admitting: Internal Medicine

## 2019-10-02 ENCOUNTER — Encounter (HOSPITAL_COMMUNITY): Payer: Self-pay

## 2019-10-02 ENCOUNTER — Other Ambulatory Visit: Payer: Self-pay

## 2019-10-02 DIAGNOSIS — D62 Acute posthemorrhagic anemia: Secondary | ICD-10-CM | POA: Diagnosis not present

## 2019-10-02 DIAGNOSIS — D124 Benign neoplasm of descending colon: Secondary | ICD-10-CM | POA: Diagnosis present

## 2019-10-02 DIAGNOSIS — N858 Other specified noninflammatory disorders of uterus: Secondary | ICD-10-CM | POA: Diagnosis present

## 2019-10-02 DIAGNOSIS — Z833 Family history of diabetes mellitus: Secondary | ICD-10-CM

## 2019-10-02 DIAGNOSIS — K640 First degree hemorrhoids: Secondary | ICD-10-CM | POA: Diagnosis present

## 2019-10-02 DIAGNOSIS — E119 Type 2 diabetes mellitus without complications: Secondary | ICD-10-CM | POA: Diagnosis present

## 2019-10-02 DIAGNOSIS — I10 Essential (primary) hypertension: Secondary | ICD-10-CM | POA: Diagnosis present

## 2019-10-02 DIAGNOSIS — Z20828 Contact with and (suspected) exposure to other viral communicable diseases: Secondary | ICD-10-CM | POA: Diagnosis present

## 2019-10-02 DIAGNOSIS — K760 Fatty (change of) liver, not elsewhere classified: Secondary | ICD-10-CM | POA: Diagnosis present

## 2019-10-02 DIAGNOSIS — Z8249 Family history of ischemic heart disease and other diseases of the circulatory system: Secondary | ICD-10-CM

## 2019-10-02 DIAGNOSIS — Z9049 Acquired absence of other specified parts of digestive tract: Secondary | ICD-10-CM

## 2019-10-02 DIAGNOSIS — Z803 Family history of malignant neoplasm of breast: Secondary | ICD-10-CM

## 2019-10-02 DIAGNOSIS — Z79899 Other long term (current) drug therapy: Secondary | ICD-10-CM

## 2019-10-02 DIAGNOSIS — K922 Gastrointestinal hemorrhage, unspecified: Secondary | ICD-10-CM

## 2019-10-02 DIAGNOSIS — K921 Melena: Secondary | ICD-10-CM | POA: Diagnosis not present

## 2019-10-02 DIAGNOSIS — Z794 Long term (current) use of insulin: Secondary | ICD-10-CM

## 2019-10-02 DIAGNOSIS — E876 Hypokalemia: Secondary | ICD-10-CM | POA: Diagnosis present

## 2019-10-02 DIAGNOSIS — K5731 Diverticulosis of large intestine without perforation or abscess with bleeding: Principal | ICD-10-CM

## 2019-10-02 DIAGNOSIS — R109 Unspecified abdominal pain: Secondary | ICD-10-CM

## 2019-10-02 DIAGNOSIS — D123 Benign neoplasm of transverse colon: Secondary | ICD-10-CM | POA: Diagnosis present

## 2019-10-02 DIAGNOSIS — Z6841 Body Mass Index (BMI) 40.0 and over, adult: Secondary | ICD-10-CM

## 2019-10-02 DIAGNOSIS — M62838 Other muscle spasm: Secondary | ICD-10-CM | POA: Diagnosis present

## 2019-10-02 DIAGNOSIS — E249 Cushing's syndrome, unspecified: Secondary | ICD-10-CM | POA: Diagnosis present

## 2019-10-02 DIAGNOSIS — R197 Diarrhea, unspecified: Secondary | ICD-10-CM | POA: Diagnosis present

## 2019-10-02 DIAGNOSIS — Z888 Allergy status to other drugs, medicaments and biological substances status: Secondary | ICD-10-CM

## 2019-10-02 DIAGNOSIS — E785 Hyperlipidemia, unspecified: Secondary | ICD-10-CM | POA: Diagnosis present

## 2019-10-02 LAB — COMPREHENSIVE METABOLIC PANEL
ALT: 20 U/L (ref 0–44)
AST: 22 U/L (ref 15–41)
Albumin: 3.6 g/dL (ref 3.5–5.0)
Alkaline Phosphatase: 43 U/L (ref 38–126)
Anion gap: 10 (ref 5–15)
BUN: 15 mg/dL (ref 6–20)
CO2: 21 mmol/L — ABNORMAL LOW (ref 22–32)
Calcium: 8.7 mg/dL — ABNORMAL LOW (ref 8.9–10.3)
Chloride: 110 mmol/L (ref 98–111)
Creatinine, Ser: 0.91 mg/dL (ref 0.44–1.00)
GFR calc Af Amer: >60 mL/min (ref >60)
GFR calc non Af Amer: >60 mL/min (ref >60)
Glucose, Bld: 160 mg/dL — ABNORMAL HIGH (ref 70–99)
Potassium: 3.2 mmol/L — ABNORMAL LOW (ref 3.5–5.1)
Sodium: 141 mmol/L (ref 135–145)
Total Bilirubin: 0.7 mg/dL (ref 0.3–1.2)
Total Protein: 6.8 g/dL (ref 6.5–8.1)

## 2019-10-02 LAB — HEMOGLOBIN AND HEMATOCRIT, BLOOD
HCT: 27.8 % — ABNORMAL LOW (ref 36.0–46.0)
HCT: 26.3 % — ABNORMAL LOW (ref 36.0–46.0)
HCT: 26.1 % — ABNORMAL LOW (ref 36.0–46.0)
Hemoglobin: 8.1 g/dL — ABNORMAL LOW (ref 12.0–15.0)
Hemoglobin: 7.7 g/dL — ABNORMAL LOW (ref 12.0–15.0)
Hemoglobin: 7.5 g/dL — ABNORMAL LOW (ref 12.0–15.0)

## 2019-10-02 LAB — CBC
HCT: 31.8 % — ABNORMAL LOW (ref 36.0–46.0)
Hemoglobin: 9.2 g/dL — ABNORMAL LOW (ref 12.0–15.0)
MCH: 22.7 pg — ABNORMAL LOW (ref 26.0–34.0)
MCHC: 28.9 g/dL — ABNORMAL LOW (ref 30.0–36.0)
MCV: 78.5 fL — ABNORMAL LOW (ref 80.0–100.0)
Platelets: 429 10*3/uL — ABNORMAL HIGH (ref 150–400)
RBC: 4.05 MIL/uL (ref 3.87–5.11)
RDW: 22.5 % — ABNORMAL HIGH (ref 11.5–15.5)
WBC: 13.3 10*3/uL — ABNORMAL HIGH (ref 4.0–10.5)
nRBC: 0.2 % (ref 0.0–0.2)

## 2019-10-02 LAB — GLUCOSE, CAPILLARY
Glucose-Capillary: 78 mg/dL (ref 70–99)
Glucose-Capillary: 86 mg/dL (ref 70–99)
Glucose-Capillary: 99 mg/dL (ref 70–99)

## 2019-10-02 LAB — IRON AND TIBC
Iron: 47 ug/dL (ref 28–170)
Saturation Ratios: 17 % (ref 10.4–31.8)
TIBC: 272 ug/dL (ref 250–450)
UIBC: 225 ug/dL

## 2019-10-02 LAB — HCG, QUANTITATIVE, PREGNANCY: hCG, Beta Chain, Quant, S: 9 m[IU]/mL — ABNORMAL HIGH (ref <5)

## 2019-10-02 LAB — SARS CORONAVIRUS 2 (TAT 6-24 HRS): SARS Coronavirus 2: NEGATIVE

## 2019-10-02 MED ORDER — CYCLOBENZAPRINE HCL 10 MG PO TABS: 10.0000 mg | ORAL_TABLET | Freq: Three times a day (TID) | ORAL | Status: DC | PRN

## 2019-10-02 MED ORDER — DICYCLOMINE HCL 20 MG PO TABS
20.0000 mg | ORAL_TABLET | ORAL | Status: DC | PRN
  Administered 2019-10-03: 13:00:00 20 mg via ORAL
  Filled 2019-10-02: qty 1

## 2019-10-02 MED ORDER — INSULIN GLARGINE 100 UNIT/ML ~~LOC~~ SOLN
15.0000 [IU] | Freq: Every day | SUBCUTANEOUS | Status: DC
  Administered 2019-10-02 – 2019-10-03 (×2): 15 [IU] via SUBCUTANEOUS
  Filled 2019-10-02 (×3): qty 0.15

## 2019-10-02 MED ORDER — INSULIN ASPART 100 UNIT/ML ~~LOC~~ SOLN
0.0000 [IU] | SUBCUTANEOUS | Status: DC
  Administered 2019-10-03 – 2019-10-04 (×3): 2 [IU] via SUBCUTANEOUS
  Administered 2019-10-04: 09:00:00 1 [IU] via SUBCUTANEOUS

## 2019-10-02 MED ORDER — PEG-KCL-NACL-NASULF-NA ASC-C 100 G PO SOLR
0.5000 | Freq: Once | ORAL | Status: AC
  Administered 2019-10-02: 18:00:00 100 g via ORAL
  Filled 2019-10-02: qty 1

## 2019-10-02 MED ORDER — CANAGLIFLOZIN 100 MG PO TABS
100.0000 mg | ORAL_TABLET | Freq: Every day | ORAL | Status: DC
  Administered 2019-10-04: 09:00:00 100 mg via ORAL
  Filled 2019-10-02 (×2): qty 1

## 2019-10-02 MED ORDER — ATORVASTATIN CALCIUM 10 MG PO TABS
10.0000 mg | ORAL_TABLET | Freq: Every day | ORAL | Status: DC
  Administered 2019-10-04: 10:00:00 10 mg via ORAL
  Filled 2019-10-02: qty 1

## 2019-10-02 MED ORDER — ACETAMINOPHEN 325 MG PO TABS
650.0000 mg | ORAL_TABLET | Freq: Four times a day (QID) | ORAL | Status: DC | PRN
  Administered 2019-10-02 – 2019-10-03 (×2): 650 mg via ORAL
  Filled 2019-10-02 (×2): qty 2

## 2019-10-02 MED ORDER — MULTI-VITAMIN/MINERALS PO TABS: 1.0000 | ORAL_TABLET | Freq: Every day | ORAL | Status: DC

## 2019-10-02 MED ORDER — PEG-KCL-NACL-NASULF-NA ASC-C 100 G PO SOLR: 1.0000 | Freq: Once | ORAL | Status: DC

## 2019-10-02 MED ORDER — ACETAMINOPHEN 650 MG RE SUPP: 650.0000 mg | Freq: Four times a day (QID) | RECTAL | Status: DC | PRN

## 2019-10-02 MED ORDER — FERROUS SULFATE 325 (65 FE) MG PO TABS: 325.0000 mg | ORAL_TABLET | Freq: Every day | ORAL | Status: DC

## 2019-10-02 MED ORDER — ACETAMINOPHEN 325 MG PO TABS: 650.0000 mg | ORAL_TABLET | Freq: Four times a day (QID) | ORAL | Status: DC | PRN

## 2019-10-02 MED ORDER — SODIUM CHLORIDE 0.9 % IV SOLN
INTRAVENOUS | Status: DC
  Administered 2019-10-02 – 2019-10-03 (×3): via INTRAVENOUS

## 2019-10-02 MED ORDER — ADULT MULTIVITAMIN W/MINERALS CH
1.0000 | ORAL_TABLET | Freq: Every day | ORAL | Status: DC
  Administered 2019-10-04: 1 via ORAL
  Filled 2019-10-02: qty 1

## 2019-10-02 MED ORDER — ONDANSETRON HCL 4 MG/2ML IJ SOLN: 4.0000 mg | Freq: Four times a day (QID) | INTRAMUSCULAR | Status: DC | PRN

## 2019-10-02 MED ORDER — PEG-KCL-NACL-NASULF-NA ASC-C 100 G PO SOLR
0.5000 | Freq: Once | ORAL | Status: AC
  Administered 2019-10-03: 04:00:00 100 g via ORAL

## 2019-10-02 MED ORDER — ONDANSETRON HCL 4 MG PO TABS: 4.0000 mg | ORAL_TABLET | Freq: Four times a day (QID) | ORAL | Status: DC | PRN

## 2019-10-02 NOTE — Plan of Care (Signed)
58 yo female with hx of diverticulosis c/o brbpr Prior admission for rectal bleeding on 09/17/2019 GI evaluation at that time thought secondary to diverticulosis  Wbc 13.3, hgb 9.2, Plt 429  ED requesting admission for GI bleeding.

## 2019-10-02 NOTE — H&P (View-Only) (Signed)
Consultation  Referring Provider: [facility] hospitalist/Chandra MD  primary Care Physician:  [name], DO Primary Gastroenterologist:  [name]  Reason for Consultation: GI bleed  HPI: Carol Mueller is a 58 y.o. female, known to Dr. [name] with history of diverticulosis and previous diverticular bleeding.  Patient also with history of Cushing's status post adrenalectomy, insulin-dependent diabetes mellitus, hypertension and is status post cholecystectomy. She is admitted through the emergency room early this morning after acute onset of large-volume hematochezia at home x2.  She says she was awakened from sleep with the urge for bowel movement and passed a very large amount of dark red blood.  She did not have any associated abdominal pain nausea or vomiting.  After arrival in the emergency room she had 3 more episodes of bleeding and has not had another bowel movement now since about 6 AM. Patient was very recently admitted 11/1 through 09/19/2019 with what was felt to be a diverticular bleed.  Hemoglobin was 11.7 at admission with drop to 7.8 on discharge, she did not require transfusion.  She was scheduled to see Dr. [name] in follow-up in the office to discuss colonoscopy. She has not been on any aspirin or NSAIDs.  She says she had felt fine after discharge from the hospital after her last admission and had been having normal bowel movements.  On admission today hemoglobin 9.2 hematocrit of 31.8 MCV of 78.5, potassium 3.2, BUN 15, creatinine 0.91 Repeat hemoglobin at 10:30 AM today-8.1.  EGD was done in 2016 and normal.  Last colonoscopy February 2014 with moderate diverticulosis in the left and sigmoid colon, no polyps.   Past Medical History:  Diagnosis Date  . Anemia   . Cushing's syndrome ([place])    due to adrenal tumor  . Diabetes mellitus without complication (HCC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  . Diverticulitis   . Diverticulosis   . GI bleed   . Hyperlipidemia   .  Hypertension   . Polycystic ovary disease   . Sepsis ([place]) 05/2019    Past Surgical History:  Procedure Laterality Date  . ADRENALECTOMY  1996  . CHOLECYSTECTOMY    . ESOPHAGOGASTRODUODENOSCOPY (EGD) WITH PROPOFOL N/A 07/16/2015   Procedure: ESOPHAGOGASTRODUODENOSCOPY (EGD) WITH PROPOFOL;  Surgeon: [name], MD;  Location: [facility];  Service: Endoscopy;  Laterality: N/A;    Prior to Admission medications   Medication Sig Start Date End Date Taking? Authorizing Provider  acetaminophen (TYLENOL) 325 MG tablet Take 2 tablets (650 mg total) by mouth every 6 (six) hours as needed for mild pain (or Fever >/= 101). 09/19/19  Yes [name] I, MD  atorvastatin (LIPITOR) 10 MG tablet Take 10 mg by mouth daily. 03/06/19  Yes [provider]  carvedilol (COREG) 3.125 MG tablet Take 3.125 mg by mouth 2 (two) times daily with a meal.   Yes [provider]  cyclobenzaprine (FLEXERIL) 10 MG tablet Take 10 mg by mouth 3 (three) times daily as needed for muscle spasms. 09/14/19  Yes [provider]  dicyclomine (BENTYL) 20 MG tablet TAKE 1 TABLET BY MOUTH TWICE A DAY Patient taking differently: Take 20 mg by mouth as needed (Stomach pain).  06/02/17  Yes [name], MD  empagliflozin (JARDIANCE) 10 MG TABS tablet Take 10 mg by mouth daily.   Yes [provider]  insulin glargine (LANTUS) 100 UNIT/ML injection Inject 0.15 mLs (15 Units total) into the skin daily at 10 pm. Patient taking differently: Inject 30 Units into the skin  daily at 10 pm.  09/19/19  Yes Bonnell Public, MD  metFORMIN (GLUCOPHAGE) 500 MG tablet Take 500 mg by mouth 2 (two) times daily with a meal.   Yes [provider]  Multiple Vitamins-Minerals (MULTIVITAMIN WITH MINERALS) tablet Take 1 tablet by mouth daily.   Yes [provider]  olmesartan (BENICAR) 5 MG tablet Take 1 tablet (5 mg total) by mouth daily. 09/19/19 10/19/19 Yes Bonnell Public, MD     Current Facility-Administered Medications  Medication Dose Route Frequency Provider Last Rate Last Dose  . 0.9 %  sodium chloride infusion   Intravenous Continuous Truddie Hidden, MD 100 mL/hr at 10/02/19 0939    . acetaminophen (TYLENOL) tablet 650 mg  650 mg Oral Q6H PRN Truddie Hidden, MD       Or  . acetaminophen (TYLENOL) suppository 650 mg  650 mg Rectal Q6H PRN Truddie Hidden, MD      . acetaminophen (TYLENOL) tablet 650 mg  650 mg Oral Q6H PRN Truddie Hidden, MD      . atorvastatin (LIPITOR) tablet 10 mg  10 mg Oral Daily Truddie Hidden, MD      . Derrill Memo ON 10/03/2019] canagliflozin [facility]) tablet 100 mg  100 mg Oral QAC breakfast Truddie Hidden, MD      . cyclobenzaprine (FLEXERIL) tablet 10 mg  10 mg Oral TID PRN Truddie Hidden, MD      . dicyclomine (BENTYL) tablet 20 mg  20 mg Oral PRN Truddie Hidden, MD      . Derrill Memo ON 10/03/2019] ferrous sulfate tablet 325 mg  325 mg Oral Q breakfast Truddie Hidden, MD      . insulin aspart (novoLOG) injection 0-9 Units  0-9 Units Subcutaneous Q4H Truddie Hidden, MD      . insulin glargine (LANTUS) injection 15 Units  15 Units Subcutaneous Q2200 Truddie Hidden, MD      . multivitamin with minerals tablet 1 tablet  1 tablet Oral Daily Truddie Hidden, MD      . ondansetron [facility]) tablet 4 mg  4 mg Oral Q6H PRN Truddie Hidden, MD       Or  . ondansetron [facility]) injection 4 mg  4 mg Intravenous Q6H PRN Truddie Hidden, MD        Allergies as of 10/02/2019 - Review Complete 10/02/2019  Allergen Reaction Noted  . Norvasc [amlodipine besylate] Swelling 02/22/2012  . Nsaids Other (See Comments) 08/07/2015    Family History  Problem Relation Age of Onset  . Hypertension Mother   . Cancer Mother        ?uterine or ovarian cancer  . Hypertension Father   . Diabetes Father   . Hypertension Sister   . Diabetes Sister   . Hypertension Brother   . Diabetes Brother   . Hypertension Maternal Grandfather   .  Diabetes Paternal Grandmother   . Breast cancer Maternal Grandmother   . Colon cancer Neg Hx     Social History   Socioeconomic History  . Marital status: Single    Spouse name: N/A  . Number of children: 0  . Years of education: Not on file  . Highest education level: Not on file  Occupational History  . Occupation: NURSE    Comment: [employer]  Social Needs  . Financial resource strain: Not on file  . Food insecurity    Worry: Not on file    Inability: Not on file  . Transportation needs    Medical: Not on file  Non-medical: Not on file  Tobacco Use  . Smoking status: Never Smoker  . Smokeless tobacco: Never Used  Substance and Sexual Activity  . Alcohol use: No  . Drug use: No  . Sexual activity: Not Currently    Birth control/protection: Post-menopausal  Lifestyle  . Physical activity    Days per week: Not on file    Minutes per session: Not on file  . Stress: Not on file  Relationships  . Social Herbalist on phone: Not on file    Gets together: Not on file    Attends religious service: Not on file    Active member of club or organization: Not on file    Attends meetings of clubs or organizations: Not on file    Relationship status: Not on file  . Intimate partner violence    Fear of current or ex partner: Not on file    Emotionally abused: Not on file    Physically abused: Not on file    Forced sexual activity: Not on file  Other Topics Concern  . Not on file  Social History Narrative   Lives alone. Parents live in [place].    Review of Systems: Pertinent positive and negative review of systems were noted in the above HPI section.  All other review of systems was otherwise negative.  Physical Exam: Vital signs in last 24 hours: Temp:  [98.6 F (37 C)-98.8 F (37.1 C)] 98.8 F (37.1 C) (11/16 0943) Pulse Rate:  [84-99] 84 (11/16 0943) Resp:  [17-24] 18 (11/16 0943) BP: (131-146)/(72-85) 131/85 (11/16 0943) SpO2:  [95 %-100 %] 98 %  (11/16 0943) Weight:  [id number] kg] 108 kg (11/16 0336)   General:   Alert,  Well-developed, well-nourished, older African-American female pleasant and cooperative in NAD Head:  Normocephalic and atraumatic. Eyes:  Sclera clear, no icterus.   Conjunctiva pink. Ears:  Normal auditory acuity. Nose:  No deformity, discharge,  or lesions. Mouth:  No deformity or lesions.   Neck:  Supple; no masses or thyromegaly. Lungs:  Clear throughout to auscultation.   No wheezes, crackles, or rhonchi. Heart:  Regular rate and rhythm; no murmurs, clicks, rubs,  or gallops. Abdomen:  Soft,nontender, BS active,nonpalp mass or hsm.   Rectal:  Deferred  Msk:  Symmetrical without gross deformities. . Pulses:  Normal pulses noted. Extremities:  Without clubbing or edema. Neurologic:  Alert and  oriented x4;  grossly normal neurologically. Skin:  Intact without significant lesions or rashes.. Psych:  Alert and cooperative. Normal mood and affect.  Intake/Output from previous day: No intake/output data recorded. Intake/Output this shift: No intake/output data recorded.  Lab Results: Recent Labs    10/02/19 0409 10/02/19 1047  WBC 13.3*  --   HGB 9.2* 8.1*  HCT 31.8* 27.8*  PLT 429*  --    BMET Recent Labs    10/02/19 0409  NA 141  K 3.2*  CL 110  CO2 21*  GLUCOSE 160*  BUN 15  CREATININE 0.91  CALCIUM 8.7*   LFT Recent Labs    10/02/19 0409  PROT 6.8  ALBUMIN 3.6  AST 22  ALT 20  ALKPHOS 43  BILITOT 0.7   PT/INR No results for input(s): LABPROT, INR in the last 72 hours. Hepatitis Panel No results for input(s): HEPBSAG, HCVAB, HEPAIGM, HEPBIGM in the last 72 hours.   IMPRESSION:  #47 58 year old African-American female with history of previous diverticular bleeding and recent admission 2 weeks ago with what was  felt to be a diverticular hemorrhage who was readmitted this morning after acute onset of large-volume hematochezia.  She has been hemodynamically stable.  Hemoglobin was  7.8 on discharge 2 weeks ago and was 9.2 on admission today, down to 8.1 with second check  No bowel movements over the past 6 hours-encouraging, hopefully acute hemorrhage has stopped  #2 anemia-secondary to acute and subacute GI blood loss #3 microcytosis rule out iron deficiency #4 Cushing's status post adrenalectomy #5 adult onset diabetes mellitus-insulin-dependent #6 hypertension #7 status post cholecystectomy  Plan; clear liquid diet Bedrest with bedside commode today Hemoglobin every 6 hours and transfuse for hemoglobin 7 or less If patient has further active bleeding this afternoon will need CT angio and possible embolization. May be prudent to proceed with colonoscopy this admission, will discuss timing with Dr. Loletha Carrow.  Patient remains stable and does not require angiography, consider prep this evening with colonoscopy tomorrow. Thank you, we will follow with you     Amy Esterwood PA-C 10/02/2019, 11:15 AM  I have reviewed the entire case in detail with the above APP and discussed the plan in detail.  Therefore, I agree with the diagnoses recorded above. In addition,  I have personally interviewed and examined the patient and have personally reviewed any abdominal/pelvic CT scan images.  My additional thoughts are as follows:  Recurrent diverticular bleeding, several episodes in last few years, including most recent visit within the last 2 weeks.  I feel we need to exclude other colonic sources such as AVM.  Thus far, we have not been able to catch this on CTA to then allow angiographic intervention.  If other sources excluded on colonoscopy tomorrow, and if she rebleeds, needs repeat CT angiogram and surgical consult.  Office procedure will be done either by me or my partner Dr. Fuller Plan, depending on availability.  She is in the midst of bowel prep right now, tolerating it reasonably well, and we will check her hemoglobin in the morning. Last hemoglobin was 7.7 this afternoon  down from 8.1 earlier this morning but without overt bleeding.  Nelida Meuse III Office:[phone]

## 2019-10-02 NOTE — ED Notes (Signed)
Third time to the bathroom with bright red blood noted in toilet. Patient ambulatory.

## 2019-10-02 NOTE — H&P (Addendum)
History and Physical    Carol Mueller [id number] DOB: [birth year] DOA: 10/02/2019  PCP: [name], DO   Patient coming from: Home  I have personally briefly reviewed patient's old medical records in [place]  Chief Complaint: Rectal bleeding  HPI: Carol Mueller is a 58 y.o. female with medical history significant of hx of unilateral adrenalectomy for Cushing's syndrome, T2DM/IDDM, HTN, HLD and chronic diarrhea who presents with abdominal discomfort and recurrence of bloody bowel movements.  Patient reports she was in her usual state of health through last night and had onset of BRBPR since the middle of this morning.  She states it was very bloody filled the entire toilet bowl.  She has since had 4 more bloody bowel movements, with the most recent one being around 6 AM.  Patient states she also has left-sided abdominal discomfort.  She denies any fevers, chills, cough, shortness of breath, chest pain, palpitations.  She does report a little bit of lightheadedness but not to the point that she feels she will pass out.  She was recently here 2 weeks prior with similar complaints.  She reports that decided that colonoscopy could be done as an outpatient and she was scheduled to follow-up on December 14.  Patient has not eaten anything since 4:30 PM yesterday.  She has continued to avoid aspirin, NSAIDs, other medications that may contribute.  She states she stopped taking fish oil.    Review of Systems: As per HPI otherwise 10 point review of systems negative.    Past Medical History:  Diagnosis Date  . Anemia   . Cushing's syndrome ([place])    due to adrenal tumor  . Diabetes mellitus without complication (HCC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  . Diverticulitis   . Diverticulosis   . GI bleed   . Hyperlipidemia   . Hypertension   . Polycystic ovary disease   . Sepsis ([place]) 05/2019    Past Surgical History:  Procedure Laterality Date  . ADRENALECTOMY  1996  .  CHOLECYSTECTOMY    . ESOPHAGOGASTRODUODENOSCOPY (EGD) WITH PROPOFOL N/A 07/16/2015   Procedure: ESOPHAGOGASTRODUODENOSCOPY (EGD) WITH PROPOFOL;  Surgeon: [name], MD;  Location: [facility];  Service: Endoscopy;  Laterality: N/A;     reports that she has never smoked. She has never used smokeless tobacco. She reports that she does not drink alcohol or use drugs.  Allergies  Allergen Reactions  . Norvasc [Amlodipine Besylate] Swelling    ANGIOEDEMA  . Nsaids Other (See Comments)    GI upset/ GI bleed    Family History  Problem Relation Age of Onset  . Hypertension Mother   . Cancer Mother        ?uterine or ovarian cancer  . Hypertension Father   . Diabetes Father   . Hypertension Sister   . Diabetes Sister   . Hypertension Brother   . Diabetes Brother   . Hypertension Maternal Grandfather   . Diabetes Paternal Grandmother   . Breast cancer Maternal Grandmother   . Colon cancer Neg Hx      Prior to Admission medications   Medication Sig Start Date End Date Taking? Authorizing Provider  acetaminophen (TYLENOL) 325 MG tablet Take 2 tablets (650 mg total) by mouth every 6 (six) hours as needed for mild pain (or Fever >/= 101). 09/19/19  Yes [name] I, MD  atorvastatin (LIPITOR) 10 MG tablet Take 10 mg by mouth daily. 03/06/19  Yes [provider]  carvedilol (COREG) 3.125 MG  tablet Take 3.125 mg by mouth 2 (two) times daily with a meal.   Yes [provider]  cyclobenzaprine (FLEXERIL) 10 MG tablet Take 10 mg by mouth 3 (three) times daily as needed for muscle spasms. 09/14/19  Yes [provider]  dicyclomine (BENTYL) 20 MG tablet TAKE 1 TABLET BY MOUTH TWICE A DAY Patient taking differently: Take 20 mg by mouth as needed (Stomach pain).  06/02/17  Yes [name], MD  empagliflozin (JARDIANCE) 10 MG TABS tablet Take 10 mg by mouth daily.   Yes [provider]  insulin glargine (LANTUS) 100 UNIT/ML injection Inject 0.15  mLs (15 Units total) into the skin daily at 10 pm. Patient taking differently: Inject 30 Units into the skin daily at 10 pm.  09/19/19  Yes Bonnell Public, MD  metFORMIN (GLUCOPHAGE) 500 MG tablet Take 500 mg by mouth 2 (two) times daily with a meal.   Yes [provider]  Multiple Vitamins-Minerals (MULTIVITAMIN WITH MINERALS) tablet Take 1 tablet by mouth daily.   Yes [provider]  olmesartan (BENICAR) 5 MG tablet Take 1 tablet (5 mg total) by mouth daily. 09/19/19 10/19/19 Yes Bonnell Public, MD    Physical Exam: Vitals:   10/02/19 0336 10/02/19 0544  BP: (!) 146/79 (!) 142/72  Pulse: 99 92  Resp: (!) 24 19  Temp: 98.6 F (37 C)   TempSrc: Oral   SpO2: 99% 95%  Weight: 108 kg   Height: 5\' 3"  (1.6 m)     Constitutional: NAD, calm, comfortable Vitals:   10/02/19 0336 10/02/19 0544  BP: (!) 146/79 (!) 142/72  Pulse: 99 92  Resp: (!) 24 19  Temp: 98.6 F (37 C)   TempSrc: Oral   SpO2: 99% 95%  Weight: 108 kg   Height: 5\' 3"  (1.6 m)    General: Pleasant, no apparent distress Eyes: PERRL, EOMI, exophthalmos ENMT: Mucous membranes are moist. Posterior pharynx clear of any exudate or lesions.Normal dentition.  Neck: normal, supple, no masses, no thyromegaly Respiratory: clear to auscultation bilaterally, no wheezing, no crackles. Normal respiratory effort. No accessory muscle use.  Cardiovascular: Regular rate and rhythm, no murmurs / rubs / gallops. No extremity edema. 2+ pedal pulses.  Abdomen:  Soft reports tenderness to left upper quadrant left lower quadrant, no rebound or guarding Musculoskeletal: no clubbing / cyanosis. No joint deformity upper and lower extremities. Good ROM, no contractures. Normal muscle tone.  Skin: no rashes, lesions, ulcers. No induration Neurologic: CN grossly intact, no gross focal motor or sensory deficits. Psychiatric: Normal judgment and insight. Alert and oriented x 3. Normal mood.   Labs on Admission: I have  personally reviewed following labs and imaging studies  CBC: Recent Labs  Lab 10/02/19 0409  WBC 13.3*  HGB 9.2*  HCT 31.8*  MCV 78.5*  PLT [id number]*   Basic Metabolic Panel: Recent Labs  Lab 10/02/19 0409  NA 141  K 3.2*  CL 110  CO2 21*  GLUCOSE 160*  BUN 15  CREATININE 0.91  CALCIUM 8.7*   GFR: Estimated Creatinine Clearance: 78.4 mL/min (by C-G formula based on SCr of 0.91 mg/dL). Liver Function Tests: Recent Labs  Lab 10/02/19 0409  AST 22  ALT 20  ALKPHOS 43  BILITOT 0.7  PROT 6.8  ALBUMIN 3.6   No results for input(s): LIPASE, AMYLASE in the last 168 hours. No results for input(s): AMMONIA in the last 168 hours. Coagulation Profile: No results for input(s): INR, PROTIME in the last  168 hours. Cardiac Enzymes: No results for input(s): CKTOTAL, CKMB, CKMBINDEX, TROPONINI in the last 168 hours. BNP (last 3 results) No results for input(s): PROBNP in the last 8760 hours. HbA1C: No results for input(s): HGBA1C in the last 72 hours. CBG: No results for input(s): GLUCAP in the last 168 hours. Lipid Profile: No results for input(s): CHOL, HDL, LDLCALC, TRIG, CHOLHDL, LDLDIRECT in the last 72 hours. Thyroid Function Tests: No results for input(s): TSH, T4TOTAL, FREET4, T3FREE, THYROIDAB in the last 72 hours. Anemia Panel: No results for input(s): VITAMINB12, FOLATE, FERRITIN, TIBC, IRON, RETICCTPCT in the last 72 hours. Urine analysis:    Component Value Date/Time   COLORURINE YELLOW 05/24/2019 1430   APPEARANCEUR HAZY (A) 05/24/2019 1430   LABSPEC 1.020 05/24/2019 1430   PHURINE 5.0 05/24/2019 1430   GLUCOSEU >=500 (A) 05/24/2019 1430   HGBUR NEGATIVE 05/24/2019 1430   BILIRUBINUR NEGATIVE 05/24/2019 1430   BILIRUBINUR neg 08/25/2014 1157   KETONESUR 5 (A) 05/24/2019 1430   PROTEINUR NEGATIVE 05/24/2019 1430   UROBILINOGEN 0.2 08/25/2014 1157   NITRITE NEGATIVE 05/24/2019 1430   LEUKOCYTESUR NEGATIVE 05/24/2019 1430    Radiological Exams on  Admission: No results found.  EKG: Independently reviewed.   Assessment/Plan  Carol Mueller is a 58 y.o. female with medical history significant of hx of unilateral adrenalectomy for Cushing's syndrome s/p adrenalectomy, T2DM/IDDM, HTN, HLD and chronic diarrhea who presents with abdominal discomfort, increased frequency of diarrhea and BRBPR concerning for LGIB.  # Acute Lower GI bleed - suspect this is diverticular bleed given her hx of past diverticular bleed (requiring IR embolization in 2017) - she underwent unremarkable colonoscopy in 2014, but did show diverticulosis - Hgb stable from recent admission, currently 9.2, prior Hgb 8.4  - will consult [facility] now with recurrence and presenting to ER; recently admitted received IV Iron at that time - monitor H/H, IVF, NPO - GI consulted, PA-C Amy Esterwood, appreciate assistance - transfuse to maintain Hgb > 7  # Hypokalemia - suspect 2/2 GI losses, continue to replete  # Leukocytosis - mild, no infectious symptoms, no dysuria, cough, new rash, again suspect stress demargination - seems to occur with each episode of her bleeding and resolves without intervention  # Morbid Obesity # Hepatic steatosis - BMI 42.16 - noted on imaging, weight loss - obesity affects all aspects of care  # T2DM/IDDM - last A1c is 9.1 (05/25/2019) - continue glargine dose-reduced 15 U qHS - ISS - held oral agents  # HTN - hold anti hypertensive agents  # HLD - continue atorvastatin  DVT prophylaxis: SCDs Code Status: Full Emergency Contact: Mother - July Czepiel [id number] Disposition Plan: Obs - anticipate d/c home Consults called: [facility] - awaiting call back   Truddie Hidden MD [facility] Pager [phone]   If 7PM-7AM, please contact night-coverage [email or web address] Password [email or web address]  10/02/2019, 7:51 AM

## 2019-10-02 NOTE — ED Provider Notes (Addendum)
[facility] DEPT Provider Note  CSN: [id number] Arrival date & time: 10/02/19 [id number]  Chief Complaint(s) GI Bleeding  HPI Carol Mueller is a 58 y.o. female with a history of diverticulosis and recurrent diverticular bleeds presents to the emergency department with bright red blood per rectum that began around 2 AM this morning.  Patient has had 3 episodes since onset.  Reports that the second 1 was very large and drained out before she can make it to the bathroom.  Endorses mild abdominal discomfort but no overt pain.  No nausea vomiting.  No recent fevers or infections.  No other physical complaints.  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   Sepsis ([place]) 05/24/2019   Hypokalemia 06/02/2018   Lower GI bleeding 06/02/2018   Insulin-requiring or dependent type II diabetes mellitus ([place])    Back pain with left-sided radiculopathy 09/29/2017   Diarrhea 12/16/2016   Lower GI bleed 11/30/2016   History of GI diverticular bleed    Diverticulosis of colon with hemorrhage 06/05/2016   Ischemic colitis ([place]) 08/07/2015   Abdominal pain    Tachycardia 07/15/2015   Polycystic disease, ovaries 09/12/2012   Class 3 obesity due to excess calories with body mass index (BMI) of 40.0 to 44.9 in adult 09/12/2012   Essential hypertension 09/12/2012   Hyperlipidemia with target LDL less than 100 09/12/2012   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 ([place]) 09/12/2012   Allergic  rhinitis 04/06/2012   Home Medication(s) Prior to Admission medications   Medication Sig Start Date End Date Taking? Authorizing Provider  acetaminophen (TYLENOL) 325 MG tablet Take 2 tablets (650 mg total) by mouth every 6 (six) hours as needed for mild pain (or Fever >/= 101). 09/19/19   [name] I, MD  atorvastatin (LIPITOR) 10 MG tablet Take 10 mg by mouth daily. 03/06/19   [provider]  carvedilol (COREG) 3.125 MG tablet Take 3.125 mg by mouth 2 (two) times daily with a meal.    [provider]  cyclobenzaprine (FLEXERIL) 10 MG tablet Take 10 mg by mouth 3 (three) times daily as needed for muscle spasms. 09/14/19   [provider]  dicyclomine (BENTYL) 20 MG tablet TAKE 1 TABLET BY MOUTH TWICE A DAY Patient taking differently: Take 20 mg by mouth as needed (Stomach pain).  06/02/17   [name], MD  insulin glargine (LANTUS) 100 UNIT/ML injection Inject 0.15 mLs (15 Units total) into the skin daily at 10 pm. 09/19/19   Bonnell Public, MD  Multiple Vitamins-Minerals (MULTIVITAMIN WITH MINERALS) tablet Take 1 tablet by mouth daily.    [provider]  olmesartan (BENICAR) 5 MG tablet Take 1 tablet (5 mg total) by mouth daily. 09/19/19 10/19/19  Bonnell Public, MD  Past Surgical History Past Surgical History:  Procedure Laterality Date   ADRENALECTOMY  1996   CHOLECYSTECTOMY     ESOPHAGOGASTRODUODENOSCOPY (EGD) WITH PROPOFOL N/A 07/16/2015   Procedure: ESOPHAGOGASTRODUODENOSCOPY (EGD) WITH PROPOFOL;  Surgeon: [name], MD;  Location: [facility];  Service: Endoscopy;  Laterality: N/A;   Family History Family History  Problem Relation Age of Onset   Hypertension Mother    Cancer Mother        ?uterine or ovarian cancer   Hypertension Father    Diabetes Father    Hypertension Sister      Diabetes Sister    Hypertension Brother    Diabetes Brother    Hypertension Maternal Grandfather    Diabetes Paternal Grandmother    Breast cancer Maternal Grandmother    Colon cancer Neg Hx     Social History Social History   Tobacco Use   Smoking status: Never Smoker   Smokeless tobacco: Never Used  Substance Use Topics   Alcohol use: No   Drug use: No   Allergies Norvasc [amlodipine besylate] and Nsaids  Review of Systems Review of Systems All other systems are reviewed and are negative for acute change except as noted in the HPI  Physical Exam Vital Signs  I have reviewed the triage vital signs BP (!) 146/79 (BP Location: Left Arm)    Pulse 99    Temp 98.6 F (37 C) (Oral)    Resp (!) 24    Ht 5\' 3"  (1.6 m)    Wt 108 kg    SpO2 99%    BMI 42.16 kg/m   Physical Exam Vitals signs reviewed.  Constitutional:      General: She is not in acute distress.    Appearance: She is well-developed. She is not diaphoretic.  HENT:     Head: Normocephalic and atraumatic.     Right Ear: External ear normal.     Left Ear: External ear normal.     Nose: Nose normal.  Eyes:     General: No scleral icterus.    Conjunctiva/sclera: Conjunctivae normal.  Neck:     Musculoskeletal: Normal range of motion.     Trachea: Phonation normal.  Cardiovascular:     Rate and Rhythm: Normal rate and regular rhythm.  Pulmonary:     Effort: Pulmonary effort is normal. No respiratory distress.     Breath sounds: No stridor.  Abdominal:     General: There is no distension.     Tenderness: There is no abdominal tenderness. There is no guarding or rebound.  Musculoskeletal: Normal range of motion.  Neurological:     Mental Status: She is alert and oriented to person, place, and time.  Psychiatric:        Behavior: Behavior normal.     ED Results and Treatments Labs (all labs ordered are listed, but only abnormal results are displayed) Labs Reviewed  COMPREHENSIVE METABOLIC  PANEL - Abnormal; Notable for the following components:      Result Value   Potassium 3.2 (*)    CO2 21 (*)    Glucose, Bld 160 (*)    Calcium 8.7 (*)    All other components within normal limits  CBC - Abnormal; Notable for the following components:   WBC 13.3 (*)    Hemoglobin 9.2 (*)    HCT 31.8 (*)    MCV 78.5 (*)    MCH 22.7 (*)    MCHC 28.9 (*)    RDW 22.5 (*)  Platelets 429 (*)    All other components within normal limits  HCG, QUANTITATIVE, PREGNANCY - Abnormal; Notable for the following components:   hCG, Beta Chain, Quant, S 9 (*)    All other components within normal limits  TYPE AND SCREEN                                                                                                                         EKG  EKG Interpretation  Date/Time:    Ventricular Rate:    PR Interval:    QRS Duration:   QT Interval:    QTC Calculation:   R Axis:     Text Interpretation:        Radiology No results found.  Pertinent labs & imaging results that were available during my care of the patient were reviewed by me and considered in my medical decision making (see chart for details).  Medications Ordered in ED Medications - No data to display                                                                                                                                  Procedures Procedures CRITICAL CARE Performed by: Grayce Sessions Priya Matsen Total critical care time: 35 minutes Critical care time was exclusive of separately billable procedures and treating other patients. Critical care was necessary to treat or prevent imminent or life-threatening deterioration. Critical care was time spent personally by me on the following activities: development of treatment plan with patient and/or surrogate as well as nursing, discussions with consultants, evaluation of patient's response to treatment, examination of patient, obtaining history from patient or surrogate, ordering and  performing treatments and interventions, ordering and review of laboratory studies, ordering and review of radiographic studies, pulse oximetry and re-evaluation of patient's condition.   (including critical care time)  Medical Decision Making / ED Course I have reviewed the nursing notes for this encounter and the patient's prior records (if available in EHR or on provided paperwork).   Carol Mueller was evaluated in Emergency Department on 10/02/2019 for the symptoms described in the history of present illness. She was evaluated in the context of the global COVID-19 pandemic, which necessitated consideration that the patient might be at risk for infection with the SARS-CoV-2 virus that causes COVID-19. Institutional protocols and algorithms that pertain to the evaluation of patients at risk for COVID-19 are in a state  of rapid change based on information released by regulatory bodies including the CDC and federal and state organizations. These policies and algorithms were followed during the patient's care in the ED.  Patient had a bowel movement in the emergency department and was positive for large volume gross bright red blood.  Hemoglobin 9.2 currently.  CBC hemoconcentrated.  Rest of the labs grossly reassuring.  We will admit for hemoglobin trending due to active lower GI bleed likely from diverticulosis.        Final Clinical Impression(s) / ED Diagnoses Final diagnoses:  Lower GI bleed      This chart was dictated using voice recognition software.  Despite best efforts to proofread,  errors can occur which can change the documentation meaning.   Fatima Blank, MD 10/02/19 0543    Fatima Blank, MD 10/13/19 [phone]

## 2019-10-02 NOTE — ED Triage Notes (Signed)
Patient arrived stating that she has recently been admitted for a GI bleed. States that she has had two bloodly stools since 3am. Patient feeling weak.

## 2019-10-02 NOTE — ED Notes (Signed)
Pt had BM and was instructed not to flush. Bright red blood noted in loose stool.

## 2019-10-02 NOTE — Consult Note (Addendum)
Consultation  Referring Provider: [facility] hospitalist/Chandra MD  primary Care Physician:  [name], DO Primary Gastroenterologist:  [name]  Reason for Consultation: GI bleed  HPI: Carol Mueller is a 58 y.o. female, known to Dr. [name] with history of diverticulosis and previous diverticular bleeding.  Patient also with history of Cushing's status post adrenalectomy, insulin-dependent diabetes mellitus, hypertension and is status post cholecystectomy. She is admitted through the emergency room early this morning after acute onset of large-volume hematochezia at home x2.  She says she was awakened from sleep with the urge for bowel movement and passed a very large amount of dark red blood.  She did not have any associated abdominal pain nausea or vomiting.  After arrival in the emergency room she had 3 more episodes of bleeding and has not had another bowel movement now since about 6 AM. Patient was very recently admitted 11/1 through 09/19/2019 with what was felt to be a diverticular bleed.  Hemoglobin was 11.7 at admission with drop to 7.8 on discharge, she did not require transfusion.  She was scheduled to see Dr. [name] in follow-up in the office to discuss colonoscopy. She has not been on any aspirin or NSAIDs.  She says she had felt fine after discharge from the hospital after her last admission and had been having normal bowel movements.  On admission today hemoglobin 9.2 hematocrit of 31.8 MCV of 78.5, potassium 3.2, BUN 15, creatinine 0.91 Repeat hemoglobin at 10:30 AM today-8.1.  EGD was done in 2016 and normal.  Last colonoscopy February 2014 with moderate diverticulosis in the left and sigmoid colon, no polyps.   Past Medical History:  Diagnosis Date  . Anemia   . Cushing's syndrome ([place])    due to adrenal tumor  . Diabetes mellitus without complication (HCC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  . Diverticulitis   . Diverticulosis   . GI bleed   . Hyperlipidemia   .  Hypertension   . Polycystic ovary disease   . Sepsis ([place]) 05/2019    Past Surgical History:  Procedure Laterality Date  . ADRENALECTOMY  1996  . CHOLECYSTECTOMY    . ESOPHAGOGASTRODUODENOSCOPY (EGD) WITH PROPOFOL N/A 07/16/2015   Procedure: ESOPHAGOGASTRODUODENOSCOPY (EGD) WITH PROPOFOL;  Surgeon: [name], MD;  Location: [facility];  Service: Endoscopy;  Laterality: N/A;    Prior to Admission medications   Medication Sig Start Date End Date Taking? Authorizing Provider  acetaminophen (TYLENOL) 325 MG tablet Take 2 tablets (650 mg total) by mouth every 6 (six) hours as needed for mild pain (or Fever >/= 101). 09/19/19  Yes [name] I, MD  atorvastatin (LIPITOR) 10 MG tablet Take 10 mg by mouth daily. 03/06/19  Yes [provider]  carvedilol (COREG) 3.125 MG tablet Take 3.125 mg by mouth 2 (two) times daily with a meal.   Yes [provider]  cyclobenzaprine (FLEXERIL) 10 MG tablet Take 10 mg by mouth 3 (three) times daily as needed for muscle spasms. 09/14/19  Yes [provider]  dicyclomine (BENTYL) 20 MG tablet TAKE 1 TABLET BY MOUTH TWICE A DAY Patient taking differently: Take 20 mg by mouth as needed (Stomach pain).  06/02/17  Yes [name], MD  empagliflozin (JARDIANCE) 10 MG TABS tablet Take 10 mg by mouth daily.   Yes [provider]  insulin glargine (LANTUS) 100 UNIT/ML injection Inject 0.15 mLs (15 Units total) into the skin daily at 10 pm. Patient taking differently: Inject 30 Units into the skin  daily at 10 pm.  09/19/19  Yes Bonnell Public, MD  metFORMIN (GLUCOPHAGE) 500 MG tablet Take 500 mg by mouth 2 (two) times daily with a meal.   Yes [provider]  Multiple Vitamins-Minerals (MULTIVITAMIN WITH MINERALS) tablet Take 1 tablet by mouth daily.   Yes [provider]  olmesartan (BENICAR) 5 MG tablet Take 1 tablet (5 mg total) by mouth daily. 09/19/19 10/19/19 Yes Bonnell Public, MD     Current Facility-Administered Medications  Medication Dose Route Frequency Provider Last Rate Last Dose  . 0.9 %  sodium chloride infusion   Intravenous Continuous Truddie Hidden, MD 100 mL/hr at 10/02/19 0939    . acetaminophen (TYLENOL) tablet 650 mg  650 mg Oral Q6H PRN Truddie Hidden, MD       Or  . acetaminophen (TYLENOL) suppository 650 mg  650 mg Rectal Q6H PRN Truddie Hidden, MD      . acetaminophen (TYLENOL) tablet 650 mg  650 mg Oral Q6H PRN Truddie Hidden, MD      . atorvastatin (LIPITOR) tablet 10 mg  10 mg Oral Daily Truddie Hidden, MD      . Derrill Memo ON 10/03/2019] canagliflozin [facility]) tablet 100 mg  100 mg Oral QAC breakfast Truddie Hidden, MD      . cyclobenzaprine (FLEXERIL) tablet 10 mg  10 mg Oral TID PRN Truddie Hidden, MD      . dicyclomine (BENTYL) tablet 20 mg  20 mg Oral PRN Truddie Hidden, MD      . Derrill Memo ON 10/03/2019] ferrous sulfate tablet 325 mg  325 mg Oral Q breakfast Truddie Hidden, MD      . insulin aspart (novoLOG) injection 0-9 Units  0-9 Units Subcutaneous Q4H Truddie Hidden, MD      . insulin glargine (LANTUS) injection 15 Units  15 Units Subcutaneous Q2200 Truddie Hidden, MD      . multivitamin with minerals tablet 1 tablet  1 tablet Oral Daily Truddie Hidden, MD      . ondansetron [facility]) tablet 4 mg  4 mg Oral Q6H PRN Truddie Hidden, MD       Or  . ondansetron [facility]) injection 4 mg  4 mg Intravenous Q6H PRN Truddie Hidden, MD        Allergies as of 10/02/2019 - Review Complete 10/02/2019  Allergen Reaction Noted  . Norvasc [amlodipine besylate] Swelling 02/22/2012  . Nsaids Other (See Comments) 08/07/2015    Family History  Problem Relation Age of Onset  . Hypertension Mother   . Cancer Mother        ?uterine or ovarian cancer  . Hypertension Father   . Diabetes Father   . Hypertension Sister   . Diabetes Sister   . Hypertension Brother   . Diabetes Brother   . Hypertension Maternal Grandfather   .  Diabetes Paternal Grandmother   . Breast cancer Maternal Grandmother   . Colon cancer Neg Hx     Social History   Socioeconomic History  . Marital status: Single    Spouse name: N/A  . Number of children: 0  . Years of education: Not on file  . Highest education level: Not on file  Occupational History  . Occupation: NURSE    Comment: [employer]  Social Needs  . Financial resource strain: Not on file  . Food insecurity    Worry: Not on file    Inability: Not on file  . Transportation needs    Medical: Not on file  Non-medical: Not on file  Tobacco Use  . Smoking status: Never Smoker  . Smokeless tobacco: Never Used  Substance and Sexual Activity  . Alcohol use: No  . Drug use: No  . Sexual activity: Not Currently    Birth control/protection: Post-menopausal  Lifestyle  . Physical activity    Days per week: Not on file    Minutes per session: Not on file  . Stress: Not on file  Relationships  . Social Herbalist on phone: Not on file    Gets together: Not on file    Attends religious service: Not on file    Active member of club or organization: Not on file    Attends meetings of clubs or organizations: Not on file    Relationship status: Not on file  . Intimate partner violence    Fear of current or ex partner: Not on file    Emotionally abused: Not on file    Physically abused: Not on file    Forced sexual activity: Not on file  Other Topics Concern  . Not on file  Social History Narrative   Lives alone. Parents live in [place].    Review of Systems: Pertinent positive and negative review of systems were noted in the above HPI section.  All other review of systems was otherwise negative.  Physical Exam: Vital signs in last 24 hours: Temp:  [98.6 F (37 C)-98.8 F (37.1 C)] 98.8 F (37.1 C) (11/16 0943) Pulse Rate:  [84-99] 84 (11/16 0943) Resp:  [17-24] 18 (11/16 0943) BP: (131-146)/(72-85) 131/85 (11/16 0943) SpO2:  [95 %-100 %] 98 %  (11/16 0943) Weight:  [id number] kg] 108 kg (11/16 0336)   General:   Alert,  Well-developed, well-nourished, older African-American female pleasant and cooperative in NAD Head:  Normocephalic and atraumatic. Eyes:  Sclera clear, no icterus.   Conjunctiva pink. Ears:  Normal auditory acuity. Nose:  No deformity, discharge,  or lesions. Mouth:  No deformity or lesions.   Neck:  Supple; no masses or thyromegaly. Lungs:  Clear throughout to auscultation.   No wheezes, crackles, or rhonchi. Heart:  Regular rate and rhythm; no murmurs, clicks, rubs,  or gallops. Abdomen:  Soft,nontender, BS active,nonpalp mass or hsm.   Rectal:  Deferred  Msk:  Symmetrical without gross deformities. . Pulses:  Normal pulses noted. Extremities:  Without clubbing or edema. Neurologic:  Alert and  oriented x4;  grossly normal neurologically. Skin:  Intact without significant lesions or rashes.. Psych:  Alert and cooperative. Normal mood and affect.  Intake/Output from previous day: No intake/output data recorded. Intake/Output this shift: No intake/output data recorded.  Lab Results: Recent Labs    10/02/19 0409 10/02/19 1047  WBC 13.3*  --   HGB 9.2* 8.1*  HCT 31.8* 27.8*  PLT 429*  --    BMET Recent Labs    10/02/19 0409  NA 141  K 3.2*  CL 110  CO2 21*  GLUCOSE 160*  BUN 15  CREATININE 0.91  CALCIUM 8.7*   LFT Recent Labs    10/02/19 0409  PROT 6.8  ALBUMIN 3.6  AST 22  ALT 20  ALKPHOS 43  BILITOT 0.7   PT/INR No results for input(s): LABPROT, INR in the last 72 hours. Hepatitis Panel No results for input(s): HEPBSAG, HCVAB, HEPAIGM, HEPBIGM in the last 72 hours.   IMPRESSION:  #47 58 year old African-American female with history of previous diverticular bleeding and recent admission 2 weeks ago with what was  felt to be a diverticular hemorrhage who was readmitted this morning after acute onset of large-volume hematochezia.  She has been hemodynamically stable.  Hemoglobin was  7.8 on discharge 2 weeks ago and was 9.2 on admission today, down to 8.1 with second check  No bowel movements over the past 6 hours-encouraging, hopefully acute hemorrhage has stopped  #2 anemia-secondary to acute and subacute GI blood loss #3 microcytosis rule out iron deficiency #4 Cushing's status post adrenalectomy #5 adult onset diabetes mellitus-insulin-dependent #6 hypertension #7 status post cholecystectomy  Plan; clear liquid diet Bedrest with bedside commode today Hemoglobin every 6 hours and transfuse for hemoglobin 7 or less If patient has further active bleeding this afternoon will need CT angio and possible embolization. May be prudent to proceed with colonoscopy this admission, will discuss timing with Dr. Loletha Carrow.  Patient remains stable and does not require angiography, consider prep this evening with colonoscopy tomorrow. Thank you, we will follow with you     Amy Esterwood PA-C 10/02/2019, 11:15 AM  I have reviewed the entire case in detail with the above APP and discussed the plan in detail.  Therefore, I agree with the diagnoses recorded above. In addition,  I have personally interviewed and examined the patient and have personally reviewed any abdominal/pelvic CT scan images.  My additional thoughts are as follows:  Recurrent diverticular bleeding, several episodes in last few years, including most recent visit within the last 2 weeks.  I feel we need to exclude other colonic sources such as AVM.  Thus far, we have not been able to catch this on CTA to then allow angiographic intervention.  If other sources excluded on colonoscopy tomorrow, and if she rebleeds, needs repeat CT angiogram and surgical consult.  Office procedure will be done either by me or my partner Dr. Fuller Plan, depending on availability.  She is in the midst of bowel prep right now, tolerating it reasonably well, and we will check her hemoglobin in the morning. Last hemoglobin was 7.7 this afternoon  down from 8.1 earlier this morning but without overt bleeding.  Nelida Meuse III Office:[phone]

## 2019-10-02 NOTE — ED Notes (Signed)
Pt had BM and was instructed not to flush. Bright red blood noticed with loose stool.

## 2019-10-03 ENCOUNTER — Observation Stay (HOSPITAL_COMMUNITY): Payer: No Typology Code available for payment source

## 2019-10-03 ENCOUNTER — Encounter (HOSPITAL_COMMUNITY)
Admission: EM | Disposition: A | Discharge status: Home / Self Care | Payer: Self-pay | Source: Home / Self Care | Attending: Internal Medicine

## 2019-10-03 ENCOUNTER — Observation Stay (HOSPITAL_COMMUNITY): Payer: No Typology Code available for payment source | Admitting: Anesthesiology

## 2019-10-03 ENCOUNTER — Encounter (HOSPITAL_COMMUNITY): Payer: Self-pay | Admitting: Gastroenterology

## 2019-10-03 DIAGNOSIS — Z794 Long term (current) use of insulin: Secondary | ICD-10-CM | POA: Diagnosis not present

## 2019-10-03 DIAGNOSIS — D124 Benign neoplasm of descending colon: Secondary | ICD-10-CM | POA: Diagnosis present

## 2019-10-03 DIAGNOSIS — K5731 Diverticulosis of large intestine without perforation or abscess with bleeding: Secondary | ICD-10-CM | POA: Diagnosis present

## 2019-10-03 DIAGNOSIS — N858 Other specified noninflammatory disorders of uterus: Secondary | ICD-10-CM | POA: Diagnosis present

## 2019-10-03 DIAGNOSIS — K921 Melena: Secondary | ICD-10-CM | POA: Diagnosis present

## 2019-10-03 DIAGNOSIS — D62 Acute posthemorrhagic anemia: Secondary | ICD-10-CM | POA: Diagnosis present

## 2019-10-03 DIAGNOSIS — M62838 Other muscle spasm: Secondary | ICD-10-CM | POA: Diagnosis present

## 2019-10-03 DIAGNOSIS — D123 Benign neoplasm of transverse colon: Secondary | ICD-10-CM

## 2019-10-03 DIAGNOSIS — E785 Hyperlipidemia, unspecified: Secondary | ICD-10-CM | POA: Diagnosis present

## 2019-10-03 DIAGNOSIS — Z888 Allergy status to other drugs, medicaments and biological substances status: Secondary | ICD-10-CM | POA: Diagnosis not present

## 2019-10-03 DIAGNOSIS — E249 Cushing's syndrome, unspecified: Secondary | ICD-10-CM | POA: Diagnosis present

## 2019-10-03 DIAGNOSIS — E119 Type 2 diabetes mellitus without complications: Secondary | ICD-10-CM | POA: Diagnosis present

## 2019-10-03 DIAGNOSIS — Z20828 Contact with and (suspected) exposure to other viral communicable diseases: Secondary | ICD-10-CM | POA: Diagnosis present

## 2019-10-03 DIAGNOSIS — Z833 Family history of diabetes mellitus: Secondary | ICD-10-CM | POA: Diagnosis not present

## 2019-10-03 DIAGNOSIS — K640 First degree hemorrhoids: Secondary | ICD-10-CM | POA: Diagnosis present

## 2019-10-03 DIAGNOSIS — R197 Diarrhea, unspecified: Secondary | ICD-10-CM | POA: Diagnosis present

## 2019-10-03 DIAGNOSIS — K760 Fatty (change of) liver, not elsewhere classified: Secondary | ICD-10-CM | POA: Diagnosis present

## 2019-10-03 DIAGNOSIS — Z79899 Other long term (current) drug therapy: Secondary | ICD-10-CM | POA: Diagnosis not present

## 2019-10-03 DIAGNOSIS — E876 Hypokalemia: Secondary | ICD-10-CM | POA: Diagnosis present

## 2019-10-03 DIAGNOSIS — Z6841 Body Mass Index (BMI) 40.0 and over, adult: Secondary | ICD-10-CM | POA: Diagnosis not present

## 2019-10-03 DIAGNOSIS — I10 Essential (primary) hypertension: Secondary | ICD-10-CM | POA: Diagnosis present

## 2019-10-03 DIAGNOSIS — Z803 Family history of malignant neoplasm of breast: Secondary | ICD-10-CM | POA: Diagnosis not present

## 2019-10-03 DIAGNOSIS — Z8249 Family history of ischemic heart disease and other diseases of the circulatory system: Secondary | ICD-10-CM | POA: Diagnosis not present

## 2019-10-03 DIAGNOSIS — K922 Gastrointestinal hemorrhage, unspecified: Secondary | ICD-10-CM | POA: Diagnosis present

## 2019-10-03 HISTORY — PX: POLYPECTOMY: SHX5525

## 2019-10-03 HISTORY — PX: COLONOSCOPY WITH PROPOFOL: SHX5780

## 2019-10-03 LAB — BASIC METABOLIC PANEL
Anion gap: 7 (ref 5–15)
BUN: 6 mg/dL (ref 6–20)
CO2: 22 mmol/L (ref 22–32)
Calcium: 8 mg/dL — ABNORMAL LOW (ref 8.9–10.3)
Chloride: 113 mmol/L — ABNORMAL HIGH (ref 98–111)
Creatinine, Ser: 0.72 mg/dL (ref 0.44–1.00)
GFR calc Af Amer: >60 mL/min (ref >60)
GFR calc non Af Amer: >60 mL/min (ref >60)
Glucose, Bld: 122 mg/dL — ABNORMAL HIGH (ref 70–99)
Potassium: 3.1 mmol/L — ABNORMAL LOW (ref 3.5–5.1)
Sodium: 142 mmol/L (ref 135–145)

## 2019-10-03 LAB — CBC
HCT: 26.2 % — ABNORMAL LOW (ref 36.0–46.0)
Hemoglobin: 7.6 g/dL — ABNORMAL LOW (ref 12.0–15.0)
MCH: 23.2 pg — ABNORMAL LOW (ref 26.0–34.0)
MCHC: 29 g/dL — ABNORMAL LOW (ref 30.0–36.0)
MCV: 79.9 fL — ABNORMAL LOW (ref 80.0–100.0)
Platelets: 325 10*3/uL (ref 150–400)
RBC: 3.28 MIL/uL — ABNORMAL LOW (ref 3.87–5.11)
RDW: 23 % — ABNORMAL HIGH (ref 11.5–15.5)
WBC: 7.9 10*3/uL (ref 4.0–10.5)
nRBC: 0.4 % — ABNORMAL HIGH (ref 0.0–0.2)

## 2019-10-03 LAB — GLUCOSE, CAPILLARY
Glucose-Capillary: 104 mg/dL — ABNORMAL HIGH (ref 70–99)
Glucose-Capillary: 106 mg/dL — ABNORMAL HIGH (ref 70–99)
Glucose-Capillary: 108 mg/dL — ABNORMAL HIGH (ref 70–99)
Glucose-Capillary: 109 mg/dL — ABNORMAL HIGH (ref 70–99)
Glucose-Capillary: 160 mg/dL — ABNORMAL HIGH (ref 70–99)
Glucose-Capillary: 184 mg/dL — ABNORMAL HIGH (ref 70–99)
Glucose-Capillary: 95 mg/dL (ref 70–99)

## 2019-10-03 LAB — HEMOGLOBIN AND HEMATOCRIT, BLOOD
HCT: 24.9 % — ABNORMAL LOW (ref 36.0–46.0)
HCT: 25.7 % — ABNORMAL LOW (ref 36.0–46.0)
Hemoglobin: 7.2 g/dL — ABNORMAL LOW (ref 12.0–15.0)
Hemoglobin: 7.3 g/dL — ABNORMAL LOW (ref 12.0–15.0)

## 2019-10-03 LAB — PREPARE RBC (CROSSMATCH)

## 2019-10-03 LAB — LACTIC ACID, PLASMA
Lactic Acid, Venous: 0.7 mmol/L (ref 0.5–1.9)
Lactic Acid, Venous: 0.7 mmol/L (ref 0.5–1.9)

## 2019-10-03 LAB — FERRITIN: Ferritin: 75 ng/mL (ref 11–307)

## 2019-10-03 SURGERY — COLONOSCOPY WITH PROPOFOL
Anesthesia: Monitor Anesthesia Care

## 2019-10-03 MED ORDER — POTASSIUM CHLORIDE 10 MEQ/100ML IV SOLN: 10.0000 meq | INTRAVENOUS | Status: DC

## 2019-10-03 MED ORDER — PANTOPRAZOLE SODIUM 40 MG IV SOLR
40.0000 mg | Freq: Two times a day (BID) | INTRAVENOUS | Status: DC
  Administered 2019-10-03 – 2019-10-04 (×2): 40 mg via INTRAVENOUS
  Filled 2019-10-03 (×2): qty 40

## 2019-10-03 MED ORDER — ONDANSETRON HCL 4 MG/2ML IJ SOLN
INTRAMUSCULAR | Status: DC | PRN
  Administered 2019-10-03: 4 mg via INTRAVENOUS

## 2019-10-03 MED ORDER — PROPOFOL 500 MG/50ML IV EMUL
INTRAVENOUS | Status: AC
  Filled 2019-10-03: qty 50

## 2019-10-03 MED ORDER — SODIUM CHLORIDE 0.9% IV SOLUTION: Freq: Once | INTRAVENOUS | Status: DC

## 2019-10-03 MED ORDER — KCL IN DEXTROSE-NACL 40-5-0.9 MEQ/L-%-% IV SOLN
INTRAVENOUS | Status: DC
  Administered 2019-10-03: 21:00:00 via INTRAVENOUS
  Filled 2019-10-03 (×2): qty 1000

## 2019-10-03 MED ORDER — HYDROMORPHONE HCL 1 MG/ML IJ SOLN
1.0000 mg | INTRAMUSCULAR | Status: DC | PRN
  Administered 2019-10-03: 18:00:00 1 mg via INTRAVENOUS
  Filled 2019-10-03: qty 1

## 2019-10-03 MED ORDER — LACTATED RINGERS IV SOLN
INTRAVENOUS | Status: DC
  Administered 2019-10-03: 09:00:00 via INTRAVENOUS

## 2019-10-03 MED ORDER — PROPOFOL 10 MG/ML IV BOLUS
INTRAVENOUS | Status: DC | PRN
  Administered 2019-10-03: 10 mg via INTRAVENOUS

## 2019-10-03 MED ORDER — PROPOFOL 500 MG/50ML IV EMUL
INTRAVENOUS | Status: DC | PRN
  Administered 2019-10-03: 135 ug/kg/min via INTRAVENOUS

## 2019-10-03 SURGICAL SUPPLY — 22 items

## 2019-10-03 NOTE — Op Note (Signed)
[facility] Patient Name: Carol Mueller Procedure Date: 10/03/2019 MRN: [id number] Attending MD: Ladene Artist , MD Date of Birth: May 04, 1961 CSN: [id number] Age: 58 Admit Type: Inpatient Procedure:                Colonoscopy Indications:              Hematochezia Providers:                Pricilla Riffle. Fuller Plan, MD, Burtis Junes, RN, Ladona Ridgel, Technician Referring MD:             [facility] Medicines:                Monitored Anesthesia Care Complications:            No immediate complications. Estimated blood loss:                            None. Estimated Blood Loss:     Estimated blood loss: none. Procedure:                Pre-Anesthesia Assessment:                           - Prior to the procedure, a History and Physical                            was performed, and patient medications and                            allergies were reviewed. The patient's tolerance of                            previous anesthesia was also reviewed. The risks                            and benefits of the procedure and the sedation                            options and risks were discussed with the patient.                            All questions were answered, and informed consent                            was obtained. Prior Anticoagulants: The patient has                            taken no previous anticoagulant or antiplatelet                            agents. ASA Grade Assessment: II - A patient with                            mild systemic disease. After  reviewing the risks                            and benefits, the patient was deemed in                            satisfactory condition to undergo the procedure.                           After obtaining informed consent, the colonoscope                            was passed under direct vision. Throughout the                            procedure, the patient's blood pressure,  pulse, and                            oxygen saturations were monitored continuously. The                            [id number]) Olympus pediatric colonscope                            was introduced through the anus and advanced to the                            the cecum, identified by appendiceal orifice and                            ileocecal valve. The ileocecal valve, appendiceal                            orifice, and rectum were photographed. The quality                            of the bowel preparation was good. The colonoscopy                            was performed without difficulty. The patient                            tolerated the procedure well. Scope In: 9:33:06 AM Scope Out: 9:53:52 AM Scope Withdrawal Time: 0 hours 18 minutes 29 seconds  Total Procedure Duration: 0 hours 20 minutes 46 seconds  Findings:      The perianal and digital rectal examinations were normal.      Two sessile polyps were found in the descending colon and transverse       colon. The polyps were 6 to 8 mm in size. These polyps were removed with       a cold snare. Resection and retrieval were complete.      Multiple medium-mouthed diverticula were found in the right colon. There       was no evidence of diverticular bleeding.      Many medium-mouthed diverticula  were found in the left colon. There was       no evidence of diverticular bleeding.      Internal hemorrhoids were found during retroflexion. The hemorrhoids       were medium-sized and Grade I (internal hemorrhoids that do not       prolapse).      The exam was otherwise without abnormality on direct and retroflexion       views. Impression:               - Two 6 to 8 mm polyps in the descending colon and                            in the transverse colon, removed with a cold snare.                            Resected and retrieved.                           - Mild diverticulosis in the right colon. There was                             no evidence of diverticular bleeding.                           - Moderate diverticulosis in the left colon. There                            was no evidence of diverticular bleeding.                           - Internal hemorrhoids.                           - The examination was otherwise normal on direct                            and retroflexion views. Moderate Sedation:      Not Applicable - Patient had care per Anesthesia. Recommendation:           - Repeat colonoscopy after studies are complete for                            surveillance based on pathology results with Dr.                            Oretha Caprice.                           - Patient has a contact number available for                            emergencies. The signs and symptoms of potential                            delayed complications were discussed with the  patient.                           - Continue present medications.                           - Await pathology results.                           - No aspirin, ibuprofen, naproxen, or other                            non-steroidal anti-inflammatory drugs for 2 weeks                            after polyp removal, diverticular bleed.                           - Suspected diverticular bleed, resolved. Advance                            diet today, monitor Hb and consider discharge                            tomorrow if no recurrent bleeding, Hb stable.                           - Return patient to hospital ward for ongoing care. Procedure Code(s):        --- Professional ---                           [phone], Colonoscopy, flexible; with removal of                            tumor(s), polyp(s), or other lesion(s) by snare                            technique Diagnosis Code(s):        --- Professional ---                           K63.5, Polyp of colon                           K64.0, First degree hemorrhoids                            K92.1, Melena (includes Hematochezia)                           K57.30, Diverticulosis of large intestine without                            perforation or abscess without bleeding CPT copyright 2019 American Medical Association. All rights reserved. The codes documented in this report are preliminary and upon coder review may  be revised to meet current compliance requirements. Ladene Artist,  MD 10/03/2019 10:03:21 AM This report has been signed electronically. Number of Addenda: 0

## 2019-10-03 NOTE — Transfer of Care (Signed)
Immediate Anesthesia Transfer of Care Note  Patient: Carol Mueller  Procedure(s) Performed: COLONOSCOPY WITH PROPOFOL (N/A ) POLYPECTOMY  Patient Location: PACU and Endoscopy Unit  Anesthesia Type:MAC  Level of Consciousness: awake, alert  and oriented  Airway & Oxygen Therapy: Patient Spontanous Breathing and Patient connected to face mask oxygen  Post-op Assessment: Report given to RN and Post -op Vital signs reviewed and stable  Post vital signs: Reviewed and stable  Last Vitals:  Vitals Value Taken Time  BP 130/66 10/03/19 1002  Temp    Pulse 99 10/03/19 1004  Resp 30 10/03/19 1004  SpO2 100 % 10/03/19 1004  Vitals shown include unvalidated device data.  Last Pain:  Vitals:   10/03/19 0838  TempSrc:   PainSc: 0-No pain      Patients Stated Pain Goal: 2 (09/81/19 1478)  Complications: No apparent anesthesia complications

## 2019-10-03 NOTE — Interval H&P Note (Signed)
History and Physical Interval Note:  10/03/2019 9:03 AM  Carol Mueller  has presented today for surgery, with the diagnosis of diverticular bleed.  The various methods of treatment have been discussed with the patient and family. After consideration of risks, benefits and other options for treatment, the patient has consented to  Procedure(s): COLONOSCOPY WITH PROPOFOL (N/A) as a surgical intervention.  The patient's history has been reviewed, patient examined, no change in status, stable for surgery.  I have reviewed the patient's chart and labs.  Questions were answered to the patient's satisfaction.     Pricilla Riffle. Fuller Plan

## 2019-10-03 NOTE — Discharge Instructions (Signed)

## 2019-10-03 NOTE — Progress Notes (Signed)
This shift pt completed and tolerated bowel prep well

## 2019-10-03 NOTE — Anesthesia Preprocedure Evaluation (Signed)
Anesthesia Evaluation  Patient identified by MRN, date of birth, ID band Patient awake    Reviewed: Allergy & Precautions, NPO status , Patient's Chart, lab work & pertinent test results  Airway Mallampati: II  TM Distance: >3 FB     Dental  (+) Dental Advisory Given   Pulmonary neg pulmonary ROS,    breath sounds clear to auscultation       Cardiovascular hypertension, Pt. on home beta blockers and Pt. on medications  Rhythm:Regular Rate:Normal     Neuro/Psych  Neuromuscular disease    GI/Hepatic Neg liver ROS, Diverticular bleed   Endo/Other  diabetes, Type 2, Insulin DependentMorbid obesity  Renal/GU negative Renal ROS     Musculoskeletal   Abdominal   Peds  Hematology  (+) anemia ,   Anesthesia Other Findings   Reproductive/Obstetrics                             Anesthesia Physical Anesthesia Plan  ASA: III  Anesthesia Plan: MAC   Post-op Pain Management:    Induction:   PONV Risk Score and Plan: 2 and Propofol infusion, Ondansetron and Treatment may vary due to age or medical condition  Airway Management Planned: Natural Airway and Simple Face Mask  Additional Equipment:   Intra-op Plan:   Post-operative Plan:   Informed Consent: I have reviewed the patients History and Physical, chart, labs and discussed the procedure including the risks, benefits and alternatives for the proposed anesthesia with the patient or authorized representative who has indicated his/her understanding and acceptance.       Plan Discussed with: CRNA  Anesthesia Plan Comments:         Anesthesia Quick Evaluation

## 2019-10-03 NOTE — Progress Notes (Signed)
PROGRESS NOTE    Carol Mueller  [id number] DOB: 1961/04/20 DOA: 10/02/2019 PCP: [name], DO   Brief Narrative:  Carol Mueller a 58 y.o.femalewith medical history significant ofhx of unilateral adrenalectomy for Cushing's syndrome, T2DM/IDDM,HTN, HLD and chronic diarrhea who presents with abdominal discomfort and recurrence of bloody bowel movements.  November 17.  Patient seen in the morning.  She denies having any more bloody bowel movements or abdominal pain.  She is getting ready to get her colonoscopy.  She denies any other complaints. Asked later in the evening, patient complained of diffuse 10 out of 10 abdominal pain. Lactic acid, x-ray KUB to rule out any perforation after colonoscopy, n.p.o. status, Dilaudid ordered.   12 point review system is negative except for mentioned.   Assessment & Plan:   Active Problems:   GI bleed   Benign neoplasm of transverse colon   Benign neoplasm of descending colon    Carol Mueller a 58 y.o.femalewith medical history significant ofhx of unilateral adrenalectomy for Cushing's syndrome s/p adrenalectomy, T2DM/IDDM,HTN, HLD and chronic diarrhea who presents with abdominal discomfort, increased frequency of diarrhea and BRBPR concerning for LGIB.  # Acute Lower GI bleed - suspect this is diverticular bleed given her hx of past diverticular bleed (requiring IR embolization in 2017) - she underwent unremarkable colonoscopy in 2014, but did show diverticulosis - Hgb stable from recent admission, currently 9.2, prior Hgb 8.4   - monitor H/H, IVF, NPO - GI consulted, PA-C Amy Esterwood, appreciate assistance - transfuse to maintain Hgb > 7  Colonoscopy done by Dr. Lucio Edward revealed diverticulosis along with internal hemorrhoids.  #Diffuse abdominal pain-get lactic acid, keep n.p.o.  Check x-ray. Protonix IV twice daily ivf   #Acute anemia due to blood loss from lower GI tract-we will transfuse 1  unit   # Hypokalemia - suspect 2/2 GI losses, continue to replete  # Leukocytosis - mild, no infectious symptoms, no dysuria, cough, new rash, again suspect stress demargination - seems to occur with each episode of her bleeding and resolves without intervention  # Morbid Obesity # Hepatic steatosis - BMI 42.16 - noted on imaging, weight loss - obesity affects all aspects of care  # T2DM/IDDM - last A1c is 9.1 (05/25/2019) - continue glargine dose-reduced 15 U qHS - ISS - held oral agents  # HTN - hold anti hypertensive agents  # HLD - continue atorvastatin  DVT prophylaxis:SCDs Code Status:Full Emergency Contact:Mother - Carol Mueller [id number] Disposition Plan: ip   As patient has hemoglobin of 7.2 requiring blood transfusion, abdominal pain requiring further monitoring and care.  Consults called:[facility]       Objective: Vitals:   10/03/19 1005 10/03/19 1010 10/03/19 1024 10/03/19 1405  BP: 130/66 135/78 (!) 150/82 (!) 145/84  Pulse: (!) 101 94 96 (!) 49  Resp: (!) 24 (!) 23 16 20   Temp: 97.8 F (36.6 C)   98.3 F (36.8 C)  TempSrc: Oral   Oral  SpO2: 96% 100% 93% 97%  Weight:      Height:        Intake/Output Summary (Last 24 hours) at 10/03/2019 1801 Last data filed at 10/03/2019 1300 Gross per 24 hour  Intake 820 ml  Output -  Net 820 ml   Filed Weights   10/02/19 0336 10/03/19 0408  Weight: 108 kg 111.5 kg    Examination:  General exam: Appears calm and comfortable  Respiratory system: Clear to auscultation. Respiratory effort normal. Cardiovascular  system: S1 & S2 heard, RRR. No JVD, murmurs, rubs, gallops or clicks. No pedal edema. Gastrointestinal system: Abdomen is nondistended, soft and nontender. No organomegaly or masses felt. Normal bowel sounds heard. Central nervous system: Alert and oriented. No focal neurological deficits. Extremities: Symmetric 5 x 5 power. Skin: No rashes, lesions or ulcers Psychiatry:  Judgement and insight appear normal. Mood & affect appropriate.     Data Reviewed: I have personally reviewed following labs and imaging studies  CBC: Recent Labs  Lab 10/02/19 0409 10/02/19 1047 10/02/19 1647 10/02/19 2252 10/03/19 0717 10/03/19 1641  WBC 13.3*  --   --   --  7.9  --   HGB 9.2* 8.1* 7.7* 7.5* 7.6* 7.2*  HCT 31.8* 27.8* 26.3* 26.1* 26.2* 24.9*  MCV 78.5*  --   --   --  79.9*  --   PLT 429*  --   --   --  325  --    Basic Metabolic Panel: Recent Labs  Lab 10/02/19 0409 10/03/19 0717  NA 141 142  K 3.2* 3.1*  CL 110 113*  CO2 21* 22  GLUCOSE 160* 122*  BUN 15 6  CREATININE 0.91 0.72  CALCIUM 8.7* 8.0*   GFR: Estimated Creatinine Clearance: 90.8 mL/min (by C-G formula based on SCr of 0.72 mg/dL). Liver Function Tests: Recent Labs  Lab 10/02/19 0409  AST 22  ALT 20  ALKPHOS 43  BILITOT 0.7  PROT 6.8  ALBUMIN 3.6   No results for input(s): LIPASE, AMYLASE in the last 168 hours. No results for input(s): AMMONIA in the last 168 hours. Coagulation Profile: No results for input(s): INR, PROTIME in the last 168 hours. Cardiac Enzymes: No results for input(s): CKTOTAL, CKMB, CKMBINDEX, TROPONINI in the last 168 hours. BNP (last 3 results) No results for input(s): PROBNP in the last 8760 hours. HbA1C: No results for input(s): HGBA1C in the last 72 hours. CBG: Recent Labs  Lab 10/03/19 0009 10/03/19 0412 10/03/19 0819 10/03/19 1233 10/03/19 1610  GLUCAP 108* 95 104* 184* 106*   Lipid Profile: No results for input(s): CHOL, HDL, LDLCALC, TRIG, CHOLHDL, LDLDIRECT in the last 72 hours. Thyroid Function Tests: No results for input(s): TSH, T4TOTAL, FREET4, T3FREE, THYROIDAB in the last 72 hours. Anemia Panel: Recent Labs    10/02/19 1647 10/03/19 0717  FERRITIN  --  75  TIBC 272  --   IRON 47  --    Sepsis Labs: No results for input(s): PROCALCITON, LATICACIDVEN in the last 168 hours.  Recent Results (from the past 240 hour(s))   SARS CORONAVIRUS 2 (TAT 6-24 HRS) Nasopharyngeal Nasopharyngeal Swab     Status: None   Collection Time: 10/02/19  6:33 AM   Specimen: Nasopharyngeal Swab  Result Value Ref Range Status   SARS Coronavirus 2 NEGATIVE NEGATIVE Final    Comment: (NOTE) SARS-CoV-2 target nucleic acids are NOT DETECTED. The SARS-CoV-2 RNA is generally detectable in upper and lower respiratory specimens during the acute phase of infection. Negative results do not preclude SARS-CoV-2 infection, do not rule out co-infections with other pathogens, and should not be used as the sole basis for treatment or other patient management decisions. Negative results must be combined with clinical observations, patient history, and epidemiological information. The expected result is Negative. Fact Sheet for Patients: SugarRoll.be Fact Sheet for Healthcare Providers: https://www.woods-mathews.com/ This test is not yet approved or cleared by the [place] FDA and  has been authorized for detection and/or diagnosis of SARS-CoV-2 by FDA under an  Emergency Use Authorization (EUA). This EUA will remain  in effect (meaning this test can be used) for the duration of the COVID-19 declaration under Section 56 4(b)(1) of the Act, 21 U.S.C. section 360bbb-3(b)(1), unless the authorization is terminated or revoked sooner. Performed at [facility], [place] [address] [place], [place] [address]          Radiology Studies: No results found.      Scheduled Meds: . sodium chloride   Intravenous Once  . atorvastatin  10 mg Oral Daily  . canagliflozin  100 mg Oral QAC breakfast  . insulin aspart  0-9 Units Subcutaneous Q4H  . insulin glargine  15 Units Subcutaneous Q2200  . multivitamin with minerals  1 tablet Oral Daily  . pantoprazole (PROTONIX) IV  40 mg Intravenous Q12H   Continuous Infusions: . sodium chloride 100 mL/hr at 10/03/19 0450  . potassium chloride        LOS: 0 days    Time spent:     Kellyn Mccary Harmon Pier, MD [facility] Pager [phone]  If 7PM-7AM, please contact night-coverage [email or web address] Password [email or web address] 10/03/2019, 6:01 PM

## 2019-10-04 ENCOUNTER — Other Ambulatory Visit: Payer: Self-pay | Admitting: Internal Medicine

## 2019-10-04 DIAGNOSIS — K5731 Diverticulosis of large intestine without perforation or abscess with bleeding: Secondary | ICD-10-CM

## 2019-10-04 LAB — TYPE AND SCREEN
ABO/RH(D): O POS
Antibody Screen: NEGATIVE
Unit division: 0

## 2019-10-04 LAB — BASIC METABOLIC PANEL
Anion gap: 6 (ref 5–15)
BUN: <5 mg/dL — ABNORMAL LOW (ref 6–20)
CO2: 22 mmol/L (ref 22–32)
Calcium: 8.3 mg/dL — ABNORMAL LOW (ref 8.9–10.3)
Chloride: 112 mmol/L — ABNORMAL HIGH (ref 98–111)
Creatinine, Ser: 0.62 mg/dL (ref 0.44–1.00)
GFR calc Af Amer: >60 mL/min (ref >60)
GFR calc non Af Amer: >60 mL/min (ref >60)
Glucose, Bld: 141 mg/dL — ABNORMAL HIGH (ref 70–99)
Potassium: 3.5 mmol/L (ref 3.5–5.1)
Sodium: 140 mmol/L (ref 135–145)

## 2019-10-04 LAB — SURGICAL PATHOLOGY

## 2019-10-04 LAB — CBC WITH DIFFERENTIAL/PLATELET
Abs Immature Granulocytes: 0.05 10*3/uL (ref 0.00–0.07)
Basophils Absolute: 0.1 10*3/uL (ref 0.0–0.1)
Basophils Relative: 1 %
Eosinophils Absolute: 0.4 10*3/uL (ref 0.0–0.5)
Eosinophils Relative: 5 %
HCT: 28 % — ABNORMAL LOW (ref 36.0–46.0)
Hemoglobin: 8.2 g/dL — ABNORMAL LOW (ref 12.0–15.0)
Immature Granulocytes: 1 %
Lymphocytes Relative: 32 %
Lymphs Abs: 2.9 10*3/uL (ref 0.7–4.0)
MCH: 23.9 pg — ABNORMAL LOW (ref 26.0–34.0)
MCHC: 29.3 g/dL — ABNORMAL LOW (ref 30.0–36.0)
MCV: 81.6 fL (ref 80.0–100.0)
Monocytes Absolute: 0.7 10*3/uL (ref 0.1–1.0)
Monocytes Relative: 8 %
Neutro Abs: 4.8 10*3/uL (ref 1.7–7.7)
Neutrophils Relative %: 53 %
Platelets: 304 10*3/uL (ref 150–400)
RBC: 3.43 MIL/uL — ABNORMAL LOW (ref 3.87–5.11)
RDW: 22.5 % — ABNORMAL HIGH (ref 11.5–15.5)
WBC: 9 10*3/uL (ref 4.0–10.5)
nRBC: 0.2 % (ref 0.0–0.2)

## 2019-10-04 LAB — GLUCOSE, CAPILLARY
Glucose-Capillary: 155 mg/dL — ABNORMAL HIGH (ref 70–99)
Glucose-Capillary: 128 mg/dL — ABNORMAL HIGH (ref 70–99)
Glucose-Capillary: 134 mg/dL — ABNORMAL HIGH (ref 70–99)
Glucose-Capillary: 161 mg/dL — ABNORMAL HIGH (ref 70–99)

## 2019-10-04 LAB — BPAM RBC
Blood Product Expiration Date: 202012162359
ISSUE DATE / TIME: 202011172134
Unit Type and Rh: 5100

## 2019-10-04 LAB — HEMOGLOBIN AND HEMATOCRIT, BLOOD
HCT: 28.6 % — ABNORMAL LOW (ref 36.0–46.0)
Hemoglobin: 8.2 g/dL — ABNORMAL LOW (ref 12.0–15.0)

## 2019-10-04 NOTE — Progress Notes (Signed)
Cbc ordered for 11/23 at my office Will coordinate f/u after that is seen

## 2019-10-04 NOTE — Progress Notes (Addendum)
   Patient Name: Carol Mueller Date of Encounter: 10/04/2019, 11:24 AM    Subjective  No stools, no bleeding Some abd pain last night ok now Xray was neg   Objective  BP 133/68 (BP Location: Left Arm)   Pulse 83   Temp 97.9 F (36.6 C) (Oral)   Resp 16   Ht 5\' 3"  (1.6 m)   Wt 112.5 kg   SpO2 98%   BMI 43.93 kg/m   CBC Latest Ref Rng & Units 10/04/2019 10/04/2019 10/03/2019  WBC 4.0 - 10.5 K/uL 9.0 - -  Hemoglobin 12.0 - 15.0 g/dL 8.2(L) 8.2(L) 7.3(L)  Hematocrit 36.0 - 46.0 % 28.0(L) 28.6(L) 25.7(L)  Platelets 150 - 400 K/uL 304 - -      Assessment and Plan  Diverticulosis with hemorrhage - resolved Acute blood loss anemia 2 colon polyps removed yesterday   She can go home today I have ordered a CBC for 11/23 to f/u - documented in f/u section  Please tell her to take ferrous sulfate 325 mg qd Aoid NSAIDS and ASA - not using that I know but avoid  Gatha Mayer, MD, [facility] 10/04/2019 11:24 AM

## 2019-10-04 NOTE — Discharge Summary (Addendum)
Physician Discharge Summary  Carol Mueller [id number] DOB: 05/25/1961 DOA: 10/02/2019  PCP: [name], DO  Admit date: 10/02/2019 Discharge date: 10/04/2019  Admitted From: Home  Disposition:    Recommendations for Outpatient Follow-up:  1. Follow up with PCP in 1-2 weeks 2. Please obtain BMP/CBC in one week 3. Please follow up on the following pending results:  Home Health:  Equipment/Devices:  Discharge Condition: stable   CODE STATUS: full   Diet recommendation: Heart Healthy     Brief/Interim Summary: Carol Mueller a 58 y.o.femalewith medical history significant ofhx of unilateral adrenalectomy for Cushing's syndrome, T2DM/IDDM,HTN, HLD and chronic diarrhea who presents with abdominal discomfort and recurrence of bloody bowel movements.  Patient reports she was in her usual state of health through last night and had onset of BRBPR since the middle of this morning.  She states it was very bloody filled the entire toilet bowl.  She has since had 4 more bloody bowel movements, with the most recent one being around 6 AM.  Patient states she also has left-sided abdominal discomfort.  She denies any fevers, chills, cough, shortness of breath, chest pain, palpitations.  She does report a little bit of lightheadedness but not to the point that she feels she will pass out.  She was recently here 2 weeks prior with similar complaints.  She reports that decided that colonoscopy could be done as an outpatient and she was scheduled to follow-up on December 14.  Patient has not eaten anything since 4:30 PM yesterday.  She has continued to avoid aspirin, NSAIDs, other medications that may contribute.  She states she stopped taking fish oil.   Patient was mated and evaluated by gastroenterologist.  She had a colonoscopy that revealed moderate diverticulosis in both right and left colon along with internal hemorrhoids.  She did not have any more further episodes of  bleeding.  She received 1 unit of blood transfusion after which her hemoglobin came up to 8.  She was monitored in hospital for 48 hours with no further bleeding.  She does take oral iron at home.  Post colonoscopy, she had abdominal pain after resuming diet.  X-ray abdomen and lactic acid were unremarkable.  The next day, on November 18, she did not have any complaints and so she was discharged home to follow-up with PCP in 1 week and gastroenterology on Monday with CBC to monitor hemoglobin.  She has been advised to avoid any NSAIDs or aspirin.  She does take Nexium as needed for acid reflux/gastritis.  During her stay, her chronic medical conditions were treated with home medications. pcp to follow up on calcified uterus    Discharge Diagnoses:  Active Problems:   GI bleed   Benign neoplasm of transverse colon   Benign neoplasm of descending colon   Hematochezia  Acute lower GI bleed due to diverticulosis  Discharge Instructions  Discharge Instructions    Diet - low sodium heart healthy   Complete by: As directed    Increase activity slowly   Complete by: As directed      Allergies as of 10/04/2019      Reactions   Norvasc [amlodipine Besylate] Swelling   ANGIOEDEMA   Nsaids Other (See Comments)   GI upset/ GI bleed      Medication List    TAKE these medications   acetaminophen 325 MG tablet Commonly known as: TYLENOL Take 2 tablets (650 mg total) by mouth every 6 (six) hours as needed for mild pain (  or Fever >/= 101).   atorvastatin 10 MG tablet Commonly known as: LIPITOR Take 10 mg by mouth daily.   carvedilol 3.125 MG tablet Commonly known as: COREG Take 3.125 mg by mouth 2 (two) times daily with a meal.   cyclobenzaprine 10 MG tablet Commonly known as: FLEXERIL Take 10 mg by mouth 3 (three) times daily as needed for muscle spasms.   dicyclomine 20 MG tablet Commonly known as: BENTYL TAKE 1 TABLET BY MOUTH TWICE A DAY What changed:   when to take  this  reasons to take this   insulin glargine 100 UNIT/ML injection Commonly known as: LANTUS Inject 0.15 mLs (15 Units total) into the skin daily at 10 pm. What changed: how much to take   Jardiance 10 MG Tabs tablet Generic drug: empagliflozin Take 10 mg by mouth daily.   metFORMIN 500 MG tablet Commonly known as: GLUCOPHAGE Take 500 mg by mouth 2 (two) times daily with a meal.   multivitamin with minerals tablet Take 1 tablet by mouth daily.   olmesartan 5 MG tablet Commonly known as: BENICAR Take 1 tablet (5 mg total) by mouth daily.      Follow-up Information    Gatha Mayer, MD. Go on 10/09/2019.   Specialty: Gastroenterology Why: Lab in basement and have CBC checked and will call results and plans Contact information: [address] [place] [address] [phone]          Allergies  Allergen Reactions  . Norvasc [Amlodipine Besylate] Swelling    ANGIOEDEMA  . Nsaids Other (See Comments)    GI upset/ GI bleed    Consultations:     Procedures/Studies: Dg Abd 1 View  Result Date: 10/03/2019 CLINICAL DATA:  Abdominal pain EXAM: ABDOMEN - 1 VIEW COMPARISON:  09/17/2019 FINDINGS: Nonobstructive bowel gas pattern. Multiple surgical clips within the right upper quadrant. Coarse calcifications within the pelvis from calcified uterine fibroids. IMPRESSION: 1. Nonobstructive bowel gas pattern. 2. Calcified, fibroid uterus. Electronically Signed   By: Davina Poke M.D.   On: 10/03/2019 18:48   Ct Abdomen Pelvis W Contrast  Result Date: 09/17/2019 CLINICAL DATA:  Lower pelvic pain and diarrhea since this morning. EXAM: CT ABDOMEN AND PELVIS WITH CONTRAST TECHNIQUE: Multidetector CT imaging of the abdomen and pelvis was performed using the standard protocol following bolus administration of intravenous contrast. CONTRAST:  65mL OMNIPAQUE IOHEXOL 300 MG/ML  SOLN COMPARISON:  06/02/2018 . FINDINGS: Lower chest: Unremarkable. Hepatobiliary: Diffuse low  density of the liver relative to the spleen is again demonstrated. Cholecystectomy clips. Pancreas: Unremarkable. No pancreatic ductal dilatation or surrounding inflammatory changes. Spleen: Normal in size without focal abnormality. Adrenals/Urinary Tract: Surgically absent right adrenal gland with surgical clips. Normal appearing left adrenal gland. Bilateral simple appearing renal cysts. Unremarkable urinary bladder and ureters. Stomach/Bowel: Large number of colonic diverticula without evidence of diverticulitis. Normal appearing rectum. Normal appearing appendix, small bowel and stomach. Vascular/Lymphatic: No significant vascular findings are present. No enlarged abdominal or pelvic lymph nodes. Reproductive: Multiple calcified uterine fibroids are again noted. No adnexal mass. Other: Small right inguinal hernia containing fat. Small umbilical hernia containing fat. Musculoskeletal: Lumbar and lower thoracic spine degenerative changes. IMPRESSION: 1. No acute abnormality. 2. Stable diffuse hepatic steatosis. 3. Colonic diverticulosis. 4. Calcified uterine fibroids. 5. Small right inguinal and umbilical hernias containing fat. Electronically Signed   By: Claudie Revering M.D.   On: 09/17/2019 09:16       Subjective:   Discharge Exam: Vitals:   10/04/19  0352 10/04/19 1349  BP: 133/68 (!) 153/72  Pulse: 83 92  Resp: 16 18  Temp: 97.9 F (36.6 C) 99.1 F (37.3 C)  SpO2: 98% 99%   Vitals:   10/03/19 2154 10/04/19 0016 10/04/19 0352 10/04/19 1349  BP: 123/68 122/79 133/68 (!) 153/72  Pulse: 86 87 83 92  Resp: 14 16 16 18   Temp: 98.4 F (36.9 C) 98.2 F (36.8 C) 97.9 F (36.6 C) 99.1 F (37.3 C)  TempSrc: Oral Oral Oral Oral  SpO2: 100% 100% 98% 99%  Weight:   112.5 kg   Height:        General: Pt is alert, awake, not in acute distress Cardiovascular: RRR, S1/S2 +, no rubs, no gallops Respiratory: CTA bilaterally, no wheezing, no rhonchi Abdominal: Soft, NT, ND, bowel sounds  + Extremities: no edema, no cyanosis    The results of significant diagnostics from this hospitalization (including imaging, microbiology, ancillary and laboratory) are listed below for reference.     Microbiology: Recent Results (from the past 240 hour(s))  SARS CORONAVIRUS 2 (TAT 6-24 HRS) Nasopharyngeal Nasopharyngeal Swab     Status: None   Collection Time: 10/02/19  6:33 AM   Specimen: Nasopharyngeal Swab  Result Value Ref Range Status   SARS Coronavirus 2 NEGATIVE NEGATIVE Final    Comment: (NOTE) SARS-CoV-2 target nucleic acids are NOT DETECTED. The SARS-CoV-2 RNA is generally detectable in upper and lower respiratory specimens during the acute phase of infection. Negative results do not preclude SARS-CoV-2 infection, do not rule out co-infections with other pathogens, and should not be used as the sole basis for treatment or other patient management decisions. Negative results must be combined with clinical observations, patient history, and epidemiological information. The expected result is Negative. Fact Sheet for Patients: SugarRoll.be Fact Sheet for Healthcare Providers: https://www.woods-mathews.com/ This test is not yet approved or cleared by the [place] FDA and  has been authorized for detection and/or diagnosis of SARS-CoV-2 by FDA under an Emergency Use Authorization (EUA). This EUA will remain  in effect (meaning this test can be used) for the duration of the COVID-19 declaration under Section 56 4(b)(1) of the Act, 21 U.S.C. section 360bbb-3(b)(1), unless the authorization is terminated or revoked sooner. Performed at [facility], [place] [address] [place], [place] [address]      Labs: BNP (last 3 results) No results for input(s): BNP in the last 8760 hours. Basic Metabolic Panel: Recent Labs  Lab 10/02/19 0409 10/03/19 0717 10/04/19 0540  NA 141 142 140  K 3.2* 3.1* 3.5  CL 110 113* 112*  CO2  21* 22 22  GLUCOSE 160* 122* 141*  BUN 15 6 <5*  CREATININE 0.91 0.72 0.62  CALCIUM 8.7* 8.0* 8.3*   Liver Function Tests: Recent Labs  Lab 10/02/19 0409  AST 22  ALT 20  ALKPHOS 43  BILITOT 0.7  PROT 6.8  ALBUMIN 3.6   No results for input(s): LIPASE, AMYLASE in the last 168 hours. No results for input(s): AMMONIA in the last 168 hours. CBC: Recent Labs  Lab 10/02/19 0409  10/02/19 2252 10/03/19 0717 10/03/19 1641 10/03/19 2026 10/04/19 0540  WBC 13.3*  --   --  7.9  --   --  9.0  NEUTROABS  --   --   --   --   --   --  4.8  HGB 9.2*   < > 7.5* 7.6* 7.2* 7.3* 8.2*  8.2*  HCT 31.8*   < > 26.1*  26.2* 24.9* 25.7* 28.0*  28.6*  MCV 78.5*  --   --  79.9*  --   --  81.6  PLT 429*  --   --  325  --   --  304   < > = values in this interval not displayed.   Cardiac Enzymes: No results for input(s): CKTOTAL, CKMB, CKMBINDEX, TROPONINI in the last 168 hours. BNP: Invalid input(s): POCBNP CBG: Recent Labs  Lab 10/03/19 2340 10/04/19 0355 10/04/19 0800 10/04/19 1155 10/04/19 1604  GLUCAP 160* 155* 128* 134* 161*   D-Dimer No results for input(s): DDIMER in the last 72 hours. Hgb A1c No results for input(s): HGBA1C in the last 72 hours. Lipid Profile No results for input(s): CHOL, HDL, LDLCALC, TRIG, CHOLHDL, LDLDIRECT in the last 72 hours. Thyroid function studies No results for input(s): TSH, T4TOTAL, T3FREE, THYROIDAB in the last 72 hours.  Invalid input(s): FREET3 Anemia work up Recent Labs    10/02/19 1647 10/03/19 0717  FERRITIN  --  75  TIBC 272  --   IRON 47  --    Urinalysis    Component Value Date/Time   COLORURINE YELLOW 05/24/2019 1430   APPEARANCEUR HAZY (A) 05/24/2019 1430   LABSPEC 1.020 05/24/2019 1430   PHURINE 5.0 05/24/2019 1430   GLUCOSEU >=500 (A) 05/24/2019 1430   HGBUR NEGATIVE 05/24/2019 1430   BILIRUBINUR NEGATIVE 05/24/2019 1430   BILIRUBINUR neg 08/25/2014 1157   KETONESUR 5 (A) 05/24/2019 1430   PROTEINUR NEGATIVE  05/24/2019 1430   UROBILINOGEN 0.2 08/25/2014 1157   NITRITE NEGATIVE 05/24/2019 1430   LEUKOCYTESUR NEGATIVE 05/24/2019 1430   Sepsis Labs Invalid input(s): PROCALCITONIN,  WBC,  LACTICIDVEN Microbiology Recent Results (from the past 240 hour(s))  SARS CORONAVIRUS 2 (TAT 6-24 HRS) Nasopharyngeal Nasopharyngeal Swab     Status: None   Collection Time: 10/02/19  6:33 AM   Specimen: Nasopharyngeal Swab  Result Value Ref Range Status   SARS Coronavirus 2 NEGATIVE NEGATIVE Final    Comment: (NOTE) SARS-CoV-2 target nucleic acids are NOT DETECTED. The SARS-CoV-2 RNA is generally detectable in upper and lower respiratory specimens during the acute phase of infection. Negative results do not preclude SARS-CoV-2 infection, do not rule out co-infections with other pathogens, and should not be used as the sole basis for treatment or other patient management decisions. Negative results must be combined with clinical observations, patient history, and epidemiological information. The expected result is Negative. Fact Sheet for Patients: SugarRoll.be Fact Sheet for Healthcare Providers: https://www.woods-mathews.com/ This test is not yet approved or cleared by the [place] FDA and  has been authorized for detection and/or diagnosis of SARS-CoV-2 by FDA under an Emergency Use Authorization (EUA). This EUA will remain  in effect (meaning this test can be used) for the duration of the COVID-19 declaration under Section 56 4(b)(1) of the Act, 21 U.S.C. section 360bbb-3(b)(1), unless the authorization is terminated or revoked sooner. Performed at [facility], [place] [address] [place], [place] [address]      Time coordinating discharge: Over 30 minutes  SIGNED:   Vicenta Dunning, MD  [facility] 10/04/2019, 4:28 PM Pager   If 7PM-7AM, please contact night-coverage [email or web address] Password [email or web address]

## 2019-10-04 NOTE — Anesthesia Postprocedure Evaluation (Signed)
Anesthesia Post Note  Patient: Carol Mueller  Procedure(s) Performed: COLONOSCOPY WITH PROPOFOL (N/A ) POLYPECTOMY     Patient location during evaluation: PACU Anesthesia Type: MAC Level of consciousness: awake and alert Pain management: pain level controlled Vital Signs Assessment: post-procedure vital signs reviewed and stable Respiratory status: spontaneous breathing, nonlabored ventilation, respiratory function stable and patient connected to nasal cannula oxygen Cardiovascular status: stable and blood pressure returned to baseline Postop Assessment: no apparent nausea or vomiting Anesthetic complications: no    Last Vitals:  Vitals:   10/04/19 0016 10/04/19 0352  BP: 122/79 133/68  Pulse: 87 83  Resp: 16 16  Temp: 36.8 C 36.6 C  SpO2: 100% 98%    Last Pain:  Vitals:   10/04/19 0352  TempSrc: Oral  PainSc:                  Tiajuana Amass

## 2019-10-05 ENCOUNTER — Encounter (HOSPITAL_COMMUNITY): Payer: Self-pay | Admitting: Gastroenterology

## 2019-10-05 ENCOUNTER — Encounter: Payer: Self-pay | Admitting: Gastroenterology

## 2019-10-09 ENCOUNTER — Other Ambulatory Visit (INDEPENDENT_AMBULATORY_CARE_PROVIDER_SITE_OTHER): Payer: 59

## 2019-10-09 DIAGNOSIS — K5731 Diverticulosis of large intestine without perforation or abscess with bleeding: Secondary | ICD-10-CM

## 2019-10-09 LAB — CBC
HCT: 32.1 % — ABNORMAL LOW (ref 36.0–46.0)
Hemoglobin: 10 g/dL — ABNORMAL LOW (ref 12.0–15.0)
MCHC: 31.2 g/dL (ref 30.0–36.0)
MCV: 76.1 fl — ABNORMAL LOW (ref 78.0–100.0)
Platelets: 420 10*3/uL — ABNORMAL HIGH (ref 150.0–400.0)
RBC: 4.22 Mil/uL (ref 3.87–5.11)
RDW: 22.8 % — ABNORMAL HIGH (ref 11.5–15.5)
WBC: 8.6 10*3/uL (ref 4.0–10.5)

## 2019-10-16 NOTE — Progress Notes (Signed)
Carol Mueller,  Hemoglobin is improving - up to 10 from 8.2.  Please take ferrous sulfate 325 mg daily if not doing so.  You have a follow-up with Dr. [name] 12/14.  Best regards,  Gatha Mayer, MD, [facility]

## 2019-10-30 ENCOUNTER — Other Ambulatory Visit: Payer: Self-pay

## 2019-10-30 ENCOUNTER — Ambulatory Visit (INDEPENDENT_AMBULATORY_CARE_PROVIDER_SITE_OTHER): Payer: 59 | Admitting: Gastroenterology

## 2019-10-30 ENCOUNTER — Encounter: Payer: Self-pay | Admitting: Gastroenterology

## 2019-10-30 VITALS — BP 140/86 | HR 86 | Temp 97.4°F | Ht 63.5 in | Wt 238.5 lb

## 2019-10-30 DIAGNOSIS — Z8719 Personal history of other diseases of the digestive system: Secondary | ICD-10-CM

## 2019-10-30 NOTE — Progress Notes (Signed)
Editor: [name], MD (Physician)   Review of pertinent gastrointestinal problems: 1. Routine risk for colon cancer, colonoscopy Dr. Verl Blalock 12/2012 found diverticulosis only. He recommended repeat colon cancer screening with colonoscopy at 10 year interval. Colonoscopy November 2020 during hospital admission for recurrent GI bleeding found 2 subcentimeter polyps, 1 of these was adenomatous.  Recommended recall 09/2026. 2. Recurrent "Acute diverticular bleeding" in proximal descending colon July 2017. This was documented on CT angiogram during her bleeding episode.  She went straight to angiogram by interventional radiology and there was no active bleeding at the time of that exam. Also overt GI bleeding episode 07/2015: did not need blood transfusion then.  Colonoscopy November 2020 during hospital admission for recurrent GI bleeding found pan diverticulosis and also internal hemorrhoids.    HPI: This is a very pleasant 58 year old woman whom I last saw little over a year ago.  She was hospitalized twice in November for 3-day admissions for lower GI bleeding.  These were both presumed to be acute diverticular bleeds.  She required 1 unit of blood total throughout those 2 admissions.  She has received 3 IV iron infusions since then  She is not taking iron orally as it upsets her stomach  She has had no GI bleeding since she left the hospital the second time   CBC October 09, 2019 hemoglobin 10    ROS: complete GI ROS as described in HPI, all other review negative.  Constitutional:  No unintentional weight loss   Past Medical History:  Diagnosis Date  . Anemia   . 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 Diabetes mellitus without complication (HCC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  . Diverticulitis   . Diverticulosis   . GI bleed   . Hyperlipidemia   . Hypertension   . Polycystic ovary disease   . Sepsis ([place]) 05/2019    Past Surgical History:  Procedure  Laterality Date  . ADRENALECTOMY  1996  . CHOLECYSTECTOMY    . COLONOSCOPY WITH PROPOFOL N/A 10/03/2019   Procedure: COLONOSCOPY WITH PROPOFOL;  Surgeon: Ladene Artist, MD;  Location: [facility];  Service: Endoscopy;  Laterality: N/A;  . ESOPHAGOGASTRODUODENOSCOPY (EGD) WITH PROPOFOL N/A 07/16/2015   Procedure: ESOPHAGOGASTRODUODENOSCOPY (EGD) WITH PROPOFOL;  Surgeon: [name], MD;  Location: [facility];  Service: Endoscopy;  Laterality: N/A;  . POLYPECTOMY  10/03/2019   Procedure: POLYPECTOMY;  Surgeon: Ladene Artist, MD;  Location: [facility];  Service: Endoscopy;;    Current Outpatient Medications  Medication Sig Dispense Refill  . acetaminophen (TYLENOL) 325 MG tablet Take 2 tablets (650 mg total) by mouth every 6 (six) hours as needed for mild pain (or Fever >/= 101). 40 tablet 0  . atorvastatin (LIPITOR) 10 MG tablet Take 10 mg by mouth daily.    . carvedilol (COREG) 3.125 MG tablet Take 3.125 mg by mouth 2 (two) times daily with a meal.    . cyclobenzaprine (FLEXERIL) 10 MG tablet Take 10 mg by mouth 3 (three) times daily as needed for muscle spasms.    Marland Kitchen dicyclomine (BENTYL) 20 MG tablet TAKE 1 TABLET BY MOUTH TWICE A DAY (Patient taking differently: Take 20 mg by mouth as needed (Stomach pain). ) 60 tablet 0  . empagliflozin (JARDIANCE) 10 MG TABS tablet Take 10 mg by mouth daily.    . insulin glargine (LANTUS) 100 UNIT/ML injection Inject 0.15 mLs (15 Units total) into the skin daily at 10 pm. (Patient taking differently: Inject  30 Units into the skin daily at 10 pm. ) 10 mL 11  . metFORMIN (GLUCOPHAGE) 500 MG tablet Take 500 mg by mouth 2 (two) times daily with a meal.    . olmesartan (BENICAR) 40 MG tablet Take 40 mg by mouth daily.    . Multiple Vitamins-Minerals (MULTIVITAMIN WITH MINERALS) tablet Take 1 tablet by mouth daily.     Current Facility-Administered Medications  Medication Dose Route Frequency Provider Last Rate Last Admin  . ferrous sulfate tablet  325 mg  325 mg Oral Q breakfast Bonnell Public, MD        Allergies as of 10/30/2019 - Review Complete 10/30/2019  Allergen Reaction Noted  . Norvasc [amlodipine besylate] Swelling 02/22/2012  . Nsaids Other (See Comments) 08/07/2015    Family History  Problem Relation Age of Onset  . Hypertension Mother   . Cancer Mother        ?uterine or ovarian cancer  . Hypertension Father   . Diabetes Father   . Hypertension Sister   . Diabetes Sister   . Hypertension Brother   . Diabetes Brother   . Hypertension Maternal Grandfather   . Diabetes Paternal Grandmother   . Breast cancer Maternal Grandmother   . Colon cancer Neg Hx     Social History   Socioeconomic History  . Marital status: Single    Spouse name: N/A  . Number of children: 0  . Years of education: Not on file  . Highest education level: Not on file  Occupational History  . Occupation: NURSE    Comment: [employer]  Tobacco Use  . Smoking status: Never Smoker  . Smokeless tobacco: Never Used  Substance and Sexual Activity  . Alcohol use: No  . Drug use: No  . Sexual activity: Not Currently    Birth control/protection: Post-menopausal  Other Topics Concern  . Not on file  Social History Narrative   Lives alone. Parents live in [place].   Social Determinants of Health   Financial Resource Strain:   . Difficulty of Paying Living Expenses: Not on file  Food Insecurity:   . Worried About Charity fundraiser in the Last Year: Not on file  . Ran Out of Food in the Last Year: Not on file  Transportation Needs:   . Lack of Transportation (Medical): Not on file  . Lack of Transportation (Non-Medical): Not on file  Physical Activity:   . Days of Exercise per Week: Not on file  . Minutes of Exercise per Session: Not on file  Stress:   . Feeling of Stress : Not on file  Social Connections:   . Frequency of Communication with Friends and Family: Not on file  . Frequency of Social Gatherings with Friends  and Family: Not on file  . Attends Religious Services: Not on file  . Active Member of Clubs or Organizations: Not on file  . Attends Archivist Meetings: Not on file  . Marital Status: Not on file  Intimate Partner Violence:   . Fear of Current or Ex-Partner: Not on file  . Emotionally Abused: Not on file  . Physically Abused: Not on file  . Sexually Abused: Not on file     Physical Exam: BP 140/86 (BP Location: Left Arm, Patient Position: Sitting, Cuff Size: Large)   Pulse 86   Temp (!) 97.4 F (36.3 C)   Ht 5' 3.5" (1.613 m)   Wt 238 lb 8 oz (108.2 kg)   BMI 41.59 kg/m  Constitutional: generally well-appearing Psychiatric: alert and oriented x3 Abdomen: soft, nontender, nondistended, no obvious ascites, no peritoneal signs, normal bowel sounds No peripheral edema noted in lower extremities  Assessment and plan: 58 y.o. female with recurrent lower GI bleeds, likely diverticular  She understands that she has diverticulosis throughout her colon and it makes it quite difficult to know exactly where the diverticular bleeding is coming from.  In 2017 a CT angiogram suggested this was coming from her proximal descending colon.  Possibly it is continuing to come from there on a recurrent basis.    She understands that she will very possibly have further episodes of bleeding.  She knows if she does she should call here and or head straight to the emergency room.  If she does get to the emergency room I recommend urgent, emergent CT angiogram to once again try to document where this is coming from in her colon.  If it is again around the same region that I think we would have good enough evidence to support segmental or left hemicolectomy at that point.  Please see the "Patient Instructions" section for addition details about the plan.  Owens Loffler, MD [facility] 10/30/2019, 10:07 AM

## 2019-10-30 NOTE — Patient Instructions (Addendum)
If you are age 59 or older, your body mass index should be between 23-30. Your Body mass index is 41.59 kg/m. If this is out of the aforementioned range listed, please consider follow up with your Primary Care Provider.  If you are age 21 or younger, your body mass index should be between 19-25. Your Body mass index is 41.59 kg/m. If this is out of the aformentioned range listed, please consider follow up with your Primary Care Provider.   In 4 weeks, on or after 11/30/2019, please go to the lab in the basement of [facility] building to have a complete blood count done. (CBC)  Follow up as needed.  Thank you for choosing me and [facility]  Owens Loffler, MD

## 2019-12-12 ENCOUNTER — Other Ambulatory Visit: Payer: Self-pay | Admitting: Family Medicine

## 2019-12-12 DIAGNOSIS — Z1231 Encounter for screening mammogram for malignant neoplasm of breast: Secondary | ICD-10-CM

## 2020-01-19 ENCOUNTER — Other Ambulatory Visit: Payer: Self-pay

## 2020-01-19 ENCOUNTER — Ambulatory Visit
Admission: RE | Admit: 2020-01-19 | Discharge: 2020-01-19 | Disposition: A | Discharge status: Home / Self Care | Payer: 59 | Source: Ambulatory Visit | Attending: Family Medicine | Admitting: Family Medicine

## 2020-01-19 DIAGNOSIS — Z1231 Encounter for screening mammogram for malignant neoplasm of breast: Secondary | ICD-10-CM

## 2020-02-29 ENCOUNTER — Encounter (HOSPITAL_COMMUNITY): Payer: Self-pay | Admitting: Emergency Medicine

## 2020-02-29 ENCOUNTER — Other Ambulatory Visit: Payer: Self-pay

## 2020-02-29 ENCOUNTER — Emergency Department (HOSPITAL_COMMUNITY): Payer: 59

## 2020-02-29 ENCOUNTER — Inpatient Hospital Stay (HOSPITAL_COMMUNITY)
Admission: EM | Admit: 2020-02-29 | Discharge: 2020-03-03 | DRG: 379 | Disposition: A | Discharge status: Home / Self Care | Payer: 59 | Attending: Family Medicine | Admitting: Family Medicine

## 2020-02-29 DIAGNOSIS — K922 Gastrointestinal hemorrhage, unspecified: Secondary | ICD-10-CM | POA: Diagnosis present

## 2020-02-29 DIAGNOSIS — Z8639 Personal history of other endocrine, nutritional and metabolic disease: Secondary | ICD-10-CM

## 2020-02-29 DIAGNOSIS — Z8249 Family history of ischemic heart disease and other diseases of the circulatory system: Secondary | ICD-10-CM

## 2020-02-29 DIAGNOSIS — Z20822 Contact with and (suspected) exposure to covid-19: Secondary | ICD-10-CM | POA: Diagnosis present

## 2020-02-29 DIAGNOSIS — Z803 Family history of malignant neoplasm of breast: Secondary | ICD-10-CM

## 2020-02-29 DIAGNOSIS — D509 Iron deficiency anemia, unspecified: Secondary | ICD-10-CM | POA: Diagnosis present

## 2020-02-29 DIAGNOSIS — E669 Obesity, unspecified: Secondary | ICD-10-CM | POA: Diagnosis present

## 2020-02-29 DIAGNOSIS — I10 Essential (primary) hypertension: Secondary | ICD-10-CM | POA: Diagnosis present

## 2020-02-29 DIAGNOSIS — Z8041 Family history of malignant neoplasm of ovary: Secondary | ICD-10-CM

## 2020-02-29 DIAGNOSIS — Z79899 Other long term (current) drug therapy: Secondary | ICD-10-CM

## 2020-02-29 DIAGNOSIS — E119 Type 2 diabetes mellitus without complications: Secondary | ICD-10-CM | POA: Diagnosis present

## 2020-02-29 DIAGNOSIS — K921 Melena: Secondary | ICD-10-CM | POA: Diagnosis present

## 2020-02-29 DIAGNOSIS — Z794 Long term (current) use of insulin: Secondary | ICD-10-CM

## 2020-02-29 DIAGNOSIS — Z833 Family history of diabetes mellitus: Secondary | ICD-10-CM

## 2020-02-29 DIAGNOSIS — E785 Hyperlipidemia, unspecified: Secondary | ICD-10-CM | POA: Diagnosis present

## 2020-02-29 DIAGNOSIS — K5731 Diverticulosis of large intestine without perforation or abscess with bleeding: Principal | ICD-10-CM | POA: Diagnosis present

## 2020-02-29 DIAGNOSIS — Z86010 Personal history of colonic polyps: Secondary | ICD-10-CM

## 2020-02-29 DIAGNOSIS — Z886 Allergy status to analgesic agent status: Secondary | ICD-10-CM

## 2020-02-29 DIAGNOSIS — E282 Polycystic ovarian syndrome: Secondary | ICD-10-CM | POA: Diagnosis present

## 2020-02-29 DIAGNOSIS — Z888 Allergy status to other drugs, medicaments and biological substances status: Secondary | ICD-10-CM

## 2020-02-29 DIAGNOSIS — K760 Fatty (change of) liver, not elsewhere classified: Secondary | ICD-10-CM | POA: Diagnosis present

## 2020-02-29 LAB — BASIC METABOLIC PANEL
Anion gap: 10 (ref 5–15)
BUN: 12 mg/dL (ref 6–20)
CO2: 25 mmol/L (ref 22–32)
Calcium: 9.2 mg/dL (ref 8.9–10.3)
Chloride: 103 mmol/L (ref 98–111)
Creatinine, Ser: 0.88 mg/dL (ref 0.44–1.00)
GFR calc Af Amer: >60 mL/min (ref >60)
GFR calc non Af Amer: >60 mL/min (ref >60)
Glucose, Bld: 257 mg/dL — ABNORMAL HIGH (ref 70–99)
Potassium: 4.1 mmol/L (ref 3.5–5.1)
Sodium: 138 mmol/L (ref 135–145)

## 2020-02-29 LAB — HEPATIC FUNCTION PANEL
ALT: 21 U/L (ref 0–44)
AST: 26 U/L (ref 15–41)
Albumin: 4.2 g/dL (ref 3.5–5.0)
Alkaline Phosphatase: 61 U/L (ref 38–126)
Bilirubin, Direct: 0.3 mg/dL — ABNORMAL HIGH (ref 0.0–0.2)
Indirect Bilirubin: 0.6 mg/dL (ref 0.3–0.9)
Total Bilirubin: 0.9 mg/dL (ref 0.3–1.2)
Total Protein: 8.2 g/dL — ABNORMAL HIGH (ref 6.5–8.1)

## 2020-02-29 LAB — CBC WITH DIFFERENTIAL/PLATELET
Abs Immature Granulocytes: 0.06 10*3/uL (ref 0.00–0.07)
Basophils Absolute: 0.1 10*3/uL (ref 0.0–0.1)
Basophils Relative: 1 %
Eosinophils Absolute: 0.4 10*3/uL (ref 0.0–0.5)
Eosinophils Relative: 4 %
HCT: 47.8 % — ABNORMAL HIGH (ref 36.0–46.0)
Hemoglobin: 14.3 g/dL (ref 12.0–15.0)
Immature Granulocytes: 1 %
Lymphocytes Relative: 28 %
Lymphs Abs: 2.7 10*3/uL (ref 0.7–4.0)
MCH: 21.4 pg — ABNORMAL LOW (ref 26.0–34.0)
MCHC: 29.9 g/dL — ABNORMAL LOW (ref 30.0–36.0)
MCV: 71.7 fL — ABNORMAL LOW (ref 80.0–100.0)
Monocytes Absolute: 0.9 10*3/uL (ref 0.1–1.0)
Monocytes Relative: 9 %
Neutro Abs: 5.7 10*3/uL (ref 1.7–7.7)
Neutrophils Relative %: 57 %
Platelets: 310 10*3/uL (ref 150–400)
RBC: 6.67 MIL/uL — ABNORMAL HIGH (ref 3.87–5.11)
RDW: 18.9 % — ABNORMAL HIGH (ref 11.5–15.5)
WBC: 9.8 10*3/uL (ref 4.0–10.5)
nRBC: 0 % (ref 0.0–0.2)

## 2020-02-29 LAB — TYPE AND SCREEN
ABO/RH(D): O POS
Antibody Screen: NEGATIVE

## 2020-02-29 LAB — RESPIRATORY PANEL BY RT PCR (FLU A&B, COVID)
Influenza A by PCR: NEGATIVE
Influenza B by PCR: NEGATIVE
SARS Coronavirus 2 by RT PCR: NEGATIVE

## 2020-02-29 LAB — PROTIME-INR
INR: 0.9 (ref 0.8–1.2)
Prothrombin Time: 11.9 seconds (ref 11.4–15.2)

## 2020-02-29 LAB — GLUCOSE, CAPILLARY
Glucose-Capillary: 94 mg/dL (ref 70–99)
Glucose-Capillary: 100 mg/dL — ABNORMAL HIGH (ref 70–99)

## 2020-02-29 LAB — POC OCCULT BLOOD, ED: Fecal Occult Bld: POSITIVE — AB

## 2020-02-29 LAB — HEMOGLOBIN AND HEMATOCRIT, BLOOD
HCT: 42.9 % (ref 36.0–46.0)
Hemoglobin: 12.9 g/dL (ref 12.0–15.0)

## 2020-02-29 LAB — HEMOGLOBIN A1C
Hgb A1c MFr Bld: 9.3 % — ABNORMAL HIGH (ref 4.8–5.6)
Mean Plasma Glucose: 220.21 mg/dL

## 2020-02-29 MED ORDER — INSULIN ASPART 100 UNIT/ML ~~LOC~~ SOLN
0.0000 [IU] | SUBCUTANEOUS | Status: DC
  Administered 2020-03-01: 18:00:00 5 [IU] via SUBCUTANEOUS
  Administered 2020-03-01: 09:00:00 3 [IU] via SUBCUTANEOUS
  Administered 2020-03-01 (×3): 2 [IU] via SUBCUTANEOUS
  Administered 2020-03-01: 5 [IU] via SUBCUTANEOUS
  Administered 2020-03-02: 13:00:00 3 [IU] via SUBCUTANEOUS
  Administered 2020-03-02: 8 [IU] via SUBCUTANEOUS
  Administered 2020-03-02: 08:00:00 3 [IU] via SUBCUTANEOUS
  Administered 2020-03-02: 04:00:00 2 [IU] via SUBCUTANEOUS
  Administered 2020-03-03: 3 [IU] via SUBCUTANEOUS
  Administered 2020-03-03: 09:00:00 2 [IU] via SUBCUTANEOUS
  Administered 2020-03-03: 05:00:00 3 [IU] via SUBCUTANEOUS
  Filled 2020-02-29: qty 0.15

## 2020-02-29 MED ORDER — IOHEXOL 300 MG/ML  SOLN
100.0000 mL | Freq: Once | INTRAMUSCULAR | Status: AC | PRN
  Administered 2020-02-29: 12:00:00 100 mL via INTRAVENOUS

## 2020-02-29 MED ORDER — BASAGLAR KWIKPEN 100 UNIT/ML ~~LOC~~ SOPN: 15.0000 [IU] | PEN_INJECTOR | Freq: Every day | SUBCUTANEOUS | Status: DC

## 2020-02-29 MED ORDER — SODIUM CHLORIDE (PF) 0.9 % IJ SOLN
INTRAMUSCULAR | Status: AC
  Filled 2020-02-29: qty 50

## 2020-02-29 MED ORDER — CARVEDILOL 3.125 MG PO TABS
3.1250 mg | ORAL_TABLET | Freq: Two times a day (BID) | ORAL | Status: DC
  Administered 2020-02-29 – 2020-03-03 (×6): 3.125 mg via ORAL
  Filled 2020-02-29 (×7): qty 1

## 2020-02-29 MED ORDER — ONDANSETRON HCL 4 MG/2ML IJ SOLN: 4.0000 mg | Freq: Four times a day (QID) | INTRAMUSCULAR | Status: DC | PRN

## 2020-02-29 MED ORDER — DICYCLOMINE HCL 20 MG PO TABS
20.0000 mg | ORAL_TABLET | Freq: Three times a day (TID) | ORAL | Status: DC | PRN
  Administered 2020-02-29 – 2020-03-02 (×2): 20 mg via ORAL
  Filled 2020-02-29 (×2): qty 1

## 2020-02-29 MED ORDER — ONDANSETRON HCL 4 MG PO TABS: 4.0000 mg | ORAL_TABLET | Freq: Four times a day (QID) | ORAL | Status: DC | PRN

## 2020-02-29 MED ORDER — IRBESARTAN 150 MG PO TABS
300.0000 mg | ORAL_TABLET | Freq: Every day | ORAL | Status: DC
  Administered 2020-02-29 – 2020-03-03 (×4): 300 mg via ORAL
  Filled 2020-02-29: qty 1
  Filled 2020-02-29 (×4): qty 2

## 2020-02-29 MED ORDER — ACETAMINOPHEN 325 MG PO TABS
650.0000 mg | ORAL_TABLET | Freq: Four times a day (QID) | ORAL | Status: DC | PRN
  Administered 2020-03-02: 04:00:00 650 mg via ORAL
  Filled 2020-02-29: qty 2

## 2020-02-29 MED ORDER — INSULIN GLARGINE 100 UNIT/ML ~~LOC~~ SOLN: 8.0000 [IU] | Freq: Every day | SUBCUTANEOUS | Status: DC

## 2020-02-29 MED ORDER — INSULIN GLARGINE 100 UNIT/ML ~~LOC~~ SOLN
15.0000 [IU] | Freq: Every evening | SUBCUTANEOUS | Status: DC
  Administered 2020-03-01 – 2020-03-02 (×2): 15 [IU] via SUBCUTANEOUS
  Filled 2020-02-29 (×3): qty 0.15

## 2020-02-29 MED ORDER — ACETAMINOPHEN 650 MG RE SUPP: 650.0000 mg | Freq: Four times a day (QID) | RECTAL | Status: DC | PRN

## 2020-02-29 NOTE — H&P (Signed)
History and Physical  Carol Mueller [id number] DOB: Mar 04, 1961 DOA: 02/29/2020   PCP: [name], DO   Patient coming from: Home   Chief Complaint: Bright red blood per rectum   HPI: Carol Mueller is a 59 y.o. female with medical history significant for [place] Seamen type 2 diabetes as well as known diverticulosis, hypertension, hyperlipidemia, polycystic ovarian disease and Cushing's syndrome due to adrenal tumor be admitted to the hospital with recurrent hematochezia.  The patient tells me that she was in her usual state of health until a couple of days ago when she had some mild cramping abdominal pain, this morning she had bright red bowel movement without significant pain.  She came to the emergency department for evaluation, where lab work revealed normal hemoglobin of 14, normal hemodynamics.  Given her prior history of diverticular bleeding, she had CT scan of the abdomen and pelvis without any remarkable findings.  Case was also discussed with low-power gastroenterology who recommended observation admission.  Currently, the patient is quite comfortable, though she says that her crampy abdominal pain is returning.  She denies the use of any blood thinners, any nausea, vomiting, fevers, chills, chest pain or any other symptoms on review of systems other than that mentioned above.  Review of Systems: Please see HPI for pertinent positives and negatives. A complete 10 system review of systems are otherwise negative.  Past Medical History:  Diagnosis Date  . Anemia   . 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 Diabetes mellitus without complication (HCC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  . Diverticulitis   . Diverticulosis   . GI bleed   . Hyperlipidemia   . Hypertension   . Polycystic ovary disease   . Sepsis ([place]) 05/2019   Past Surgical History:  Procedure Laterality Date  . ADRENALECTOMY  1996  . BREAST BIOPSY    . CHOLECYSTECTOMY    . COLONOSCOPY WITH PROPOFOL N/A  10/03/2019   Procedure: COLONOSCOPY WITH PROPOFOL;  Surgeon: Ladene Artist, MD;  [facility];  Service: Endoscopy;  Laterality: N/A;  . ESOPHAGOGASTRODUODENOSCOPY (EGD) WITH PROPOFOL N/A 07/16/2015   Procedure: ESOPHAGOGASTRODUODENOSCOPY (EGD) WITH PROPOFOL;  Surgeon: [name], MD;  Location: [facility];  Service: Endoscopy;  Laterality: N/A;  . POLYPECTOMY  10/03/2019   Procedure: POLYPECTOMY;  Surgeon: Ladene Artist, MD;  Location: [facility];  Service: Endoscopy;;    Social History:  reports that she has never smoked. She has never used smokeless tobacco. She reports that she does not drink alcohol or use drugs.   Allergies  Allergen Reactions  . Norvasc [Amlodipine Besylate] Swelling    ANGIOEDEMA  . Nsaids Other (See Comments)    GI upset/ GI bleed    Family History  Problem Relation Age of Onset  . Hypertension Mother   . Cancer Mother        ?uterine or ovarian cancer  . Hypertension Father   . Diabetes Father   . Hypertension Sister   . Diabetes Sister   . Hypertension Brother   . Diabetes Brother   . Hypertension Maternal Grandfather   . Diabetes Paternal Grandmother   . Breast cancer Maternal Grandmother   . Colon cancer Neg Hx      Prior to Admission medications   Medication Sig Start Date End Date Taking? Authorizing Provider  acetaminophen (TYLENOL) 325 MG tablet Take 2 tablets (650 mg total) by mouth every 6 (six) hours as needed for mild pain (or  Fever >/= 101). 09/19/19  Yes [name] I, MD  atorvastatin (LIPITOR) 10 MG tablet Take 10 mg by mouth daily. 03/06/19  Yes [provider]  carvedilol (COREG) 3.125 MG tablet Take 3.125 mg by mouth 2 (two) times daily with a meal.   Yes [provider]  dicyclomine (BENTYL) 20 MG tablet TAKE 1 TABLET BY MOUTH TWICE A DAY Patient taking differently: Take 20 mg by mouth as needed (Stomach pain).  06/02/17  Yes [name], MD  empagliflozin (JARDIANCE) 10 MG TABS  tablet Take 10 mg by mouth daily.   Yes [provider]  glimepiride (AMARYL) 2 MG tablet Take 2 mg by mouth daily. 02/23/20  Yes [provider]  Insulin Glargine (BASAGLAR KWIKPEN) 100 UNIT/ML Inject 30 Units into the skin daily. 02/20/20  Yes [provider]  metFORMIN (GLUCOPHAGE) 1000 MG tablet Take 500 mg by mouth 2 (two) times daily.  01/11/20  Yes [provider]  Multiple Vitamins-Minerals (MULTIVITAMIN WITH MINERALS) tablet Take 1 tablet by mouth daily.   Yes [provider]  olmesartan (BENICAR) 40 MG tablet Take 40 mg by mouth daily. 10/11/19  Yes [provider]  insulin glargine (LANTUS) 100 UNIT/ML injection Inject 0.15 mLs (15 Units total) into the skin daily at 10 pm. Patient not taking: Reported on 02/29/2020 09/19/19   Bonnell Public, MD    Physical Exam: BP (!) 158/91   Pulse 89   Temp 99 F (37.2 C) (Oral)   Resp (!) 23   SpO2 100%   General:  Alert, oriented, calm, in no acute distress  Eyes: EOMI, clear conjuctivae, white sclerea Neck: supple, no masses, trachea mildline  Cardiovascular: RRR, no murmurs or rubs, no peripheral edema  Respiratory: clear to auscultation bilaterally, no wheezes, no crackles  Abdomen: soft, nontender, nondistended, normal bowel tones heard  Skin: dry, no rashes  Musculoskeletal: no joint effusions, normal range of motion  Psychiatric: appropriate affect, normal speech  Neurologic: extraocular muscles intact, clear speech, moving all extremities with intact sensorium           Labs on Admission:  Basic Metabolic Panel: Recent Labs  Lab 02/29/20 1121  NA 138  K 4.1  CL 103  CO2 25  GLUCOSE 257*  BUN 12  CREATININE 0.88  CALCIUM 9.2   Liver Function Tests: Recent Labs  Lab 02/29/20 1121  AST 26  ALT 21  ALKPHOS 61  BILITOT 0.9  PROT 8.2*  ALBUMIN 4.2   No results for input(s): LIPASE, AMYLASE in the last 168 hours. No results for input(s): AMMONIA in the last  168 hours. CBC: Recent Labs  Lab 02/29/20 1121  WBC 9.8  NEUTROABS 5.7  HGB 14.3  HCT 47.8*  MCV 71.7*  PLT 310   Cardiac Enzymes: No results for input(s): CKTOTAL, CKMB, CKMBINDEX, TROPONINI in the last 168 hours.  BNP (last 3 results) No results for input(s): BNP in the last 8760 hours.  ProBNP (last 3 results) No results for input(s): PROBNP in the last 8760 hours.  CBG: No results for input(s): GLUCAP in the last 168 hours.  Radiological Exams on Admission: CT ABDOMEN PELVIS W CONTRAST  Result Date: 02/29/2020 CLINICAL DATA:  Pain with blood in stool EXAM: CT ABDOMEN AND PELVIS WITH CONTRAST TECHNIQUE: Multidetector CT imaging of the abdomen and pelvis was performed using the standard protocol following bolus administration of intravenous contrast. CONTRAST:  1103mL OMNIPAQUE IOHEXOL 300 MG/ML  SOLN COMPARISON:  September 17, 2019 FINDINGS:  Lower chest: There is mild bibasilar atelectatic change. Lung bases otherwise are clear. Hepatobiliary: There is hepatic steatosis. No focal liver lesions are evident. Gallbladder is absent. There is no appreciable biliary duct dilatation. Pancreas: There is no pancreatic mass or inflammatory focus. Spleen: No splenic lesions are evident. Adrenals/Urinary Tract: Right adrenal absent, a finding also present previously. There are clips in the postoperative region from previous right adrenalectomy. Left adrenal appears unremarkable. There is a cyst in the upper to mid right kidney measuring 2.8 x 2.6 cm. There is a cyst in the mid right kidney measuring 1.1 x 1.0 cm. There is a cyst in the lower pole left kidney measuring 2.8 x 2.6 cm. There are subcentimeter cysts scattered throughout the left kidney. There is no evident hydronephrosis on either side. There is no evident renal or ureteral calculus on either side. Urinary bladder is midline with wall thickness within normal limits. Stomach/Bowel: There are multiple descending colonic and sigmoid  diverticula. There also multiple ascending colonic diverticula with occasional transverse colon diverticula. No areas of bowel wall thickening or soft tissue stranding to suggest diverticulitis evident. There is no evident bowel obstruction. The terminal ileum appears normal. There is no evident free air or portal venous air. Vascular/Lymphatic: No abdominal aortic aneurysm. There is minimal aortic atherosclerotic plaque. Major venous structures appear patent. No evident adenopathy in the abdomen or pelvis. Reproductive: Uterus is diffusely enlarged with multiple masses throughout the uterus, several of which show calcification consistent with diffuse leiomyomatous change. This finding is similar in appearance to prior study. No extrauterine pelvic mass is evident. Other: Appendix appears normal. No abscess or ascites is evident in the abdomen or pelvis. There is a small umbilical hernia containing fat but no bowel. There is fat in the right inguinal ring. Musculoskeletal: There are foci of degenerative change in the lumbar and lower thoracic spine regions. No blastic or lytic bone lesions. No evident intramuscular lesions. IMPRESSION: 1. Extensive and fairly diffuse colonic diverticulosis without diverticulitis. No bowel wall thickening or bowel obstruction. With respect to blood in stool, it may be prudent to consider direct colonic visualization after appropriate colonic cleansing. 2.  No abscess in the abdomen or pelvis.  Appendix appears normal. 3.  Hepatic steatosis.  No focal liver lesions.  Gallbladder absent. 4. No renal or ureteral calculus. No hydronephrosis. Urinary bladder wall thickness normal. 5.  Enlarged leiomyomatous uterus, similar to prior study. 6. Small umbilical and right inguinal hernias containing only fat, stable from prior study. 7.  Right adrenal absent. Electronically Signed   By: Lowella Grip III M.D.   On: 02/29/2020 12:52   Assessment/Plan Present on Admission: .  Hematochezia  Active Problems:   Hematochezia -Observation admission -Avoid blood thinners -Trend hemoglobin -Clear liquid diet -GI consulted by ER  Type 2 DM - insulin dependant, she takes Engineer, agricultural at home and not Lantus -Diabetic diet when eating -We will place on half of her home dose of Basaglar tonight in case she is n.p.o. for any GI procedures -We will add on moderate dose sliding scale insulin in the meantime  Hypertension-continue Coreg and irbesartan  DVT prophylaxis: SCDs   Code Status: FULL   Family Communication: None   Disposition Plan: Home when stable.   Consults called: [facility]   Admission status: Observation   Time spent: 42 minutes  Olive Motyka Marry Guan MD [facility] Pager [phone]  If 7PM-7AM, please contact night-coverage [email or web address] Password [email or web address]  02/29/2020, 1:53 PM

## 2020-02-29 NOTE — ED Notes (Signed)
Patient transported to CT 

## 2020-02-29 NOTE — ED Notes (Signed)
Called floor who reports nurse is off the unit at this time.

## 2020-02-29 NOTE — Consult Note (Signed)
Referring Provider:  [facility]         Primary Care Physician:  [name], DO Primary Gastroenterologist:   Ranelle Oyster, MD           We were asked to see this patient for:    Hematochezia              ASSESSMENT /  PLAN   59 year old female with PMH significant for diverticular bleeding, diabetes, hypertension, hyperlipidemia, polycystic ovary disease, Cushing syndrome, cholecystectomy, adrenalectomy  #Recurrent hematochezia, overall painless. --Suspect another diverticular hemorrhage --CT scan with contrast unrevealing except for known pandiverticulosis --It is in her history that patient underwent IR embolization during one of her diverticular bleeds.  After speaking with the patient and reviewing 2017 records it appears this did not come to fruition.  There was no active bleeding at the time of IR evaluation --Continue supportive care for now.  Monitor hemoglobin, transfuse if needed --If continues to bleed then may need CTA / IR evaluation.  Unfortunately patient have pandiverticulosis limiting surgical options --Clear liquids okay   HPI:    Chief Complaint: rectal bleeding  Carol Mueller is a 59 y.o. female with a history of recurrent diverticular bleeding.  It is in her history that patient underwent IR embolization during one of the bleeds.  However patient says this actually did not come to fruition.  I reviewed IR notes from 2017 and it appears that she did stop bleeding while in IR department  Her last episode of diverticular bleeding was November 2020 when she was admitted twice for the bleeding..  Colonoscopy during that admission remarkable for right and left-sided diverticulosis.  Internal hemorrhoids.   Two  6 8 mm tubular adenomatous polyps were removed.  She required a unit of blood that admission.  Hemoglobin 10/09/19 was 10  Patient presented to the ED this a.m. with recurrent hematochezia and crampy abdominal pain.  Presenting hemoglobin 14.3. MCV  71.  CT scan of the abdomen pelvis with contrast pan diverticulosis, hepatic steatosis, no acute findings. She is hemodynamically stable.  Adrianna had some left mid to lower abdominal pain a few days last week for which she took Tylenol.  No abdominal pain since but this morning around 9 AM she began having episodes of hematochezia.  She had 3 or 4 episodes at home and since arriving to the ED has had a couple more.  Patient says it is not unusual for her to have abdominal discomfort prior to onset of bleeding.  She feels this presentation is similar to previous presentations of diverticular bleeding.  She does not take any NSAIDs, no blood thinners.  She has no other complaints, does endorse recent polyuria  Past Medical History:  Diagnosis Date  . Anemia   . Cushing's syndrome ([place])    due to adrenal tumor  . Diabetes mellitus without complication (HCC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  . Diverticulitis   . Diverticulosis   . GI bleed   . Hyperlipidemia   . Hypertension   . Polycystic ovary disease   . Sepsis ([place]) 05/2019    Past Surgical History:  Procedure Laterality Date  . ADRENALECTOMY  1996  . BREAST BIOPSY    . CHOLECYSTECTOMY    . COLONOSCOPY WITH PROPOFOL N/A 10/03/2019   Procedure: COLONOSCOPY WITH PROPOFOL;  Surgeon: Ladene Artist, MD;  Location: [facility];  Service: Endoscopy;  Laterality: N/A;  . ESOPHAGOGASTRODUODENOSCOPY (EGD) WITH PROPOFOL N/A 07/16/2015   Procedure: ESOPHAGOGASTRODUODENOSCOPY (EGD) WITH PROPOFOL;  Surgeon: [name], MD;  Location: [facility];  Service: Endoscopy;  Laterality: N/A;  . POLYPECTOMY  10/03/2019   Procedure: POLYPECTOMY;  Surgeon: Ladene Artist, MD;  Location: [facility];  Service: Endoscopy;;    Prior to Admission medications   Medication Sig Start Date End Date Taking? Authorizing Provider  acetaminophen (TYLENOL) 325 MG tablet Take 2 tablets (650 mg total) by mouth every 6 (six) hours as needed for mild pain (or Fever >/=  101). 09/19/19  Yes [name] I, MD  atorvastatin (LIPITOR) 10 MG tablet Take 10 mg by mouth daily. 03/06/19  Yes [provider]  carvedilol (COREG) 3.125 MG tablet Take 3.125 mg by mouth 2 (two) times daily with a meal.   Yes [provider]  dicyclomine (BENTYL) 20 MG tablet TAKE 1 TABLET BY MOUTH TWICE A DAY Patient taking differently: Take 20 mg by mouth as needed (Stomach pain).  06/02/17  Yes [name], MD  empagliflozin (JARDIANCE) 10 MG TABS tablet Take 10 mg by mouth daily.   Yes [provider]  glimepiride (AMARYL) 2 MG tablet Take 2 mg by mouth daily. 02/23/20  Yes [provider]  Insulin Glargine (BASAGLAR KWIKPEN) 100 UNIT/ML Inject 30 Units into the skin daily. 02/20/20  Yes [provider]  metFORMIN (GLUCOPHAGE) 1000 MG tablet Take 500 mg by mouth 2 (two) times daily.  01/11/20  Yes [provider]  Multiple Vitamins-Minerals (MULTIVITAMIN WITH MINERALS) tablet Take 1 tablet by mouth daily.   Yes [provider]  olmesartan (BENICAR) 40 MG tablet Take 40 mg by mouth daily. 10/11/19  Yes [provider]  insulin glargine (LANTUS) 100 UNIT/ML injection Inject 0.15 mLs (15 Units total) into the skin daily at 10 pm. Patient not taking: Reported on 02/29/2020 09/19/19   Bonnell Public, MD    Current Facility-Administered Medications  Medication Dose Route Frequency Provider Last Rate Last Admin  . acetaminophen (TYLENOL) tablet 650 mg  650 mg Oral Q6H PRN Hollice Gong, Mir Earlie Server, MD       Or  . acetaminophen (TYLENOL) suppository 650 mg  650 mg Rectal Q6H PRN Hollice Gong, Mir Mohammed, MD      . carvedilol (COREG) tablet 3.125 mg  3.125 mg Oral BID WC Ikramullah, Mir Mohammed, MD      . insulin aspart (novoLOG) injection 0-15 Units  0-15 Units Subcutaneous Q4H Ikramullah, Mir Mohammed, MD      . insulin glargine (LANTUS) injection 15 Units  15 Units Subcutaneous QPM Ikramullah, Mir Mohammed, MD        . irbesartan (AVAPRO) tablet 300 mg  300 mg Oral Daily Ikramullah, Mir Mohammed, MD      . ondansetron [facility]) tablet 4 mg  4 mg Oral Q6H PRN Hollice Gong, Mir Earlie Server, MD       Or  . ondansetron [facility]) injection 4 mg  4 mg Intravenous Q6H PRN Hollice Gong, Mir Mohammed, MD      . sodium chloride (PF) 0.9 % injection             Allergies as of 02/29/2020 - Review Complete 02/29/2020  Allergen Reaction Noted  . Norvasc [amlodipine besylate] Swelling 02/22/2012  . Nsaids Other (See Comments) 08/07/2015    Family History  Problem Relation Age of Onset  . Hypertension Mother   . Cancer Mother        ?uterine or ovarian cancer  . Hypertension Father   . Diabetes Father   . Hypertension Sister   .  Diabetes Sister   . Hypertension Brother   . Diabetes Brother   . Hypertension Maternal Grandfather   . Diabetes Paternal Grandmother   . Breast cancer Maternal Grandmother   . Colon cancer Neg Hx     Social History   Socioeconomic History  . Marital status: Single    Spouse name: N/A  . Number of children: 0  . Years of education: Not on file  . Highest education level: Not on file  Occupational History  . Occupation: NURSE    Comment: [employer]  Tobacco Use  . Smoking status: Never Smoker  . Smokeless tobacco: Never Used  Substance and Sexual Activity  . Alcohol use: No  . Drug use: No  . Sexual activity: Not Currently    Birth control/protection: Post-menopausal  Other Topics Concern  . Not on file  Social History Narrative   Lives alone. Parents live in [place].   Social Determinants of Health   Financial Resource Strain:   . Difficulty of Paying Living Expenses:   Food Insecurity:   . Worried About Charity fundraiser in the Last Year:   . Arboriculturist in the Last Year:   Transportation Needs:   . Film/video editor (Medical):   Marland Kitchen Lack of Transportation (Non-Medical):   Physical Activity:   . Days of Exercise per Week:   . Minutes of Exercise per  Session:   Stress:   . Feeling of Stress :   Social Connections:   . Frequency of Communication with Friends and Family:   . Frequency of Social Gatherings with Friends and Family:   . Attends Religious Services:   . Active Member of Clubs or Organizations:   . Attends Archivist Meetings:   Marland Kitchen Marital Status:   Intimate Partner Violence:   . Fear of Current or Ex-Partner:   . Emotionally Abused:   Marland Kitchen Physically Abused:   . Sexually Abused:     Review of Systems: All systems reviewed and negative except where noted in HPI.  Physical Exam: Vital signs in last 24 hours: Temp:  [97.9 F (36.6 C)-99 F (37.2 C)] 97.9 F (36.6 C) (04/15 1511) Pulse Rate:  [87-96] 87 (04/15 1511) Resp:  [17-25] 19 (04/15 1511) BP: (144-169)/(77-101) 154/77 (04/15 1511) SpO2:  [94 %-100 %] 96 % (04/15 1511)   General:   Alert,female in NAD Psych:  Pleasant, cooperative. Normal mood and affect. Eyes:  Pupils equal, sclera clear, no icterus.   Conjunctiva pink. Ears:  Normal auditory acuity. Nose:  No deformity, discharge,  or lesions. Neck:  Supple; no masses Lungs:  Clear throughout to auscultation.   No wheezes, crackles, or rhonchi.  Heart:  Regular rate and rhythm; no murmurs, no lower extremity edema Abdomen:  Soft, non-distended, nontender, BS active, no palp mass   Rectal:  Deferred  Msk:  Symmetrical without gross deformities. . Neurologic:  Alert and  oriented x4;  grossly normal neurologically. Skin:  Intact without significant lesions or rashes.   Intake/Output from previous day: No intake/output data recorded. Intake/Output this shift: No intake/output data recorded.  Lab Results: Recent Labs    02/29/20 1121  WBC 9.8  HGB 14.3  HCT 47.8*  PLT 310   BMET Recent Labs    02/29/20 1121  NA 138  K 4.1  CL 103  CO2 25  GLUCOSE 257*  BUN 12  CREATININE 0.88  CALCIUM 9.2   LFT Recent Labs    02/29/20 1121  PROT  8.2*  ALBUMIN 4.2  AST 26  ALT 21    ALKPHOS 61  BILITOT 0.9  BILIDIR 0.3*  IBILI 0.6   PT/INR Recent Labs    02/29/20 1121  LABPROT 11.9  INR 0.9   Hepatitis Panel No results for input(s): HEPBSAG, HCVAB, HEPAIGM, HEPBIGM in the last 72 hours.   . CBC Latest Ref Rng & Units 02/29/2020 10/09/2019 10/04/2019  WBC 4.0 - 10.5 K/uL 9.8 8.6 9.0  Hemoglobin 12.0 - 15.0 g/dL 14.3 10.0(L) 8.2(L)  Hematocrit 36.0 - 46.0 % 47.8(H) 32.1(L) 28.0(L)  Platelets 150 - 400 K/uL 310 420.0(H) 304    . CMP Latest Ref Rng & Units 02/29/2020 10/04/2019 10/03/2019  Glucose 70 - 99 mg/dL 257(H) 141(H) 122(H)  BUN 6 - 20 mg/dL 12 <5(L) 6  Creatinine 0.44 - 1.00 mg/dL 0.88 0.62 0.72  Sodium 135 - 145 mmol/L 138 140 142  Potassium 3.5 - 5.1 mmol/L 4.1 3.5 3.1(L)  Chloride 98 - 111 mmol/L 103 112(H) 113(H)  CO2 22 - 32 mmol/L 25 22 22   Calcium 8.9 - 10.3 mg/dL 9.2 8.3(L) 8.0(L)  Total Protein 6.5 - 8.1 g/dL 8.2(H) - -  Total Bilirubin 0.3 - 1.2 mg/dL 0.9 - -  Alkaline Phos 38 - 126 U/L 61 - -  AST 15 - 41 U/L 26 - -  ALT 0 - 44 U/L 21 - -   Studies/Results: CT ABDOMEN PELVIS W CONTRAST  Result Date: 02/29/2020 CLINICAL DATA:  Pain with blood in stool EXAM: CT ABDOMEN AND PELVIS WITH CONTRAST TECHNIQUE: Multidetector CT imaging of the abdomen and pelvis was performed using the standard protocol following bolus administration of intravenous contrast. CONTRAST:  153mL OMNIPAQUE IOHEXOL 300 MG/ML  SOLN COMPARISON:  September 17, 2019 FINDINGS: Lower chest: There is mild bibasilar atelectatic change. Lung bases otherwise are clear. Hepatobiliary: There is hepatic steatosis. No focal liver lesions are evident. Gallbladder is absent. There is no appreciable biliary duct dilatation. Pancreas: There is no pancreatic mass or inflammatory focus. Spleen: No splenic lesions are evident. Adrenals/Urinary Tract: Right adrenal absent, a finding also present previously. There are clips in the postoperative region from previous right adrenalectomy. Left  adrenal appears unremarkable. There is a cyst in the upper to mid right kidney measuring 2.8 x 2.6 cm. There is a cyst in the mid right kidney measuring 1.1 x 1.0 cm. There is a cyst in the lower pole left kidney measuring 2.8 x 2.6 cm. There are subcentimeter cysts scattered throughout the left kidney. There is no evident hydronephrosis on either side. There is no evident renal or ureteral calculus on either side. Urinary bladder is midline with wall thickness within normal limits. Stomach/Bowel: There are multiple descending colonic and sigmoid diverticula. There also multiple ascending colonic diverticula with occasional transverse colon diverticula. No areas of bowel wall thickening or soft tissue stranding to suggest diverticulitis evident. There is no evident bowel obstruction. The terminal ileum appears normal. There is no evident free air or portal venous air. Vascular/Lymphatic: No abdominal aortic aneurysm. There is minimal aortic atherosclerotic plaque. Major venous structures appear patent. No evident adenopathy in the abdomen or pelvis. Reproductive: Uterus is diffusely enlarged with multiple masses throughout the uterus, several of which show calcification consistent with diffuse leiomyomatous change. This finding is similar in appearance to prior study. No extrauterine pelvic mass is evident. Other: Appendix appears normal. No abscess or ascites is evident in the abdomen or pelvis. There is a small umbilical hernia containing fat but no bowel.  There is fat in the right inguinal ring. Musculoskeletal: There are foci of degenerative change in the lumbar and lower thoracic spine regions. No blastic or lytic bone lesions. No evident intramuscular lesions. IMPRESSION: 1. Extensive and fairly diffuse colonic diverticulosis without diverticulitis. No bowel wall thickening or bowel obstruction. With respect to blood in stool, it may be prudent to consider direct colonic visualization after appropriate colonic  cleansing. 2.  No abscess in the abdomen or pelvis.  Appendix appears normal. 3.  Hepatic steatosis.  No focal liver lesions.  Gallbladder absent. 4. No renal or ureteral calculus. No hydronephrosis. Urinary bladder wall thickness normal. 5.  Enlarged leiomyomatous uterus, similar to prior study. 6. Small umbilical and right inguinal hernias containing only fat, stable from prior study. 7.  Right adrenal absent. Electronically Signed   By: Lowella Grip III M.D.   On: 02/29/2020 12:52    Active Problems:   Hematochezia    Tye Savoy, NP-C @  02/29/2020, 3:21 PM

## 2020-02-29 NOTE — ED Notes (Signed)
Patient ambulatory to restroom with standby assistance.

## 2020-02-29 NOTE — ED Provider Notes (Signed)
[facility] DEPT Provider Note   CSN: [id number] Arrival date & time: 02/29/20  1056     History Chief Complaint  Patient presents with  . Rectal Bleeding    Carol Mueller is a 59 y.o. female.  The history is provided by the patient.  Rectal Bleeding Quality:  Bright red Amount:  Moderate Chronicity:  Recurrent Context: spontaneously   Context: not hemorrhoids   Similar prior episodes: yes   Relieved by:  Nothing Worsened by:  Nothing Associated symptoms: abdominal pain (cramps)   Associated symptoms: no fever and no vomiting   Risk factors: no anticoagulant use and no hx of colorectal surgery        Past Medical History:  Diagnosis Date  . Anemia   . 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 Diabetes mellitus without complication (HCC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  . Diverticulitis   . Diverticulosis   . GI bleed   . Hyperlipidemia   . Hypertension   . Polycystic ovary disease   . Sepsis ([place]) 05/2019    Patient Active Problem List   Diagnosis Date Noted  . Hematochezia 10/03/2019  . Benign neoplasm of transverse colon   . Benign neoplasm of descending colon   . GI bleed 10/02/2019  . Rectal bleeding 09/18/2019  . Fever   . Salmonella enteritis 05/28/2019  . 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 Sepsis ([place]) 05/24/2019  . Hypokalemia 06/02/2018  . Lower GI bleeding 06/02/2018  . Insulin-requiring or dependent type II diabetes mellitus ([place])   . Back pain with left-sided radiculopathy 09/29/2017  . Diarrhea 12/16/2016  . Lower GI bleed 11/30/2016  . History of GI diverticular bleed   . Diverticulosis of colon with hemorrhage 06/05/2016  . Ischemic colitis ([place]) 08/07/2015  . Abdominal pain   . Tachycardia 07/15/2015  . Polycystic disease, ovaries 09/12/2012  . Class 3 obesity due to excess calories with body mass index (BMI) of 40.0 to 44.9 in adult 09/12/2012  . Essential hypertension 09/12/2012  .  Hyperlipidemia with target LDL less than 100 09/12/2012  . 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 ([place]) 09/12/2012  . Allergic rhinitis 04/06/2012    Past Surgical History:  Procedure Laterality Date  . ADRENALECTOMY  1996  . BREAST BIOPSY    . CHOLECYSTECTOMY    . COLONOSCOPY WITH PROPOFOL N/A 10/03/2019   Procedure: COLONOSCOPY WITH PROPOFOL;  Surgeon: Ladene Artist, MD;  Location: [facility]COPY;  Service: Endoscopy;  Laterality: N/A;  . ESOPHAGOGASTRODUODENOSCOPY (EGD) WITH PROPOFOL N/A 07/16/2015   Procedure: ESOPHAGOGASTRODUODENOSCOPY (EGD) WITH PROPOFOL;  Surgeon: [name], MD;  Location: [facility];  Service: Endoscopy;  Laterality: N/A;  . POLYPECTOMY  10/03/2019   Procedure: POLYPECTOMY;  Surgeon: Ladene Artist, MD;  Location: [facility];  Service: Endoscopy;;     OB History   No obstetric history on file.     Family History  Problem Relation Age of Onset  . Hypertension Mother   . Cancer Mother        ?uterine or ovarian cancer  . Hypertension Father   . Diabetes Father   . Hypertension Sister   . Diabetes Sister   . Hypertension Brother   . Diabetes Brother   . Hypertension Maternal Grandfather   . Diabetes Paternal Grandmother   . Breast cancer Maternal Grandmother   . Colon cancer Neg Hx     Social History   Tobacco Use  . Smoking status:  Never Smoker  . Smokeless tobacco: Never Used  Substance Use Topics  . Alcohol use: No  . Drug use: No    Home Medications Prior to Admission medications   Medication Sig Start Date End Date Taking? Authorizing Provider  acetaminophen (TYLENOL) 325 MG tablet Take 2 tablets (650 mg total) by mouth every 6 (six) hours as needed for mild pain (or Fever >/= 101). 09/19/19   [name] I, MD  atorvastatin (LIPITOR) 10 MG tablet Take 10 mg by mouth daily. 03/06/19   [provider]  carvedilol (COREG) 3.125 MG tablet Take 3.125 mg by mouth 2 (two) times daily with a meal.    [provider]  cyclobenzaprine (FLEXERIL) 10 MG tablet Take 10 mg by mouth 3 (three) times daily as needed for muscle spasms. 09/14/19   [provider]  dicyclomine (BENTYL) 20 MG tablet TAKE 1 TABLET BY MOUTH TWICE A DAY Patient taking differently: Take 20 mg by mouth as needed (Stomach pain).  06/02/17   [name], MD  empagliflozin (JARDIANCE) 10 MG TABS tablet Take 10 mg by mouth daily.    [provider]  glimepiride (AMARYL) 2 MG tablet Take 2 mg by mouth daily. 02/23/20   [provider]  Insulin Glargine (BASAGLAR KWIKPEN) 100 UNIT/ML Inject 30 Units into the skin daily. 02/20/20   [provider]  insulin glargine (LANTUS) 100 UNIT/ML injection Inject 0.15 mLs (15 Units total) into the skin daily at 10 pm. Patient taking differently: Inject 30 Units into the skin daily at 10 pm.  09/19/19   Bonnell Public, MD  metFORMIN (GLUCOPHAGE) 1000 MG tablet Take 1,000 mg by mouth 2 (two) times daily. 01/11/20   [provider]  metFORMIN (GLUCOPHAGE) 500 MG tablet Take 500 mg by mouth 2 (two) times daily with a meal.    [provider]  Multiple Vitamins-Minerals (MULTIVITAMIN WITH MINERALS) tablet Take 1 tablet by mouth daily.    [provider]  olmesartan (BENICAR) 40 MG tablet Take 40 mg by mouth daily. 10/11/19   [provider]    Allergies    Norvasc [amlodipine besylate] and Nsaids  Review of Systems   Review of Systems  Constitutional: Negative for chills and fever.  HENT: Negative for ear pain and sore throat.   Eyes: Negative for pain and visual disturbance.  Respiratory: Negative for cough and shortness of breath.   Cardiovascular: Negative for chest pain and palpitations.  Gastrointestinal: Positive for abdominal pain (cramps), blood in stool and hematochezia. Negative for constipation, diarrhea, nausea, rectal pain and vomiting.  Genitourinary: Negative for dysuria and hematuria.  Musculoskeletal:  Negative for arthralgias and back pain.  Skin: Negative for color change and rash.  Neurological: Negative for seizures and syncope.  All other systems reviewed and are negative.   Physical Exam Updated Vital Signs  ED Triage Vitals [02/29/20 1118]  Enc Vitals Group     BP (!) 169/94     Pulse Rate 95     Resp 18     Temp 99 F (37.2 C)     Temp Source Oral     SpO2 97 %     Weight      Height      Head Circumference      Peak Flow      Pain Score 0     Pain Loc      Pain Edu?      Excl. in [place]?  Physical Exam Vitals and nursing note reviewed.  Constitutional:      General: She is not in acute distress.    Appearance: She is well-developed. She is not ill-appearing.  HENT:     Head: Normocephalic and atraumatic.     Nose: Nose normal.     Mouth/Throat:     Mouth: Mucous membranes are moist.  Eyes:     Extraocular Movements: Extraocular movements intact.     Conjunctiva/sclera: Conjunctivae normal.     Pupils: Pupils are equal, round, and reactive to light.  Cardiovascular:     Rate and Rhythm: Normal rate and regular rhythm.     Pulses: Normal pulses.     Heart sounds: Normal heart sounds. No murmur.  Pulmonary:     Effort: Pulmonary effort is normal. No respiratory distress.     Breath sounds: Normal breath sounds.  Abdominal:     General: There is no distension.     Palpations: Abdomen is soft.     Tenderness: There is no abdominal tenderness.  Genitourinary:    Rectum: Guaiac result positive (red stool but not much stool obtain, no melena).  Musculoskeletal:     Cervical back: Normal range of motion and neck supple.  Skin:    General: Skin is warm and dry.     Capillary Refill: Capillary refill takes less than 2 seconds.  Neurological:     General: No focal deficit present.     Mental Status: She is alert.  Psychiatric:        Mood and Affect: Mood normal.     ED Results / Procedures / Treatments   Labs (all labs ordered are listed, but only  abnormal results are displayed) Labs Reviewed  CBC WITH DIFFERENTIAL/PLATELET - Abnormal; Notable for the following components:      Result Value   RBC 6.67 (*)    HCT 47.8 (*)    MCV 71.7 (*)    MCH 21.4 (*)    MCHC 29.9 (*)    RDW 18.9 (*)    All other components within normal limits  BASIC METABOLIC PANEL - Abnormal; Notable for the following components:   Glucose, Bld 257 (*)    All other components within normal limits  HEPATIC FUNCTION PANEL - Abnormal; Notable for the following components:   Total Protein 8.2 (*)    Bilirubin, Direct 0.3 (*)    All other components within normal limits  POC OCCULT BLOOD, ED - Abnormal; Notable for the following components:   Fecal Occult Bld POSITIVE (*)    All other components within normal limits  RESPIRATORY PANEL BY RT PCR (FLU A&B, COVID)  PROTIME-INR  TYPE AND SCREEN    EKG None  Radiology CT ABDOMEN PELVIS W CONTRAST  Result Date: 02/29/2020 CLINICAL DATA:  Pain with blood in stool EXAM: CT ABDOMEN AND PELVIS WITH CONTRAST TECHNIQUE: Multidetector CT imaging of the abdomen and pelvis was performed using the standard protocol following bolus administration of intravenous contrast. CONTRAST:  152mL OMNIPAQUE IOHEXOL 300 MG/ML  SOLN COMPARISON:  September 17, 2019 FINDINGS: Lower chest: There is mild bibasilar atelectatic change. Lung bases otherwise are clear. Hepatobiliary: There is hepatic steatosis. No focal liver lesions are evident. Gallbladder is absent. There is no appreciable biliary duct dilatation. Pancreas: There is no pancreatic mass or inflammatory focus. Spleen: No splenic lesions are evident. Adrenals/Urinary Tract: Right adrenal absent, a finding also present previously. There are clips in the postoperative region from previous right adrenalectomy. Left adrenal appears  unremarkable. There is a cyst in the upper to mid right kidney measuring 2.8 x 2.6 cm. There is a cyst in the mid right kidney measuring 1.1 x 1.0 cm. There is  a cyst in the lower pole left kidney measuring 2.8 x 2.6 cm. There are subcentimeter cysts scattered throughout the left kidney. There is no evident hydronephrosis on either side. There is no evident renal or ureteral calculus on either side. Urinary bladder is midline with wall thickness within normal limits. Stomach/Bowel: There are multiple descending colonic and sigmoid diverticula. There also multiple ascending colonic diverticula with occasional transverse colon diverticula. No areas of bowel wall thickening or soft tissue stranding to suggest diverticulitis evident. There is no evident bowel obstruction. The terminal ileum appears normal. There is no evident free air or portal venous air. Vascular/Lymphatic: No abdominal aortic aneurysm. There is minimal aortic atherosclerotic plaque. Major venous structures appear patent. No evident adenopathy in the abdomen or pelvis. Reproductive: Uterus is diffusely enlarged with multiple masses throughout the uterus, several of which show calcification consistent with diffuse leiomyomatous change. This finding is similar in appearance to prior study. No extrauterine pelvic mass is evident. Other: Appendix appears normal. No abscess or ascites is evident in the abdomen or pelvis. There is a small umbilical hernia containing fat but no bowel. There is fat in the right inguinal ring. Musculoskeletal: There are foci of degenerative change in the lumbar and lower thoracic spine regions. No blastic or lytic bone lesions. No evident intramuscular lesions. IMPRESSION: 1. Extensive and fairly diffuse colonic diverticulosis without diverticulitis. No bowel wall thickening or bowel obstruction. With respect to blood in stool, it may be prudent to consider direct colonic visualization after appropriate colonic cleansing. 2.  No abscess in the abdomen or pelvis.  Appendix appears normal. 3.  Hepatic steatosis.  No focal liver lesions.  Gallbladder absent. 4. No renal or ureteral  calculus. No hydronephrosis. Urinary bladder wall thickness normal. 5.  Enlarged leiomyomatous uterus, similar to prior study. 6. Small umbilical and right inguinal hernias containing only fat, stable from prior study. 7.  Right adrenal absent. Electronically Signed   By: Lowella Grip III M.D.   On: 02/29/2020 12:52    Procedures Procedures (including critical care time)  Medications Ordered in ED Medications  sodium chloride (PF) 0.9 % injection (has no administration in time range)  iohexol (OMNIPAQUE) 300 MG/ML solution 100 mL (100 mLs Intravenous Contrast Given 02/29/20 1211)    ED Course  I have reviewed the triage vital signs and the nursing notes.  Pertinent labs & imaging results that were available during my care of the patient were reviewed by me and considered in my medical decision making (see chart for details).    MDM Rules/Calculators/A&P                      Carol Mueller is a 60 year old female with history of diverticulosis, GI bleed who presents the ED with bright red blood per rectum. Patient with 3 bloody bowel movements this morning but no pain. History of diverticular bleeds in the past. Follows with Dr. [name] with GI. Patient with no abdominal tenderness on exam. Denies any alcohol use. No history of upper GI bleeds. States that she has had a polyp in the past as well. Patient overall with unremarkable vitals. We'll get lab work, CT abdomen and pelvis. Suspect will need observation stay to trend hemoglobin.  1:14 PM patient has had several more bloody bowel  movement since being here.  However vital signs remain unremarkable.  Hemoglobin 14.  Hemoccult was positive.  BUN is unremarkable.  Electrolytes overall unremarkable.  CT scan shows extensive and fairly diffuse diverticulosis without diverticulitis.  No bowel wall thickening or bowel obstruction.  Otherwise CT scan is unremarkable.  Will talk with [facility] to make them aware of the patient.  Will admit for  further observation given ongoing hematochezia and to trend H&H.  Has had similar problems in the past and needed transfusions and colonoscopies.  This chart was dictated using voice recognition software.  Despite best efforts to proofread,  errors can occur which can change the documentation meaning.    Final Clinical Impression(s) / ED Diagnoses Final diagnoses:  Hematochezia    Rx / DC Orders ED Discharge Orders    None       Lennice Sites, DO 02/29/20 1314

## 2020-02-29 NOTE — ED Triage Notes (Signed)
Patient reports three episodes of bright red blood in stool starting at 0900 today. Hx of same. Admission in November.

## 2020-02-29 NOTE — ED Notes (Signed)
Hospitalist at bedside 

## 2020-03-01 DIAGNOSIS — K922 Gastrointestinal hemorrhage, unspecified: Secondary | ICD-10-CM

## 2020-03-01 DIAGNOSIS — K921 Melena: Secondary | ICD-10-CM | POA: Diagnosis not present

## 2020-03-01 LAB — GLUCOSE, CAPILLARY
Glucose-Capillary: 134 mg/dL — ABNORMAL HIGH (ref 70–99)
Glucose-Capillary: 144 mg/dL — ABNORMAL HIGH (ref 70–99)
Glucose-Capillary: 148 mg/dL — ABNORMAL HIGH (ref 70–99)
Glucose-Capillary: 149 mg/dL — ABNORMAL HIGH (ref 70–99)
Glucose-Capillary: 154 mg/dL — ABNORMAL HIGH (ref 70–99)
Glucose-Capillary: 201 mg/dL — ABNORMAL HIGH (ref 70–99)
Glucose-Capillary: 213 mg/dL — ABNORMAL HIGH (ref 70–99)

## 2020-03-01 LAB — BASIC METABOLIC PANEL
Anion gap: 8 (ref 5–15)
BUN: 8 mg/dL (ref 6–20)
CO2: 28 mmol/L (ref 22–32)
Calcium: 9 mg/dL (ref 8.9–10.3)
Chloride: 103 mmol/L (ref 98–111)
Creatinine, Ser: 0.82 mg/dL (ref 0.44–1.00)
GFR calc Af Amer: >60 mL/min (ref >60)
GFR calc non Af Amer: >60 mL/min (ref >60)
Glucose, Bld: 152 mg/dL — ABNORMAL HIGH (ref 70–99)
Potassium: 3.7 mmol/L (ref 3.5–5.1)
Sodium: 139 mmol/L (ref 135–145)

## 2020-03-01 LAB — IRON AND TIBC
Iron: 79 ug/dL (ref 28–170)
Saturation Ratios: 24 % (ref 10.4–31.8)
TIBC: 324 ug/dL (ref 250–450)
UIBC: 245 ug/dL

## 2020-03-01 LAB — CBC
HCT: 42.9 % (ref 36.0–46.0)
Hemoglobin: 12.9 g/dL (ref 12.0–15.0)
MCH: 21.6 pg — ABNORMAL LOW (ref 26.0–34.0)
MCHC: 30.1 g/dL (ref 30.0–36.0)
MCV: 71.9 fL — ABNORMAL LOW (ref 80.0–100.0)
Platelets: 273 10*3/uL (ref 150–400)
RBC: 5.97 MIL/uL — ABNORMAL HIGH (ref 3.87–5.11)
RDW: 18.4 % — ABNORMAL HIGH (ref 11.5–15.5)
WBC: 9.3 10*3/uL (ref 4.0–10.5)
nRBC: 0 % (ref 0.0–0.2)

## 2020-03-01 LAB — FERRITIN: Ferritin: 153 ng/mL (ref 11–307)

## 2020-03-01 MED ORDER — SODIUM CHLORIDE 0.9 % IV SOLN: INTRAVENOUS | Status: DC

## 2020-03-01 NOTE — Progress Notes (Signed)
[facility] Progress Note  CC:  Hematochezia   Subjective: She had mild left mid abdominal pain when she slept on her left side last night. She turned on her right side and her left abdominal pain resolved. Her abdomen "gurgles a lot".  No N/V. She is tolerating clear liquids. She is hungry and wishes to advance her diet. No BM today. Last time she had blood per the rectum was when she was in the ED yesterday. She urinated and saw a very small amount of red blood on the tissue last night.    Objective:  Vital signs in last 24 hours: Temp:  [97.5 F (36.4 C)-99 F (37.2 C)] 98.8 F (37.1 C) (04/16 0927) Pulse Rate:  [85-96] 87 (04/16 0927) Resp:  [16-25] 17 (04/16 0927) BP: (125-169)/(67-101) 145/81 (04/16 0927) SpO2:  [92 %-100 %] 95 % (04/16 0927) Last BM Date: 02/29/20   General:   Alert obese female pleasant in NAD. Heart: RRR, no murmur.  Pulm:  Breath sounds clear throughout.  Abdomen:  Soft, trace tenderness to the RLQ and central lower abdomen without rebound or guarding. + BS x 4 quadrants. Scar above the umbilicus intact.  Extremities:  Without edema. Neurologic:  Alert and  oriented x4;  grossly normal neurologically. Psych:  Alert and cooperative. Normal mood and affect.  Intake/Output from previous day: No intake/output data recorded. Intake/Output this shift: No intake/output data recorded.  Lab Results: Recent Labs    02/29/20 1121 02/29/20 1934 03/01/20 0531  WBC 9.8  --  9.3  HGB 14.3 12.9 12.9  HCT 47.8* 42.9 42.9  PLT 310  --  273   BMET Recent Labs    02/29/20 1121 03/01/20 0531  NA 138 139  K 4.1 3.7  CL 103 103  CO2 25 28  GLUCOSE 257* 152*  BUN 12 8  CREATININE 0.88 0.82  CALCIUM 9.2 9.0   LFT Recent Labs    02/29/20 1121  PROT 8.2*  ALBUMIN 4.2  AST 26  ALT 21  ALKPHOS 61  BILITOT 0.9  BILIDIR 0.3*  IBILI 0.6   PT/INR Recent Labs    02/29/20 1121  LABPROT 11.9  INR 0.9   Hepatitis Panel No  results for input(s): HEPBSAG, HCVAB, HEPAIGM, HEPBIGM in the last 72 hours.  CT ABDOMEN PELVIS W CONTRAST  Result Date: 02/29/2020 CLINICAL DATA:  Pain with blood in stool EXAM: CT ABDOMEN AND PELVIS WITH CONTRAST TECHNIQUE: Multidetector CT imaging of the abdomen and pelvis was performed using the standard protocol following bolus administration of intravenous contrast. CONTRAST:  123mL OMNIPAQUE IOHEXOL 300 MG/ML  SOLN COMPARISON:  September 17, 2019 FINDINGS: Lower chest: There is mild bibasilar atelectatic change. Lung bases otherwise are clear. Hepatobiliary: There is hepatic steatosis. No focal liver lesions are evident. Gallbladder is absent. There is no appreciable biliary duct dilatation. Pancreas: There is no pancreatic mass or inflammatory focus. Spleen: No splenic lesions are evident. Adrenals/Urinary Tract: Right adrenal absent, a finding also present previously. There are clips in the postoperative region from previous right adrenalectomy. Left adrenal appears unremarkable. There is a cyst in the upper to mid right kidney measuring 2.8 x 2.6 cm. There is a cyst in the mid right kidney measuring 1.1 x 1.0 cm. There is a cyst in the lower pole left kidney measuring 2.8 x 2.6 cm. There are subcentimeter cysts scattered throughout the left kidney. There is no evident hydronephrosis on either side. There is no evident renal or ureteral  calculus on either side. Urinary bladder is midline with wall thickness within normal limits. Stomach/Bowel: There are multiple descending colonic and sigmoid diverticula. There also multiple ascending colonic diverticula with occasional transverse colon diverticula. No areas of bowel wall thickening or soft tissue stranding to suggest diverticulitis evident. There is no evident bowel obstruction. The terminal ileum appears normal. There is no evident free air or portal venous air. Vascular/Lymphatic: No abdominal aortic aneurysm. There is minimal aortic atherosclerotic  plaque. Major venous structures appear patent. No evident adenopathy in the abdomen or pelvis. Reproductive: Uterus is diffusely enlarged with multiple masses throughout the uterus, several of which show calcification consistent with diffuse leiomyomatous change. This finding is similar in appearance to prior study. No extrauterine pelvic mass is evident. Other: Appendix appears normal. No abscess or ascites is evident in the abdomen or pelvis. There is a small umbilical hernia containing fat but no bowel. There is fat in the right inguinal ring. Musculoskeletal: There are foci of degenerative change in the lumbar and lower thoracic spine regions. No blastic or lytic bone lesions. No evident intramuscular lesions. IMPRESSION: 1. Extensive and fairly diffuse colonic diverticulosis without diverticulitis. No bowel wall thickening or bowel obstruction. With respect to blood in stool, it may be prudent to consider direct colonic visualization after appropriate colonic cleansing. 2.  No abscess in the abdomen or pelvis.  Appendix appears normal. 3.  Hepatic steatosis.  No focal liver lesions.  Gallbladder absent. 4. No renal or ureteral calculus. No hydronephrosis. Urinary bladder wall thickness normal. 5.  Enlarged leiomyomatous uterus, similar to prior study. 6. Small umbilical and right inguinal hernias containing only fat, stable from prior study. 7.  Right adrenal absent. Electronically Signed   By: Lowella Grip III M.D.   On: 02/29/2020 12:52    Assessment / Plan: 14. 59 year old female with a history of recurrent diverticular bleed presented to the ED 4/15 with painless hematochezia, most likely diverticular bleed. CTAP showed pandiverticulosis. Stable Hg 12.9. No further hematochezia since admission to the floor.  She is afebrile. WBC 9.3. Hemodynamically stable.  -No plans for a colonoscopy at this time  -If active rectal bleeding recurs will need abd/pelvic CT angiogram vs tagged RBC/IR evaluation    -Repeat CBC in am. Stat H/H if she has hematochezia  -Soft diet   2. Microcytic anemia. Hg 14.3 -> 12.9 -> 12.9. Hg 42.9. MCV 71.9.  -Check iron, iron saturation, TIBC and Ferritin level  -Consider IV iron if iron deficient   2. History of colon polyps, current recall is 09/2026  3. DM II  4. Cushing syndrome   Further recommendations per Dr. Havery Moros   Active Problems:   Hematochezia     LOS: 0 days   Noralyn Pick  03/01/2020, 9:55 AM

## 2020-03-01 NOTE — Progress Notes (Signed)
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  CT scan of the abdomen pelvis was unremarkable for any acute findings.  Subjective   Patient seen and examined, denies any complaints.  Did not have bloody bowel movement since 12:30 PM yesterday.   Assessment/Plan:   1. Hematochezia-secondary diverticular bleed, patient has pandiverticulosis, not a surgical candidate.  GI was consulted.  Recommend to observe 1 more day and if patient starts to have further rectal bleeding then consider CTA/IR consult. 2. Anemia-hemoglobin dropped from 14.3-12.9.  We will continue to monitor patient's hemoglobin.  And transfuse for hemoglobin less than 7.  At this time patient is hemodynamically stable. 3. Diabetes mellitus type 2-continue sliding scale insulin with NovoLog. 4. Hypertension-continue Coreg, irbesartan    SpO2: 98 %   COVID-19 Labs  Recent Labs    03/01/20 1110  FERRITIN 153    Lab Results  Component Value Date   SARSCOV2NAA NEGATIVE 02/29/2020   [place] NEGATIVE 10/02/2019   [place] NEGATIVE 09/17/2019   [place] NEGATIVE 05/24/2019     CBG: Recent Labs  Lab 02/29/20 2041 03/01/20 0018 03/01/20 0445 03/01/20 0730 03/01/20 1201  GLUCAP 100* 148* 134* 154* 149*    CBC: Recent Labs  Lab 02/29/20 1121 02/29/20 1934 03/01/20 0531  WBC 9.8  --  9.3  NEUTROABS 5.7  --   --   HGB 14.3 12.9 12.9  HCT 47.8* 42.9 42.9  MCV 71.7*  --  71.9*  PLT 310  --  123456    Basic Metabolic Panel: Recent Labs  Lab 02/29/20 1121 03/01/20 0531  NA 138 139  K 4.1 3.7  CL 103 103  CO2 25 28  GLUCOSE 257* 152*  BUN 12 8  CREATININE 0.88 0.82  CALCIUM 9.2 9.0     Liver Function  Tests: Recent Labs  Lab 02/29/20 1121  AST 26  ALT 21  ALKPHOS 61  BILITOT 0.9  PROT 8.2*  ALBUMIN 4.2    DVT prophylaxis: SCDs  Code Status: Full code  Family Communication: No family at bedside  Disposition Plan: likely home when medically ready for discharge     Scheduled medications:  . carvedilol  3.125 mg Oral BID WC  . insulin aspart  0-15 Units Subcutaneous Q4H  . insulin glargine  15 Units Subcutaneous QPM  . irbesartan  300 mg Oral Daily    Consultants:  Gastroenterology  Procedures:    Antibiotics:   Anti-infectives (From admission, onward)   None       Objective   Vitals:   03/01/20 0016 03/01/20 0449 03/01/20 0927 03/01/20 1507  BP: 125/67 137/76 (!) 145/81 140/80  Pulse: 86 87 87 93  Resp: 18 16 17 18   Temp: 98.4 F (36.9 C) 98 F (36.7 C) 98.8 F (37.1 C) 98.9 F (37.2 C)  TempSrc: Oral Oral Oral Oral  SpO2: 97% 92% 95% 98%    Physical Examination:   General-appears in no acute distress Heart-S1-S2, regular, no murmur auscultated Lungs-clear to auscultation bilaterally, no wheezing or crackles auscultated Abdomen-soft, nontender, no organomegaly Extremities-no edema in the lower extremities Neuro-alert, oriented x3, no focal deficit noted   Data Reviewed:   Recent Results (from the past 240 hour(s))  Respiratory Panel by RT PCR (Flu A&B, Covid) -  Nasopharyngeal Swab     Status: None   Collection Time: 02/29/20  2:14 PM   Specimen: Nasopharyngeal Swab  Result Value Ref Range Status   SARS Coronavirus 2 by RT PCR NEGATIVE NEGATIVE Final    Comment: (NOTE) SARS-CoV-2 target nucleic acids are NOT DETECTED. The SARS-CoV-2 RNA is generally detectable in upper respiratoy specimens during the acute phase of infection. The lowest concentration of SARS-CoV-2 viral copies this assay can detect is 131 copies/mL. A negative result does not preclude SARS-Cov-2 infection and should not be used as the sole basis for treatment  or other patient management decisions. A negative result may occur with  improper specimen collection/handling, submission of specimen other than nasopharyngeal swab, presence of viral mutation(s) within the areas targeted by this assay, and inadequate number of viral copies (<131 copies/mL). A negative result must be combined with clinical observations, patient history, and epidemiological information. The expected result is Negative. Fact Sheet for Patients:  PinkCheek.be Fact Sheet for Healthcare Providers:  GravelBags.it This test is not yet ap proved or cleared by the [place] FDA and  has been authorized for detection and/or diagnosis of SARS-CoV-2 by FDA under an Emergency Use Authorization (EUA). This EUA will remain  in effect (meaning this test can be used) for the duration of the COVID-19 declaration under Section 564(b)(1) of the Act, 21 U.S.C. section 360bbb-3(b)(1), unless the authorization is terminated or revoked sooner.    Influenza A by PCR NEGATIVE NEGATIVE Final   Influenza B by PCR NEGATIVE NEGATIVE Final    Comment: (NOTE) The Xpert Xpress SARS-CoV-2/FLU/RSV assay is intended as an aid in  the diagnosis of influenza from Nasopharyngeal swab specimens and  should not be used as a sole basis for treatment. Nasal washings and  aspirates are unacceptable for Xpert Xpress SARS-CoV-2/FLU/RSV  testing. Fact Sheet for Patients: PinkCheek.be Fact Sheet for Healthcare Providers: GravelBags.it This test is not yet approved or cleared by the [place] FDA and  has been authorized for detection and/or diagnosis of SARS-CoV-2 by  FDA under an Emergency Use Authorization (EUA). This EUA will remain  in effect (meaning this test can be used) for the duration of the  Covid-19 declaration under Section 564(b)(1) of the Act, 21  U.S.C. section  360bbb-3(b)(1), unless the authorization is  terminated or revoked. Performed at [facility], [place] [address] [place], [place] [address]     No results for input(s): LIPASE, AMYLASE in the last 168 hours. No results for input(s): AMMONIA in the last 168 hours.  Cardiac Enzymes: No results for input(s): CKTOTAL, CKMB, CKMBINDEX, TROPONINI in the last 168 hours. BNP (last 3 results) No results for input(s): BNP in the last 8760 hours.  ProBNP (last 3 results) No results for input(s): PROBNP in the last 8760 hours.  Studies:  CT ABDOMEN PELVIS W CONTRAST  Result Date: 02/29/2020 CLINICAL DATA:  Pain with blood in stool EXAM: CT ABDOMEN AND PELVIS WITH CONTRAST TECHNIQUE: Multidetector CT imaging of the abdomen and pelvis was performed using the standard protocol following bolus administration of intravenous contrast. CONTRAST:  127mL OMNIPAQUE IOHEXOL 300 MG/ML  SOLN COMPARISON:  September 17, 2019 FINDINGS: Lower chest: There is mild bibasilar atelectatic change. Lung bases otherwise are clear. Hepatobiliary: There is hepatic steatosis. No focal liver lesions are evident. Gallbladder is absent. There is no appreciable biliary duct dilatation. Pancreas: There is no pancreatic mass or inflammatory focus. Spleen: No splenic lesions are evident. Adrenals/Urinary Tract: Right adrenal absent, a finding  also present previously. There are clips in the postoperative region from previous right adrenalectomy. Left adrenal appears unremarkable. There is a cyst in the upper to mid right kidney measuring 2.8 x 2.6 cm. There is a cyst in the mid right kidney measuring 1.1 x 1.0 cm. There is a cyst in the lower pole left kidney measuring 2.8 x 2.6 cm. There are subcentimeter cysts scattered throughout the left kidney. There is no evident hydronephrosis on either side. There is no evident renal or ureteral calculus on either side. Urinary bladder is midline with wall thickness within normal  limits. Stomach/Bowel: There are multiple descending colonic and sigmoid diverticula. There also multiple ascending colonic diverticula with occasional transverse colon diverticula. No areas of bowel wall thickening or soft tissue stranding to suggest diverticulitis evident. There is no evident bowel obstruction. The terminal ileum appears normal. There is no evident free air or portal venous air. Vascular/Lymphatic: No abdominal aortic aneurysm. There is minimal aortic atherosclerotic plaque. Major venous structures appear patent. No evident adenopathy in the abdomen or pelvis. Reproductive: Uterus is diffusely enlarged with multiple masses throughout the uterus, several of which show calcification consistent with diffuse leiomyomatous change. This finding is similar in appearance to prior study. No extrauterine pelvic mass is evident. Other: Appendix appears normal. No abscess or ascites is evident in the abdomen or pelvis. There is a small umbilical hernia containing fat but no bowel. There is fat in the right inguinal ring. Musculoskeletal: There are foci of degenerative change in the lumbar and lower thoracic spine regions. No blastic or lytic bone lesions. No evident intramuscular lesions. IMPRESSION: 1. Extensive and fairly diffuse colonic diverticulosis without diverticulitis. No bowel wall thickening or bowel obstruction. With respect to blood in stool, it may be prudent to consider direct colonic visualization after appropriate colonic cleansing. 2.  No abscess in the abdomen or pelvis.  Appendix appears normal. 3.  Hepatic steatosis.  No focal liver lesions.  Gallbladder absent. 4. No renal or ureteral calculus. No hydronephrosis. Urinary bladder wall thickness normal. 5.  Enlarged leiomyomatous uterus, similar to prior study. 6. Small umbilical and right inguinal hernias containing only fat, stable from prior study. 7.  Right adrenal absent. Electronically Signed   By: Lowella Grip III M.D.   On:  02/29/2020 12:52     Admission status: The appropriate admission status for this patient is INPATIENT. Inpatient status is judged to be reasonable and necessary in order to provide the required intensity of service to ensure the patient's safety. The patient's presenting symptoms, physical exam findings, and initial radiographic and laboratory data in the context of their chronic comorbidities is felt to place them at high risk for further clinical deterioration. Furthermore, it is not anticipated that the patient will be medically stable for discharge from the hospital within 2 midnights of admission. The following factors support the admission status of inpatient.    The patient's presenting symptoms include rectal bleeding. The worrisome physical exam findings include none. The initial radiographic and laboratory data are worrisome because of diverticulosis. The chronic co-morbidities include diabetes mellitus, hypertension.    * I certify that at the point of admission it is my clinical judgment that the patient will require inpatient hospital care spanning beyond 2 midnights from the point of admission due to high intensity of service, high risk for further deterioration and high frequency of surveillance required.Oswald Hillock   [facility] If 7PM-7AM, please contact night-coverage at [email or web address], Office  [phone]  03/01/2020, 3:21 PM  LOS: 0 days

## 2020-03-02 DIAGNOSIS — K922 Gastrointestinal hemorrhage, unspecified: Secondary | ICD-10-CM | POA: Diagnosis present

## 2020-03-02 DIAGNOSIS — Z803 Family history of malignant neoplasm of breast: Secondary | ICD-10-CM | POA: Diagnosis not present

## 2020-03-02 DIAGNOSIS — K921 Melena: Secondary | ICD-10-CM | POA: Diagnosis not present

## 2020-03-02 DIAGNOSIS — Z8249 Family history of ischemic heart disease and other diseases of the circulatory system: Secondary | ICD-10-CM | POA: Diagnosis not present

## 2020-03-02 DIAGNOSIS — Z20822 Contact with and (suspected) exposure to covid-19: Secondary | ICD-10-CM | POA: Diagnosis present

## 2020-03-02 DIAGNOSIS — Z8041 Family history of malignant neoplasm of ovary: Secondary | ICD-10-CM | POA: Diagnosis not present

## 2020-03-02 DIAGNOSIS — Z86010 Personal history of colonic polyps: Secondary | ICD-10-CM | POA: Diagnosis not present

## 2020-03-02 DIAGNOSIS — Z8639 Personal history of other endocrine, nutritional and metabolic disease: Secondary | ICD-10-CM | POA: Diagnosis not present

## 2020-03-02 DIAGNOSIS — E669 Obesity, unspecified: Secondary | ICD-10-CM | POA: Diagnosis present

## 2020-03-02 DIAGNOSIS — Z79899 Other long term (current) drug therapy: Secondary | ICD-10-CM | POA: Diagnosis not present

## 2020-03-02 DIAGNOSIS — Z833 Family history of diabetes mellitus: Secondary | ICD-10-CM | POA: Diagnosis not present

## 2020-03-02 DIAGNOSIS — Z886 Allergy status to analgesic agent status: Secondary | ICD-10-CM | POA: Diagnosis not present

## 2020-03-02 DIAGNOSIS — D509 Iron deficiency anemia, unspecified: Secondary | ICD-10-CM | POA: Diagnosis present

## 2020-03-02 DIAGNOSIS — Z794 Long term (current) use of insulin: Secondary | ICD-10-CM | POA: Diagnosis not present

## 2020-03-02 DIAGNOSIS — I10 Essential (primary) hypertension: Secondary | ICD-10-CM | POA: Diagnosis present

## 2020-03-02 DIAGNOSIS — E119 Type 2 diabetes mellitus without complications: Secondary | ICD-10-CM | POA: Diagnosis present

## 2020-03-02 DIAGNOSIS — K5731 Diverticulosis of large intestine without perforation or abscess with bleeding: Secondary | ICD-10-CM | POA: Diagnosis present

## 2020-03-02 DIAGNOSIS — E785 Hyperlipidemia, unspecified: Secondary | ICD-10-CM | POA: Diagnosis present

## 2020-03-02 DIAGNOSIS — E282 Polycystic ovarian syndrome: Secondary | ICD-10-CM | POA: Diagnosis present

## 2020-03-02 DIAGNOSIS — Z888 Allergy status to other drugs, medicaments and biological substances status: Secondary | ICD-10-CM | POA: Diagnosis not present

## 2020-03-02 DIAGNOSIS — K760 Fatty (change of) liver, not elsewhere classified: Secondary | ICD-10-CM | POA: Diagnosis present

## 2020-03-02 LAB — CBC
HCT: 39.5 % (ref 36.0–46.0)
Hemoglobin: 11.7 g/dL — ABNORMAL LOW (ref 12.0–15.0)
MCH: 21.3 pg — ABNORMAL LOW (ref 26.0–34.0)
MCHC: 29.6 g/dL — ABNORMAL LOW (ref 30.0–36.0)
MCV: 71.8 fL — ABNORMAL LOW (ref 80.0–100.0)
Platelets: 281 10*3/uL (ref 150–400)
RBC: 5.5 MIL/uL — ABNORMAL HIGH (ref 3.87–5.11)
RDW: 18.1 % — ABNORMAL HIGH (ref 11.5–15.5)
WBC: 8.5 10*3/uL (ref 4.0–10.5)
nRBC: 0 % (ref 0.0–0.2)

## 2020-03-02 LAB — GLUCOSE, CAPILLARY
Glucose-Capillary: 141 mg/dL — ABNORMAL HIGH (ref 70–99)
Glucose-Capillary: 163 mg/dL — ABNORMAL HIGH (ref 70–99)
Glucose-Capillary: 186 mg/dL — ABNORMAL HIGH (ref 70–99)
Glucose-Capillary: 167 mg/dL — ABNORMAL HIGH (ref 70–99)
Glucose-Capillary: 254 mg/dL — ABNORMAL HIGH (ref 70–99)

## 2020-03-02 NOTE — Discharge Instructions (Signed)

## 2020-03-02 NOTE — Discharge Summary (Addendum)
Physician Discharge Summary  Carol Mueller [id number] DOB: 08-06-1961 DOA: 02/29/2020  PCP: [name], DO  Admit date: 02/29/2020 Discharge date: 03/03/2020  Time spent: 50* minutes  Recommendations for Outpatient Follow-up:  1. Follow-up PCP in 2 weeks 2. Check CBC in 2 weeks.    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  CT scan of the abdomen pelvis was unremarkable for any acute findings.  It showed diverticulosis without diverticulitis.  Hospital Course:  1. Hematochezia-resolved, secondary to diverticular bleed, patient has pandiverticulosis, not a surgical candidate.  GI was consulted.  No plan for intervention unless placement bled again.  Patient had small bloody bowel movement yesterday so discharge was held.  Called and discussed with Dr. Carol Ada who recommended to observe overnight.  This morning patient is feeling better.  Hemoglobin is 11.4 dropped from 11.7 yesterday.  She had very small bowel movements yesterday with very minimal blood on toilet tissue paper.  No frank blood in the toilet bowel as she noticed when she came initially in the hospital.  I called and discussed Dr. Prentiss Bells okay to discharge and follow-up with PCP/[facility] as outpatient. 2. Anemia-hemoglobin dropped from 14.3-12.9.  Patient started on IV normal saline yesterday.  Hemoglobin this morning is 11.7.  No further rectal bleeding.  Patient can be discharged home and follow-up with patient PCP.  She will need a CBC as outpatient in 2 weeks. 3. Diabetes mellitus type 2-continue home medications 4. Hypertension-continue Coreg,  irbesartan  Procedures:  None  Consultations: Gastroenterology  Discharge Exam: Vitals:   03/02/20 0405 03/02/20 0813  BP: (!) 141/88 140/86  Pulse: 87 84  Resp: 16   Temp: 97.7 F (36.5 C) 99.2 F (37.3 C)  SpO2: 96% 99%    General: Appears in no acute distress Cardiovascular: S1-S2, regular Respiratory: Clear to auscultation bilaterally  Discharge Instructions   Discharge Instructions    Diet - low sodium heart healthy   Complete by: As directed    Increase activity slowly   Complete by: As directed      Allergies as of 03/02/2020      Reactions   Norvasc [amlodipine Besylate] Swelling   ANGIOEDEMA   Nsaids Other (See Comments)   GI upset/ GI bleed      Medication List    TAKE these medications   acetaminophen 325 MG tablet Commonly known as: TYLENOL Take 2 tablets (650 mg total) by mouth every 6 (six) hours as needed for mild pain (or Fever >/= 101).   atorvastatin 10 MG tablet Commonly known as: LIPITOR Take 10 mg by mouth daily.   Basaglar KwikPen 100 UNIT/ML Inject 30 Units into the skin daily. What changed: Another medication with the same name was removed. Continue taking this medication, and follow the directions you see here.   carvedilol 3.125 MG tablet Commonly known as: COREG Take 3.125 mg by mouth 2 (two) times daily with a meal.   dicyclomine 20 MG tablet Commonly known as: BENTYL TAKE 1 TABLET BY MOUTH TWICE A DAY What changed:   when to take this  reasons to take this   glimepiride 2 MG tablet Commonly known as: AMARYL Take 2 mg by mouth daily.   Jardiance 10 MG Tabs  tablet Generic drug: empagliflozin Take 10 mg by mouth daily.   metFORMIN 1000 MG tablet Commonly known as: GLUCOPHAGE Take 500 mg by mouth 2 (two) times daily.   multivitamin with minerals tablet Take 1 tablet by mouth daily.   olmesartan 40 MG tablet Commonly known as: BENICAR Take 40 mg by mouth daily.      Allergies  Allergen Reactions  .  Norvasc [Amlodipine Besylate] Swelling    ANGIOEDEMA  . Nsaids Other (See Comments)    GI upset/ GI bleed   Follow-up Information    [name], DO Follow up in 2 week(s).   Specialty: Family Medicine Contact information: [address] [place] [address] [phone]            The results of significant diagnostics from this hospitalization (including imaging, microbiology, ancillary and laboratory) are listed below for reference.    Significant Diagnostic Studies: CT ABDOMEN PELVIS W CONTRAST  Result Date: 02/29/2020 CLINICAL DATA:  Pain with blood in stool EXAM: CT ABDOMEN AND PELVIS WITH CONTRAST TECHNIQUE: Multidetector CT imaging of the abdomen and pelvis was performed using the standard protocol following bolus administration of intravenous contrast. CONTRAST:  164mL OMNIPAQUE IOHEXOL 300 MG/ML  SOLN COMPARISON:  September 17, 2019 FINDINGS: Lower chest: There is mild bibasilar atelectatic change. Lung bases otherwise are clear. Hepatobiliary: There is hepatic steatosis. No focal liver lesions are evident. Gallbladder is absent. There is no appreciable biliary duct dilatation. Pancreas: There is no pancreatic mass or inflammatory focus. Spleen: No splenic lesions are evident. Adrenals/Urinary Tract: Right adrenal absent, a finding also present previously. There are clips in the postoperative region from previous right adrenalectomy. Left adrenal appears unremarkable. There is a cyst in the upper to mid right kidney measuring 2.8 x 2.6 cm. There is a cyst in the mid right kidney measuring 1.1 x 1.0 cm. There is a cyst in the lower pole left kidney measuring 2.8 x 2.6 cm. There are subcentimeter cysts scattered throughout the left kidney. There is no evident hydronephrosis on either side. There is no evident renal or ureteral calculus on either side. Urinary bladder is midline with wall thickness within normal limits. Stomach/Bowel: There are multiple descending  colonic and sigmoid diverticula. There also multiple ascending colonic diverticula with occasional transverse colon diverticula. No areas of bowel wall thickening or soft tissue stranding to suggest diverticulitis evident. There is no evident bowel obstruction. The terminal ileum appears normal. There is no evident free air or portal venous air. Vascular/Lymphatic: No abdominal aortic aneurysm. There is minimal aortic atherosclerotic plaque. Major venous structures appear patent. No evident adenopathy in the abdomen or pelvis. Reproductive: Uterus is diffusely enlarged with multiple masses throughout the uterus, several of which show calcification consistent with diffuse leiomyomatous change. This finding is similar in appearance to prior study. No extrauterine pelvic mass is evident. Other: Appendix appears normal. No abscess or ascites is evident in the abdomen or pelvis. There is a small umbilical hernia containing fat but no bowel. There is fat in the right inguinal ring. Musculoskeletal: There are foci of degenerative change in the lumbar and lower thoracic spine regions. No blastic or lytic bone lesions. No evident intramuscular lesions. IMPRESSION: 1. Extensive and fairly diffuse colonic diverticulosis without diverticulitis. No bowel wall thickening or bowel obstruction. With respect to blood in stool, it may be prudent to consider direct colonic visualization after appropriate colonic cleansing. 2.  No abscess in the abdomen or pelvis.  Appendix appears normal. 3.  Hepatic steatosis.  No focal liver lesions.  Gallbladder absent. 4. No renal or ureteral calculus. No hydronephrosis. Urinary bladder wall thickness normal. 5.  Enlarged leiomyomatous uterus, similar to prior study. 6. Small umbilical and right inguinal hernias containing only fat, stable from prior study. 7.  Right adrenal absent. Electronically Signed   By: Lowella Grip III M.D.   On: 02/29/2020 12:52    Microbiology: Recent Results  (from the past 240 hour(s))  Respiratory Panel by RT PCR (Flu A&B, Covid) - Nasopharyngeal Swab     Status: None   Collection Time: 02/29/20  2:14 PM   Specimen: Nasopharyngeal Swab  Result Value Ref Range Status   SARS Coronavirus 2 by RT PCR NEGATIVE NEGATIVE Final    Comment: (NOTE) SARS-CoV-2 target nucleic acids are NOT DETECTED. The SARS-CoV-2 RNA is generally detectable in upper respiratoy specimens during the acute phase of infection. The lowest concentration of SARS-CoV-2 viral copies this assay can detect is 131 copies/mL. A negative result does not preclude SARS-Cov-2 infection and should not be used as the sole basis for treatment or other patient management decisions. A negative result may occur with  improper specimen collection/handling, submission of specimen other than nasopharyngeal swab, presence of viral mutation(s) within the areas targeted by this assay, and inadequate number of viral copies (<131 copies/mL). A negative result must be combined with clinical observations, patient history, and epidemiological information. The expected result is Negative. Fact Sheet for Patients:  PinkCheek.be Fact Sheet for Healthcare Providers:  GravelBags.it This test is not yet ap proved or cleared by the [place] FDA and  has been authorized for detection and/or diagnosis of SARS-CoV-2 by FDA under an Emergency Use Authorization (EUA). This EUA will remain  in effect (meaning this test can be used) for the duration of the COVID-19 declaration under Section 564(b)(1) of the Act, 21 U.S.C. section 360bbb-3(b)(1), unless the authorization is terminated or revoked sooner.    Influenza A by PCR NEGATIVE NEGATIVE Final   Influenza B by PCR NEGATIVE NEGATIVE Final    Comment: (NOTE) The Xpert Xpress SARS-CoV-2/FLU/RSV assay is intended as an aid in  the diagnosis of influenza from Nasopharyngeal swab specimens and   should not be used as a sole basis for treatment. Nasal washings and  aspirates are unacceptable for Xpert Xpress SARS-CoV-2/FLU/RSV  testing. Fact Sheet for Patients: PinkCheek.be Fact Sheet for Healthcare Providers: GravelBags.it This test is not yet approved or cleared by the [place] FDA and  has been authorized for detection and/or diagnosis of SARS-CoV-2 by  FDA under an Emergency Use Authorization (EUA). This EUA will remain  in effect (meaning this test can be used) for the duration of the  Covid-19 declaration under Section 564(b)(1) of the Act, 21  U.S.C. section 360bbb-3(b)(1), unless the authorization is  terminated or revoked. Performed at [facility], [place] [address] [place], [place] [address]      Labs: Basic Metabolic Panel: Recent Labs  Lab 02/29/20 1121 03/01/20 0531  NA 138 139  K 4.1 3.7  CL 103 103  CO2 25 28  GLUCOSE 257* 152*  BUN 12 8  CREATININE 0.88 0.82  CALCIUM 9.2 9.0   Liver Function Tests: Recent Labs  Lab 02/29/20 1121  AST 26  ALT 21  ALKPHOS 61  BILITOT 0.9  PROT 8.2*  ALBUMIN 4.2   No results for input(s): LIPASE, AMYLASE in the last 168 hours. No results for input(s): AMMONIA in the last 168 hours. CBC:  Recent Labs  Lab 02/29/20 1121 02/29/20 1934 03/01/20 0531 03/02/20 0552  WBC 9.8  --  9.3 8.5  NEUTROABS 5.7  --   --   --   HGB 14.3 12.9 12.9 11.7*  HCT 47.8* 42.9 42.9 39.5  MCV 71.7*  --  71.9* 71.8*  PLT 310  --  273 281    CBG: Recent Labs  Lab 03/01/20 1702 03/01/20 2039 03/01/20 2346 03/02/20 0407 03/02/20 0800  GLUCAP 201* 213* 144* 141* 163*       Signed:  Oswald Hillock MD.  [facility] 03/02/2020, 10:21 AM

## 2020-03-02 NOTE — Progress Notes (Addendum)
Patient was supposed to be discharged today, however she had small bloody bowel movement.  Spoke to gastroenterologist on-call Dr. Benson Norway, who recommends to hold the discharge for today and follow CBC in a.m.  If patient has another  episode of bleeding again, will consult IR for embolization/CT angiogram. We will hold discharge for today.

## 2020-03-03 LAB — GLUCOSE, CAPILLARY
Glucose-Capillary: 137 mg/dL — ABNORMAL HIGH (ref 70–99)
Glucose-Capillary: 175 mg/dL — ABNORMAL HIGH (ref 70–99)
Glucose-Capillary: 192 mg/dL — ABNORMAL HIGH (ref 70–99)

## 2020-03-03 LAB — CBC
HCT: 37.9 % (ref 36.0–46.0)
Hemoglobin: 11.4 g/dL — ABNORMAL LOW (ref 12.0–15.0)
MCH: 21.6 pg — ABNORMAL LOW (ref 26.0–34.0)
MCHC: 30.1 g/dL (ref 30.0–36.0)
MCV: 71.6 fL — ABNORMAL LOW (ref 80.0–100.0)
Platelets: 284 10*3/uL (ref 150–400)
RBC: 5.29 MIL/uL — ABNORMAL HIGH (ref 3.87–5.11)
RDW: 17.7 % — ABNORMAL HIGH (ref 11.5–15.5)
WBC: 8.5 10*3/uL (ref 4.0–10.5)
nRBC: 0 % (ref 0.0–0.2)

## 2020-03-03 NOTE — Progress Notes (Signed)
Nsg Discharge Note  Admit Date:  02/29/2020 Discharge date: 03/03/2020   Altamease Oiler to be D/C'd Home per MD order.  AVS completed.  Copy for chart, and copy for patient signed, and dated. Patient/caregiver able to verbalize understanding.  Discharge Medication: Allergies as of 03/03/2020       Reactions   Norvasc [amlodipine Besylate] Swelling   ANGIOEDEMA   Nsaids Other (See Comments)   GI upset/ GI bleed        Medication List     TAKE these medications    acetaminophen 325 MG tablet Commonly known as: TYLENOL Take 2 tablets (650 mg total) by mouth every 6 (six) hours as needed for mild pain (or Fever >/= 101).   atorvastatin 10 MG tablet Commonly known as: LIPITOR Take 10 mg by mouth daily.   Basaglar KwikPen 100 UNIT/ML Inject 30 Units into the skin daily. What changed: Another medication with the same name was removed. Continue taking this medication, and follow the directions you see here.   carvedilol 3.125 MG tablet Commonly known as: COREG Take 3.125 mg by mouth 2 (two) times daily with a meal.   dicyclomine 20 MG tablet Commonly known as: BENTYL TAKE 1 TABLET BY MOUTH TWICE A DAY What changed:  when to take this reasons to take this   glimepiride 2 MG tablet Commonly known as: AMARYL Take 2 mg by mouth daily.   Jardiance 10 MG Tabs tablet Generic drug: empagliflozin Take 10 mg by mouth daily.   metFORMIN 1000 MG tablet Commonly known as: GLUCOPHAGE Take 500 mg by mouth 2 (two) times daily.   multivitamin with minerals tablet Take 1 tablet by mouth daily.   olmesartan 40 MG tablet Commonly known as: BENICAR Take 40 mg by mouth daily.        Discharge Assessment: Vitals:   03/02/20 2030 03/03/20 0446  BP: 110/72 (!) 145/82  Pulse: 78 85  Resp: 16 18  Temp: 98.4 F (36.9 C) 97.7 F (36.5 C)  SpO2: 97% 100%   Skin clean, dry and intact without evidence of skin break down, no evidence of skin tears noted. IV catheter  discontinued intact. Site without signs and symptoms of complications - no redness or edema noted at insertion site, patient denies c/o pain - only slight tenderness at site.  Dressing with slight pressure applied.  D/c Instructions-Education: Discharge instructions given to patient/family with verbalized understanding. D/c education completed with patient/family including follow up instructions, medication list, d/c activities limitations if indicated, with other d/c instructions as indicated by MD - patient able to verbalize understanding, all questions fully answered. Patient instructed to return to ED, call 911, or call MD for any changes in condition.  Patient escorted via [place], and D/C home via private auto.  Eustace Pen, RN 03/03/2020 9:39 AM

## 2020-09-23 ENCOUNTER — Encounter (HOSPITAL_COMMUNITY): Payer: Self-pay | Admitting: Internal Medicine

## 2020-09-23 ENCOUNTER — Observation Stay (HOSPITAL_COMMUNITY)
Admission: EM | Admit: 2020-09-23 | Discharge: 2020-09-25 | Disposition: A | Discharge status: Home / Self Care | Payer: No Typology Code available for payment source | Attending: Internal Medicine | Admitting: Internal Medicine

## 2020-09-23 ENCOUNTER — Other Ambulatory Visit: Payer: Self-pay

## 2020-09-23 DIAGNOSIS — K922 Gastrointestinal hemorrhage, unspecified: Principal | ICD-10-CM | POA: Insufficient documentation

## 2020-09-23 DIAGNOSIS — E785 Hyperlipidemia, unspecified: Secondary | ICD-10-CM | POA: Diagnosis not present

## 2020-09-23 DIAGNOSIS — R1084 Generalized abdominal pain: Secondary | ICD-10-CM | POA: Diagnosis present

## 2020-09-23 DIAGNOSIS — I10 Essential (primary) hypertension: Secondary | ICD-10-CM | POA: Insufficient documentation

## 2020-09-23 DIAGNOSIS — E119 Type 2 diabetes mellitus without complications: Secondary | ICD-10-CM

## 2020-09-23 DIAGNOSIS — Z79899 Other long term (current) drug therapy: Secondary | ICD-10-CM | POA: Insufficient documentation

## 2020-09-23 DIAGNOSIS — Z794 Long term (current) use of insulin: Secondary | ICD-10-CM

## 2020-09-23 DIAGNOSIS — Z20822 Contact with and (suspected) exposure to covid-19: Secondary | ICD-10-CM | POA: Insufficient documentation

## 2020-09-23 LAB — COMPREHENSIVE METABOLIC PANEL
ALT: 18 U/L (ref 0–44)
AST: 16 U/L (ref 15–41)
Albumin: 4 g/dL (ref 3.5–5.0)
Alkaline Phosphatase: 48 U/L (ref 38–126)
Anion gap: 7 (ref 5–15)
BUN: 13 mg/dL (ref 6–20)
CO2: 28 mmol/L (ref 22–32)
Calcium: 9 mg/dL (ref 8.9–10.3)
Chloride: 105 mmol/L (ref 98–111)
Creatinine, Ser: 0.81 mg/dL (ref 0.44–1.00)
GFR, Estimated: >60 mL/min (ref >60)
Glucose, Bld: 119 mg/dL — ABNORMAL HIGH (ref 70–99)
Potassium: 3.2 mmol/L — ABNORMAL LOW (ref 3.5–5.1)
Sodium: 140 mmol/L (ref 135–145)
Total Bilirubin: 0.6 mg/dL (ref 0.3–1.2)
Total Protein: 7.6 g/dL (ref 6.5–8.1)

## 2020-09-23 LAB — POC OCCULT BLOOD, ED: Fecal Occult Bld: POSITIVE — AB

## 2020-09-23 LAB — MAGNESIUM: Magnesium: 1.8 mg/dL (ref 1.7–2.4)

## 2020-09-23 LAB — CBC
HCT: 45.3 % (ref 36.0–46.0)
Hemoglobin: 13.7 g/dL (ref 12.0–15.0)
MCH: 21.9 pg — ABNORMAL LOW (ref 26.0–34.0)
MCHC: 30.2 g/dL (ref 30.0–36.0)
MCV: 72.5 fL — ABNORMAL LOW (ref 80.0–100.0)
Platelets: 285 K/uL (ref 150–400)
RBC: 6.25 MIL/uL — ABNORMAL HIGH (ref 3.87–5.11)
RDW: 19.7 % — ABNORMAL HIGH (ref 11.5–15.5)
WBC: 8.2 K/uL (ref 4.0–10.5)
nRBC: 0 % (ref 0.0–0.2)

## 2020-09-23 LAB — URINALYSIS, ROUTINE W REFLEX MICROSCOPIC
Bilirubin Urine: NEGATIVE
Glucose, UA: >=500 mg/dL — AB
Hgb urine dipstick: NEGATIVE
Ketones, ur: NEGATIVE mg/dL
Leukocytes,Ua: NEGATIVE
Nitrite: NEGATIVE
Protein, ur: NEGATIVE mg/dL
Specific Gravity, Urine: 1.028 (ref 1.005–1.030)
pH: 5 (ref 5.0–8.0)

## 2020-09-23 LAB — HIV ANTIBODY (ROUTINE TESTING W REFLEX): HIV Screen 4th Generation wRfx: NONREACTIVE

## 2020-09-23 LAB — HEMOGLOBIN A1C
Hgb A1c MFr Bld: 8.4 % — ABNORMAL HIGH (ref 4.8–5.6)
Mean Plasma Glucose: 194.38 mg/dL

## 2020-09-23 LAB — RESPIRATORY PANEL BY RT PCR (FLU A&B, COVID)
Influenza A by PCR: NEGATIVE
Influenza B by PCR: NEGATIVE
SARS Coronavirus 2 by RT PCR: NEGATIVE

## 2020-09-23 LAB — LIPASE, BLOOD: Lipase: 29 U/L (ref 11–51)

## 2020-09-23 LAB — HEMOGLOBIN
Hemoglobin: 13.3 g/dL (ref 12.0–15.0)
Hemoglobin: 12.8 g/dL (ref 12.0–15.0)

## 2020-09-23 LAB — GLUCOSE, CAPILLARY
Glucose-Capillary: 78 mg/dL (ref 70–99)
Glucose-Capillary: 117 mg/dL — ABNORMAL HIGH (ref 70–99)

## 2020-09-23 MED ORDER — IRBESARTAN 300 MG PO TABS
300.0000 mg | ORAL_TABLET | Freq: Every day | ORAL | Status: DC
  Administered 2020-09-23 – 2020-09-24 (×2): 300 mg via ORAL
  Filled 2020-09-23 (×3): qty 1

## 2020-09-23 MED ORDER — CARVEDILOL 12.5 MG PO TABS
12.5000 mg | ORAL_TABLET | Freq: Two times a day (BID) | ORAL | Status: DC
  Administered 2020-09-23 – 2020-09-25 (×4): 12.5 mg via ORAL
  Filled 2020-09-23 (×4): qty 1

## 2020-09-23 MED ORDER — POTASSIUM CHLORIDE CRYS ER 20 MEQ PO TBCR
20.0000 meq | EXTENDED_RELEASE_TABLET | Freq: Every day | ORAL | Status: AC
  Administered 2020-09-23 – 2020-09-24 (×2): 20 meq via ORAL
  Filled 2020-09-23 (×2): qty 1

## 2020-09-23 MED ORDER — ONDANSETRON HCL 4 MG PO TABS: 4.0000 mg | ORAL_TABLET | Freq: Four times a day (QID) | ORAL | Status: DC | PRN

## 2020-09-23 MED ORDER — INSULIN GLARGINE 100 UNIT/ML ~~LOC~~ SOLN
30.0000 [IU] | Freq: Every day | SUBCUTANEOUS | Status: DC
  Administered 2020-09-23 – 2020-09-24 (×2): 30 [IU] via SUBCUTANEOUS
  Filled 2020-09-23 (×2): qty 0.3

## 2020-09-23 MED ORDER — ACETAMINOPHEN 325 MG PO TABS: 650.0000 mg | ORAL_TABLET | Freq: Four times a day (QID) | ORAL | Status: DC | PRN

## 2020-09-23 MED ORDER — ATORVASTATIN CALCIUM 10 MG PO TABS
10.0000 mg | ORAL_TABLET | Freq: Every day | ORAL | Status: DC
  Administered 2020-09-24 – 2020-09-25 (×2): 10 mg via ORAL
  Filled 2020-09-23 (×2): qty 1

## 2020-09-23 MED ORDER — ONDANSETRON HCL 4 MG/2ML IJ SOLN: 4.0000 mg | Freq: Four times a day (QID) | INTRAMUSCULAR | Status: DC | PRN

## 2020-09-23 MED ORDER — INSULIN ASPART 100 UNIT/ML ~~LOC~~ SOLN
0.0000 [IU] | Freq: Three times a day (TID) | SUBCUTANEOUS | Status: DC
  Administered 2020-09-24 (×2): 2 [IU] via SUBCUTANEOUS
  Administered 2020-09-25: 3 [IU] via SUBCUTANEOUS

## 2020-09-23 NOTE — Progress Notes (Signed)
Patient arrives to room 1525 at this time via wheelchair from ED.  Patient independent to chair.  Patient educated on callbell use and bed controls with stated understanding

## 2020-09-23 NOTE — ED Triage Notes (Signed)
Patient reports to the ER for abdominal cramping and GI bleed. Patient reports she has seen both dark and bright red blood in her stool. Reports a hx of diverticulitis and multiple bleeding sites in the past.

## 2020-09-23 NOTE — Progress Notes (Signed)
Pt reports no bloody stool since this a.m. Will continue to monitor

## 2020-09-23 NOTE — H&P (Addendum)
History and Physical    KUULEI KLEIER Mueller:[id number] DOB: Jul 30, 1961 DOA: 09/23/2020  PCP: [name], DO  Patient coming from: Home  Chief Complaint: rectal bleeding.   HPI: Carol Mueller is a 59 y.o. female with medical history significant of recurrent diverticular bleed, DM, HTN. She states that this morning around 7:30, she started feeling some crampy abdominal pain and nausea. It was sudden onset. There was no vomiting, but she felt that she needed to have a BM. When she went to the bathroom, she saw bloody stool. This has happened to her several times before. So she recognized the possibility of a diverticular bleed and came directly to the ED. There are no other alleviating or aggravating factors. She did not try any treatments.    ED Course: She was found to be hemoccult positive. EDP discussed case with [facility]. Recommended medical admission w/ GI consult.   Review of Systems:  Denies CP, palpitations, dyspnea, hematemesis, HA, syncopal episodes. Review of systems is otherwise negative for all not mentioned in HPI.   PMHx Past Medical History:  Diagnosis Date  . Anemia   . Cushing's syndrome ([place])    due to adrenal tumor  . Diabetes mellitus without complication (HCC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  . Diverticulitis   . Diverticulosis   . GI bleed   . Hyperlipidemia   . Hypertension   . Polycystic ovary disease   . Sepsis ([place]) 05/2019    PSHx Past Surgical History:  Procedure Laterality Date  . ADRENALECTOMY  1996  . BREAST BIOPSY    . CHOLECYSTECTOMY    . COLONOSCOPY WITH PROPOFOL N/A 10/03/2019   Procedure: COLONOSCOPY WITH PROPOFOL;  Surgeon: Ladene Artist, MD;  Location: [facility];  Service: Endoscopy;  Laterality: N/A;  . ESOPHAGOGASTRODUODENOSCOPY (EGD) WITH PROPOFOL N/A 07/16/2015   Procedure: ESOPHAGOGASTRODUODENOSCOPY (EGD) WITH PROPOFOL;  Surgeon: [name], MD;  Location: [facility];  Service: Endoscopy;  Laterality: N/A;  . POLYPECTOMY   10/03/2019   Procedure: POLYPECTOMY;  Surgeon: Ladene Artist, MD;  Location: [facility];  Service: Endoscopy;;    SocHx  reports that she has never smoked. She has never used smokeless tobacco. She reports that she does not drink alcohol and does not use drugs.  Allergies  Allergen Reactions  . Norvasc [Amlodipine Besylate] Swelling    ANGIOEDEMA  . Nsaids Other (See Comments)    GI upset/ GI bleed    FamHx Family History  Problem Relation Age of Onset  . Hypertension Mother   . Cancer Mother        ?uterine or ovarian cancer  . Hypertension Father   . Diabetes Father   . Hypertension Sister   . Diabetes Sister   . Hypertension Brother   . Diabetes Brother   . Hypertension Maternal Grandfather   . Diabetes Paternal Grandmother   . Breast cancer Maternal Grandmother   . Colon cancer Neg Hx     Prior to Admission medications   Medication Sig Start Date End Date Taking? Authorizing Provider  acetaminophen (TYLENOL) 325 MG tablet Take 2 tablets (650 mg total) by mouth every 6 (six) hours as needed for mild pain (or Fever >/= 101). 09/19/19  Yes [name] I, MD  atorvastatin (LIPITOR) 10 MG tablet Take 10 mg by mouth daily. 03/06/19  Yes [provider]  calcium citrate-vitamin D (CITRACAL+D) 315-200 MG-UNIT tablet Take 1 tablet by mouth daily.   Yes [provider]  carvedilol (COREG) 12.5 MG tablet  Take 12.5 mg by mouth 2 (two) times daily with a meal.    Yes [provider]  dicyclomine (BENTYL) 20 MG tablet TAKE 1 TABLET BY MOUTH TWICE A DAY Patient taking differently: Take 20 mg by mouth as needed (Stomach pain).  06/02/17  Yes [name], MD  empagliflozin (JARDIANCE) 10 MG TABS tablet Take 10 mg by mouth daily.   Yes [provider]  glimepiride (AMARYL) 2 MG tablet Take 2 mg by mouth daily. 02/23/20  Yes [provider]  Insulin Glargine (BASAGLAR KWIKPEN) 100 UNIT/ML Inject 40 Units into the skin at bedtime.   02/20/20  Yes [provider]  metFORMIN (GLUCOPHAGE) 1000 MG tablet Take 1,000 mg by mouth 2 (two) times daily.  01/11/20  Yes [provider]  Multiple Vitamins-Minerals (MULTIVITAMIN WITH MINERALS) tablet Take 1 tablet by mouth daily.   Yes [provider]  olmesartan (BENICAR) 40 MG tablet Take 40 mg by mouth daily. 10/11/19  Yes [provider]    Physical Exam: Vitals:   09/23/20 0925 09/23/20 1030 09/23/20 1100  BP: (!) 197/86 (!) 213/111 (!) 212/111  Pulse: 85 82 80  Resp: 18 16 18   Temp: 98.4 F (36.9 C)    TempSrc: Oral    SpO2: 96% 97% 99%  Weight: 111.1 kg    Height: 5\' 3"  (1.6 m)      General: 59 y.o. female resting in bed in NAD Eyes: PERRL, normal sclera ENMT: Nares patent w/o discharge, orophaynx clear, dentition normal, ears w/o discharge/lesions/ulcers Neck: Supple, trachea midline Cardiovascular: RRR, +S1, S2, no m/g/r, equal pulses throughout Respiratory: CTABL, no w/r/r, normal WOB GI: BS+, NDNT, no masses noted, no organomegaly noted MSK: No e/c/c Skin: No rashes, bruises, ulcerations noted Neuro: A&O x 3, no focal deficits Psyc: Appropriate interaction and affect, calm/cooperative  Labs on Admission: I have personally reviewed following labs and imaging studies  CBC: Recent Labs  Lab 09/23/20 0949  WBC 8.2  HGB 13.7  HCT 45.3  MCV 72.5*  PLT 937   Basic Metabolic Panel: Recent Labs  Lab 09/23/20 0949  NA 140  K 3.2*  CL 105  CO2 28  GLUCOSE 119*  BUN 13  CREATININE 0.81  CALCIUM 9.0   GFR: Estimated Creatinine Clearance: 88.5 mL/min (by C-G formula based on SCr of 0.81 mg/dL). Liver Function Tests: Recent Labs  Lab 09/23/20 0949  AST 16  ALT 18  ALKPHOS 48  BILITOT 0.6  PROT 7.6  ALBUMIN 4.0   Recent Labs  Lab 09/23/20 0949  LIPASE 29   No results for input(s): AMMONIA in the last 168 hours. Coagulation Profile: No results for input(s): INR, PROTIME in the last 168 hours. Cardiac  Enzymes: No results for input(s): CKTOTAL, CKMB, CKMBINDEX, TROPONINI in the last 168 hours. BNP (last 3 results) No results for input(s): PROBNP in the last 8760 hours. HbA1C: No results for input(s): HGBA1C in the last 72 hours. CBG: No results for input(s): GLUCAP in the last 168 hours. Lipid Profile: No results for input(s): CHOL, HDL, LDLCALC, TRIG, CHOLHDL, LDLDIRECT in the last 72 hours. Thyroid Function Tests: No results for input(s): TSH, T4TOTAL, FREET4, T3FREE, THYROIDAB in the last 72 hours. Anemia Panel: No results for input(s): VITAMINB12, FOLATE, FERRITIN, TIBC, IRON, RETICCTPCT in the last 72 hours. Urine analysis:    Component Value Date/Time   COLORURINE YELLOW 09/23/2020 [place] 09/23/2020 0954   LABSPEC 1.028 09/23/2020 0954   PHURINE 5.0 09/23/2020 0954  GLUCOSEU >=500 (A) 09/23/2020 0954   HGBUR NEGATIVE 09/23/2020 [place] 09/23/2020 0954   BILIRUBINUR neg 08/25/2014 1157   [place] 09/23/2020 0954   PROTEINUR NEGATIVE 09/23/2020 0954   UROBILINOGEN 0.2 08/25/2014 1157   NITRITE NEGATIVE 09/23/2020 [place] 09/23/2020 0954    Radiological Exams on Admission: No results found.  Assessment/Plan GIB     - admit to obs, med-surg     - q8h H&H, CLD for now     - [facility] to follow  HTN     - continue home benicar, coreg  HLD     - continue home lipitor  DM2     - SSI, basaglar, glucose checks A1c  Hypokalemia     - replace K+, check Mg2+  Microcytosis     - check iron levels  DVT prophylaxis: SCDs  Code Status: FULL  Family Communication: None at bedside  Consults called: EDP called [facility]  Status is: Observation  The patient remains OBS appropriate and will d/c before 2 midnights.  Dispo: The patient is from: Home              Anticipated d/c is to: Home              Anticipated d/c date is: 1 day              Patient currently is not medically stable to d/c.  Jonnie Finner DO [facility]  If 7PM-7AM, please contact night-coverage [email or web address]  09/23/2020, 11:41 AM

## 2020-09-23 NOTE — Consult Note (Signed)
Consultation  Referring Provider: Dr. Karle Starch      Primary Care Physician:  [name], DO Primary Gastroenterologist: Dr. [name]        Reason for Consultation: GI Bleed             HPI:   Carol Mueller is a 59 y.o. female with a history of Cushing's syndrome, diabetes and recurrent diverticular bleeding presenting to the ER today with a complaint of mild to moderate abdominal cramping this morning followed by large bloody bowel movement which was dark red.    Today, the patient explains that she has been doing well trying to maintain soft regular bowel movements but for the past 3 days she had looser than normal stools and this morning a bowel movement with a large amount of dark red blood.  Tells me she has been under a lot of stress recently with the burying of her father this past Wednesday and then 2 other family members passed away before the weekend.  She is asking if this could cause bleeds.  Denies any other changes in her GI system.    Denies fever, chills, weight loss, heartburn, reflux, nausea or vomiting.    ER course: Hemoglobin 13.7, BUN normal at 13  GI history: 10/30/2019 office visit with Dr. [name]: At that time discussed that she had been hospitalized twice in November for 3-day admissions for lower GI bleeding, they are presumed to be acute diverticular bleeds, required 1 unit of blood total throughout those 2 admissions, has received 3 IV iron infusions since then, cannot take oral iron as it upset her stomach, she had not had any bleeding since leaving the hospital; it was discussed that she had a CT angio in 2017 suggest that this is coming from her proximal descending colon, does recommend that she have CT angiogram if she ever presented to the hospital again to try to decipher where this is coming from initiated benefit from segmental or left hemicolectomy  10/03/2019 colonoscopy Dr. Fuller Plan - Two 6 to 8 mm polyps in the descending colon and in the transverse  colon, removed with a cold snare. Resected and retrieved. - Mild diverticulosis in the right colon. There was no evidence of diverticular bleeding. - Moderate diverticulosis in the left colon. There was no evidence of diverticular bleeding. - Internal hemorrhoids. - The examination was otherwise normal on direct and retroflexion views  07/16/2015 EGD Dr. [name] Normal appearing esophagus and GE junction, the stomach was well visualized and normal in appearance, normal appearing duodenum. Given overall presentation, testing it seems most likely that her bleeding and abd pain yesterday were from mild resolved ischemia or infection.  Past Medical History:  Diagnosis Date  . Anemia   . Cushing's syndrome ([place])    due to adrenal tumor  . Diabetes mellitus without complication (HCC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  . Diverticulitis   . Diverticulosis   . GI bleed   . Hyperlipidemia   . Hypertension   . Polycystic ovary disease   . Sepsis ([place]) 05/2019    Past Surgical History:  Procedure Laterality Date  . ADRENALECTOMY  1996  . BREAST BIOPSY    . CHOLECYSTECTOMY    . COLONOSCOPY WITH PROPOFOL N/A 10/03/2019   Procedure: COLONOSCOPY WITH PROPOFOL;  Surgeon: Ladene Artist, MD;  Location: [facility];  Service: Endoscopy;  Laterality: N/A;  . ESOPHAGOGASTRODUODENOSCOPY (EGD) WITH PROPOFOL N/A 07/16/2015   Procedure: ESOPHAGOGASTRODUODENOSCOPY (EGD) WITH PROPOFOL;  Surgeon: [name],  MD;  Location: [facility];  Service: Endoscopy;  Laterality: N/A;  . POLYPECTOMY  10/03/2019   Procedure: POLYPECTOMY;  Surgeon: Ladene Artist, MD;  Location: [facility];  Service: Endoscopy;;    Family History  Problem Relation Age of Onset  . Hypertension Mother   . Cancer Mother        ?uterine or ovarian cancer  . Hypertension Father   . Diabetes Father   . Hypertension Sister   . Diabetes Sister   . Hypertension Brother   . Diabetes Brother   . Hypertension Maternal Grandfather   .  Diabetes Paternal Grandmother   . Breast cancer Maternal Grandmother   . Colon cancer Neg Hx      Social History   Tobacco Use  . Smoking status: Never Smoker  . Smokeless tobacco: Never Used  Vaping Use  . Vaping Use: Never used  Substance Use Topics  . Alcohol use: No  . Drug use: No    Prior to Admission medications   Medication Sig Start Date End Date Taking? Authorizing Provider  acetaminophen (TYLENOL) 325 MG tablet Take 2 tablets (650 mg total) by mouth every 6 (six) hours as needed for mild pain (or Fever >/= 101). 09/19/19  Yes [name] I, MD  atorvastatin (LIPITOR) 10 MG tablet Take 10 mg by mouth daily. 03/06/19  Yes [provider]  calcium citrate-vitamin D (CITRACAL+D) 315-200 MG-UNIT tablet Take 1 tablet by mouth daily.   Yes [provider]  carvedilol (COREG) 12.5 MG tablet Take 12.5 mg by mouth 2 (two) times daily with a meal.    Yes [provider]  dicyclomine (BENTYL) 20 MG tablet TAKE 1 TABLET BY MOUTH TWICE A DAY Patient taking differently: Take 20 mg by mouth as needed (Stomach pain).  06/02/17  Yes [name], MD  empagliflozin (JARDIANCE) 10 MG TABS tablet Take 10 mg by mouth daily.   Yes [provider]  glimepiride (AMARYL) 2 MG tablet Take 2 mg by mouth daily. 02/23/20  Yes [provider]  Insulin Glargine (BASAGLAR KWIKPEN) 100 UNIT/ML Inject 40 Units into the skin at bedtime.  02/20/20  Yes [provider]  metFORMIN (GLUCOPHAGE) 1000 MG tablet Take 1,000 mg by mouth 2 (two) times daily.  01/11/20  Yes [provider]  Multiple Vitamins-Minerals (MULTIVITAMIN WITH MINERALS) tablet Take 1 tablet by mouth daily.   Yes [provider]  olmesartan (BENICAR) 40 MG tablet Take 40 mg by mouth daily. 10/11/19  Yes [provider]    Current Facility-Administered Medications  Medication Dose Route Frequency Provider Last Rate Last Admin  . carvedilol (COREG) tablet 12.5 mg   12.5 mg Oral BID WC Truddie Hidden, MD   12.5 mg at 09/23/20 1121  . ferrous sulfate tablet 325 mg  325 mg Oral Q breakfast [name] I, MD      . irbesartan Levy Sjogren) tablet 300 mg  300 mg Oral Daily Truddie Hidden, MD   300 mg at 09/23/20 1121   Current Outpatient Medications  Medication Sig Dispense Refill  . acetaminophen (TYLENOL) 325 MG tablet Take 2 tablets (650 mg total) by mouth every 6 (six) hours as needed for mild pain (or Fever >/= 101). 40 tablet 0  . atorvastatin (LIPITOR) 10 MG tablet Take 10 mg by mouth daily.    . calcium citrate-vitamin D (CITRACAL+D) 315-200 MG-UNIT tablet Take 1 tablet by mouth daily.    . carvedilol (COREG) 12.5 MG tablet Take 12.5  mg by mouth 2 (two) times daily with a meal.     . dicyclomine (BENTYL) 20 MG tablet TAKE 1 TABLET BY MOUTH TWICE A DAY (Patient taking differently: Take 20 mg by mouth as needed (Stomach pain). ) 60 tablet 0  . empagliflozin (JARDIANCE) 10 MG TABS tablet Take 10 mg by mouth daily.    Marland Kitchen glimepiride (AMARYL) 2 MG tablet Take 2 mg by mouth daily.    . Insulin Glargine (BASAGLAR KWIKPEN) 100 UNIT/ML Inject 40 Units into the skin at bedtime.     . metFORMIN (GLUCOPHAGE) 1000 MG tablet Take 1,000 mg by mouth 2 (two) times daily.     . Multiple Vitamins-Minerals (MULTIVITAMIN WITH MINERALS) tablet Take 1 tablet by mouth daily.    Marland Kitchen olmesartan (BENICAR) 40 MG tablet Take 40 mg by mouth daily.      Allergies as of 09/23/2020 - Review Complete 09/23/2020  Allergen Reaction Noted  . Norvasc [amlodipine besylate] Swelling 02/22/2012  . Nsaids Other (See Comments) 08/07/2015     Review of Systems:    Constitutional: No weight loss, fever or chills Skin: No rash  Cardiovascular: No chest pain Respiratory: No SOB  Gastrointestinal: See HPI and otherwise negative Genitourinary: No dysuria  Neurological: No headache, dizziness or syncope Musculoskeletal: No new muscle or joint pain Hematologic: No  bruising Psychiatric: No history of depression or anxiety    Physical Exam:  Vital signs in last 24 hours: Temp:  [98.4 F (36.9 C)] 98.4 F (36.9 C) (11/08 0925) Pulse Rate:  [80-85] 80 (11/08 1100) Resp:  [16-18] 18 (11/08 1100) BP: (197-213)/(86-111) 212/111 (11/08 1100) SpO2:  [96 %-99 %] 99 % (11/08 1100) Weight:  [111.1 kg] 111.1 kg (11/08 0925)   General:   Pleasant obese African-American female appears to be in NAD, Well developed, Well nourished, alert and cooperative Head:  Normocephalic and atraumatic. Eyes:   PEERL, EOMI. No icterus. Conjunctiva pink. Ears:  Normal auditory acuity. Neck:  Supple Throat: Oral cavity and pharynx without inflammation, swelling or lesion.  Lungs: Respirations even and unlabored. Lungs clear to auscultation bilaterally.   No wheezes, crackles, or rhonchi.  Heart: Normal S1, S2. No MRG. Regular rate and rhythm. No peripheral edema, cyanosis or pallor.  Abdomen:  Soft, nondistended, nontender. No rebound or guarding. Normal bowel sounds. No appreciable masses or hepatomegaly. Rectal:  Not performed.  Hemoccult positive per ER physician Msk:  Symmetrical without gross deformities. Peripheral pulses intact.  Extremities:  Without edema, no deformity or joint abnormality.  Neurologic:  Alert and  oriented x4;  grossly normal neurologically.  Skin:   Dry and intact without significant lesions or rashes. Psychiatric: Demonstrates good judgement and reason without abnormal affect or behaviors.   LAB RESULTS: Recent Labs    09/23/20 0949  WBC 8.2  HGB 13.7  HCT 45.3  PLT 285   BMET Recent Labs    09/23/20 0949  NA 140  K 3.2*  CL 105  CO2 28  GLUCOSE 119*  BUN 13  CREATININE 0.81  CALCIUM 9.0   LFT Recent Labs    09/23/20 0949  PROT 7.6  ALBUMIN 4.0  AST 16  ALT 18  ALKPHOS 48  BILITOT 0.6    Impression / Plan:   Impression: 1.  GI bleed: History of diverticular bleeds in the past, last November was the last admission  for 1, last colonoscopy at that time, CTA in the past showing proximal descending colon as the source; most likely diverticular 2.  Hypokalemia: We will leave to the hospitalist team to correct  Plan: 1.  Continue to monitor hemoglobin every 8 hours with transfusion as needed less than 7 2.  If patient starts to briskly bleed then would recommend an emergent CTA so that we can try and decipher where this bleeding is coming from 3.  There are no plans for colonoscopy at this point unless something changes over the next 24 hours 4.  Patient can be on a clear liquid diet for now 5.  Please await any further recommendations from Dr. Tarri Glenn later today.  Thank you for your kind consultation, we will continue to follow.  Lavone Nian [facility]  09/23/2020, 11:34 AM

## 2020-09-23 NOTE — ED Provider Notes (Signed)
[facility] EMERGENCY DEPARTMENT Provider Note  CSN: [id number] Arrival date & time: 09/23/20 0920    History Chief Complaint  Patient presents with  . GI Bleeding  . Abdominal Cramping    HPI  Carol Mueller is a 59 y.o. female with history of recurrent diverticular bleeding reports she was in her usual state of health recently, but had some mild-moderate abdominal cramping this morning followed by a large bloody bowel movement, dark red but not maroon or black. She has not taken her morning BP meds and noted elevated blood pressures in triage. Denies any chest pain, SOB, nausea, vomiting, lightheaded or dizziness.    Past Medical History:  Diagnosis Date  . Anemia   . Cushing's syndrome ([place])    due to adrenal tumor  . Diabetes mellitus without complication (HCC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  . Diverticulitis   . Diverticulosis   . GI bleed   . Hyperlipidemia   . Hypertension   . Polycystic ovary disease   . Sepsis ([place]) 05/2019    Past Surgical History:  Procedure Laterality Date  . ADRENALECTOMY  1996  . BREAST BIOPSY    . CHOLECYSTECTOMY    . COLONOSCOPY WITH PROPOFOL N/A 10/03/2019   Procedure: COLONOSCOPY WITH PROPOFOL;  Surgeon: Ladene Artist, MD;  Location: [facility];  Service: Endoscopy;  Laterality: N/A;  . ESOPHAGOGASTRODUODENOSCOPY (EGD) WITH PROPOFOL N/A 07/16/2015   Procedure: ESOPHAGOGASTRODUODENOSCOPY (EGD) WITH PROPOFOL;  Surgeon: [name], MD;  Location: [facility];  Service: Endoscopy;  Laterality: N/A;  . POLYPECTOMY  10/03/2019   Procedure: POLYPECTOMY;  Surgeon: Ladene Artist, MD;  Location: [facility];  Service: Endoscopy;;    Family History  Problem Relation Age of Onset  . Hypertension Mother   . Cancer Mother        ?uterine or ovarian cancer  . Hypertension Father   . Diabetes Father   . Hypertension Sister   . Diabetes Sister   . Hypertension Brother   . Diabetes Brother   . Hypertension Maternal Grandfather   .  Diabetes Paternal Grandmother   . Breast cancer Maternal Grandmother   . Colon cancer Neg Hx     Social History   Tobacco Use  . Smoking status: Never Smoker  . Smokeless tobacco: Never Used  Vaping Use  . Vaping Use: Never used  Substance Use Topics  . Alcohol use: No  . Drug use: No     Home Medications Prior to Admission medications   Medication Sig Start Date End Date Taking? Authorizing Provider  acetaminophen (TYLENOL) 325 MG tablet Take 2 tablets (650 mg total) by mouth every 6 (six) hours as needed for mild pain (or Fever >/= 101). 09/19/19  Yes [name] I, MD  atorvastatin (LIPITOR) 10 MG tablet Take 10 mg by mouth daily. 03/06/19  Yes [provider]  calcium citrate-vitamin D (CITRACAL+D) 315-200 MG-UNIT tablet Take 1 tablet by mouth daily.   Yes [provider]  carvedilol (COREG) 12.5 MG tablet Take 12.5 mg by mouth 2 (two) times daily with a meal.    Yes [provider]  dicyclomine (BENTYL) 20 MG tablet TAKE 1 TABLET BY MOUTH TWICE A DAY Patient taking differently: Take 20 mg by mouth as needed (Stomach pain).  06/02/17  Yes [name], MD  empagliflozin (JARDIANCE) 10 MG TABS tablet Take 10 mg by mouth daily.   Yes [provider]  glimepiride (AMARYL) 2 MG tablet Take 2 mg by mouth  daily. 02/23/20  Yes [provider]  Insulin Glargine (BASAGLAR KWIKPEN) 100 UNIT/ML Inject 40 Units into the skin at bedtime.  02/20/20  Yes [provider]  metFORMIN (GLUCOPHAGE) 1000 MG tablet Take 1,000 mg by mouth 2 (two) times daily.  01/11/20  Yes [provider]  Multiple Vitamins-Minerals (MULTIVITAMIN WITH MINERALS) tablet Take 1 tablet by mouth daily.   Yes [provider]  olmesartan (BENICAR) 40 MG tablet Take 40 mg by mouth daily. 10/11/19  Yes [provider]     Allergies    Norvasc [amlodipine besylate] and Nsaids   Review of Systems   Review of Systems A comprehensive review  of systems was completed and negative except as noted in HPI.    Physical Exam BP (!) 212/111   Pulse 80   Temp 98.4 F (36.9 C) (Oral)   Resp 18   Ht 5\' 3"  (1.6 m)   Wt 111.1 kg   SpO2 99%   BMI 43.40 kg/m   Physical Exam Vitals and nursing note reviewed.  Constitutional:      Appearance: Normal appearance.  HENT:     Head: Normocephalic and atraumatic.     Nose: Nose normal.     Mouth/Throat:     Mouth: Mucous membranes are moist.  Eyes:     Extraocular Movements: Extraocular movements intact.     Conjunctiva/sclera: Conjunctivae normal.  Cardiovascular:     Rate and Rhythm: Normal rate.  Pulmonary:     Effort: Pulmonary effort is normal.     Breath sounds: Normal breath sounds.  Abdominal:     General: Abdomen is flat.     Palpations: Abdomen is soft.     Tenderness: There is no abdominal tenderness.  Genitourinary:    Comments: Rectal: chaperone present, small external hemorrhoid non thrombosed and not bleeding, dark red bloody stool in rectum.  Musculoskeletal:        General: No swelling. Normal range of motion.     Cervical back: Neck supple.  Skin:    General: Skin is warm and dry.  Neurological:     General: No focal deficit present.     Mental Status: She is alert.  Psychiatric:        Mood and Affect: Mood normal.      ED Results / Procedures / Treatments   Labs (all labs ordered are listed, but only abnormal results are displayed) Labs Reviewed  COMPREHENSIVE METABOLIC PANEL - Abnormal; Notable for the following components:      Result Value   Potassium 3.2 (*)    Glucose, Bld 119 (*)    All other components within normal limits  CBC - Abnormal; Notable for the following components:   RBC 6.25 (*)    MCV 72.5 (*)    MCH 21.9 (*)    RDW 19.7 (*)    All other components within normal limits  URINALYSIS, ROUTINE W REFLEX MICROSCOPIC - Abnormal; Notable for the following components:   Glucose, UA >=500 (*)    Bacteria, UA RARE (*)    All  other components within normal limits  POC OCCULT BLOOD, ED - Abnormal; Notable for the following components:   Fecal Occult Bld POSITIVE (*)    All other components within normal limits  RESPIRATORY PANEL BY RT PCR (FLU A&B, COVID)  LIPASE, BLOOD    EKG None  Radiology No results found.  Procedures Procedures  Medications Ordered in the ED Medications  carvedilol (COREG) tablet 12.5 mg (12.5  mg Oral Given 09/23/20 1121)  irbesartan (AVAPRO) tablet 300 mg (300 mg Oral Given 09/23/20 1121)     MDM Rules/Calculators/A&P MDM Patient with recurrent diverticular bleeding here with recurrence of same. She has elevated BP, has not had her morning meds. Will give those now and watch closely. Initial labs are unremarkable, no significant anemia. Will discuss with GI on call.  ED Course  I have reviewed the triage vital signs and the nursing notes.  Pertinent labs & imaging results that were available during my care of the patient were reviewed by me and considered in my medical decision making (see chart for details).  Clinical Course as of Sep 23 1202  Mon Sep 23, 2020  1124 Spoke with Earnie Larsson, PA with GI who will consult. Hospitalist paged.    [CS]  1131 Spoke with Dr. Marylyn Ishihara, Hospitalist, who will evaluate for admission.    [CS]    Clinical Course User Index [CS] Truddie Hidden, MD    Final Clinical Impression(s) / ED Diagnoses Final diagnoses:  Lower GI bleeding  Hypertension, unspecified type    Rx / DC Orders ED Discharge Orders    None       Truddie Hidden, MD 09/23/20 1203

## 2020-09-24 DIAGNOSIS — K922 Gastrointestinal hemorrhage, unspecified: Secondary | ICD-10-CM | POA: Diagnosis not present

## 2020-09-24 DIAGNOSIS — I10 Essential (primary) hypertension: Secondary | ICD-10-CM | POA: Diagnosis not present

## 2020-09-24 LAB — COMPREHENSIVE METABOLIC PANEL
ALT: 19 U/L (ref 0–44)
ALT: 15 U/L (ref 0–44)
AST: 19 U/L (ref 15–41)
AST: 19 U/L (ref 15–41)
Albumin: 4.1 g/dL (ref 3.5–5.0)
Albumin: 3.7 g/dL (ref 3.5–5.0)
Alkaline Phosphatase: 46 U/L (ref 38–126)
Alkaline Phosphatase: 51 U/L (ref 38–126)
Anion gap: 9 (ref 5–15)
Anion gap: 4 — ABNORMAL LOW (ref 5–15)
BUN: 9 mg/dL (ref 6–20)
BUN: 7 mg/dL (ref 6–20)
CO2: 27 mmol/L (ref 22–32)
CO2: 31 mmol/L (ref 22–32)
Calcium: 8.8 mg/dL — ABNORMAL LOW (ref 8.9–10.3)
Calcium: 9 mg/dL (ref 8.9–10.3)
Chloride: 103 mmol/L (ref 98–111)
Chloride: 104 mmol/L (ref 98–111)
Creatinine, Ser: 0.68 mg/dL (ref 0.44–1.00)
Creatinine, Ser: 0.77 mg/dL (ref 0.44–1.00)
GFR, Estimated: >60 mL/min (ref >60)
GFR, Estimated: >60 mL/min (ref >60)
Glucose, Bld: 109 mg/dL — ABNORMAL HIGH (ref 70–99)
Glucose, Bld: 123 mg/dL — ABNORMAL HIGH (ref 70–99)
Potassium: 3.2 mmol/L — ABNORMAL LOW (ref 3.5–5.1)
Potassium: 3.6 mmol/L (ref 3.5–5.1)
Sodium: 139 mmol/L (ref 135–145)
Sodium: 139 mmol/L (ref 135–145)
Total Bilirubin: 0.8 mg/dL (ref 0.3–1.2)
Total Bilirubin: 0.9 mg/dL (ref 0.3–1.2)
Total Protein: 7.4 g/dL (ref 6.5–8.1)
Total Protein: 7.4 g/dL (ref 6.5–8.1)

## 2020-09-24 LAB — CBC
HCT: 45.1 % (ref 36.0–46.0)
HCT: 45.1 % (ref 36.0–46.0)
Hemoglobin: 13.6 g/dL (ref 12.0–15.0)
Hemoglobin: 13.7 g/dL (ref 12.0–15.0)
MCH: 21.7 pg — ABNORMAL LOW (ref 26.0–34.0)
MCH: 22 pg — ABNORMAL LOW (ref 26.0–34.0)
MCHC: 30.2 g/dL (ref 30.0–36.0)
MCHC: 30.4 g/dL (ref 30.0–36.0)
MCV: 72 fL — ABNORMAL LOW (ref 80.0–100.0)
MCV: 72.5 fL — ABNORMAL LOW (ref 80.0–100.0)
Platelets: 283 10*3/uL (ref 150–400)
Platelets: 294 10*3/uL (ref 150–400)
RBC: 6.26 MIL/uL — ABNORMAL HIGH (ref 3.87–5.11)
RBC: 6.22 MIL/uL — ABNORMAL HIGH (ref 3.87–5.11)
RDW: 19.8 % — ABNORMAL HIGH (ref 11.5–15.5)
RDW: 19.6 % — ABNORMAL HIGH (ref 11.5–15.5)
WBC: 8.6 10*3/uL (ref 4.0–10.5)
WBC: 7.7 10*3/uL (ref 4.0–10.5)
nRBC: 0 % (ref 0.0–0.2)
nRBC: 0 % (ref 0.0–0.2)

## 2020-09-24 LAB — GLUCOSE, CAPILLARY
Glucose-Capillary: 107 mg/dL — ABNORMAL HIGH (ref 70–99)
Glucose-Capillary: 122 mg/dL — ABNORMAL HIGH (ref 70–99)
Glucose-Capillary: 133 mg/dL — ABNORMAL HIGH (ref 70–99)
Glucose-Capillary: 239 mg/dL — ABNORMAL HIGH (ref 70–99)
Glucose-Capillary: 212 mg/dL — ABNORMAL HIGH (ref 70–99)

## 2020-09-24 LAB — HEMOGLOBIN: Hemoglobin: 13.6 g/dL (ref 12.0–15.0)

## 2020-09-24 NOTE — Progress Notes (Addendum)
Carol Mueller  RXV:[id number] DOB: 02-06-1961 DOA: 09/23/2020 PCP: [name], DO    Brief Narrative:  59 year old with a history of recurrent diverticular bleeding, DM2, and HTN who reported the acute onset of crampy abdominal pain and nausea the date of her admission followed by a large bloody stool.  This was consistent with her prior episodes of diverticular bleeding.   Significant Events:  11/8 admit via ED  Antimicrobials:  None  DVT prophylaxis: SCDs  Subjective: Blood pressure elevated.  Other vital signs stable.  Hemoglobin stable with patient not anemic at present.  Patient states she is feeling better.  She is asking for a trial of regular food.  She denies recurrent bleeding since her admission.  She denies lightheadedness or chest pain.  Assessment & Plan:  Bright red blood per rectum -GI bleed Suspect diverticular source with prior episodes of same -monitor hemoglobin in serial fashion -follow for possible recurrent bleeding -advance diet as tolerated  HTN Blood pressure trending upward -adjust medical therapy if trend persists  HLD Resume usual home medication  DM2 CBG reasonably controlled  Hypokalemia Supplement as needed and follow  Code Status: FULL CODE Family Communication:  Status is: Observation  The patient remains OBS appropriate and will d/c before 2 midnights.  Dispo: The patient is from: Home              Anticipated d/c is to: Home              Anticipated d/c date is: 1 day              Patient currently is not medically stable to d/c.   Consultants:  [facility]  Objective: Blood pressure (!) 155/79, pulse 75, temperature 98.4 F (36.9 C), resp. rate 17, height 5\' 3"  (1.6 m), weight 111.1 kg, SpO2 94 %. No intake or output data in the 24 hours ending 09/24/20 1100 Filed Weights   09/23/20 0925  Weight: 111.1 kg    Examination: General: No acute respiratory distress Lungs: Clear to auscultation bilaterally without wheezes  or crackles Cardiovascular: Regular rate and rhythm without murmur gallop or rub normal S1 and S2 Abdomen: Nontender, nondistended, soft, bowel sounds positive, no rebound, no ascites, no appreciable mass Extremities: No significant cyanosis, clubbing, or edema bilateral lower extremities  CBC: Recent Labs  Lab 09/23/20 0949 09/23/20 0949 09/23/20 1414 09/23/20 1958 09/24/20 0420  WBC 8.2  --   --   --  8.6  HGB 13.7   < > 13.3 12.8 13.6  13.6  HCT 45.3  --   --   --  45.1  MCV 72.5*  --   --   --  72.0*  PLT 285  --   --   --  283   < > = values in this interval not displayed.   Basic Metabolic Panel: Recent Labs  Lab 09/23/20 0949 09/23/20 1414 09/24/20 0420  NA 140  --  139  K 3.2*  --  3.2*  CL 105  --  103  CO2 28  --  27  GLUCOSE 119*  --  109*  BUN 13  --  9  CREATININE 0.81  --  0.68  CALCIUM 9.0  --  8.8*  MG  --  1.8  --    GFR: Estimated Creatinine Clearance: 89.6 mL/min (by C-G formula based on SCr of 0.68 mg/dL).  Liver Function Tests: Recent Labs  Lab 09/23/20 0949 09/24/20 0420  AST 16 19  ALT 18 19  ALKPHOS 48 46  BILITOT 0.6 0.8  PROT 7.6 7.4  ALBUMIN 4.0 4.1   Recent Labs  Lab 09/23/20 0949  LIPASE 29    HbA1C: Hgb A1c MFr Bld  Date/Time Value Ref Range Status  09/23/2020 07:58 PM 8.4 (H) 4.8 - 5.6 % Final    Comment:    (NOTE) Pre diabetes:          5.7%-6.4%  Diabetes:              >6.4%  Glycemic control for   <7.0% adults with diabetes   02/29/2020 11:21 AM 9.3 (H) 4.8 - 5.6 % Final    Comment:    (NOTE) Pre diabetes:          5.7%-6.4% Diabetes:              >6.4% Glycemic control for   <7.0% adults with diabetes     CBG: Recent Labs  Lab 09/23/20 1709 09/23/20 2033 09/24/20 0747  GLUCAP 78 117* 107*    Recent Results (from the past 240 hour(s))  Respiratory Panel by RT PCR (Flu A&B, Covid) - Nasopharyngeal Swab     Status: None   Collection Time: 09/23/20 11:38 AM   Specimen: Nasopharyngeal Swab   Result Value Ref Range Status   SARS Coronavirus 2 by RT PCR NEGATIVE NEGATIVE Final    Comment: (NOTE) SARS-CoV-2 target nucleic acids are NOT DETECTED.  The SARS-CoV-2 RNA is generally detectable in upper respiratoy specimens during the acute phase of infection. The lowest concentration of SARS-CoV-2 viral copies this assay can detect is 131 copies/mL. A negative result does not preclude SARS-Cov-2 infection and should not be used as the sole basis for treatment or other patient management decisions. A negative result may occur with  improper specimen collection/handling, submission of specimen other than nasopharyngeal swab, presence of viral mutation(s) within the areas targeted by this assay, and inadequate number of viral copies (<131 copies/mL). A negative result must be combined with clinical observations, patient history, and epidemiological information. The expected result is Negative.  Fact Sheet for Patients:  PinkCheek.be  Fact Sheet for Healthcare Providers:  GravelBags.it  This test is no t yet approved or cleared by the [place] FDA and  has been authorized for detection and/or diagnosis of SARS-CoV-2 by FDA under an Emergency Use Authorization (EUA). This EUA will remain  in effect (meaning this test can be used) for the duration of the COVID-19 declaration under Section 564(b)(1) of the Act, 21 U.S.C. section 360bbb-3(b)(1), unless the authorization is terminated or revoked sooner.     Influenza A by PCR NEGATIVE NEGATIVE Final   Influenza B by PCR NEGATIVE NEGATIVE Final    Comment: (NOTE) The Xpert Xpress SARS-CoV-2/FLU/RSV assay is intended as an aid in  the diagnosis of influenza from Nasopharyngeal swab specimens and  should not be used as a sole basis for treatment. Nasal washings and  aspirates are unacceptable for Xpert Xpress SARS-CoV-2/FLU/RSV  testing.  Fact Sheet for  Patients: PinkCheek.be  Fact Sheet for Healthcare Providers: GravelBags.it  This test is not yet approved or cleared by the [place] FDA and  has been authorized for detection and/or diagnosis of SARS-CoV-2 by  FDA under an Emergency Use Authorization (EUA). This EUA will remain  in effect (meaning this test can be used) for the duration of the  Covid-19 declaration under Section 564(b)(1) of the Act, 21  U.S.C. section 360bbb-3(b)(1), unless the authorization is  terminated  or revoked. Performed at [facility], [place] [address], [place], [place] [address]      Scheduled Meds: . atorvastatin  10 mg Oral Daily  . carvedilol  12.5 mg Oral BID WC  . insulin aspart  0-15 Units Subcutaneous TID WC  . insulin glargine  30 Units Subcutaneous QHS  . irbesartan  300 mg Oral Daily     LOS: 0 days   Cherene Altes, MD [facility] Office  [phone] Pager - Text Page per Amion  If 7PM-7AM, please contact night-coverage per Amion 09/24/2020, 11:00 AM

## 2020-09-24 NOTE — Progress Notes (Signed)
    Progress Note   Subjective  Chief Complaint: GI bleed  Patient reports no further bowel movement since yesterday morning, definitely no further blood, she would like something more to eat than the full liquid diet that she is on, no real complaints today.  She is happy to go home if we feel like she can.  Does ask questions about her potassium.   Objective   Vital signs in last 24 hours: Temp:  [97.8 F (36.6 C)-98.4 F (36.9 C)] 98.2 F (36.8 C) (11/09 1328) Pulse Rate:  [71-83] 77 (11/09 1328) Resp:  [15-29] 15 (11/09 1328) BP: (133-158)/(69-92) 133/69 (11/09 1328) SpO2:  [93 %-97 %] 97 % (11/09 1328) Last BM Date: 09/23/20 General:    AA female in NAD Heart:  Regular rate and rhythm; no murmurs Lungs: Respirations even and unlabored, lungs CTA bilaterally Abdomen:  Soft, nontender and nondistended. Normal bowel sounds. Extremities:  Without edema. Neurologic:  Alert and oriented,  grossly normal neurologically. Psych:  Cooperative. Normal mood and affect.  Lab Results: Recent Labs    09/23/20 0949 09/23/20 1414 09/23/20 1958 09/24/20 0420 09/24/20 1127  WBC 8.2  --   --  8.6 7.7  HGB 13.7   < > 12.8 13.6  13.6 13.7  HCT 45.3  --   --  45.1 45.1  PLT 285  --   --  283 294   < > = values in this interval not displayed.   BMET Recent Labs    09/23/20 0949 09/24/20 0420  NA 140 139  K 3.2* 3.2*  CL 105 103  CO2 28 27  GLUCOSE 119* 109*  BUN 13 9  CREATININE 0.81 0.68  CALCIUM 9.0 8.8*   LFT Recent Labs    09/24/20 0420  PROT 7.4  ALBUMIN 4.1  AST 19  ALT 19  ALKPHOS 46  BILITOT 0.8     Assessment / Plan:   Assessment: 1.  GI bleed: Most likely diverticular with a history of this in the past, no further bowel movement since yesterday morning, hemoglobin stable 2.  Hypokalemia: 3.2  Plan: 1.  Continue to monitor hemoglobin during hospitalization, though she is going to stay another night would recommend changing this to every 12 hours 2.   From a GI standpoint patient is cleared to go home 3.  Recheck of potassium shows it has not moved after supplementation overnight, would recommend that she is sent home with potassium of discharge today 4.  We are okay with patient discharge today, please await any final recommendations from Dr. Tarri Glenn.  Thank you for kind consultation.  We will sign off.  If she ends up staying and starts bleeding again please let us know.   LOS: 0 days   Levin Erp  09/24/2020, 2:49 PM

## 2020-09-25 DIAGNOSIS — K922 Gastrointestinal hemorrhage, unspecified: Secondary | ICD-10-CM | POA: Diagnosis not present

## 2020-09-25 DIAGNOSIS — I10 Essential (primary) hypertension: Secondary | ICD-10-CM | POA: Diagnosis not present

## 2020-09-25 LAB — CBC
HCT: 42.9 % (ref 36.0–46.0)
Hemoglobin: 12.9 g/dL (ref 12.0–15.0)
MCH: 22.1 pg — ABNORMAL LOW (ref 26.0–34.0)
MCHC: 30.1 g/dL (ref 30.0–36.0)
MCV: 73.5 fL — ABNORMAL LOW (ref 80.0–100.0)
Platelets: 280 10*3/uL (ref 150–400)
RBC: 5.84 MIL/uL — ABNORMAL HIGH (ref 3.87–5.11)
RDW: 19.8 % — ABNORMAL HIGH (ref 11.5–15.5)
WBC: 8.9 10*3/uL (ref 4.0–10.5)
nRBC: 0 % (ref 0.0–0.2)

## 2020-09-25 LAB — GLUCOSE, CAPILLARY: Glucose-Capillary: 170 mg/dL — ABNORMAL HIGH (ref 70–99)

## 2020-09-25 LAB — BASIC METABOLIC PANEL
Anion gap: 6 (ref 5–15)
BUN: 12 mg/dL (ref 6–20)
CO2: 26 mmol/L (ref 22–32)
Calcium: 8.5 mg/dL — ABNORMAL LOW (ref 8.9–10.3)
Chloride: 104 mmol/L (ref 98–111)
Creatinine, Ser: 0.68 mg/dL (ref 0.44–1.00)
GFR, Estimated: >60 mL/min (ref >60)
Glucose, Bld: 211 mg/dL — ABNORMAL HIGH (ref 70–99)
Potassium: 3.4 mmol/L — ABNORMAL LOW (ref 3.5–5.1)
Sodium: 136 mmol/L (ref 135–145)

## 2020-09-25 MED ORDER — POTASSIUM CHLORIDE CRYS ER 20 MEQ PO TBCR
40.0000 meq | EXTENDED_RELEASE_TABLET | Freq: Once | ORAL | Status: AC
  Administered 2020-09-25: 40 meq via ORAL
  Filled 2020-09-25: qty 2

## 2020-09-25 NOTE — Discharge Summary (Signed)
Physician Discharge Summary  Carol Mueller XLK:[id number] DOB: [birth year] DOA: 09/23/2020  PCP: [name], DO  Admit date: 09/23/2020 Discharge date: 09/25/2020  Admitted From: home Disposition:  home  Recommendations for Outpatient Follow-up:  1. Follow up with PCP in 1-2 weeks 2. Please obtain BMP/CBC in one week  Home Health: none Equipment/Devices: none  Discharge Condition: stable CODE STATUS: full code Diet recommendation: regular  HPI: Per admitting MD, Carol Mueller is a 59 y.o. female with medical history significant of recurrent diverticular bleed, DM, HTN. She states that this morning around 7:30, she started feeling some crampy abdominal pain and nausea. It was sudden onset. There was no vomiting, but she felt that she needed to have a BM. When she went to the bathroom, she saw bloody stool. This has happened to her several times before. So she recognized the possibility of a diverticular bleed and came directly to the ED. There are no other alleviating or aggravating factors. She did not try any treatments.    Hospital Course / Discharge diagnoses: Principal problem Bright red blood per rectum -GI bleed. Suspect diverticular source with prior episodes of same.  Gastroenterology consulted and followed patient while hospitalized, bleeding has stopped and they did not recommend any further work-up currently.  Her hemoglobin is remained stable, will be discharged home with outpatient follow-up  Active problems HTN-resume home medications on discharge HLD-resume home medications on discharge  DM2-resume home medications on discharge   Obesity-based on a BMI of 43, patient would benefit from weight loss   Discharge Instructions  Allergies as of 09/25/2020      Reactions   Norvasc [amlodipine Besylate] Swelling   ANGIOEDEMA   Nsaids Other (See Comments)   GI upset/ GI bleed      Medication List    TAKE these medications   acetaminophen 325 MG  tablet Commonly known as: TYLENOL Take 2 tablets (650 mg total) by mouth every 6 (six) hours as needed for mild pain (or Fever >/= 101).   atorvastatin 10 MG tablet Commonly known as: LIPITOR Take 10 mg by mouth daily.   Basaglar KwikPen 100 UNIT/ML Inject 40 Units into the skin at bedtime.   calcium citrate-vitamin D 315-200 MG-UNIT tablet Commonly known as: CITRACAL+D Take 1 tablet by mouth daily.   carvedilol 12.5 MG tablet Commonly known as: COREG Take 12.5 mg by mouth 2 (two) times daily with a meal.   dicyclomine 20 MG tablet Commonly known as: BENTYL TAKE 1 TABLET BY MOUTH TWICE A DAY What changed:   when to take this  reasons to take this   glimepiride 2 MG tablet Commonly known as: AMARYL Take 2 mg by mouth daily.   Jardiance 10 MG Tabs tablet Generic drug: empagliflozin Take 10 mg by mouth daily.   metFORMIN 1000 MG tablet Commonly known as: GLUCOPHAGE Take 1,000 mg by mouth 2 (two) times daily.   multivitamin with minerals tablet Take 1 tablet by mouth daily.   olmesartan 40 MG tablet Commonly known as: BENICAR Take 40 mg by mouth daily.       Consultations:  Gastroenterology   Procedures/Studies:   No results found.  Subjective: - no chest pain, shortness of breath, no abdominal pain, nausea or vomiting.   Discharge Exam: BP (!) 146/81   Pulse 73   Temp 98.3 F (36.8 C)   Resp 18   Ht 5\' 3"  (1.6 m)   Wt 111.1 kg   SpO2 98%   BMI 43.40  kg/m   General: Pt is alert, awake, not in acute distress Cardiovascular: RRR, S1/S2 + Respiratory: CTA bilaterally, no wheezing, no rhonchi   The results of significant diagnostics from this hospitalization (including imaging, microbiology, ancillary and laboratory) are listed below for reference.    Microbiology: Recent Results (from the past 240 hour(s))  Respiratory Panel by RT PCR (Flu A&B, Covid) - Nasopharyngeal Swab     Status: None   Collection Time: 09/23/20 11:38 AM   Specimen:  Nasopharyngeal Swab  Result Value Ref Range Status   SARS Coronavirus 2 by RT PCR NEGATIVE NEGATIVE Final    Comment: (NOTE) SARS-CoV-2 target nucleic acids are NOT DETECTED.  The SARS-CoV-2 RNA is generally detectable in upper respiratoy specimens during the acute phase of infection. The lowest concentration of SARS-CoV-2 viral copies this assay can detect is 131 copies/mL. A negative result does not preclude SARS-Cov-2 infection and should not be used as the sole basis for treatment or other patient management decisions. A negative result may occur with  improper specimen collection/handling, submission of specimen other than nasopharyngeal swab, presence of viral mutation(s) within the areas targeted by this assay, and inadequate number of viral copies (<131 copies/mL). A negative result must be combined with clinical observations, patient history, and epidemiological information. The expected result is Negative.  Fact Sheet for Patients:  PinkCheek.be  Fact Sheet for Healthcare Providers:  GravelBags.it  This test is no t yet approved or cleared by the [place] FDA and  has been authorized for detection and/or diagnosis of SARS-CoV-2 by FDA under an Emergency Use Authorization (EUA). This EUA will remain  in effect (meaning this test can be used) for the duration of the COVID-19 declaration under Section 564(b)(1) of the Act, 21 U.S.C. section 360bbb-3(b)(1), unless the authorization is terminated or revoked sooner.     Influenza A by PCR NEGATIVE NEGATIVE Final   Influenza B by PCR NEGATIVE NEGATIVE Final    Comment: (NOTE) The Xpert Xpress SARS-CoV-2/FLU/RSV assay is intended as an aid in  the diagnosis of influenza from Nasopharyngeal swab specimens and  should not be used as a sole basis for treatment. Nasal washings and  aspirates are unacceptable for Xpert Xpress SARS-CoV-2/FLU/RSV  testing.  Fact Sheet  for Patients: PinkCheek.be  Fact Sheet for Healthcare Providers: GravelBags.it  This test is not yet approved or cleared by the [place] FDA and  has been authorized for detection and/or diagnosis of SARS-CoV-2 by  FDA under an Emergency Use Authorization (EUA). This EUA will remain  in effect (meaning this test can be used) for the duration of the  Covid-19 declaration under Section 564(b)(1) of the Act, 21  U.S.C. section 360bbb-3(b)(1), unless the authorization is  terminated or revoked. Performed at [facility], [place] [address] [place], [place] [address]      Labs: Basic Metabolic Panel: Recent Labs  Lab 09/23/20 0949 09/23/20 1414 09/24/20 0420 09/24/20 1324 09/25/20 0352  NA 140  --  139 139 136  K 3.2*  --  3.2* 3.6 3.4*  CL 105  --  103 104 104  CO2 28  --  27 31 26   GLUCOSE 119*  --  109* 123* 211*  BUN 13  --  9 7 12   CREATININE 0.81  --  0.68 0.77 0.68  CALCIUM 9.0  --  8.8* 9.0 8.5*  MG  --  1.8  --   --   --    Liver Function Tests: Recent Labs  Lab 09/23/20 0949 09/24/20 0420 09/24/20 1324  AST 16 19 19   ALT 18 19 15   ALKPHOS 48 46 51  BILITOT 0.6 0.8 0.9  PROT 7.6 7.4 7.4  ALBUMIN 4.0 4.1 3.7   CBC: Recent Labs  Lab 09/23/20 0949 09/23/20 0949 09/23/20 1414 09/23/20 1958 09/24/20 0420 09/24/20 1127 09/25/20 0352  WBC 8.2  --   --   --  8.6 7.7 8.9  HGB 13.7   < > 13.3 12.8 13.6  13.6 13.7 12.9  HCT 45.3  --   --   --  45.1 45.1 42.9  MCV 72.5*  --   --   --  72.0* 72.5* 73.5*  PLT 285  --   --   --  283 294 280   < > = values in this interval not displayed.   CBG: Recent Labs  Lab 09/24/20 1126 09/24/20 1650 09/24/20 2015 09/24/20 2211 09/25/20 0722  GLUCAP 122* 133* 239* 212* 170*   Hgb A1c Recent Labs    09/23/20 1958  HGBA1C 8.4*   Lipid Profile No results for input(s): CHOL, HDL, LDLCALC, TRIG, CHOLHDL, LDLDIRECT in the last 72  hours. Thyroid function studies No results for input(s): TSH, T4TOTAL, T3FREE, THYROIDAB in the last 72 hours.  Invalid input(s): FREET3 Urinalysis    Component Value Date/Time   COLORURINE YELLOW 09/23/2020 [place] 09/23/2020 0954   LABSPEC 1.028 09/23/2020 0954   PHURINE 5.0 09/23/2020 0954   GLUCOSEU >=500 (A) 09/23/2020 0954   HGBUR NEGATIVE 09/23/2020 [place] 09/23/2020 0954   BILIRUBINUR neg 08/25/2014 1157   KETONESUR NEGATIVE 09/23/2020 0954   PROTEINUR NEGATIVE 09/23/2020 0954   UROBILINOGEN 0.2 08/25/2014 1157   NITRITE NEGATIVE 09/23/2020 0954   LEUKOCYTESUR NEGATIVE 09/23/2020 0954    FURTHER DISCHARGE INSTRUCTIONS:   Get Medicines reviewed and adjusted: Please take all your medications with you for your next visit with your Primary MD   Laboratory/radiological data: Please request your Primary MD to go over all hospital tests and procedure/radiological results at the follow up, please ask your Primary MD to get all Hospital records sent to his/her office.   In some cases, they will be blood work, cultures and biopsy results pending at the time of your discharge. Please request that your primary care M.D. goes through all the records of your hospital data and follows up on these results.   Also Note the following: If you experience worsening of your admission symptoms, develop shortness of breath, life threatening emergency, suicidal or homicidal thoughts you must seek medical attention immediately by calling 911 or calling your MD immediately  if symptoms less severe.   You must read complete instructions/literature along with all the possible adverse reactions/side effects for all the Medicines you take and that have been prescribed to you. Take any new Medicines after you have completely understood and accpet all the possible adverse reactions/side effects.    Do not drive when taking Pain medications or sleeping medications  (Benzodaizepines)   Do not take more than prescribed Pain, Sleep and Anxiety Medications. It is not advisable to combine anxiety,sleep and pain medications without talking with your primary care practitioner   Special Instructions: If you have smoked or chewed Tobacco  in the last 2 yrs please stop smoking, stop any regular Alcohol  and or any Recreational drug use.   Wear Seat belts while driving.   Please note: You were cared for by a hospitalist during your hospital  stay. Once you are discharged, your primary care physician will handle any further medical issues. Please note that NO REFILLS for any discharge medications will be authorized once you are discharged, as it is imperative that you return to your primary care physician (or establish a relationship with a primary care physician if you do not have one) for your post hospital discharge needs so that they can reassess your need for medications and monitor your lab values.  Time coordinating discharge: 25 minutes  SIGNED:  Marzetta Board, MD, PhD 09/25/2020, 9:07 AM

## 2020-09-25 NOTE — Progress Notes (Signed)
Went over discharge instructions with patient. Patient verbalized understanding.

## 2020-10-23 ENCOUNTER — Other Ambulatory Visit: Payer: Self-pay

## 2020-10-23 ENCOUNTER — Emergency Department (HOSPITAL_COMMUNITY): Payer: No Typology Code available for payment source

## 2020-10-23 ENCOUNTER — Inpatient Hospital Stay (HOSPITAL_COMMUNITY)
Admission: EM | Admit: 2020-10-23 | Discharge: 2020-10-26 | DRG: 378 | Disposition: A | Discharge status: Home / Self Care | Payer: No Typology Code available for payment source | Attending: Internal Medicine | Admitting: Internal Medicine

## 2020-10-23 ENCOUNTER — Telehealth: Payer: Self-pay | Admitting: Gastroenterology

## 2020-10-23 ENCOUNTER — Encounter (HOSPITAL_COMMUNITY): Payer: Self-pay | Admitting: Emergency Medicine

## 2020-10-23 ENCOUNTER — Inpatient Hospital Stay (HOSPITAL_COMMUNITY): Payer: No Typology Code available for payment source

## 2020-10-23 DIAGNOSIS — E249 Cushing's syndrome, unspecified: Secondary | ICD-10-CM | POA: Diagnosis present

## 2020-10-23 DIAGNOSIS — E119 Type 2 diabetes mellitus without complications: Secondary | ICD-10-CM | POA: Diagnosis not present

## 2020-10-23 DIAGNOSIS — E876 Hypokalemia: Secondary | ICD-10-CM | POA: Diagnosis present

## 2020-10-23 DIAGNOSIS — Z6841 Body Mass Index (BMI) 40.0 and over, adult: Secondary | ICD-10-CM

## 2020-10-23 DIAGNOSIS — K922 Gastrointestinal hemorrhage, unspecified: Secondary | ICD-10-CM | POA: Diagnosis present

## 2020-10-23 DIAGNOSIS — I10 Essential (primary) hypertension: Secondary | ICD-10-CM | POA: Diagnosis present

## 2020-10-23 DIAGNOSIS — Z7984 Long term (current) use of oral hypoglycemic drugs: Secondary | ICD-10-CM

## 2020-10-23 DIAGNOSIS — E1165 Type 2 diabetes mellitus with hyperglycemia: Secondary | ICD-10-CM | POA: Diagnosis present

## 2020-10-23 DIAGNOSIS — Z8719 Personal history of other diseases of the digestive system: Secondary | ICD-10-CM

## 2020-10-23 DIAGNOSIS — Z888 Allergy status to other drugs, medicaments and biological substances status: Secondary | ICD-10-CM | POA: Diagnosis not present

## 2020-10-23 DIAGNOSIS — Z20822 Contact with and (suspected) exposure to covid-19: Secondary | ICD-10-CM | POA: Diagnosis present

## 2020-10-23 DIAGNOSIS — D62 Acute posthemorrhagic anemia: Secondary | ICD-10-CM | POA: Diagnosis present

## 2020-10-23 DIAGNOSIS — Z833 Family history of diabetes mellitus: Secondary | ICD-10-CM

## 2020-10-23 DIAGNOSIS — E785 Hyperlipidemia, unspecified: Secondary | ICD-10-CM | POA: Diagnosis present

## 2020-10-23 DIAGNOSIS — Z882 Allergy status to sulfonamides status: Secondary | ICD-10-CM | POA: Diagnosis not present

## 2020-10-23 DIAGNOSIS — K5791 Diverticulosis of intestine, part unspecified, without perforation or abscess with bleeding: Secondary | ICD-10-CM | POA: Diagnosis not present

## 2020-10-23 DIAGNOSIS — E668 Other obesity: Secondary | ICD-10-CM | POA: Diagnosis present

## 2020-10-23 DIAGNOSIS — E1159 Type 2 diabetes mellitus with other circulatory complications: Secondary | ICD-10-CM | POA: Diagnosis present

## 2020-10-23 DIAGNOSIS — R509 Fever, unspecified: Secondary | ICD-10-CM

## 2020-10-23 DIAGNOSIS — Z9049 Acquired absence of other specified parts of digestive tract: Secondary | ICD-10-CM

## 2020-10-23 DIAGNOSIS — Z79899 Other long term (current) drug therapy: Secondary | ICD-10-CM

## 2020-10-23 DIAGNOSIS — Z8249 Family history of ischemic heart disease and other diseases of the circulatory system: Secondary | ICD-10-CM

## 2020-10-23 DIAGNOSIS — Z794 Long term (current) use of insulin: Secondary | ICD-10-CM | POA: Diagnosis not present

## 2020-10-23 DIAGNOSIS — R6510 Systemic inflammatory response syndrome (SIRS) of non-infectious origin without acute organ dysfunction: Secondary | ICD-10-CM | POA: Diagnosis present

## 2020-10-23 DIAGNOSIS — K5731 Diverticulosis of large intestine without perforation or abscess with bleeding: Secondary | ICD-10-CM | POA: Diagnosis not present

## 2020-10-23 LAB — RESP PANEL BY RT-PCR (FLU A&B, COVID) ARPGX2
Influenza A by PCR: NEGATIVE
Influenza B by PCR: NEGATIVE
SARS Coronavirus 2 by RT PCR: NEGATIVE

## 2020-10-23 LAB — CBC
HCT: 33.5 % — ABNORMAL LOW (ref 36.0–46.0)
HCT: 39.3 % (ref 36.0–46.0)
Hemoglobin: 10.2 g/dL — ABNORMAL LOW (ref 12.0–15.0)
Hemoglobin: 12.3 g/dL (ref 12.0–15.0)
MCH: 21.9 pg — ABNORMAL LOW (ref 26.0–34.0)
MCH: 22 pg — ABNORMAL LOW (ref 26.0–34.0)
MCHC: 30.4 g/dL (ref 30.0–36.0)
MCHC: 31.3 g/dL (ref 30.0–36.0)
MCV: 70.3 fL — ABNORMAL LOW (ref 80.0–100.0)
MCV: 71.9 fL — ABNORMAL LOW (ref 80.0–100.0)
Platelets: 274 10*3/uL (ref 150–400)
Platelets: 304 10*3/uL (ref 150–400)
RBC: 4.66 MIL/uL (ref 3.87–5.11)
RBC: 5.59 MIL/uL — ABNORMAL HIGH (ref 3.87–5.11)
RDW: 17.3 % — ABNORMAL HIGH (ref 11.5–15.5)
RDW: 17.4 % — ABNORMAL HIGH (ref 11.5–15.5)
WBC: 11.7 10*3/uL — ABNORMAL HIGH (ref 4.0–10.5)
WBC: 12.4 10*3/uL — ABNORMAL HIGH (ref 4.0–10.5)
nRBC: 0 % (ref 0.0–0.2)
nRBC: 0 % (ref 0.0–0.2)

## 2020-10-23 LAB — CBG MONITORING, ED
Glucose-Capillary: 100 mg/dL — ABNORMAL HIGH (ref 70–99)
Glucose-Capillary: 94 mg/dL (ref 70–99)

## 2020-10-23 LAB — PREPARE RBC (CROSSMATCH)

## 2020-10-23 LAB — COMPREHENSIVE METABOLIC PANEL
ALT: 65 U/L — ABNORMAL HIGH (ref 0–44)
AST: 35 U/L (ref 15–41)
Albumin: 3.5 g/dL (ref 3.5–5.0)
Alkaline Phosphatase: 75 U/L (ref 38–126)
Anion gap: 12 (ref 5–15)
BUN: 9 mg/dL (ref 6–20)
CO2: 25 mmol/L (ref 22–32)
Calcium: 8.8 mg/dL — ABNORMAL LOW (ref 8.9–10.3)
Chloride: 101 mmol/L (ref 98–111)
Creatinine, Ser: 0.75 mg/dL (ref 0.44–1.00)
GFR, Estimated: >60 mL/min (ref >60)
Glucose, Bld: 133 mg/dL — ABNORMAL HIGH (ref 70–99)
Potassium: 3.2 mmol/L — ABNORMAL LOW (ref 3.5–5.1)
Sodium: 138 mmol/L (ref 135–145)
Total Bilirubin: 0.7 mg/dL (ref 0.3–1.2)
Total Protein: 7.7 g/dL (ref 6.5–8.1)

## 2020-10-23 MED ORDER — SODIUM CHLORIDE 0.45 % IV SOLN: INTRAVENOUS | Status: DC

## 2020-10-23 MED ORDER — LABETALOL HCL 5 MG/ML IV SOLN
10.0000 mg | INTRAVENOUS | Status: DC | PRN
  Filled 2020-10-23: qty 4

## 2020-10-23 MED ORDER — INSULIN ASPART 100 UNIT/ML ~~LOC~~ SOLN
0.0000 [IU] | SUBCUTANEOUS | Status: DC
  Administered 2020-10-24 – 2020-10-25 (×4): 1 [IU] via SUBCUTANEOUS
  Administered 2020-10-25: 2 [IU] via SUBCUTANEOUS
  Administered 2020-10-25: 1 [IU] via SUBCUTANEOUS
  Administered 2020-10-25: 3 [IU] via SUBCUTANEOUS
  Administered 2020-10-26: 2 [IU] via SUBCUTANEOUS
  Filled 2020-10-23: qty 0.09

## 2020-10-23 MED ORDER — SODIUM CHLORIDE 0.9% IV SOLUTION: Freq: Once | INTRAVENOUS | Status: DC

## 2020-10-23 MED ORDER — SODIUM CHLORIDE 0.9 % IV SOLN: INTRAVENOUS | Status: AC

## 2020-10-23 NOTE — ED Triage Notes (Addendum)
Pt states that she has had 7 episodes of bloody stools today. Hx of same 3 weeks ago with diverticulosis being the only known cause. Denies vomiting. States previously she has had a fever x 5 days and got a shot of rocephin yesterday. Negative for flu and covid at pcp.  Alert and oriented.

## 2020-10-23 NOTE — ED Notes (Signed)
Attempted to get vitals however patient is still gone for testing.

## 2020-10-23 NOTE — ED Notes (Signed)
Attempted to get updated vitals on patient however she is gone for testing.

## 2020-10-23 NOTE — H&P (Addendum)
History and Physical        Hospital Admission Note Date: 10/23/2020  Patient name: Carol Mueller Medical record number: [id number] Date of birth: [birth year] Age: 59 y.o. Gender: female  PCP: [name], DO    Chief Complaint    Chief Complaint  Patient presents with  . GI Bleeding      HPI:   This is a 59 year old female with past medical history of recurrent diverticular bleed, pandiverticulosis and adenomatous colon polyps, diabetes, Cushing's disease s/p unilateral adrenalectomy, hypertension, obesity who presented to the ED with 7 episodes of bloody stools today.  She was recently hospitalized from 11/8-11/10 for the same.  She has been feeling ill for the past 6 days with fever and chills since this past Thursday and continued through the next couple days with her highest temperature around 102 F at home and had developed some sinus headache and congestion without any cough or sputum production.  She saw her PCP yesterday and was given a dose of Rocephin.  Covid and flu panel negative on Saturday.  Started feeling a bit better this morning and then the bleeding started.  No aspirin, NSAIDs or blood thinners.   ED Course: Low-grade fever 100.3 F, hemodynamically stable, on room air. Notable Labs: K3.2, AST 35, ALT 65, WBC 12.4, Hb 12.3, MCV 70, platelets 304.  ED MD discussed with GI for consult who recommended nuc med bleeding scan stat   Vitals:   10/23/20 1415 10/23/20 1500  BP: 110/76 127/87  Pulse: 93 90  Resp: (!) 23 16  Temp:    SpO2: 97% 98%     Review of Systems:  Review of Systems  All other systems reviewed and are negative.   Medical/Social/Family History   Past Medical History: Past Medical History:  Diagnosis Date  . Anemia   . Cushing's syndrome ([place])    due to adrenal tumor  . Diabetes mellitus without complication (HCC)     insulin-dependent  . Diverticulitis   . Diverticulosis   . GI bleed   . Hyperlipidemia   . Hypertension   . Polycystic ovary disease   . Sepsis ([place]) 05/2019    Past Surgical History:  Procedure Laterality Date  . ADRENALECTOMY  1996  . BREAST BIOPSY    . CHOLECYSTECTOMY    . COLONOSCOPY WITH PROPOFOL N/A 10/03/2019   Procedure: COLONOSCOPY WITH PROPOFOL;  Surgeon: Ladene Artist, MD;  Location: [facility];  Service: Endoscopy;  Laterality: N/A;  . ESOPHAGOGASTRODUODENOSCOPY (EGD) WITH PROPOFOL N/A 07/16/2015   Procedure: ESOPHAGOGASTRODUODENOSCOPY (EGD) WITH PROPOFOL;  Surgeon: [name], MD;  Location: [facility];  Service: Endoscopy;  Laterality: N/A;  . POLYPECTOMY  10/03/2019   Procedure: POLYPECTOMY;  Surgeon: Ladene Artist, MD;  Location: [facility];  Service: Endoscopy;;    Medications: Prior to Admission medications   Medication Sig Start Date End Date Taking? Authorizing Provider  acetaminophen (TYLENOL) 325 MG tablet Take 2 tablets (650 mg total) by mouth every 6 (six) hours as needed for mild pain (or Fever >/= 101). 09/19/19  Yes [name] I, MD  atorvastatin (LIPITOR) 10 MG tablet Take 10 mg by mouth daily. 03/06/19  Yes [provider]  calcium  citrate-vitamin D (CITRACAL+D) 315-200 MG-UNIT tablet Take 1 tablet by mouth daily.   Yes [provider]  carvedilol (COREG) 12.5 MG tablet Take 12.5 mg by mouth 2 (two) times daily with a meal.    Yes [provider]  dicyclomine (BENTYL) 20 MG tablet TAKE 1 TABLET BY MOUTH TWICE A DAY Patient taking differently: Take 20 mg by mouth as needed (Stomach pain).  06/02/17  Yes [name], MD  empagliflozin (JARDIANCE) 10 MG TABS tablet Take 10 mg by mouth daily.   Yes [provider]  glimepiride (AMARYL) 2 MG tablet Take 2 mg by mouth daily. 02/23/20  Yes [provider]  Insulin Glargine (BASAGLAR KWIKPEN) 100 UNIT/ML Inject 45 Units into the skin at bedtime.   02/20/20  Yes [provider]  metFORMIN (GLUCOPHAGE) 1000 MG tablet Take 500 mg by mouth 2 (two) times daily with a meal.  01/11/20  Yes [provider]  Multiple Vitamins-Minerals (MULTIVITAMIN WITH MINERALS) tablet Take 1 tablet by mouth daily.   Yes [provider]  olmesartan (BENICAR) 40 MG tablet Take 40 mg by mouth daily. 10/11/19  Yes [provider]    Allergies:   Allergies  Allergen Reactions  . Norvasc [Amlodipine Besylate] Swelling    ANGIOEDEMA  . Nsaids Other (See Comments)    GI upset/ GI bleed  . Sulfa Antibiotics Other (See Comments)    childhood    Social History:  reports that she has never smoked. She has never used smokeless tobacco. She reports that she does not drink alcohol and does not use drugs.  Family History: Family History  Problem Relation Age of Onset  . Hypertension Mother   . Cancer Mother        ?uterine or ovarian cancer  . Hypertension Father   . Diabetes Father   . Hypertension Sister   . Diabetes Sister   . Hypertension Brother   . Diabetes Brother   . Hypertension Maternal Grandfather   . Diabetes Paternal Grandmother   . Breast cancer Maternal Grandmother   . Colon cancer Neg Hx      Objective   Physical Exam: Blood pressure 127/87, pulse 90, temperature 99.4 F (37.4 C), resp. rate 16, height 5\' 3"  (1.6 m), weight 111.1 kg, SpO2 98 %.  Physical Exam Vitals and nursing note reviewed.  Constitutional:      Appearance: Normal appearance.  HENT:     Head: Normocephalic and atraumatic.  Eyes:     Conjunctiva/sclera: Conjunctivae normal.  Cardiovascular:     Rate and Rhythm: Normal rate and regular rhythm.  Pulmonary:     Effort: Pulmonary effort is normal.     Breath sounds: Normal breath sounds.  Abdominal:     General: Abdomen is flat.     Palpations: Abdomen is soft.  Musculoskeletal:        General: No swelling or tenderness.  Skin:    Coloration: Skin is not jaundiced or  pale.  Neurological:     Mental Status: She is alert. Mental status is at baseline.  Psychiatric:        Mood and Affect: Mood normal.        Behavior: Behavior normal.     LABS on Admission: I have personally reviewed all the labs and imaging below    Basic Metabolic Panel: Recent Labs  Lab 10/23/20 1415  NA 138  K 3.2*  CL 101  CO2 25  GLUCOSE 133*  BUN 9  CREATININE 0.75  CALCIUM 8.8*   Liver Function Tests: Recent Labs  Lab 10/23/20 1415  AST 35  ALT 65*  ALKPHOS 75  BILITOT 0.7  PROT 7.7  ALBUMIN 3.5   No results for input(s): LIPASE, AMYLASE in the last 168 hours. No results for input(s): AMMONIA in the last 168 hours. CBC: Recent Labs  Lab 10/23/20 1415  WBC 12.4*  HGB 12.3  HCT 39.3  MCV 70.3*  PLT 304   Cardiac Enzymes: No results for input(s): CKTOTAL, CKMB, CKMBINDEX, TROPONINI in the last 168 hours. BNP: Invalid input(s): POCBNP CBG: No results for input(s): GLUCAP in the last 168 hours.  Radiological Exams on Admission:  No results found.    EKG: not done   A & P   Principal Problem:   GI bleed Active Problems:   Essential hypertension   History of GI diverticular bleed   Insulin-requiring or dependent type II diabetes mellitus (HCC)   SIRS (systemic inflammatory response syndrome) ([place])   1. Recurrent GI bleed consistent with diverticular hemorrhage a. Hemodynamically stable on room air with stable Hb b. GI consulted: Proceed with nuclear tagged RBC scan and if bleeding is localized then proceed with CTA and IR consult for consideration of mesenteric angiogram, embolization.  Trend CBC and transfuse for Hb less than 7.5.  Continue IV fluids and n.p.o. for now  2. SIRS unclear source, possibly secondary to GI bleed and/or sinusitis a. Low-grade fever with leukocytosis b. Had fevers at home and was given rocephin by her PCP for possible sinusitis, currently improved c. Continue to monitor  3. Hypertension a. PRN  Hydralazine  4. Diabetes a. Monitor while NPO  5. Hyperlipidemia a. Restart statin when tolerating PO   DVT prophylaxis: SCD   Code Status: Prior  Diet: NPO Family Communication: Admission, patients condition and plan of care including tests being ordered have been discussed with the patient who indicates understanding and agrees with the plan and Code Status.  Disposition Plan: The appropriate patient status for this patient is INPATIENT. Inpatient status is judged to be reasonable and necessary in order to provide the required intensity of service to ensure the patient's safety. The patient's presenting symptoms, physical exam findings, and initial radiographic and laboratory data in the context of their chronic comorbidities is felt to place them at high risk for further clinical deterioration. Furthermore, it is not anticipated that the patient will be medically stable for discharge from the hospital within 2 midnights of admission. The following factors support the patient status of inpatient.   " The patient's presenting symptoms include GI bleed. " The worrisome physical exam findings include unremarkable. " The initial radiographic and laboratory data are worrisome because of unremarkable. " The chronic co-morbidities include recurrent GI bleed, diverticulosis.   * I certify that at the point of admission it is my clinical judgment that the patient will require inpatient hospital care spanning beyond 2 midnights from the point of admission due to high intensity of service, high risk for further deterioration and high frequency of surveillance required.*  Consultants  . GI  Procedures  Tagged RBC bleeding scan  Time Spent on Admission: 60 minutes    Harold Hedge, DO [facility]  10/23/2020, 5:46 PM

## 2020-10-23 NOTE — ED Notes (Signed)
Unable to get vitals. Patient is still gone for testing.

## 2020-10-23 NOTE — ED Notes (Signed)
Nuclear medicine here to transport patient to study

## 2020-10-23 NOTE — ED Provider Notes (Signed)
[facility] DEPT Provider Note   CSN: [id number] Arrival date & time: 10/23/20  1257     History Chief Complaint  Patient presents with  . GI Bleeding    Carol Mueller is a 59 y.o. female.  59 year old female with history of diverticulosis presents with bright red blood per rectum.  She has had some abdominal cramping but denies any overt pain.  No fever or chills.  No emesis noted.  States she has had about 7 stools with this.  Does not take any blood thinners.  Has a history of colonic polyps and some of those were removed about a year ago.  Denies any weakness with standing.  No treatment use prior to arrival        Past Medical History:  Diagnosis Date  . Anemia   . 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 Diabetes mellitus without complication (HCC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  . Diverticulitis   . Diverticulosis   . GI bleed   . Hyperlipidemia   . Hypertension   . Polycystic ovary disease   . Sepsis ([name]) 05/2019    Patient Active Problem List   Diagnosis Date Noted  . GIB (gastrointestinal bleeding) 09/23/2020  . Hematochezia 10/03/2019  . Benign neoplasm of transverse colon   . Benign neoplasm of descending colon   . GI bleed 10/02/2019  . Rectal bleeding 09/18/2019  . Fever   . Salmonella enteritis 05/28/2019  . 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 Sepsis ([name]) 05/24/2019  . Hypokalemia 06/02/2018  . Lower GI bleeding 06/02/2018  . Insulin-requiring or dependent type II diabetes mellitus ([name])   . Back pain with left-sided radiculopathy 09/29/2017  . Diarrhea 12/16/2016  . Lower GI bleed 11/30/2016  . History of GI diverticular bleed   . Diverticulosis of colon with hemorrhage 06/05/2016  . Ischemic colitis ([place]) 08/07/2015  . Abdominal pain   . Tachycardia 07/15/2015  . Polycystic disease, ovaries 09/12/2012  . Class 3 obesity due to excess calories with body mass index (BMI) of 40.0 to 44.9 in  adult 09/12/2012  . Essential hypertension 09/12/2012  . Hyperlipidemia with target LDL less than 100 09/12/2012  . 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 ([name]) 09/12/2012  . Allergic rhinitis 04/06/2012    Past Surgical History:  Procedure Laterality Date  . ADRENALECTOMY  1996  . BREAST BIOPSY    . CHOLECYSTECTOMY    . COLONOSCOPY WITH PROPOFOL N/A 10/03/2019   Procedure: COLONOSCOPY WITH PROPOFOL;  Surgeon: Ladene Artist, MD;  Location: [facility] END[facility]COPY;  Service: Endoscopy;  Laterality: N/A;  . ESOPHAGOGASTRODUODENOSCOPY (EGD) WITH PROPOFOL N/A 07/16/2015   Procedure: ESOPHAGOGASTRODUODENOSCOPY (EGD) WITH PROPOFOL;  Surgeon: [name], MD;  Location: [facility];  Service: Endoscopy;  Laterality: N/A;  . POLYPECTOMY  10/03/2019   Procedure: POLYPECTOMY;  Surgeon: Ladene Artist, MD;  Location: [facility];  Service: Endoscopy;;     OB History   No obstetric history on file.     Family History  Problem Relation Age of Onset  . Hypertension Mother   . Cancer Mother        ?uterine or ovarian cancer  . Hypertension Father   . Diabetes Father   . Hypertension Sister   . Diabetes Sister   . Hypertension Brother   . Diabetes Brother   . Hypertension Maternal Grandfather   . Diabetes Paternal Grandmother   . Breast cancer Maternal Grandmother   . Colon  cancer Neg Hx     Social History   Tobacco Use  . Smoking status: Never Smoker  . Smokeless tobacco: Never Used  Vaping Use  . Vaping Use: Never used  Substance Use Topics  . Alcohol use: No  . Drug use: No    Home Medications Prior to Admission medications   Medication Sig Start Date End Date Taking? Authorizing Provider  acetaminophen (TYLENOL) 325 MG tablet Take 2 tablets (650 mg total) by mouth every 6 (six) hours as needed for mild pain (or Fever >/= 101). 09/19/19   [name] I, MD  atorvastatin (LIPITOR) 10 MG tablet Take 10 mg by mouth daily. 03/06/19   [provider]  calcium  citrate-vitamin D (CITRACAL+D) 315-200 MG-UNIT tablet Take 1 tablet by mouth daily.    [provider]  carvedilol (COREG) 12.5 MG tablet Take 12.5 mg by mouth 2 (two) times daily with a meal.     [provider]  dicyclomine (BENTYL) 20 MG tablet TAKE 1 TABLET BY MOUTH TWICE A DAY Patient taking differently: Take 20 mg by mouth as needed (Stomach pain).  06/02/17   [name], MD  empagliflozin (JARDIANCE) 10 MG TABS tablet Take 10 mg by mouth daily.    [provider]  glimepiride (AMARYL) 2 MG tablet Take 2 mg by mouth daily. 02/23/20   [provider]  Insulin Glargine (BASAGLAR KWIKPEN) 100 UNIT/ML Inject 40 Units into the skin at bedtime.  02/20/20   [provider]  metFORMIN (GLUCOPHAGE) 1000 MG tablet Take 1,000 mg by mouth 2 (two) times daily.  01/11/20   [provider]  Multiple Vitamins-Minerals (MULTIVITAMIN WITH MINERALS) tablet Take 1 tablet by mouth daily.    [provider]  olmesartan (BENICAR) 40 MG tablet Take 40 mg by mouth daily. 10/11/19   [provider]    Allergies    Norvasc [amlodipine besylate], Nsaids, and Sulfa antibiotics  Review of Systems   Review of Systems  All other systems reviewed and are negative.   Physical Exam Updated Vital Signs BP (!) 146/93 (BP Location: Left Arm)   Pulse 97   Temp 99.4 F (37.4 C)   Resp 20   Ht 1.6 m (5\' 3" )   Wt 111.1 kg   SpO2 100%   BMI 43.40 kg/m   Physical Exam Vitals and nursing note reviewed.  Constitutional:      General: She is not in acute distress.    Appearance: Normal appearance. She is well-developed. She is not toxic-appearing.  HENT:     Head: Normocephalic and atraumatic.  Eyes:     General: Lids are normal.     Conjunctiva/sclera: Conjunctivae normal.     Pupils: Pupils are equal, round, and reactive to light.  Neck:     Thyroid: No thyroid mass.     Trachea: No tracheal deviation.  Cardiovascular:     Rate and  Rhythm: Normal rate and regular rhythm.     Heart sounds: Normal heart sounds. No murmur heard.  No gallop.   Pulmonary:     Effort: Pulmonary effort is normal. No respiratory distress.     Breath sounds: Normal breath sounds. No stridor. No decreased breath sounds, wheezing, rhonchi or rales.  Abdominal:     General: Bowel sounds are normal. There is no distension.     Palpations: Abdomen is soft.     Tenderness: There is no abdominal tenderness. There is no rebound.  Genitourinary:    Rectum:  No external hemorrhoid.     Comments: Gross blood noted per rectum Musculoskeletal:        General: No tenderness. Normal range of motion.     Cervical back: Normal range of motion and neck supple.  Skin:    General: Skin is warm and dry.     Findings: No abrasion or rash.  Neurological:     Mental Status: She is alert and oriented to person, place, and time.     GCS: GCS eye subscore is 4. GCS verbal subscore is 5. GCS motor subscore is 6.     Cranial Nerves: No cranial nerve deficit.     Sensory: No sensory deficit.  Psychiatric:        Speech: Speech normal.        Behavior: Behavior normal.     ED Results / Procedures / Treatments   Labs (all labs ordered are listed, but only abnormal results are displayed) Labs Reviewed  COMPREHENSIVE METABOLIC PANEL  CBC  POC OCCULT BLOOD, ED  TYPE AND SCREEN    EKG None  Radiology No results found.  Procedures Procedures (including critical care time)  Medications Ordered in ED Medications - No data to display  ED Course  I have reviewed the triage vital signs and the nursing notes.  Pertinent labs & imaging results that were available during my care of the patient were reviewed by me and considered in my medical decision making (see chart for details).    MDM Rules/Calculators/A&P                          Blood work ordered here and is pending at this time.  Will consult GI for diverticular bleed.  Patient will require  admission Final Clinical Impression(s) / ED Diagnoses Final diagnoses:  None    Rx / DC Orders ED Discharge Orders    None       Lacretia Leigh, MD 10/23/20 1338

## 2020-10-23 NOTE — Consult Note (Addendum)
Consultation  Referring Provider:  ER MD / Zenia Resides Primary Care Physician:  [name], DO Primary Gastroenterologist:  Dr. Oretha [facility]rice  Reason for Consultation:  Acute GI bleed  HPI: Carol Mueller is a 59 y.o. female, known to Dr. [name] with history of recurrent diverticular bleeding, pandiverticulosis and adenomatous colon polyps. She also has history of adult onset diabetes mellitus, insulin-dependent, Cushing's disease status post unilateral adrenalectomy, polycystic disease and is status post cholecystectomy. Patient was just admitted 8 through 09/26/2020 with a self-limited diverticular hemorrhage, and did not require transfusion during that admission, hemoglobin was 12.9 on discharge. She had 1 other prior diverticular bleed earlier this year. Last colonoscopy was done in November 2020 when she was hospitalized for diverticular hemorrhage.  She had removal of 2 small polyps both 6 to 8 mm in size, one was adenomatous and 1 hyperplastic and noted to have pandiverticulosis and internal hemorrhoids. She also had a bleed in 2017 2016. She had CT of the abdomen pelvis in April 2021 when she was complaining of abdominal pain with some rectal bleeding.  She was noted to have extensive and fairly diffuse diverticulosis, no bowel wall thickening or evidence of colitis, she has an enlarged uterus with multiple masses consistent with leiomyomas. Patient says she had acute onset of recurrent bleeding this morning, and fairly rapidly had about 7 grossly bloody bowel movements all bright red blood.  She has had 2 more bowel movements since and feels about the same quantity.  She has not had any lightheadedness or dizziness, no chest pain or shortness of breath.  She has been having some mild cramping. Hemodynamically stable on arrival with blood pressure 104/75, pulse in the 90s. Initial hemoglobin 12.3.  Patient also relates that she had been feeling ill over the past 6 days, with fever and  chills onset this past Thursday.  This continued through the next couple of days.  Her highest temp was about 102 and that has not recurred over the past few days.  She has had some low-grade fevers.  She has developed sinus headache and congestion, no cough or sputum production. She was seen by her PCP yesterday and given a dose of Rocephin.  She had Covid testing and respiratory panel negative for influenza on Saturday. She says she was starting to feel a bit better this morning and then the bleeding started. No aspirin, NSAIDs or blood thinners.   Past Medical History:  Diagnosis Date  . Anemia   . 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 Diabetes mellitus without complication (HCC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  . Diverticulitis   . Diverticulosis   . GI bleed   . Hyperlipidemia   . Hypertension   . Polycystic ovary disease   . Sepsis ([place]) 05/2019    Past Surgical History:  Procedure Laterality Date  . ADRENALECTOMY  1996  . BREAST BIOPSY    . CHOLECYSTECTOMY    . COLONOSCOPY WITH PROPOFOL N/A 10/03/2019   Procedure: COLONOSCOPY WITH PROPOFOL;  Surgeon: Ladene Artist, MD;  Location: [facility];  Service: Endoscopy;  Laterality: N/A;  . ESOPHAGOGASTRODUODENOSCOPY (EGD) WITH PROPOFOL N/A 07/16/2015   Procedure: ESOPHAGOGASTRODUODENOSCOPY (EGD) WITH PROPOFOL;  Surgeon: [name], MD;  Location: [facility];  Service: Endoscopy;  Laterality: N/A;  . POLYPECTOMY  10/03/2019   Procedure: POLYPECTOMY;  Surgeon: Ladene Artist, MD;  Location: [facility];  Service: Endoscopy;;    Prior to Admission medications   Medication  Sig Start Date End Date Taking? Authorizing Provider  acetaminophen (TYLENOL) 325 MG tablet Take 2 tablets (650 mg total) by mouth every 6 (six) hours as needed for mild pain (or Fever >/= 101). 09/19/19   [name] I, MD  atorvastatin (LIPITOR) 10 MG tablet Take 10 mg by mouth daily. 03/06/19   [provider]  calcium  citrate-vitamin D (CITRACAL+D) 315-200 MG-UNIT tablet Take 1 tablet by mouth daily.    [provider]  carvedilol (COREG) 12.5 MG tablet Take 12.5 mg by mouth 2 (two) times daily with a meal.     [provider]  dicyclomine (BENTYL) 20 MG tablet TAKE 1 TABLET BY MOUTH TWICE A DAY Patient taking differently: Take 20 mg by mouth as needed (Stomach pain).  06/02/17   [name], MD  empagliflozin (JARDIANCE) 10 MG TABS tablet Take 10 mg by mouth daily.    [provider]  glimepiride (AMARYL) 2 MG tablet Take 2 mg by mouth daily. 02/23/20   [provider]  Insulin Glargine (BASAGLAR KWIKPEN) 100 UNIT/ML Inject 40 Units into the skin at bedtime.  02/20/20   [provider]  metFORMIN (GLUCOPHAGE) 1000 MG tablet Take 1,000 mg by mouth 2 (two) times daily.  01/11/20   [provider]  Multiple Vitamins-Minerals (MULTIVITAMIN WITH MINERALS) tablet Take 1 tablet by mouth daily.    [provider]  olmesartan (BENICAR) 40 MG tablet Take 40 mg by mouth daily. 10/11/19   [provider]    Current Facility-Administered Medications  Medication Dose Route Frequency Provider Last Rate Last Admin  . ferrous sulfate tablet 325 mg  325 mg Oral Q breakfast Bonnell Public, MD       Current Outpatient Medications  Medication Sig Dispense Refill  . acetaminophen (TYLENOL) 325 MG tablet Take 2 tablets (650 mg total) by mouth every 6 (six) hours as needed for mild pain (or Fever >/= 101). 40 tablet 0  . atorvastatin (LIPITOR) 10 MG tablet Take 10 mg by mouth daily.    . calcium citrate-vitamin D (CITRACAL+D) 315-200 MG-UNIT tablet Take 1 tablet by mouth daily.    . carvedilol (COREG) 12.5 MG tablet Take 12.5 mg by mouth 2 (two) times daily with a meal.     . dicyclomine (BENTYL) 20 MG tablet TAKE 1 TABLET BY MOUTH TWICE A DAY (Patient taking differently: Take 20 mg by mouth as needed (Stomach pain). ) 60 tablet 0  . empagliflozin  (JARDIANCE) 10 MG TABS tablet Take 10 mg by mouth daily.    Marland Kitchen glimepiride (AMARYL) 2 MG tablet Take 2 mg by mouth daily.    . Insulin Glargine (BASAGLAR KWIKPEN) 100 UNIT/ML Inject 40 Units into the skin at bedtime.     . metFORMIN (GLUCOPHAGE) 1000 MG tablet Take 1,000 mg by mouth 2 (two) times daily.     . Multiple Vitamins-Minerals (MULTIVITAMIN WITH MINERALS) tablet Take 1 tablet by mouth daily.    Marland Kitchen olmesartan (BENICAR) 40 MG tablet Take 40 mg by mouth daily.      Allergies as of 10/23/2020 - Review Complete 10/23/2020  Allergen Reaction Noted  . Norvasc [amlodipine besylate] Swelling 02/22/2012  . Nsaids Other (See Comments) 08/07/2015  . Sulfa antibiotics Other (See Comments) 02/22/2012    Family History  Problem Relation Age of Onset  . Hypertension Mother   . Cancer Mother        ?uterine or ovarian cancer  . Hypertension Father   . Diabetes Father   .  Hypertension Sister   . Diabetes Sister   . Hypertension Brother   . Diabetes Brother   . Hypertension Maternal Grandfather   . Diabetes Paternal Grandmother   . Breast cancer Maternal Grandmother   . Colon cancer Neg Hx     Social History   Socioeconomic History  . Marital status: Single    Spouse name: N/A  . Number of children: 0  . Years of education: Not on file  . Highest education level: Not on file  Occupational History  . Occupation: NURSE    Comment: [employer]  Tobacco Use  . Smoking status: Never Smoker  . Smokeless tobacco: Never Used  Vaping Use  . Vaping Use: Never used  Substance and Sexual Activity  . Alcohol use: No  . Drug use: No  . Sexual activity: Not Currently    Birth control/protection: Post-menopausal  Other Topics Concern  . Not on file  Social History Narrative   Lives alone. Parents live in [place].   Social Determinants of Health   Financial Resource Strain:   . Difficulty of Paying Living Expenses: Not on file  Food Insecurity:   . Worried About Charity fundraiser  in the Last Year: Not on file  . Ran Out of Food in the Last Year: Not on file  Transportation Needs:   . Lack of Transportation (Medical): Not on file  . Lack of Transportation (Non-Medical): Not on file  Physical Activity:   . Days of Exercise per Week: Not on file  . Minutes of Exercise per Session: Not on file  Stress:   . Feeling of Stress : Not on file  Social Connections:   . Frequency of Communication with Friends and Family: Not on file  . Frequency of Social Gatherings with Friends and Family: Not on file  . Attends Religious Services: Not on file  . Active Member of Clubs or Organizations: Not on file  . Attends Archivist Meetings: Not on file  . Marital Status: Not on file  Intimate Partner Violence:   . Fear of Current or Ex-Partner: Not on file  . Emotionally Abused: Not on file  . Physically Abused: Not on file  . Sexually Abused: Not on file    Review of Systems: Pertinent positive and negative review of systems were noted in the above HPI section.  All other review of systems was otherwise negative.  Physical Exam: Vital signs in last 24 hours: Temp:  [99.4 F (37.4 C)-100.3 F (37.9 C)] 99.4 F (37.4 C) (12/08 1333) Pulse Rate:  [97-100] 97 (12/08 1333) Resp:  [16-20] 20 (12/08 1333) BP: (145-146)/(93-100) 146/93 (12/08 1333) SpO2:  [93 %-100 %] 100 % (12/08 1333) Weight:  [111.1 kg] 111.1 kg (12/08 1310)   General:   Alert,  Well-developed, well-nourished, older African-American female pleasant and cooperative in NAD Head:  Normocephalic and atraumatic. Eyes:  Sclera clear, no icterus.   Conjunctiva pink. Ears:  Normal auditory acuity. Nose:  No deformity, discharge,  or lesions. Mouth:  No deformity or lesions.   Neck:  Supple; no masses or thyromegaly. Lungs:  Clear throughout to auscultation.   No wheezes, crackles, or rhonchi.  Heart:  Regular rate and rhythm; no murmurs, clicks, rubs,  or gallops. Abdomen:  Soft, there is some mild  tenderness in the left mid and left lower quadrant, no guarding or rebound BS active,nonpalp mass or hsm.   Rectal: Not done Msk:  Symmetrical without gross deformities. . Pulses:  Normal  pulses noted. Extremities:  Without clubbing or edema. Neurologic:  Alert and  oriented x4;  grossly normal neurologically. Skin:  Intact without significant lesions or rashes.. Psych:  Alert and cooperative. Normal mood and affect.  Intake/Output from previous day: No intake/output data recorded. Intake/Output this shift: No intake/output data recorded.   IMPRESSION:  #47 59 year old African-American female with history of pandiverticulosis, who presents with recurrent acute lower GI bleed consistent with recurrent diverticular hemorrhage.  She has had 8 or 9 bowel movements thus far today all grossly bloody. Currently hemodynamically stable and admitting hemoglobin 12.3. Patient had a 3-day hospitalization in November 2021 with diverticular bleed which was lower grade, did not require any transfusions.  She has had several episodes of diverticular bleeding over the past 5 years.  #2 history of adenomatous and hyperplastic polyps-up-to-date with last colonoscopy November 2020 #3 adult onset diabetes mellitus type 1 insulin-dependent #4.  Cushing's disease status post adrenalectomy #5.  Polycystic disease with multiple uterine leiomyomas #6.  Status post cholecystectomy  PLAN: N.p.o. except ice chips Normal saline at 100 cc/h Up with assist only to bathroom Nuc med bleeding scan has been ordered stat, if bleeding scan positive would proceed to CT angiography and potential embolization. Every 6 hour hemoglobins and transfuse for hemoglobin 7.5 or less.  Thank you will follow with you   Amy Trellis Paganini, PA-C  10/23/2020, 1:47 PM     Attending Physician Note   I have taken a history, examined the patient and reviewed the chart. I agree with the Advanced Practitioner's note, impression and  recommendations.  59 yo female with pandiverticulosis and recurrent acute LGI bleeding felt secondary to diverticulosis.  Proceed with nuclear tagged RBC scan. If bleeding is localized can proceed with CTA and IR consult for consideration of mesenteric angiogram, embolization. Trend CBC, transfuse for Hgb < 7.5, IVF, NPO for now.  Lucio Edward, MD [facility] Gastroenterology

## 2020-10-23 NOTE — Significant Event (Addendum)
Patient seen and examined at bedside. Hemodynamically stable at this time. Denies any abdominal pain. Chief complaint -rectal bleeding. History of present illness -59 year old female with history of diverticular bleed recently admitted last month for same at that time was symptomatically managed presents to the ER with multiple episodes of frank rectal bleeding since this morning. Patient recently has been experiencing fever and chills and was seen by her primary care physician initially morning and was given 1 dose of Rocephin for possible sinusitis following which her fevers improved. Denies any abdominal pain nausea vomiting or diarrhea. Denies chest pain or shortness of breath.  In the ER patient continued to have some bleeding labs were showing hemoglobin around 12.3 WBC 12.4 creatinine 1.74 I ALT 65 potassium 3.2. Covid test was negative. Patient was seen by gastroenterologist and recommended tagged red blood cell scan which was negative for any active bleed. At the time of my exam patient is hemodynamically stable did have 1 small amount of frank rectal bleeding prior to my arrival. Abdomen appears benign on exam.  Review of systems as present history of present illness.  Physical exam -  General -appears moderately nourished. HEENT -anicteric no pallor. Neck -no neck rigidity no mass felt. Eyes -anicteric no pallor. Chest -bilateral air entry present. Heart -S1-S2 heard. Abdomen -soft nontender bowel sounds present. Neuro -alert awake oriented to time place and person. Moves all extremities. Psychiatric -appears normal. Normal affect.  Labs show creatinine 1.75 hemoglobin 12.3 WBC 12.4 Covid test negative.  Assessment and plan -   #1. Acute GI bleed likely diverticular in origin appreciate GI consult. Tagged red blood cell scan is negative for any active bleed. We will keep patient n.p.o. IV fluids follow CBC. Further recommendations per GI. If there is significant bleeding overnight  will consult IR for angiogram. #2. Fever and chills cause not clear. Will get chest x-ray UA and blood cultures and CT abdomen. Did receive 1 dose of Rocephin at her primary care's office. #3. Hypertension we will keep patient on as needed IV labetalol since patient is n.p.o. #4. Diabetes mellitus we will keep patient on sliding scale coverage for now. #5. Anemia likely from blood draws follow CBC. #6. History of Cushing's disease status post adrenalectomy.  Since patient has fever chills active bleed will need inpatient status. Full code. Gastroenterologist has been consulted. Disposition likely home once stable.  Gean Birchwood

## 2020-10-24 ENCOUNTER — Other Ambulatory Visit: Payer: Self-pay

## 2020-10-24 ENCOUNTER — Inpatient Hospital Stay (HOSPITAL_COMMUNITY): Payer: No Typology Code available for payment source

## 2020-10-24 DIAGNOSIS — D62 Acute posthemorrhagic anemia: Secondary | ICD-10-CM

## 2020-10-24 DIAGNOSIS — K5731 Diverticulosis of large intestine without perforation or abscess with bleeding: Secondary | ICD-10-CM

## 2020-10-24 LAB — URINALYSIS, ROUTINE W REFLEX MICROSCOPIC
Bilirubin Urine: NEGATIVE
Glucose, UA: >=500 mg/dL — AB
Hgb urine dipstick: NEGATIVE
Ketones, ur: 80 mg/dL — AB
Leukocytes,Ua: NEGATIVE
Nitrite: NEGATIVE
Protein, ur: NEGATIVE mg/dL
Specific Gravity, Urine: 1.017 (ref 1.005–1.030)
pH: 5 (ref 5.0–8.0)

## 2020-10-24 LAB — CBG MONITORING, ED
Glucose-Capillary: 86 mg/dL (ref 70–99)
Glucose-Capillary: 87 mg/dL (ref 70–99)
Glucose-Capillary: 76 mg/dL (ref 70–99)
Glucose-Capillary: 131 mg/dL — ABNORMAL HIGH (ref 70–99)

## 2020-10-24 LAB — GLUCOSE, CAPILLARY: Glucose-Capillary: 126 mg/dL — ABNORMAL HIGH (ref 70–99)

## 2020-10-24 LAB — COMPREHENSIVE METABOLIC PANEL
ALT: 42 U/L (ref 0–44)
AST: 22 U/L (ref 15–41)
Albumin: 2.6 g/dL — ABNORMAL LOW (ref 3.5–5.0)
Alkaline Phosphatase: 49 U/L (ref 38–126)
Anion gap: 12 (ref 5–15)
BUN: 12 mg/dL (ref 6–20)
CO2: 21 mmol/L — ABNORMAL LOW (ref 22–32)
Calcium: 7 mg/dL — ABNORMAL LOW (ref 8.9–10.3)
Chloride: 108 mmol/L (ref 98–111)
Creatinine, Ser: 0.65 mg/dL (ref 0.44–1.00)
GFR, Estimated: >60 mL/min (ref >60)
Glucose, Bld: 79 mg/dL (ref 70–99)
Potassium: 2.7 mmol/L — CL (ref 3.5–5.1)
Sodium: 141 mmol/L (ref 135–145)
Total Bilirubin: 1.1 mg/dL (ref 0.3–1.2)
Total Protein: 5.9 g/dL — ABNORMAL LOW (ref 6.5–8.1)

## 2020-10-24 LAB — CBC
HCT: 34.9 % — ABNORMAL LOW (ref 36.0–46.0)
HCT: 31.5 % — ABNORMAL LOW (ref 36.0–46.0)
Hemoglobin: 10.5 g/dL — ABNORMAL LOW (ref 12.0–15.0)
Hemoglobin: 9.6 g/dL — ABNORMAL LOW (ref 12.0–15.0)
MCH: 21.7 pg — ABNORMAL LOW (ref 26.0–34.0)
MCH: 21.8 pg — ABNORMAL LOW (ref 26.0–34.0)
MCHC: 30.1 g/dL (ref 30.0–36.0)
MCHC: 30.5 g/dL (ref 30.0–36.0)
MCV: 72.1 fL — ABNORMAL LOW (ref 80.0–100.0)
MCV: 71.6 fL — ABNORMAL LOW (ref 80.0–100.0)
Platelets: 317 10*3/uL (ref 150–400)
Platelets: 368 10*3/uL (ref 150–400)
RBC: 4.84 MIL/uL (ref 3.87–5.11)
RBC: 4.4 MIL/uL (ref 3.87–5.11)
RDW: 17.2 % — ABNORMAL HIGH (ref 11.5–15.5)
RDW: 17.2 % — ABNORMAL HIGH (ref 11.5–15.5)
WBC: 11.6 10*3/uL — ABNORMAL HIGH (ref 4.0–10.5)
WBC: 12.3 10*3/uL — ABNORMAL HIGH (ref 4.0–10.5)
nRBC: 0 % (ref 0.0–0.2)
nRBC: 0.2 % (ref 0.0–0.2)

## 2020-10-24 MED ORDER — POTASSIUM CHLORIDE CRYS ER 20 MEQ PO TBCR
40.0000 meq | EXTENDED_RELEASE_TABLET | Freq: Once | ORAL | Status: AC
  Administered 2020-10-24: 40 meq via ORAL
  Filled 2020-10-24: qty 2

## 2020-10-24 MED ORDER — CARVEDILOL 3.125 MG PO TABS
3.1250 mg | ORAL_TABLET | Freq: Two times a day (BID) | ORAL | Status: DC
  Administered 2020-10-24: 3.125 mg via ORAL
  Filled 2020-10-24: qty 1

## 2020-10-24 MED ORDER — ACETAMINOPHEN 325 MG PO TABS
650.0000 mg | ORAL_TABLET | Freq: Four times a day (QID) | ORAL | Status: DC | PRN
  Administered 2020-10-24 – 2020-10-25 (×2): 650 mg via ORAL
  Filled 2020-10-24 (×2): qty 2

## 2020-10-24 MED ORDER — MAGNESIUM SULFATE 2 GM/50ML IV SOLN
2.0000 g | Freq: Once | INTRAVENOUS | Status: AC
  Administered 2020-10-24: 2 g via INTRAVENOUS
  Filled 2020-10-24: qty 50

## 2020-10-24 MED ORDER — ATORVASTATIN CALCIUM 10 MG PO TABS
10.0000 mg | ORAL_TABLET | Freq: Every day | ORAL | Status: DC
  Administered 2020-10-24 – 2020-10-26 (×3): 10 mg via ORAL
  Filled 2020-10-24 (×3): qty 1

## 2020-10-24 MED ORDER — POTASSIUM CHLORIDE 10 MEQ/100ML IV SOLN
10.0000 meq | INTRAVENOUS | Status: AC
  Administered 2020-10-24 (×3): 10 meq via INTRAVENOUS
  Filled 2020-10-24 (×3): qty 100

## 2020-10-24 MED ORDER — INSULIN GLARGINE 100 UNIT/ML ~~LOC~~ SOLN
20.0000 [IU] | Freq: Two times a day (BID) | SUBCUTANEOUS | Status: DC
  Administered 2020-10-24 – 2020-10-26 (×4): 20 [IU] via SUBCUTANEOUS
  Filled 2020-10-24 (×4): qty 0.2

## 2020-10-24 MED ORDER — LACTATED RINGERS IV SOLN: INTRAVENOUS | Status: DC

## 2020-10-24 NOTE — ED Notes (Signed)
Patient received lunch tray (soft diet)

## 2020-10-24 NOTE — ED Notes (Signed)
Hourly rounding complete. Pt resting comfortably, sitting up in bed with bed in lowest position, locked. Vitals updated. All needs met at this time. Will continue to monitor.

## 2020-10-24 NOTE — ED Notes (Signed)
Gave patient diet [place]

## 2020-10-24 NOTE — ED Notes (Signed)
Report given to [place]

## 2020-10-24 NOTE — Progress Notes (Signed)
PROGRESS NOTE    Carol Mueller  GYI:[id number] DOB: 10-26-1961 DOA: 10/23/2020 PCP: [name], DO    Chief Complaint  Patient presents with  . GI Bleeding    Brief Narrative:  This is a 59 year old female with past medical history of recurrent diverticular bleed, pandiverticulosis and adenomatous colon polyps, diabetes, Cushing's disease s/p unilateral adrenalectomy, hypertension, obesity who presented to the ED with 7 episodes of bloody stools today.  She was recently hospitalized from 11/8-11/10 for the same.  She has been feeling ill for the past 6 days with fever and chills since this past Thursday and continued through the next couple days with her highest temperature around 102 F at home and had developed some sinus headache and congestion without any cough or sputum production.  She saw her PCP yesterday and was given a dose of Rocephin.  Covid and flu panel negative on Saturday.  Started feeling a bit better this morning and then the bleeding started.  No aspirin, NSAIDs or blood thinners.  Subjective:  multiple episodes of frank rectal bleeding  this morning.   Assessment & Plan:   Principal Problem:   GI bleed Active Problems:   Essential hypertension   History of GI diverticular bleed   Insulin-requiring or dependent type II diabetes mellitus (HCC)   SIRS (systemic inflammatory response syndrome) (HCC)   Acute GI bleeding  Recurrent lower GI bleed/acute blood loss anemia -GI consulted thought secondary to diverticular lower bleed -Bleeding scan was negative for acute bleed -Monitor hemoglobin hgb, transfuse if hgb less than 7.5 per GI, continue hydration - will follow GI recommendation   Hypokalemia/hypomagnesemia Replace K and mag, keep on telemetry  SIRS Reported fever at home, no fever here, she does has leukocytosis, reports sinus issues Patient recently has been experiencing fever and chills and was seen by her primary care physician initially morning  and was given 1 dose of Rocephin for possible sinusitis following which her fevers improved Chest x-ray personally reviewed unremarkable UA pending collection CT abdomen without contrast ordered overnight Blood culture in process   Hypertension, home meds Coreg, Benicar held since admission, continue hold benicar, restart coreg at lower dose with holding parameters, blood pressure wnl currently, monitor  Hyperlipidemia, continue Lipitor  Insulin-dependent type 2 diabetes, uncontrolled, A1c 8.4 On insulin glargine 45 units nightly at home held due to n.p.o., currently on sliding scale insulin Home oral meds Amaryl, Metformin, Jardiance held  Cushing's disease s/p unilateral adrenalectomy ( right )  Class III obesity Body mass index is 43.4 kg/m.    DVT prophylaxis: SCDs Start: 10/23/20 1918   Code Status:full Family Communication: patient Disposition:   Status is: Inpatient  Dispo: The patient is from: home              Anticipated d/c is to: home              Anticipated d/c date is: TBD              Patient currently not medically stable to discharge  Consultants:   GI  IR  Procedures:   Bleeding scan  Antimicrobials:    None    Objective: Vitals:   10/24/20 0030 10/24/20 0220 10/24/20 0430 10/24/20 0630  BP: 106/73 (!) 165/70 (!) 143/80 140/79  Pulse: 85 89 94 89  Resp: (!) 30 (!) 26 17 (!) 23  Temp:      TempSrc:      SpO2: 94% 100% 92% 100%  Weight:  Height:       No intake or output data in the 24 hours ending 10/24/20 0806 Filed Weights   10/23/20 1310  Weight: 111.1 kg    Examination:  General exam: calm, NAD Respiratory system: Clear to auscultation. Respiratory effort normal. Cardiovascular system: S1 & S2 heard, RRR. No JVD, no murmur, No pedal edema. Gastrointestinal system: Abdomen is nondistended, soft and nontender. Normal bowel sounds heard. Central nervous system: Alert and oriented. No focal neurological  deficits. Extremities: Symmetric 5 x 5 power. Skin: No rashes, lesions or ulcers Psychiatry: Judgement and insight appear normal. Mood & affect appropriate.     Data Reviewed: I have personally reviewed following labs and imaging studies  CBC: Recent Labs  Lab 10/23/20 1415 10/23/20 2330 10/24/20 0436  WBC 12.4* 11.7* 11.6*  HGB 12.3 10.2* 10.5*  HCT 39.3 33.5* 34.9*  MCV 70.3* 71.9* 72.1*  PLT 304 274 527    Basic Metabolic Panel: Recent Labs  Lab 10/23/20 1415 10/24/20 0436  NA 138 141  K 3.2* 2.7*  CL 101 108  CO2 25 21*  GLUCOSE 133* 79  BUN 9 12  CREATININE 0.75 0.65  CALCIUM 8.8* 7.0*    GFR: Estimated Creatinine Clearance: 89.6 mL/min (by C-G formula based on SCr of 0.65 mg/dL).  Liver Function Tests: Recent Labs  Lab 10/23/20 1415 10/24/20 0436  AST 35 22  ALT 65* 42  ALKPHOS 75 49  BILITOT 0.7 1.1  PROT 7.7 5.9*  ALBUMIN 3.5 2.6*    CBG: Recent Labs  Lab 10/23/20 2250 10/23/20 2351 10/24/20 0352  GLUCAP 94 100* 86     Recent Results (from the past 240 hour(s))  Resp Panel by RT-PCR (Flu A&B, Covid) Nasopharyngeal Swab     Status: None   Collection Time: 10/23/20  2:19 PM   Specimen: Nasopharyngeal Swab; Nasopharyngeal(NP) swabs in vial transport medium  Result Value Ref Range Status   SARS Coronavirus 2 by RT PCR NEGATIVE NEGATIVE Final    Comment: (NOTE) SARS-CoV-2 target nucleic acids are NOT DETECTED.  The SARS-CoV-2 RNA is generally detectable in upper respiratory specimens during the acute phase of infection. The lowest concentration of SARS-CoV-2 viral copies this assay can detect is 138 copies/mL. A negative result does not preclude SARS-Cov-2 infection and should not be used as the sole basis for treatment or other patient management decisions. A negative result may occur with  improper specimen collection/handling, submission of specimen other than nasopharyngeal swab, presence of viral mutation(s) within the areas  targeted by this assay, and inadequate number of viral copies(<138 copies/mL). A negative result must be combined with clinical observations, patient history, and epidemiological information. The expected result is Negative.  Fact Sheet for Patients:  EntrepreneurPulse.com.au  Fact Sheet for Healthcare Providers:  IncredibleEmployment.be  This test is no t yet approved or cleared by the [place] FDA and  has been authorized for detection and/or diagnosis of SARS-CoV-2 by FDA under an Emergency Use Authorization (EUA). This EUA will remain  in effect (meaning this test can be used) for the duration of the COVID-19 declaration under Section 564(b)(1) of the Act, 21 U.S.C.section 360bbb-3(b)(1), unless the authorization is terminated  or revoked sooner.       Influenza A by PCR NEGATIVE NEGATIVE Final   Influenza B by PCR NEGATIVE NEGATIVE Final    Comment: (NOTE) The Xpert Xpress SARS-CoV-2/FLU/RSV plus assay is intended as an aid in the diagnosis of influenza from Nasopharyngeal swab specimens and should not be used  as a sole basis for treatment. Nasal washings and aspirates are unacceptable for Xpert Xpress SARS-CoV-2/FLU/RSV testing.  Fact Sheet for Patients: EntrepreneurPulse.com.au  Fact Sheet for Healthcare Providers: IncredibleEmployment.be  This test is not yet approved or cleared by the [place] FDA and has been authorized for detection and/or diagnosis of SARS-CoV-2 by FDA under an Emergency Use Authorization (EUA). This EUA will remain in effect (meaning this test can be used) for the duration of the COVID-19 declaration under Section 564(b)(1) of the Act, 21 U.S.C. section 360bbb-3(b)(1), unless the authorization is terminated or revoked.  Performed at [facility], [place] [address], [place], [place] [address]          Radiology Studies: NM GI Blood  Loss  Result Date: 10/23/2020 CLINICAL DATA:  Right red bloody stools. EXAM: NUCLEAR MEDICINE GASTROINTESTINAL BLEEDING SCAN TECHNIQUE: Sequential abdominal images were obtained following intravenous administration of Tc-61m labeled red blood cells. RADIOPHARMACEUTICALS:  21.7 mCi Tc-39m pertechnetate in-vitro labeled red cells. COMPARISON:  None. FINDINGS: The study is limited secondary to patient motion. There is normal accumulation of tracer within the vascular system. No evidence of acute/active gastrointestinal bleed is seen. Physiologic tracer activity is present within the urinary bladder secondary to free technetium. IMPRESSION: No evidence to suggest the presence of an acute GI bleed at the time of the exam. Electronically Signed   By: Virgina Norfolk M.D.   On: 10/23/2020 18:41   DG CHEST PORT 1 VIEW  Result Date: 10/23/2020 CLINICAL DATA:  Fever. EXAM: PORTABLE CHEST 1 VIEW COMPARISON:  October 22, 2020 FINDINGS: The heart size and mediastinal contours are within normal limits. Both lungs are clear. The visualized skeletal structures are unremarkable. Radiopaque surgical clips are seen overlying the right upper quadrant. IMPRESSION: No active disease. Electronically Signed   By: Virgina Norfolk M.D.   On: 10/23/2020 23:30        Scheduled Meds: . ferrous sulfate  325 mg Oral Q breakfast  . insulin aspart  0-9 Units Subcutaneous Q4H   Continuous Infusions: . sodium chloride 100 mL/hr at 10/23/20 2356  . magnesium sulfate bolus IVPB    . potassium chloride       LOS: 1 day   Time spent: 59mins Greater than 50% of this time was spent in counseling, explanation of diagnosis, planning of further management, and coordination of care.  I have personally reviewed and interpreted on  10/24/2020 daily labs, tele strips, imagings as discussed above under date review session and assessment and plans.  I reviewed all nursing notes, pharmacy notes, consultant notes,  vitals, pertinent  old records  I have discussed plan of care as described above with RN , patient and family on 10/24/2020  Voice Recognition /Dragon dictation system was used to create this note, attempts have been made to correct errors. Please contact the author with questions and/or clarifications.   Florencia Reasons, MD PhD FACP [facility]  Available via Epic secure chat 7am-7pm for nonurgent issues Please page for urgent issues To page the attending provider between 7A-7P or the covering provider during after hours 7P-7A, please log into the web site [email or web address] and access using universal [place] password for that web site. If you do not have the password, please call the hospital operator.    10/24/2020, 8:06 AM

## 2020-10-24 NOTE — ED Notes (Signed)
Report given to 3W RN, who told this writer that if patient is on telemetry the floor would not be able to accept them. Writer notified provider, and pt's bed is being changed.

## 2020-10-24 NOTE — ED Notes (Signed)
5:29 AM Date and time results received: 10/24/20  Test: potassium  Critical Value: 2.7  Name of Provider Notified: [facility], MD  Orders Received? Or Actions Taken?: Actions Taken: action taken

## 2020-10-24 NOTE — Progress Notes (Addendum)
Patient ID: Carol Mueller, female   DOB: [birth year], 60 y.o.   MRN: [id number]    Progress Note   Subjective  Day#2 CC; GI bleed  Bleeding scan last pm - negative  HGB12.3 -10.2-10.5  Pt says no active bleeding since 330 yesterday afternoon.  She did have 1 small stool this morning that looked like old blood. She is feeling okay, no abdominal pain. No further fever, sinus pressure better.  Chest x-ray negative   Objective   Vital signs in last 24 hours: Temp:  [99.4 F (37.4 C)-100.3 F (37.9 C)] 99.4 F (37.4 C) (12/08 1333) Pulse Rate:  [85-100] 92 (12/09 0730) Resp:  [16-30] 22 (12/09 0730) BP: (100-165)/(56-100) 131/82 (12/09 0730) SpO2:  [91 %-100 %] 98 % (12/09 0730) Weight:  [111.1 kg] 111.1 kg (12/08 1310)   General:    AA female  in NAD Heart:  Regular rate and rhythm; no murmurs Lungs: Respirations even and unlabored, lungs CTA bilaterally Abdomen:  Soft, nontender and nondistended. Normal bowel sounds. Extremities:  Without edema. Neurologic:  Alert and oriented,  grossly normal neurologically. Psych:  Cooperative. Normal mood and affect.  Intake/Output from previous day: No intake/output data recorded. Intake/Output this shift: No intake/output data recorded.  Lab Results: Recent Labs    10/23/20 1415 10/23/20 2330 10/24/20 0436  WBC 12.4* 11.7* 11.6*  HGB 12.3 10.2* 10.5*  HCT 39.3 33.5* 34.9*  PLT 304 274 317   BMET Recent Labs    10/23/20 1415 10/24/20 0436  NA 138 141  K 3.2* 2.7*  CL 101 108  CO2 25 21*  GLUCOSE 133* 79  BUN 9 12  CREATININE 0.75 0.65  CALCIUM 8.8* 7.0*   LFT Recent Labs    10/24/20 0436  PROT 5.9*  ALBUMIN 2.6*  AST 22  ALT 42  ALKPHOS 49  BILITOT 1.1   PT/INR No results for input(s): LABPROT, INR in the last 72 hours.  Studies/Results: NM GI Blood Loss  Result Date: 10/23/2020 CLINICAL DATA:  Right red bloody stools. EXAM: NUCLEAR MEDICINE GASTROINTESTINAL BLEEDING SCAN TECHNIQUE: Sequential  abdominal images were obtained following intravenous administration of Tc-65m labeled red blood cells. RADIOPHARMACEUTICALS:  21.7 mCi Tc-58m pertechnetate in-vitro labeled red cells. COMPARISON:  None. FINDINGS: The study is limited secondary to patient motion. There is normal accumulation of tracer within the vascular system. No evidence of acute/active gastrointestinal bleed is seen. Physiologic tracer activity is present within the urinary bladder secondary to free technetium. IMPRESSION: No evidence to suggest the presence of an acute GI bleed at the time of the exam. Electronically Signed   By: Virgina Norfolk M.D.   On: 10/23/2020 18:41   DG CHEST PORT 1 VIEW  Result Date: 10/23/2020 CLINICAL DATA:  Fever. EXAM: PORTABLE CHEST 1 VIEW COMPARISON:  October 22, 2020 FINDINGS: The heart size and mediastinal contours are within normal limits. Both lungs are clear. The visualized skeletal structures are unremarkable. Radiopaque surgical clips are seen overlying the right upper quadrant. IMPRESSION: No active disease. Electronically Signed   By: Virgina Norfolk M.D.   On: 10/23/2020 23:30    Review of systems  Denies chest pain dyspnea, cough or abdominal pain Is generalized fatigue, which she feels is greater than the most recent bleeding episode   Assessment / Plan:    #73 59 year old African-American female with history of pandiverticulosis and recurrent diverticular bleeding who presented to the emergency room yesterday after acute onset of multiple bloody stools at home yesterday morning. Bleeding consistent  with recurrent diverticular hemorrhage. Bleeding scan last evening negative. Last bloody stool about 4 PM yesterday. 2 g drop in hemoglobin since admit  Hopefully hemorrhage has resolved. Patient was hospitalized about 3 weeks ago with a self-limited diverticular bleed as well, significantly less bleeding at that time.  This is her third bleed in the past year  #2 history of  adenomatous and hyperplastic polyps-up-to-date with colonoscopy last done November 2020 #3 adult onset diabetes mellitus-insulin-dependent #4.  Cushing's disease status post adrenalectomy #5.  Polycystic disease with multiple uterine leiomyomas #6.  Status post cholecystectomy #7 anemia secondary to acute blood loss  Plan; start soft diet today Acute 8-hour hemoglobins, transfuse for hemoglobin less than 7.5 Possible discharge tomorrow if no further bleeding and hemoglobin stable. Patient completely avoids aspirin and NSAIDs.    Principal Problem:   GI bleed Active Problems:   Essential hypertension   History of GI diverticular bleed   Insulin-requiring or dependent type II diabetes mellitus (HCC)   SIRS (systemic inflammatory response syndrome) (HCC)   Acute GI bleeding     LOS: 1 day   Amy EsterwoodPA-C  10/24/2020, 8:53 AM  I have discussed the case with the PA, and that is the plan I formulated. I personally interviewed and examined the patient.  CC: Diverticular bleeding with acute blood loss anemia  Multiple prior episodes.  This current episode has stopped and the patient is stable.  Previous colonoscopy confirming pandiverticulosis, no plans for repeat colonoscopy at this point.  Diagnosis seems clear.  Start a soft diet today, every 12 hour hemoglobin and hematocrit, but sooner if recurrent bleeding occurs.  If patient starts passing blood and clots again, she needs a stat CT angiogram (creatinine normal).  Eventual consideration should be given to subtotal colectomy given this patient's relatively young age and recurrent diverticular bleeding.   25 minutes were spent on this encounter (including chart review, history/exam, counseling/coordination of care, and documentation)    Nelida Meuse III Office: [phone]

## 2020-10-24 NOTE — ED Notes (Signed)
Writer made 1 attempt to call report to RN on 6E.

## 2020-10-24 NOTE — ED Notes (Signed)
After patient went to bed lastnight she did not go to the bathroom again. NPO. No bloody BM since yesterday evening.

## 2020-10-24 NOTE — Progress Notes (Signed)
Report received from ED 

## 2020-10-25 ENCOUNTER — Other Ambulatory Visit: Payer: Self-pay

## 2020-10-25 ENCOUNTER — Telehealth: Payer: Self-pay

## 2020-10-25 DIAGNOSIS — K5791 Diverticulosis of intestine, part unspecified, without perforation or abscess with bleeding: Principal | ICD-10-CM

## 2020-10-25 DIAGNOSIS — K922 Gastrointestinal hemorrhage, unspecified: Secondary | ICD-10-CM

## 2020-10-25 LAB — URINALYSIS, COMPLETE (UACMP) WITH MICROSCOPIC
Bilirubin Urine: NEGATIVE
Glucose, UA: >=500 mg/dL — AB
Hgb urine dipstick: NEGATIVE
Ketones, ur: 20 mg/dL — AB
Leukocytes,Ua: NEGATIVE
Nitrite: NEGATIVE
Protein, ur: NEGATIVE mg/dL
Specific Gravity, Urine: 1.018 (ref 1.005–1.030)
pH: 5 (ref 5.0–8.0)

## 2020-10-25 LAB — BASIC METABOLIC PANEL
Anion gap: 10 (ref 5–15)
BUN: 11 mg/dL (ref 6–20)
CO2: 24 mmol/L (ref 22–32)
Calcium: 8.4 mg/dL — ABNORMAL LOW (ref 8.9–10.3)
Chloride: 104 mmol/L (ref 98–111)
Creatinine, Ser: 0.69 mg/dL (ref 0.44–1.00)
GFR, Estimated: >60 mL/min (ref >60)
Glucose, Bld: 122 mg/dL — ABNORMAL HIGH (ref 70–99)
Potassium: 3.7 mmol/L (ref 3.5–5.1)
Sodium: 138 mmol/L (ref 135–145)

## 2020-10-25 LAB — CBC
HCT: 34.4 % — ABNORMAL LOW (ref 36.0–46.0)
Hemoglobin: 10.5 g/dL — ABNORMAL LOW (ref 12.0–15.0)
MCH: 21.9 pg — ABNORMAL LOW (ref 26.0–34.0)
MCHC: 30.5 g/dL (ref 30.0–36.0)
MCV: 71.7 fL — ABNORMAL LOW (ref 80.0–100.0)
Platelets: 426 10*3/uL — ABNORMAL HIGH (ref 150–400)
RBC: 4.8 MIL/uL (ref 3.87–5.11)
RDW: 17.4 % — ABNORMAL HIGH (ref 11.5–15.5)
WBC: 11.3 10*3/uL — ABNORMAL HIGH (ref 4.0–10.5)
nRBC: 0 % (ref 0.0–0.2)

## 2020-10-25 LAB — GLUCOSE, CAPILLARY
Glucose-Capillary: 102 mg/dL — ABNORMAL HIGH (ref 70–99)
Glucose-Capillary: 125 mg/dL — ABNORMAL HIGH (ref 70–99)
Glucose-Capillary: 139 mg/dL — ABNORMAL HIGH (ref 70–99)
Glucose-Capillary: 142 mg/dL — ABNORMAL HIGH (ref 70–99)
Glucose-Capillary: 175 mg/dL — ABNORMAL HIGH (ref 70–99)
Glucose-Capillary: 222 mg/dL — ABNORMAL HIGH (ref 70–99)
Glucose-Capillary: 94 mg/dL (ref 70–99)

## 2020-10-25 LAB — IRON AND TIBC
Iron: 58 ug/dL (ref 28–170)
Saturation Ratios: 21 % (ref 10.4–31.8)
TIBC: 278 ug/dL (ref 250–450)
UIBC: 220 ug/dL

## 2020-10-25 LAB — FOLATE: Folate: 22.9 ng/mL (ref >5.9)

## 2020-10-25 LAB — VITAMIN B12: Vitamin B-12: 491 pg/mL (ref 180–914)

## 2020-10-25 LAB — MAGNESIUM: Magnesium: 2.3 mg/dL (ref 1.7–2.4)

## 2020-10-25 LAB — FERRITIN: Ferritin: 163 ng/mL (ref 11–307)

## 2020-10-25 MED ORDER — CARVEDILOL 12.5 MG PO TABS
12.5000 mg | ORAL_TABLET | Freq: Two times a day (BID) | ORAL | Status: DC
  Administered 2020-10-25 – 2020-10-26 (×3): 12.5 mg via ORAL
  Filled 2020-10-25 (×3): qty 1

## 2020-10-25 MED ORDER — IRBESARTAN 300 MG PO TABS
300.0000 mg | ORAL_TABLET | Freq: Every day | ORAL | Status: DC
  Administered 2020-10-25 – 2020-10-26 (×2): 300 mg via ORAL
  Filled 2020-10-25 (×2): qty 1

## 2020-10-25 NOTE — Progress Notes (Signed)
Nutrition Brief Note  Patient identified on the Malnutrition Screening Tool (MST) Report  No weight loss per weight records. Consuming 100% of meals at this time.  Wt Readings from Last 15 Encounters:  10/23/20 111.1 kg  09/23/20 111.1 kg  10/30/19 108.2 kg  10/04/19 112.5 kg  09/19/19 108 kg  05/25/19 117.9 kg  06/03/18 107.9 kg  09/29/17 108.9 kg  08/23/17 113.9 kg  02/08/17 112 kg  12/10/16 110.5 kg  11/30/16 111.1 kg  06/05/16 112.6 kg  11/13/15 111.6 kg  11/05/15 112.3 kg    Body mass index is 43.4 kg/m. Patient meets criteria for morbid obesity based on current BMI.   Current diet order is soft, patient is consuming approximately 100% of meals at this time. Labs and medications reviewed.   No nutrition interventions warranted at this time. If nutrition issues arise, please consult RD.   Clayton Bibles, MS, RD, LDN Inpatient Clinical Dietitian Contact information available via Amion

## 2020-10-25 NOTE — Telephone Encounter (Signed)
-----   Message from Alfredia Ferguson, PA-C sent at 10/25/2020  9:56 AM EST ----- Regarding: labs and office appt Beth, please place orders for patient to have follow-up CBC next week on Wednesday or Thursday. She will be discharged this weekend from hospital after diverticular bleed.  We will also need office follow-up, okay to schedule with me or her primary GI MD in 2 to 3 weeks.

## 2020-10-25 NOTE — Progress Notes (Signed)
PROGRESS NOTE    Carol Mueller  PYP:[id number] DOB: [birth year] DOA: 10/23/2020 PCP: [name], DO    Chief Complaint  Patient presents with  . GI Bleeding    Brief Narrative:  This is a 59 year old female with past medical history of recurrent diverticular bleed, pandiverticulosis and adenomatous colon polyps, diabetes, Cushing's disease s/p unilateral adrenalectomy, hypertension, obesity who presented to the ED with 7 episodes of bloody stools today.  She was recently hospitalized from 11/8-11/10 for the same.  She has been feeling ill for the past 6 days with fever and chills since this past Thursday and continued through the next couple days with her highest temperature around 102 F at home and had developed some sinus headache and congestion without any cough or sputum production.  She saw her PCP yesterday and was given a dose of Rocephin.  Covid and flu panel negative on Saturday.  Started feeling a bit better this morning and then the bleeding started.  No aspirin, NSAIDs or blood thinners.  Subjective:  Last bm yesterday around noon, reports left side abdominal pain, feeling chills  denies urinary symptoms, no cough,   Assessment & Plan:   Principal Problem:   GI bleed Active Problems:   Essential hypertension   History of GI diverticular bleed   Insulin-requiring or dependent type II diabetes mellitus (HCC)   SIRS (systemic inflammatory response syndrome) (HCC)   Acute GI bleeding  Recurrent lower GI bleed/acute blood loss anemia -GI consulted thought secondary to diverticular lower bleed -Bleeding scan was negative for acute bleed -Monitor hemoglobin hgb, transfuse if hgb less than 7.5 per GI - will follow GI recommendation   Hypokalemia/hypomagnesemia Replace K and mag, keep on telemetry  SIRS Reported fever at home, no fever here, she does has leukocytosis, reports sinus issues Patient recently has been experiencing fever and chills and was seen by her  primary care physician initially morning and was given 1 dose of Rocephin for possible sinusitis following which her fevers improved Chest x-ray personally reviewed unremarkable UA + ketone, rare  Bacteria, negative nitrite, negative leukocyte CT abdomen without contrast positive for diverticulosis, no diverticulitis Blood culture no growth   Hypertension,  Blood pressure held initially due to active bleeding , bleeding seems stopped , blood pressure start to trend up  Resume home blood pressure medication  Coreg, Benicar , monitor  Hyperlipidemia, continue Lipitor  Insulin-dependent type 2 diabetes, uncontrolled, A1c 8.4 On insulin glargine 45 units nightly at home held on admission due to npo  Started back on diet, start lantus 20untis bid , continue ssi Home oral meds Amaryl, Metformin, Jardiance held, plan to resume at discharge  Cushing's disease s/p unilateral adrenalectomy ( right )  Class III obesity Body mass index is 43.4 kg/m.    DVT prophylaxis: SCDs Start: 10/23/20 1918   Code Status:full Family Communication: patient Disposition:   Status is: Inpatient  Dispo: The patient is from: home              Anticipated d/c is to: home              Anticipated d/c date is: possibly tomorrow               Patient currently not medically stable to discharge  Consultants:   GI  IR  Procedures:   Bleeding scan  Antimicrobials:    None    Objective: Vitals:   10/24/20 2006 10/25/20 0008 10/25/20 0421 10/25/20 0900  BP: (!) 153/84 Marland Kitchen)  145/85 (!) 163/84   Pulse: 91 84 86   Resp: 20 20 20    Temp: 97.8 F (36.6 C) (!) 97.5 F (36.4 C) 98.3 F (36.8 C) 99.4 F (37.4 C)  TempSrc: Oral Oral Oral Oral  SpO2: 95% 94% 94%   Weight:      Height:        Intake/Output Summary (Last 24 hours) at 10/25/2020 0938 Last data filed at 10/25/2020 0809 Gross per 24 hour  Intake 2272.9 ml  Output --  Net 2272.9 ml   Filed Weights   10/23/20 1310  Weight:  111.1 kg    Examination:  General exam: calm, NAD Respiratory system: Clear to auscultation. Respiratory effort normal. Cardiovascular system: S1 & S2 heard, RRR. No JVD, no murmur, No pedal edema. Gastrointestinal system: Abdomen is mild tender, no guarding ,no rebound. Normal bowel sounds heard. Central nervous system: Alert and oriented. No focal neurological deficits. Extremities: Symmetric 5 x 5 power. Skin: No rashes, lesions or ulcers Psychiatry: Judgement and insight appear normal. Mood & affect appropriate.     Data Reviewed: I have personally reviewed following labs and imaging studies  CBC: Recent Labs  Lab 10/23/20 1415 10/23/20 2330 10/24/20 0436 10/24/20 2113 10/25/20 0319  WBC 12.4* 11.7* 11.6* 12.3* 11.3*  HGB 12.3 10.2* 10.5* 9.6* 10.5*  HCT 39.3 33.5* 34.9* 31.5* 34.4*  MCV 70.3* 71.9* 72.1* 71.6* 71.7*  PLT 304 274 317 368 426*    Basic Metabolic Panel: Recent Labs  Lab 10/23/20 1415 10/24/20 0436 10/25/20 0319  NA 138 141 138  K 3.2* 2.7* 3.7  CL 101 108 104  CO2 25 21* 24  GLUCOSE 133* 79 122*  BUN 9 12 11   CREATININE 0.75 0.65 0.69  CALCIUM 8.8* 7.0* 8.4*  MG  --   --  2.3    GFR: Estimated Creatinine Clearance: 89.6 mL/min (by C-G formula based on SCr of 0.69 mg/dL).  Liver Function Tests: Recent Labs  Lab 10/23/20 1415 10/24/20 0436  AST 35 22  ALT 65* 42  ALKPHOS 75 49  BILITOT 0.7 1.1  PROT 7.7 5.9*  ALBUMIN 3.5 2.6*    CBG: Recent Labs  Lab 10/24/20 1442 10/24/20 2007 10/25/20 0010 10/25/20 0422 10/25/20 0807  GLUCAP 131* 126* 125* 102* 94     Recent Results (from the past 240 hour(s))  Resp Panel by RT-PCR (Flu A&B, Covid) Nasopharyngeal Swab     Status: None   Collection Time: 10/23/20  2:19 PM   Specimen: Nasopharyngeal Swab; Nasopharyngeal(NP) swabs in vial transport medium  Result Value Ref Range Status   SARS Coronavirus 2 by RT PCR NEGATIVE NEGATIVE Final    Comment: (NOTE) SARS-CoV-2 target nucleic  acids are NOT DETECTED.  The SARS-CoV-2 RNA is generally detectable in upper respiratory specimens during the acute phase of infection. The lowest concentration of SARS-CoV-2 viral copies this assay can detect is 138 copies/mL. A negative result does not preclude SARS-Cov-2 infection and should not be used as the sole basis for treatment or other patient management decisions. A negative result may occur with  improper specimen collection/handling, submission of specimen other than nasopharyngeal swab, presence of viral mutation(s) within the areas targeted by this assay, and inadequate number of viral copies(<138 copies/mL). A negative result must be combined with clinical observations, patient history, and epidemiological information. The expected result is Negative.  Fact Sheet for Patients:  EntrepreneurPulse.com.au  Fact Sheet for Healthcare Providers:  IncredibleEmployment.be  This test is no t yet approved or  cleared by the [place] and  has been authorized for detection and/or diagnosis of SARS-CoV-2 by FDA under an Emergency Use Authorization (EUA). This EUA will remain  in effect (meaning this test can be used) for the duration of the COVID-19 declaration under Section 564(b)(1) of the Act, 21 U.S.C.section 360bbb-3(b)(1), unless the authorization is terminated  or revoked sooner.       Influenza A by PCR NEGATIVE NEGATIVE Final   Influenza B by PCR NEGATIVE NEGATIVE Final    Comment: (NOTE) The Xpert Xpress SARS-CoV-2/FLU/RSV plus assay is intended as an aid in the diagnosis of influenza from Nasopharyngeal swab specimens and should not be used as a sole basis for treatment. Nasal washings and aspirates are unacceptable for Xpert Xpress SARS-CoV-2/FLU/RSV testing.  Fact Sheet for Patients: EntrepreneurPulse.com.au  Fact Sheet for Healthcare Providers: IncredibleEmployment.be  This  test is not yet approved or cleared by the [place] FDA and has been authorized for detection and/or diagnosis of SARS-CoV-2 by FDA under an Emergency Use Authorization (EUA). This EUA will remain in effect (meaning this test can be used) for the duration of the COVID-19 declaration under Section 564(b)(1) of the Act, 21 U.S.C. section 360bbb-3(b)(1), unless the authorization is terminated or revoked.  Performed at [facility], [place] [address], [place], [place] [address]   Culture, blood (routine x 2)     Status: None (Preliminary result)   Collection Time: 10/23/20 11:38 PM   Specimen: BLOOD  Result Value Ref Range Status   Specimen Description   Final    BLOOD LEFT ANTECUBITAL Performed at [place] [address], [place], [place] [address]    Special Requests   Final    BOTTLES DRAWN AEROBIC AND ANAEROBIC Blood Culture adequate volume Performed at [facility] [address] [place], [place] [address]    Culture   Final    NO GROWTH 1 DAY Performed at [facility], [place] [address] [place], [place] [address]    Report Status PENDING  Incomplete         Radiology Studies: NM GI Blood Loss  Result Date: 10/23/2020 CLINICAL DATA:  Right red bloody stools. EXAM: NUCLEAR MEDICINE GASTROINTESTINAL BLEEDING SCAN TECHNIQUE: Sequential abdominal images were obtained following intravenous administration of Tc-31m labeled red blood cells. RADIOPHARMACEUTICALS:  21.7 mCi Tc-24m pertechnetate in-vitro labeled red cells. COMPARISON:  None. FINDINGS: The study is limited secondary to patient motion. There is normal accumulation of tracer within the vascular system. No evidence of acute/active gastrointestinal bleed is seen. Physiologic tracer activity is present within the urinary bladder secondary to free technetium. IMPRESSION: No evidence to suggest the presence of an acute GI bleed at the time of the exam.  Electronically Signed   By: Virgina Norfolk M.D.   On: 10/23/2020 18:41   DG CHEST PORT 1 VIEW  Result Date: 10/23/2020 CLINICAL DATA:  Fever. EXAM: PORTABLE CHEST 1 VIEW COMPARISON:  October 22, 2020 FINDINGS: The heart size and mediastinal contours are within normal limits. Both lungs are clear. The visualized skeletal structures are unremarkable. Radiopaque surgical clips are seen overlying the right upper quadrant. IMPRESSION: No active disease. Electronically Signed   By: Virgina Norfolk M.D.   On: 10/23/2020 23:30   CT RENAL STONE STUDY  Result Date: 10/24/2020 CLINICAL DATA:  Acute epigastric abdominal pain. EXAM: CT ABDOMEN AND PELVIS WITHOUT CONTRAST TECHNIQUE: Multidetector CT imaging of the abdomen and pelvis was performed following the standard protocol without  IV contrast. COMPARISON:  February 29, 2020. FINDINGS: Lower chest: No acute abnormality. Hepatobiliary: No focal liver abnormality is seen. Status post cholecystectomy. No biliary dilatation. Pancreas: Unremarkable. No pancreatic ductal dilatation or surrounding inflammatory changes. Spleen: Normal in size without focal abnormality. Adrenals/Urinary Tract: Status post right adrenalectomy. Left adrenal gland is unremarkable. Stable bilateral renal cysts are noted. No hydronephrosis or renal obstruction is noted. No renal or ureteral calculi are noted. Urinary bladder is unremarkable. Stomach/Bowel: Stomach is within normal limits. Appendix appears normal. No evidence of bowel wall thickening, distention, or inflammatory changes. Diverticulosis is noted throughout the colon without inflammation. Vascular/Lymphatic: No significant vascular findings are present. No enlarged abdominal or pelvic lymph nodes. Reproductive: Multiple large calcified uterine fibroids are noted. No adnexal abnormality is noted. Other: No abdominal wall hernia or abnormality. No abdominopelvic ascites. Musculoskeletal: No acute or significant osseous findings.  IMPRESSION: 1. Diverticulosis throughout the colon without inflammation. 2. Multiple large calcified uterine fibroids are noted. 3. Status post right adrenalectomy. 4. No acute abnormality seen in the abdomen or pelvis. Electronically Signed   By: Marijo Conception M.D.   On: 10/24/2020 08:57        Scheduled Meds: . atorvastatin  10 mg Oral Daily  . carvedilol  12.5 mg Oral BID WC  . insulin aspart  0-9 Units Subcutaneous Q4H  . insulin glargine  20 Units Subcutaneous BID  . irbesartan  300 mg Oral Daily   Continuous Infusions:    LOS: 2 days   Time spent: 68mins Greater than 50% of this time was spent in counseling, explanation of diagnosis, planning of further management, and coordination of care.  I have personally reviewed and interpreted on  10/25/2020 daily labs, tele strips, imagings as discussed above under date review session and assessment and plans.  I reviewed all nursing notes, pharmacy notes, consultant notes,  vitals, pertinent old records  I have discussed plan of care as described above with RN , patient and family on 10/25/2020  Voice Recognition /Dragon dictation system was used to create this note, attempts have been made to correct errors. Please contact the author with questions and/or clarifications.   Florencia Reasons, MD PhD FACP [facility]  Available via Epic secure chat 7am-7pm for nonurgent issues Please page for urgent issues To page the attending provider between 7A-7P or the covering provider during after hours 7P-7A, please log into the web site [email or web address] and access using universal [employer] password for that web site. If you do not have the password, please call the hospital operator.    10/25/2020, 9:38 AM

## 2020-10-25 NOTE — Progress Notes (Signed)
Patient ID: Carol Mueller, female   DOB: 1961/08/13, 59 y.o.   MRN: [id number]    Progress Note   Subjective   Day # 3 CC; Diverticular bleed  WBC 11.3/hgb 10.5 stable  K 3.7  ferritin 163/ Fe 58 / [id number]/ Sat 21  Temp 99.4  No further bleeding yesterday, maroonish old blood on tissue this morning with urinating but no bowel movement.  She is not feeling well today ,, no significant headache or sinus pressure, no follow-up, she has had some abdominal discomfort more on the right side since last night.  Says her urine is darker, denies any dysuria. Continues to have a mildly elevated WBC and has been having intermittent low-grade temps. She has received Rocephin by her PCP on Monday for fever chills and possible sinusitis.    Objective   Vital signs in last 24 hours: Temp:  [97.5 F (36.4 C)-99.4 F (37.4 C)] 98.3 F (36.8 C) (12/10 0421) Pulse Rate:  [84-110] 86 (12/10 0421) Resp:  [13-32] 20 (12/10 0421) BP: (115-163)/(70-85) 163/84 (12/10 0421) SpO2:  [92 %-100 %] 94 % (12/10 0421) Last BM Date: 10/25/20 General:    AA female in NAD Heart:  Regular rate and rhythm; no murmurs Lungs: Respirations even and unlabored, lungs CTA bilaterally Abdomen:  Soft, she does have some mild tenderness the right and right upper quadrant no rebound nondistended. Normal bowel sounds. Extremities:  Without edema. Neurologic:  Alert and oriented,  grossly normal neurologically. Psych:  Cooperative. Normal mood and affect.  Intake/Output from previous day: 12/09 0701 - 12/10 0700 In: 1798.7 [P.O.:470; I.V.:1009.9; IV Piggyback:318.9] Out: -  Intake/Output this shift: Total I/O In: 420 [P.O.:420] Out: -   Lab Results: Recent Labs    10/24/20 0436 10/24/20 2113 10/25/20 0319  WBC 11.6* 12.3* 11.3*  HGB 10.5* 9.6* 10.5*  HCT 34.9* 31.5* 34.4*  PLT 317 368 426*   BMET Recent Labs    10/23/20 1415 10/24/20 0436 10/25/20 0319  NA 138 141 138  K 3.2* 2.7* 3.7  CL 101 108  104  CO2 25 21* 24  GLUCOSE 133* 79 122*  BUN 9 12 11   CREATININE 0.75 0.65 0.69  CALCIUM 8.8* 7.0* 8.4*   LFT Recent Labs    10/24/20 0436  PROT 5.9*  ALBUMIN 2.6*  AST 22  ALT 42  ALKPHOS 49  BILITOT 1.1   PT/INR No results for input(s): LABPROT, INR in the last 72 hours.  Studies/Results: NM GI Blood Loss  Result Date: 10/23/2020 CLINICAL DATA:  Right red bloody stools. EXAM: NUCLEAR MEDICINE GASTROINTESTINAL BLEEDING SCAN TECHNIQUE: Sequential abdominal images were obtained following intravenous administration of Tc-26m labeled red blood cells. RADIOPHARMACEUTICALS:  21.7 mCi Tc-53m pertechnetate in-vitro labeled red cells. COMPARISON:  None. FINDINGS: The study is limited secondary to patient motion. There is normal accumulation of tracer within the vascular system. No evidence of acute/active gastrointestinal bleed is seen. Physiologic tracer activity is present within the urinary bladder secondary to free technetium. IMPRESSION: No evidence to suggest the presence of an acute GI bleed at the time of the exam. Electronically Signed   By: Virgina Norfolk M.D.   On: 10/23/2020 18:41   DG CHEST PORT 1 VIEW  Result Date: 10/23/2020 CLINICAL DATA:  Fever. EXAM: PORTABLE CHEST 1 VIEW COMPARISON:  October 22, 2020 FINDINGS: The heart size and mediastinal contours are within normal limits. Both lungs are clear. The visualized skeletal structures are unremarkable. Radiopaque surgical clips are seen overlying the right upper  quadrant. IMPRESSION: No active disease. Electronically Signed   By: Virgina Norfolk M.D.   On: 10/23/2020 23:30   CT RENAL STONE STUDY  Result Date: 10/24/2020 CLINICAL DATA:  Acute epigastric abdominal pain. EXAM: CT ABDOMEN AND PELVIS WITHOUT CONTRAST TECHNIQUE: Multidetector CT imaging of the abdomen and pelvis was performed following the standard protocol without IV contrast. COMPARISON:  February 29, 2020. FINDINGS: Lower chest: No acute abnormality.  Hepatobiliary: No focal liver abnormality is seen. Status post cholecystectomy. No biliary dilatation. Pancreas: Unremarkable. No pancreatic ductal dilatation or surrounding inflammatory changes. Spleen: Normal in size without focal abnormality. Adrenals/Urinary Tract: Status post right adrenalectomy. Left adrenal gland is unremarkable. Stable bilateral renal cysts are noted. No hydronephrosis or renal obstruction is noted. No renal or ureteral calculi are noted. Urinary bladder is unremarkable. Stomach/Bowel: Stomach is within normal limits. Appendix appears normal. No evidence of bowel wall thickening, distention, or inflammatory changes. Diverticulosis is noted throughout the colon without inflammation. Vascular/Lymphatic: No significant vascular findings are present. No enlarged abdominal or pelvic lymph nodes. Reproductive: Multiple large calcified uterine fibroids are noted. No adnexal abnormality is noted. Other: No abdominal wall hernia or abnormality. No abdominopelvic ascites. Musculoskeletal: No acute or significant osseous findings. IMPRESSION: 1. Diverticulosis throughout the colon without inflammation. 2. Multiple large calcified uterine fibroids are noted. 3. Status post right adrenalectomy. 4. No acute abnormality seen in the abdomen or pelvis. Electronically Signed   By: Marijo Conception M.D.   On: 10/24/2020 08:57       Assessment / Plan:    #66 59 year old African-American female with history of recurrent diverticular bleeding, this is her third bleed this year.  Bleed appears to have resolved and hemoglobin stable over the past 24 hours.  Up-to-date with colonoscopy done in November 2020, also with history of adenomatous and hyperplastic polyps. Soft diet Hemoglobin every 12 as expect she will be here at least tomorrow. If hemoglobin remains stable this afternoon she is okay for discharge from GI perspective. We will arrange an outpatient lab follow-up next week through our office,  outpatient follow-up.  #2 recent unassociated illness with fever , chills, some myalgias and fatigue.  Etiology not clear. She is continuing to have symptoms with low-grade fever.  Felt better after IV Rocephin as an outpatient on Monday. Tested for Covid and influenza 5 days ago and negative.  We will check UA Chest x-ray negative  Defer to hospitalist, consider Rocephin empiric  #3 Cushing's disease status post right adrenalectomy 4.  Status post cholecystectomy 5.  Polycystic disease with multiple uterine leiomyomas 6.  Adult onset diabetes mellitus         Principal Problem:   GI bleed Active Problems:   Essential hypertension   History of GI diverticular bleed   Insulin-requiring or dependent type II diabetes mellitus (HCC)   SIRS (systemic inflammatory response syndrome) (HCC)   Acute GI bleeding     LOS: 2 days   Malala Trenkamp PA-C 10/25/2020, 8:43 AM

## 2020-10-26 DIAGNOSIS — Z794 Long term (current) use of insulin: Secondary | ICD-10-CM

## 2020-10-26 DIAGNOSIS — K922 Gastrointestinal hemorrhage, unspecified: Secondary | ICD-10-CM

## 2020-10-26 DIAGNOSIS — E119 Type 2 diabetes mellitus without complications: Secondary | ICD-10-CM

## 2020-10-26 LAB — COMPREHENSIVE METABOLIC PANEL
ALT: 34 U/L (ref 0–44)
AST: 21 U/L (ref 15–41)
Albumin: 3.1 g/dL — ABNORMAL LOW (ref 3.5–5.0)
Alkaline Phosphatase: 56 U/L (ref 38–126)
Anion gap: 10 (ref 5–15)
BUN: 8 mg/dL (ref 6–20)
CO2: 26 mmol/L (ref 22–32)
Calcium: 8.4 mg/dL — ABNORMAL LOW (ref 8.9–10.3)
Chloride: 104 mmol/L (ref 98–111)
Creatinine, Ser: 0.68 mg/dL (ref 0.44–1.00)
GFR, Estimated: >60 mL/min (ref >60)
Glucose, Bld: 148 mg/dL — ABNORMAL HIGH (ref 70–99)
Potassium: 3.6 mmol/L (ref 3.5–5.1)
Sodium: 140 mmol/L (ref 135–145)
Total Bilirubin: 0.4 mg/dL (ref 0.3–1.2)
Total Protein: 7 g/dL (ref 6.5–8.1)

## 2020-10-26 LAB — CBC
HCT: 32.3 % — ABNORMAL LOW (ref 36.0–46.0)
Hemoglobin: 9.7 g/dL — ABNORMAL LOW (ref 12.0–15.0)
MCH: 21.7 pg — ABNORMAL LOW (ref 26.0–34.0)
MCHC: 30 g/dL (ref 30.0–36.0)
MCV: 72.3 fL — ABNORMAL LOW (ref 80.0–100.0)
Platelets: 480 10*3/uL — ABNORMAL HIGH (ref 150–400)
RBC: 4.47 MIL/uL (ref 3.87–5.11)
RDW: 17.4 % — ABNORMAL HIGH (ref 11.5–15.5)
WBC: 9.6 10*3/uL (ref 4.0–10.5)
nRBC: 0.4 % — ABNORMAL HIGH (ref 0.0–0.2)

## 2020-10-26 LAB — GLUCOSE, CAPILLARY
Glucose-Capillary: 172 mg/dL — ABNORMAL HIGH (ref 70–99)
Glucose-Capillary: 120 mg/dL — ABNORMAL HIGH (ref 70–99)

## 2020-10-26 MED ORDER — FERROUS SULFATE 325 (65 FE) MG PO TABS: 325.0000 mg | ORAL_TABLET | ORAL | Status: DC

## 2020-10-26 MED ORDER — FERROUS SULFATE 325 (65 FE) MG PO TBEC: 325.0000 mg | DELAYED_RELEASE_TABLET | ORAL | 1 refills | Status: DC

## 2020-10-26 NOTE — Discharge Summary (Signed)
Discharge Summary  Carol Mueller XBJ:[id number] DOB: Mar 31, 1961  PCP: Carol Morning, DO  Admit date: 10/23/2020 Discharge date: 10/26/2020  Time spent: 70mins  Recommendations for Outpatient Follow-up:  1. F/u with PCP within a week  for hospital discharge follow up, repeat cbc/bmp at follow up 2. F/u with [facility] on 12/30  Discharge Diagnoses:  Active Hospital Problems   Diagnosis Date Noted  . GI bleed 10/02/2019  . SIRS (systemic inflammatory response syndrome) ([place]) 10/23/2020  . Acute GI bleeding 10/23/2020  . Insulin-requiring or dependent type II diabetes mellitus ([place])   . History of GI diverticular bleed   . Essential hypertension 09/12/2012    Resolved Hospital Problems  No resolved problems to display.    Discharge Condition: stable  Diet recommendation: heart healthy/carb modified  Filed Weights   10/23/20 1310  Weight: 111.1 kg    History of present illness: (Per admitting MD Dr. Neysa Bonito) This is a 59 year old female with past medical history of recurrent diverticular bleed, pandiverticulosis and adenomatous colon polyps, diabetes, Cushing's disease s/p unilateral adrenalectomy, hypertension, obesity who presented to the ED with 7 episodes of bloody stools today.  She was recently hospitalized from 11/8-11/10 for the same.  She has been feeling ill for the past 6 days with fever and chills since this past Thursday and continued through the next couple days with her highest temperature around 102 F at home and had developed some sinus headache and congestion without any cough or sputum production.  She saw her PCP yesterday and was given a dose of Rocephin.  Covid and flu panel negative on Saturday.  Started feeling a bit better this Mueller and then the bleeding started.  No aspirin, NSAIDs or blood thinners.   ED Course: Low-grade fever 100.3 F, hemodynamically stable, on room air. Notable Labs: K3.2, AST 35, ALT 65, WBC 12.4, Hb 12.3, MCV 70, platelets 304.   ED MD discussed with GI for consult who recommended nuc med bleeding scan stat  Hospital Course:  Principal Problem:   GI bleed Active Problems:   Essential hypertension   History of GI diverticular bleed   Insulin-requiring or dependent type II diabetes mellitus (HCC)   SIRS (systemic inflammatory response syndrome) (HCC)   Acute GI bleeding   Recurrent lower GI bleed/acute blood loss anemia -GI consulted thought secondary to diverticular lower bleed -Bleeding scan was negative for acute bleed -No more bleeding , she did not require blood transfusion , stool is [place] this Mueller ,  hemoglobin 9.7 at discharge -Follow-up with GI on December 30 -Follow-up with PCP, repeat CBC next week   Hypokalemia/hypomagnesemia Replaced and improved  SIRS Reported fever at home, no fever here, she does has leukocytosis, reports sinus issues Patient recently has been experiencing fever and chills and was seen by her primary care physician initially Mueller and was given 1 dose of Rocephin for possible sinusitis following which her fevers improved Chest x-ray personally reviewed unremarkable UA + ketone, rare  Bacteria, negative nitrite, negative leukocyte CT abdomen without contrast positive for diverticulosis, no diverticulitis Blood culture no growth WBC normalized at discharge, she is feeling better, she desires to go home   Hypertension,  Blood pressure held initially due to active bleeding , bleeding seems stopped , blood pressure start to trend up  Resume home blood pressure medication  Coreg, Benicar   Hyperlipidemia, continue Lipitor  Insulin-dependent type 2 diabetes, uncontrolled, A1c 8.4 Home oral meds Amaryl, Metformin, Jardiance held in the hospital, resumed at discharge Continue  insulin glargine 45 units nightly   Cushing's disease s/p unilateral adrenalectomy ( right )  Class III obesity Body mass index is 43.4 kg/m.    DVT prophylaxis: SCDs Start:  10/23/20 1918   Code Status:full Family Communication: patient Disposition:  Home    Consultants:   GI  IR  Procedures:   Bleeding scan  Antimicrobials:    None   Discharge Exam: BP (!) 155/85 (BP Location: Right Arm)   Pulse 88   Temp 98.7 F (37.1 C) (Oral)   Resp 15   Ht 5\' 3"  (1.6 m)   Wt 111.1 kg   SpO2 95%   BMI 43.40 kg/m   General: NAD Cardiovascular: RRR Respiratory: Normal respiratory effort  Discharge Instructions You were cared for by a hospitalist during your hospital stay. If you have any questions about your discharge medications or the care you received while you were in the hospital after you are discharged, you can call the unit and asked to speak with the hospitalist on call if the hospitalist that took care of you is not available. Once you are discharged, your primary care physician will handle any further medical issues. Please note that NO REFILLS for any discharge medications will be authorized once you are discharged, as it is imperative that you return to your primary care physician (or establish a relationship with a primary care physician if you do not have one) for your aftercare needs so that they can reassess your need for medications and monitor your lab values.  Discharge Instructions    Diet - low sodium heart healthy   Complete by: As directed    Carb modified diet   Increase activity slowly   Complete by: As directed      Allergies as of 10/26/2020      Reactions   Norvasc [amlodipine Besylate] Swelling   ANGIOEDEMA   Nsaids Other (See Comments)   GI upset/ GI bleed   Sulfa Antibiotics Other (See Comments)   childhood      Medication List    TAKE these medications   acetaminophen 325 MG tablet Commonly known as: TYLENOL Take 2 tablets (650 mg total) by mouth every 6 (six) hours as needed for mild pain (or Fever >/= 101).   atorvastatin 10 MG tablet Commonly known as: LIPITOR Take 10 mg by mouth daily.    Basaglar KwikPen 100 UNIT/ML Inject 45 Units into the skin at bedtime.   calcium citrate-vitamin D 315-200 MG-UNIT tablet Commonly known as: CITRACAL+D Take 1 tablet by mouth daily.   carvedilol 12.5 MG tablet Commonly known as: COREG Take 12.5 mg by mouth 2 (two) times daily with a meal.   dicyclomine 20 MG tablet Commonly known as: BENTYL TAKE 1 TABLET BY MOUTH TWICE A DAY What changed:   when to take this  reasons to take this   ferrous sulfate 325 (65 FE) MG EC tablet Take 1 tablet (325 mg total) by mouth every Monday, Wednesday, and Friday for 14 days. Start taking on: October 28, 2020   glimepiride 2 MG tablet Commonly known as: AMARYL Take 2 mg by mouth daily.   Jardiance 10 MG Tabs tablet Generic drug: empagliflozin Take 10 mg by mouth daily.   metFORMIN 1000 MG tablet Commonly known as: GLUCOPHAGE Take 500 mg by mouth 2 (two) times daily with a meal.   multivitamin with minerals tablet Take 1 tablet by mouth daily.   olmesartan 40 MG tablet Commonly known as: BENICAR Take 40 mg  by mouth daily.      Allergies  Allergen Reactions  . Norvasc [Amlodipine Besylate] Swelling    ANGIOEDEMA  . Nsaids Other (See Comments)    GI upset/ GI bleed  . Sulfa Antibiotics Other (See Comments)    childhood    Follow-up Information    Carol Morning, DO Follow up.   Specialty: Family Medicine Why: hospital discharge follow up, repeat cbc/bmp at follow up Contact information: [address] [place] [address] [phone]        [facility] Follow up.   Specialty: Gastroenterology Contact information: [address] [place] [address] [phone]               The results of significant diagnostics from this hospitalization (including imaging, microbiology, ancillary and laboratory) are listed below for reference.    Significant Diagnostic Studies: NM GI Blood Loss  Result Date:  10/23/2020 CLINICAL DATA:  Right red bloody stools. EXAM: NUCLEAR MEDICINE GASTROINTESTINAL BLEEDING SCAN TECHNIQUE: Sequential abdominal images were obtained following intravenous administration of Tc-1m labeled red blood cells. RADIOPHARMACEUTICALS:  21.7 mCi Tc-39m pertechnetate in-vitro labeled red cells. COMPARISON:  None. FINDINGS: The study is limited secondary to patient motion. There is normal accumulation of tracer within the vascular system. No evidence of acute/active gastrointestinal bleed is seen. Physiologic tracer activity is present within the urinary bladder secondary to free technetium. IMPRESSION: No evidence to suggest the presence of an acute GI bleed at the time of the exam. Electronically Signed   By: Virgina Norfolk M.D.   On: 10/23/2020 18:41   DG CHEST PORT 1 VIEW  Result Date: 10/23/2020 CLINICAL DATA:  Fever. EXAM: PORTABLE CHEST 1 VIEW COMPARISON:  October 22, 2020 FINDINGS: The heart size and mediastinal contours are within normal limits. Both lungs are clear. The visualized skeletal structures are unremarkable. Radiopaque surgical clips are seen overlying the right upper quadrant. IMPRESSION: No active disease. Electronically Signed   By: Virgina Norfolk M.D.   On: 10/23/2020 23:30   CT RENAL STONE STUDY  Result Date: 10/24/2020 CLINICAL DATA:  Acute epigastric abdominal pain. EXAM: CT ABDOMEN AND PELVIS WITHOUT CONTRAST TECHNIQUE: Multidetector CT imaging of the abdomen and pelvis was performed following the standard protocol without IV contrast. COMPARISON:  February 29, 2020. FINDINGS: Lower chest: No acute abnormality. Hepatobiliary: No focal liver abnormality is seen. Status post cholecystectomy. No biliary dilatation. Pancreas: Unremarkable. No pancreatic ductal dilatation or surrounding inflammatory changes. Spleen: Normal in size without focal abnormality. Adrenals/Urinary Tract: Status post right adrenalectomy. Left adrenal gland is unremarkable. Stable bilateral  renal cysts are noted. No hydronephrosis or renal obstruction is noted. No renal or ureteral calculi are noted. Urinary bladder is unremarkable. Stomach/Bowel: Stomach is within normal limits. Appendix appears normal. No evidence of bowel wall thickening, distention, or inflammatory changes. Diverticulosis is noted throughout the colon without inflammation. Vascular/Lymphatic: No significant vascular findings are present. No enlarged abdominal or pelvic lymph nodes. Reproductive: Multiple large calcified uterine fibroids are noted. No adnexal abnormality is noted. Other: No abdominal wall hernia or abnormality. No abdominopelvic ascites. Musculoskeletal: No acute or significant osseous findings. IMPRESSION: 1. Diverticulosis throughout the colon without inflammation. 2. Multiple large calcified uterine fibroids are noted. 3. Status post right adrenalectomy. 4. No acute abnormality seen in the abdomen or pelvis. Electronically Signed   By: Marijo Conception M.D.   On: 10/24/2020 08:57    Microbiology: Recent Results (from the past 240 hour(s))  Resp Panel by RT-PCR (Flu A&B,  Covid) Nasopharyngeal Swab     Status: None   Collection Time: 10/23/20  2:19 PM   Specimen: Nasopharyngeal Swab; Nasopharyngeal(NP) swabs in vial transport medium  Result Value Ref Range Status   SARS Coronavirus 2 by RT PCR NEGATIVE NEGATIVE Final    Comment: (NOTE) SARS-CoV-2 target nucleic acids are NOT DETECTED.  The SARS-CoV-2 RNA is generally detectable in upper respiratory specimens during the acute phase of infection. The lowest concentration of SARS-CoV-2 viral copies this assay can detect is 138 copies/mL. A negative result does not preclude SARS-Cov-2 infection and should not be used as the sole basis for treatment or other patient management decisions. A negative result may occur with  improper specimen collection/handling, submission of specimen other than nasopharyngeal swab, presence of viral mutation(s) within  the areas targeted by this assay, and inadequate number of viral copies(<138 copies/mL). A negative result must be combined with clinical observations, patient history, and epidemiological information. The expected result is Negative.  Fact Sheet for Patients:  EntrepreneurPulse.com.au  Fact Sheet for Healthcare Providers:  IncredibleEmployment.be  This test is no t yet approved or cleared by the [place] FDA and  has been authorized for detection and/or diagnosis of SARS-CoV-2 by FDA under an Emergency Use Authorization (EUA). This EUA will remain  in effect (meaning this test can be used) for the duration of the COVID-19 declaration under Section 564(b)(1) of the Act, 21 U.S.C.section 360bbb-3(b)(1), unless the authorization is terminated  or revoked sooner.       Influenza A by PCR NEGATIVE NEGATIVE Final   Influenza B by PCR NEGATIVE NEGATIVE Final    Comment: (NOTE) The Xpert Xpress SARS-CoV-2/FLU/RSV plus assay is intended as an aid in the diagnosis of influenza from Nasopharyngeal swab specimens and should not be used as a sole basis for treatment. Nasal washings and aspirates are unacceptable for Xpert Xpress SARS-CoV-2/FLU/RSV testing.  Fact Sheet for Patients: EntrepreneurPulse.com.au  Fact Sheet for Healthcare Providers: IncredibleEmployment.be  This test is not yet approved or cleared by the [place] FDA and has been authorized for detection and/or diagnosis of SARS-CoV-2 by FDA under an Emergency Use Authorization (EUA). This EUA will remain in effect (meaning this test can be used) for the duration of the COVID-19 declaration under Section 564(b)(1) of the Act, 21 U.S.C. section 360bbb-3(b)(1), unless the authorization is terminated or revoked.  Performed at [facility], [place] [address] [place], [place] [address]   Culture, blood (routine x 2)     Status:  None (Preliminary result)   Collection Time: 10/23/20 11:38 PM   Specimen: BLOOD  Result Value Ref Range Status   Specimen Description   Final    BLOOD LEFT ANTECUBITAL Performed at [facility] [address] [place], [place] [address]    Special Requests   Final    BOTTLES DRAWN AEROBIC AND ANAEROBIC Blood Culture adequate volume Performed at [facility] [address], [place], [place] [address]    Culture   Final    NO GROWTH 1 DAY Performed at [facility], [place] [address], [place], [place] [address]    Report Status PENDING  Incomplete     Labs: Basic Metabolic Panel: Recent Labs  Lab 10/23/20 1415 10/24/20 0436 10/25/20 0319 10/26/20 0636  NA 138 141 138 140  K 3.2* 2.7* 3.7 3.6  CL 101 108 104 104  CO2 25 21* 24 26  GLUCOSE 133* 79 122* 148*  BUN 9 12 11  8  CREATININE 0.75 0.65 0.69 0.68  CALCIUM 8.8* 7.0* 8.4* 8.4*  MG  --   --  2.3  --    Liver Function Tests: Recent Labs  Lab 10/23/20 1415 10/24/20 0436 10/26/20 0636  AST 35 22 21  ALT 65* 42 34  ALKPHOS 75 49 56  BILITOT 0.7 1.1 0.4  PROT 7.7 5.9* 7.0  ALBUMIN 3.5 2.6* 3.1*   No results for input(s): LIPASE, AMYLASE in the last 168 hours. No results for input(s): AMMONIA in the last 168 hours. CBC: Recent Labs  Lab 10/23/20 2330 10/24/20 0436 10/24/20 2113 10/25/20 0319 10/26/20 0636  WBC 11.7* 11.6* 12.3* 11.3* 9.6  HGB 10.2* 10.5* 9.6* 10.5* 9.7*  HCT 33.5* 34.9* 31.5* 34.4* 32.3*  MCV 71.9* 72.1* 71.6* 71.7* 72.3*  PLT 274 317 368 426* 480*   Cardiac Enzymes: No results for input(s): CKTOTAL, CKMB, CKMBINDEX, TROPONINI in the last 168 hours. BNP: BNP (last 3 results) No results for input(s): BNP in the last 8760 hours.  ProBNP (last 3 results) No results for input(s): PROBNP in the last 8760 hours.  CBG: Recent Labs  Lab 10/25/20 1616 10/25/20 2004 10/25/20 2321 10/26/20 0403 10/26/20 0748  GLUCAP 139* 222* 175* 172* 120*        Signed:  Florencia Reasons MD, PhD, FACP  [facility] 10/26/2020, 9:02 AM

## 2020-10-27 LAB — BPAM RBC
Blood Product Expiration Date: 202201042359
Blood Product Expiration Date: 202201052359
Unit Type and Rh: 5100
Unit Type and Rh: 5100

## 2020-10-27 LAB — TYPE AND SCREEN
ABO/RH(D): O POS
Antibody Screen: NEGATIVE
Unit division: 0
Unit division: 0

## 2020-10-29 LAB — CULTURE, BLOOD (ROUTINE X 2)
Culture: NO GROWTH
Special Requests: ADEQUATE

## 2020-11-14 ENCOUNTER — Other Ambulatory Visit (INDEPENDENT_AMBULATORY_CARE_PROVIDER_SITE_OTHER): Payer: No Typology Code available for payment source

## 2020-11-14 ENCOUNTER — Encounter: Payer: Self-pay | Admitting: Physician Assistant

## 2020-11-14 ENCOUNTER — Ambulatory Visit (INDEPENDENT_AMBULATORY_CARE_PROVIDER_SITE_OTHER): Payer: No Typology Code available for payment source | Admitting: Physician Assistant

## 2020-11-14 VITALS — BP 132/78 | HR 84 | Ht 62.75 in | Wt 245.0 lb

## 2020-11-14 DIAGNOSIS — D649 Anemia, unspecified: Secondary | ICD-10-CM

## 2020-11-14 DIAGNOSIS — K5731 Diverticulosis of large intestine without perforation or abscess with bleeding: Secondary | ICD-10-CM

## 2020-11-14 DIAGNOSIS — Z86010 Personal history of colonic polyps: Secondary | ICD-10-CM

## 2020-11-14 LAB — CBC WITH DIFFERENTIAL/PLATELET
Basophils Absolute: 0.1 10*3/uL (ref 0.0–0.1)
Basophils Relative: 0.9 % (ref 0.0–3.0)
Eosinophils Absolute: 0.7 10*3/uL (ref 0.0–0.7)
Eosinophils Relative: 8.7 % — ABNORMAL HIGH (ref 0.0–5.0)
HCT: 39.1 % (ref 36.0–46.0)
Hemoglobin: 12.4 g/dL (ref 12.0–15.0)
Lymphocytes Relative: 31.1 % (ref 12.0–46.0)
Lymphs Abs: 2.7 10*3/uL (ref 0.7–4.0)
MCHC: 31.7 g/dL (ref 30.0–36.0)
MCV: 70.6 fl — ABNORMAL LOW (ref 78.0–100.0)
Monocytes Absolute: 0.6 10*3/uL (ref 0.1–1.0)
Monocytes Relative: 7 % (ref 3.0–12.0)
Neutro Abs: 4.5 10*3/uL (ref 1.4–7.7)
Neutrophils Relative %: 52.3 % (ref 43.0–77.0)
Platelets: 314 10*3/uL (ref 150.0–400.0)
RBC: 5.53 Mil/uL — ABNORMAL HIGH (ref 3.87–5.11)
RDW: 18.5 % — ABNORMAL HIGH (ref 11.5–15.5)
WBC: 8.6 10*3/uL (ref 4.0–10.5)

## 2020-11-14 NOTE — Progress Notes (Signed)
Subjective:    Patient ID: Carol Mueller, female    DOB: 11/27/1960, 59 y.o.   MRN: [id number]  HPI Carol Mueller is a pleasant 59 year old African-American female, known to Dr. [name] who comes in today for post hospital follow-up after recent hospitalization with diverticular hemorrhage. Patient has history of hypertension, prior ischemic colitis, previous diverticular bleeding, history of Salmonella enteritis, thalassemia, insulin-dependent diabetes mellitus ,prior history of mesenteric vein thrombosis.  Also with history of Cushing's disease status post adrenalectomy. She last underwent colonoscopy in November 2020 with finding of pandiverticulosis, internal hemorrhoids and had 2 polyps removed which were 6 to 8 mm in size.  Path showed 1 of these to be adenomatous and 1 hyperplastic. She had admission in November 2021 with self-limited diverticular hemorrhage.  She did not require transfusion and hemoglobin was 13.7 on discharge.   She required readmission on 10/23/2020 for recurrent acute diverticular hemorrhage.  She had presented after multiple episodes of bloody stools.  Hemoglobin was 12.3 on presentation.  She did undergo nuc med bleeding scan later that same day which was negative.  She did not manifest any further active bleeding.  Hemoglobin did drift to 9.7.  She says she has been doing well since discharge from the hospital and has not had any evidence of melena or hematochezia.  Her energy level has improved.  She has no complaints of abdominal pain or discomfort.  She has not been on any aspirin or NSAIDs. We had discussed surgical consultation while she was in the hospital due to recurrent diverticular bleeding for which she had had 3 episodes in the past year or so.  She did not want to pursue any surgical management at this point.  Review of Systems Pertinent positive and negative review of systems were noted in the above HPI section.  All other review of systems was otherwise  negative.  Outpatient Encounter Medications as of 11/14/2020  Medication Sig  . acetaminophen (TYLENOL) 325 MG tablet Take 2 tablets (650 mg total) by mouth every 6 (six) hours as needed for mild pain (or Fever >/= 101). (Patient taking differently: Take 650 mg by mouth as needed for mild pain (or Fever >/= 101).)  . atorvastatin (LIPITOR) 10 MG tablet Take 10 mg by mouth daily.  . calcium citrate-vitamin D (CITRACAL+D) 315-200 MG-UNIT tablet Take 1 tablet by mouth daily.  . carvedilol (COREG) 12.5 MG tablet Take 12.5 mg by mouth 2 (two) times daily with a meal.   . dicyclomine (BENTYL) 20 MG tablet TAKE 1 TABLET BY MOUTH TWICE A DAY (Patient taking differently: as needed.)  . empagliflozin (JARDIANCE) 10 MG TABS tablet Take 10 mg by mouth daily.  . fluticasone (FLONASE) 50 MCG/ACT nasal spray Place 1 spray into both nostrils daily.  Marland Kitchen glimepiride (AMARYL) 2 MG tablet Take 2 mg by mouth daily.  . Insulin Glargine (BASAGLAR KWIKPEN) 100 UNIT/ML Inject 45 Units into the skin at bedtime.   . metFORMIN (GLUCOPHAGE) 1000 MG tablet Take 500 mg by mouth 2 (two) times daily with a meal.   . Multiple Vitamins-Minerals (MULTIVITAMIN WITH MINERALS) tablet Take 1 tablet by mouth daily.  Marland Kitchen olmesartan (BENICAR) 40 MG tablet Take 40 mg by mouth daily.  . Potassium Chloride ER 20 MEQ TBCR Take 1 tablet by mouth daily.  . ferrous sulfate 325 (65 FE) MG EC tablet Take 1 tablet (325 mg total) by mouth every Monday, Wednesday, and Friday for 14 days.   No facility-administered encounter medications on file as of  11/14/2020.   Allergies  Allergen Reactions  . Norvasc [Amlodipine Besylate] Swelling    ANGIOEDEMA  . Nsaids Other (See Comments)    GI upset/ GI bleed  . Sulfa Antibiotics Other (See Comments)    childhood   Patient Active Problem List   Diagnosis Date Noted  . SIRS (systemic inflammatory response syndrome) (HCC) 10/23/2020  . Acute GI bleeding 10/23/2020  . GIB (gastrointestinal bleeding)  09/23/2020  . Hematochezia 10/03/2019  . Benign neoplasm of transverse colon   . Benign neoplasm of descending colon   . GI bleed 10/02/2019  . Rectal bleeding 09/18/2019  . Fever   . Salmonella enteritis 05/28/2019  . 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 Sepsis (HCC) 05/24/2019  . Hypokalemia 06/02/2018  . Lower GI bleeding 06/02/2018  . Insulin-requiring or dependent type II diabetes mellitus (HCC)   . Back pain with left-sided radiculopathy 09/29/2017  . Diarrhea 12/16/2016  . Lower GI bleed 11/30/2016  . History of GI diverticular bleed   . Diverticulosis of colon with hemorrhage 06/05/2016  . Ischemic colitis (HCC) 08/07/2015  . Abdominal pain   . Tachycardia 07/15/2015  . Polycystic disease, ovaries 09/12/2012  . Class 3 obesity due to excess calories with body mass index (BMI) of 40.0 to 44.9 in adult 09/12/2012  . Essential hypertension 09/12/2012  . Hyperlipidemia with target LDL less than 100 09/12/2012  . 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 Allergic rhinitis 04/06/2012   Social History   Socioeconomic History  . Marital status: Single    Spouse name: N/A  . Number of children: 0  . Years of education: Not on file  . Highest education level: Not on file  Occupational History  . Occupation: NURSE    Comment: [employer]  Tobacco Use  . Smoking status: Never Smoker  . Smokeless tobacco: Never Used  Vaping Use  . Vaping Use: Never used  Substance and Sexual Activity  . Alcohol use: No  . Drug use: No  . Sexual activity: Not Currently    Birth control/protection: Post-menopausal  Other Topics Concern  . Not on file  Social History Narrative   Lives alone. Parents live in [place].   Social Determinants of Health   Financial Resource Strain: Not on file  Food Insecurity: Not on file  Transportation Needs: Not on file  Physical Activity: Not on file  Stress: Not on file  Social Connections: Not on file  Intimate  Partner Violence: Not on file    Carol Mueller's family history includes Breast cancer in her maternal grandmother; Cancer in her mother; Diabetes in her brother, father, paternal grandmother, and sister; Hypertension in her brother, father, maternal grandfather, mother, and sister.      Objective:    Vitals:   11/14/20 0903  BP: 132/78  Pulse: 84    Physical Exam Well-developed well-nourished older AA female  in no acute distress.  Height, Weight, 245 BMI43  HEENT; nontraumatic normocephalic, EOMI, PE R LA, sclera anicteric.  Neuro/Psych; alert and oriented x4, grossly nonfocal mood and affect appropriate       Assessment & Plan:   Number one 59 year old African-American female with history of recurrent diverticular hemorrhage and history of pandiverticulosis who comes in today for post hospital follow-up after recent admission for diverticular hemorrhage. She was anemic on discharge with hemoglobin of 9.7, did not require transfusion and bleed was self-limited. Nuc med bleeding scan done several hours after presentation was negative.  #2 thalassemia trait #3  history of adenomatous and hyperplastic colon polyps-up-to-date with colonoscopy last done November 2020 and indicated for 5 to 7-year interval follow-up 4.  Insulin-dependent diabetes mellitus 5.  Hypertension 6.  History of Cushing's disease status post adrenalectomy  Plan; Repeat CBC with differential today. Discussion with patient again today regarding elective surgical consultation.  She would like to hold off at present which is reasonable.  If she continues to have frequent episodes of diverticular bleeding can reconsider in the upcoming months. She will follow up with myself or Dr. [name] on an as-needed basis and knows to call us for any issues with recurrent bleeding.  Carol Mueller S Taedyn Glasscock PA-C 11/14/2020   Cc: [name], DO

## 2020-11-14 NOTE — Patient Instructions (Addendum)
If you are age 60 or older, your body mass index should be between 23-30. Your Body mass index is 43.75 kg/m. If this is out of the aforementioned range listed, please consider follow up with your Primary Care Provider.  If you are age 64 or younger, your body mass index should be between 19-25. Your Body mass index is 43.75 kg/m. If this is out of the aformentioned range listed, please consider follow up with your Primary Care Provider.   Your provider has requested that you go to the basement level for lab work before leaving today. Press "B" on the elevator. The lab is located at the first door on the left as you exit the elevator.  Follow up with Amy or Dr. Christella Hartigan as needed.  Avoid Asprin and NSAIDS.  Thank you for entrusting me with your care and choosing [facility].  Amy Esterwood, PA-C

## 2020-11-15 NOTE — Progress Notes (Signed)
I agree with the above note, plan 

## 2020-11-19 ENCOUNTER — Other Ambulatory Visit: Payer: Self-pay

## 2020-11-19 ENCOUNTER — Encounter (HOSPITAL_COMMUNITY): Payer: Self-pay | Admitting: Emergency Medicine

## 2020-11-19 ENCOUNTER — Telehealth: Payer: Self-pay | Admitting: Physician Assistant

## 2020-11-19 ENCOUNTER — Inpatient Hospital Stay (HOSPITAL_COMMUNITY)
Admission: EM | Admit: 2020-11-19 | Discharge: 2020-11-23 | DRG: 378 | Disposition: A | Discharge status: Home / Self Care | Payer: No Typology Code available for payment source | Attending: Internal Medicine | Admitting: Internal Medicine

## 2020-11-19 DIAGNOSIS — K219 Gastro-esophageal reflux disease without esophagitis: Secondary | ICD-10-CM

## 2020-11-19 DIAGNOSIS — Z7984 Long term (current) use of oral hypoglycemic drugs: Secondary | ICD-10-CM

## 2020-11-19 DIAGNOSIS — Z888 Allergy status to other drugs, medicaments and biological substances status: Secondary | ICD-10-CM

## 2020-11-19 DIAGNOSIS — K5731 Diverticulosis of large intestine without perforation or abscess with bleeding: Principal | ICD-10-CM | POA: Diagnosis present

## 2020-11-19 DIAGNOSIS — K5791 Diverticulosis of intestine, part unspecified, without perforation or abscess with bleeding: Secondary | ICD-10-CM | POA: Diagnosis not present

## 2020-11-19 DIAGNOSIS — Z882 Allergy status to sulfonamides status: Secondary | ICD-10-CM

## 2020-11-19 DIAGNOSIS — K922 Gastrointestinal hemorrhage, unspecified: Secondary | ICD-10-CM | POA: Diagnosis not present

## 2020-11-19 DIAGNOSIS — K648 Other hemorrhoids: Secondary | ICD-10-CM | POA: Diagnosis present

## 2020-11-19 DIAGNOSIS — Z79899 Other long term (current) drug therapy: Secondary | ICD-10-CM

## 2020-11-19 DIAGNOSIS — K635 Polyp of colon: Secondary | ICD-10-CM | POA: Diagnosis present

## 2020-11-19 DIAGNOSIS — E1165 Type 2 diabetes mellitus with hyperglycemia: Secondary | ICD-10-CM

## 2020-11-19 DIAGNOSIS — Z794 Long term (current) use of insulin: Secondary | ICD-10-CM

## 2020-11-19 DIAGNOSIS — Z6841 Body Mass Index (BMI) 40.0 and over, adult: Secondary | ICD-10-CM

## 2020-11-19 DIAGNOSIS — D62 Acute posthemorrhagic anemia: Secondary | ICD-10-CM | POA: Diagnosis present

## 2020-11-19 DIAGNOSIS — Z20822 Contact with and (suspected) exposure to covid-19: Secondary | ICD-10-CM | POA: Diagnosis present

## 2020-11-19 DIAGNOSIS — E249 Cushing's syndrome, unspecified: Secondary | ICD-10-CM

## 2020-11-19 DIAGNOSIS — E785 Hyperlipidemia, unspecified: Secondary | ICD-10-CM | POA: Diagnosis present

## 2020-11-19 DIAGNOSIS — E1159 Type 2 diabetes mellitus with other circulatory complications: Secondary | ICD-10-CM | POA: Diagnosis present

## 2020-11-19 DIAGNOSIS — D121 Benign neoplasm of appendix: Secondary | ICD-10-CM

## 2020-11-19 DIAGNOSIS — E119 Type 2 diabetes mellitus without complications: Secondary | ICD-10-CM

## 2020-11-19 DIAGNOSIS — E1169 Type 2 diabetes mellitus with other specified complication: Secondary | ICD-10-CM

## 2020-11-19 DIAGNOSIS — E282 Polycystic ovarian syndrome: Secondary | ICD-10-CM | POA: Diagnosis present

## 2020-11-19 DIAGNOSIS — Z886 Allergy status to analgesic agent status: Secondary | ICD-10-CM

## 2020-11-19 DIAGNOSIS — I10 Essential (primary) hypertension: Secondary | ICD-10-CM | POA: Diagnosis present

## 2020-11-19 DIAGNOSIS — K30 Functional dyspepsia: Secondary | ICD-10-CM | POA: Diagnosis present

## 2020-11-19 LAB — COMPREHENSIVE METABOLIC PANEL
ALT: 16 U/L (ref 0–44)
AST: 15 U/L (ref 15–41)
Albumin: 4.5 g/dL (ref 3.5–5.0)
Alkaline Phosphatase: 53 U/L (ref 38–126)
Anion gap: 9 (ref 5–15)
BUN: 23 mg/dL — ABNORMAL HIGH (ref 6–20)
CO2: 27 mmol/L (ref 22–32)
Calcium: 9.6 mg/dL (ref 8.9–10.3)
Chloride: 103 mmol/L (ref 98–111)
Creatinine, Ser: 1 mg/dL (ref 0.44–1.00)
GFR, Estimated: >60 mL/min (ref >60)
Glucose, Bld: 156 mg/dL — ABNORMAL HIGH (ref 70–99)
Potassium: 4.2 mmol/L (ref 3.5–5.1)
Sodium: 139 mmol/L (ref 135–145)
Total Bilirubin: 0.7 mg/dL (ref 0.3–1.2)
Total Protein: 8.6 g/dL — ABNORMAL HIGH (ref 6.5–8.1)

## 2020-11-19 LAB — CBC
HCT: 42 % (ref 36.0–46.0)
Hemoglobin: 12.6 g/dL (ref 12.0–15.0)
MCH: 22.6 pg — ABNORMAL LOW (ref 26.0–34.0)
MCHC: 30 g/dL (ref 30.0–36.0)
MCV: 75.3 fL — ABNORMAL LOW (ref 80.0–100.0)
Platelets: 283 10*3/uL (ref 150–400)
RBC: 5.58 MIL/uL — ABNORMAL HIGH (ref 3.87–5.11)
RDW: 19.2 % — ABNORMAL HIGH (ref 11.5–15.5)
WBC: 11.3 10*3/uL — ABNORMAL HIGH (ref 4.0–10.5)
nRBC: 0 % (ref 0.0–0.2)

## 2020-11-19 LAB — POC OCCULT BLOOD, ED: Fecal Occult Bld: POSITIVE — AB

## 2020-11-19 LAB — RESP PANEL BY RT-PCR (FLU A&B, COVID) ARPGX2
Influenza A by PCR: NEGATIVE
Influenza B by PCR: NEGATIVE
SARS Coronavirus 2 by RT PCR: NEGATIVE

## 2020-11-19 LAB — HEMOGLOBIN AND HEMATOCRIT, BLOOD
HCT: 39.1 % (ref 36.0–46.0)
Hemoglobin: 11.9 g/dL — ABNORMAL LOW (ref 12.0–15.0)

## 2020-11-19 LAB — I-STAT BETA HCG BLOOD, ED (MC, WL, AP ONLY): I-stat hCG, quantitative: 9.6 m[IU]/mL — ABNORMAL HIGH (ref <5)

## 2020-11-19 LAB — CBG MONITORING, ED: Glucose-Capillary: 116 mg/dL — ABNORMAL HIGH (ref 70–99)

## 2020-11-19 MED ORDER — INSULIN ASPART 100 UNIT/ML ~~LOC~~ SOLN
0.0000 [IU] | Freq: Every day | SUBCUTANEOUS | Status: DC
  Filled 2020-11-19: qty 0.05

## 2020-11-19 MED ORDER — ONDANSETRON HCL 4 MG PO TABS: 4.0000 mg | ORAL_TABLET | Freq: Four times a day (QID) | ORAL | Status: DC | PRN

## 2020-11-19 MED ORDER — INSULIN ASPART 100 UNIT/ML ~~LOC~~ SOLN
0.0000 [IU] | Freq: Three times a day (TID) | SUBCUTANEOUS | Status: DC
  Administered 2020-11-20 (×3): 2 [IU] via SUBCUTANEOUS
  Administered 2020-11-21: 5 [IU] via SUBCUTANEOUS
  Administered 2020-11-21 (×2): 3 [IU] via SUBCUTANEOUS
  Administered 2020-11-22: 2 [IU] via SUBCUTANEOUS
  Administered 2020-11-22: 3 [IU] via SUBCUTANEOUS
  Administered 2020-11-23 (×2): 2 [IU] via SUBCUTANEOUS
  Filled 2020-11-19: qty 0.15

## 2020-11-19 MED ORDER — INSULIN GLARGINE 100 UNIT/ML ~~LOC~~ SOLN
20.0000 [IU] | Freq: Every day | SUBCUTANEOUS | Status: DC
  Administered 2020-11-19 – 2020-11-22 (×4): 20 [IU] via SUBCUTANEOUS
  Filled 2020-11-19 (×5): qty 0.2

## 2020-11-19 MED ORDER — ACETAMINOPHEN 650 MG RE SUPP: 650.0000 mg | Freq: Four times a day (QID) | RECTAL | Status: DC | PRN

## 2020-11-19 MED ORDER — ACETAMINOPHEN 325 MG PO TABS: 650.0000 mg | ORAL_TABLET | Freq: Four times a day (QID) | ORAL | Status: DC | PRN

## 2020-11-19 MED ORDER — ONDANSETRON HCL 4 MG/2ML IJ SOLN: 4.0000 mg | Freq: Four times a day (QID) | INTRAMUSCULAR | Status: DC | PRN

## 2020-11-19 MED ORDER — LACTATED RINGERS IV SOLN: INTRAVENOUS | Status: DC

## 2020-11-19 MED ORDER — CARVEDILOL 25 MG PO TABS
25.0000 mg | ORAL_TABLET | Freq: Two times a day (BID) | ORAL | Status: DC
Start: 2020-11-19 — End: 2020-11-20
  Administered 2020-11-19: 25 mg via ORAL
  Filled 2020-11-19 (×4): qty 1

## 2020-11-19 MED ORDER — ATORVASTATIN CALCIUM 10 MG PO TABS
10.0000 mg | ORAL_TABLET | Freq: Every day | ORAL | Status: DC
  Administered 2020-11-19 – 2020-11-22 (×4): 10 mg via ORAL
  Filled 2020-11-19 (×4): qty 1

## 2020-11-19 MED ORDER — DICYCLOMINE HCL 20 MG PO TABS
20.0000 mg | ORAL_TABLET | Freq: Two times a day (BID) | ORAL | Status: DC
  Administered 2020-11-19 – 2020-11-23 (×8): 20 mg via ORAL
  Filled 2020-11-19 (×8): qty 1

## 2020-11-19 NOTE — Telephone Encounter (Signed)
Spoke with the patient. She is at the ED presently. She has had labs drawn. She tells me she has not passed blood from her rectum in about 2 hours. She has been NPO for about the same. She states she does not feel well and agrees to stay where she is.

## 2020-11-19 NOTE — ED Notes (Signed)
Report called to [facility], pt will be transferred to the floor.

## 2020-11-19 NOTE — ED Notes (Signed)
Hospitalist at bedside 

## 2020-11-19 NOTE — ED Provider Notes (Signed)
[place] DEPT Provider Note   CSN: [id number] Arrival date & time: 11/19/20  1014     History Chief Complaint  Patient presents with  . Rectal Bleeding    Carol Mueller is a 60 y.o. 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  She states she has had 6 grossly bloody bowel movements since symptom onset.  She has cramping abdominal pain prior to having a bowel movement though no other abdominal pain.  She is now having any nausea or vomiting.  She is starting to feel little bit dizzy and fatigued.  She is not on anticoagulation.  This is a recurrent problem, followed by [facility].  She called the GI office today due to long wait times, they are aware of her presence in the ED.  No fevers.   Per chart review, patient last had admission on October 23, 2020 for GI bleed.  The history is provided by the patient and medical records.       Past Medical History:  Diagnosis Date  . Anemia   . Cushing's syndrome ([place])    due to adrenal tumor  . Diabetes mellitus without complication (HCC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  . Diverticulitis   . Diverticulosis   . GI bleed   . Hyperlipidemia   . Hypertension   . Polycystic ovary disease   . Sepsis ([place]) 05/2019    Patient Active Problem List   Diagnosis Date Noted  . SIRS (systemic inflammatory response syndrome) ([place]) 10/23/2020  . Acute GI bleeding 10/23/2020  . GIB (gastrointestinal bleeding) 09/23/2020  . Hematochezia 10/03/2019  . Benign neoplasm of transverse colon   . Benign neoplasm of descending colon   . GI bleed 10/02/2019  . Rectal bleeding 09/18/2019  . Fever   . Salmonella enteritis 05/28/2019  . 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 Sepsis ([place]) 05/24/2019  . Hypokalemia 06/02/2018  . Lower GI bleeding 06/02/2018  . Insulin-requiring or dependent type II diabetes mellitus ([place])   . Back pain with left-sided radiculopathy 09/29/2017  . Diarrhea 12/16/2016  . Lower GI bleed 11/30/2016  . History of GI diverticular bleed   . Diverticulosis of colon with hemorrhage 06/05/2016  . Ischemic colitis ([place]) 08/07/2015  . Abdominal pain   . Tachycardia 07/15/2015  . Polycystic disease, ovaries 09/12/2012  . Class 3 obesity due to excess calories with body mass index (BMI) of 40.0 to 44.9 in adult 09/12/2012  . Essential hypertension 09/12/2012  . Hyperlipidemia with target LDL less than 100 09/12/2012  . 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 ([place]) 09/12/2012  . Allergic rhinitis 04/06/2012    Past Surgical History:  Procedure Laterality Date  . ADRENALECTOMY  1996  . BREAST BIOPSY    . CHOLECYSTECTOMY    . COLONOSCOPY WITH PROPOFOL N/A 10/03/2019   Procedure: COLONOSCOPY WITH PROPOFOL;  Surgeon: Ladene Artist, MD;  Location: [facility];  Service: Endoscopy;  Laterality: N/A;  . ESOPHAGOGASTRODUODENOSCOPY (EGD) WITH PROPOFOL N/A 07/16/2015   Procedure: ESOPHAGOGASTRODUODENOSCOPY (EGD) WITH PROPOFOL;  Surgeon: [name], MD;  Location: [facility];  Service: Endoscopy;  Laterality: N/A;  . POLYPECTOMY  10/03/2019   Procedure: POLYPECTOMY;  Surgeon: Ladene Artist, MD;  Location: [facility];  Service: Endoscopy;;  OB History   No obstetric history on file.     Family History  Problem Relation Age of Onset  . Hypertension Mother   . Cancer Mother        ?uterine or ovarian cancer  . Hypertension Father   . Diabetes Father   . Hypertension Sister   . Diabetes Sister   . Hypertension Brother   . Diabetes Brother   . Hypertension Maternal Grandfather   . Diabetes Paternal Grandmother   . Breast cancer Maternal Grandmother   . Colon cancer Neg Hx     Social History    Tobacco Use  . Smoking status: Never Smoker  . Smokeless tobacco: Never Used  Vaping Use  . Vaping Use: Never used  Substance Use Topics  . Alcohol use: No  . Drug use: No    Home Medications Prior to Admission medications   Medication Sig Start Date End Date Taking? Authorizing Provider  acetaminophen (TYLENOL) 325 MG tablet Take 2 tablets (650 mg total) by mouth every 6 (six) hours as needed for mild pain (or Fever >/= 101). Patient taking differently: Take 650 mg by mouth as needed for mild pain (or Fever >/= 101). 09/19/19  Yes [name] I, MD  atorvastatin (LIPITOR) 10 MG tablet Take 10 mg by mouth daily. 03/06/19  Yes [provider]  calcium citrate-vitamin D (CITRACAL+D) 315-200 MG-UNIT tablet Take 1 tablet by mouth daily.   Yes [provider]  carvedilol (COREG) 25 MG tablet Take 25 mg by mouth 2 (two) times daily. 10/30/20  Yes [provider]  dicyclomine (BENTYL) 20 MG tablet TAKE 1 TABLET BY MOUTH TWICE A DAY Patient taking differently: Take 20 mg by mouth as needed for spasms. 06/02/17  Yes [name], MD  empagliflozin (JARDIANCE) 10 MG TABS tablet Take 10 mg by mouth daily.   Yes [provider]  glimepiride (AMARYL) 2 MG tablet Take 2 mg by mouth daily. 02/23/20  Yes [provider]  Insulin Glargine (BASAGLAR KWIKPEN) 100 UNIT/ML Inject 45 Units into the skin at bedtime.  02/20/20  Yes [provider]  metFORMIN (GLUCOPHAGE) 1000 MG tablet Take 500 mg by mouth 2 (two) times daily with a meal.  01/11/20  Yes [provider]  Multiple Vitamins-Minerals (MULTIVITAMIN WITH MINERALS) tablet Take 1 tablet by mouth daily.   Yes [provider]  olmesartan (BENICAR) 40 MG tablet Take 40 mg by mouth daily. 10/11/19  Yes [provider]  Potassium Chloride ER 20 MEQ TBCR Take 20 mEq by mouth daily. 10/30/20  Yes [provider]  ferrous sulfate 325 (65 FE) MG EC tablet Take 1 tablet  (325 mg total) by mouth every Monday, Wednesday, and Friday for 14 days. Patient not taking: Reported on 11/19/2020 10/28/20 11/11/20  Florencia Reasons, MD    Allergies    Norvasc [amlodipine besylate], Nsaids, and Sulfa antibiotics  Review of Systems   Review of Systems  Constitutional: Positive for fatigue.  Gastrointestinal: Positive for abdominal pain and blood in stool.  All other systems reviewed and are negative.   Physical Exam Updated Vital Signs BP 101/69   Pulse 86   Temp 99.2 F (37.3 C) (Oral)   Resp 18   SpO2 96%   Physical Exam Vitals and nursing note reviewed.  Constitutional:      General: She is not in acute distress.    Appearance: She is well-developed and well-nourished.  HENT:     Head: Normocephalic and atraumatic.  Eyes:     Conjunctiva/sclera: Conjunctivae normal.  Cardiovascular:     Rate and Rhythm: Normal rate and regular rhythm.  Pulmonary:     Effort: Pulmonary effort is normal.     Breath sounds: Normal breath sounds.  Abdominal:     General: Bowel sounds are normal.     Palpations: Abdomen is soft.     Tenderness: There is no abdominal tenderness.  Genitourinary:    Comments: Rectal exam performed with female RN Sonya chaperone present.  Small amount of dark red blood present.  No tenderness. Skin:    General: Skin is warm.  Neurological:     Mental Status: She is alert.  Psychiatric:        Mood and Affect: Mood and affect normal.        Behavior: Behavior normal.     ED Results / Procedures / Treatments   Labs (all labs ordered are listed, but only abnormal results are displayed) Labs Reviewed  COMPREHENSIVE METABOLIC PANEL - Abnormal; Notable for the following components:      Result Value   Glucose, Bld 156 (*)    BUN 23 (*)    Total Protein 8.6 (*)    All other components within normal limits  CBC - Abnormal; Notable for the following components:   WBC 11.3 (*)    RBC 5.58 (*)    MCV 75.3 (*)    MCH 22.6 (*)    RDW 19.2  (*)    All other components within normal limits  POC OCCULT BLOOD, ED - Abnormal; Notable for the following components:   Fecal Occult Bld POSITIVE (*)    All other components within normal limits  I-STAT BETA HCG BLOOD, ED ([facility], [facility], [facility] ONLY) - Abnormal; Notable for the following components:   I-stat hCG, quantitative 9.6 (*)    All other components within normal limits  HEMOGLOBIN AND HEMATOCRIT, BLOOD  TYPE AND SCREEN    EKG None  Radiology No results found.  Procedures Procedures (including critical care time)  Medications Ordered in ED Medications - No data to display  ED Course  I have reviewed the triage vital signs and the nursing notes.  Pertinent labs & imaging results that were available during my care of the patient were reviewed by me and considered in my medical decision making (see chart for details).    MDM Rules/Calculators/A&P                         Patient with history of recurrent diverticular hemorrhage and colonic polyps, presenting with bright red blood per rectum that began this morning around 7 AM.  Has had 6 grossly bloody stools since this morning.  On exam she has small amount of dark red blood per rectum with positive Hemoccult.  Initial he H&H obtained in triage 5 hours prior to evaluation is 12.6 and 42, repeat H&H ordered.  Vital signs stable at this time.  Will consult GI.   Final Clinical Impression(s) / ED Diagnoses Final diagnoses:  None    Rx / DC Orders ED Discharge Orders    None       Taler Kushner, [place] N, PA-C 11/19/20 1530    Tegeler, Canary Brim, MD 11/19/20 [phone]

## 2020-11-19 NOTE — ED Triage Notes (Signed)
Per pt, states she is having a lower GI bleed-recently hospitalized for the same-states history of diverticulitis

## 2020-11-19 NOTE — Telephone Encounter (Signed)
Pt is requesting a call back from a nurse to discuss a GI bleed she is experiencing, pt states she is at the ED but the wait is 20 hours. Pt would like some advice on what to do.

## 2020-11-19 NOTE — H&P (Signed)
[facility] History and Physical   Patient: Carol Mueller FAO:[id number]   PCP: [name], DO DOB: 1961-05-13   DOA: 11/19/2020   DOS: 11/19/2020   DOS: the patient was seen and examined on 11/19/2020  Patient coming from: The patient is coming from Home  Chief Complaint: 6 bowel movements with blood  HPI: Carol Mueller is a 60 y.o. female with Past medical history of Cushing syndrome, type II DM, diverticulitis/diverticulosis, HLD, HTN, PCOD. Patient presents with complaints of painless 6 bloody bowel movement so far. Patient mentioned that she was at her baseline earlier this morning. Noticed at 7 AM that she had a bowel movement with gross blood.  Following 5 days she had 5 more bowel movement with gross blood.  She had some dizziness and fatigue.  No chest pain.  No abdominal pain.  No nausea no vomiting.  No fever no chills.  No diarrhea otherwise prior to this admission.  No change in medication.  Does not take any blood thinners or NSAIDs.  ED Course: Presents with above complaint.  Hemoccult positive.  Had one episode of BRBPR which is improving.  No clots.  Discussed with GI.  Recommended admission for observation.  Review of Systems: as mentioned in the history of present illness.  All other systems reviewed and are negative.  Past Medical History:  Diagnosis Date  . Anemia   . 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  . Diabetes mellitus without complication (HCC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  . Diverticulitis   . Diverticulosis   . GI bleed   . Hyperlipidemia   . Hypertension   . Polycystic ovary disease   . Sepsis (H[facility]) 05/2019   Past Surgical History:  Procedure Laterality Date  . ADRENALECTOMY  1996  . BREAST BIOPSY    . CHOLECYSTECTOMY    . COLONOSCOPY WITH PROPOFOL N/A 10/03/2019   Procedure: COLONOSCOPY WITH PROPOFOL;  Surgeon: Meryl Dare, MD;  [facility];  Service: Endoscopy;  Laterality: N/A;  . ESOPHAGOGASTRODUODENOSCOPY (EGD)  WITH PROPOFOL N/A 07/16/2015   Procedure: ESOPHAGOGASTRODUODENOSCOPY (EGD) WITH PROPOFOL;  Surgeon: Rachael Fee, MD;  [facility];  Service: Endoscopy;  Laterality: N/A;  . POLYPECTOMY  10/03/2019   Procedure: POLYPECTOMY;  Surgeon: Meryl Dare, MD;  [facility];  Service: Endoscopy;;   Social History:  reports that she has never smoked. She has never used smokeless tobacco. She reports that she does not drink alcohol and does not use drugs.  Allergies  Allergen Reactions  . Norvasc [Amlodipine Besylate] Swelling    ANGIOEDEMA  . Nsaids Other (See Comments)    GI upset/ GI bleed  . Sulfa Antibiotics Other (See Comments)    childhood   Family history reviewed and not pertinent Family History  Problem Relation Age of Onset  . Hypertension Mother   . Cancer Mother        ?uterine or ovarian cancer  . Hypertension Father   . Diabetes Father   . Hypertension Sister   . Diabetes Sister   . Hypertension Brother   . Diabetes Brother   . Hypertension Maternal Grandfather   . Diabetes Paternal Grandmother   . Breast cancer Maternal Grandmother   . Colon cancer Neg Hx      Prior to Admission medications   Medication Sig Start Date End Date Taking? Authorizing Provider  acetaminophen (TYLENOL) 325 MG tablet Take 2 tablets (650 mg total) by mouth every 6 (six) hours  as needed for mild pain (or Fever >/= 101). Patient taking differently: Take 650 mg by mouth as needed for mild pain (or Fever >/= 101). 09/19/19  Yes [name] I, MD  atorvastatin (LIPITOR) 10 MG tablet Take 10 mg by mouth daily. 03/06/19  Yes [provider]  calcium citrate-vitamin D (CITRACAL+D) 315-200 MG-UNIT tablet Take 1 tablet by mouth daily.   Yes [provider]  carvedilol (COREG) 25 MG tablet Take 25 mg by mouth 2 (two) times daily. 10/30/20  Yes [provider]  dicyclomine (BENTYL) 20 MG tablet TAKE 1 TABLET BY MOUTH TWICE A DAY Patient taking  differently: Take 20 mg by mouth as needed for spasms. 06/02/17  Yes [name], MD  empagliflozin (JARDIANCE) 10 MG TABS tablet Take 10 mg by mouth daily.   Yes [provider]  glimepiride (AMARYL) 2 MG tablet Take 2 mg by mouth daily. 02/23/20  Yes [provider]  Insulin Glargine (BASAGLAR KWIKPEN) 100 UNIT/ML Inject 45 Units into the skin at bedtime.  02/20/20  Yes [provider]  metFORMIN (GLUCOPHAGE) 1000 MG tablet Take 500 mg by mouth 2 (two) times daily with a meal.  01/11/20  Yes [provider]  Multiple Vitamins-Minerals (MULTIVITAMIN WITH MINERALS) tablet Take 1 tablet by mouth daily.   Yes [provider]  olmesartan (BENICAR) 40 MG tablet Take 40 mg by mouth daily. 10/11/19  Yes [provider]  Potassium Chloride ER 20 MEQ TBCR Take 20 mEq by mouth daily. 10/30/20  Yes [provider]  ferrous sulfate 325 (65 FE) MG EC tablet Take 1 tablet (325 mg total) by mouth every Monday, Wednesday, and Friday for 14 days. Patient not taking: Reported on 11/19/2020 10/28/20 11/11/20  Florencia Reasons, MD    Physical Exam: Vitals:   11/19/20 1800 11/19/20 1830 11/19/20 1900 11/19/20 1950  BP: (!) 101/53 107/64 103/64 136/76  Pulse: 85 77 74 80  Resp:    16  Temp:    98.3 F (36.8 C)  TempSrc:    Oral  SpO2: 97% 96% 94% 98%    General: alert and oriented to time, place, and person. Appear in mild distress, affect appropriate Eyes: PERRL, Conjunctiva normal ENT: Oral Mucosa Clear, moist  Neck: no JVD, no Abnormal Mass Or lumps Cardiovascular: S1 and S2 Present, no Murmur, peripheral pulses symmetrical Respiratory: good respiratory effort, Bilateral Air entry equal and Decreased, no of accessory muscle use, Clear to Auscultation, no Crackles, no wheezes Abdomen: Bowel Sound present, Soft and left lower quadrant tenderness, no hernia Skin: no rashes  Extremities: no Pedal edema, no calf tenderness Neurologic: without any new  focal findings Gait not checked due to patient safety concerns  Data Reviewed: I have personally reviewed and interpreted labs, imaging as discussed below.  CBC: Recent Labs  Lab 11/14/20 0943 11/19/20 1314 11/19/20 1559  WBC 8.6 11.3*  --   NEUTROABS 4.5  --   --   HGB 12.4 12.6 11.9*  HCT 39.1 42.0 39.1  MCV 70.6* 75.3*  --   PLT 314.0 283  --    Basic Metabolic Panel: Recent Labs  Lab 11/19/20 1314  NA 139  K 4.2  CL 103  CO2 27  GLUCOSE 156*  BUN 23*  CREATININE 1.00  CALCIUM 9.6   GFR: Estimated Creatinine Clearance: 71.3 mL/min (by C-G formula based on SCr of 1 mg/dL). Liver Function Tests: Recent Labs  Lab 11/19/20 1314  AST 15  ALT 16  ALKPHOS  53  BILITOT 0.7  PROT 8.6*  ALBUMIN 4.5   No results for input(s): LIPASE, AMYLASE in the last 168 hours. No results for input(s): AMMONIA in the last 168 hours. Coagulation Profile: No results for input(s): INR, PROTIME in the last 168 hours. Cardiac Enzymes: No results for input(s): CKTOTAL, CKMB, CKMBINDEX, TROPONINI in the last 168 hours. BNP (last 3 results) No results for input(s): PROBNP in the last 8760 hours. HbA1C: No results for input(s): HGBA1C in the last 72 hours. CBG: Recent Labs  Lab 11/19/20 1602  GLUCAP 116*   Lipid Profile: No results for input(s): CHOL, HDL, LDLCALC, TRIG, CHOLHDL, LDLDIRECT in the last 72 hours. Thyroid Function Tests: No results for input(s): TSH, T4TOTAL, FREET4, T3FREE, THYROIDAB in the last 72 hours. Anemia Panel: No results for input(s): VITAMINB12, FOLATE, FERRITIN, TIBC, IRON, RETICCTPCT in the last 72 hours. Urine analysis:    Component Value Date/Time   COLORURINE YELLOW 10/25/2020 1109   APPEARANCEUR CLEAR 10/25/2020 1109   LABSPEC 1.018 10/25/2020 1109   PHURINE 5.0 10/25/2020 1109   GLUCOSEU >=500 (A) 10/25/2020 1109   HGBUR NEGATIVE 10/25/2020 1109   [place] 10/25/2020 1109   BILIRUBINUR neg 08/25/2014 1157   KETONESUR 20 (A)  10/25/2020 1109   PROTEINUR NEGATIVE 10/25/2020 1109   UROBILINOGEN 0.2 08/25/2014 1157   NITRITE NEGATIVE 10/25/2020 1109   LEUKOCYTESUR NEGATIVE 10/25/2020 1109    Radiological Exams on Admission: No results found.  I reviewed all nursing notes, pharmacy notes, vitals, pertinent old records.  Assessment/Plan 1.  Recurrent lower GI bleed Presents with 6 episodes of BRBPR. So far hemoglobin stable. Has mild left lower quadrant pain. Prior history of diverticular bleed. Seen by GI recently.  Recommended to have surgical consultation if continues to have ongoing bleeding. Bleeding scan was negative last admission. Currently the patient appears to be having slowing of her bleeding and therefore a bleeding scan will not be useful for now. Stat bleeding scan in case patient has bleeding episode. GI will see the patient in the morning.  2.  Essential hypertension Blood pressure stable. Continue to monitor. Holding Benicar, continue Coreg.  3.  Hyperlipidemia Continue statin  4.  Type 2 insulin-dependent diabetes mellitus, uncontrolled with hyperglycemia with HLD Hemoglobin A1c 8.4. Continuing sliding scale insulin. Insulin Lantus dose reduced to 20 units.  At home basalgar 45 units.  5.  Cushing's disease Has been lymphadenectomy. Currently not on the medication.  6.  Morbid obesity. BMI 40+. Placing the patient at high risk for poor outcomes.  Nutrition: Clear liquid diet DVT Prophylaxis: SCD, pharmacological prophylaxis contraindicated due to GI bleed  Advance goals of care discussion: Full code   Consults: EDP discussed with [facility].  Family Communication: no family was present at bedside, at the time of interview.   Disposition:  From: Home Likely will need Home on discharge.   Author: Berle Mull, MD [facility] 11/19/2020 8:23 PM   To reach On-call, see care teams to locate the attending and reach out to them via [email or web address]. If 7PM-7AM,  please contact night-coverage If you still have difficulty reaching the attending provider, please page the [facility] (Director on Call) for [facility] on amion for assistance.

## 2020-11-20 DIAGNOSIS — K5731 Diverticulosis of large intestine without perforation or abscess with bleeding: Secondary | ICD-10-CM | POA: Diagnosis not present

## 2020-11-20 DIAGNOSIS — K922 Gastrointestinal hemorrhage, unspecified: Secondary | ICD-10-CM | POA: Diagnosis not present

## 2020-11-20 DIAGNOSIS — K921 Melena: Secondary | ICD-10-CM

## 2020-11-20 DIAGNOSIS — D62 Acute posthemorrhagic anemia: Secondary | ICD-10-CM

## 2020-11-20 LAB — GLUCOSE, CAPILLARY
Glucose-Capillary: 130 mg/dL — ABNORMAL HIGH (ref 70–99)
Glucose-Capillary: 143 mg/dL — ABNORMAL HIGH (ref 70–99)
Glucose-Capillary: 148 mg/dL — ABNORMAL HIGH (ref 70–99)
Glucose-Capillary: 135 mg/dL — ABNORMAL HIGH (ref 70–99)
Glucose-Capillary: 199 mg/dL — ABNORMAL HIGH (ref 70–99)

## 2020-11-20 LAB — CBC
HCT: 35.8 % — ABNORMAL LOW (ref 36.0–46.0)
Hemoglobin: 11 g/dL — ABNORMAL LOW (ref 12.0–15.0)
MCH: 22.4 pg — ABNORMAL LOW (ref 26.0–34.0)
MCHC: 30.7 g/dL (ref 30.0–36.0)
MCV: 73.1 fL — ABNORMAL LOW (ref 80.0–100.0)
Platelets: 253 10*3/uL (ref 150–400)
RBC: 4.9 MIL/uL (ref 3.87–5.11)
RDW: 17.9 % — ABNORMAL HIGH (ref 11.5–15.5)
WBC: 9.2 10*3/uL (ref 4.0–10.5)
nRBC: 0 % (ref 0.0–0.2)

## 2020-11-20 LAB — COMPREHENSIVE METABOLIC PANEL
ALT: 16 U/L (ref 0–44)
AST: 17 U/L (ref 15–41)
Albumin: 4.1 g/dL (ref 3.5–5.0)
Alkaline Phosphatase: 49 U/L (ref 38–126)
Anion gap: 10 (ref 5–15)
BUN: 23 mg/dL — ABNORMAL HIGH (ref 6–20)
CO2: 24 mmol/L (ref 22–32)
Calcium: 9 mg/dL (ref 8.9–10.3)
Chloride: 100 mmol/L (ref 98–111)
Creatinine, Ser: 0.91 mg/dL (ref 0.44–1.00)
GFR, Estimated: >60 mL/min (ref >60)
Glucose, Bld: 152 mg/dL — ABNORMAL HIGH (ref 70–99)
Potassium: 4.1 mmol/L (ref 3.5–5.1)
Sodium: 134 mmol/L — ABNORMAL LOW (ref 135–145)
Total Bilirubin: 0.6 mg/dL (ref 0.3–1.2)
Total Protein: 7.9 g/dL (ref 6.5–8.1)

## 2020-11-20 NOTE — Hospital Course (Addendum)
Carol Mueller is a 61 yo female with PMH diverticulosis, Cushing syndrome (s/p open adrenalectomy), type 2 diabetes, hyperlipidemia, hypertension, PCOD who presented to the hospital with recurrent bright red blood per rectum.  This has been a similar problem in the past associated with diverticulosis.  She has been recommended to have outpatient evaluation with general surgery to discuss elective subtotal colectomy, however she has been against this due to difficulty locating source of her bleeding. She was admitted for further hemoglobin monitoring and possible need for inpatient surgical evaluation vs bleeding scan if able to locate source.   Hgb had been stable until 11/21/2020 when she started having recurrent multiple bloody bowel movements. General surgery was consulted in case of need for emergent intervention and she was scheduled for undergoing repeat colonoscopy with GI on 11/22/2020. She required 2 units PRBC during hospitalization for hemoglobin that hit a nadir of 6.8 g/dL which was down from her admission hemoglobin of 12.6 g/dL. Despite her multiple bloody bowel movements during hospitalization, colonoscopy was unrevealing and did not show any obvious source of her bleed. Etiology was still considered intermittent diverticular bleeding. General surgery was consulted during hospitalization but given no emergent indication for surgery, she was recommended for outpatient follow-up for any further discussion of elective surgery especially given inability to locate her ongoing bleeding source with associated severe anemia. She was monitored for approximately another 24 hours after her colonoscopy and had no further rectal bleeding and hemoglobin remained stable after her blood transfusions. Hemoglobin was 8.7 g/dL at time of discharge.

## 2020-11-20 NOTE — Assessment & Plan Note (Addendum)
-  Patient reports bright red blood per rectum prior to admission.  She states this has been a recurrent problem for approx 3 months (bleeding almost monthly).  Source appears to be underlying diverticulosis.  GI has recommended patient to consider surgical evaluation for possible elective subtotal colectomy. Have not been able to locate specific area of her bleeding as of yet - continue trending hemoglobin -Transfuse for hemoglobin less than 7 g/dL -had been relatively stable until evening of 1/5 when she had ~11 bloody bowel movements  - Hgb down to 6.8 g/dL on 1/7; 2 units PRBC ordered -Seen by general surgery; no indication for emergent surgery at this time and patient also still declining any elective surgery -Colonoscopy performed on 11/22/2020. No evidence or obvious bleeding seen. Etiology still considered to be bleeding diverticula -Outpatient follow-up with GI and possible surgical referral if she wishes for future elective surgery; currently she does not

## 2020-11-20 NOTE — Progress Notes (Addendum)
Pt has had two episodes of bloody stool and clots during day shift. The second episode occurred during shift change. Provider notified. HGB 11. Will continue to monitor.   Pt has had 4 more episodes of bloody BM. A total of 6 today. Bright red blood. No stool found in last 3 BMs. Vitals are stable. Anna Genre, NP on call notified. No new orders.

## 2020-11-20 NOTE — Progress Notes (Signed)
PROGRESS NOTE    Carol Mueller   VOZ:[id number]  DOB: Apr 30, 1961  DOA: 11/19/2020     0  PCP: [name], DO  CC: rectal bleeding  Hospital Course: Carol Mueller is a 60 yo female with PMH diverticulosis, Cushing syndrome (status post adrenalectomy), type 2 diabetes, hyperlipidemia, hypertension, PCOD who presented to the hospital with recurrent bright red blood per rectum.  This has been a similar problem in the past associated with diverticulosis.  She has been recommended to have outpatient evaluation general surgery to discuss elective subtotal colectomy, however she has been against due to difficulty locating source of her bleeding. She was admitted for further hemoglobin monitoring and possible need for inpatient surgical evaluation.   Interval History:  Patient seen this afternoon sitting up in chair bedside.  She denies any recurrent bleeding since yesterday evening.  Denies any nausea or vomiting.  Is asking for diet to be advanced.   Old records reviewed in assessment of this patient  ROS: Constitutional: negative for chills and fevers, Respiratory: negative for cough, Cardiovascular: negative for chest pain and Gastrointestinal: negative for abdominal pain  Assessment & Plan: * GI bleed -Patient reports bright red blood per rectum prior to admission.  She states this has been a recurrent problem for approx 3 months (bleeding almost monthly).  Source appears to be underlying diverticulosis.  GI has recommended patient to consider surgical evaluation for possible elective subtotal colectomy -For now, continue trending hemoglobin -If she does develop repeat acute bleed, will obtain tagged red blood cell scan -Transfuse for hemoglobin less than 7 g/dL -Given no further bleeding episodes since yesterday evening, will advance diet to low fiber at this time  Hyperlipidemia with target LDL less than 100 -Continue statin  Essential hypertension -Continue Coreg -Benicar on  hold  Diabetes mellitus (HCC) -Continue Lantus, SSI, and CBG monitoring    Antimicrobials: N/A  DVT prophylaxis: SCD Code Status: Full Family Communication: None present Disposition Plan: Status is: Observation  The patient remains OBS appropriate and will d/c before 2 midnights.  Dispo: The patient is from: Home              Anticipated d/c is to: Home              Anticipated d/c date is: 1 day              Patient currently is not medically stable to d/c.  Objective: Blood pressure 109/63, pulse 74, temperature 98.2 F (36.8 C), temperature source Oral, resp. rate 18, SpO2 99 %.  Examination: General appearance: alert, cooperative and no distress Head: Normocephalic, without obvious abnormality, atraumatic Eyes: EOMI Lungs: clear to auscultation bilaterally Heart: regular rate and rhythm and S1, S2 normal Abdomen: Obese, soft, nontender, nondistended, bowel sounds present Extremities: No lower extremity edema Skin: mobility and turgor normal Neurologic: Grossly normal  Consultants:   Gastroenterology  Procedures:     Data Reviewed: I have personally reviewed following labs and imaging studies Results for orders placed or performed during the hospital encounter of 11/19/20 (from the past 24 hour(s))  POC occult blood, ED     Status: Abnormal   Collection Time: 11/19/20  2:58 PM  Result Value Ref Range   Fecal Occult Bld POSITIVE (A) NEGATIVE  Hemoglobin and hematocrit, blood     Status: Abnormal   Collection Time: 11/19/20  3:59 PM  Result Value Ref Range   Hemoglobin 11.9 (L) 12.0 - 15.0 g/dL   HCT 42.5 95.6 -  46.0 %  CBG monitoring, ED     Status: Abnormal   Collection Time: 11/19/20  4:02 PM  Result Value Ref Range   Glucose-Capillary 116 (H) 70 - 99 mg/dL  Resp Panel by RT-PCR (Flu A&B, Covid) Nasopharyngeal Swab     Status: None   Collection Time: 11/19/20  4:58 PM   Specimen: Nasopharyngeal Swab; Nasopharyngeal(NP) swabs in vial transport medium   Result Value Ref Range   SARS Coronavirus 2 by RT PCR NEGATIVE NEGATIVE   Influenza A by PCR NEGATIVE NEGATIVE   Influenza B by PCR NEGATIVE NEGATIVE  Glucose, capillary     Status: Abnormal   Collection Time: 11/19/20  9:28 PM  Result Value Ref Range   Glucose-Capillary 130 (H) 70 - 99 mg/dL  Glucose, capillary     Status: Abnormal   Collection Time: 11/20/20  7:38 AM  Result Value Ref Range   Glucose-Capillary 143 (H) 70 - 99 mg/dL  Comprehensive metabolic panel     Status: Abnormal   Collection Time: 11/20/20  7:47 AM  Result Value Ref Range   Sodium 134 (L) 135 - 145 mmol/L   Potassium 4.1 3.5 - 5.1 mmol/L   Chloride 100 98 - 111 mmol/L   CO2 24 22 - 32 mmol/L   Glucose, Bld 152 (H) 70 - 99 mg/dL   BUN 23 (H) 6 - 20 mg/dL   Creatinine, Ser 0.91 0.44 - 1.00 mg/dL   Calcium 9.0 8.9 - 10.3 mg/dL   Total Protein 7.9 6.5 - 8.1 g/dL   Albumin 4.1 3.5 - 5.0 g/dL   AST 17 15 - 41 U/L   ALT 16 0 - 44 U/L   Alkaline Phosphatase 49 38 - 126 U/L   Total Bilirubin 0.6 0.3 - 1.2 mg/dL   GFR, Estimated >60 >60 mL/min   Anion gap 10 5 - 15  CBC     Status: Abnormal   Collection Time: 11/20/20  7:47 AM  Result Value Ref Range   WBC 9.2 4.0 - 10.5 K/uL   RBC 4.90 3.87 - 5.11 MIL/uL   Hemoglobin 11.0 (L) 12.0 - 15.0 g/dL   HCT 35.8 (L) 36.0 - 46.0 %   MCV 73.1 (L) 80.0 - 100.0 fL   MCH 22.4 (L) 26.0 - 34.0 pg   MCHC 30.7 30.0 - 36.0 g/dL   RDW 17.9 (H) 11.5 - 15.5 %   Platelets 253 150 - 400 K/uL   nRBC 0.0 0.0 - 0.2 %  Glucose, capillary     Status: Abnormal   Collection Time: 11/20/20 12:04 PM  Result Value Ref Range   Glucose-Capillary 148 (H) 70 - 99 mg/dL    Recent Results (from the past 240 hour(s))  Resp Panel by RT-PCR (Flu A&B, Covid) Nasopharyngeal Swab     Status: None   Collection Time: 11/19/20  4:58 PM   Specimen: Nasopharyngeal Swab; Nasopharyngeal(NP) swabs in vial transport medium  Result Value Ref Range Status   SARS Coronavirus 2 by RT PCR NEGATIVE NEGATIVE  Final    Comment: (NOTE) SARS-CoV-2 target nucleic acids are NOT DETECTED.  The SARS-CoV-2 RNA is generally detectable in upper respiratory specimens during the acute phase of infection. The lowest concentration of SARS-CoV-2 viral copies this assay can detect is 138 copies/mL. A negative result does not preclude SARS-Cov-2 infection and should not be used as the sole basis for treatment or other patient management decisions. A negative result may occur with  improper specimen collection/handling, submission of specimen  other than nasopharyngeal swab, presence of viral mutation(s) within the areas targeted by this assay, and inadequate number of viral copies(<138 copies/mL). A negative result must be combined with clinical observations, patient history, and epidemiological information. The expected result is Negative.  Fact Sheet for Patients:  EntrepreneurPulse.com.au  Fact Sheet for Healthcare Providers:  IncredibleEmployment.be  This test is no t yet approved or cleared by the [place] FDA and  has been authorized for detection and/or diagnosis of SARS-CoV-2 by FDA under an Emergency Use Authorization (EUA). This EUA will remain  in effect (meaning this test can be used) for the duration of the COVID-19 declaration under Section 564(b)(1) of the Act, 21 U.S.C.section 360bbb-3(b)(1), unless the authorization is terminated  or revoked sooner.       Influenza A by PCR NEGATIVE NEGATIVE Final   Influenza B by PCR NEGATIVE NEGATIVE Final    Comment: (NOTE) The Xpert Xpress SARS-CoV-2/FLU/RSV plus assay is intended as an aid in the diagnosis of influenza from Nasopharyngeal swab specimens and should not be used as a sole basis for treatment. Nasal washings and aspirates are unacceptable for Xpert Xpress SARS-CoV-2/FLU/RSV testing.  Fact Sheet for Patients: EntrepreneurPulse.com.au  Fact Sheet for Healthcare  Providers: IncredibleEmployment.be  This test is not yet approved or cleared by the [place] FDA and has been authorized for detection and/or diagnosis of SARS-CoV-2 by FDA under an Emergency Use Authorization (EUA). This EUA will remain in effect (meaning this test can be used) for the duration of the COVID-19 declaration under Section 564(b)(1) of the Act, 21 U.S.C. section 360bbb-3(b)(1), unless the authorization is terminated or revoked.  Performed at [facility], [place] [address], [place], [place] [address]      Radiology Studies: No results found. No orders to display    Scheduled Meds: . atorvastatin  10 mg Oral Daily  . dicyclomine  20 mg Oral BID  . insulin aspart  0-15 Units Subcutaneous TID WC  . insulin aspart  0-5 Units Subcutaneous QHS  . insulin glargine  20 Units Subcutaneous QHS   PRN Meds: acetaminophen **OR** acetaminophen, ondansetron **OR** ondansetron (ZOFRAN) IV Continuous Infusions: . lactated ringers 50 mL/hr at 11/20/20 0100     LOS: 0 days  Time spent: Greater than 50% of the 35 minute visit was spent in counseling/coordination of care for the patient as laid out in the A&P.   Dwyane Dee, MD [facility] 11/20/2020, 2:49 PM

## 2020-11-20 NOTE — Consult Note (Addendum)
Consultation  Referring Provider: Dr. Sabino Gasser     Primary Care Physician:  [name], DO Primary Gastroenterologist: Dr. [name]        Reason for Consultation: GI bleed             HPI:   Carol Mueller is a 60 y.o. female, known to Dr. [name] with a history of recurrent diverticular bleeding, pandiverticulosis and adenomatous colon polyps as well as Cushing's disease status post unilateral acrinol ectomy, polycystic disease, status post cholecystectomy and others listed below, who we are consulted on regards to a repeat GI bleed.    Last admission 10/23/2020-10/25/2020 for diverticular bleed.  Previous to this had a 3-day hospitalization in November with a diverticular bleed which is lower which did not require transfusions.  She has had several episodes of diverticular bleeding over the past 5 years.  During that admission patient had a hemoglobin 12.3 on presentation and did undergo need bleeding scan which was negative, did not manifest any further active bleeding, hemoglobin did drift to 9.7.    Today, the patient explains that yesterday she woke up at 7 AM and had a bowel movement with gross blood, she then continued to have 2 more bloody bowel movements while at home and was experiencing some dizziness and fatigue so she presented to the ER.  In the ER she had 3 more with her last small maroon-colored stool at about 8:30 PM yesterday.  None since then.  Tells me that today she just feels "wore out" and when she tries to stand up she feels dizzy.  Also just a generalized abdominal discomfort.    Denies fever, chills or weight loss.  ER course: Hemoccult positive, hemoglobin 12.4--> 11.9 while in ER  Recent GI history: 11/14/2020 patient seen in clinic by Nicoletta Ba, PA-C: At time of follow-up after recent hospitalization as above she had been doing well.  Apparently surgical consultation was discussed but she did not want to pursue this. 09/2019 colonoscopy: Pandiverticulosis,  internal hemorrhoids and 2 polyps removed which were 6-8 mm in size and found to be adenomatous and hyperplastic  Past Medical History:  Diagnosis Date  . Anemia   . Cushing's syndrome ([place])    due to adrenal tumor  . Diabetes mellitus without complication (HCC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  . Diverticulitis   . Diverticulosis   . GI bleed   . Hyperlipidemia   . Hypertension   . Polycystic ovary disease   . Sepsis ([place]) 05/2019    Past Surgical History:  Procedure Laterality Date  . ADRENALECTOMY  1996  . BREAST BIOPSY    . CHOLECYSTECTOMY    . COLONOSCOPY WITH PROPOFOL N/A 10/03/2019   Procedure: COLONOSCOPY WITH PROPOFOL;  Surgeon: Ladene Artist, MD;  Location: [facility];  Service: Endoscopy;  Laterality: N/A;  . ESOPHAGOGASTRODUODENOSCOPY (EGD) WITH PROPOFOL N/A 07/16/2015   Procedure: ESOPHAGOGASTRODUODENOSCOPY (EGD) WITH PROPOFOL;  Surgeon: [name], MD;  Location: [facility];  Service: Endoscopy;  Laterality: N/A;  . POLYPECTOMY  10/03/2019   Procedure: POLYPECTOMY;  Surgeon: Ladene Artist, MD;  Location: [facility];  Service: Endoscopy;;    Family History  Problem Relation Age of Onset  . Hypertension Mother   . Cancer Mother        ?uterine or ovarian cancer  . Hypertension Father   . Diabetes Father   . Hypertension Sister   . Diabetes Sister   . Hypertension Brother   . Diabetes Brother   .  Hypertension Maternal Grandfather   . Diabetes Paternal Grandmother   . Breast cancer Maternal Grandmother   . Colon cancer Neg Hx     Social History   Tobacco Use  . Smoking status: Never Smoker  . Smokeless tobacco: Never Used  Vaping Use  . Vaping Use: Never used  Substance Use Topics  . Alcohol use: No  . Drug use: No    Prior to Admission medications   Medication Sig Start Date End Date Taking? Authorizing Provider  acetaminophen (TYLENOL) 325 MG tablet Take 2 tablets (650 mg total) by mouth every 6 (six) hours as needed for mild pain (or Fever  >/= 101). Patient taking differently: Take 650 mg by mouth as needed for mild pain (or Fever >/= 101). 09/19/19  Yes Berton Mount I, MD  atorvastatin (LIPITOR) 10 MG tablet Take 10 mg by mouth daily. 03/06/19  Yes [provider]  calcium citrate-vitamin D (CITRACAL+D) 315-200 MG-UNIT tablet Take 1 tablet by mouth daily.   Yes [provider]  carvedilol (COREG) 25 MG tablet Take 25 mg by mouth 2 (two) times daily. 10/30/20  Yes [provider]  dicyclomine (BENTYL) 20 MG tablet TAKE 1 TABLET BY MOUTH TWICE A DAY Patient taking differently: Take 20 mg by mouth as needed for spasms. 06/02/17  Yes Rachael Fee, MD  empagliflozin (JARDIANCE) 10 MG TABS tablet Take 10 mg by mouth daily.   Yes [provider]  glimepiride (AMARYL) 2 MG tablet Take 2 mg by mouth daily. 02/23/20  Yes [provider]  Insulin Glargine (BASAGLAR KWIKPEN) 100 UNIT/ML Inject 45 Units into the skin at bedtime.  02/20/20  Yes [provider]  metFORMIN (GLUCOPHAGE) 1000 MG tablet Take 500 mg by mouth 2 (two) times daily with a meal.  01/11/20  Yes [provider]  Multiple Vitamins-Minerals (MULTIVITAMIN WITH MINERALS) tablet Take 1 tablet by mouth daily.   Yes [provider]  olmesartan (BENICAR) 40 MG tablet Take 40 mg by mouth daily. 10/11/19  Yes [provider]  Potassium Chloride ER 20 MEQ TBCR Take 20 mEq by mouth daily. 10/30/20  Yes [provider]  ferrous sulfate 325 (65 FE) MG EC tablet Take 1 tablet (325 mg total) by mouth every Monday, Wednesday, and Friday for 14 days. Patient not taking: Reported on 11/19/2020 10/28/20 11/11/20  Albertine Grates, MD    Current Facility-Administered Medications  Medication Dose Route Frequency Provider Last Rate Last Admin  . acetaminophen (TYLENOL) tablet 650 mg  650 mg Oral Q6H PRN Rolly Salter, MD       Or  . acetaminophen (TYLENOL) suppository 650 mg  650 mg Rectal Q6H PRN Rolly Salter, MD      . atorvastatin (LIPITOR) tablet 10 mg  10 mg Oral Daily Rolly Salter, MD   10 mg at 11/19/20 2127  . dicyclomine (BENTYL) tablet 20 mg  20 mg Oral BID Rolly Salter, MD   20 mg at 11/19/20 2127  . insulin aspart (novoLOG) injection 0-15 Units  0-15 Units Subcutaneous TID WC Rolly Salter, MD   2 Units at 11/20/20 [phone]  . insulin aspart (novoLOG) injection 0-5 Units  0-5 Units Subcutaneous QHS Rolly Salter, MD      . insulin glargine (LANTUS) injection 20 Units  20 Units Subcutaneous QHS Rolly Salter, MD   20 Units at 11/19/20 2147  . lactated ringers infusion   Intravenous Continuous Rolly Salter, MD 50 mL/hr  at 11/20/20 0100 New Bag at 11/20/20 0100  . ondansetron (ZOFRAN) tablet 4 mg  4 mg Oral Q6H PRN Lavina Hamman, MD       Or  . ondansetron [facility]) injection 4 mg  4 mg Intravenous Q6H PRN Lavina Hamman, MD        Allergies as of 11/19/2020 - Review Complete 11/19/2020  Allergen Reaction Noted  . Norvasc [amlodipine besylate] Swelling 02/22/2012  . Nsaids Other (See Comments) 08/07/2015  . Sulfa antibiotics Other (See Comments) 02/22/2012     Review of Systems:    Constitutional: No weight loss, fever or chills Skin: No ras Cardiovascular: No chest pain Respiratory: No SOB  Gastrointestinal: See HPI and otherwise negative Genitourinary: No dysuria Neurological: No headache Musculoskeletal: No new muscle or joint pain Hematologic: No bruising Psychiatric: No history of depression or anxiety    Physical Exam:  Vital signs in last 24 hours: Temp:  [98.3 F (36.8 C)-99.2 F (37.3 C)] 98.6 F (37 C) (01/05 0545) Pulse Rate:  [74-89] 77 (01/05 0545) Resp:  [15-18] 16 (01/05 0545) BP: (92-136)/(53-92) 100/68 (01/05 0545) SpO2:  [94 %-99 %] 97 % (01/05 0545) Last BM Date: 11/19/20 General:   Pleasant AA female appears to be in NAD, Well developed, Well nourished, alert and cooperative Head:  Normocephalic and atraumatic. Eyes:   PEERL, EOMI. No  icterus. Conjunctiva pink. Ears:  Normal auditory acuity. Neck:  Supple Throat: Oral cavity and pharynx without inflammation, swelling or lesion.  Lungs: Respirations even and unlabored. Lungs clear to auscultation bilaterally.   No wheezes, crackles, or rhonchi.  Heart: Normal S1, S2. No MRG. Regular rate and rhythm. No peripheral edema, cyanosis or pallor.  Abdomen:  Soft, nondistended, mild generalized ttp, No rebound or guarding. Normal bowel sounds. No appreciable masses or hepatomegaly. Rectal:  Not performed.  Msk:  Symmetrical without gross deformities. Peripheral pulses intact.  Extremities:  Without edema, no deformity or joint abnormality.  Neurologic:  Alert and  oriented x4;  grossly normal neurologically.   Skin:   Dry and intact without significant lesions or rashes. Psychiatric: Demonstrates good judgement and reason without abnormal affect or behaviors.   LAB RESULTS: Recent Labs    11/19/20 1314 11/19/20 1559  WBC 11.3*  --   HGB 12.6 11.9*  HCT 42.0 39.1  PLT 283  --    BMET Recent Labs    11/19/20 1314  NA 139  K 4.2  CL 103  CO2 27  GLUCOSE 156*  BUN 23*  CREATININE 1.00  CALCIUM 9.6   LFT Recent Labs    11/19/20 1314  PROT 8.6*  ALBUMIN 4.5  AST 15  ALT 16  ALKPHOS 53  BILITOT 0.7    Impression / Plan:   Impression: 1.  Recurrent diverticular bleed: At presentation hemoglobin 12.6--> 11.0 overnight, last maroon-colored bowel movement about 8:30 PM with a total of 6 yesterday, very similar to previous presentations 2.  Diabetes type 2 3.  Cushing's disease  Plan: 1.  Again discussed possible surgical consultation as an outpatient, patient is very reserved about doing this because "we do not know exactly where the bleeding is coming from" and apparently her sister had some of her colon removed and had some complications afterward including bowel obstructions etc. 2.  Continue to monitor hemoglobin every 8 hours with transfusion as needed  less than 7.  The patient has no further bleeding over the next 24 hours she can likely be discharged later this evening  or tomorrow. 3.  Clear liquid diet to continue for now, if no further bleeding into this afternoon this could be advanced for dinner to a low fiber/ low residue. 4.  No signs of quick bleeding in the near future would recommend a CTA or nuc med bleeding scan. 5.  Please await further recommendations from Dr. Fuller Plan later today.  Thank you for your kind consultation, we will continue to follow.  Lavone Nian [facility]  11/20/2020, 8:45 AM      Attending Physician Note   I have taken an interval history, reviewed the chart and examined the patient. I agree with the Advanced Practitioner's note, impression and recommendations.   Impression: Recurrent diverticular bleed ABL anemia.   Recommendation: * Trend CBC * CTA or nuclear tagged RBC scan for recurrent brisk bleeding and IR consultation if a bleeding site is located *  Encouraged her to consider elective colectomy given recurrent bleeding. Likely would need to be a subtotal colectomy to remove all diverticulosis  Lucio Edward, MD [facility]

## 2020-11-20 NOTE — Assessment & Plan Note (Signed)
-  Continue Lantus, SSI, and CBG monitoring

## 2020-11-20 NOTE — Assessment & Plan Note (Signed)
Continue statin. 

## 2020-11-20 NOTE — Assessment & Plan Note (Signed)
-  Coreg and Benicar resumed at discharge

## 2020-11-21 DIAGNOSIS — K219 Gastro-esophageal reflux disease without esophagitis: Secondary | ICD-10-CM

## 2020-11-21 DIAGNOSIS — I10 Essential (primary) hypertension: Secondary | ICD-10-CM | POA: Diagnosis present

## 2020-11-21 DIAGNOSIS — K317 Polyp of stomach and duodenum: Secondary | ICD-10-CM | POA: Diagnosis not present

## 2020-11-21 DIAGNOSIS — K648 Other hemorrhoids: Secondary | ICD-10-CM | POA: Diagnosis present

## 2020-11-21 DIAGNOSIS — Z886 Allergy status to analgesic agent status: Secondary | ICD-10-CM | POA: Diagnosis not present

## 2020-11-21 DIAGNOSIS — K5731 Diverticulosis of large intestine without perforation or abscess with bleeding: Secondary | ICD-10-CM | POA: Diagnosis present

## 2020-11-21 DIAGNOSIS — Z882 Allergy status to sulfonamides status: Secondary | ICD-10-CM | POA: Diagnosis not present

## 2020-11-21 DIAGNOSIS — K922 Gastrointestinal hemorrhage, unspecified: Secondary | ICD-10-CM | POA: Diagnosis not present

## 2020-11-21 DIAGNOSIS — E282 Polycystic ovarian syndrome: Secondary | ICD-10-CM | POA: Diagnosis present

## 2020-11-21 DIAGNOSIS — K625 Hemorrhage of anus and rectum: Secondary | ICD-10-CM | POA: Diagnosis not present

## 2020-11-21 DIAGNOSIS — K5791 Diverticulosis of intestine, part unspecified, without perforation or abscess with bleeding: Secondary | ICD-10-CM | POA: Diagnosis present

## 2020-11-21 DIAGNOSIS — E785 Hyperlipidemia, unspecified: Secondary | ICD-10-CM | POA: Diagnosis present

## 2020-11-21 DIAGNOSIS — D500 Iron deficiency anemia secondary to blood loss (chronic): Secondary | ICD-10-CM | POA: Diagnosis not present

## 2020-11-21 DIAGNOSIS — E249 Cushing's syndrome, unspecified: Secondary | ICD-10-CM | POA: Diagnosis present

## 2020-11-21 DIAGNOSIS — D121 Benign neoplasm of appendix: Secondary | ICD-10-CM | POA: Diagnosis not present

## 2020-11-21 DIAGNOSIS — K30 Functional dyspepsia: Secondary | ICD-10-CM | POA: Diagnosis present

## 2020-11-21 DIAGNOSIS — D123 Benign neoplasm of transverse colon: Secondary | ICD-10-CM | POA: Diagnosis not present

## 2020-11-21 DIAGNOSIS — K635 Polyp of colon: Secondary | ICD-10-CM | POA: Diagnosis present

## 2020-11-21 DIAGNOSIS — Z20822 Contact with and (suspected) exposure to covid-19: Secondary | ICD-10-CM | POA: Diagnosis present

## 2020-11-21 DIAGNOSIS — D62 Acute posthemorrhagic anemia: Secondary | ICD-10-CM | POA: Diagnosis present

## 2020-11-21 DIAGNOSIS — E119 Type 2 diabetes mellitus without complications: Secondary | ICD-10-CM | POA: Diagnosis not present

## 2020-11-21 DIAGNOSIS — Z794 Long term (current) use of insulin: Secondary | ICD-10-CM | POA: Diagnosis not present

## 2020-11-21 DIAGNOSIS — E876 Hypokalemia: Secondary | ICD-10-CM | POA: Diagnosis not present

## 2020-11-21 DIAGNOSIS — Z79899 Other long term (current) drug therapy: Secondary | ICD-10-CM | POA: Diagnosis not present

## 2020-11-21 DIAGNOSIS — K921 Melena: Secondary | ICD-10-CM | POA: Diagnosis not present

## 2020-11-21 DIAGNOSIS — E1169 Type 2 diabetes mellitus with other specified complication: Secondary | ICD-10-CM | POA: Diagnosis not present

## 2020-11-21 DIAGNOSIS — E1165 Type 2 diabetes mellitus with hyperglycemia: Secondary | ICD-10-CM | POA: Diagnosis present

## 2020-11-21 DIAGNOSIS — Z6841 Body Mass Index (BMI) 40.0 and over, adult: Secondary | ICD-10-CM | POA: Diagnosis not present

## 2020-11-21 DIAGNOSIS — Z7984 Long term (current) use of oral hypoglycemic drugs: Secondary | ICD-10-CM | POA: Diagnosis not present

## 2020-11-21 DIAGNOSIS — Q398 Other congenital malformations of esophagus: Secondary | ICD-10-CM | POA: Diagnosis not present

## 2020-11-21 DIAGNOSIS — Z888 Allergy status to other drugs, medicaments and biological substances status: Secondary | ICD-10-CM | POA: Diagnosis not present

## 2020-11-21 LAB — BASIC METABOLIC PANEL
Anion gap: 8 (ref 5–15)
BUN: 18 mg/dL (ref 6–20)
CO2: 23 mmol/L (ref 22–32)
Calcium: 8.5 mg/dL — ABNORMAL LOW (ref 8.9–10.3)
Chloride: 104 mmol/L (ref 98–111)
Creatinine, Ser: 0.82 mg/dL (ref 0.44–1.00)
GFR, Estimated: >60 mL/min (ref >60)
Glucose, Bld: 200 mg/dL — ABNORMAL HIGH (ref 70–99)
Potassium: 4.3 mmol/L (ref 3.5–5.1)
Sodium: 135 mmol/L (ref 135–145)

## 2020-11-21 LAB — CBC
HCT: 31.8 % — ABNORMAL LOW (ref 36.0–46.0)
Hemoglobin: 9.5 g/dL — ABNORMAL LOW (ref 12.0–15.0)
MCH: 22.5 pg — ABNORMAL LOW (ref 26.0–34.0)
MCHC: 29.9 g/dL — ABNORMAL LOW (ref 30.0–36.0)
MCV: 75.4 fL — ABNORMAL LOW (ref 80.0–100.0)
Platelets: 271 10*3/uL (ref 150–400)
RBC: 4.22 MIL/uL (ref 3.87–5.11)
RDW: 18.1 % — ABNORMAL HIGH (ref 11.5–15.5)
WBC: 7.8 10*3/uL (ref 4.0–10.5)
nRBC: 0 % (ref 0.0–0.2)

## 2020-11-21 LAB — HEMOGLOBIN AND HEMATOCRIT, BLOOD
HCT: 24.5 % — ABNORMAL LOW (ref 36.0–46.0)
Hemoglobin: 7.5 g/dL — ABNORMAL LOW (ref 12.0–15.0)

## 2020-11-21 LAB — GLUCOSE, CAPILLARY
Glucose-Capillary: 228 mg/dL — ABNORMAL HIGH (ref 70–99)
Glucose-Capillary: 155 mg/dL — ABNORMAL HIGH (ref 70–99)
Glucose-Capillary: 162 mg/dL — ABNORMAL HIGH (ref 70–99)
Glucose-Capillary: 156 mg/dL — ABNORMAL HIGH (ref 70–99)

## 2020-11-21 MED ORDER — PEG-KCL-NACL-NASULF-NA ASC-C 100 G PO SOLR
0.5000 | Freq: Once | ORAL | Status: AC
  Administered 2020-11-21: 100 g via ORAL

## 2020-11-21 MED ORDER — PANTOPRAZOLE SODIUM 40 MG IV SOLR
40.0000 mg | Freq: Two times a day (BID) | INTRAVENOUS | Status: DC
  Administered 2020-11-21 – 2020-11-23 (×4): 40 mg via INTRAVENOUS
  Filled 2020-11-21 (×4): qty 40

## 2020-11-21 MED ORDER — FAMOTIDINE 20 MG PO TABS
20.0000 mg | ORAL_TABLET | Freq: Two times a day (BID) | ORAL | Status: DC | PRN
  Administered 2020-11-21 – 2020-11-22 (×2): 20 mg via ORAL
  Filled 2020-11-21 (×2): qty 1

## 2020-11-21 MED ORDER — PEG-KCL-NACL-NASULF-NA ASC-C 100 G PO SOLR
0.5000 | Freq: Once | ORAL | Status: AC
  Administered 2020-11-21: 100 g via ORAL
  Filled 2020-11-21 (×2): qty 1

## 2020-11-21 MED ORDER — PEG-KCL-NACL-NASULF-NA ASC-C 100 G PO SOLR: 1.0000 | Freq: Once | ORAL | Status: DC

## 2020-11-21 NOTE — Consult Note (Signed)
Altamease Oiler 1961-05-30  [id number].    Requesting MD: Dr. Fuller Plan Chief Complaint/Reason for Consult: GI bleed, hx of diverticulosis  HPI: Carol Mueller is a 60 y.o. female with a hx Cushing syndrome s/p open adrenalectomy, HTN, HLD, DM2 and recurrent GI bleed who presented to [facility] on 1/4 for GI bleed.  Patient reports on 1/4 she began having painless BRB per rectum.  She denies clots or melena.  She has had continued BRB per rectum with > 10 in the last 24 hours. Her last bloody bm was at 1030am this morning. She reports that over the last 24 hours she has been developing some generalized abdominal discomfort, but this is not localized in any area.  She is now feeling dizzy/lightheaded when she stands.  She admitted multiple times in the past for GI bleed, with the most recent prior to this being in December 2021.  She had a colonoscopy by Dr. Fuller Plan on 09/1719 that showed diverticulosis and internal hemorrhoids without evidence of bleeding.  GI is planning for colonoscopy tomorrow.  Hgb 12.6>11.9>11>9.5 since admission. We were asked to see. She is not on any blood thinners. She has a hx of prior open adrenalectomy and laparoscopic cholecystectomy.   ROS: Review of Systems  Constitutional: Negative for chills and fever.  Respiratory: Negative for shortness of breath.   Cardiovascular: Negative for chest pain and leg swelling.  Gastrointestinal: Positive for abdominal pain and blood in stool. Negative for melena, nausea and vomiting.  Genitourinary: Negative for dysuria.  All other systems reviewed and are negative.   Family History  Problem Relation Age of Onset  . Hypertension Mother   . Cancer Mother        ?uterine or ovarian cancer  . Hypertension Father   . Diabetes Father   . Hypertension Sister   . Diabetes Sister   . Hypertension Brother   . Diabetes Brother   . Hypertension Maternal Grandfather   . Diabetes Paternal Grandmother   . Breast cancer Maternal  Grandmother   . Colon cancer Neg Hx     Past Medical History:  Diagnosis Date  . Anemia   . Cushing's syndrome ([place])    due to adrenal tumor  . Diabetes mellitus without complication (HCC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  . Diverticulitis   . Diverticulosis   . GI bleed   . Hyperlipidemia   . Hypertension   . Polycystic ovary disease   . Sepsis ([place]) 05/2019    Past Surgical History:  Procedure Laterality Date  . ADRENALECTOMY  1996  . BREAST BIOPSY    . CHOLECYSTECTOMY    . COLONOSCOPY WITH PROPOFOL N/A 10/03/2019   Procedure: COLONOSCOPY WITH PROPOFOL;  Surgeon: Ladene Artist, MD;  Location: [facility];  Service: Endoscopy;  Laterality: N/A;  . ESOPHAGOGASTRODUODENOSCOPY (EGD) WITH PROPOFOL N/A 07/16/2015   Procedure: ESOPHAGOGASTRODUODENOSCOPY (EGD) WITH PROPOFOL;  Surgeon: [name], MD;  Location: [facility];  Service: Endoscopy;  Laterality: N/A;  . POLYPECTOMY  10/03/2019   Procedure: POLYPECTOMY;  Surgeon: Ladene Artist, MD;  Location: [facility];  Service: Endoscopy;;    Social History:  reports that she has never smoked. She has never used smokeless tobacco. She reports that she does not drink alcohol and does not use drugs.  Allergies:  Allergies  Allergen Reactions  . Norvasc [Amlodipine Besylate] Swelling    ANGIOEDEMA  . Nsaids Other (See Comments)    GI upset/ GI bleed  . Sulfa Antibiotics  Other (See Comments)    childhood    Medications Prior to Admission  Medication Sig Dispense Refill  . acetaminophen (TYLENOL) 325 MG tablet Take 2 tablets (650 mg total) by mouth every 6 (six) hours as needed for mild pain (or Fever >/= 101). (Patient taking differently: Take 650 mg by mouth as needed for mild pain (or Fever >/= 101).) 40 tablet 0  . atorvastatin (LIPITOR) 10 MG tablet Take 10 mg by mouth daily.    . calcium citrate-vitamin D (CITRACAL+D) 315-200 MG-UNIT tablet Take 1 tablet by mouth daily.    . carvedilol (COREG) 25 MG tablet Take 25 mg by  mouth 2 (two) times daily.    Marland Kitchen dicyclomine (BENTYL) 20 MG tablet TAKE 1 TABLET BY MOUTH TWICE A DAY (Patient taking differently: Take 20 mg by mouth as needed for spasms.) 60 tablet 0  . empagliflozin (JARDIANCE) 10 MG TABS tablet Take 10 mg by mouth daily.    Marland Kitchen glimepiride (AMARYL) 2 MG tablet Take 2 mg by mouth daily.    . Insulin Glargine (BASAGLAR KWIKPEN) 100 UNIT/ML Inject 45 Units into the skin at bedtime.     . metFORMIN (GLUCOPHAGE) 1000 MG tablet Take 500 mg by mouth 2 (two) times daily with a meal.     . Multiple Vitamins-Minerals (MULTIVITAMIN WITH MINERALS) tablet Take 1 tablet by mouth daily.    Marland Kitchen olmesartan (BENICAR) 40 MG tablet Take 40 mg by mouth daily.    . Potassium Chloride ER 20 MEQ TBCR Take 20 mEq by mouth daily.    . ferrous sulfate 325 (65 FE) MG EC tablet Take 1 tablet (325 mg total) by mouth every Monday, Wednesday, and Friday for 14 days. (Patient not taking: Reported on 11/19/2020) 10 tablet 1     Physical Exam: Blood pressure 114/84, pulse (!) 105, temperature 99.3 F (37.4 C), temperature source Oral, resp. rate 18, SpO2 98 %. General: pleasant, WD/WN female who is laying in bed in NAD HEENT: head is normocephalic, atraumatic.  Sclera are noninjected.  PERRL.  Ears and nose without any masses or lesions.  Mouth is pink and moist. Dentition fair Heart: regular, rate, and rhythm.  Normal s1,s2. No obvious murmurs, gallops, or rubs noted.  Palpable pedal pulses bilaterally  Lungs: CTAB, no wheezes, rhonchi, or rales noted.  Respiratory effort nonlabored Abd: Soft, ND, mild generalized tenderness without peritonitis, +BS, no masses, hernias, or organomegaly MS: no BUE/BLE edema, calves soft and nontender Skin: warm and dry with no masses, lesions, or rashes Psych: A&Ox4 with an appropriate affect Neuro: cranial nerves grossly intact, equal strength in BUE/BLE bilaterally, normal speech, thought process intact   Results for orders placed or performed during the  hospital encounter of 11/19/20 (from the past 48 hour(s))  Comprehensive metabolic panel     Status: Abnormal   Collection Time: 11/19/20  1:14 PM  Result Value Ref Range   Sodium 139 135 - 145 mmol/L   Potassium 4.2 3.5 - 5.1 mmol/L   Chloride 103 98 - 111 mmol/L   CO2 27 22 - 32 mmol/L   Glucose, Bld 156 (H) 70 - 99 mg/dL    Comment: Glucose reference range applies only to samples taken after fasting for at least 8 hours.   BUN 23 (H) 6 - 20 mg/dL   Creatinine, Ser 5.05 0.44 - 1.00 mg/dL   Calcium 9.6 8.9 - 39.7 mg/dL   Total Protein 8.6 (H) 6.5 - 8.1 g/dL   Albumin 4.5 3.5 - 5.0  g/dL   AST 15 15 - 41 U/L   ALT 16 0 - 44 U/L   Alkaline Phosphatase 53 38 - 126 U/L   Total Bilirubin 0.7 0.3 - 1.2 mg/dL   GFR, Estimated >60 >60 mL/min    Comment: (NOTE) Calculated using the CKD-EPI Creatinine Equation (2021)    Anion gap 9 5 - 15    Comment: Performed at [facility], [place] [address] [place], [place] [address]  CBC     Status: Abnormal   Collection Time: 11/19/20  1:14 PM  Result Value Ref Range   WBC 11.3 (H) 4.0 - 10.5 K/uL   RBC 5.58 (H) 3.87 - 5.11 MIL/uL   Hemoglobin 12.6 12.0 - 15.0 g/dL   HCT 42.0 36.0 - 46.0 %   MCV 75.3 (L) 80.0 - 100.0 fL   MCH 22.6 (L) 26.0 - 34.0 pg   MCHC 30.0 30.0 - 36.0 g/dL   RDW 19.2 (H) 11.5 - 15.5 %   Platelets 283 150 - 400 K/uL   nRBC 0.0 0.0 - 0.2 %    Comment: Performed at [facility], [place] [address] [place], [place] [address]  Type and screen [place]     Status: None   Collection Time: 11/19/20  1:14 PM  Result Value Ref Range   ABO/RH(D) O POS    Antibody Screen NEG    Sample Expiration      11/22/2020,2359 Performed at [facility], [place] [address] [place], [place] [address]   I-Stat beta hCG blood, ED     Status: Abnormal   Collection Time: 11/19/20  1:28 PM  Result Value Ref Range   I-stat hCG, quantitative 9.6 (H) <5 mIU/mL   Comment 3             Comment:   GEST. AGE      CONC.  (mIU/mL)   <=1 WEEK        5 - 50     2 WEEKS       50 - 500     3 WEEKS       100 - 10,000     4 WEEKS     1,000 - 30,000        FEMALE AND NON-PREGNANT FEMALE:     LESS THAN 5 mIU/mL   POC occult blood, ED     Status: Abnormal   Collection Time: 11/19/20  2:58 PM  Result Value Ref Range   Fecal Occult Bld POSITIVE (A) NEGATIVE  Hemoglobin and hematocrit, blood     Status: Abnormal   Collection Time: 11/19/20  3:59 PM  Result Value Ref Range   Hemoglobin 11.9 (L) 12.0 - 15.0 g/dL   HCT 39.1 36.0 - 46.0 %    Comment: Performed at [facility], [place] [address], [place], [place] [address]  CBG monitoring, ED     Status: Abnormal   Collection Time: 11/19/20  4:02 PM  Result Value Ref Range   Glucose-Capillary 116 (H) 70 - 99 mg/dL    Comment: Glucose reference range applies only to samples taken after fasting for at least 8 hours.  Resp Panel by RT-PCR (Flu A&B, Covid) Nasopharyngeal Swab     Status: None   Collection Time: 11/19/20  4:58 PM   Specimen: Nasopharyngeal Swab; Nasopharyngeal(NP) swabs in vial transport medium  Result Value Ref Range   SARS Coronavirus 2 by RT PCR NEGATIVE NEGATIVE  Comment: (NOTE) SARS-CoV-2 target nucleic acids are NOT DETECTED.  The SARS-CoV-2 RNA is generally detectable in upper respiratory specimens during the acute phase of infection. The lowest concentration of SARS-CoV-2 viral copies this assay can detect is 138 copies/mL. A negative result does not preclude SARS-Cov-2 infection and should not be used as the sole basis for treatment or other patient management decisions. A negative result may occur with  improper specimen collection/handling, submission of specimen other than nasopharyngeal swab, presence of viral mutation(s) within the areas targeted by this assay, and inadequate number of viral copies(<138 copies/mL). A negative result must be combined with clinical observations,  patient history, and epidemiological information. The expected result is Negative.  Fact Sheet for Patients:  BloggerCourse.com  Fact Sheet for Healthcare Providers:  SeriousBroker.it  This test is no t yet approved or cleared by the [place] FDA and  has been authorized for detection and/or diagnosis of SARS-CoV-2 by FDA under an Emergency Use Authorization (EUA). This EUA will remain  in effect (meaning this test can be used) for the duration of the COVID-19 declaration under Section 564(b)(1) of the Act, 21 U.S.C.section 360bbb-3(b)(1), unless the authorization is terminated  or revoked sooner.       Influenza A by PCR NEGATIVE NEGATIVE   Influenza B by PCR NEGATIVE NEGATIVE    Comment: (NOTE) The Xpert Xpress SARS-CoV-2/FLU/RSV plus assay is intended as an aid in the diagnosis of influenza from Nasopharyngeal swab specimens and should not be used as a sole basis for treatment. Nasal washings and aspirates are unacceptable for Xpert Xpress SARS-CoV-2/FLU/RSV testing.  Fact Sheet for Patients: BloggerCourse.com  Fact Sheet for Healthcare Providers: SeriousBroker.it  This test is not yet approved or cleared by the [place] FDA and has been authorized for detection and/or diagnosis of SARS-CoV-2 by FDA under an Emergency Use Authorization (EUA). This EUA will remain in effect (meaning this test can be used) for the duration of the COVID-19 declaration under Section 564(b)(1) of the Act, 21 U.S.C. section 360bbb-3(b)(1), unless the authorization is terminated or revoked.  Performed at [facility], [address] [place], [place] [address]   Glucose, capillary     Status: Abnormal   Collection Time: 11/19/20  9:28 PM  Result Value Ref Range   Glucose-Capillary 130 (H) 70 - 99 mg/dL    Comment: Glucose reference range applies only to samples  taken after fasting for at least 8 hours.  Glucose, capillary     Status: Abnormal   Collection Time: 11/20/20  7:38 AM  Result Value Ref Range   Glucose-Capillary 143 (H) 70 - 99 mg/dL    Comment: Glucose reference range applies only to samples taken after fasting for at least 8 hours.  Comprehensive metabolic panel     Status: Abnormal   Collection Time: 11/20/20  7:47 AM  Result Value Ref Range   Sodium 134 (L) 135 - 145 mmol/L   Potassium 4.1 3.5 - 5.1 mmol/L   Chloride 100 98 - 111 mmol/L   CO2 24 22 - 32 mmol/L   Glucose, Bld 152 (H) 70 - 99 mg/dL    Comment: Glucose reference range applies only to samples taken after fasting for at least 8 hours.   BUN 23 (H) 6 - 20 mg/dL   Creatinine, Ser 2.84 0.44 - 1.00 mg/dL   Calcium 9.0 8.9 - 13.2 mg/dL   Total Protein 7.9 6.5 - 8.1 g/dL   Albumin 4.1 3.5 - 5.0 g/dL  AST 17 15 - 41 U/L   ALT 16 0 - 44 U/L   Alkaline Phosphatase 49 38 - 126 U/L   Total Bilirubin 0.6 0.3 - 1.2 mg/dL   GFR, Estimated >60 >60 mL/min    Comment: (NOTE) Calculated using the CKD-EPI Creatinine Equation (2021)    Anion gap 10 5 - 15    Comment: Performed at [facility], [place] [address] [place], [place] [address]  CBC     Status: Abnormal   Collection Time: 11/20/20  7:47 AM  Result Value Ref Range   WBC 9.2 4.0 - 10.5 K/uL   RBC 4.90 3.87 - 5.11 MIL/uL   Hemoglobin 11.0 (L) 12.0 - 15.0 g/dL   HCT 35.8 (L) 36.0 - 46.0 %   MCV 73.1 (L) 80.0 - 100.0 fL   MCH 22.4 (L) 26.0 - 34.0 pg   MCHC 30.7 30.0 - 36.0 g/dL   RDW 17.9 (H) 11.5 - 15.5 %   Platelets 253 150 - 400 K/uL   nRBC 0.0 0.0 - 0.2 %    Comment: Performed at [facility], [place] [address], [place], [place] [address]  Glucose, capillary     Status: Abnormal   Collection Time: 11/20/20 12:04 PM  Result Value Ref Range   Glucose-Capillary 148 (H) 70 - 99 mg/dL    Comment: Glucose reference range applies only to samples taken after fasting for at least 8  hours.  Glucose, capillary     Status: Abnormal   Collection Time: 11/20/20  5:01 PM  Result Value Ref Range   Glucose-Capillary 135 (H) 70 - 99 mg/dL    Comment: Glucose reference range applies only to samples taken after fasting for at least 8 hours.  Glucose, capillary     Status: Abnormal   Collection Time: 11/20/20  9:56 PM  Result Value Ref Range   Glucose-Capillary 199 (H) 70 - 99 mg/dL    Comment: Glucose reference range applies only to samples taken after fasting for at least 8 hours.  CBC     Status: Abnormal   Collection Time: 11/21/20  6:12 AM  Result Value Ref Range   WBC 7.8 4.0 - 10.5 K/uL   RBC 4.22 3.87 - 5.11 MIL/uL   Hemoglobin 9.5 (L) 12.0 - 15.0 g/dL   HCT 31.8 (L) 36.0 - 46.0 %   MCV 75.4 (L) 80.0 - 100.0 fL   MCH 22.5 (L) 26.0 - 34.0 pg   MCHC 29.9 (L) 30.0 - 36.0 g/dL   RDW 18.1 (H) 11.5 - 15.5 %   Platelets 271 150 - 400 K/uL   nRBC 0.0 0.0 - 0.2 %    Comment: Performed at [facility], [place] [address] [place], [place] [id number]  Basic metabolic panel     Status: Abnormal   Collection Time: 11/21/20  6:12 AM  Result Value Ref Range   Sodium 135 135 - 145 mmol/L   Potassium 4.3 3.5 - 5.1 mmol/L   Chloride 104 98 - 111 mmol/L   CO2 23 22 - 32 mmol/L   Glucose, Bld 200 (H) 70 - 99 mg/dL    Comment: Glucose reference range applies only to samples taken after fasting for at least 8 hours.   BUN 18 6 - 20 mg/dL   Creatinine, Ser 0.82 0.44 - 1.00 mg/dL   Calcium 8.5 (L) 8.9 - 10.3 mg/dL   GFR, Estimated >60 >60 mL/min    Comment: (NOTE) Calculated using the CKD-EPI  Creatinine Equation (2021)    Anion gap 8 5 - 15    Comment: Performed at [facility], [place] [address] [place], [place] [address]  Glucose, capillary     Status: Abnormal   Collection Time: 11/21/20  7:50 AM  Result Value Ref Range   Glucose-Capillary 228 (H) 70 - 99 mg/dL    Comment: Glucose reference range applies only to samples taken after fasting  for at least 8 hours.  Glucose, capillary     Status: Abnormal   Collection Time: 11/21/20 12:10 PM  Result Value Ref Range   Glucose-Capillary 155 (H) 70 - 99 mg/dL    Comment: Glucose reference range applies only to samples taken after fasting for at least 8 hours.   No results found.  Anti-infectives (From admission, onward)   None       Assessment/Plan Hx Cushing syndrome s/p open adrenalectomy HTN HLD DM2   Recurrent GI bleed Diverticulosis  - Patient with a history of multiple admissions for bright red blood per rectum.  - Hgb 12.6>11.9>11>9.5 since admission.   - She had a colonoscopy by Dr. Fuller Plan on 09/1719 that showed diverticulosis and internal hemorrhoids without evidence of bleeding.  GI is planning for colonoscopy tomorrow.   - No indication for emergency surgery at this time. Will follow up on colonoscopy results.  - Ideally if an area of the colon is identified as the cause of patient's bleeding, and she is able to avoid surgery during admission will have her follow-up with one of our colorectal surgeons to discuss elective resection - We did discuss the possibility of patient was not to improve during admission, and an area of colon was identified during colonoscopy as the cause of her bleeding, then she may need surgery during admission that could result in an ostomy.  - We will follow with you  FEN - NPO at midnight for colonoscopy in AM VTE - SCDs, no chemical prophylaxis given GI bleed ID - None  Jillyn Ledger, [facility] 11/21/2020, 12:22 PM Please see Amion for pager number during day hours 7:00am-4:30pm

## 2020-11-21 NOTE — Progress Notes (Addendum)
Progress Note   Subjective  Chief Complaint: GI bleed, presumed diverticular  This morning, patient tells me that she had 11 bloody bowel movements yesterday with the last around 8:00 after drinking a sip of water.  Apparently she tries to put anything in her mouth and she just has a bloody bowel movement.  Tells me she continues to feel dizzy with generalized abdominal discomfort.  Also complains of some heartburn/reflux symptoms when her stomach is empty.  Apparently was on pantoprazole a couple years ago but has not been on anything lately.   Objective   Vital signs in last 24 hours: Temp:  [98.2 F (36.8 C)-99.3 F (37.4 C)] 99.3 F (37.4 C) (01/06 0655) Pulse Rate:  [74-105] 105 (01/06 0655) Resp:  [16-18] 18 (01/06 0655) BP: (108-140)/(63-89) 114/84 (01/06 0655) SpO2:  [97 %-99 %] 98 % (01/06 0655) Last BM Date: 11/21/20 General:    AA female in NAD Heart:  Regular rate and rhythm; no murmurs Lungs: Respirations even and unlabored, lungs CTA bilaterally Abdomen:  Soft, mild generalized TTP and nondistended. Normal bowel sounds. Extremities:  Without edema. Neurologic:  Alert and oriented,  grossly normal neurologically. Psych:  Cooperative. Normal mood and affect.  Intake/Output from previous day: 01/05 0701 - 01/06 0700 In: 2855.6 [P.O.:1680; I.V.:1175.6] Out: -   Lab Results: Recent Labs    11/19/20 1314 11/19/20 1559 11/20/20 0747 11/21/20 0612  WBC 11.3*  --  9.2 7.8  HGB 12.6 11.9* 11.0* 9.5*  HCT 42.0 39.1 35.8* 31.8*  PLT 283  --  253 271   BMET Recent Labs    11/19/20 1314 11/20/20 0747 11/21/20 0612  NA 139 134* 135  K 4.2 4.1 4.3  CL 103 100 104  CO2 27 24 23   GLUCOSE 156* 152* 200*  BUN 23* 23* 18  CREATININE 1.00 0.91 0.82  CALCIUM 9.6 9.0 8.5*   LFT Recent Labs    11/20/20 0747  PROT 7.9  ALBUMIN 4.1  AST 17  ALT 16  ALKPHOS 49  BILITOT 0.6    Assessment / Plan:   Assessment: 1.  Recurrent diverticular bleed: At  presentation hemoglobin 12.6--> 11.0 --> 9.5 with 11 bloody bowel movements yesterday (making a total of 17), the last around 8 PM 11/20/2020 2.  Diabetes type 2 3.  Cushing's disease  Plan: 1.  Hospitalist informed us that he talked with the surgical team who are requesting a colonoscopy prior to discussing any further intervention such as subtotal colectomy.  Patient did have a colonoscopy at the end of 2020 with pandiverticular disease. Will discuss further with Dr. Fuller Plan. 2.  For now patient can be on a clear liquid diet 3.  Please continue to monitor hemoglobin and transfusion as needed less than 7 4.  Again would recommend a CTA stat or nuclear tagged red blood cell scan for recurrent brisk bleeding and IR consultation if the bleeding site is located. 5.  Added Pepcid 20 mg twice daily as needed for heartburn symptoms. 6.  Please await further recommendations from Dr. Fuller Plan later today  Thank you for kind consultation, we will continue to follow.    LOS: 0 days   Levin Erp  11/21/2020, 10:00 AM      Attending Physician Note   I have taken an interval history, reviewed the chart and examined the patient. I agree with the Advanced Practitioner's note, impression and recommendations.   * Persistent, active LGI bleeding suspected diverticular bleeding. If bleeding is more  brisk proceed with CTA. Schedule colonoscopy for tomorrow to further evaluate.  * ABL anemia. Trend CBC. Transfuse as indicated. * GERD on Pepcid 20 mg bid.  Claudette Head, MD [facility]astroenter[facility]

## 2020-11-21 NOTE — Progress Notes (Signed)
PROGRESS NOTE    Carol Mueller   WUX:[id number]  DOB: August 19, 1961  DOA: 11/19/2020     0  PCP: Carol Morning, DO  CC: rectal bleeding  Hospital Course: [name] is a 60 yo female with PMH diverticulosis, Cushing syndrome (s/p open adrenalectomy), type 2 diabetes, hyperlipidemia, hypertension, PCOD who presented to the hospital with recurrent bright red blood per rectum.  This has been a similar problem in the past associated with diverticulosis.  She has been recommended to have outpatient evaluation with general surgery to discuss elective subtotal colectomy, however she has been against this due to difficulty locating source of her bleeding. She was admitted for further hemoglobin monitoring and possible need for inpatient surgical evaluation vs bleeding scan if able to locate source.   Hgb had been stable until 11/21/2020 when she started having recurrent multiple bloody bowel movements. General surgery was consulted in case of need for emergent intervention and she was scheduled for undergoing repeat colonoscopy with GI on 11/22/2020.   Interval History:  Multiple bloody BMs reported since yesterday late afternoon (states about 11 movements). Also complaining of indigestion this am as well.  She was lethargic and rundown this Mueller.  Old records reviewed in assessment of this patient  ROS: Constitutional: negative for chills and fevers, Respiratory: negative for cough, Cardiovascular: negative for chest pain and Gastrointestinal: negative for abdominal pain  Assessment & Plan: * GI bleed -Patient reports bright red blood per rectum prior to admission.  She states this has been a recurrent problem for approx 3 months (bleeding almost monthly).  Source appears to be underlying diverticulosis.  GI has recommended patient to consider surgical evaluation for possible elective subtotal colectomy. Have not been able to locate specific area of her bleeding as of yet - continue trending  hemoglobin -Transfuse for hemoglobin less than 7 g/dL -had been relatively stable until evening of 1/5 when she had ~11 bloody bowel movements (hgb also dropped from 11>>9.5 since yesterday) - will consult general surgery at this time in case of need for intervention - GI planning for repeat CLN on 1/7 per surgery rec's as well  - if recurrent bleeding of significance will try to obtain CTA in order to locate source; renal function stable  GERD (gastroesophageal reflux disease) -Patient endorsing worsening indigestion and reflux -Start on IV Protonix twice daily -PRN pepcid also added per GI  Hyperlipidemia with target LDL less than 100 -Continue statin  Essential hypertension -Continue Coreg -Benicar on hold  Diabetes mellitus ([place]) -Continue Lantus, SSI, and CBG monitoring   Antimicrobials: N/A  DVT prophylaxis: SCD Code Status: Full Family Communication: None present Disposition Plan: Status is: Inpatient  Remains inpatient appropriate because:Ongoing diagnostic testing needed not appropriate for outpatient work up, IV treatments appropriate due to intensity of illness or inability to take PO and Inpatient level of care appropriate due to severity of illness   Dispo: The patient is from: Home              Anticipated d/c is to: Home              Anticipated d/c date is: 2 days              Patient currently is not medically stable to d/c.  Objective: Blood pressure 114/84, pulse (!) 105, temperature 99.3 F (37.4 C), temperature source Oral, resp. rate 18, SpO2 98 %.  Examination: General appearance: alert, cooperative and no distress Head: Normocephalic, without obvious abnormality, atraumatic Eyes:  EOMI Lungs: clear to auscultation bilaterally Heart: regular rate and rhythm and S1, S2 normal Abdomen: obese, soft, TTP nonspecifically throughout; BS present Extremities: No lower extremity edema Skin: mobility and turgor normal Neurologic: Grossly  normal  Consultants:   Gastroenterology  General surgery   Procedures:   CLN: 11/22/20 - tentative   Data Reviewed: I have personally reviewed following labs and imaging studies Results for orders placed or performed during the hospital encounter of 11/19/20 (from the past 24 hour(s))  Glucose, capillary     Status: Abnormal   Collection Time: 11/20/20  5:01 PM  Result Value Ref Range   Glucose-Capillary 135 (H) 70 - 99 mg/dL  Glucose, capillary     Status: Abnormal   Collection Time: 11/20/20  9:56 PM  Result Value Ref Range   Glucose-Capillary 199 (H) 70 - 99 mg/dL  CBC     Status: Abnormal   Collection Time: 11/21/20  6:12 AM  Result Value Ref Range   WBC 7.8 4.0 - 10.5 K/uL   RBC 4.22 3.87 - 5.11 MIL/uL   Hemoglobin 9.5 (L) 12.0 - 15.0 g/dL   HCT 31.8 (L) 36.0 - 46.0 %   MCV 75.4 (L) 80.0 - 100.0 fL   MCH 22.5 (L) 26.0 - 34.0 pg   MCHC 29.9 (L) 30.0 - 36.0 g/dL   RDW 18.1 (H) 11.5 - 15.5 %   Platelets 271 150 - 400 K/uL   nRBC 0.0 0.0 - 0.2 %  Basic metabolic panel     Status: Abnormal   Collection Time: 11/21/20  6:12 AM  Result Value Ref Range   Sodium 135 135 - 145 mmol/L   Potassium 4.3 3.5 - 5.1 mmol/L   Chloride 104 98 - 111 mmol/L   CO2 23 22 - 32 mmol/L   Glucose, Bld 200 (H) 70 - 99 mg/dL   BUN 18 6 - 20 mg/dL   Creatinine, Ser 0.82 0.44 - 1.00 mg/dL   Calcium 8.5 (L) 8.9 - 10.3 mg/dL   GFR, Estimated >60 >60 mL/min   Anion gap 8 5 - 15  Glucose, capillary     Status: Abnormal   Collection Time: 11/21/20  7:50 AM  Result Value Ref Range   Glucose-Capillary 228 (H) 70 - 99 mg/dL  Glucose, capillary     Status: Abnormal   Collection Time: 11/21/20 12:10 PM  Result Value Ref Range   Glucose-Capillary 155 (H) 70 - 99 mg/dL    Recent Results (from the past 240 hour(s))  Resp Panel by RT-PCR (Flu A&B, Covid) Nasopharyngeal Swab     Status: None   Collection Time: 11/19/20  4:58 PM   Specimen: Nasopharyngeal Swab; Nasopharyngeal(NP) swabs in vial  transport medium  Result Value Ref Range Status   SARS Coronavirus 2 by RT PCR NEGATIVE NEGATIVE Final    Comment: (NOTE) SARS-CoV-2 target nucleic acids are NOT DETECTED.  The SARS-CoV-2 RNA is generally detectable in upper respiratory specimens during the acute phase of infection. The lowest concentration of SARS-CoV-2 viral copies this assay can detect is 138 copies/mL. A negative result does not preclude SARS-Cov-2 infection and should not be used as the sole basis for treatment or other patient management decisions. A negative result may occur with  improper specimen collection/handling, submission of specimen other than nasopharyngeal swab, presence of viral mutation(s) within the areas targeted by this assay, and inadequate number of viral copies(<138 copies/mL). A negative result must be combined with clinical observations, patient history, and epidemiological information. The  expected result is Negative.  Fact Sheet for Patients:  EntrepreneurPulse.com.au  Fact Sheet for Healthcare Providers:  IncredibleEmployment.be  This test is no t yet approved or cleared by the [place] FDA and  has been authorized for detection and/or diagnosis of SARS-CoV-2 by FDA under an Emergency Use Authorization (EUA). This EUA will remain  in effect (meaning this test can be used) for the duration of the COVID-19 declaration under Section 564(b)(1) of the Act, 21 U.S.C.section 360bbb-3(b)(1), unless the authorization is terminated  or revoked sooner.       Influenza A by PCR NEGATIVE NEGATIVE Final   Influenza B by PCR NEGATIVE NEGATIVE Final    Comment: (NOTE) The Xpert Xpress SARS-CoV-2/FLU/RSV plus assay is intended as an aid in the diagnosis of influenza from Nasopharyngeal swab specimens and should not be used as a sole basis for treatment. Nasal washings and aspirates are unacceptable for Xpert Xpress SARS-CoV-2/FLU/RSV testing.  Fact  Sheet for Patients: EntrepreneurPulse.com.au  Fact Sheet for Healthcare Providers: IncredibleEmployment.be  This test is not yet approved or cleared by the [place] FDA and has been authorized for detection and/or diagnosis of SARS-CoV-2 by FDA under an Emergency Use Authorization (EUA). This EUA will remain in effect (meaning this test can be used) for the duration of the COVID-19 declaration under Section 564(b)(1) of the Act, 21 U.S.C. section 360bbb-3(b)(1), unless the authorization is terminated or revoked.  Performed at [facility], [place] [address] [place], [place] [address]      Radiology Studies: No results found. No orders to display    Scheduled Meds: . atorvastatin  10 mg Oral Daily  . dicyclomine  20 mg Oral BID  . insulin aspart  0-15 Units Subcutaneous TID WC  . insulin aspart  0-5 Units Subcutaneous QHS  . insulin glargine  20 Units Subcutaneous QHS  . pantoprazole (PROTONIX) IV  40 mg Intravenous BID  . peg 3350 powder  0.5 kit Oral Once   And  . peg 3350 powder  0.5 kit Oral Once   PRN Meds: acetaminophen **OR** acetaminophen, famotidine, ondansetron **OR** ondansetron (ZOFRAN) IV Continuous Infusions: . lactated ringers 50 mL/hr at 11/20/20 2049     LOS: 0 days  Time spent: Greater than 50% of the 35 minute visit was spent in counseling/coordination of care for the patient as laid out in the A&P.   Dwyane Dee, MD [facility] 11/21/2020, 1:32 PM

## 2020-11-21 NOTE — Assessment & Plan Note (Addendum)
-  Patient endorsing worsening indigestion and reflux -Discharged with Protonix

## 2020-11-21 NOTE — H&P (View-Only) (Signed)
Progress Note   Subjective  Chief Complaint: GI bleed, presumed diverticular  This morning, patient tells me that she had 11 bloody bowel movements yesterday with the last around 8:00 after drinking a sip of water.  Apparently she tries to put anything in her mouth and she just has a bloody bowel movement.  Tells me she continues to feel dizzy with generalized abdominal discomfort.  Also complains of some heartburn/reflux symptoms when her stomach is empty.  Apparently was on pantoprazole a couple years ago but has not been on anything lately.   Objective   Vital signs in last 24 hours: Temp:  [98.2 F (36.8 C)-99.3 F (37.4 C)] 99.3 F (37.4 C) (01/06 0655) Pulse Rate:  [74-105] 105 (01/06 0655) Resp:  [16-18] 18 (01/06 0655) BP: (108-140)/(63-89) 114/84 (01/06 0655) SpO2:  [97 %-99 %] 98 % (01/06 0655) Last BM Date: 11/21/20 General:    AA female in NAD Heart:  Regular rate and rhythm; no murmurs Lungs: Respirations even and unlabored, lungs CTA bilaterally Abdomen:  Soft, mild generalized TTP and nondistended. Normal bowel sounds. Extremities:  Without edema. Neurologic:  Alert and oriented,  grossly normal neurologically. Psych:  Cooperative. Normal mood and affect.  Intake/Output from previous day: 01/05 0701 - 01/06 0700 In: 2855.6 [P.O.:1680; I.V.:1175.6] Out: -   Lab Results: Recent Labs    11/19/20 1314 11/19/20 1559 11/20/20 0747 11/21/20 0612  WBC 11.3*  --  9.2 7.8  HGB 12.6 11.9* 11.0* 9.5*  HCT 42.0 39.1 35.8* 31.8*  PLT 283  --  253 271   BMET Recent Labs    11/19/20 1314 11/20/20 0747 11/21/20 0612  NA 139 134* 135  K 4.2 4.1 4.3  CL 103 100 104  CO2 27 24 23   GLUCOSE 156* 152* 200*  BUN 23* 23* 18  CREATININE 1.00 0.91 0.82  CALCIUM 9.6 9.0 8.5*   LFT Recent Labs    11/20/20 0747  PROT 7.9  ALBUMIN 4.1  AST 17  ALT 16  ALKPHOS 49  BILITOT 0.6    Assessment / Plan:   Assessment: 1.  Recurrent diverticular bleed: At  presentation hemoglobin 12.6--> 11.0 --> 9.5 with 11 bloody bowel movements yesterday (making a total of 17), the last around 8 PM 11/20/2020 2.  Diabetes type 2 3.  Cushing's disease  Plan: 1.  Hospitalist informed us that he talked with the surgical team who are requesting a colonoscopy prior to discussing any further intervention such as subtotal colectomy.  Patient did have a colonoscopy at the end of 2020 with pandiverticular disease. Will discuss further with Dr. Fuller Plan. 2.  For now patient can be on a clear liquid diet 3.  Please continue to monitor hemoglobin and transfusion as needed less than 7 4.  Again would recommend a CTA stat or nuclear tagged red blood cell scan for recurrent brisk bleeding and IR consultation if the bleeding site is located. 5.  Added Pepcid 20 mg twice daily as needed for heartburn symptoms. 6.  Please await further recommendations from Dr. Fuller Plan later today  Thank you for kind consultation, we will continue to follow.    LOS: 0 days   Levin Erp  11/21/2020, 10:00 AM      Attending Physician Note   I have taken an interval history, reviewed the chart and examined the patient. I agree with the Advanced Practitioner's note, impression and recommendations.   * Persistent, active LGI bleeding suspected diverticular bleeding. If bleeding is more  brisk proceed with CTA. Schedule colonoscopy for tomorrow to further evaluate.  * ABL anemia. Trend CBC. Transfuse as indicated. * GERD on Pepcid 20 mg bid.  Claudette Head, MD [facility]

## 2020-11-22 ENCOUNTER — Encounter (HOSPITAL_COMMUNITY): Payer: Self-pay | Admitting: Internal Medicine

## 2020-11-22 ENCOUNTER — Encounter (HOSPITAL_COMMUNITY)
Admission: EM | Disposition: A | Discharge status: Home / Self Care | Payer: Self-pay | Source: Home / Self Care | Attending: Internal Medicine

## 2020-11-22 ENCOUNTER — Inpatient Hospital Stay (HOSPITAL_COMMUNITY): Payer: No Typology Code available for payment source | Admitting: Certified Registered"

## 2020-11-22 DIAGNOSIS — D123 Benign neoplasm of transverse colon: Secondary | ICD-10-CM

## 2020-11-22 DIAGNOSIS — D121 Benign neoplasm of appendix: Secondary | ICD-10-CM

## 2020-11-22 DIAGNOSIS — K922 Gastrointestinal hemorrhage, unspecified: Secondary | ICD-10-CM | POA: Diagnosis not present

## 2020-11-22 HISTORY — PX: COLONOSCOPY WITH PROPOFOL: SHX5780

## 2020-11-22 HISTORY — PX: POLYPECTOMY: SHX5525

## 2020-11-22 LAB — TYPE AND SCREEN
ABO/RH(D): O POS
Antibody Screen: NEGATIVE

## 2020-11-22 LAB — BASIC METABOLIC PANEL
Anion gap: 10 (ref 5–15)
BUN: 13 mg/dL (ref 6–20)
CO2: 20 mmol/L — ABNORMAL LOW (ref 22–32)
Calcium: 8.6 mg/dL — ABNORMAL LOW (ref 8.9–10.3)
Chloride: 106 mmol/L (ref 98–111)
Creatinine, Ser: 0.86 mg/dL (ref 0.44–1.00)
GFR, Estimated: >60 mL/min (ref >60)
Glucose, Bld: 192 mg/dL — ABNORMAL HIGH (ref 70–99)
Potassium: 4 mmol/L (ref 3.5–5.1)
Sodium: 136 mmol/L (ref 135–145)

## 2020-11-22 LAB — HEMOGLOBIN AND HEMATOCRIT, BLOOD
HCT: 22.4 % — ABNORMAL LOW (ref 36.0–46.0)
HCT: 27.7 % — ABNORMAL LOW (ref 36.0–46.0)
HCT: 28 % — ABNORMAL LOW (ref 36.0–46.0)
Hemoglobin: 6.8 g/dL — CL (ref 12.0–15.0)
Hemoglobin: 7.9 g/dL — ABNORMAL LOW (ref 12.0–15.0)
Hemoglobin: 8.6 g/dL — ABNORMAL LOW (ref 12.0–15.0)

## 2020-11-22 LAB — GLUCOSE, CAPILLARY
Glucose-Capillary: 187 mg/dL — ABNORMAL HIGH (ref 70–99)
Glucose-Capillary: 129 mg/dL — ABNORMAL HIGH (ref 70–99)
Glucose-Capillary: 111 mg/dL — ABNORMAL HIGH (ref 70–99)
Glucose-Capillary: 96 mg/dL (ref 70–99)
Glucose-Capillary: 144 mg/dL — ABNORMAL HIGH (ref 70–99)

## 2020-11-22 LAB — PREPARE RBC (CROSSMATCH)

## 2020-11-22 LAB — MAGNESIUM: Magnesium: 1.7 mg/dL (ref 1.7–2.4)

## 2020-11-22 SURGERY — COLONOSCOPY WITH PROPOFOL
Anesthesia: Monitor Anesthesia Care

## 2020-11-22 MED ORDER — SODIUM CHLORIDE 0.9% IV SOLUTION: Freq: Once | INTRAVENOUS | Status: AC

## 2020-11-22 MED ORDER — PROPOFOL 1000 MG/100ML IV EMUL
INTRAVENOUS | Status: AC
  Filled 2020-11-22: qty 100

## 2020-11-22 MED ORDER — PROPOFOL 10 MG/ML IV BOLUS
INTRAVENOUS | Status: DC | PRN
  Administered 2020-11-22: 10 mg via INTRAVENOUS

## 2020-11-22 MED ORDER — LIDOCAINE 2% (20 MG/ML) 5 ML SYRINGE
INTRAMUSCULAR | Status: DC | PRN
  Administered 2020-11-22: 40 mg via INTRAVENOUS

## 2020-11-22 MED ORDER — PROPOFOL 10 MG/ML IV BOLUS
INTRAVENOUS | Status: AC
  Filled 2020-11-22: qty 20

## 2020-11-22 MED ORDER — PROPOFOL 500 MG/50ML IV EMUL
INTRAVENOUS | Status: DC | PRN
  Administered 2020-11-22: 125 ug/kg/min via INTRAVENOUS

## 2020-11-22 MED ORDER — SODIUM CHLORIDE 0.9 % IV SOLN: INTRAVENOUS | Status: DC

## 2020-11-22 SURGICAL SUPPLY — 22 items

## 2020-11-22 NOTE — Progress Notes (Addendum)
Critical Lab HGB 6.8; Blount, NP notified. No new orders. Will continue to monitor.

## 2020-11-22 NOTE — Anesthesia Procedure Notes (Signed)
Procedure Name: MAC Date/Time: 11/22/2020 1:07 PM Performed by: Eben Burow, CRNA Pre-anesthesia Checklist: Patient identified, Emergency Drugs available, Suction available, Patient being monitored and Timeout performed Oxygen Delivery Method: Simple face mask Placement Confirmation: positive ETCO2

## 2020-11-22 NOTE — Op Note (Signed)
[facility] Patient Name: Carol Mueller Procedure Date: 11/22/2020 MRN: [id number] Attending MD: Meryl Dare , MD Date of Birth: 1961/01/13 CSN: [id number] Age: 60 Admit Type: Inpatient Procedure:                Colonoscopy Indications:              Hematochezia Providers:                Venita Lick. Russella Dar, MD, Blenda Mounts, RN, Harrington Challenger, Technician Referring MD:             [facility] Medicines:                Monitored Anesthesia Care Complications:            No immediate complications. Estimated blood loss:                            None. Estimated Blood Loss:     Estimated blood loss: none. Procedure:                Pre-Anesthesia Assessment:                           - Prior to the procedure, a History and Physical                            was performed, and patient medications and                            allergies were reviewed. The patient's tolerance of                            previous anesthesia was also reviewed. The risks                            and benefits of the procedure and the sedation                            options and risks were discussed with the patient.                            All questions were answered, and informed consent                            was obtained. Prior Anticoagulants: The patient has                            taken no previous anticoagulant or antiplatelet                            agents. ASA Grade Assessment: III - A patient with                            severe systemic disease. After reviewing  the risks                            and benefits, the patient was deemed in                            satisfactory condition to undergo the procedure.                           After obtaining informed consent, the colonoscope                            was passed under direct vision. Throughout the                            procedure, the patient's blood pressure, pulse, and                             oxygen saturations were monitored continuously. The                            CF-HQ190L ([id number]) Olympus colonoscope was                            introduced through the anus and advanced to the the                            cecum, identified by appendiceal orifice and                            ileocecal valve. The ileocecal valve, appendiceal                            orifice, and rectum were photographed. The quality                            of the bowel preparation was adequate. The                            colonoscopy was performed without difficulty. The                            patient tolerated the procedure well. Scope In: 1:13:14 PM Scope Out: 1:38:57 PM Scope Withdrawal Time: 0 hours 21 minutes 28 seconds  Total Procedure Duration: 0 hours 25 minutes 43 seconds  Findings:      The perianal and digital rectal examinations were normal.      A 9 mm polyp was found in the appendiceal orifice. The polyp was       pedunculated. It prolapsed in and out of the appendix lumen. The polyp       was removed with a cold snare. Resection and retrieval were complete.      A 6 mm polyp was found in the transverse colon. The polyp was sessile.       The polyp was removed with a cold snare. Resection and  retrieval were       complete.      Internal hemorrhoids were found during retroflexion. The hemorrhoids       were moderate and nonbleeding.      Multiple medium-mouthed diverticula were found in the entire colon.       There was evidence of an impacted diverticulum. There was no evidence of       diverticular bleeding.      The exam was otherwise without abnormality on direct and retroflexion       views. Impression:               - One 9 mm polyp at the appendiceal orifice,                            removed with a cold snare. Resected and retrieved.                           - One 6 mm polyp in the transverse colon, removed                            with a  cold snare. Resected and retrieved.                           - Internal hemorrhoids.                           - Moderate diverticulosis in the entire examined                            colon. There was evidence of an impacted                            diverticulum. There was no evidence of diverticular                            bleeding. No blood in the colon.                           - The exam was otherwise normal on direct and                            retroflexion views. Moderate Sedation:      Not Applicable - Patient had care per Anesthesia. Recommendation:           - Return patient to hospital ward for ongoing care.                           - Repeat colonoscopy after studies are complete for                            surveillance based on pathology results with Dr.                            Oretha Caprice.                           -  The signs and symptoms of potential delayed                            complications were discussed with the patient.                           - Full liquid diet.                           - Continue present medications.                           - Suspected diverticular bleed that has now                            stopped. Observe for rebleeding.                           - Await pathology results.                           - No aspirin, ibuprofen, naproxen, or other                            non-steroidal anti-inflammatory drugs for 2 weeks                            after polyp removal. Procedure Code(s):        --- Professional ---                           [phone], Colonoscopy, flexible; with removal of                            tumor(s), polyp(s), or other lesion(s) by snare                            technique Diagnosis Code(s):        --- Professional ---                           K63.5, Polyp of colon                           K64.8, Other hemorrhoids                           K92.1, Melena (includes Hematochezia)                            K57.30, Diverticulosis of large intestine without                            perforation or abscess without bleeding CPT copyright 2019 American Medical Association. All rights reserved. The codes documented in this report are preliminary and upon coder review may  be revised to meet current compliance requirements. Ladene Artist, MD 11/22/2020 1:53:36 PM This  report has been signed electronically. Number of Addenda: 0

## 2020-11-22 NOTE — Transfer of Care (Signed)
Immediate Anesthesia Transfer of Care Note  Patient: Carol Mueller  Procedure(s) Performed: COLONOSCOPY WITH PROPOFOL (N/A ) POLYPECTOMY  Patient Location: PACU and Endoscopy Unit  Anesthesia Type:MAC  Level of Consciousness: awake, alert  and patient cooperative  Airway & Oxygen Therapy: Patient Spontanous Breathing and Patient connected to face mask oxygen  Post-op Assessment: Report given to RN and Post -op Vital signs reviewed and stable  Post vital signs: Reviewed and stable  Last Vitals:  Vitals Value Taken Time  BP    Temp    Pulse    Resp    SpO2      Last Pain:  Vitals:   11/22/20 1234  TempSrc: Oral  PainSc: 0-No pain         Complications: No complications documented.

## 2020-11-22 NOTE — Interval H&P Note (Signed)
History and Physical Interval Note:  11/22/2020 12:55 PM  Carol Mueller  has presented today for surgery, with the diagnosis of Gi Bleed.  The various methods of treatment have been discussed with the patient and family. After consideration of risks, benefits and other options for treatment, the patient has consented to  Procedure(s): COLONOSCOPY WITH PROPOFOL (N/A) as a surgical intervention.  The patient's history has been reviewed, patient examined, no change in status, stable for surgery.  I have reviewed the patient's chart and labs.  Questions were answered to the patient's satisfaction.     Pricilla Riffle. Fuller Plan

## 2020-11-22 NOTE — Progress Notes (Cosign Needed)
Colonoscopy without active diverticular or hemorrhoidal bleeding. Agree with FLD and monitoring for re-bleeding. No indication for emergent total abdominal colectomy at this time. If patient wishes to consider this electively then she should call and schedule to see a colorectal surgeon in our office, I will make sure this information is included in AVS. General surgery will sign off for now, but please call if we can be of further assistance.   Norm Parcel, [facility] 11/22/2020, 2:56 PM Please see Amion for pager number during day hours 7:00am-4:30pm

## 2020-11-22 NOTE — Progress Notes (Signed)
PROGRESS NOTE    SHARENE PINKHAM   [id number]  DOB: [birth year]  DOA: 11/19/2020     1  PCP: Carol Morning, DO  CC: rectal bleeding  Hospital Course: Ms. Carol Mueller is a 60 yo female with PMH diverticulosis, Cushing syndrome (s/p open adrenalectomy), type 2 diabetes, hyperlipidemia, hypertension, PCOD who presented to the hospital with recurrent bright red blood per rectum.  This has been a similar problem in the past associated with diverticulosis.  She has been recommended to have outpatient evaluation with general surgery to discuss elective subtotal colectomy, however she has been against this due to difficulty locating source of her bleeding. She was admitted for further hemoglobin monitoring and possible need for inpatient surgical evaluation vs bleeding scan if able to locate source.   Hgb had been stable until 11/21/2020 when she started having recurrent multiple bloody bowel movements. General surgery was consulted in case of need for emergent intervention and she was scheduled for undergoing repeat colonoscopy with GI on 11/22/2020.   Interval History:  Hemoglobin was 6.8 g/dL this Mueller and 2 units PRBC were ordered.  She is also planning on still undergoing colonoscopy this afternoon.  She stated that the bowel movements overnight with the bowel prep were bloody but have turned clear this Mueller.  Old records reviewed in assessment of this patient  ROS: Constitutional: negative for chills and fevers, Respiratory: negative for cough, Cardiovascular: negative for chest pain and Gastrointestinal: negative for abdominal pain  Assessment & Plan: * GI bleed -Patient reports bright red blood per rectum prior to admission.  She states this has been a recurrent problem for approx 3 months (bleeding almost monthly).  Source appears to be underlying diverticulosis.  GI has recommended patient to consider surgical evaluation for possible elective subtotal colectomy. Have not been able to  locate specific area of her bleeding as of yet - continue trending hemoglobin -Transfuse for hemoglobin less than 7 g/dL -had been relatively stable until evening of 1/5 when she had ~11 bloody bowel movements  - Hgb down to 6.8 g/dL on 1/7; 2 units PRBC ordered - will consult general surgery at this time in case of need for intervention - GI planning for repeat CLN on 1/7 per surgery rec's as well  - follow up CLN results   GERD (gastroesophageal reflux disease) -Patient endorsing worsening indigestion and reflux -Start on IV Protonix twice daily -PRN pepcid also added per GI  Hyperlipidemia with target LDL less than 100 -Continue statin  Essential hypertension -Continue Coreg -Benicar on hold  Diabetes mellitus ([place]) -Continue Lantus, SSI, and CBG monitoring   Antimicrobials: N/A  DVT prophylaxis: SCD Code Status: Full Family Communication: None present Disposition Plan: Status is: Inpatient  Remains inpatient appropriate because:Ongoing diagnostic testing needed not appropriate for outpatient work up, IV treatments appropriate due to intensity of illness or inability to take PO and Inpatient level of care appropriate due to severity of illness   Dispo: The patient is from: Home              Anticipated d/c is to: Home              Anticipated d/c date is: 2 days              Patient currently is not medically stable to d/c.  Objective: Blood pressure (!) 156/89, pulse 94, temperature 99.2 F (37.3 C), temperature source Oral, resp. rate (!) 22, height 5\' 2"  (1.575 m), weight 111 kg, SpO2 99 %.  Examination: General appearance: alert, cooperative and no distress Head: Normocephalic, without obvious abnormality, atraumatic Eyes: EOMI Lungs: clear to auscultation bilaterally Heart: regular rate and rhythm and S1, S2 normal Abdomen: obese, soft, TTP nonspecifically throughout; BS present Extremities: No lower extremity edema Skin: mobility and turgor  normal Neurologic: Grossly normal  Consultants:   Gastroenterology  General surgery   Procedures:   CLN: 11/22/20 - tentative   Data Reviewed: I have personally reviewed following labs and imaging studies Results for orders placed or performed during the hospital encounter of 11/19/20 (from the past 24 hour(s))  Glucose, capillary     Status: Abnormal   Collection Time: 11/21/20  5:29 PM  Result Value Ref Range   Glucose-Capillary 162 (H) 70 - 99 mg/dL  Hemoglobin and hematocrit, blood     Status: Abnormal   Collection Time: 11/21/20  6:03 PM  Result Value Ref Range   Hemoglobin 7.5 (L) 12.0 - 15.0 g/dL   HCT 24.5 (L) 36.0 - 46.0 %  Glucose, capillary     Status: Abnormal   Collection Time: 11/21/20  9:13 PM  Result Value Ref Range   Glucose-Capillary 156 (H) 70 - 99 mg/dL  Hemoglobin and hematocrit, blood     Status: Abnormal   Collection Time: 11/22/20 12:38 AM  Result Value Ref Range   Hemoglobin 7.9 (L) 12.0 - 15.0 g/dL   HCT 28.0 (L) 36.0 - 64.3 %  Basic metabolic panel     Status: Abnormal   Collection Time: 11/22/20 12:38 AM  Result Value Ref Range   Sodium 136 135 - 145 mmol/L   Potassium 4.0 3.5 - 5.1 mmol/L   Chloride 106 98 - 111 mmol/L   CO2 20 (L) 22 - 32 mmol/L   Glucose, Bld 192 (H) 70 - 99 mg/dL   BUN 13 6 - 20 mg/dL   Creatinine, Ser 0.86 0.44 - 1.00 mg/dL   Calcium 8.6 (L) 8.9 - 10.3 mg/dL   GFR, Estimated >60 >60 mL/min   Anion gap 10 5 - 15  Magnesium     Status: None   Collection Time: 11/22/20 12:38 AM  Result Value Ref Range   Magnesium 1.7 1.7 - 2.4 mg/dL  Hemoglobin and hematocrit, blood     Status: Abnormal   Collection Time: 11/22/20  5:47 AM  Result Value Ref Range   Hemoglobin 6.8 (LL) 12.0 - 15.0 g/dL   HCT 22.4 (L) 36.0 - 46.0 %  Glucose, capillary     Status: Abnormal   Collection Time: 11/22/20  7:28 AM  Result Value Ref Range   Glucose-Capillary 187 (H) 70 - 99 mg/dL  Type and screen [place]     Status:  None   Collection Time: 11/22/20  7:57 AM  Result Value Ref Range   ABO/RH(D) O POS    Antibody Screen NEG    Sample Expiration      11/25/2020,2359 Performed at [facility], [place] [address], [place], [place] [address]   Prepare RBC (crossmatch)     Status: None   Collection Time: 11/22/20  7:57 AM  Result Value Ref Range   Order Confirmation      ORDER PROCESSED BY BLOOD BANK Performed at [facility], [place] [address] [place], [place] [address]   Glucose, capillary     Status: Abnormal   Collection Time: 11/22/20 11:47 AM  Result Value Ref Range   Glucose-Capillary 129 (H) 70 - 99 mg/dL    Recent  Results (from the past 240 hour(s))  Resp Panel by RT-PCR (Flu A&B, Covid) Nasopharyngeal Swab     Status: None   Collection Time: 11/19/20  4:58 PM   Specimen: Nasopharyngeal Swab; Nasopharyngeal(NP) swabs in vial transport medium  Result Value Ref Range Status   SARS Coronavirus 2 by RT PCR NEGATIVE NEGATIVE Final    Comment: (NOTE) SARS-CoV-2 target nucleic acids are NOT DETECTED.  The SARS-CoV-2 RNA is generally detectable in upper respiratory specimens during the acute phase of infection. The lowest concentration of SARS-CoV-2 viral copies this assay can detect is 138 copies/mL. A negative result does not preclude SARS-Cov-2 infection and should not be used as the sole basis for treatment or other patient management decisions. A negative result may occur with  improper specimen collection/handling, submission of specimen other than nasopharyngeal swab, presence of viral mutation(s) within the areas targeted by this assay, and inadequate number of viral copies(<138 copies/mL). A negative result must be combined with clinical observations, patient history, and epidemiological information. The expected result is Negative.  Fact Sheet for Patients:  EntrepreneurPulse.com.au  Fact Sheet for Healthcare Providers:   IncredibleEmployment.be  This test is no t yet approved or cleared by the [place] FDA and  has been authorized for detection and/or diagnosis of SARS-CoV-2 by FDA under an Emergency Use Authorization (EUA). This EUA will remain  in effect (meaning this test can be used) for the duration of the COVID-19 declaration under Section 564(b)(1) of the Act, 21 U.S.C.section 360bbb-3(b)(1), unless the authorization is terminated  or revoked sooner.       Influenza A by PCR NEGATIVE NEGATIVE Final   Influenza B by PCR NEGATIVE NEGATIVE Final    Comment: (NOTE) The Xpert Xpress SARS-CoV-2/FLU/RSV plus assay is intended as an aid in the diagnosis of influenza from Nasopharyngeal swab specimens and should not be used as a sole basis for treatment. Nasal washings and aspirates are unacceptable for Xpert Xpress SARS-CoV-2/FLU/RSV testing.  Fact Sheet for Patients: EntrepreneurPulse.com.au  Fact Sheet for Healthcare Providers: IncredibleEmployment.be  This test is not yet approved or cleared by the [place] FDA and has been authorized for detection and/or diagnosis of SARS-CoV-2 by FDA under an Emergency Use Authorization (EUA). This EUA will remain in effect (meaning this test can be used) for the duration of the COVID-19 declaration under Section 564(b)(1) of the Act, 21 U.S.C. section 360bbb-3(b)(1), unless the authorization is terminated or revoked.  Performed at [facility], [place] [address], [place], [place] [address]      Radiology Studies: No results found. No orders to display    Scheduled Meds: . [MAR Hold] atorvastatin  10 mg Oral Daily  . [MAR Hold] dicyclomine  20 mg Oral BID  . [MAR Hold] insulin aspart  0-15 Units Subcutaneous TID WC  . [MAR Hold] insulin aspart  0-5 Units Subcutaneous QHS  . [MAR Hold] insulin glargine  20 Units Subcutaneous QHS  . [MAR Hold] pantoprazole (PROTONIX)  IV  40 mg Intravenous BID   PRN Meds: [MAR Hold] acetaminophen **OR** [MAR Hold] acetaminophen, [MAR Hold] famotidine, [MAR Hold] ondansetron **OR** [MAR Hold] ondansetron (ZOFRAN) IV Continuous Infusions: . sodium chloride    . lactated ringers 50 mL/hr at 11/22/20 0150     LOS: 1 day  Time spent: Greater than 50% of the 35 minute visit was spent in counseling/coordination of care for the patient as laid out in the A&P.   Dwyane Dee, MD [facility] 11/22/2020, 12:40 PM

## 2020-11-22 NOTE — Anesthesia Preprocedure Evaluation (Addendum)
Anesthesia Evaluation  Patient identified by MRN, date of birth, ID band Patient awake    Reviewed: Allergy & Precautions, NPO status , Patient's Chart, lab work & pertinent test results, reviewed documented beta blocker date and time   Airway Mallampati: II  TM Distance: >3 FB Neck ROM: Full    Dental  (+) Teeth Intact, Dental Advisory Given   Pulmonary neg pulmonary ROS,    Pulmonary exam normal breath sounds clear to auscultation       Cardiovascular hypertension, Pt. on home beta blockers and Pt. on medications Normal cardiovascular exam Rhythm:Regular Rate:Normal     Neuro/Psych negative neurological ROS     GI/Hepatic Neg liver ROS, GERD  ,  Endo/Other  diabetes, Type 2, Oral Hypoglycemic Agents, Insulin DependentCushings disease  Renal/GU negative Renal ROS     Musculoskeletal negative musculoskeletal ROS (+)   Abdominal   Peds  Hematology  (+) Blood dyscrasia, anemia ,   Anesthesia Other Findings Day of surgery medications reviewed with the patient.  Reproductive/Obstetrics                             Anesthesia Physical Anesthesia Plan  ASA: III  Anesthesia Plan: MAC   Post-op Pain Management:    Induction: Intravenous  PONV Risk Score and Plan: 2 and Propofol infusion and Treatment may vary due to age or medical condition  Airway Management Planned: Nasal Cannula and Natural Airway  Additional Equipment:   Intra-op Plan:   Post-operative Plan:   Informed Consent: I have reviewed the patients History and Physical, chart, labs and discussed the procedure including the risks, benefits and alternatives for the proposed anesthesia with the patient or authorized representative who has indicated his/her understanding and acceptance.     Dental advisory given  Plan Discussed with: CRNA and Anesthesiologist  Anesthesia Plan Comments: (Discussed  risks/benefits/alternatives to MAC sedation including need for ventilatory support, hypotension, need for conversion to general anesthesia.  All patient questions answered.  Patient/guardian wishes to proceed.)        Anesthesia Quick Evaluation

## 2020-11-22 NOTE — Anesthesia Postprocedure Evaluation (Signed)
Anesthesia Post Note  Patient: Carol Mueller  Procedure(s) Performed: COLONOSCOPY WITH PROPOFOL (N/A ) POLYPECTOMY     Patient location during evaluation: Endoscopy Anesthesia Type: MAC Level of consciousness: awake and alert Pain management: pain level controlled Vital Signs Assessment: post-procedure vital signs reviewed and stable Respiratory status: spontaneous breathing, nonlabored ventilation, respiratory function stable and patient connected to nasal cannula oxygen Cardiovascular status: stable and blood pressure returned to baseline Postop Assessment: no apparent nausea or vomiting Anesthetic complications: no   No complications documented.  Last Vitals:  Vitals:   11/22/20 1234 11/22/20 1346  BP: (!) 156/89 (!) 124/56  Pulse: 94 96  Resp: (!) 22 (!) 29  Temp: 37.3 C 37 C  SpO2: 99% 100%    Last Pain:  Vitals:   11/22/20 1346  TempSrc: Oral  PainSc: 0-No pain                 Catalina Gravel

## 2020-11-23 ENCOUNTER — Inpatient Hospital Stay (HOSPITAL_COMMUNITY)
Admission: EM | Admit: 2020-11-23 | Discharge: 2020-11-29 | DRG: 378 | Disposition: A | Discharge status: Home / Self Care | Payer: No Typology Code available for payment source | Attending: Internal Medicine | Admitting: Internal Medicine

## 2020-11-23 ENCOUNTER — Emergency Department (HOSPITAL_COMMUNITY): Payer: No Typology Code available for payment source

## 2020-11-23 ENCOUNTER — Other Ambulatory Visit: Payer: Self-pay

## 2020-11-23 ENCOUNTER — Encounter (HOSPITAL_COMMUNITY): Payer: Self-pay

## 2020-11-23 DIAGNOSIS — K625 Hemorrhage of anus and rectum: Secondary | ICD-10-CM

## 2020-11-23 DIAGNOSIS — E785 Hyperlipidemia, unspecified: Secondary | ICD-10-CM | POA: Diagnosis present

## 2020-11-23 DIAGNOSIS — D509 Iron deficiency anemia, unspecified: Secondary | ICD-10-CM

## 2020-11-23 DIAGNOSIS — I152 Hypertension secondary to endocrine disorders: Secondary | ICD-10-CM | POA: Diagnosis present

## 2020-11-23 DIAGNOSIS — K921 Melena: Secondary | ICD-10-CM

## 2020-11-23 DIAGNOSIS — D62 Acute posthemorrhagic anemia: Secondary | ICD-10-CM | POA: Diagnosis present

## 2020-11-23 DIAGNOSIS — E1159 Type 2 diabetes mellitus with other circulatory complications: Secondary | ICD-10-CM | POA: Diagnosis present

## 2020-11-23 DIAGNOSIS — K317 Polyp of stomach and duodenum: Secondary | ICD-10-CM | POA: Diagnosis present

## 2020-11-23 DIAGNOSIS — E119 Type 2 diabetes mellitus without complications: Secondary | ICD-10-CM | POA: Diagnosis present

## 2020-11-23 DIAGNOSIS — Z7984 Long term (current) use of oral hypoglycemic drugs: Secondary | ICD-10-CM

## 2020-11-23 DIAGNOSIS — I10 Essential (primary) hypertension: Secondary | ICD-10-CM | POA: Diagnosis present

## 2020-11-23 DIAGNOSIS — Z794 Long term (current) use of insulin: Secondary | ICD-10-CM

## 2020-11-23 DIAGNOSIS — Z20822 Contact with and (suspected) exposure to covid-19: Secondary | ICD-10-CM | POA: Diagnosis present

## 2020-11-23 DIAGNOSIS — K5731 Diverticulosis of large intestine without perforation or abscess with bleeding: Secondary | ICD-10-CM | POA: Diagnosis not present

## 2020-11-23 DIAGNOSIS — D500 Iron deficiency anemia secondary to blood loss (chronic): Secondary | ICD-10-CM

## 2020-11-23 DIAGNOSIS — K922 Gastrointestinal hemorrhage, unspecified: Secondary | ICD-10-CM | POA: Diagnosis not present

## 2020-11-23 DIAGNOSIS — E876 Hypokalemia: Secondary | ICD-10-CM | POA: Diagnosis present

## 2020-11-23 DIAGNOSIS — Z79899 Other long term (current) drug therapy: Secondary | ICD-10-CM

## 2020-11-23 DIAGNOSIS — D649 Anemia, unspecified: Secondary | ICD-10-CM

## 2020-11-23 DIAGNOSIS — Z9049 Acquired absence of other specified parts of digestive tract: Secondary | ICD-10-CM

## 2020-11-23 DIAGNOSIS — Z6841 Body Mass Index (BMI) 40.0 and over, adult: Secondary | ICD-10-CM

## 2020-11-23 DIAGNOSIS — Q398 Other congenital malformations of esophagus: Secondary | ICD-10-CM

## 2020-11-23 LAB — CBC
HCT: 27.3 % — ABNORMAL LOW (ref 36.0–46.0)
Hemoglobin: 8.4 g/dL — ABNORMAL LOW (ref 12.0–15.0)
MCH: 24 pg — ABNORMAL LOW (ref 26.0–34.0)
MCHC: 30.8 g/dL (ref 30.0–36.0)
MCV: 78 fL — ABNORMAL LOW (ref 80.0–100.0)
Platelets: 252 10*3/uL (ref 150–400)
RBC: 3.5 MIL/uL — ABNORMAL LOW (ref 3.87–5.11)
RDW: 18.2 % — ABNORMAL HIGH (ref 11.5–15.5)
WBC: 11.4 10*3/uL — ABNORMAL HIGH (ref 4.0–10.5)
nRBC: 0.3 % — ABNORMAL HIGH (ref 0.0–0.2)

## 2020-11-23 LAB — TYPE AND SCREEN
ABO/RH(D): O POS
Antibody Screen: NEGATIVE
Unit division: 0
Unit division: 0

## 2020-11-23 LAB — BPAM RBC
Blood Product Expiration Date: 202202042359
Blood Product Expiration Date: 202202042359
ISSUE DATE / TIME: 202201070840
ISSUE DATE / TIME: 202201071037
Unit Type and Rh: 5100
Unit Type and Rh: 5100

## 2020-11-23 LAB — HEMOGLOBIN AND HEMATOCRIT, BLOOD
HCT: 26.4 % — ABNORMAL LOW (ref 36.0–46.0)
HCT: 26.8 % — ABNORMAL LOW (ref 36.0–46.0)
HCT: 28.2 % — ABNORMAL LOW (ref 36.0–46.0)
Hemoglobin: 8.3 g/dL — ABNORMAL LOW (ref 12.0–15.0)
Hemoglobin: 8.3 g/dL — ABNORMAL LOW (ref 12.0–15.0)
Hemoglobin: 8.7 g/dL — ABNORMAL LOW (ref 12.0–15.0)

## 2020-11-23 LAB — GLUCOSE, CAPILLARY
Glucose-Capillary: 125 mg/dL — ABNORMAL HIGH (ref 70–99)
Glucose-Capillary: 141 mg/dL — ABNORMAL HIGH (ref 70–99)

## 2020-11-23 LAB — BASIC METABOLIC PANEL
Anion gap: 8 (ref 5–15)
BUN: <5 mg/dL — ABNORMAL LOW (ref 6–20)
CO2: 24 mmol/L (ref 22–32)
Calcium: 8.4 mg/dL — ABNORMAL LOW (ref 8.9–10.3)
Chloride: 104 mmol/L (ref 98–111)
Creatinine, Ser: 0.76 mg/dL (ref 0.44–1.00)
GFR, Estimated: >60 mL/min (ref >60)
Glucose, Bld: 135 mg/dL — ABNORMAL HIGH (ref 70–99)
Potassium: 3.3 mmol/L — ABNORMAL LOW (ref 3.5–5.1)
Sodium: 136 mmol/L (ref 135–145)

## 2020-11-23 LAB — MAGNESIUM: Magnesium: 1.6 mg/dL — ABNORMAL LOW (ref 1.7–2.4)

## 2020-11-23 MED ORDER — IOHEXOL 350 MG/ML SOLN
100.0000 mL | Freq: Once | INTRAVENOUS | Status: AC | PRN
  Administered 2020-11-23: 100 mL via INTRAVENOUS

## 2020-11-23 MED ORDER — PANTOPRAZOLE SODIUM 40 MG PO TBEC: 40.0000 mg | DELAYED_RELEASE_TABLET | Freq: Every day | ORAL | 3 refills | Status: DC

## 2020-11-23 MED ORDER — DICYCLOMINE HCL 20 MG PO TABS: 20.0000 mg | ORAL_TABLET | Freq: Two times a day (BID) | ORAL | 0 refills | Status: DC | PRN

## 2020-11-23 NOTE — Discharge Summary (Signed)
Physician Discharge Summary   JUPITER BOYS WIO:[id number] DOB: [birth year] DOA: 11/19/2020  PCP: [name], DO  Admit date: 11/19/2020 Discharge date: 11/23/2020  Admitted From: home Disposition:  home Discharging physician: Dwyane Dee, MD  Recommendations for Outpatient Follow-up:  1. Follow up with GI 2. May need follow up with surgery if recurrent bleeding to still discuss subtotal colectomy   Patient discharged to home in Discharge Condition: stable CODE STATUS: Full Diet recommendation:  Diet Orders (From admission, onward)    Start     Ordered   11/23/20 1048  DIET SOFT Room service appropriate? Yes; Fluid consistency: Thin  Diet effective now       Question Answer Comment  Room service appropriate? Yes   Fluid consistency: Thin      11/23/20 1047   11/23/20 0000  Diet Carb Modified        11/23/20 1517          Hospital Course: Ms. Seldon is a 60 yo female with PMH diverticulosis, Cushing syndrome (s/p open adrenalectomy), type 2 diabetes, hyperlipidemia, hypertension, PCOD who presented to the hospital with recurrent bright red blood per rectum.  This has been a similar problem in the past associated with diverticulosis.  She has been recommended to have outpatient evaluation with general surgery to discuss elective subtotal colectomy, however she has been against this due to difficulty locating source of her bleeding. She was admitted for further hemoglobin monitoring and possible need for inpatient surgical evaluation vs bleeding scan if able to locate source.   Hgb had been stable until 11/21/2020 when she started having recurrent multiple bloody bowel movements. General surgery was consulted in case of need for emergent intervention and she was scheduled for undergoing repeat colonoscopy with GI on 11/22/2020. She required 2 units PRBC during hospitalization for hemoglobin that hit a nadir of 6.8 g/dL which was down from her admission hemoglobin of 12.6  g/dL. Despite her multiple bloody bowel movements during hospitalization, colonoscopy was unrevealing and did not show any obvious source of her bleed. Etiology was still considered intermittent diverticular bleeding. General surgery was consulted during hospitalization but given no emergent indication for surgery, she was recommended for outpatient follow-up for any further discussion of elective surgery especially given inability to locate her ongoing bleeding source with associated severe anemia. She was monitored for approximately another 24 hours after her colonoscopy and had no further rectal bleeding and hemoglobin remained stable after her blood transfusions. Hemoglobin was 8.7 g/dL at time of discharge.   * GI bleed -Patient reports bright red blood per rectum prior to admission.  She states this has been a recurrent problem for approx 3 months (bleeding almost monthly).  Source appears to be underlying diverticulosis.  GI has recommended patient to consider surgical evaluation for possible elective subtotal colectomy. Have not been able to locate specific area of her bleeding as of yet - continue trending hemoglobin -Transfuse for hemoglobin less than 7 g/dL -had been relatively stable until evening of 1/5 when she had ~11 bloody bowel movements  - Hgb down to 6.8 g/dL on 1/7; 2 units PRBC ordered -Seen by general surgery; no indication for emergent surgery at this time and patient also still declining any elective surgery -Colonoscopy performed on 11/22/2020. No evidence or obvious bleeding seen. Etiology still considered to be bleeding diverticula -Outpatient follow-up with GI and possible surgical referral if she wishes for future elective surgery; currently she does not  GERD (gastroesophageal reflux disease) -Patient endorsing worsening  indigestion and reflux -Discharged with Protonix  Hyperlipidemia with target LDL less than 100 -Continue statin  Essential hypertension -Coreg and  Benicar resumed at discharge  Diabetes mellitus ([place]) -Continue Lantus, SSI, and CBG monitoring    The patient's chronic medical conditions were treated accordingly per the patient's home medication regimen except as noted.  On day of discharge, patient was felt deemed stable for discharge. Patient/family member advised to call PCP or come back to ER if needed.   Principal Diagnosis: GI bleed  Discharge Diagnoses: Active Hospital Problems   Diagnosis Date Noted  . GI bleed 10/02/2019    Priority: High  . Benign neoplasm of appendix   . GERD (gastroesophageal reflux disease) 11/21/2020  . GIB (gastrointestinal bleeding) 09/23/2020  . Essential hypertension 09/12/2012  . Hyperlipidemia with target LDL less than 100 09/12/2012  . Diabetes mellitus ([place]) 09/12/2012    Resolved Hospital Problems  No resolved problems to display.    Discharge Instructions    Diet Carb Modified   Complete by: As directed    Increase activity slowly   Complete by: As directed      Allergies as of 11/23/2020      Reactions   Norvasc [amlodipine Besylate] Swelling   ANGIOEDEMA   Nsaids Other (See Comments)   GI upset/ GI bleed   Sulfa Antibiotics Other (See Comments)   childhood      Medication List    STOP taking these medications   ferrous sulfate 325 (65 FE) MG EC tablet     TAKE these medications   acetaminophen 325 MG tablet Commonly known as: TYLENOL Take 2 tablets (650 mg total) by mouth every 6 (six) hours as needed for mild pain (or Fever >/= 101). What changed: when to take this   atorvastatin 10 MG tablet Commonly known as: LIPITOR Take 10 mg by mouth daily.   Basaglar KwikPen 100 UNIT/ML Inject 45 Units into the skin at bedtime.   calcium citrate-vitamin D 315-200 MG-UNIT tablet Commonly known as: CITRACAL+D Take 1 tablet by mouth daily.   carvedilol 25 MG tablet Commonly known as: COREG Take 25 mg by mouth 2 (two) times daily.   dicyclomine 20 MG  tablet Commonly known as: BENTYL Take 1 tablet (20 mg total) by mouth 2 (two) times daily as needed for spasms. What changed:   when to take this  reasons to take this   glimepiride 2 MG tablet Commonly known as: AMARYL Take 2 mg by mouth daily.   Jardiance 10 MG Tabs tablet Generic drug: empagliflozin Take 10 mg by mouth daily.   metFORMIN 1000 MG tablet Commonly known as: GLUCOPHAGE Take 500 mg by mouth 2 (two) times daily with a meal.   multivitamin with minerals tablet Take 1 tablet by mouth daily.   olmesartan 40 MG tablet Commonly known as: BENICAR Take 40 mg by mouth daily.   pantoprazole 40 MG tablet Commonly known as: Protonix Take 1 tablet (40 mg total) by mouth daily.   Potassium Chloride ER 20 MEQ Tbcr Take 20 mEq by mouth daily.       Follow-up Information    [facility] Follow [facility].   Specialty: General Surgery Why: Call and schedule a follow up appointment to speak with a colorectal surgeon if you are interested in colon resection for recurrent diverticular bleeding.  Contact information: [address] [place] [address] [phone]              Allergies  Allergen Reactions  . Norvasc [Amlodipine Besylate] Swelling    ANGIOEDEMA  . Nsaids Other (See Comments)    GI upset/ GI bleed  . Sulfa Antibiotics Other (See Comments)    childhood    Consultations: GI Surgery  Discharge Exam: BP (!) 142/96 (BP Location: Right Arm)   Pulse 96   Temp 98.6 F (37 C) (Oral)   Resp 15   Ht 5\' 2"  (1.575 m)   Wt 111 kg   SpO2 100%   BMI 44.76 kg/m  General appearance: alert, cooperative and no distress Head: Normocephalic, without obvious abnormality, atraumatic Eyes: EOMI Lungs: clear to auscultation bilaterally Heart: regular rate and rhythm and S1, S2 normal Abdomen: obese, soft, TTP nonspecifically throughout; BS present Extremities: No lower extremity edema Skin: mobility and turgor normal Neurologic:  Grossly normal  The results of significant diagnostics from this hospitalization (including imaging, microbiology, ancillary and laboratory) are listed below for reference.   Microbiology: Recent Results (from the past 240 hour(s))  Resp Panel by RT-PCR (Flu A&B, Covid) Nasopharyngeal Swab     Status: None   Collection Time: 11/19/20  4:58 PM   Specimen: Nasopharyngeal Swab; Nasopharyngeal(NP) swabs in vial transport medium  Result Value Ref Range Status   SARS Coronavirus 2 by RT PCR NEGATIVE NEGATIVE Final    Comment: (NOTE) SARS-CoV-2 target nucleic acids are NOT DETECTED.  The SARS-CoV-2 RNA is generally detectable in upper respiratory specimens during the acute phase of infection. The lowest concentration of SARS-CoV-2 viral copies this assay can detect is 138 copies/mL. A negative result does not preclude SARS-Cov-2 infection and should not be used as the sole basis for treatment or other patient management decisions. A negative result may occur with  improper specimen collection/handling, submission of specimen other than nasopharyngeal swab, presence of viral mutation(s) within the areas targeted by this assay, and inadequate number of viral copies(<138 copies/mL). A negative result must be combined with clinical observations, patient history, and epidemiological information. The expected result is Negative.  Fact Sheet for Patients:  EntrepreneurPulse.com.au  Fact Sheet for Healthcare Providers:  IncredibleEmployment.be  This test is no t yet approved or cleared by the [place] FDA and  has been authorized for detection and/or diagnosis of SARS-CoV-2 by FDA under an Emergency Use Authorization (EUA). This EUA will remain  in effect (meaning this test can be used) for the duration of the COVID-19 declaration under Section 564(b)(1) of the Act, 21 U.S.C.section 360bbb-3(b)(1), unless the authorization is terminated  or revoked  sooner.       Influenza A by PCR NEGATIVE NEGATIVE Final   Influenza B by PCR NEGATIVE NEGATIVE Final    Comment: (NOTE) The Xpert Xpress SARS-CoV-2/FLU/RSV plus assay is intended as an aid in the diagnosis of influenza from Nasopharyngeal swab specimens and should not be used as a sole basis for treatment. Nasal washings and aspirates are unacceptable for Xpert Xpress SARS-CoV-2/FLU/RSV testing.  Fact Sheet for Patients: EntrepreneurPulse.com.au  Fact Sheet for Healthcare Providers: IncredibleEmployment.be  This test is not yet approved or cleared by the [place] FDA and has been authorized for detection and/or diagnosis of SARS-CoV-2 by FDA under an Emergency Use Authorization (EUA). This EUA will remain in effect (meaning this test can be used) for the duration of the COVID-19 declaration under Section 564(b)(1) of the Act, 21 U.S.C. section 360bbb-3(b)(1), unless the authorization is terminated or revoked.  Performed at [facility], [place] [address] [place], [place] [address]  Labs: BNP (last 3 results) No results for input(s): BNP in the last 8760 hours. Basic Metabolic Panel: Recent Labs  Lab 11/19/20 1314 11/20/20 0747 11/21/20 0612 11/22/20 0038 11/22/20 2357  NA 139 134* 135 136 136  K 4.2 4.1 4.3 4.0 3.3*  CL 103 100 104 106 104  CO2 27 24 23  20* 24  GLUCOSE 156* 152* 200* 192* 135*  BUN 23* 23* 18 13 <5*  CREATININE 1.00 0.91 0.82 0.86 0.76  CALCIUM 9.6 9.0 8.5* 8.6* 8.4*  MG  --   --   --  1.7 1.6*   Liver Function Tests: Recent Labs  Lab 11/19/20 1314 11/20/20 0747  AST 15 17  ALT 16 16  ALKPHOS 53 49  BILITOT 0.7 0.6  PROT 8.6* 7.9  ALBUMIN 4.5 4.1   No results for input(s): LIPASE, AMYLASE in the last 168 hours. No results for input(s): AMMONIA in the last 168 hours. CBC: Recent Labs  Lab 11/19/20 1314 11/19/20 1559 11/20/20 0747 11/21/20 0612 11/21/20 1803  11/22/20 0547 11/22/20 1458 11/22/20 2357 11/23/20 0551 11/23/20 1138  WBC 11.3*  --  9.2 7.8  --   --   --   --   --   --   HGB 12.6   < > 11.0* 9.5*   < > 6.8* 8.6* 8.3* 8.3* 8.7*  HCT 42.0   < > 35.8* 31.8*   < > 22.4* 27.7* 26.4* 26.8* 28.2*  MCV 75.3*  --  73.1* 75.4*  --   --   --   --   --   --   PLT 283  --  253 271  --   --   --   --   --   --    < > = values in this interval not displayed.   Cardiac Enzymes: No results for input(s): CKTOTAL, CKMB, CKMBINDEX, TROPONINI in the last 168 hours. BNP: Invalid input(s): POCBNP CBG: Recent Labs  Lab 11/22/20 1400 11/22/20 1649 11/22/20 2118 11/23/20 0734 11/23/20 1202  GLUCAP 111* 96 144* 125* 141*   D-Dimer No results for input(s): DDIMER in the last 72 hours. Hgb A1c No results for input(s): HGBA1C in the last 72 hours. Lipid Profile No results for input(s): CHOL, HDL, LDLCALC, TRIG, CHOLHDL, LDLDIRECT in the last 72 hours. Thyroid function studies No results for input(s): TSH, T4TOTAL, T3FREE, THYROIDAB in the last 72 hours.  Invalid input(s): FREET3 Anemia work up No results for input(s): VITAMINB12, FOLATE, FERRITIN, TIBC, IRON, RETICCTPCT in the last 72 hours. Urinalysis    Component Value Date/Time   COLORURINE YELLOW 10/25/2020 1109   APPEARANCEUR CLEAR 10/25/2020 1109   LABSPEC 1.018 10/25/2020 1109   PHURINE 5.0 10/25/2020 1109   GLUCOSEU >=500 (A) 10/25/2020 1109   HGBUR NEGATIVE 10/25/2020 1109   [place] 10/25/2020 1109   BILIRUBINUR neg 08/25/2014 1157   KETONESUR 20 (A) 10/25/2020 1109   PROTEINUR NEGATIVE 10/25/2020 1109   UROBILINOGEN 0.2 08/25/2014 1157   NITRITE NEGATIVE 10/25/2020 1109   LEUKOCYTESUR NEGATIVE 10/25/2020 1109   Sepsis Labs Invalid input(s): PROCALCITONIN,  WBC,  LACTICIDVEN Microbiology Recent Results (from the past 240 hour(s))  Resp Panel by RT-PCR (Flu A&B, Covid) Nasopharyngeal Swab     Status: None   Collection Time: 11/19/20  4:58 PM   Specimen:  Nasopharyngeal Swab; Nasopharyngeal(NP) swabs in vial transport medium  Result Value Ref Range Status   SARS Coronavirus 2 by RT PCR NEGATIVE NEGATIVE Final    Comment: (NOTE) SARS-CoV-2  target nucleic acids are NOT DETECTED.  The SARS-CoV-2 RNA is generally detectable in upper respiratory specimens during the acute phase of infection. The lowest concentration of SARS-CoV-2 viral copies this assay can detect is 138 copies/mL. A negative result does not preclude SARS-Cov-2 infection and should not be used as the sole basis for treatment or other patient management decisions. A negative result may occur with  improper specimen collection/handling, submission of specimen other than nasopharyngeal swab, presence of viral mutation(s) within the areas targeted by this assay, and inadequate number of viral copies(<138 copies/mL). A negative result must be combined with clinical observations, patient history, and epidemiological information. The expected result is Negative.  Fact Sheet for Patients:  EntrepreneurPulse.com.au  Fact Sheet for Healthcare Providers:  IncredibleEmployment.be  This test is no t yet approved or cleared by the [place] FDA and  has been authorized for detection and/or diagnosis of SARS-CoV-2 by FDA under an Emergency Use Authorization (EUA). This EUA will remain  in effect (meaning this test can be used) for the duration of the COVID-19 declaration under Section 564(b)(1) of the Act, 21 U.S.C.section 360bbb-3(b)(1), unless the authorization is terminated  or revoked sooner.       Influenza A by PCR NEGATIVE NEGATIVE Final   Influenza B by PCR NEGATIVE NEGATIVE Final    Comment: (NOTE) The Xpert Xpress SARS-CoV-2/FLU/RSV plus assay is intended as an aid in the diagnosis of influenza from Nasopharyngeal swab specimens and should not be used as a sole basis for treatment. Nasal washings and aspirates are unacceptable for  Xpert Xpress SARS-CoV-2/FLU/RSV testing.  Fact Sheet for Patients: EntrepreneurPulse.com.au  Fact Sheet for Healthcare Providers: IncredibleEmployment.be  This test is not yet approved or cleared by the [place] FDA and has been authorized for detection and/or diagnosis of SARS-CoV-2 by FDA under an Emergency Use Authorization (EUA). This EUA will remain in effect (meaning this test can be used) for the duration of the COVID-19 declaration under Section 564(b)(1) of the Act, 21 U.S.C. section 360bbb-3(b)(1), unless the authorization is terminated or revoked.  Performed at [facility], [place] [address], [place], [place] [address]     Procedures/Studies: No results found.   Time coordinating discharge: Over 30 minutes    Dwyane Dee, MD  [facility] 11/23/2020, 4:57 PM

## 2020-11-23 NOTE — ED Notes (Signed)
Pt has had two more episodes of rectal bleeding since her labs were drawn.

## 2020-11-23 NOTE — Progress Notes (Signed)
Assessment unchanged. Pt verbalized understanding of dc instructions through teach back. Discharged via wc to front entrance accompanied by NT.

## 2020-11-23 NOTE — ED Triage Notes (Signed)
Pt came in with c/o GI bleed. She was discharged today for same. Her Hgb before discharge was 8.7. She states that before she came in she had an episode of large stool with bright red blood. Pt vitals stable.

## 2020-11-23 NOTE — Progress Notes (Signed)
    Progress Note   Subjective  No bleeding since bowel prep. Loose Cogar bowel movement this morning. She feel weak.    Objective  Vital signs in last 24 hours: Temp:  [98.4 F (36.9 C)-99.2 F (37.3 C)] 98.4 F (36.9 C) (01/08 0559) Pulse Rate:  [87-98] 90 (01/08 0559) Resp:  [18-29] 18 (01/08 0559) BP: (110-162)/(56-98) 110/58 (01/08 0559) SpO2:  [90 %-100 %] 100 % (01/08 0559) Weight:  [818 kg] 111 kg (01/07 1234) Last BM Date: 11/22/20  General: Alert, well-developed, in NAD Heart:  Regular rate and rhythm; no murmurs Chest: Clear to ascultation bilaterally Abdomen:  Soft, nontender and nondistended. Normal bowel sounds, without guarding, and without rebound.   Extremities:  Without edema. Neurologic:  Alert and  oriented x4; grossly normal neurologically. Psych:  Alert and cooperative. Normal mood and affect.  Intake/Output from previous day: 01/07 0701 - 01/08 0700 In: 3880.1 [P.O.:1200; I.V.:1345.1; Blood:1335] Out: -  Intake/Output this shift: Total I/O In: 120 [P.O.:120] Out: -   Lab Results: Recent Labs    11/21/20 0612 11/21/20 1803 11/22/20 1458 11/22/20 2357 11/23/20 0551  WBC 7.8  --   --   --   --   HGB 9.5*   < > 8.6* 8.3* 8.3*  HCT 31.8*   < > 27.7* 26.4* 26.8*  PLT 271  --   --   --   --    < > = values in this interval not displayed.   BMET Recent Labs    11/21/20 0612 11/22/20 0038 11/22/20 2357  NA 135 136 136  K 4.3 4.0 3.3*  CL 104 106 104  CO2 23 20* 24  GLUCOSE 200* 192* 135*  BUN 18 13 <5*  CREATININE 0.82 0.86 0.76  CALCIUM 8.5* 8.6* 8.4*      Assessment & Recommendations   1. Recurrent diverticular bleed has resolved. Pandiverticulosis and 2 polyps on colonoscopy yesterday. No blood in colon and site of bleeding was not identified. Advance diet to soft and if tolerated consider discharge later today or tomorrow. Recent history of several diverticular bleeds. Recommended that she proceeds with an outpatient appt with a  [facility] colorectal surgeon to consider an elective subtotal colectomy. Await polyp pathology report. Advised no strenuous activity for 2-3 weeks. Advised avoidance of ASA/NSAIDs long term. Outpatient GI follow up with Dr. Oretha Caprice. GI signing off.   2. ABL anemia. Hgb stable at 8.3. Follow up with PCP.      LOS: 2 days   Norberto Sorenson T. Fuller Plan MD 11/23/2020, 10:48 AM

## 2020-11-23 NOTE — ED Provider Notes (Signed)
[facility] DEPT Provider Note   CSN: [id number] Arrival date & time: 11/23/20  1942     History Chief Complaint  Patient presents with  . Rectal Bleeding    Carol Mueller is a 60 y.o. female.  HPI 60 year old female presents with recurrent rectal bleeding.  She was discharged from the hospital this morning.  Yesterday she had a colonoscopy.  Chart review seems to indicate that this is presumably from diverticular bleeding.  However despite having no bleeding over the last 24 hours pretty much as soon as she got home she started bleeding again.  She has had 3 total episodes in the last couple hours.  Feels generally weak though that is a similar feeling to when she was admitted.  No significant abdominal pain.   Past Medical History:  Diagnosis Date  . Anemia   . 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 Diabetes mellitus without complication (HCC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  . Diverticulitis   . Diverticulosis   . GI bleed   . Hyperlipidemia   . Hypertension   . Polycystic ovary disease   . Sepsis ([place]) 05/2019    Patient Active Problem List   Diagnosis Date Noted  . Benign neoplasm of appendix   . GERD (gastroesophageal reflux disease) 11/21/2020  . SIRS (systemic inflammatory response syndrome) ([place]) 10/23/2020  . Acute GI bleeding 10/23/2020  . GIB (gastrointestinal bleeding) 09/23/2020  . Hematochezia 10/03/2019  . Benign neoplasm of transverse colon   . Benign neoplasm of descending colon   . GI bleed 10/02/2019  . Rectal bleeding 09/18/2019  . Fever   . Salmonella enteritis 05/28/2019  . 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 Sepsis ([place]) 05/24/2019  . Hypokalemia 06/02/2018  . Lower GI bleeding 06/02/2018  . Insulin-requiring or dependent type II diabetes mellitus ([place])   . Back pain with left-sided radiculopathy 09/29/2017  . Diarrhea 12/16/2016  . Lower GI bleed 11/30/2016  . History of GI diverticular  bleed   . Diverticulosis of colon with hemorrhage 06/05/2016  . Ischemic colitis ([place]) 08/07/2015  . Abdominal pain   . Tachycardia 07/15/2015  . Diabetes mellitus ([place]) 09/12/2012  . Polycystic disease, ovaries 09/12/2012  . Class 3 obesity due to excess calories with body mass index (BMI) of 40.0 to 44.9 in adult 09/12/2012  . Essential hypertension 09/12/2012  . Hyperlipidemia with target LDL less than 100 09/12/2012  . 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 ([place]) 09/12/2012  . Allergic rhinitis 04/06/2012    Past Surgical History:  Procedure Laterality Date  . ADRENALECTOMY  1996  . BREAST BIOPSY    . CHOLECYSTECTOMY    . COLONOSCOPY WITH PROPOFOL N/A 10/03/2019   Procedure: COLONOSCOPY WITH PROPOFOL;  Surgeon: Ladene Artist, MD;  Location: [facility]COPY;  Service: Endoscopy;  Laterality: N/A;  . ESOPHAGOGASTRODUODENOSCOPY (EGD) WITH PROPOFOL N/A 07/16/2015   Procedure: ESOPHAGOGASTRODUODENOSCOPY (EGD) WITH PROPOFOL;  Surgeon: [name], MD;  Location: [facility];  Service: Endoscopy;  Laterality: N/A;  . POLYPECTOMY  10/03/2019   Procedure: POLYPECTOMY;  Surgeon: Ladene Artist, MD;  Location: [facility];  Service: Endoscopy;;     OB History   No obstetric history on file.     Family History  Problem Relation Age of Onset  . Hypertension Mother   . Cancer Mother        ?uterine or ovarian cancer  . Hypertension Father   . Diabetes Father   .  Hypertension Sister   . Diabetes Sister   . Hypertension Brother   . Diabetes Brother   . Hypertension Maternal Grandfather   . Diabetes Paternal Grandmother   . Breast cancer Maternal Grandmother   . Colon cancer Neg Hx     Social History   Tobacco Use  . Smoking status: Never Smoker  . Smokeless tobacco: Never Used  Vaping Use  . Vaping Use: Never used  Substance Use Topics  . Alcohol use: No  . Drug use: No    Home Medications Prior to Admission medications   Medication Sig Start Date End Date  Taking? Authorizing Provider  acetaminophen (TYLENOL) 325 MG tablet Take 2 tablets (650 mg total) by mouth every 6 (six) hours as needed for mild pain (or Fever >/= 101). Patient taking differently: Take 650 mg by mouth as needed for mild pain (or Fever >/= 101). 09/19/19   [name] I, MD  atorvastatin (LIPITOR) 10 MG tablet Take 10 mg by mouth daily. 03/06/19   [provider]  calcium citrate-vitamin D (CITRACAL+D) 315-200 MG-UNIT tablet Take 1 tablet by mouth daily.    [provider]  carvedilol (COREG) 25 MG tablet Take 25 mg by mouth 2 (two) times daily. 10/30/20   [provider]  dicyclomine (BENTYL) 20 MG tablet Take 1 tablet (20 mg total) by mouth 2 (two) times daily as needed for spasms. 11/23/20   Dwyane Dee, MD  empagliflozin (JARDIANCE) 10 MG TABS tablet Take 10 mg by mouth daily.    [provider]  glimepiride (AMARYL) 2 MG tablet Take 2 mg by mouth daily. 02/23/20   [provider]  Insulin Glargine (BASAGLAR KWIKPEN) 100 UNIT/ML Inject 45 Units into the skin at bedtime.  02/20/20   [provider]  metFORMIN (GLUCOPHAGE) 1000 MG tablet Take 500 mg by mouth 2 (two) times daily with a meal.  01/11/20   [provider]  Multiple Vitamins-Minerals (MULTIVITAMIN WITH MINERALS) tablet Take 1 tablet by mouth daily.    [provider]  olmesartan (BENICAR) 40 MG tablet Take 40 mg by mouth daily. 10/11/19   [provider]  pantoprazole (PROTONIX) 40 MG tablet Take 1 tablet (40 mg total) by mouth daily. 11/23/20 11/23/21  Dwyane Dee, MD  Potassium Chloride ER 20 MEQ TBCR Take 20 mEq by mouth daily. 10/30/20   [provider]    Allergies    Norvasc [amlodipine besylate], Nsaids, and Sulfa antibiotics  Review of Systems   Review of Systems  Constitutional: Positive for fatigue.  Gastrointestinal: Positive for blood in stool. Negative for abdominal pain.  All other systems reviewed and are  negative.   Physical Exam Updated Vital Signs BP 104/66   Pulse 96   Temp 99.4 F (37.4 C) (Oral)   Resp 20   Ht 5\' 2"  (1.575 m)   Wt 110.7 kg   SpO2 99%   BMI 44.63 kg/m   Physical Exam Vitals and nursing note reviewed. Exam conducted with a chaperone present.  Constitutional:      General: She is not in acute distress.    Appearance: She is well-developed and well-nourished.  HENT:     Head: Normocephalic and atraumatic.     Right Ear: External ear normal.     Left Ear: External ear normal.     Nose: Nose normal.  Eyes:     General:        Right eye: No discharge.  Left eye: No discharge.  Cardiovascular:     Rate and Rhythm: Normal rate and regular rhythm.     Heart sounds: Normal heart sounds.  Pulmonary:     Effort: Pulmonary effort is normal.     Breath sounds: Normal breath sounds.  Abdominal:     Palpations: Abdomen is soft.     Tenderness: There is no abdominal tenderness.  Genitourinary:    Comments: DRE shows small amount of dark red blood. No obvious hemorrhoids/masses. Skin:    General: Skin is warm and dry.  Neurological:     Mental Status: She is alert.  Psychiatric:        Mood and Affect: Mood is not anxious.     ED Results / Procedures / Treatments   Labs (all labs ordered are listed, but only abnormal results are displayed) Labs Reviewed  CBC - Abnormal; Notable for the following components:      Result Value   WBC 11.4 (*)    RBC 3.50 (*)    Hemoglobin 8.4 (*)    HCT 27.3 (*)    MCV 78.0 (*)    MCH 24.0 (*)    RDW 18.2 (*)    nRBC 0.3 (*)    All other components within normal limits  TYPE AND SCREEN    EKG None  Radiology No results found.  Procedures Procedures (including critical care time)  Medications Ordered in ED Medications  iohexol (OMNIPAQUE) 350 MG/ML injection 100 mL (100 mLs Intravenous Contrast Given 11/23/20 2312)    ED Course  I have reviewed the triage vital signs and the nursing  notes.  Pertinent labs & imaging results that were available during my care of the patient were reviewed by me and considered in my medical decision making (see chart for details).    MDM Rules/Calculators/A&P                          Chart has been reviewed.  Hemoglobin slightly decreased from earlier in the day.  Patient otherwise is well-appearing.  I discussed with GI, Dr. Hilarie Fredrickson, who recommends CT angiography of abdomen and pelvis to see if there is an acute bleeding source right now.  If so will need IR management.  Otherwise she probably needs to be readmitted and GI will consult in the morning.  Care transferred to Dr. Dayna Barker Final Clinical Impression(s) / ED Diagnoses Final diagnoses:  Rectal bleeding    Rx / DC Orders ED Discharge Orders    None       Sherwood Gambler, MD 11/23/20 2320

## 2020-11-24 ENCOUNTER — Encounter (HOSPITAL_COMMUNITY): Payer: Self-pay | Admitting: Family Medicine

## 2020-11-24 DIAGNOSIS — Z7984 Long term (current) use of oral hypoglycemic drugs: Secondary | ICD-10-CM | POA: Diagnosis not present

## 2020-11-24 DIAGNOSIS — E119 Type 2 diabetes mellitus without complications: Secondary | ICD-10-CM | POA: Diagnosis present

## 2020-11-24 DIAGNOSIS — E876 Hypokalemia: Secondary | ICD-10-CM | POA: Diagnosis present

## 2020-11-24 DIAGNOSIS — K5731 Diverticulosis of large intestine without perforation or abscess with bleeding: Secondary | ICD-10-CM | POA: Diagnosis present

## 2020-11-24 DIAGNOSIS — E785 Hyperlipidemia, unspecified: Secondary | ICD-10-CM | POA: Diagnosis present

## 2020-11-24 DIAGNOSIS — D62 Acute posthemorrhagic anemia: Secondary | ICD-10-CM | POA: Diagnosis present

## 2020-11-24 DIAGNOSIS — D500 Iron deficiency anemia secondary to blood loss (chronic): Secondary | ICD-10-CM | POA: Diagnosis not present

## 2020-11-24 DIAGNOSIS — K921 Melena: Secondary | ICD-10-CM | POA: Diagnosis not present

## 2020-11-24 DIAGNOSIS — E1169 Type 2 diabetes mellitus with other specified complication: Secondary | ICD-10-CM | POA: Diagnosis not present

## 2020-11-24 DIAGNOSIS — D509 Iron deficiency anemia, unspecified: Secondary | ICD-10-CM

## 2020-11-24 DIAGNOSIS — Z6841 Body Mass Index (BMI) 40.0 and over, adult: Secondary | ICD-10-CM | POA: Diagnosis not present

## 2020-11-24 DIAGNOSIS — Z79899 Other long term (current) drug therapy: Secondary | ICD-10-CM | POA: Diagnosis not present

## 2020-11-24 DIAGNOSIS — K625 Hemorrhage of anus and rectum: Secondary | ICD-10-CM | POA: Diagnosis not present

## 2020-11-24 DIAGNOSIS — Z9049 Acquired absence of other specified parts of digestive tract: Secondary | ICD-10-CM | POA: Diagnosis not present

## 2020-11-24 DIAGNOSIS — K317 Polyp of stomach and duodenum: Secondary | ICD-10-CM | POA: Diagnosis present

## 2020-11-24 DIAGNOSIS — Z20822 Contact with and (suspected) exposure to covid-19: Secondary | ICD-10-CM | POA: Diagnosis present

## 2020-11-24 DIAGNOSIS — I10 Essential (primary) hypertension: Secondary | ICD-10-CM | POA: Diagnosis present

## 2020-11-24 DIAGNOSIS — D649 Anemia, unspecified: Secondary | ICD-10-CM

## 2020-11-24 DIAGNOSIS — Q398 Other congenital malformations of esophagus: Secondary | ICD-10-CM | POA: Diagnosis not present

## 2020-11-24 DIAGNOSIS — Z794 Long term (current) use of insulin: Secondary | ICD-10-CM | POA: Diagnosis not present

## 2020-11-24 LAB — HEMOGLOBIN AND HEMATOCRIT, BLOOD
HCT: 24.5 % — ABNORMAL LOW (ref 36.0–46.0)
HCT: 24.7 % — ABNORMAL LOW (ref 36.0–46.0)
HCT: 22.7 % — ABNORMAL LOW (ref 36.0–46.0)
Hemoglobin: 7.6 g/dL — ABNORMAL LOW (ref 12.0–15.0)
Hemoglobin: 7.5 g/dL — ABNORMAL LOW (ref 12.0–15.0)
Hemoglobin: 6.9 g/dL — CL (ref 12.0–15.0)

## 2020-11-24 LAB — CBC
HCT: 24.1 % — ABNORMAL LOW (ref 36.0–46.0)
Hemoglobin: 7.3 g/dL — ABNORMAL LOW (ref 12.0–15.0)
MCH: 23.6 pg — ABNORMAL LOW (ref 26.0–34.0)
MCHC: 30.3 g/dL (ref 30.0–36.0)
MCV: 78 fL — ABNORMAL LOW (ref 80.0–100.0)
Platelets: 235 10*3/uL (ref 150–400)
RBC: 3.09 MIL/uL — ABNORMAL LOW (ref 3.87–5.11)
RDW: 18.6 % — ABNORMAL HIGH (ref 11.5–15.5)
WBC: 10.1 10*3/uL (ref 4.0–10.5)
nRBC: 0 % (ref 0.0–0.2)

## 2020-11-24 LAB — PREPARE RBC (CROSSMATCH)

## 2020-11-24 LAB — BASIC METABOLIC PANEL
Anion gap: 7 (ref 5–15)
BUN: 9 mg/dL (ref 6–20)
CO2: 26 mmol/L (ref 22–32)
Calcium: 8.3 mg/dL — ABNORMAL LOW (ref 8.9–10.3)
Chloride: 107 mmol/L (ref 98–111)
Creatinine, Ser: 0.86 mg/dL (ref 0.44–1.00)
GFR, Estimated: >60 mL/min (ref >60)
Glucose, Bld: 217 mg/dL — ABNORMAL HIGH (ref 70–99)
Potassium: 4 mmol/L (ref 3.5–5.1)
Sodium: 140 mmol/L (ref 135–145)

## 2020-11-24 LAB — HEMOGLOBIN A1C
Hgb A1c MFr Bld: 6.5 % — ABNORMAL HIGH (ref 4.8–5.6)
Mean Plasma Glucose: 139.85 mg/dL

## 2020-11-24 LAB — GLUCOSE, CAPILLARY
Glucose-Capillary: 137 mg/dL — ABNORMAL HIGH (ref 70–99)
Glucose-Capillary: 113 mg/dL — ABNORMAL HIGH (ref 70–99)
Glucose-Capillary: 140 mg/dL — ABNORMAL HIGH (ref 70–99)

## 2020-11-24 LAB — CBG MONITORING, ED: Glucose-Capillary: 183 mg/dL — ABNORMAL HIGH (ref 70–99)

## 2020-11-24 LAB — RESP PANEL BY RT-PCR (FLU A&B, COVID) ARPGX2
Influenza A by PCR: NEGATIVE
Influenza B by PCR: NEGATIVE
SARS Coronavirus 2 by RT PCR: NEGATIVE

## 2020-11-24 MED ORDER — PANTOPRAZOLE SODIUM 40 MG PO TBEC
40.0000 mg | DELAYED_RELEASE_TABLET | Freq: Every day | ORAL | Status: DC
  Administered 2020-11-24 – 2020-11-29 (×6): 40 mg via ORAL
  Filled 2020-11-24 (×6): qty 1

## 2020-11-24 MED ORDER — ACETAMINOPHEN 650 MG RE SUPP: 650.0000 mg | Freq: Four times a day (QID) | RECTAL | Status: DC | PRN

## 2020-11-24 MED ORDER — SODIUM CHLORIDE 0.9% IV SOLUTION: Freq: Once | INTRAVENOUS | Status: AC

## 2020-11-24 MED ORDER — METFORMIN HCL 500 MG PO TABS: 500.0000 mg | ORAL_TABLET | Freq: Two times a day (BID) | ORAL | Status: DC

## 2020-11-24 MED ORDER — INSULIN ASPART 100 UNIT/ML ~~LOC~~ SOLN
0.0000 [IU] | Freq: Three times a day (TID) | SUBCUTANEOUS | Status: DC
  Administered 2020-11-24: 2 [IU] via SUBCUTANEOUS
  Administered 2020-11-24 – 2020-11-25 (×2): 3 [IU] via SUBCUTANEOUS
  Administered 2020-11-26 – 2020-11-27 (×2): 2 [IU] via SUBCUTANEOUS
  Administered 2020-11-27: 3 [IU] via SUBCUTANEOUS
  Administered 2020-11-27: 2 [IU] via SUBCUTANEOUS
  Filled 2020-11-24: qty 0.15

## 2020-11-24 MED ORDER — ATORVASTATIN CALCIUM 10 MG PO TABS
10.0000 mg | ORAL_TABLET | Freq: Every day | ORAL | Status: DC
  Administered 2020-11-24 – 2020-11-29 (×6): 10 mg via ORAL
  Filled 2020-11-24 (×6): qty 1

## 2020-11-24 MED ORDER — IRBESARTAN 150 MG PO TABS
300.0000 mg | ORAL_TABLET | Freq: Every day | ORAL | Status: DC
  Administered 2020-11-24 – 2020-11-29 (×6): 300 mg via ORAL
  Filled 2020-11-24 (×6): qty 2
  Filled 2020-11-24: qty 1

## 2020-11-24 MED ORDER — POTASSIUM CHLORIDE CRYS ER 20 MEQ PO TBCR
20.0000 meq | EXTENDED_RELEASE_TABLET | Freq: Every day | ORAL | Status: DC
  Administered 2020-11-24 – 2020-11-27 (×4): 20 meq via ORAL
  Filled 2020-11-24 (×4): qty 1

## 2020-11-24 MED ORDER — INSULIN ASPART 100 UNIT/ML ~~LOC~~ SOLN
0.0000 [IU] | Freq: Every day | SUBCUTANEOUS | Status: DC
  Filled 2020-11-24: qty 0.05

## 2020-11-24 MED ORDER — INSULIN GLARGINE 100 UNIT/ML ~~LOC~~ SOLN
45.0000 [IU] | Freq: Every day | SUBCUTANEOUS | Status: DC
  Administered 2020-11-24 – 2020-11-28 (×6): 45 [IU] via SUBCUTANEOUS
  Filled 2020-11-24 (×6): qty 0.45

## 2020-11-24 MED ORDER — LACTATED RINGERS IV SOLN: INTRAVENOUS | Status: DC

## 2020-11-24 MED ORDER — CARVEDILOL 25 MG PO TABS
25.0000 mg | ORAL_TABLET | Freq: Two times a day (BID) | ORAL | Status: DC
  Administered 2020-11-24 – 2020-11-29 (×10): 25 mg via ORAL
  Filled 2020-11-24 (×10): qty 1
  Filled 2020-11-24: qty 2
  Filled 2020-11-24: qty 1

## 2020-11-24 MED ORDER — ACETAMINOPHEN 325 MG PO TABS
650.0000 mg | ORAL_TABLET | Freq: Four times a day (QID) | ORAL | Status: DC | PRN
  Administered 2020-11-24 – 2020-11-27 (×4): 650 mg via ORAL
  Filled 2020-11-24 (×5): qty 2

## 2020-11-24 MED ORDER — MAGNESIUM SULFATE 2 GM/50ML IV SOLN
2.0000 g | Freq: Once | INTRAVENOUS | Status: AC
  Administered 2020-11-24: 2 g via INTRAVENOUS
  Filled 2020-11-24: qty 50

## 2020-11-24 MED ORDER — EMPAGLIFLOZIN 10 MG PO TABS
10.0000 mg | ORAL_TABLET | Freq: Every day | ORAL | Status: DC
  Filled 2020-11-24 (×2): qty 1

## 2020-11-24 NOTE — Consult Note (Signed)
Consultation  Referring Provider: Dr. Darrick Meigs     Primary Care Physician:  [name], DO Primary Gastroenterologist: Dr. [name]       Reason for Consultation:   GI bleed         HPI:   Carol Mueller is a 60 y.o. female with a past medical history of recurrent diverticular bleeding, pandiverticulosis, Cushing's disease status post unilateral adrenalectomy, polycystic disease, status post cholecystectomy and others listed below, who represented to the hospital yesterday 11/23/2020 after only being home for a few hours with continued GI bleeding.    Please recall patient had an admission 12/8-12/08/2020 for diverticular bleed which required a 3-day hospitalization but did not require any transfusions.  She then represented to the hospital on 11/20/2020 for a repeat GI bleed and was sent home 11/23/2020 after having no further bleeds for 48 hours.  She did undergo colonoscopy 11/22/2020 with Dr. Fuller Plan which showed a 9 mm polyp at the appendiceal orifice, 6 mm polyp in the transverse colon, internal hemorrhoids, moderate diverticulosis in the entire examined colon with evidence of an impacted diverticulum but no evidence of diverticular bleeding and there was no blood in the colon.  Hemoglobin stable at 8.3 at time of discharge.  Surgical team did see the patient during that admission but because we are not able to localize bleed as of yet patient would require a total abdominal colectomy, this was nonemergent and so it was recommended patient follow-up with them as wanted for this elective procedure.    Today, the patient tells me that she is only home for a couple of hours, she ate a few bites of food and felt her stomach gurgling while laying on the couch watching TV and got up to have 3 episodes of a large amount of bright red blood per rectum.  She then proceeded back to the ER and had another bowel movement which was smaller yesterday evening around 8 or 9pm.   She has had none since then.  Tells me  that overall she just feels "swimmy headed" and continues with some mild left-sided abdominal discomfort.    Denies fever, chills, weight loss or symptoms that awaken her from sleep.  ER course: Hemoglobin 8.4--> 7.6--> 7.3 overnight; CTA ordered which was negative  Recent GI history (also as above): 11/14/2020 patient seen in clinic by Nicoletta Ba, PA-C: At time of follow-up after recent hospitalization as above she had been doing well.  Apparently surgical consultation was discussed but she did not want to pursue this. 09/2019 colonoscopy: Pandiverticulosis, internal hemorrhoids and 2 polyps removed which were 6-8 mm in size and found to be adenomatous and hyperplastic  Past Medical History:  Diagnosis Date  . Anemia   . 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 Diabetes mellitus without complication (HCC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  . Diverticulitis   . Diverticulosis   . GI bleed   . Hyperlipidemia   . Hypertension   . Polycystic ovary disease   . Sepsis ([place]) 05/2019    Past Surgical History:  Procedure Laterality Date  . ADRENALECTOMY  1996  . BREAST BIOPSY    . CHOLECYSTECTOMY    . COLONOSCOPY WITH PROPOFOL N/A 10/03/2019   Procedure: COLONOSCOPY WITH PROPOFOL;  Surgeon: Ladene Artist, MD;  [facility];  Service: Endoscopy;  Laterality: N/A;  . ESOPHAGOGASTRODUODENOSCOPY (EGD) WITH PROPOFOL N/A 07/16/2015   Procedure: ESOPHAGOGASTRODUODENOSCOPY (EGD) WITH PROPOFOL;  Surgeon: [name],  MD;  Location: [facility];  Service: Endoscopy;  Laterality: N/A;  . POLYPECTOMY  10/03/2019   Procedure: POLYPECTOMY;  Surgeon: Ladene Artist, MD;  Location: [facility];  Service: Endoscopy;;    Family History  Problem Relation Age of Onset  . Hypertension Mother   . Cancer Mother        ?uterine or ovarian cancer  . Hypertension Father   . Diabetes Father   . Hypertension Sister   . Diabetes Sister   . Hypertension Brother   . Diabetes Brother    . Hypertension Maternal Grandfather   . Diabetes Paternal Grandmother   . Breast cancer Maternal Grandmother   . Colon cancer Neg Hx     Social History   Tobacco Use  . Smoking status: Never Smoker  . Smokeless tobacco: Never Used  Vaping Use  . Vaping Use: Never used  Substance Use Topics  . Alcohol use: No  . Drug use: No    Prior to Admission medications   Medication Sig Start Date End Date Taking? Authorizing Provider  acetaminophen (TYLENOL) 325 MG tablet Take 2 tablets (650 mg total) by mouth every 6 (six) hours as needed for mild pain (or Fever >/= 101). Patient taking differently: Take 650 mg by mouth as needed for mild pain (or Fever >/= 101). 09/19/19  Yes [name] I, MD  atorvastatin (LIPITOR) 10 MG tablet Take 10 mg by mouth daily. 03/06/19  Yes [provider]  calcium citrate-vitamin D (CITRACAL+D) 315-200 MG-UNIT tablet Take 1 tablet by mouth daily.   Yes [provider]  carvedilol (COREG) 25 MG tablet Take 25 mg by mouth 2 (two) times daily. 10/30/20  Yes [provider]  dicyclomine (BENTYL) 20 MG tablet Take 1 tablet (20 mg total) by mouth 2 (two) times daily as needed for spasms. 11/23/20  Yes Dwyane Dee, MD  empagliflozin (JARDIANCE) 10 MG TABS tablet Take 10 mg by mouth daily.   Yes [provider]  glimepiride (AMARYL) 2 MG tablet Take 2 mg by mouth daily. 02/23/20  Yes [provider]  Insulin Glargine (BASAGLAR KWIKPEN) 100 UNIT/ML Inject 45 Units into the skin at bedtime.  02/20/20  Yes [provider]  metFORMIN (GLUCOPHAGE) 1000 MG tablet Take 500 mg by mouth 2 (two) times daily with a meal.  01/11/20  Yes [provider]  Multiple Vitamins-Minerals (MULTIVITAMIN WITH MINERALS) tablet Take 1 tablet by mouth daily.   Yes [provider]  olmesartan (BENICAR) 40 MG tablet Take 40 mg by mouth daily. 10/11/19  Yes [provider]  pantoprazole (PROTONIX) 40 MG tablet Take 1  tablet (40 mg total) by mouth daily. 11/23/20 11/23/21 Yes Dwyane Dee, MD  Potassium Chloride ER 20 MEQ TBCR Take 20 mEq by mouth daily. 10/30/20  Yes [provider]    Current Facility-Administered Medications  Medication Dose Route Frequency Provider Last Rate Last Admin  . acetaminophen (TYLENOL) tablet 650 mg  650 mg Oral Q6H PRN Chotiner, Yevonne Aline, MD       Or  . acetaminophen (TYLENOL) suppository 650 mg  650 mg Rectal Q6H PRN Chotiner, Yevonne Aline, MD      . atorvastatin (LIPITOR) tablet 10 mg  10 mg Oral Daily Chotiner, Yevonne Aline, MD   10 mg at 11/24/20 1046  . carvedilol (COREG) tablet 25 mg  25 mg Oral BID Chotiner, Yevonne Aline, MD   25 mg at 11/24/20 1046  . empagliflozin (JARDIANCE) tablet 10 mg  10 mg  Oral Daily Chotiner, Yevonne Aline, MD      . insulin aspart (novoLOG) injection 0-15 Units  0-15 Units Subcutaneous TID WC Chotiner, Yevonne Aline, MD   2 Units at 11/24/20 1246  . insulin aspart (novoLOG) injection 0-5 Units  0-5 Units Subcutaneous QHS Chotiner, Yevonne Aline, MD      . insulin glargine (LANTUS) injection 45 Units  45 Units Subcutaneous QHS Chotiner, Yevonne Aline, MD   45 Units at 11/24/20 0121  . irbesartan (AVAPRO) tablet 300 mg  300 mg Oral Daily Chotiner, Yevonne Aline, MD   300 mg at 11/24/20 1046  . lactated ringers infusion   Intravenous Continuous Chotiner, Yevonne Aline, MD 100 mL/hr at 11/24/20 0811 New Bag at 11/24/20 [id number]  . magnesium sulfate IVPB 2 g 50 mL  2 g Intravenous Once [place], Marge Duncans, MD      . metFORMIN (GLUCOPHAGE) tablet 500 mg  500 mg Oral BID WC Chotiner, Yevonne Aline, MD      . pantoprazole (PROTONIX) EC tablet 40 mg  40 mg Oral Daily Chotiner, Yevonne Aline, MD   40 mg at 11/24/20 1046  . potassium chloride SA (KLOR-CON) CR tablet 20 mEq  20 mEq Oral Daily Chotiner, Yevonne Aline, MD   20 mEq at 11/24/20 1046    Allergies as of 11/23/2020 - Review Complete 11/23/2020  Allergen Reaction Noted  . Norvasc [amlodipine besylate] Swelling 02/22/2012  . Nsaids Other  (See Comments) 08/07/2015  . Sulfa antibiotics Other (See Comments) 02/22/2012     Review of Systems:    Constitutional: No weight loss, fever or chills Skin: No rash  Cardiovascular: No chest pain Respiratory: No SOB  Gastrointestinal: See HPI and otherwise negative Genitourinary: No dysuria Neurological: No headache, dizziness or syncope Musculoskeletal: No new muscle or joint pain Hematologic: No bruising Psychiatric: No history of depression or anxiety    Physical Exam:  Vital signs in last 24 hours: Temp:  [98.6 F (37 C)-99.4 F (37.4 C)] 98.6 F (37 C) (01/09 0807) Pulse Rate:  [78-117] 78 (01/09 1045) Resp:  [15-24] 16 (01/09 0807) BP: (95-153)/(54-96) 138/83 (01/09 1045) SpO2:  [97 %-100 %] 100 % (01/09 1045) Weight:  [110.7 kg] 110.7 kg (01/08 1954) Last BM Date: 11/23/20 General:   Pleasant AA female appears to be in NAD, Well developed, Well nourished, alert and cooperative Head:  Normocephalic and atraumatic. Eyes:   PEERL, EOMI. No icterus. Conjunctiva pink. Ears:  Normal auditory acuity. Neck:  Supple Throat: Oral cavity and pharynx without inflammation, swelling or lesion. Teeth in good condition. Lungs: Respirations even and unlabored. Lungs clear to auscultation bilaterally.   No wheezes, crackles, or rhonchi.  Heart: Normal S1, S2. No MRG. Regular rate and rhythm. No peripheral edema, cyanosis or pallor.  Abdomen:  Soft, nondistended, mild LLQ ttp. No rebound or guarding. Normal bowel sounds. No appreciable masses or hepatomegaly. Rectal:  Not performed.  Msk:  Symmetrical without gross deformities. Peripheral pulses intact.  Extremities:  Without edema, no deformity or joint abnormality.  Neurologic:  Alert and  oriented x4;  grossly normal neurologically. Skin:   Dry and intact without significant lesions or rashes. Psychiatric: Demonstrates good judgement and reason without abnormal affect or behaviors.   LAB RESULTS: Recent Labs    11/23/20 2018  11/24/20 0120 11/24/20 0400 11/24/20 1125  WBC 11.4*  --  10.1  --   HGB 8.4* 7.6* 7.3* 7.5*  HCT 27.3* 24.5* 24.1* 24.7*  PLT 252  --  235  --  BMET Recent Labs    11/22/20 0038 11/22/20 2357 11/24/20 0400  NA 136 136 140  K 4.0 3.3* 4.0  CL 106 104 107  CO2 20* 24 26  GLUCOSE 192* 135* 217*  BUN 13 <5* 9  CREATININE 0.86 0.76 0.86  CALCIUM 8.6* 8.4* 8.3*   STUDIES: CT Angio Abd/Pel W and/or Wo Contrast  Result Date: 11/23/2020 CLINICAL DATA:  GI bleed EXAM: CTA ABDOMEN AND PELVIS WITHOUT AND WITH CONTRAST TECHNIQUE: Multidetector CT imaging of the abdomen and pelvis was performed using the standard protocol during bolus administration of intravenous contrast. Multiplanar reconstructed images and MIPs were obtained and reviewed to evaluate the vascular anatomy. CONTRAST:  100mL OMNIPAQUE IOHEXOL 350 MG/ML SOLN COMPARISON:  CT dated 10/24/2020 FINDINGS: VASCULAR Aorta: Normal caliber aorta without aneurysm, dissection, vasculitis or significant stenosis. Celiac: Patent without evidence of aneurysm, dissection, vasculitis or significant stenosis. SMA: Patent without evidence of aneurysm, dissection, vasculitis or significant stenosis. Renals: Both renal arteries are patent without evidence of aneurysm, dissection, vasculitis, fibromuscular dysplasia or significant stenosis. IMA: Patent without evidence of aneurysm, dissection, vasculitis or significant stenosis. Inflow: Patent without evidence of aneurysm, dissection, vasculitis or significant stenosis. Proximal Outflow: Bilateral common femoral and visualized portions of the superficial and profunda femoral arteries are patent without evidence of aneurysm, dissection, vasculitis or significant stenosis. Veins: No obvious venous abnormality within the limitations of this arterial phase study. Review of the MIP images confirms the above findings. NON-VASCULAR Lower chest: The lung bases are clear. The heart size is normal. Hepatobiliary:  The liver is normal. Status post cholecystectomy.There is no biliary ductal dilation. Pancreas: Normal contours without ductal dilatation. No peripancreatic fluid collection. Spleen: Unremarkable. Adrenals/Urinary Tract: --Adrenal glands: Unremarkable. --Right kidney/ureter: Multiple right renal cysts are noted. --Left kidney/ureter: Multiple left renal cysts are noted. --Urinary bladder: Unremarkable. Stomach/Bowel: --Stomach/Duodenum: There is a linear hyperdense focus within the gastric lumen which may represent an ingested hyperdense pill. --Small bowel: Unremarkable. --Colon: There is scattered colonic diverticula without CT evidence for diverticulitis. --Appendix: Normal. Lymphatic: --No retroperitoneal lymphadenopathy. --No mesenteric lymphadenopathy. --No pelvic or inguinal lymphadenopathy. Reproductive: Multiple calcified fibroids involving the patient's uterus. Other: No ascites or free air. The abdominal wall is normal. Musculoskeletal. No acute displaced fractures. IMPRESSION: 1. No acute findings. 2. Colonic diverticulosis without CT evidence for diverticulitis. 3. Fibroid uterus. Electronically Signed   By: Katherine Mantlehristopher  Green M.D.   On: 11/23/2020 23:39    Impression / Plan:   Impression: 1.  Recurrent diverticular bleed: Recent colonoscopy 11/22/2020 with pandiverticulosis and 2 polyps which were removed, patient had no further bleeding after that and was advanced to a soft diet and sent home, she then returned after about 2 hours with further bloody bowel movements, hemoglobin as below 2.  Acute blood loss anemia: Hemoglobin 8.4 at time of discharge--> 7.5 at time of representation, last bloody bowel movement 8 or 9 PM yesterday  Plan: 1.  CTA reviewed with patient from overnight, there was no acute findings. 2.  Again plan is for observation with transfusion when hemoglobin is less than 7, recommend serial hemoglobins every 6 hours. 3.  Continue supportive measures 4.  Elective total  colectomy was discussed with the patient at time of her last admission, this is not felt necessary and patient did not want to pursue.  Pending course may need to reevaluate. 5.  Again patient has quickened bleeding would recommend a repeat stat CTA to try and localize bleeding. 6.  Please await further recommendations from Dr.  Pearl later today.  Thank you for your kind consultation, we will continue to follow.  Lavone Nian Jadalyn Oliveri  11/24/2020, 12:51 PM

## 2020-11-24 NOTE — Progress Notes (Signed)
Subjective: Patient admitted this morning, see detailed H&P by Dr Tonie Griffith 60 year old female with a history of diabetes mellitus type 2, hypertension, Cushing syndrome, diverticulosis, hyperlipidemia came in for evaluation of recurrent rectal bleeding.  She was discharged from the hospital on 11/23/2020 and return on the evening of 11/23/2020 due to having recurrent bleed from the rectum after returning home.  She had colonoscopy on 11/22/2020 that did not show diverticular bleed or hemorrhaging.  Bleeding was presumably from diverticular bleeding.  CT angiography of the abdomen pelvis was done to see if there was acute bleed however it was negative.  Vitals:   11/24/20 0807 11/24/20 1045  BP: 130/74 138/83  Pulse: 84 78  Resp: 16   Temp: 98.6 F (37 C)   SpO2: 100% 100%      A/P Recurrent diverticular bleed Acute blood loss anemia Hypertension Diabetes mellitus type 2 Hypokalemia  Recurrent diverticular bleed-patient had colonoscopy 2 days ago which was negative, CTA abdomen pelvis also did not show source of bleed.  General surgery had seen in the past and offered total colectomy however patient refused.  GI to see today and discuss colectomy option.  Acute blood loss anemia-hemoglobin is 7.5 today dropped about 1 g since yesterday.  Follow CBC in a.m., transfuse for hemoglobin less than 7.  Hypomagnesemia-magnesium 1.6, will replace magnesium and check serum magnesium in a.m.  [facility] [phone]

## 2020-11-24 NOTE — H&P (Signed)
History and Physical    Carol Mueller [id number] DOB: [birth year] DOA: 11/23/2020  PCP: [name], DO   Patient coming from:  Home  Chief Complaint: Blood from rectum  HPI: Carol Mueller is a 60 y.o. female with medical history significant for DMT2, HTN, Cushing syndrome, diverticulosis, HLD who presents for evaluation of recurrent rectal bleeding.  She was discharged from the hospital on 11/23/20 and returned in evening of 11/23/20 due to having recurrent blood from rectum after returning home.  She had a colonoscopy on 11/22/20 that did not show any diverticular bleeding or hemorrhaging.  Bleeding was presumably from diverticular bleeding.    Arriving home and eating some potatoes and chicken, she had some abdominal pain and recurrent blood from her rectum 20 minutes after eating.  She has had 3 total episodes in the last couple hours so she returned to the emergency room for evaluation.  Feels generally weak though that is a similar feeling to when she was admitted.  She reports abdominal pain in the left lower region of her abdomen.  She has not had any fevers.  She denies any urinary frequency or dysuria.  During her recent admission she was seen by surgery for possible surgical intervention of her GI bleeding but that did not feel emergent surgical intervention and abdominal colectomy was indicated at the time.  ED Course: Hemoglobin was found to be 8.3 in the emergency room.  When she was discharged hemoglobin was 8.6.  Hospitalist service was asked to evaluate and readmit for further management  Review of Systems:  General: Denies weakness, fever, chills, weight loss, night sweats.  Denies dizziness.  Denies change in appetite HENT: Denies head trauma, denies change in hearing, tinnitus.  Denies nasal bleeding.  Denies sore throat, Denies difficulty swallowing Eyes: Denies blurry vision, pain in eye, drainage.  Denies discoloration of eyes. Neck: Denies pain.  Denies swelling.  Denies  pain with movement. Cardiovascular: Denies chest pain, palpitations.  Denies edema.  Denies orthopnea Respiratory: Denies shortness of breath, cough.  Denies wheezing.  Denies sputum production Gastrointestinal: Reports blood from rectum. Reports LLQ abdominal pain, swelling.  Denies nausea, vomiting, diarrhea.  Denies melena.  Denies hematemesis. Musculoskeletal: Denies limitation of movement.  Denies deformity or swelling.  Denies pain.  Denies arthralgias or myalgias. Genitourinary: Denies pelvic pain.  Denies urinary frequency or hesitancy.  Denies dysuria.  Skin: Denies rash.  Denies petechiae, purpura, ecchymosis. Neurological: Denies headache.  Denies syncope.  Denies seizure activity.  Denies paresthesia.  Denies slurred speech, drooping face.  Psychiatric: Denies depression, anxiety. Denies hallucinations.  Past Medical History:  Diagnosis Date  . Anemia   . Cushing's syndrome ([place])    due to adrenal tumor  . Diabetes mellitus without complication (HCC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  . Diverticulitis   . Diverticulosis   . GI bleed   . Hyperlipidemia   . Hypertension   . Polycystic ovary disease   . Sepsis ([place]) 05/2019    Past Surgical History:  Procedure Laterality Date  . ADRENALECTOMY  1996  . BREAST BIOPSY    . CHOLECYSTECTOMY    . COLONOSCOPY WITH PROPOFOL N/A 10/03/2019   Procedure: COLONOSCOPY WITH PROPOFOL;  Surgeon: Ladene Artist, MD;  Location: [facility];  Service: Endoscopy;  Laterality: N/A;  . ESOPHAGOGASTRODUODENOSCOPY (EGD) WITH PROPOFOL N/A 07/16/2015   Procedure: ESOPHAGOGASTRODUODENOSCOPY (EGD) WITH PROPOFOL;  Surgeon: [name], MD;  Location: [facility];  Service: Endoscopy;  Laterality: N/A;  . POLYPECTOMY  10/03/2019   Procedure: POLYPECTOMY;  Surgeon: Ladene Artist, MD;  Location: [facility];  Service: Endoscopy;;    Social History  reports that she has never smoked. She has never used smokeless tobacco. She reports that she does not  drink alcohol and does not use drugs.  Allergies  Allergen Reactions  . Norvasc [Amlodipine Besylate] Swelling    ANGIOEDEMA  . Nsaids Other (See Comments)    GI upset/ GI bleed  . Sulfa Antibiotics Other (See Comments)    childhood    Family History  Problem Relation Age of Onset  . Hypertension Mother   . Cancer Mother        ?uterine or ovarian cancer  . Hypertension Father   . Diabetes Father   . Hypertension Sister   . Diabetes Sister   . Hypertension Brother   . Diabetes Brother   . Hypertension Maternal Grandfather   . Diabetes Paternal Grandmother   . Breast cancer Maternal Grandmother   . Colon cancer Neg Hx      Prior to Admission medications   Medication Sig Start Date End Date Taking? Authorizing Provider  acetaminophen (TYLENOL) 325 MG tablet Take 2 tablets (650 mg total) by mouth every 6 (six) hours as needed for mild pain (or Fever >/= 101). Patient taking differently: Take 650 mg by mouth as needed for mild pain (or Fever >/= 101). 09/19/19   [name] I, MD  atorvastatin (LIPITOR) 10 MG tablet Take 10 mg by mouth daily. 03/06/19   [provider]  calcium citrate-vitamin D (CITRACAL+D) 315-200 MG-UNIT tablet Take 1 tablet by mouth daily.    [provider]  carvedilol (COREG) 25 MG tablet Take 25 mg by mouth 2 (two) times daily. 10/30/20   [provider]  dicyclomine (BENTYL) 20 MG tablet Take 1 tablet (20 mg total) by mouth 2 (two) times daily as needed for spasms. 11/23/20   Dwyane Dee, MD  empagliflozin (JARDIANCE) 10 MG TABS tablet Take 10 mg by mouth daily.    [provider]  glimepiride (AMARYL) 2 MG tablet Take 2 mg by mouth daily. 02/23/20   [provider]  Insulin Glargine (BASAGLAR KWIKPEN) 100 UNIT/ML Inject 45 Units into the skin at bedtime.  02/20/20   [provider]  metFORMIN (GLUCOPHAGE) 1000 MG tablet Take 500 mg by mouth 2 (two) times daily with a meal.  01/11/20   [provider]  Multiple Vitamins-Minerals (MULTIVITAMIN WITH MINERALS) tablet Take 1 tablet by mouth daily.    [provider]  olmesartan (BENICAR) 40 MG tablet Take 40 mg by mouth daily. 10/11/19   [provider]  pantoprazole (PROTONIX) 40 MG tablet Take 1 tablet (40 mg total) by mouth daily. 11/23/20 11/23/21  Dwyane Dee, MD  Potassium Chloride ER 20 MEQ TBCR Take 20 mEq by mouth daily. 10/30/20   [provider]    Physical Exam: Vitals:   11/23/20 2230 11/23/20 2245 11/23/20 2300 11/24/20 0030  BP: 104/66   110/62  Pulse: 85 88 96 88  Resp: (!) 21 (!) 21 20 20   Temp:      TempSrc:      SpO2: 100% 98% 99% 100%  Weight:      Height:        Constitutional: NAD, calm, comfortable Vitals:   11/23/20 2230 11/23/20 2245 11/23/20 2300 11/24/20 0030  BP: 104/66   110/62  Pulse: 85 88 96 88  Resp: (!) 21 (!) 21 20 20  Temp:      TempSrc:      SpO2: 100% 98% 99% 100%  Weight:      Height:       General: WDWN, Alert and oriented x3.  Eyes: EOMI, PERRL, conjunctivae pale pink.  Sclera nonicteric HENT:  [place]/AT, external ears normal.  Nares patent without epistasis.  Mucous membranes are moist. Neck: Soft, normal range of motion, supple, no masses,Trachea midline Respiratory: clear to auscultation bilaterally, no wheezing, no crackles. Normal respiratory effort. No accessory muscle use.  Cardiovascular: Regular rate and rhythm, no murmurs / rubs / gallops. No extremity edema. Abdomen: Soft, left lower abdominal tenderness to palpation, nondistended, no rebound or guarding. Morbidly obese. No masses palpated. Bowel sounds normoactive Musculoskeletal: FROM. no clubbing / cyanosis.  Normal muscle tone.  Skin: Warm, dry, intact no rashes, lesions, ulcers. No induration Neurologic:  Normal speech.  Sensation intact, Strength 5/5 in all extremities.   Psychiatric: Normal judgment and insight.  Normal mood.    Labs on Admission: I have personally reviewed  following labs and imaging studies  CBC: Recent Labs  Lab 11/19/20 1314 11/19/20 1559 11/20/20 0747 11/21/20 0612 11/21/20 1803 11/22/20 1458 11/22/20 2357 11/23/20 0551 11/23/20 1138 11/23/20 2018  WBC 11.3*  --  9.2 7.8  --   --   --   --   --  11.4*  HGB 12.6   < > 11.0* 9.5*   < > 8.6* 8.3* 8.3* 8.7* 8.4*  HCT 42.0   < > 35.8* 31.8*   < > 27.7* 26.4* 26.8* 28.2* 27.3*  MCV 75.3*  --  73.1* 75.4*  --   --   --   --   --  78.0*  PLT 283  --  253 271  --   --   --   --   --  252   < > = values in this interval not displayed.    Basic Metabolic Panel: Recent Labs  Lab 11/19/20 1314 11/20/20 0747 11/21/20 0612 11/22/20 0038 11/22/20 2357  NA 139 134* 135 136 136  K 4.2 4.1 4.3 4.0 3.3*  CL 103 100 104 106 104  CO2 27 24 23  20* 24  GLUCOSE 156* 152* 200* 192* 135*  BUN 23* 23* 18 13 <5*  CREATININE 1.00 0.91 0.82 0.86 0.76  CALCIUM 9.6 9.0 8.5* 8.6* 8.4*  MG  --   --   --  1.7 1.6*    GFR: Estimated Creatinine Clearance: 87.7 mL/min (by C-G formula based on SCr of 0.76 mg/dL).  Liver Function Tests: Recent Labs  Lab 11/19/20 1314 11/20/20 0747  AST 15 17  ALT 16 16  ALKPHOS 53 49  BILITOT 0.7 0.6  PROT 8.6* 7.9  ALBUMIN 4.5 4.1    Urine analysis:    Component Value Date/Time   COLORURINE YELLOW 10/25/2020 1109   APPEARANCEUR CLEAR 10/25/2020 1109   LABSPEC 1.018 10/25/2020 1109   PHURINE 5.0 10/25/2020 1109   GLUCOSEU >=500 (A) 10/25/2020 1109   HGBUR NEGATIVE 10/25/2020 1109   BILIRUBINUR NEGATIVE 10/25/2020 1109   BILIRUBINUR neg 08/25/2014 1157   KETONESUR 20 (A) 10/25/2020 1109   PROTEINUR NEGATIVE 10/25/2020 1109   UROBILINOGEN 0.2 08/25/2014 1157   NITRITE NEGATIVE 10/25/2020 1109   LEUKOCYTESUR NEGATIVE 10/25/2020 1109    Radiological Exams on Admission: CT Angio Abd/Pel W and/or Wo Contrast  Result Date: 11/23/2020 CLINICAL DATA:  GI bleed EXAM: CTA ABDOMEN AND PELVIS WITHOUT AND WITH CONTRAST TECHNIQUE: Multidetector CT imaging of  the abdomen and pelvis was performed using the standard protocol during bolus administration of intravenous contrast. Multiplanar reconstructed images and MIPs were obtained and reviewed to evaluate the vascular anatomy. CONTRAST:  11mL OMNIPAQUE IOHEXOL 350 MG/ML SOLN COMPARISON:  CT dated 10/24/2020 FINDINGS: VASCULAR Aorta: Normal caliber aorta without aneurysm, dissection, vasculitis or significant stenosis. Celiac: Patent without evidence of aneurysm, dissection, vasculitis or significant stenosis. SMA: Patent without evidence of aneurysm, dissection, vasculitis or significant stenosis. Renals: Both renal arteries are patent without evidence of aneurysm, dissection, vasculitis, fibromuscular dysplasia or significant stenosis. IMA: Patent without evidence of aneurysm, dissection, vasculitis or significant stenosis. Inflow: Patent without evidence of aneurysm, dissection, vasculitis or significant stenosis. Proximal Outflow: Bilateral common femoral and visualized portions of the superficial and profunda femoral arteries are patent without evidence of aneurysm, dissection, vasculitis or significant stenosis. Veins: No obvious venous abnormality within the limitations of this arterial phase study. Review of the MIP images confirms the above findings. NON-VASCULAR Lower chest: The lung bases are clear. The heart size is normal. Hepatobiliary: The liver is normal. Status post cholecystectomy.There is no biliary ductal dilation. Pancreas: Normal contours without ductal dilatation. No peripancreatic fluid collection. Spleen: Unremarkable. Adrenals/Urinary Tract: --Adrenal glands: Unremarkable. --Right kidney/ureter: Multiple right renal cysts are noted. --Left kidney/ureter: Multiple left renal cysts are noted. --Urinary bladder: Unremarkable. Stomach/Bowel: --Stomach/Duodenum: There is a linear hyperdense focus within the gastric lumen which may represent an ingested hyperdense pill. --Small bowel: Unremarkable.  --Colon: There is scattered colonic diverticula without CT evidence for diverticulitis. --Appendix: Normal. Lymphatic: --No retroperitoneal lymphadenopathy. --No mesenteric lymphadenopathy. --No pelvic or inguinal lymphadenopathy. Reproductive: Multiple calcified fibroids involving the patient's uterus. Other: No ascites or free air. The abdominal wall is normal. Musculoskeletal. No acute displaced fractures. IMPRESSION: 1. No acute findings. 2. Colonic diverticulosis without CT evidence for diverticulitis. 3. Fibroid uterus. Electronically Signed   By: Constance Holster M.D.   On: 11/23/2020 23:39     Assessment/Plan Principal Problem:   Diverticulosis of colon with hemorrhage Carol Mueller is admitted to [place] floor.  She was recently evaluated and discharged from the hospital on November 23, 2020 for GI bleeding.  She underwent colonoscopy on January 7 particular or hemorrhoidal bleeding.  He was seen by surgery before discharge for evaluation of possible abdominal colectomy but it was not felt to be indicated at this time.  With patient being rebleeding this might need to be reexplored. Gastroenterology is going to reevaluate patient in the morning.  Hemoglobin at presentation to the emergency room was stable from hemoglobin at time of discharge Check CBC in morning  Active Problems:   Essential hypertension Continue home medications of Coreg ARB therapy which is changed to Avapro from Benicar due to formulary the hospital.  Monitor blood pressure    Insulin-requiring or dependent type II diabetes mellitus  Continue basal insulin.  Continue Jardiance and Metformin.  Blood sugars were monitored with meals and at bedtime.  Sliding scale insulin provide as needed for glycemic control.    Anemia, blood loss Monitor serial hemoglobin hematocrit levels.    Hypokalemia Patient with chronic hypokalemia.  Will be continued on oral potassium for repletion. Recheck electrolytes renal function  morning    DVT prophylaxis: SCDs for DVT prophylaxis.  No anticoagulation with GI bleeding Code Status:   Full code Family Communication:  Diagnosis plan discussed with patient.  She verbalized understanding agrees with plan.  Further recommendations to follow as clinically indicated Disposition Plan:   Patient is from:  Home  Anticipated DC to:  Home  Anticipated DC date:  Anticipate 2 midnight or more stay in the hospital  Anticipated DC barriers: No barriers to discharge identified at this time  Consults: Gastroenterology.  Gastroenterology was consulted by ER physician and will see patient in am Admission status:  Inpatient  Yevonne Aline Lauree Yurick MD [facility]  How to contact the [facility] Attending or Consulting provider [place] or covering provider during after hours [place], for this patient?   1. Check the care team in [facility] and look for a) attending/consulting [facility] provider listed and b) the [facility] team listed 2. Log into [email or web address] and use [place]'s universal password to access. If you do not have the password, please contact the hospital operator. 3. Locate the [facility] provider you are looking for under [facility] and page to a number that you can be directly reached. 4. If you still have difficulty reaching the provider, please page the [facility] (Director on Call) for the Hospitalists listed on amion for assistance.  11/24/2020, 12:40 AM

## 2020-11-24 NOTE — Plan of Care (Signed)

## 2020-11-25 DIAGNOSIS — E1169 Type 2 diabetes mellitus with other specified complication: Secondary | ICD-10-CM

## 2020-11-25 DIAGNOSIS — D500 Iron deficiency anemia secondary to blood loss (chronic): Secondary | ICD-10-CM

## 2020-11-25 DIAGNOSIS — K625 Hemorrhage of anus and rectum: Secondary | ICD-10-CM

## 2020-11-25 DIAGNOSIS — K5731 Diverticulosis of large intestine without perforation or abscess with bleeding: Principal | ICD-10-CM

## 2020-11-25 DIAGNOSIS — Z794 Long term (current) use of insulin: Secondary | ICD-10-CM

## 2020-11-25 LAB — GLUCOSE, CAPILLARY
Glucose-Capillary: 164 mg/dL — ABNORMAL HIGH (ref 70–99)
Glucose-Capillary: 99 mg/dL (ref 70–99)
Glucose-Capillary: 104 mg/dL — ABNORMAL HIGH (ref 70–99)
Glucose-Capillary: 151 mg/dL — ABNORMAL HIGH (ref 70–99)
Glucose-Capillary: 117 mg/dL — ABNORMAL HIGH (ref 70–99)

## 2020-11-25 LAB — BPAM RBC
Blood Product Expiration Date: 202201302359
ISSUE DATE / TIME: 202201092035
Unit Type and Rh: 5100

## 2020-11-25 LAB — CBC
HCT: 25.6 % — ABNORMAL LOW (ref 36.0–46.0)
Hemoglobin: 8.1 g/dL — ABNORMAL LOW (ref 12.0–15.0)
MCH: 24.9 pg — ABNORMAL LOW (ref 26.0–34.0)
MCHC: 31.6 g/dL (ref 30.0–36.0)
MCV: 78.8 fL — ABNORMAL LOW (ref 80.0–100.0)
Platelets: 266 10*3/uL (ref 150–400)
RBC: 3.25 MIL/uL — ABNORMAL LOW (ref 3.87–5.11)
RDW: 17.8 % — ABNORMAL HIGH (ref 11.5–15.5)
WBC: 8.7 10*3/uL (ref 4.0–10.5)
nRBC: 0 % (ref 0.0–0.2)

## 2020-11-25 LAB — HEMOGLOBIN AND HEMATOCRIT, BLOOD
HCT: 28.5 % — ABNORMAL LOW (ref 36.0–46.0)
HCT: 27.5 % — ABNORMAL LOW (ref 36.0–46.0)
HCT: 26.1 % — ABNORMAL LOW (ref 36.0–46.0)
Hemoglobin: 9 g/dL — ABNORMAL LOW (ref 12.0–15.0)
Hemoglobin: 8.6 g/dL — ABNORMAL LOW (ref 12.0–15.0)
Hemoglobin: 8.1 g/dL — ABNORMAL LOW (ref 12.0–15.0)

## 2020-11-25 LAB — TYPE AND SCREEN
ABO/RH(D): O POS
Antibody Screen: NEGATIVE
Unit division: 0

## 2020-11-25 LAB — MAGNESIUM: Magnesium: 1.8 mg/dL (ref 1.7–2.4)

## 2020-11-25 MED ORDER — SODIUM CHLORIDE 0.9 % IV SOLN
510.0000 mg | Freq: Once | INTRAVENOUS | Status: AC
  Administered 2020-11-25: 510 mg via INTRAVENOUS
  Filled 2020-11-25: qty 510

## 2020-11-25 NOTE — Progress Notes (Addendum)
[facility] Progress Note  CC: recurrent lower GI bleed  Subjective: She reports feeling much better today after receiving 1 unit of blood yesterday. Energy level has improved. No BM or blood per the rectum since Saturday 11/23/2020. She is passing gas per the rectum. She remains on a clear liquid diet. "Transient" upper and lower abdominal pain. She questions if she can receive IV iron during her hospital admission.   Objective:   Abdominal/pelvic CT angiogram 11/23/2020: 1. No acute findings. 2. Colonic diverticulosis without CT evidence for diverticulitis. 3. Fibroid uterus.   Vital signs in last 24 hours: Temp:  [98 F (36.7 C)-98.4 F (36.9 C)] 98 F (36.7 C) (01/10 0548) Pulse Rate:  [75-82] 82 (01/10 0548) Resp:  [15-17] 16 (01/10 0548) BP: (109-152)/(61-90) 152/85 (01/10 0548) SpO2:  [95 %-100 %] 98 % (01/10 0548) Last BM Date: 11/23/20   General:   Alert 60 year old female in NAD, sitting up in the chair.  Heart: RRR, no murmur.  Pulm:  Breath sounds clear throughout.  Abdomen: Soft. Mild tenderness above and right of the umbilicus, mild RLQ tenderness without rebound or guarding.  + BS x 4 quads.  Extremities:  Without edema. Neurologic:  Alert and  oriented x4;  grossly normal neurologically. Psych:  Alert and cooperative. Normal mood and affect.  Intake/Output from previous day: 01/09 0701 - 01/10 0700 In: 1444.2 [P.O.:240; I.V.:800; Blood:404.2] Out: 900 [Urine:900] Intake/Output this shift: No intake/output data recorded.  Lab Results: Recent Labs    11/23/20 2018 11/24/20 0120 11/24/20 0400 11/24/20 1125 11/24/20 1624 11/25/20 0333  WBC 11.4*  --  10.1  --   --  8.7  HGB 8.4*   < > 7.3* 7.5* 6.9* 8.1*  HCT 27.3*   < > 24.1* 24.7* 22.7* 25.6*  PLT 252  --  235  --   --  266   < > = values in this interval not displayed.   BMET Recent Labs    11/22/20 2357 11/24/20 0400  NA 136 140  K 3.3* 4.0  CL 104 107  CO2 24 26   GLUCOSE 135* 217*  BUN <5* 9  CREATININE 0.76 0.86  CALCIUM 8.4* 8.3*   LFT No results for input(s): PROT, ALBUMIN, AST, ALT, ALKPHOS, BILITOT, BILIDIR, IBILI in the last 72 hours. PT/INR No results for input(s): LABPROT, INR in the last 72 hours. Hepatitis Panel No results for input(s): HEPBSAG, HCVAB, HEPAIGM, HEPBIGM in the last 72 hours.  CT Angio Abd/Pel W and/or Wo Contrast  Result Date: 11/23/2020 CLINICAL DATA:  GI bleed EXAM: CTA ABDOMEN AND PELVIS WITHOUT AND WITH CONTRAST TECHNIQUE: Multidetector CT imaging of the abdomen and pelvis was performed using the standard protocol during bolus administration of intravenous contrast. Multiplanar reconstructed images and MIPs were obtained and reviewed to evaluate the vascular anatomy. CONTRAST:  115mL OMNIPAQUE IOHEXOL 350 MG/ML SOLN COMPARISON:  CT dated 10/24/2020 FINDINGS: VASCULAR Aorta: Normal caliber aorta without aneurysm, dissection, vasculitis or significant stenosis. Celiac: Patent without evidence of aneurysm, dissection, vasculitis or significant stenosis. SMA: Patent without evidence of aneurysm, dissection, vasculitis or significant stenosis. Renals: Both renal arteries are patent without evidence of aneurysm, dissection, vasculitis, fibromuscular dysplasia or significant stenosis. IMA: Patent without evidence of aneurysm, dissection, vasculitis or significant stenosis. Inflow: Patent without evidence of aneurysm, dissection, vasculitis or significant stenosis. Proximal Outflow: Bilateral common femoral and visualized portions of the superficial and profunda femoral arteries are patent without evidence of aneurysm, dissection, vasculitis or  significant stenosis. Veins: No obvious venous abnormality within the limitations of this arterial phase study. Review of the MIP images confirms the above findings. NON-VASCULAR Lower chest: The lung bases are clear. The heart size is normal. Hepatobiliary: The liver is normal. Status post  cholecystectomy.There is no biliary ductal dilation. Pancreas: Normal contours without ductal dilatation. No peripancreatic fluid collection. Spleen: Unremarkable. Adrenals/Urinary Tract: --Adrenal glands: Unremarkable. --Right kidney/ureter: Multiple right renal cysts are noted. --Left kidney/ureter: Multiple left renal cysts are noted. --Urinary bladder: Unremarkable. Stomach/Bowel: --Stomach/Duodenum: There is a linear hyperdense focus within the gastric lumen which may represent an ingested hyperdense pill. --Small bowel: Unremarkable. --Colon: There is scattered colonic diverticula without CT evidence for diverticulitis. --Appendix: Normal. Lymphatic: --No retroperitoneal lymphadenopathy. --No mesenteric lymphadenopathy. --No pelvic or inguinal lymphadenopathy. Reproductive: Multiple calcified fibroids involving the patient's uterus. Other: No ascites or free air. The abdominal wall is normal. Musculoskeletal. No acute displaced fractures. IMPRESSION: 1. No acute findings. 2. Colonic diverticulosis without CT evidence for diverticulitis. 3. Fibroid uterus. Electronically Signed   By: Constance Holster M.D.   On: 11/23/2020 23:39    Assessment / Plan:  61.  60 year old female a history of mesenteric ischemia 2016, lower GI bleed in 2017 (abd/pelvic CT angiogram 06/05/2016 identified active diverticular bleed in the proximal descending colon just beyond the splenic flexure, angiography by IR did not identify source of active bleeding at that time). She was readmitted to the hospital 11/19/2020 with recurrent diverticular bleed and anemia. S/ P colonoscopy 11/22/2020 with pandiverticulosis and 2 polyps which were removed, patient had no further bleeding after that and was advanced to a soft diet and sent home, then returned to the hospital a few hours later with recurrent lower GI bleeding.  Abdominal/pelvic CT angiogram 11/23/2020 showed evidence of diverticulosis without diverticulitis or active GI bleeding  therefore IR intervention was not done. She is reluctant to undergo a subtotal colectomy.  Hg dropped 7.5 -> 6.9. Transfused 1 unit of PRBCs 1/9. Today Hg 8.1. No hematochezia since 1/08.  No significant abdominal pain.  -Clear liquid diet, consider advancing diet to full liquid if no evidence of active GI bleeding this afternoon  -Continue to monitor H/H closely  -Tagged red blood cell scan or surgery if brisk lower GI bleed recurs -Recommend IV iron  -Continue Protonix 40mg  QD -Await further recommendations per Dr. Bryan Lemma   2. History of Cushing's Syndrome   3. DM II  Principal Problem:   Diverticulosis of colon with hemorrhage Active Problems:   Essential hypertension   Hypokalemia   Insulin-requiring or dependent type II diabetes mellitus ([place])   Anemia, blood loss     LOS: 1 day   Carol Mueller  11/25/2020, 8:52 AM

## 2020-11-25 NOTE — Plan of Care (Signed)
  Problem: Education: Goal: Knowledge of General Education information will improve Description: Including pain rating scale, medication(s)/side effects and non-pharmacologic comfort measures Outcome: Progressing   Problem: Health Behavior/Discharge Planning: Goal: Ability to manage health-related needs will improve Outcome: Progressing   Problem: Clinical Measurements: Goal: Ability to maintain clinical measurements within normal limits will improve Outcome: Progressing   Problem: Activity: Goal: Risk for activity intolerance will decrease Outcome: Progressing   Problem: Elimination: Goal: Will not experience complications related to urinary retention Outcome: Progressing

## 2020-11-25 NOTE — Progress Notes (Signed)
[facility]  PROGRESS NOTE  CAIDENCE DEAS [id number] DOB: [birth year] DOA: 11/23/2020 PCP: [name], DO   Brief HPI:   60 year old female with a history of diabetes mellitus type 2, hypertension, Cushing syndrome, diverticulosis, hyperlipidemia came in for evaluation of recurrent rectal bleeding.  She was discharged from the hospital on 11/23/2020 and return on the evening of 11/23/2020 due to having recurrent bleed from the rectum after returning home.  She had colonoscopy on 11/22/2020 that did not show diverticular bleed or hemorrhaging.  Bleeding was presumably from diverticular bleeding.  CT angiography of the abdomen pelvis was done to see if there was acute bleed however it was negative.    Subjective   Patient seen and examined, denies any bloody bowel movement for past 2 days.  She received 1 unit of PRBC yesterday.  Hemoglobin is 8.1 this morning.  Patient is reluctant to undergo subtotal colectomy as recommended by GI and surgery   Assessment/Plan:     1. Recurrent diverticular bleed-patient had colonoscopy 2 days ago which was negative for bleed.  CTA abdomen pelvis was done during this admission which did not show source of bleed.  General surgery in the past had seen and offered subtotal colectomy however patient refused.  GI has been consulted and are following.  No plan for intervention at this time. 2. Acute blood loss anemia-hemoglobin was 6.9 yesterday, she received 1 unit PRBC.  Hemoglobin this morning is 8.1.  GI recommends IV iron.  We'll give one dose of Feraheme 510 mg IV x1. 3. Hypomagnesemia-magnesium was repleted, repeat magnesium was 1.8 this morning. 4. Diabetes mellitus type 2-continue Lantus 45 units subcu daily, sliding scale insulin with NovoLog.  We'll hold metformin and empagliflozin 5. Hypertension-continue Coreg, irbesartan.  Blood pressure is stable.     COVID-19 Labs  No results for input(s): DDIMER, FERRITIN, LDH, CRP in the last 72  hours.  Lab Results  Component Value Date   SARSCOV2NAA NEGATIVE 11/24/2020   [place] NEGATIVE 11/19/2020   [place] NEGATIVE 10/23/2020   [place] NEGATIVE 09/23/2020     Scheduled medications:   . atorvastatin  10 mg Oral Daily  . carvedilol  25 mg Oral BID  . empagliflozin  10 mg Oral Daily  . insulin aspart  0-15 Units Subcutaneous TID WC  . insulin aspart  0-5 Units Subcutaneous QHS  . insulin glargine  45 Units Subcutaneous QHS  . irbesartan  300 mg Oral Daily  . metFORMIN  500 mg Oral BID WC  . pantoprazole  40 mg Oral Daily  . potassium chloride SA  20 mEq Oral Daily         CBG: Recent Labs  Lab 11/24/20 1223 11/24/20 1700 11/24/20 2040 11/25/20 0803 11/25/20 1247  GLUCAP 137* 113* 140* 99 104*    SpO2: 100 %    CBC: Recent Labs  Lab 11/20/20 0747 11/21/20 0612 11/21/20 1803 11/23/20 2018 11/24/20 0120 11/24/20 0400 11/24/20 1125 11/24/20 1624 11/25/20 0333 11/25/20 1000  WBC 9.2 7.8  --  11.4*  --  10.1  --   --  8.7  --   HGB 11.0* 9.5*   < > 8.4*   < > 7.3* 7.5* 6.9* 8.1* 9.0*  HCT 35.8* 31.8*   < > 27.3*   < > 24.1* 24.7* 22.7* 25.6* 28.5*  MCV 73.1* 75.4*  --  78.0*  --  78.0*  --   --  78.8*  --   PLT 253 271  --  252  --  235  --   --  266  --    < > = values in this interval not displayed.    Basic Metabolic Panel: Recent Labs  Lab 11/20/20 0747 11/21/20 0612 11/22/20 0038 11/22/20 2357 11/24/20 0400 11/25/20 0333  NA 134* 135 136 136 140  --   K 4.1 4.3 4.0 3.3* 4.0  --   CL 100 104 106 104 107  --   CO2 24 23 20* 24 26  --   GLUCOSE 152* 200* 192* 135* 217*  --   BUN 23* 18 13 <5* 9  --   CREATININE 0.91 0.82 0.86 0.76 0.86  --   CALCIUM 9.0 8.5* 8.6* 8.4* 8.3*  --   MG  --   --  1.7 1.6*  --  1.8     Liver Function Tests: Recent Labs  Lab 11/19/20 1314 11/20/20 0747  AST 15 17  ALT 16 16  ALKPHOS 53 49  BILITOT 0.7 0.6  PROT 8.6* 7.9  ALBUMIN 4.5 4.1     Antibiotics: Anti-infectives (From  admission, onward)   None       DVT prophylaxis: SCDs  Code Status: Full code  Family Communication: No family at bedside   Consultants:  Gastroenterology  Procedures:      Objective   Vitals:   11/24/20 2359 11/25/20 0548 11/25/20 1025 11/25/20 1404  BP: 121/63 (!) 152/85 (!) 156/72 (!) 150/67  Pulse:  82 79 75  Resp: 16 16 20 20   Temp: 98.4 F (36.9 C) 98 F (36.7 C) 98.3 F (36.8 C) 98.2 F (36.8 C)  TempSrc: Oral Oral Oral Oral  SpO2: 98% 98% 99% 100%  Weight:      Height:        Intake/Output Summary (Last 24 hours) at 11/25/2020 1446 Last data filed at 11/25/2020 1400 Gross per 24 hour  Intake 2212.89 ml  Output 300 ml  Net 1912.89 ml    01/08 1901 - 01/10 0700 In: 1444.2 [P.O.:240; I.V.:800] Out: 900 [Urine:900]  Filed Weights   11/23/20 1954  Weight: 110.7 kg    Physical Examination:   General-appears in no acute distress Heart-S1-S2, regular, no murmur auscultated Lungs-clear to auscultation bilaterally, no wheezing or crackles auscultated Abdomen-soft, nontender, no organomegaly Extremities-no edema in the lower extremities Neuro-alert, oriented x3, no focal deficit noted  Status is: Inpatient  Dispo: The patient is from: Home              Anticipated d/c is to: Home              Anticipated d/c date is: 11/26/2020              Patient currently not stable for discharge  Barrier to discharge-controlled observation for GI bleed            Data Reviewed:   Recent Results (from the past 240 hour(s))  Resp Panel by RT-PCR (Flu A&B, Covid) Nasopharyngeal Swab     Status: None   Collection Time: 11/19/20  4:58 PM   Specimen: Nasopharyngeal Swab; Nasopharyngeal(NP) swabs in vial transport medium  Result Value Ref Range Status   SARS Coronavirus 2 by RT PCR NEGATIVE NEGATIVE Final    Comment: (NOTE) SARS-CoV-2 target nucleic acids are NOT DETECTED.  The SARS-CoV-2 RNA is generally detectable in upper  respiratory specimens during the acute phase of infection. The lowest concentration of SARS-CoV-2 viral copies this assay can detect is 138 copies/mL. A negative result does not preclude  SARS-Cov-2 infection and should not be used as the sole basis for treatment or other patient management decisions. A negative result may occur with  improper specimen collection/handling, submission of specimen other than nasopharyngeal swab, presence of viral mutation(s) within the areas targeted by this assay, and inadequate number of viral copies(<138 copies/mL). A negative result must be combined with clinical observations, patient history, and epidemiological information. The expected result is Negative.  Fact Sheet for Patients:  EntrepreneurPulse.com.au  Fact Sheet for Healthcare Providers:  IncredibleEmployment.be  This test is no t yet approved or cleared by the [place] FDA and  has been authorized for detection and/or diagnosis of SARS-CoV-2 by FDA under an Emergency Use Authorization (EUA). This EUA will remain  in effect (meaning this test can be used) for the duration of the COVID-19 declaration under Section 564(b)(1) of the Act, 21 U.S.C.section 360bbb-3(b)(1), unless the authorization is terminated  or revoked sooner.       Influenza A by PCR NEGATIVE NEGATIVE Final   Influenza B by PCR NEGATIVE NEGATIVE Final    Comment: (NOTE) The Xpert Xpress SARS-CoV-2/FLU/RSV plus assay is intended as an aid in the diagnosis of influenza from Nasopharyngeal swab specimens and should not be used as a sole basis for treatment. Nasal washings and aspirates are unacceptable for Xpert Xpress SARS-CoV-2/FLU/RSV testing.  Fact Sheet for Patients: EntrepreneurPulse.com.au  Fact Sheet for Healthcare Providers: IncredibleEmployment.be  This test is not yet approved or cleared by the [place] FDA and has been  authorized for detection and/or diagnosis of SARS-CoV-2 by FDA under an Emergency Use Authorization (EUA). This EUA will remain in effect (meaning this test can be used) for the duration of the COVID-19 declaration under Section 564(b)(1) of the Act, 21 U.S.C. section 360bbb-3(b)(1), unless the authorization is terminated or revoked.  Performed at [facility], [place] [address], [place], [place] [address]   Resp Panel by RT-PCR (Flu A&B, Covid) Nasopharyngeal Swab     Status: None   Collection Time: 11/24/20 12:55 AM   Specimen: Nasopharyngeal Swab; Nasopharyngeal(NP) swabs in vial transport medium  Result Value Ref Range Status   SARS Coronavirus 2 by RT PCR NEGATIVE NEGATIVE Final    Comment: (NOTE) SARS-CoV-2 target nucleic acids are NOT DETECTED.  The SARS-CoV-2 RNA is generally detectable in upper respiratory specimens during the acute phase of infection. The lowest concentration of SARS-CoV-2 viral copies this assay can detect is 138 copies/mL. A negative result does not preclude SARS-Cov-2 infection and should not be used as the sole basis for treatment or other patient management decisions. A negative result may occur with  improper specimen collection/handling, submission of specimen other than nasopharyngeal swab, presence of viral mutation(s) within the areas targeted by this assay, and inadequate number of viral copies(<138 copies/mL). A negative result must be combined with clinical observations, patient history, and epidemiological information. The expected result is Negative.  Fact Sheet for Patients:  EntrepreneurPulse.com.au  Fact Sheet for Healthcare Providers:  IncredibleEmployment.be  This test is no t yet approved or cleared by the [place] FDA and  has been authorized for detection and/or diagnosis of SARS-CoV-2 by FDA under an Emergency Use Authorization (EUA). This EUA will remain  in effect  (meaning this test can be used) for the duration of the COVID-19 declaration under Section 564(b)(1) of the Act, 21 U.S.C.section 360bbb-3(b)(1), unless the authorization is terminated  or revoked sooner.       Influenza A by PCR NEGATIVE NEGATIVE Final  Influenza B by PCR NEGATIVE NEGATIVE Final    Comment: (NOTE) The Xpert Xpress SARS-CoV-2/FLU/RSV plus assay is intended as an aid in the diagnosis of influenza from Nasopharyngeal swab specimens and should not be used as a sole basis for treatment. Nasal washings and aspirates are unacceptable for Xpert Xpress SARS-CoV-2/FLU/RSV testing.  Fact Sheet for Patients: EntrepreneurPulse.com.au  Fact Sheet for Healthcare Providers: IncredibleEmployment.be  This test is not yet approved or cleared by the [place] FDA and has been authorized for detection and/or diagnosis of SARS-CoV-2 by FDA under an Emergency Use Authorization (EUA). This EUA will remain in effect (meaning this test can be used) for the duration of the COVID-19 declaration under Section 564(b)(1) of the Act, 21 U.S.C. section 360bbb-3(b)(1), unless the authorization is terminated or revoked.  Performed at [facility], [place] [address] [place], [place] [address]     No results for input(s): LIPASE, AMYLASE in the last 168 hours. No results for input(s): AMMONIA in the last 168 hours.  Cardiac Enzymes: No results for input(s): CKTOTAL, CKMB, CKMBINDEX, TROPONINI in the last 168 hours. BNP (last 3 results) No results for input(s): BNP in the last 8760 hours.  ProBNP (last 3 results) No results for input(s): PROBNP in the last 8760 hours.  Studies:  CT Angio Abd/Pel W and/or Wo Contrast  Result Date: 11/23/2020 CLINICAL DATA:  GI bleed EXAM: CTA ABDOMEN AND PELVIS WITHOUT AND WITH CONTRAST TECHNIQUE: Multidetector CT imaging of the abdomen and pelvis was performed using the standard protocol during  bolus administration of intravenous contrast. Multiplanar reconstructed images and MIPs were obtained and reviewed to evaluate the vascular anatomy. CONTRAST:  116mL OMNIPAQUE IOHEXOL 350 MG/ML SOLN COMPARISON:  CT dated 10/24/2020 FINDINGS: VASCULAR Aorta: Normal caliber aorta without aneurysm, dissection, vasculitis or significant stenosis. Celiac: Patent without evidence of aneurysm, dissection, vasculitis or significant stenosis. SMA: Patent without evidence of aneurysm, dissection, vasculitis or significant stenosis. Renals: Both renal arteries are patent without evidence of aneurysm, dissection, vasculitis, fibromuscular dysplasia or significant stenosis. IMA: Patent without evidence of aneurysm, dissection, vasculitis or significant stenosis. Inflow: Patent without evidence of aneurysm, dissection, vasculitis or significant stenosis. Proximal Outflow: Bilateral common femoral and visualized portions of the superficial and profunda femoral arteries are patent without evidence of aneurysm, dissection, vasculitis or significant stenosis. Veins: No obvious venous abnormality within the limitations of this arterial phase study. Review of the MIP images confirms the above findings. NON-VASCULAR Lower chest: The lung bases are clear. The heart size is normal. Hepatobiliary: The liver is normal. Status post cholecystectomy.There is no biliary ductal dilation. Pancreas: Normal contours without ductal dilatation. No peripancreatic fluid collection. Spleen: Unremarkable. Adrenals/Urinary Tract: --Adrenal glands: Unremarkable. --Right kidney/ureter: Multiple right renal cysts are noted. --Left kidney/ureter: Multiple left renal cysts are noted. --Urinary bladder: Unremarkable. Stomach/Bowel: --Stomach/Duodenum: There is a linear hyperdense focus within the gastric lumen which may represent an ingested hyperdense pill. --Small bowel: Unremarkable. --Colon: There is scattered colonic diverticula without CT evidence for  diverticulitis. --Appendix: Normal. Lymphatic: --No retroperitoneal lymphadenopathy. --No mesenteric lymphadenopathy. --No pelvic or inguinal lymphadenopathy. Reproductive: Multiple calcified fibroids involving the patient's uterus. Other: No ascites or free air. The abdominal wall is normal. Musculoskeletal. No acute displaced fractures. IMPRESSION: 1. No acute findings. 2. Colonic diverticulosis without CT evidence for diverticulitis. 3. Fibroid uterus. Electronically Signed   By: Constance Holster M.D.   On: 11/23/2020 23:39       [place]   [facility] If 7PM-7AM, please contact night-coverage  at [email or web address], Office  [phone]   11/25/2020, 2:46 PM  LOS: 1 day

## 2020-11-26 ENCOUNTER — Encounter: Payer: Self-pay | Admitting: Gastroenterology

## 2020-11-26 ENCOUNTER — Inpatient Hospital Stay (HOSPITAL_COMMUNITY): Payer: No Typology Code available for payment source

## 2020-11-26 DIAGNOSIS — I10 Essential (primary) hypertension: Secondary | ICD-10-CM

## 2020-11-26 DIAGNOSIS — E876 Hypokalemia: Secondary | ICD-10-CM

## 2020-11-26 DIAGNOSIS — E119 Type 2 diabetes mellitus without complications: Secondary | ICD-10-CM

## 2020-11-26 LAB — SURGICAL PATHOLOGY

## 2020-11-26 LAB — HEMOGLOBIN AND HEMATOCRIT, BLOOD
HCT: 24.7 % — ABNORMAL LOW (ref 36.0–46.0)
HCT: 26.5 % — ABNORMAL LOW (ref 36.0–46.0)
HCT: 26.2 % — ABNORMAL LOW (ref 36.0–46.0)
Hemoglobin: 7.8 g/dL — ABNORMAL LOW (ref 12.0–15.0)
Hemoglobin: 8.3 g/dL — ABNORMAL LOW (ref 12.0–15.0)
Hemoglobin: 8.4 g/dL — ABNORMAL LOW (ref 12.0–15.0)

## 2020-11-26 LAB — GLUCOSE, CAPILLARY
Glucose-Capillary: 117 mg/dL — ABNORMAL HIGH (ref 70–99)
Glucose-Capillary: 140 mg/dL — ABNORMAL HIGH (ref 70–99)
Glucose-Capillary: 164 mg/dL — ABNORMAL HIGH (ref 70–99)

## 2020-11-26 MED ORDER — TECHNETIUM TC 99M-LABELED RED BLOOD CELLS IV KIT
22.0000 | PACK | Freq: Once | INTRAVENOUS | Status: AC | PRN
  Administered 2020-11-26: 22 via INTRAVENOUS

## 2020-11-26 NOTE — Progress Notes (Signed)
[facility] Progress Note  CC:  Recurrent lower GI bleed   Subjective:  She tolerated a soft diet for dinner last evening. No BM or blood per the rectum since Sat 11/23/2020. No N/V. No abdominal pain. Energy level a little lower today. She felt a little "woozie" in the head when up ambulating in the hall earlier this morning. No CP or SOB.   Objective:  Abdominal/pelvic CT angiogram 11/23/2020: 1. No acute findings. 2. Colonic diverticulosis without CT evidence for diverticulitis. 3. Fibroid uterus.  Vital signs in last 24 hours: Temp:  [98.2 F (36.8 C)-98.3 F (36.8 C)] 98.3 F (36.8 C) (01/11 0629) Pulse Rate:  [71-79] 76 (01/11 0629) Resp:  [17-20] 17 (01/11 0629) BP: (147-156)/(67-84) 153/83 (01/11 0629) SpO2:  [99 %-100 %] 100 % (01/11 0629) Last BM Date: 11/23/20   General:   Alert 60 year old female in NAD. Heart: RRR, no murmur.  Pulm: Breath sounds clear throughout.  Abdomen: Soft, nondistended. Tenderness above the umbilicus without rebound or guarding. + BS x 4 quads. No HSM.  Extremities:  Without edema. Neurologic:  Alert and  oriented x4;  grossly normal neurologically. Psych:  Alert and cooperative. Normal mood and affect.  Intake/Output from previous day: 01/10 0701 - 01/11 0700 In: 2440.5 [P.O.:720; I.V.:1611.4; IV Piggyback:109.1] Out: -  Intake/Output this shift: No intake/output data recorded.  Lab Results: Recent Labs    11/23/20 2018 11/24/20 0120 11/24/20 0400 11/24/20 1125 11/25/20 0333 11/25/20 1000 11/25/20 1549 11/25/20 2227 11/26/20 0336  WBC 11.4*  --  10.1  --  8.7  --   --   --   --   HGB 8.4*   < > 7.3*   < > 8.1*   < > 8.6* 8.1* 7.8*  HCT 27.3*   < > 24.1*   < > 25.6*   < > 27.5* 26.1* 24.7*  PLT 252  --  235  --  266  --   --   --   --    < > = values in this interval not displayed.   BMET Recent Labs    11/24/20 0400  NA 140  K 4.0  CL 107  CO2 26  GLUCOSE 217*  BUN 9  CREATININE 0.86  CALCIUM  8.3*   LFT No results for input(s): PROT, ALBUMIN, AST, ALT, ALKPHOS, BILITOT, BILIDIR, IBILI in the last 72 hours. PT/INR No results for input(s): LABPROT, INR in the last 72 hours. Hepatitis Panel No results for input(s): HEPBSAG, HCVAB, HEPAIGM, HEPBIGM in the last 72 hours.  No results found.  Assessment / Plan:  53.  60 year old female a history of mesenteric ischemia 2016, lower GI bleed in 2017 (abd/pelvic CT angiogram 06/05/2016 identified active diverticular bleed in the proximal descending colon just beyond the splenic flexure -> angiography by IR was negative) readmitted to the hospital 11/19/2020 with recurrent diverticular bleed and anemia. S/ P colonoscopy 11/22/2020 with pandiverticulosis and 2 polyps which were removed, patient had no further bleeding after that and was advanced to a soft diet and discharged home. She returned to the hospital a few hours later with recurrent lower GI bleeding.  An abdominal/pelvic CT angiogram 11/23/2020 showed evidence of diverticulosis without diverticulitis or active GI bleeding therefore IR intervention was not done.  Hg 7.5 -> 6.9. Transfused 1 unit of PRBCs 1/9 ->Hg 8.1 -> 9.0 -> 8.6 -> 8.1. Today Hg 7.8 (possibe dilutional component on LR @ 100cc/hr). No BM or  hematochezia  since 1/08.  Received Feraheme x 1 on 1/10. No significant abdominal pain.  Tolerating a soft diet. No plans for a subtotal colectomy at this time.  -Continue to monitor H/H closely  -Transfuse for Hg < 7.0 -Tagged red blood cell scan and contact surgery if brisk lower GI bleed recurs -Continue Protonix 40mg  QD -Small bowel capsule endoscopy to rule out other source of GI bleeding as an outpatient  -Patient advised to call nurse prior to ambulating in room or hallway -Await further recommendations per Dr. Bryan Lemma   2. History of Cushing's Syndrome   3. DM II  Principal Problem:   Diverticulosis of colon with hemorrhage Active Problems:   Essential hypertension    Hypokalemia   Insulin-requiring or dependent type II diabetes mellitus (HCC)   Anemia, blood loss     LOS: 2 days   Carol Mueller  11/26/2020, 0:815AM

## 2020-11-26 NOTE — Discharge Instructions (Signed)

## 2020-11-26 NOTE — Progress Notes (Signed)
Nuclear med tech notified and able to do scan and patient is agreeable D Clinical research associate

## 2020-11-26 NOTE — Progress Notes (Signed)
Patient having increasing abdominal pain heat pack used and up ambulating, dark stool with some blood in stool seen. Dr Darrick Meigs notified. With orders received. Bethann Punches RN

## 2020-11-26 NOTE — Discharge Summary (Signed)
Physician Discharge Summary  Carol Mueller SNK:[id number] DOB: 08/29/1961 DOA: 11/23/2020  PCP: [name], DO  Admit date: 11/23/2020 Discharge date: 11/26/2020  Time spent: 50 minutes  Recommendations for Outpatient Follow-up:  1. Follow-up gastroenterology in 2 weeks 2. Follow-up PCP in 1 week  Discharge Diagnoses:  Principal Problem:   Diverticulosis of colon with hemorrhage Active Problems:   Essential hypertension   Hypokalemia   Insulin-requiring or dependent type II diabetes mellitus (HCC)   Anemia, blood loss   Discharge Condition: Stable  Diet recommendation: High-fiber diet, carb modified diet  Filed Weights   11/23/20 1954  Weight: 110.7 kg    History of present illness:  60 year old female with a history of diabetes mellitus type 2, hypertension, Cushing syndrome, diverticulosis, hyperlipidemia came in for evaluation of recurrent rectal bleeding. She was discharged from the hospital on 11/23/2020 and return on the evening of 11/23/2020 due to having recurrent bleed from the rectum after returning home. She had colonoscopy on 11/22/2020 that did not show diverticular bleed or hemorrhaging. Bleeding was presumably from diverticular bleeding. CT angiography of the abdomen pelvis was done to see if there was acute bleed however it was negative.   Hospital Course:  1. *Recurrent diverticular bleed-patient had colonoscopy 2 days ago which was negative for bleed.  CTA abdomen pelvis was done during this admission which did not show source of bleed.  General surgery in the past had seen and offered subtotal colectomy however patient refused.  GI has been consulted and are following.    Patient did not have bleeding for past 2 days.  No plan for intervention at this time.  GI recommends to discharge patient home, plan for capsule endoscopy as outpatient. 2. Acute blood loss anemia-hemoglobin was 6.9 yesterday, she received 1 unit PRBC.  She also received 1 dose of Feraheme 510  mg IV x1.  Hemoglobin is 8.3 this morning.  3. Hypomagnesemia-magnesium was repleted, repeat magnesium is 1.8. 4. Diabetes mellitus type 2-continue home regimen. 5. Hypertension-continue Coreg, irbesartan.  Blood pressure is stable.   Procedures:    Consultations:  Gastroenterology  Discharge Exam: Vitals:   11/26/20 0629 11/26/20 1433  BP: (!) 153/83 (!) 142/80  Pulse: 76 76  Resp: 17 20  Temp: 98.3 F (36.8 C) 99 F (37.2 C)  SpO2: 100% 96%    General: Appears in no acute distress Cardiovascular: S1-S2, regular Respiratory: Clear to auscultation bilaterally  Discharge Instructions   Discharge Instructions    Diet - low sodium heart healthy   Complete by: As directed    Increase activity slowly   Complete by: As directed      Allergies as of 11/26/2020      Reactions   Norvasc [amlodipine Besylate] Swelling   ANGIOEDEMA   Nsaids Other (See Comments)   GI upset/ GI bleed   Sulfa Antibiotics Other (See Comments)   childhood      Medication List    TAKE these medications   acetaminophen 325 MG tablet Commonly known as: TYLENOL Take 2 tablets (650 mg total) by mouth every 6 (six) hours as needed for mild pain (or Fever >/= 101). What changed: when to take this   atorvastatin 10 MG tablet Commonly known as: LIPITOR Take 10 mg by mouth daily.   Basaglar KwikPen 100 UNIT/ML Inject 45 Units into the skin at bedtime.   calcium citrate-vitamin D 315-200 MG-UNIT tablet Commonly known as: CITRACAL+D Take 1 tablet by mouth daily.   carvedilol 25 MG tablet Commonly  known as: COREG Take 25 mg by mouth 2 (two) times daily.   dicyclomine 20 MG tablet Commonly known as: BENTYL Take 1 tablet (20 mg total) by mouth 2 (two) times daily as needed for spasms.   glimepiride 2 MG tablet Commonly known as: AMARYL Take 2 mg by mouth daily.   Jardiance 10 MG Tabs tablet Generic drug: empagliflozin Take 10 mg by mouth daily.   metFORMIN 1000 MG  tablet Commonly known as: GLUCOPHAGE Take 500 mg by mouth 2 (two) times daily with a meal.   multivitamin with minerals tablet Take 1 tablet by mouth daily.   olmesartan 40 MG tablet Commonly known as: BENICAR Take 40 mg by mouth daily.   pantoprazole 40 MG tablet Commonly known as: Protonix Take 1 tablet (40 mg total) by mouth daily.   Potassium Chloride ER 20 MEQ Tbcr Take 20 mEq by mouth daily.      Allergies  Allergen Reactions  . Norvasc [Amlodipine Besylate] Swelling    ANGIOEDEMA  . Nsaids Other (See Comments)    GI upset/ GI bleed  . Sulfa Antibiotics Other (See Comments)    childhood      The results of significant diagnostics from this hospitalization (including imaging, microbiology, ancillary and laboratory) are listed below for reference.    Significant Diagnostic Studies: CT Angio Abd/Pel W and/or Wo Contrast  Result Date: 11/23/2020 CLINICAL DATA:  GI bleed EXAM: CTA ABDOMEN AND PELVIS WITHOUT AND WITH CONTRAST TECHNIQUE: Multidetector CT imaging of the abdomen and pelvis was performed using the standard protocol during bolus administration of intravenous contrast. Multiplanar reconstructed images and MIPs were obtained and reviewed to evaluate the vascular anatomy. CONTRAST:  133mL OMNIPAQUE IOHEXOL 350 MG/ML SOLN COMPARISON:  CT dated 10/24/2020 FINDINGS: VASCULAR Aorta: Normal caliber aorta without aneurysm, dissection, vasculitis or significant stenosis. Celiac: Patent without evidence of aneurysm, dissection, vasculitis or significant stenosis. SMA: Patent without evidence of aneurysm, dissection, vasculitis or significant stenosis. Renals: Both renal arteries are patent without evidence of aneurysm, dissection, vasculitis, fibromuscular dysplasia or significant stenosis. IMA: Patent without evidence of aneurysm, dissection, vasculitis or significant stenosis. Inflow: Patent without evidence of aneurysm, dissection, vasculitis or significant stenosis. Proximal  Outflow: Bilateral common femoral and visualized portions of the superficial and profunda femoral arteries are patent without evidence of aneurysm, dissection, vasculitis or significant stenosis. Veins: No obvious venous abnormality within the limitations of this arterial phase study. Review of the MIP images confirms the above findings. NON-VASCULAR Lower chest: The lung bases are clear. The heart size is normal. Hepatobiliary: The liver is normal. Status post cholecystectomy.There is no biliary ductal dilation. Pancreas: Normal contours without ductal dilatation. No peripancreatic fluid collection. Spleen: Unremarkable. Adrenals/Urinary Tract: --Adrenal glands: Unremarkable. --Right kidney/ureter: Multiple right renal cysts are noted. --Left kidney/ureter: Multiple left renal cysts are noted. --Urinary bladder: Unremarkable. Stomach/Bowel: --Stomach/Duodenum: There is a linear hyperdense focus within the gastric lumen which may represent an ingested hyperdense pill. --Small bowel: Unremarkable. --Colon: There is scattered colonic diverticula without CT evidence for diverticulitis. --Appendix: Normal. Lymphatic: --No retroperitoneal lymphadenopathy. --No mesenteric lymphadenopathy. --No pelvic or inguinal lymphadenopathy. Reproductive: Multiple calcified fibroids involving the patient's uterus. Other: No ascites or free air. The abdominal wall is normal. Musculoskeletal. No acute displaced fractures. IMPRESSION: 1. No acute findings. 2. Colonic diverticulosis without CT evidence for diverticulitis. 3. Fibroid uterus. Electronically Signed   By: Constance Holster M.D.   On: 11/23/2020 23:39    Microbiology: Recent Results (from the past 240 hour(s))  Resp Panel  by RT-PCR (Flu A&B, Covid) Nasopharyngeal Swab     Status: None   Collection Time: 11/19/20  4:58 PM   Specimen: Nasopharyngeal Swab; Nasopharyngeal(NP) swabs in vial transport medium  Result Value Ref Range Status   SARS Coronavirus 2 by RT PCR  NEGATIVE NEGATIVE Final    Comment: (NOTE) SARS-CoV-2 target nucleic acids are NOT DETECTED.  The SARS-CoV-2 RNA is generally detectable in upper respiratory specimens during the acute phase of infection. The lowest concentration of SARS-CoV-2 viral copies this assay can detect is 138 copies/mL. A negative result does not preclude SARS-Cov-2 infection and should not be used as the sole basis for treatment or other patient management decisions. A negative result may occur with  improper specimen collection/handling, submission of specimen other than nasopharyngeal swab, presence of viral mutation(s) within the areas targeted by this assay, and inadequate number of viral copies(<138 copies/mL). A negative result must be combined with clinical observations, patient history, and epidemiological information. The expected result is Negative.  Fact Sheet for Patients:  EntrepreneurPulse.com.au  Fact Sheet for Healthcare Providers:  IncredibleEmployment.be  This test is no t yet approved or cleared by the [place] FDA and  has been authorized for detection and/or diagnosis of SARS-CoV-2 by FDA under an Emergency Use Authorization (EUA). This EUA will remain  in effect (meaning this test can be used) for the duration of the COVID-19 declaration under Section 564(b)(1) of the Act, 21 U.S.C.section 360bbb-3(b)(1), unless the authorization is terminated  or revoked sooner.       Influenza A by PCR NEGATIVE NEGATIVE Final   Influenza B by PCR NEGATIVE NEGATIVE Final    Comment: (NOTE) The Xpert Xpress SARS-CoV-2/FLU/RSV plus assay is intended as an aid in the diagnosis of influenza from Nasopharyngeal swab specimens and should not be used as a sole basis for treatment. Nasal washings and aspirates are unacceptable for Xpert Xpress SARS-CoV-2/FLU/RSV testing.  Fact Sheet for Patients: EntrepreneurPulse.com.au  Fact Sheet for  Healthcare Providers: IncredibleEmployment.be  This test is not yet approved or cleared by the [place] FDA and has been authorized for detection and/or diagnosis of SARS-CoV-2 by FDA under an Emergency Use Authorization (EUA). This EUA will remain in effect (meaning this test can be used) for the duration of the COVID-19 declaration under Section 564(b)(1) of the Act, 21 U.S.C. section 360bbb-3(b)(1), unless the authorization is terminated or revoked.  Performed at [facility], [place] [address], [place],  [address]   Resp Panel by RT-PCR (Flu A&B, Covid) Nasopharyngeal Swab     Status: None   Collection Time: 11/24/20 12:55 AM   Specimen: Nasopharyngeal Swab; Nasopharyngeal(NP) swabs in vial transport medium  Result Value Ref Range Status   SARS Coronavirus 2 by RT PCR NEGATIVE NEGATIVE Final    Comment: (NOTE) SARS-CoV-2 target nucleic acids are NOT DETECTED.  The SARS-CoV-2 RNA is generally detectable in upper respiratory specimens during the acute phase of infection. The lowest concentration of SARS-CoV-2 viral copies this assay can detect is 138 copies/mL. A negative result does not preclude SARS-Cov-2 infection and should not be used as the sole basis for treatment or other patient management decisions. A negative result may occur with  improper specimen collection/handling, submission of specimen other than nasopharyngeal swab, presence of viral mutation(s) within the areas targeted by this assay, and inadequate number of viral copies(<138 copies/mL). A negative result must be combined with clinical observations, patient history, and epidemiological information. The expected result is Negative.  Fact Sheet for  Patients:  EntrepreneurPulse.com.au  Fact Sheet for Healthcare Providers:  IncredibleEmployment.be  This test is no t yet approved or cleared by the [place] FDA and  has  been authorized for detection and/or diagnosis of SARS-CoV-2 by FDA under an Emergency Use Authorization (EUA). This EUA will remain  in effect (meaning this test can be used) for the duration of the COVID-19 declaration under Section 564(b)(1) of the Act, 21 U.S.C.section 360bbb-3(b)(1), unless the authorization is terminated  or revoked sooner.       Influenza A by PCR NEGATIVE NEGATIVE Final   Influenza B by PCR NEGATIVE NEGATIVE Final    Comment: (NOTE) The Xpert Xpress SARS-CoV-2/FLU/RSV plus assay is intended as an aid in the diagnosis of influenza from Nasopharyngeal swab specimens and should not be used as a sole basis for treatment. Nasal washings and aspirates are unacceptable for Xpert Xpress SARS-CoV-2/FLU/RSV testing.  Fact Sheet for Patients: EntrepreneurPulse.com.au  Fact Sheet for Healthcare Providers: IncredibleEmployment.be  This test is not yet approved or cleared by the [place] FDA and has been authorized for detection and/or diagnosis of SARS-CoV-2 by FDA under an Emergency Use Authorization (EUA). This EUA will remain in effect (meaning this test can be used) for the duration of the COVID-19 declaration under Section 564(b)(1) of the Act, 21 U.S.C. section 360bbb-3(b)(1), unless the authorization is terminated or revoked.  Performed at [facility], [place] [address], [place], [place] [address]      Labs: Basic Metabolic Panel: Recent Labs  Lab 11/20/20 0747 11/21/20 0612 11/22/20 0038 11/22/20 2357 11/24/20 0400 11/25/20 0333  NA 134* 135 136 136 140  --   K 4.1 4.3 4.0 3.3* 4.0  --   CL 100 104 106 104 107  --   CO2 24 23 20* 24 26  --   GLUCOSE 152* 200* 192* 135* 217*  --   BUN 23* 18 13 <5* 9  --   CREATININE 0.91 0.82 0.86 0.76 0.86  --   CALCIUM 9.0 8.5* 8.6* 8.4* 8.3*  --   MG  --   --  1.7 1.6*  --  1.8   Liver Function Tests: Recent Labs  Lab 11/20/20 0747  AST 17   ALT 16  ALKPHOS 49  BILITOT 0.6  PROT 7.9  ALBUMIN 4.1   No results for input(s): LIPASE, AMYLASE in the last 168 hours. No results for input(s): AMMONIA in the last 168 hours. CBC: Recent Labs  Lab 11/20/20 0747 11/21/20 0612 11/21/20 1803 11/23/20 2018 11/24/20 0120 11/24/20 0400 11/24/20 1125 11/25/20 0333 11/25/20 1000 11/25/20 1549 11/25/20 2227 11/26/20 0336 11/26/20 1102  WBC 9.2 7.8  --  11.4*  --  10.1  --  8.7  --   --   --   --   --   HGB 11.0* 9.5*   < > 8.4*   < > 7.3*   < > 8.1* 9.0* 8.6* 8.1* 7.8* 8.3*  HCT 35.8* 31.8*   < > 27.3*   < > 24.1*   < > 25.6* 28.5* 27.5* 26.1* 24.7* 26.5*  MCV 73.1* 75.4*  --  78.0*  --  78.0*  --  78.8*  --   --   --   --   --   PLT 253 271  --  252  --  235  --  266  --   --   --   --   --    < > = values in this  interval not displayed.   Cardiac Enzymes: No results for input(s): CKTOTAL, CKMB, CKMBINDEX, TROPONINI in the last 168 hours. BNP: BNP (last 3 results) No results for input(s): BNP in the last 8760 hours.  ProBNP (last 3 results) No results for input(s): PROBNP in the last 8760 hours.  CBG: Recent Labs  Lab 11/25/20 1247 11/25/20 1645 11/25/20 2226 11/26/20 0751 11/26/20 1138  GLUCAP 104* 151* 117* 117* 140*       Signed:  Oswald Hillock MD.  [facility] 11/26/2020, 4:45 PM

## 2020-11-26 NOTE — Progress Notes (Signed)
[facility]  PROGRESS NOTE  TORY MCKISSACK ACZ:[id number] DOB: 10/08/1961 DOA: 11/23/2020 PCP: [name], DO   Brief HPI:   60 year old female with a history of diabetes mellitus type 2, hypertension, Cushing syndrome, diverticulosis, hyperlipidemia came in for evaluation of recurrent rectal bleeding.  She was discharged from the hospital on 11/23/2020 and return on the evening of 11/23/2020 due to having recurrent bleed from the rectum after returning home.  She had colonoscopy on 11/22/2020 that did not show diverticular bleed or hemorrhaging.  Bleeding was presumably from diverticular bleeding.  CT angiography of the abdomen pelvis was done to see if there was acute bleed however it was negative.    Subjective   Patient seen and examined, denies any bloody bowel movement.   Assessment/Plan:     1. Recurrent diverticular bleed-patient had colonoscopy 2 days ago which was negative for bleed.  CTA abdomen pelvis was done during this admission which did not show source of bleed.  General surgery in the past had seen and offered subtotal colectomy however patient refused.  GI has been consulted and are following.  No plan for intervention at this time.  GI recommends getting nuclear RBC tagged scan and if positive consult general surgery.  Will order NM GI bleeding scan. 2. Acute blood loss anemia-hemoglobin was 6.9 yesterday, she received 1 unit PRBC.  She also received 1 dose of Feraheme 510 mg IV x1.  Hemoglobin is 8.3 this morning.  3. Hypomagnesemia-magnesium was repleted, repeat magnesium is 1.8. 4. Diabetes mellitus type 2-continue Lantus 45 units subcu daily, sliding scale insulin with NovoLog.  We'll hold metformin and empagliflozin 5. Hypertension-continue Coreg, irbesartan.  Blood pressure is stable.     COVID-19 Labs  No results for input(s): DDIMER, FERRITIN, LDH, CRP in the last 72 hours.  Lab Results  Component Value Date   SARSCOV2NAA NEGATIVE 11/24/2020    [place] NEGATIVE 11/19/2020   [place] NEGATIVE 10/23/2020   [place] NEGATIVE 09/23/2020     Scheduled medications:   . atorvastatin  10 mg Oral Daily  . carvedilol  25 mg Oral BID  . insulin aspart  0-15 Units Subcutaneous TID WC  . insulin aspart  0-5 Units Subcutaneous QHS  . insulin glargine  45 Units Subcutaneous QHS  . irbesartan  300 mg Oral Daily  . pantoprazole  40 mg Oral Daily  . potassium chloride SA  20 mEq Oral Daily         CBG: Recent Labs  Lab 11/25/20 1247 11/25/20 1645 11/25/20 2226 11/26/20 0751 11/26/20 1138  GLUCAP 104* 151* 117* 117* 140*    SpO2: 96 %    CBC: Recent Labs  Lab 11/20/20 0747 11/21/20 0612 11/21/20 1803 11/23/20 2018 11/24/20 0120 11/24/20 0400 11/24/20 1125 11/25/20 0333 11/25/20 1000 11/25/20 1549 11/25/20 2227 11/26/20 0336 11/26/20 1102  WBC 9.2 7.8  --  11.4*  --  10.1  --  8.7  --   --   --   --   --   HGB 11.0* 9.5*   < > 8.4*   < > 7.3*   < > 8.1* 9.0* 8.6* 8.1* 7.8* 8.3*  HCT 35.8* 31.8*   < > 27.3*   < > 24.1*   < > 25.6* 28.5* 27.5* 26.1* 24.7* 26.5*  MCV 73.1* 75.4*  --  78.0*  --  78.0*  --  78.8*  --   --   --   --   --   PLT 253 271  --  252  --  235  --  266  --   --   --   --   --    < > = values in this interval not displayed.    Basic Metabolic Panel: Recent Labs  Lab 11/20/20 0747 11/21/20 0612 11/22/20 0038 11/22/20 2357 11/24/20 0400 11/25/20 0333  NA 134* 135 136 136 140  --   K 4.1 4.3 4.0 3.3* 4.0  --   CL 100 104 106 104 107  --   CO2 24 23 20* 24 26  --   GLUCOSE 152* 200* 192* 135* 217*  --   BUN 23* 18 13 <5* 9  --   CREATININE 0.91 0.82 0.86 0.76 0.86  --   CALCIUM 9.0 8.5* 8.6* 8.4* 8.3*  --   MG  --   --  1.7 1.6*  --  1.8     Liver Function Tests: Recent Labs  Lab 11/20/20 0747  AST 17  ALT 16  ALKPHOS 49  BILITOT 0.6  PROT 7.9  ALBUMIN 4.1     Antibiotics: Anti-infectives (From admission, onward)   None       DVT prophylaxis:  SCDs  Code Status: Full code  Family Communication: No family at bedside   Consultants:  Gastroenterology  Procedures:      Objective   Vitals:   11/25/20 1404 11/25/20 2225 11/26/20 0629 11/26/20 1433  BP: (!) 150/67 (!) 147/84 (!) 153/83 (!) 142/80  Pulse: 75 71 76 76  Resp: 20 17 17 20   Temp: 98.2 F (36.8 C) 98.2 F (36.8 C) 98.3 F (36.8 C) 99 F (37.2 C)  TempSrc: Oral Oral Oral Oral  SpO2: 100% 99% 100% 96%  Weight:      Height:        Intake/Output Summary (Last 24 hours) at 11/26/2020 1511 Last data filed at 11/26/2020 1300 Gross per 24 hour  Intake 2225.27 ml  Output 2 ml  Net 2223.27 ml    01/09 1901 - 01/11 0700 In: 3644.7 [P.O.:720; I.V.:2411.4] Out: -   Filed Weights   11/23/20 1954  Weight: 110.7 kg    Physical Examination:   General-appears in no acute distress Heart-S1-S2, regular, no murmur auscultated Lungs-clear to auscultation bilaterally, no wheezing or crackles auscultated Abdomen-soft, nontender, no organomegaly Extremities-no edema in the lower extremities Neuro-alert, oriented x3, no focal deficit noted  Status is: Inpatient  Dispo: The patient is from: Home              Anticipated d/c is to: Home              Anticipated d/c date is: 11/27/2020              Patient currently not stable for discharge  Barrier to discharge-GI bleed evaluation       Data Reviewed:   Recent Results (from the past 240 hour(s))  Resp Panel by RT-PCR (Flu A&B, Covid) Nasopharyngeal Swab     Status: None   Collection Time: 11/19/20  4:58 PM   Specimen: Nasopharyngeal Swab; Nasopharyngeal(NP) swabs in vial transport medium  Result Value Ref Range Status   SARS Coronavirus 2 by RT PCR NEGATIVE NEGATIVE Final    Comment: (NOTE) SARS-CoV-2 target nucleic acids are NOT DETECTED.  The SARS-CoV-2 RNA is generally detectable in upper respiratory specimens during the acute phase of infection. The lowest concentration of SARS-CoV-2 viral  copies this assay can detect is 138 copies/mL. A negative result does not preclude SARS-Cov-2  infection and should not be used as the sole basis for treatment or other patient management decisions. A negative result may occur with  improper specimen collection/handling, submission of specimen other than nasopharyngeal swab, presence of viral mutation(s) within the areas targeted by this assay, and inadequate number of viral copies(<138 copies/mL). A negative result must be combined with clinical observations, patient history, and epidemiological information. The expected result is Negative.  Fact Sheet for Patients:  EntrepreneurPulse.com.au  Fact Sheet for Healthcare Providers:  IncredibleEmployment.be  This test is no t yet approved or cleared by the [place] FDA and  has been authorized for detection and/or diagnosis of SARS-CoV-2 by FDA under an Emergency Use Authorization (EUA). This EUA will remain  in effect (meaning this test can be used) for the duration of the COVID-19 declaration under Section 564(b)(1) of the Act, 21 U.S.C.section 360bbb-3(b)(1), unless the authorization is terminated  or revoked sooner.       Influenza A by PCR NEGATIVE NEGATIVE Final   Influenza B by PCR NEGATIVE NEGATIVE Final    Comment: (NOTE) The Xpert Xpress SARS-CoV-2/FLU/RSV plus assay is intended as an aid in the diagnosis of influenza from Nasopharyngeal swab specimens and should not be used as a sole basis for treatment. Nasal washings and aspirates are unacceptable for Xpert Xpress SARS-CoV-2/FLU/RSV testing.  Fact Sheet for Patients: EntrepreneurPulse.com.au  Fact Sheet for Healthcare Providers: IncredibleEmployment.be  This test is not yet approved or cleared by the [place] FDA and has been authorized for detection and/or diagnosis of SARS-CoV-2 by FDA under an Emergency Use Authorization (EUA). This  EUA will remain in effect (meaning this test can be used) for the duration of the COVID-19 declaration under Section 564(b)(1) of the Act, 21 U.S.C. section 360bbb-3(b)(1), unless the authorization is terminated or revoked.  Performed at [facility], [place] [address], [place], [place] [address]   Resp Panel by RT-PCR (Flu A&B, Covid) Nasopharyngeal Swab     Status: None   Collection Time: 11/24/20 12:55 AM   Specimen: Nasopharyngeal Swab; Nasopharyngeal(NP) swabs in vial transport medium  Result Value Ref Range Status   SARS Coronavirus 2 by RT PCR NEGATIVE NEGATIVE Final    Comment: (NOTE) SARS-CoV-2 target nucleic acids are NOT DETECTED.  The SARS-CoV-2 RNA is generally detectable in upper respiratory specimens during the acute phase of infection. The lowest concentration of SARS-CoV-2 viral copies this assay can detect is 138 copies/mL. A negative result does not preclude SARS-Cov-2 infection and should not be used as the sole basis for treatment or other patient management decisions. A negative result may occur with  improper specimen collection/handling, submission of specimen other than nasopharyngeal swab, presence of viral mutation(s) within the areas targeted by this assay, and inadequate number of viral copies(<138 copies/mL). A negative result must be combined with clinical observations, patient history, and epidemiological information. The expected result is Negative.  Fact Sheet for Patients:  EntrepreneurPulse.com.au  Fact Sheet for Healthcare Providers:  IncredibleEmployment.be  This test is no t yet approved or cleared by the [place] FDA and  has been authorized for detection and/or diagnosis of SARS-CoV-2 by FDA under an Emergency Use Authorization (EUA). This EUA will remain  in effect (meaning this test can be used) for the duration of the COVID-19 declaration under Section 564(b)(1) of the Act,  21 U.S.C.section 360bbb-3(b)(1), unless the authorization is terminated  or revoked sooner.       Influenza A by PCR NEGATIVE NEGATIVE Final   Influenza  B by PCR NEGATIVE NEGATIVE Final    Comment: (NOTE) The Xpert Xpress SARS-CoV-2/FLU/RSV plus assay is intended as an aid in the diagnosis of influenza from Nasopharyngeal swab specimens and should not be used as a sole basis for treatment. Nasal washings and aspirates are unacceptable for Xpert Xpress SARS-CoV-2/FLU/RSV testing.  Fact Sheet for Patients: EntrepreneurPulse.com.au  Fact Sheet for Healthcare Providers: IncredibleEmployment.be  This test is not yet approved or cleared by the [place] FDA and has been authorized for detection and/or diagnosis of SARS-CoV-2 by FDA under an Emergency Use Authorization (EUA). This EUA will remain in effect (meaning this test can be used) for the duration of the COVID-19 declaration under Section 564(b)(1) of the Act, 21 U.S.C. section 360bbb-3(b)(1), unless the authorization is terminated or revoked.  Performed at [facility], [place] [address] [place], [place] [address]     No results for input(s): LIPASE, AMYLASE in the last 168 hours. No results for input(s): AMMONIA in the last 168 hours.  Cardiac Enzymes: No results for input(s): CKTOTAL, CKMB, CKMBINDEX, TROPONINI in the last 168 hours. BNP (last 3 results) No results for input(s): BNP in the last 8760 hours.  ProBNP (last 3 results) No results for input(s): PROBNP in the last 8760 hours.  Studies:  No results found.     Oswald Hillock   [facility] If 7PM-7AM, please contact night-coverage at [email or web address], Office  [phone]   11/26/2020, 3:11 PM  LOS: 2 days

## 2020-11-27 ENCOUNTER — Encounter (HOSPITAL_COMMUNITY): Payer: Self-pay | Admitting: Family Medicine

## 2020-11-27 DIAGNOSIS — K921 Melena: Secondary | ICD-10-CM

## 2020-11-27 LAB — BASIC METABOLIC PANEL
Anion gap: 9 (ref 5–15)
BUN: 6 mg/dL (ref 6–20)
CO2: 26 mmol/L (ref 22–32)
Calcium: 9 mg/dL (ref 8.9–10.3)
Chloride: 105 mmol/L (ref 98–111)
Creatinine, Ser: 0.71 mg/dL (ref 0.44–1.00)
GFR, Estimated: >60 mL/min (ref >60)
Glucose, Bld: 208 mg/dL — ABNORMAL HIGH (ref 70–99)
Potassium: 3.5 mmol/L (ref 3.5–5.1)
Sodium: 140 mmol/L (ref 135–145)

## 2020-11-27 LAB — GLUCOSE, CAPILLARY
Glucose-Capillary: 126 mg/dL — ABNORMAL HIGH (ref 70–99)
Glucose-Capillary: 131 mg/dL — ABNORMAL HIGH (ref 70–99)
Glucose-Capillary: 133 mg/dL — ABNORMAL HIGH (ref 70–99)
Glucose-Capillary: 140 mg/dL — ABNORMAL HIGH (ref 70–99)
Glucose-Capillary: 164 mg/dL — ABNORMAL HIGH (ref 70–99)

## 2020-11-27 LAB — HEMOGLOBIN AND HEMATOCRIT, BLOOD
HCT: 26.3 % — ABNORMAL LOW (ref 36.0–46.0)
Hemoglobin: 8.2 g/dL — ABNORMAL LOW (ref 12.0–15.0)

## 2020-11-27 MED ORDER — PEG-KCL-NACL-NASULF-NA ASC-C 100 G PO SOLR
0.5000 | Freq: Once | ORAL | Status: AC
  Administered 2020-11-27: 100 g via ORAL
  Filled 2020-11-27: qty 1

## 2020-11-27 MED ORDER — SODIUM CHLORIDE 0.9 % IV SOLN: INTRAVENOUS | Status: DC

## 2020-11-27 MED ORDER — DICYCLOMINE HCL 20 MG PO TABS
20.0000 mg | ORAL_TABLET | Freq: Two times a day (BID) | ORAL | Status: DC | PRN
  Administered 2020-11-27: 20 mg via ORAL
  Filled 2020-11-27 (×3): qty 1

## 2020-11-27 NOTE — Plan of Care (Signed)

## 2020-11-27 NOTE — Plan of Care (Signed)

## 2020-11-27 NOTE — Progress Notes (Signed)
[facility] Progress Note  CC:  Recurrent lower GI bleed  Subjective: She developed epigastric pain rated 10 out of 10 yesterday around 6:30pm while eating dinner. She reported having upper abdominal "soreness" in the past but this pain was new. She passed a solid black stool and she saw burgundy colored blood on the toilet tissue. Approximately 30 minutes later, she passed a darker black solid stool without any noticeable blood on the toilet tissue. No further BM or blood per the rectum since then. She had mild epigastric pain while eating breakfast this morning. No nausea or vomiting.   Objective:  Vital signs in last 24 hours: Temp:  [98.1 F (36.7 C)-99 F (37.2 C)] 98.1 F (36.7 C) (01/12 0713) Pulse Rate:  [70-76] 70 (01/12 0713) Resp:  [15-20] 15 (01/12 0713) BP: (142-155)/(80-82) 155/80 (01/12 0713) SpO2:  [96 %-98 %] 98 % (01/12 0713) Last BM Date: 11/26/20   General:   Alert 60 year old female in NAD. Heart: RRR, no murmur.  Pulm:  Breath sounds clear throughout.  Abdomen: Soft, nondistended. Epigastric tenderness without rebound or guarding.  Extremities:  Without edema. Neurologic:  Alert and  oriented x4;  grossly normal neurologically. Psych:  Alert and cooperative. Normal mood and affect.  Intake/Output from previous day: 01/11 0701 - 01/12 0700 In: 1033.5 [P.O.:720; I.V.:313.5] Out: 4 [Urine:2; Stool:2] Intake/Output this shift: No intake/output data recorded.  Lab Results: Recent Labs    11/25/20 0333 11/25/20 1000 11/26/20 1102 11/26/20 1640 11/27/20 0333  WBC 8.7  --   --   --   --   HGB 8.1*   < > 8.3* 8.4* 8.2*  HCT 25.6*   < > 26.5* 26.2* 26.3*  PLT 266  --   --   --   --    < > = values in this interval not displayed.   BMET No results for input(s): NA, K, CL, CO2, GLUCOSE, BUN, CREATININE, CALCIUM in the last 72 hours. LFT No results for input(s): PROT, ALBUMIN, AST, ALT, ALKPHOS, BILITOT, BILIDIR, IBILI in the last 72  hours. PT/INR No results for input(s): LABPROT, INR in the last 72 hours. Hepatitis Panel No results for input(s): HEPBSAG, HCVAB, HEPAIGM, HEPBIGM in the last 72 hours.  NM GI Blood Loss  Result Date: 11/26/2020 CLINICAL DATA:  GI bleed EXAM: NUCLEAR MEDICINE GASTROINTESTINAL BLEEDING SCAN TECHNIQUE: Sequential abdominal images were obtained following intravenous administration of Tc-22m labeled red blood cells. RADIOPHARMACEUTICALS:  22.0 mCi Tc-34m pertechnetate in-vitro labeled red cells. COMPARISON:  CT 11/23/2020 FINDINGS: Imaging was continued for 2 hours. There is no radiotracer accumulation seen to suggest or localize GI bleed. IMPRESSION: No evidence of active GI bleed by nuclear imaging. Electronically Signed   By: Rolm Baptise M.D.   On: 11/26/2020 23:44    Assessment / Plan:  13.  60 year old female with recurrent lower GI bleeding, presumed diverticular. She was admitted to the hospital 1/4/2022withrecurrent lower GI bleedingand anemia. S/ P colonoscopy1/05/2021 identified pandiverticulosis and 2 benign colon polyps were removed without evidence of active bleeding. She remained hemodynamically stable without further hematochezia and she was discharged home 1/8. She returned to the hospital a few hours later with recurrent lower GI bleeding.An abdominal/pelvic CT angiogram 1/08/2022showed evidence of diverticulosis without diverticulitis or active GI bleedingtherefore IR interventionwasnot done.  Hg 7.5 ->6.9. Transfused 1 unit of PRBCs 1/9 ->Hg 8.1 -> 9.0 -> 8.6 -> 8.1 -> 7.8 ->7.8 -> 8.3 -> 8.4. Today Hg 8.2. Received Feraheme x  1 on 1/10. She developed 10 out of 10 epigastrici pain yesterday while eating dinner then passed a solid black stool with burgundy colored blood on the toilet tissue, 30 minutes later she passed a second black solid stool.  A stat tagged RBC scan was negative.  No further BM or hematochezia this morning.  She continues to have mild epigastric pain. No  N/V. Plan for EGD with [facility] capsule endoscopy tomorrow, if results unrevealing then further discussion regarding subtotal colectomy to be reconsidered.  -Clear liquid diet then NPO after midnight -EGD with capsule endoscopy tomorrow, benefits and risks discussed including risk with sedation, risk of bleeding, perforation and infection. Also discussed if the capsule did not pass surgical intervention may be required to remove it. - One dose of Movi prep this evening -Cancel Q 6 H/H -CBC in am.  -Stat H/H if patient demonstrates active GI bleeding -BMP now -Await further recommendations per Dr. Bryan Lemma  2. Epigastric pain. Normal EGD 07/16/2015.  -See plan in # 1 -Continue Pantoprazole 40mg  po QD   3. History of Cushing'sSyndrome   4. DM II    Principal Problem:   Diverticulosis of colon with hemorrhage Active Problems:   Essential hypertension   Hypokalemia   Insulin-requiring or dependent type II diabetes mellitus (HCC)   Anemia, blood loss     LOS: 3 days   Carol Mueller  11/27/2020,  0:934AM

## 2020-11-27 NOTE — Progress Notes (Addendum)
[facility] PROGRESS NOTE    Progress Note  Carol Mueller  OAC:[id number] DOB: 02-26-1961 DOA: 11/23/2020 PCP: [name], DO     Brief Narrative:   Carol Mueller is an 60 y.o. female past medical history of diabetes mellitus type 2, essential hypertension, Cushing syndrome history of recurrent diverticulosis with rectal bleeding discharged from the hospital on 11/23/2020 return on that evening due to having recurrent bleeding. She has had a history of a colonoscopy in 2022 that did not show diverticular bleed. Her bleeding in the past has been presumed it is diverticular in nature. CT angio of the abdomen and pelvis was done that showed no acute bleedings. She was supposed to be discharged on a 11/26/2020, but she started having dark black tarry stool.  Assessment/Plan:   Diverticulosis of colon with hemorrhage CTA of the abdomen and pelvis done on this admission did not show source of bleeding. Surgery has been consulted who offered subtotal colectomy however the patient refused. GI was consulted and she had no signs of bleeding so they did not recommend any intervention. Bleeding scan has been done that showed no source of bleeding. Her hemoglobin has remained stable. GI recommended endoscopy tomorrow morning, bowel prep for possible Endoscopy Tomorrow  Acute blood loss anemia: Hemoglobin on admission was 6.9 she was transfused 1 unit of packed red blood cells and was given IV iron. This morning her hemoglobin is 8.3 and has remained stable for the last 24 hours.  Electrolyte imbalance hypomagnesemia: Was repleted now resolved.  Diabetes mellitus type 2: Continue Lantus plus sliding scale holding oral hypoglycemic agents.  Essential hypertension: No changes made to her medication continue Coreg and irbesartan.  DVT prophylaxis: none Family Communication:none Status is: Inpatient  Remains inpatient appropriate because:Hemodynamically unstable   Dispo: The  patient is from: Home              Anticipated d/c is to: Home              Anticipated d/c date is: 1 day              Patient currently is medically stable to d/c.        Code Status:     Code Status Orders  (From admission, onward)         Start     Ordered   11/24/20 0043  Full code  Continuous        11/24/20 0043        Code Status History    Date Active Date Inactive Code Status Order ID Comments User Context   11/19/2020 1817 11/23/2020 1943 Full Code [id number]  Lavina Hamman, MD ED   10/23/2020 1919 10/26/2020 1536 Full Code [id number]  Rise Patience, MD ED   09/23/2020 1556 09/25/2020 1528 Full Code [id number]  Jonnie Finner, DO Inpatient   02/29/2020 1353 03/03/2020 1515 Full Code [id number]  Tomma Rakers, MD ED   10/02/2019 0938 10/04/2019 2042 Full Code [id number]  Truddie Hidden, MD Inpatient   09/17/2019 1122 09/19/2019 1906 Full Code [id number]  Truddie Hidden, MD Inpatient   05/24/2019 2129 05/29/2019 1759 Full Code [id number]  Etta Quill, DO ED   06/02/2018 2124 06/03/2018 1550 Full Code [id number]  Vianne Bulls, MD ED   11/30/2016 1025 12/02/2016 1720 Full Code [id number]  Barton Dubois, MD ED   06/05/2016 1646 06/09/2016 1356 Full Code [id number]  Verlee Monte, MD Inpatient   07/15/2015 1509 07/17/2015 1814 Full Code  [id number]  Mendel Corning, MD ED   Advance Care Planning Activity        IV Access:    Peripheral IV   Procedures and diagnostic studies:   NM GI Blood Loss  Result Date: 11/26/2020 CLINICAL DATA:  GI bleed EXAM: NUCLEAR MEDICINE GASTROINTESTINAL BLEEDING SCAN TECHNIQUE: Sequential abdominal images were obtained following intravenous administration of Tc-37m labeled red blood cells. RADIOPHARMACEUTICALS:  22.0 mCi Tc-48m pertechnetate in-vitro labeled red cells. COMPARISON:  CT 11/23/2020 FINDINGS: Imaging was continued for 2 hours. There is no radiotracer accumulation seen to suggest or localize GI bleed. IMPRESSION: No  evidence of active GI bleed by nuclear imaging. Electronically Signed   By: Rolm Baptise M.D.   On: 11/26/2020 23:44     Medical Consultants:    None.  Anti-Infectives:   none  Subjective:    Altamease Oiler she denies no further black tarry stools no abdominal pain.  Objective:    Vitals:   11/26/20 0629 11/26/20 1433 11/26/20 2355 11/27/20 0713  BP: (!) 153/83 (!) 142/80 (!) 146/82 (!) 155/80  Pulse: 76 76 74 70  Resp: 17 20 15 15   Temp: 98.3 F (36.8 C) 99 F (37.2 C) 98.3 F (36.8 C) 98.1 F (36.7 C)  TempSrc: Oral Oral Oral Oral  SpO2: 100% 96% 97% 98%  Weight:      Height:       SpO2: 98 %   Intake/Output Summary (Last 24 hours) at 11/27/2020 0829 Last data filed at 11/26/2020 1857 Gross per 24 hour  Intake 793.48 ml  Output 4 ml  Net 789.48 ml   Filed Weights   11/23/20 1954  Weight: 110.7 kg    Exam: General exam: In no acute distress. Respiratory system: Good air movement and clear to auscultation. Cardiovascular system: S1 & S2 heard, RRR. No JVD. Gastrointestinal system: Abdomen is nondistended, soft and nontender.  Extremities: No pedal edema. Skin: No rashes, lesions or ulcers Psychiatry: Judgement and insight appear normal. Mood & affect appropriate.    Data Reviewed:    Labs: Basic Metabolic Panel: Recent Labs  Lab 11/21/20 0612 11/22/20 0038 11/22/20 2357 11/24/20 0400 11/25/20 0333  NA 135 136 136 140  --   K 4.3 4.0 3.3* 4.0  --   CL 104 106 104 107  --   CO2 23 20* 24 26  --   GLUCOSE 200* 192* 135* 217*  --   BUN 18 13 <5* 9  --   CREATININE 0.82 0.86 0.76 0.86  --   CALCIUM 8.5* 8.6* 8.4* 8.3*  --   MG  --  1.7 1.6*  --  1.8   GFR Estimated Creatinine Clearance: 81.6 mL/min (by C-G formula based on SCr of 0.86 mg/dL). Liver Function Tests: No results for input(s): AST, ALT, ALKPHOS, BILITOT, PROT, ALBUMIN in the last 168 hours. No results for input(s): LIPASE, AMYLASE in the last 168 hours. No results for  input(s): AMMONIA in the last 168 hours. Coagulation profile No results for input(s): INR, PROTIME in the last 168 hours. COVID-19 Labs  No results for input(s): DDIMER, FERRITIN, LDH, CRP in the last 72 hours.  Lab Results  Component Value Date   SARSCOV2NAA NEGATIVE 11/24/2020   [place] NEGATIVE 11/19/2020   [place] NEGATIVE 10/23/2020   [place] NEGATIVE 09/23/2020    CBC: Recent Labs  Lab 11/21/20 0612 11/21/20 1803 11/23/20 2018 11/24/20 0120 11/24/20 0400 11/24/20 1125 11/25/20 0333 11/25/20 1000 11/25/20 2227 11/26/20 0336 11/26/20 1102 11/26/20 1640  11/27/20 0333  WBC 7.8  --  11.4*  --  10.1  --  8.7  --   --   --   --   --   --   HGB 9.5*   < > 8.4*   < > 7.3*   < > 8.1*   < > 8.1* 7.8* 8.3* 8.4* 8.2*  HCT 31.8*   < > 27.3*   < > 24.1*   < > 25.6*   < > 26.1* 24.7* 26.5* 26.2* 26.3*  MCV 75.4*  --  78.0*  --  78.0*  --  78.8*  --   --   --   --   --   --   PLT 271  --  252  --  235  --  266  --   --   --   --   --   --    < > = values in this interval not displayed.   Cardiac Enzymes: No results for input(s): CKTOTAL, CKMB, CKMBINDEX, TROPONINI in the last 168 hours. BNP (last 3 results) No results for input(s): PROBNP in the last 8760 hours. CBG: Recent Labs  Lab 11/26/20 0751 11/26/20 1138 11/26/20 1739 11/26/20 2358 11/27/20 0734  GLUCAP 117* 140* 164* 131* 126*   D-Dimer: No results for input(s): DDIMER in the last 72 hours. Hgb A1c: No results for input(s): HGBA1C in the last 72 hours. Lipid Profile: No results for input(s): CHOL, HDL, LDLCALC, TRIG, CHOLHDL, LDLDIRECT in the last 72 hours. Thyroid function studies: No results for input(s): TSH, T4TOTAL, T3FREE, THYROIDAB in the last 72 hours.  Invalid input(s): FREET3 Anemia work up: No results for input(s): VITAMINB12, FOLATE, FERRITIN, TIBC, IRON, RETICCTPCT in the last 72 hours. Sepsis Labs: Recent Labs  Lab 11/21/20 0612 11/23/20 2018 11/24/20 0400 11/25/20 0333   WBC 7.8 11.4* 10.1 8.7   Microbiology Recent Results (from the past 240 hour(s))  Resp Panel by RT-PCR (Flu A&B, Covid) Nasopharyngeal Swab     Status: None   Collection Time: 11/19/20  4:58 PM   Specimen: Nasopharyngeal Swab; Nasopharyngeal(NP) swabs in vial transport medium  Result Value Ref Range Status   SARS Coronavirus 2 by RT PCR NEGATIVE NEGATIVE Final    Comment: (NOTE) SARS-CoV-2 target nucleic acids are NOT DETECTED.  The SARS-CoV-2 RNA is generally detectable in upper respiratory specimens during the acute phase of infection. The lowest concentration of SARS-CoV-2 viral copies this assay can detect is 138 copies/mL. A negative result does not preclude SARS-Cov-2 infection and should not be used as the sole basis for treatment or other patient management decisions. A negative result may occur with  improper specimen collection/handling, submission of specimen other than nasopharyngeal swab, presence of viral mutation(s) within the areas targeted by this assay, and inadequate number of viral copies(<138 copies/mL). A negative result must be combined with clinical observations, patient history, and epidemiological information. The expected result is Negative.  Fact Sheet for Patients:  EntrepreneurPulse.com.au  Fact Sheet for Healthcare Providers:  IncredibleEmployment.be  This test is no t yet approved or cleared by the [place] FDA and  has been authorized for detection and/or diagnosis of SARS-CoV-2 by FDA under an Emergency Use Authorization (EUA). This EUA will remain  in effect (meaning this test can be used) for the duration of the COVID-19 declaration under Section 564(b)(1) of the Act, 21 U.S.C.section 360bbb-3(b)(1), unless the authorization is terminated  or revoked sooner.       Influenza A by PCR  NEGATIVE NEGATIVE Final   Influenza B by PCR NEGATIVE NEGATIVE Final    Comment: (NOTE) The Xpert Xpress  SARS-CoV-2/FLU/RSV plus assay is intended as an aid in the diagnosis of influenza from Nasopharyngeal swab specimens and should not be used as a sole basis for treatment. Nasal washings and aspirates are unacceptable for Xpert Xpress SARS-CoV-2/FLU/RSV testing.  Fact Sheet for Patients: EntrepreneurPulse.com.au  Fact Sheet for Healthcare Providers: IncredibleEmployment.be  This test is not yet approved or cleared by the [place] FDA and has been authorized for detection and/or diagnosis of SARS-CoV-2 by FDA under an Emergency Use Authorization (EUA). This EUA will remain in effect (meaning this test can be used) for the duration of the COVID-19 declaration under Section 564(b)(1) of the Act, 21 U.S.C. section 360bbb-3(b)(1), unless the authorization is terminated or revoked.  Performed at [facility], [place] [address] [place], [place] [address]   Resp Panel by RT-PCR (Flu A&B, Covid) Nasopharyngeal Swab     Status: None   Collection Time: 11/24/20 12:55 AM   Specimen: Nasopharyngeal Swab; Nasopharyngeal(NP) swabs in vial transport medium  Result Value Ref Range Status   SARS Coronavirus 2 by RT PCR NEGATIVE NEGATIVE Final    Comment: (NOTE) SARS-CoV-2 target nucleic acids are NOT DETECTED.  The SARS-CoV-2 RNA is generally detectable in upper respiratory specimens during the acute phase of infection. The lowest concentration of SARS-CoV-2 viral copies this assay can detect is 138 copies/mL. A negative result does not preclude SARS-Cov-2 infection and should not be used as the sole basis for treatment or other patient management decisions. A negative result may occur with  improper specimen collection/handling, submission of specimen other than nasopharyngeal swab, presence of viral mutation(s) within the areas targeted by this assay, and inadequate number of viral copies(<138 copies/mL). A negative result must be  combined with clinical observations, patient history, and epidemiological information. The expected result is Negative.  Fact Sheet for Patients:  EntrepreneurPulse.com.au  Fact Sheet for Healthcare Providers:  IncredibleEmployment.be  This test is no t yet approved or cleared by the [place] FDA and  has been authorized for detection and/or diagnosis of SARS-CoV-2 by FDA under an Emergency Use Authorization (EUA). This EUA will remain  in effect (meaning this test can be used) for the duration of the COVID-19 declaration under Section 564(b)(1) of the Act, 21 U.S.C.section 360bbb-3(b)(1), unless the authorization is terminated  or revoked sooner.       Influenza A by PCR NEGATIVE NEGATIVE Final   Influenza B by PCR NEGATIVE NEGATIVE Final    Comment: (NOTE) The Xpert Xpress SARS-CoV-2/FLU/RSV plus assay is intended as an aid in the diagnosis of influenza from Nasopharyngeal swab specimens and should not be used as a sole basis for treatment. Nasal washings and aspirates are unacceptable for Xpert Xpress SARS-CoV-2/FLU/RSV testing.  Fact Sheet for Patients: EntrepreneurPulse.com.au  Fact Sheet for Healthcare Providers: IncredibleEmployment.be  This test is not yet approved or cleared by the [place] FDA and has been authorized for detection and/or diagnosis of SARS-CoV-2 by FDA under an Emergency Use Authorization (EUA). This EUA will remain in effect (meaning this test can be used) for the duration of the COVID-19 declaration under Section 564(b)(1) of the Act, 21 U.S.C. section 360bbb-3(b)(1), unless the authorization is terminated or revoked.  Performed at [facility], [place] [address] [place] [address]      Medications:   . atorvastatin  10 mg Oral Daily  . carvedilol  25  mg Oral BID  . insulin aspart  0-15 Units Subcutaneous TID WC  . insulin aspart   0-5 Units Subcutaneous QHS  . insulin glargine  45 Units Subcutaneous QHS  . irbesartan  300 mg Oral Daily  . pantoprazole  40 mg Oral Daily  . potassium chloride SA  20 mEq Oral Daily   Continuous Infusions: . lactated ringers 100 mL/hr at 11/26/20 0649      LOS: 3 days   Charlynne Cousins  [facility]  11/27/2020, 8:29 AM

## 2020-11-28 ENCOUNTER — Encounter (HOSPITAL_COMMUNITY)
Admission: EM | Disposition: A | Discharge status: Home / Self Care | Payer: Self-pay | Source: Home / Self Care | Attending: Family Medicine

## 2020-11-28 ENCOUNTER — Inpatient Hospital Stay (HOSPITAL_COMMUNITY): Payer: No Typology Code available for payment source | Admitting: Registered Nurse

## 2020-11-28 ENCOUNTER — Encounter (HOSPITAL_COMMUNITY): Payer: Self-pay | Admitting: Family Medicine

## 2020-11-28 DIAGNOSIS — K317 Polyp of stomach and duodenum: Secondary | ICD-10-CM

## 2020-11-28 DIAGNOSIS — Q398 Other congenital malformations of esophagus: Secondary | ICD-10-CM

## 2020-11-28 HISTORY — PX: GIVENS CAPSULE STUDY: SHX5432

## 2020-11-28 HISTORY — PX: ESOPHAGOGASTRODUODENOSCOPY (EGD) WITH PROPOFOL: SHX5813

## 2020-11-28 LAB — CBC
HCT: 27.6 % — ABNORMAL LOW (ref 36.0–46.0)
Hemoglobin: 8.4 g/dL — ABNORMAL LOW (ref 12.0–15.0)
MCH: 24.3 pg — ABNORMAL LOW (ref 26.0–34.0)
MCHC: 30.4 g/dL (ref 30.0–36.0)
MCV: 80 fL (ref 80.0–100.0)
Platelets: 389 10*3/uL (ref 150–400)
RBC: 3.45 MIL/uL — ABNORMAL LOW (ref 3.87–5.11)
RDW: 18.5 % — ABNORMAL HIGH (ref 11.5–15.5)
WBC: 9.7 10*3/uL (ref 4.0–10.5)
nRBC: 0.9 % — ABNORMAL HIGH (ref 0.0–0.2)

## 2020-11-28 LAB — COMPREHENSIVE METABOLIC PANEL
ALT: 12 U/L (ref 0–44)
AST: 14 U/L — ABNORMAL LOW (ref 15–41)
Albumin: 3.3 g/dL — ABNORMAL LOW (ref 3.5–5.0)
Alkaline Phosphatase: 42 U/L (ref 38–126)
Anion gap: 9 (ref 5–15)
BUN: 6 mg/dL (ref 6–20)
CO2: 26 mmol/L (ref 22–32)
Calcium: 8.7 mg/dL — ABNORMAL LOW (ref 8.9–10.3)
Chloride: 107 mmol/L (ref 98–111)
Creatinine, Ser: 0.72 mg/dL (ref 0.44–1.00)
GFR, Estimated: >60 mL/min (ref >60)
Glucose, Bld: 105 mg/dL — ABNORMAL HIGH (ref 70–99)
Potassium: 3.5 mmol/L (ref 3.5–5.1)
Sodium: 142 mmol/L (ref 135–145)
Total Bilirubin: 0.5 mg/dL (ref 0.3–1.2)
Total Protein: 6.3 g/dL — ABNORMAL LOW (ref 6.5–8.1)

## 2020-11-28 LAB — GLUCOSE, CAPILLARY
Glucose-Capillary: 77 mg/dL (ref 70–99)
Glucose-Capillary: 85 mg/dL (ref 70–99)
Glucose-Capillary: 74 mg/dL (ref 70–99)
Glucose-Capillary: 60 mg/dL — ABNORMAL LOW (ref 70–99)
Glucose-Capillary: 88 mg/dL (ref 70–99)
Glucose-Capillary: 89 mg/dL (ref 70–99)
Glucose-Capillary: 109 mg/dL — ABNORMAL HIGH (ref 70–99)

## 2020-11-28 SURGERY — ESOPHAGOGASTRODUODENOSCOPY (EGD) WITH PROPOFOL
Anesthesia: Monitor Anesthesia Care

## 2020-11-28 MED ORDER — DEXTROSE 50 % IV SOLN
INTRAVENOUS | Status: AC
  Filled 2020-11-28: qty 50

## 2020-11-28 MED ORDER — DEXTROSE 50 % IV SOLN
25.0000 mL | Freq: Once | INTRAVENOUS | Status: AC
  Administered 2020-11-28: 25 mL via INTRAVENOUS

## 2020-11-28 MED ORDER — LACTATED RINGERS IV SOLN: INTRAVENOUS | Status: DC

## 2020-11-28 MED ORDER — DEXTROSE 5 % IV SOLN: INTRAVENOUS | Status: AC

## 2020-11-28 MED ORDER — PROPOFOL 500 MG/50ML IV EMUL
INTRAVENOUS | Status: DC | PRN
  Administered 2020-11-28: 140 ug/kg/min via INTRAVENOUS

## 2020-11-28 MED ORDER — PROPOFOL 500 MG/50ML IV EMUL
INTRAVENOUS | Status: AC
  Filled 2020-11-28: qty 50

## 2020-11-28 SURGICAL SUPPLY — 16 items
BLOCK BITE 60FR ADLT L/F BLUE (MISCELLANEOUS) ×2 IMPLANT
ELECT REM PT RETURN 9FT ADLT (ELECTROSURGICAL)
ELECTRODE REM PT RTRN 9FT ADLT (ELECTROSURGICAL) IMPLANT
FORCEP RJ3 GP 1.8X160 W-NEEDLE (CUTTING FORCEPS) IMPLANT
FORCEPS BIOP RAD 4 LRG CAP 4 (CUTTING FORCEPS) IMPLANT
NDL SCLEROTHERAPY 25GX240 (NEEDLE) IMPLANT
NEEDLE SCLEROTHERAPY 25GX240 (NEEDLE) IMPLANT
PROBE APC STR FIRE (PROBE) IMPLANT
PROBE INJECTION GOLD (MISCELLANEOUS)
PROBE INJECTION GOLD 7FR (MISCELLANEOUS) IMPLANT
SNARE SHORT THROW 13M SML OVAL (MISCELLANEOUS) IMPLANT
SYR 50ML LL SCALE MARK (SYRINGE) IMPLANT
TOWEL COTTON PACK 4EA (MISCELLANEOUS) ×4 IMPLANT
TUBING ENDO SMARTCAP PENTAX (MISCELLANEOUS) ×4 IMPLANT
TUBING IRRIGATION ENDOGATOR (MISCELLANEOUS) ×2 IMPLANT
WATER STERILE IRR 1000ML POUR (IV SOLUTION) IMPLANT

## 2020-11-28 NOTE — Progress Notes (Signed)
Pt had a bloody, mostly watery stool this morning.

## 2020-11-28 NOTE — Transfer of Care (Signed)
Immediate Anesthesia Transfer of Care Note  Patient: Carol Mueller  Procedure(s) Performed: ESOPHAGOGASTRODUODENOSCOPY (EGD) WITH PROPOFOL (N/A ) GIVENS CAPSULE STUDY (N/A )  Patient Location: PACU and Endoscopy Unit  Anesthesia Type:MAC  Level of Consciousness: awake, alert , oriented and patient cooperative  Airway & Oxygen Therapy: Patient Spontanous Breathing and Patient connected to face mask oxygen  Post-op Assessment: Report given to RN, Post -op Vital signs reviewed and stable and Patient moving all extremities  Post vital signs: Reviewed and stable  Last Vitals:  Vitals Value Taken Time  BP    Temp    Pulse 83 11/28/20 1502  Resp 20 11/28/20 1502  SpO2 100 % 11/28/20 1502  Vitals shown include unvalidated device data.  Last Pain:  Vitals:   11/28/20 1336  TempSrc: Temporal  PainSc: 0-No pain      Patients Stated Pain Goal: 3 (01/41/03 0131)  Complications: No complications documented.

## 2020-11-28 NOTE — Progress Notes (Signed)
  Hypoglycemic Event  CBG: 60  Treatment: D50 25 mL (12.5 gm)  Symptoms: None  Follow-up CBG: Time:1546 CBG Result:88  Possible Reasons for Event: Inadequate meal intake  Comments/MD notified: Dr. Lanetta Inch notified. Order set followed.     Carol Mueller

## 2020-11-28 NOTE — Progress Notes (Signed)
Patient received back to unit in room in stable condition. Patient assisted back to chair in room and assessment remains the same from previous assessment this am. Call bell within reach.

## 2020-11-28 NOTE — Interval H&P Note (Signed)
History and Physical Interval Note:  11/28/2020 1:49 PM  Carol Mueller  has presented today for surgery, with the diagnosis of GI bleed, anemia.  The various methods of treatment have been discussed with the patient and family. After consideration of risks, benefits and other options for treatment, the patient has consented to  Procedure(s): ESOPHAGOGASTRODUODENOSCOPY (EGD) WITH PROPOFOL (N/A) GIVENS CAPSULE STUDY (N/A) as a surgical intervention.  The patient's history has been reviewed, patient examined, no change in status, stable for surgery.  I have reviewed the patient's chart and labs.  Questions were answered to the patient's satisfaction.     Dominic Pea Cirigliano

## 2020-11-28 NOTE — Op Note (Signed)
[facility] Patient Name: Carol Mueller Procedure Date: 11/28/2020 MRN: [id number] Attending MD: Gerrit Heck , MD Date of Birth: 1961/08/26 CSN: [id number] Age: 60 Admit Type: Inpatient Procedure:                Upper GI endoscopy Indications:              Acute post hemorrhagic anemia, Hematochezia,                            Obscure overt GI bleeding Providers:                Gerrit Heck, MD, Cleda Daub, RN, Elspeth Cho Tech., Technician, Courtney Heys. [place], CRNA Referring MD:              Medicines:                Monitored Anesthesia Care Complications:            No immediate complications. Estimated Blood Loss:     Estimated blood loss: none. Procedure:                Pre-Anesthesia Assessment:                           - Prior to the procedure, a History and Physical                            was performed, and patient medications and                            allergies were reviewed. The patient's tolerance of                            previous anesthesia was also reviewed. The risks                            and benefits of the procedure and the sedation                            options and risks were discussed with the patient.                            All questions were answered, and informed consent                            was obtained. Prior Anticoagulants: The patient has                            taken no previous anticoagulant or antiplatelet                            agents. ASA Grade Assessment: III - A patient with  severe systemic disease. After reviewing the risks                            and benefits, the patient was deemed in                            satisfactory condition to undergo the procedure.                           After obtaining informed consent, the endoscope was                            passed under direct vision. Throughout the                             procedure, the patient's blood pressure, pulse, and                            oxygen saturations were monitored continuously. The                            [id number]) Olympus gastroscope was                            introduced through the mouth, and advanced to the                            second part of duodenum. The upper GI endoscopy was                            accomplished without difficulty. The patient                            tolerated the procedure well. 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      The examined esophagus was otherwise normal.      A few sessile polyps with no bleeding and no stigmata of recent bleeding       were found in the gastric fundus and in the gastric body. These were not       removed so not to complicate the VCE reading with post polyectomy bleed.       These were otherwise benign appearing.      The incisura, gastric antrum and pylorus were normal.      The duodenal bulb, first portion of the duodenum and second portion of       the duodenum were normal. Using the endoscope, the video capsule       enteroscope was advanced into the duodenal bulb. The endoscope was then       reinserted and advanced into the 2nd portion of the duodenum, with the       VCE already out of view, consistent with succesful small bowel placement. Impression:               - Ectopic gastric  mucosa in the upper third of the                            esophagus.                           - Normal esophagus.                           - A few gastric polyps.                           - Normal incisura, antrum and pylorus.                           - Normal duodenal bulb, first portion of the                            duodenum and second portion of the duodenum.                           - Successful completion of the Video Capsule                             Enteroscope placement.                           - No specimens collected. Moderate Sedation:      Not Applicable - Patient had care per Anesthesia. Recommendation:           - Return patient to hospital ward for ongoing care.                           - NPO for now per post VCE placement protocol. Ok                            to start clears in 2 hours, then light snack at 4                            hours, and resume previous diet tomorrow AM.                           - Continue present medications.                           - Will download images tomorrow AM after study                            complete with interpretation and additional                            recommendations pending VCE findings.                           - Continue observation for overt bleeding. Procedure Code(s):        ---  Professional ---                           [phone], Esophagogastroduodenoscopy, flexible,                            transoral; diagnostic, including collection of                            specimen(s) by brushing or washing, when performed                            (separate procedure) Diagnosis Code(s):        --- Professional ---                           K31.7, Polyp of stomach and duodenum                           D62, Acute posthemorrhagic anemia                           K92.1, Melena (includes Hematochezia) CPT copyright 2019 American [employer] Association. All rights reserved. The codes documented in this report are preliminary and upon coder review may  be revised to meet current compliance requirements. Gerrit Heck, MD 11/28/2020 2:59:45 PM Number of Addenda: 0

## 2020-11-28 NOTE — Progress Notes (Signed)
[facility] Progress Note  CC:  Recurrent GI bleed, suspect diverticular bleed   Subjective: She remains NPO for EGD, possible VCE this afternoon. No N/V. She completed bowel prep, passed normal Pipe stools x 2 then subsequent BMs were watery diarrhea. She had a small amount of bright red bloody watery diarrhea around 6:45am. No significant abdominal pain. She is concerned about her glucose level of 77 this morning. No signs of hypoglycemia. She received Lantus insulin yesterday at bedtime. Novolog insulin held this am. No CP or SOB.    Objective:  Vital signs in last 24 hours:  Temp:  [98.3 F (36.8 C)-99.4 F (37.4 C)] 98.3 F (36.8 C) (01/13 0550) Pulse Rate:  [69-74] 74 (01/13 0550) Resp:  [16-18] 18 (01/13 0550) BP: (128-154)/(73-84) 128/73 (01/13 0550) SpO2:  [97 %-98 %] 97 % (01/13 0550) Last BM Date: 11/26/20 General:   Alert in NAD. Heart: RRR, no murmur.  Pulm: Breath sounds clear throughout.  Abdomen: Soft, nondistended.  Extremities:  Without edema. Neurologic:  Alert and  oriented x4;  grossly normal neurologically. Psych:  Alert and cooperative. Normal mood and affect.  Intake/Output from previous day: 01/12 0701 - 01/13 0700 In: 849.3 [P.O.:120; I.V.:729.3] Out: -  Intake/Output this shift: No intake/output data recorded.  Lab Results: Recent Labs    11/26/20 1640 11/27/20 0333 11/28/20 0345  WBC  --   --  9.7  HGB 8.4* 8.2* 8.4*  HCT 26.2* 26.3* 27.6*  PLT  --   --  389   BMET Recent Labs    11/27/20 0952 11/28/20 0345  NA 140 142  K 3.5 3.5  CL 105 107  CO2 26 26  GLUCOSE 208* 105*  BUN 6 6  CREATININE 0.71 0.72  CALCIUM 9.0 8.7*   LFT Recent Labs    11/28/20 0345  PROT 6.3*  ALBUMIN 3.3*  AST 14*  ALT 12  ALKPHOS 42  BILITOT 0.5   PT/INR No results for input(s): LABPROT, INR in the last 72 hours. Hepatitis Panel No results for input(s): HEPBSAG, HCVAB, HEPAIGM, HEPBIGM in the last 72 hours.  NM GI Blood  Loss  Result Date: 11/26/2020 CLINICAL DATA:  GI bleed EXAM: NUCLEAR MEDICINE GASTROINTESTINAL BLEEDING SCAN TECHNIQUE: Sequential abdominal images were obtained following intravenous administration of Tc-34m labeled red blood cells. RADIOPHARMACEUTICALS:  22.0 mCi Tc-24m pertechnetate in-vitro labeled red cells. COMPARISON:  CT 11/23/2020 FINDINGS: Imaging was continued for 2 hours. There is no radiotracer accumulation seen to suggest or localize GI bleed. IMPRESSION: No evidence of active GI bleed by nuclear imaging. Electronically Signed   By: Rolm Baptise M.D.   On: 11/26/2020 23:44    Assessment / Plan:  45.  60 year old female with recurrent lower GI bleeding, presumed diverticular. She was admitted to the hospital 1/4/2022withrecurrent lower GI bleedingand anemia. S/ P colonoscopy1/05/2021 identified pandiverticulosis and 2 benign colon polyps were removed without evidence of active bleeding. She remained hemodynamically stable without further hematochezia and she was discharged home 1/8. Shereturned to the hospital a few hours later with recurrent lower GI bleeding.An abdominal/pelvic CT angiogram 1/08/2022showed evidence of diverticulosis without diverticulitis or active GI bleedingtherefore IR interventionwasnot done. Hg 7.5 ->6.9. Transfused 1 unit of PRBCs 1/9->Hg 8.1-> 9.0 -> 8.6 -> 8.1 -> 7.8 ->7.8 -> 8.3 -> 8.4 -> 8.2. Today Hg 8.4. BUN 6. Received Feraheme x 1 on 1/10. She developed 10 out of 10 epigastrici pain 1/12 while eating dinner then passed a solid black stool with burgundy  colored blood on the toilet tissue, 30 minutes later she passed a second black solid stool.  A stat tagged RBC scan was negative. No current abdominal pain. No further black stools, passed a small amount of red bloody water diarrhea this am after completing bowel prep for EGD with possible VCE. -Proceed with EGD this afternoon as scheduled, if unrevealing VCE capsule will be placed   -Further  recommendations to be determined after EGD +/- VCE completed   2. Epigastric pain. Normal EGD 07/16/2015.  -See plan in # 1 -Continue Pantoprazole 40mg  po QD   3. History of Cushing'sSyndrome   4. DM II. Glucose level 77 this am. Novolog insulin held this am.  -IVF D5W at 50cc/hr ordered by Dr. Olevia Bowens   Principal Problem:   Diverticulosis of colon with hemorrhage Active Problems:   Essential hypertension   Hypokalemia   Insulin-requiring or dependent type II diabetes mellitus ([place])   Anemia, blood loss   Melena     LOS: 4 days   Carol Mueller  11/28/2020, 7:44 AM

## 2020-11-28 NOTE — Progress Notes (Signed)
[facility] PROGRESS NOTE    Progress Note  Carol Mueller  VOZ:[id number] DOB: [birth year] DOA: 11/23/2020 PCP: [name], DO     Brief Narrative:   Carol Mueller is an 60 y.o. female past medical history of diabetes mellitus type 2, essential hypertension, Cushing syndrome history of recurrent diverticulosis with rectal bleeding discharged from the hospital on 11/23/2020 return on that evening due to having recurrent bleeding. She has had a history of a colonoscopy in 2022 that did not show diverticular bleed. Her bleeding in the past has been presumed it is diverticular in nature. CT angio of the abdomen and pelvis was done that showed no acute bleedings. She was supposed to be discharged on a 11/26/2020, but she started having dark black tarry stool.  Assessment/Plan:   Diverticulosis of colon with hemorrhage CTA of the abdomen and pelvis done on this admission did not show source of bleeding. Surgery has been consulted who offered subtotal colectomy however the patient refused. GI was consulted and she had no signs of bleeding so they did not recommend any intervention. Bleeding scan has been done that showed no source of bleeding. Her hemoglobin has remained stable. We are recommended EGD and if negative capsule endoscopy today, patient is currently n.p.o. no further bleeding.  Acute blood loss anemia: Hemoglobin on admission was 6.9 she was transfused 1 unit of packed red blood cells and was given IV iron. Hemoglobin has remained stable over the last 24 hours.  CBC tomorrow morning.  Electrolyte imbalance hypomagnesemia: Was repleted now resolved.  Diabetes mellitus type 2: Continue Lantus plus sliding scale holding oral hypoglycemic agents. Blood glucose 70s today, she is started on the 5 or the next 8 hours until she is able to be allowed a diet.  Essential hypertension: No changes made to her medication continue Coreg and irbesartan.  DVT prophylaxis:  none Family Communication:none Status is: Inpatient  Remains inpatient appropriate because:Hemodynamically unstable   Dispo: The patient is from: Home              Anticipated d/c is to: Home              Anticipated d/c date is: 1 day              Patient currently is medically stable to d/c.        Code Status:     Code Status Orders  (From admission, onward)         Start     Ordered   11/24/20 0043  Full code  Continuous        11/24/20 0043        Code Status History    Date Active Date Inactive Code Status Order ID Comments User Context   11/19/2020 1817 11/23/2020 1943 Full Code [id number]  Lavina Hamman, MD ED   10/23/2020 1919 10/26/2020 1536 Full Code [id number]  Rise Patience, MD ED   09/23/2020 1556 09/25/2020 1528 Full Code [id number]  Jonnie Finner, DO Inpatient   02/29/2020 1353 03/03/2020 1515 Full Code [id number]  Tomma Rakers, MD ED   10/02/2019 0938 10/04/2019 2042 Full Code [id number]  Truddie Hidden, MD Inpatient   09/17/2019 1122 09/19/2019 1906 Full Code [id number]  Truddie Hidden, MD Inpatient   05/24/2019 2129 05/29/2019 1759 Full Code [id number]  Etta Quill, DO ED   06/02/2018 2124 06/03/2018 1550 Full Code [id number]  Vianne Bulls, MD ED   11/30/2016 1025 12/02/2016 1720 Full Code  [id number]  Barton Dubois, MD ED   06/05/2016 1646 06/09/2016 1356 Full Code [id number]  Verlee Monte, MD Inpatient   07/15/2015 1509 07/17/2015 1814 Full Code [id number]  Mendel Corning, MD ED   Advance Care Planning Activity        IV Access:    Peripheral IV   Procedures and diagnostic studies:   NM GI Blood Loss  Result Date: 11/26/2020 CLINICAL DATA:  GI bleed EXAM: NUCLEAR MEDICINE GASTROINTESTINAL BLEEDING SCAN TECHNIQUE: Sequential abdominal images were obtained following intravenous administration of Tc-71m labeled red blood cells. RADIOPHARMACEUTICALS:  22.0 mCi Tc-5m pertechnetate in-vitro labeled red cells. COMPARISON:  CT 11/23/2020  FINDINGS: Imaging was continued for 2 hours. There is no radiotracer accumulation seen to suggest or localize GI bleed. IMPRESSION: No evidence of active GI bleed by nuclear imaging. Electronically Signed   By: Rolm Baptise M.D.   On: 11/26/2020 23:44     Medical Consultants:    None.  Anti-Infectives:   none  Subjective:    Carol Mueller denies any abdominal pain no blood or further bloody bowel movements.  Objective:    Vitals:   11/27/20 1329 11/27/20 1331 11/27/20 2042 11/28/20 0550  BP: (!) 153/75 (!) 154/75 (!) 145/84 128/73  Pulse: 69 69 69 74  Resp: 16 16 16 18   Temp: 99.4 F (37.4 C) 99.1 F (37.3 C) 98.7 F (37.1 C) 98.3 F (36.8 C)  TempSrc: Oral Oral Oral Oral  SpO2: 98% 98% 97% 97%  Weight:      Height:       SpO2: 97 %   Intake/Output Summary (Last 24 hours) at 11/28/2020 1129 Last data filed at 11/28/2020 0900 Gross per 24 hour  Intake 186.73 ml  Output --  Net 186.73 ml   Filed Weights   11/23/20 1954  Weight: 110.7 kg    Exam: General exam: In no acute distress. Respiratory system: Good air movement and clear to auscultation. Cardiovascular system: S1 & S2 heard, RRR. No JVD. Gastrointestinal system: Abdomen is nondistended, soft and nontender.  Extremities: No pedal edema. Skin: No rashes, lesions or ulcers Psychiatry: Judgement and insight appear normal. Mood & affect appropriate.   Data Reviewed:    Labs: Basic Metabolic Panel: Recent Labs  Lab 11/22/20 0038 11/22/20 2357 11/24/20 0400 11/25/20 0333 11/27/20 0952 11/28/20 0345  NA 136 136 140  --  140 142  K 4.0 3.3* 4.0  --  3.5 3.5  CL 106 104 107  --  105 107  CO2 20* 24 26  --  26 26  GLUCOSE 192* 135* 217*  --  208* 105*  BUN 13 <5* 9  --  6 6  CREATININE 0.86 0.76 0.86  --  0.71 0.72  CALCIUM 8.6* 8.4* 8.3*  --  9.0 8.7*  MG 1.7 1.6*  --  1.8  --   --    GFR Estimated Creatinine Clearance: 87.7 mL/min (by C-G formula based on SCr of 0.72 mg/dL). Liver  Function Tests: Recent Labs  Lab 11/28/20 0345  AST 14*  ALT 12  ALKPHOS 42  BILITOT 0.5  PROT 6.3*  ALBUMIN 3.3*   No results for input(s): LIPASE, AMYLASE in the last 168 hours. No results for input(s): AMMONIA in the last 168 hours. Coagulation profile No results for input(s): INR, PROTIME in the last 168 hours. COVID-19 Labs  No results for input(s): DDIMER, FERRITIN, LDH, CRP in the last 72 hours.  Lab Results  Component Value Date  [place] NEGATIVE 11/24/2020   [place] NEGATIVE 11/19/2020   [place] NEGATIVE 10/23/2020   [place] NEGATIVE 09/23/2020    CBC: Recent Labs  Lab 11/23/20 2018 11/24/20 0120 11/24/20 0400 11/24/20 1125 11/25/20 0333 11/25/20 1000 11/26/20 0336 11/26/20 1102 11/26/20 1640 11/27/20 0333 11/28/20 0345  WBC 11.4*  --  10.1  --  8.7  --   --   --   --   --  9.7  HGB 8.4*   < > 7.3*   < > 8.1*   < > 7.8* 8.3* 8.4* 8.2* 8.4*  HCT 27.3*   < > 24.1*   < > 25.6*   < > 24.7* 26.5* 26.2* 26.3* 27.6*  MCV 78.0*  --  78.0*  --  78.8*  --   --   --   --   --  80.0  PLT 252  --  235  --  266  --   --   --   --   --  389   < > = values in this interval not displayed.   Cardiac Enzymes: No results for input(s): CKTOTAL, CKMB, CKMBINDEX, TROPONINI in the last 168 hours. BNP (last 3 results) No results for input(s): PROBNP in the last 8760 hours. CBG: Recent Labs  Lab 11/27/20 0734 11/27/20 1148 11/27/20 1646 11/27/20 2045 11/28/20 0751  GLUCAP 126* 164* 133* 140* 77   D-Dimer: No results for input(s): DDIMER in the last 72 hours. Hgb A1c: No results for input(s): HGBA1C in the last 72 hours. Lipid Profile: No results for input(s): CHOL, HDL, LDLCALC, TRIG, CHOLHDL, LDLDIRECT in the last 72 hours. Thyroid function studies: No results for input(s): TSH, T4TOTAL, T3FREE, THYROIDAB in the last 72 hours.  Invalid input(s): FREET3 Anemia work up: No results for input(s): VITAMINB12, FOLATE, FERRITIN, TIBC, IRON,  RETICCTPCT in the last 72 hours. Sepsis Labs: Recent Labs  Lab 11/23/20 2018 11/24/20 0400 11/25/20 0333 11/28/20 0345  WBC 11.4* 10.1 8.7 9.7   Microbiology Recent Results (from the past 240 hour(s))  Resp Panel by RT-PCR (Flu A&B, Covid) Nasopharyngeal Swab     Status: None   Collection Time: 11/19/20  4:58 PM   Specimen: Nasopharyngeal Swab; Nasopharyngeal(NP) swabs in vial transport medium  Result Value Ref Range Status   SARS Coronavirus 2 by RT PCR NEGATIVE NEGATIVE Final    Comment: (NOTE) SARS-CoV-2 target nucleic acids are NOT DETECTED.  The SARS-CoV-2 RNA is generally detectable in upper respiratory specimens during the acute phase of infection. The lowest concentration of SARS-CoV-2 viral copies this assay can detect is 138 copies/mL. A negative result does not preclude SARS-Cov-2 infection and should not be used as the sole basis for treatment or other patient management decisions. A negative result may occur with  improper specimen collection/handling, submission of specimen other than nasopharyngeal swab, presence of viral mutation(s) within the areas targeted by this assay, and inadequate number of viral copies(<138 copies/mL). A negative result must be combined with clinical observations, patient history, and epidemiological information. The expected result is Negative.  Fact Sheet for Patients:  EntrepreneurPulse.com.au  Fact Sheet for Healthcare Providers:  IncredibleEmployment.be  This test is no t yet approved or cleared by the [place] FDA and  has been authorized for detection and/or diagnosis of SARS-CoV-2 by FDA under an Emergency Use Authorization (EUA). This EUA will remain  in effect (meaning this test can be used) for the duration of the COVID-19 declaration under Section 564(b)(1) of the Act, 21 U.S.C.section 360bbb-3(b)(1), unless  the authorization is terminated  or revoked sooner.       Influenza A  by PCR NEGATIVE NEGATIVE Final   Influenza B by PCR NEGATIVE NEGATIVE Final    Comment: (NOTE) The Xpert Xpress SARS-CoV-2/FLU/RSV plus assay is intended as an aid in the diagnosis of influenza from Nasopharyngeal swab specimens and should not be used as a sole basis for treatment. Nasal washings and aspirates are unacceptable for Xpert Xpress SARS-CoV-2/FLU/RSV testing.  Fact Sheet for Patients: https://www.fda.gov/media/152166/download  Fact Sheet for Healthcare Providers: SeriousBroker.ithttps://www.fda.gov/media/152162/download  This test is not yet approved or cleared by the [place] FDA and has been authorized fBloggerCourse.comor detection and/or diagnosis of SARS-CoV-2 by FDA under an Emergency Use Authorization (EUA). This EUA will remain in effect (meaning this test can be used) for the duration of the COVID-19 declaration under Section 564(b)(1) of the Act, 21 U.S.C. section 360bbb-3(b)(1), unless the authorization is terminated or revoked.  Performed at [facility], [address] [place], [place] [address]   Resp Panel by RT-PCR (Flu A&B, Covid) Nasopharyngeal Swab     Status: None   Collection Time: 11/24/20 12:55 AM   Specimen: Nasopharyngeal Swab; Nasopharyngeal(NP) swabs in vial transport medium  Result Value Ref Range Status   SARS Coronavirus 2 by RT PCR NEGATIVE NEGATIVE Final    Comment: (NOTE) SARS-CoV-2 target nucleic acids are NOT DETECTED.  The SARS-CoV-2 RNA is generally detectable in upper respiratory specimens during the acute phase of infection. The lowest concentration of SARS-CoV-2 viral copies this assay can detect is 138 copies/mL. A negative result does not preclude SARS-Cov-2 infection and should not be used as the sole basis for treatment or other patient management decisions. A negative result may occur with  improper specimen collection/handling, submission of specimen other than nasopharyngeal swab, presence of viral mutation(s) within  the areas targeted by this assay, and inadequate number of viral copies(<138 copies/mL). A negative result must be combined with clinical observations, patient history, and epidemiological information. The expected result is Negative.  Fact Sheet for Patients:  BloggerCourse.comhttps://www.fda.gov/media/152166/download  Fact Sheet for Healthcare Providers:  SeriousBroker.ithttps://www.fda.gov/media/152162/download  This test is no t yet approved or cleared by the [place] FDA and  has been authorized for detection and/or diagnosis of SARS-CoV-2 by FDA under an Emergency Use Authorization (EUA). This EUA will remain  in effect (meaning this test can be used) for the duration of the COVID-19 declaration under Section 564(b)(1) of the Act, 21 U.S.C.section 360bbb-3(b)(1), unless the authorization is terminated  or revoked sooner.       Influenza A by PCR NEGATIVE NEGATIVE Final   Influenza B by PCR NEGATIVE NEGATIVE Final    Comment: (NOTE) The Xpert Xpress SARS-CoV-2/FLU/RSV plus assay is intended as an aid in the diagnosis of influenza from Nasopharyngeal swab specimens and should not be used as a sole basis for treatment. Nasal washings and aspirates are unacceptable for Xpert Xpress SARS-CoV-2/FLU/RSV testing.  Fact Sheet for Patients: BloggerCourse.comhttps://www.fda.gov/media/152166/download  Fact Sheet for Healthcare Providers: SeriousBroker.ithttps://www.fda.gov/media/152162/download  This test is not yet approved or cleared by the [place] FDA and has been authorized for detection and/or diagnosis of SARS-CoV-2 by FDA under an Emergency Use Authorization (EUA). This EUA will remain in effect (meaning this test can be used) for the duration of the COVID-19 declaration under Section 564(b)(1) of the Act, 21 U.S.C. section 360bbb-3(b)(1), unless the authorization is terminated or revoked.  Performed at [facility], [address] [place], [place] [address]  Medications:   . atorvastatin  10  mg Oral Daily  . carvedilol  25 mg Oral BID  . insulin aspart  0-15 Units Subcutaneous TID WC  . insulin aspart  0-5 Units Subcutaneous QHS  . insulin glargine  45 Units Subcutaneous QHS  . irbesartan  300 mg Oral Daily  . pantoprazole  40 mg Oral Daily  . potassium chloride SA  20 mEq Oral Daily   Continuous Infusions: . sodium chloride 20 mL/hr at 11/27/20 2334  . dextrose 50 mL/hr at 11/28/20 0850  . lactated ringers 100 mL/hr at 11/26/20 0649      LOS: 4 days   Charlynne Cousins  [facility]  11/28/2020, 11:29 AM

## 2020-11-28 NOTE — H&P (View-Only) (Signed)
[facility] Progress Note  CC:  Recurrent GI bleed, suspect diverticular bleed   Subjective: She remains NPO for EGD, possible VCE this afternoon. No N/V. She completed bowel prep, passed normal Pipe stools x 2 then subsequent BMs were watery diarrhea. She had a small amount of bright red bloody watery diarrhea around 6:45am. No significant abdominal pain. She is concerned about her glucose level of 77 this morning. No signs of hypoglycemia. She received Lantus insulin yesterday at bedtime. Novolog insulin held this am. No CP or SOB.    Objective:  Vital signs in last 24 hours:  Temp:  [98.3 F (36.8 C)-99.4 F (37.4 C)] 98.3 F (36.8 C) (01/13 0550) Pulse Rate:  [69-74] 74 (01/13 0550) Resp:  [16-18] 18 (01/13 0550) BP: (128-154)/(73-84) 128/73 (01/13 0550) SpO2:  [97 %-98 %] 97 % (01/13 0550) Last BM Date: 11/26/20 General:   Alert in NAD. Heart: RRR, no murmur.  Pulm: Breath sounds clear throughout.  Abdomen: Soft, nondistended.  Extremities:  Without edema. Neurologic:  Alert and  oriented x4;  grossly normal neurologically. Psych:  Alert and cooperative. Normal mood and affect.  Intake/Output from previous day: 01/12 0701 - 01/13 0700 In: 849.3 [P.O.:120; I.V.:729.3] Out: -  Intake/Output this shift: No intake/output data recorded.  Lab Results: Recent Labs    11/26/20 1640 11/27/20 0333 11/28/20 0345  WBC  --   --  9.7  HGB 8.4* 8.2* 8.4*  HCT 26.2* 26.3* 27.6*  PLT  --   --  389   BMET Recent Labs    11/27/20 0952 11/28/20 0345  NA 140 142  K 3.5 3.5  CL 105 107  CO2 26 26  GLUCOSE 208* 105*  BUN 6 6  CREATININE 0.71 0.72  CALCIUM 9.0 8.7*   LFT Recent Labs    11/28/20 0345  PROT 6.3*  ALBUMIN 3.3*  AST 14*  ALT 12  ALKPHOS 42  BILITOT 0.5   PT/INR No results for input(s): LABPROT, INR in the last 72 hours. Hepatitis Panel No results for input(s): HEPBSAG, HCVAB, HEPAIGM, HEPBIGM in the last 72 hours.  NM GI Blood  Loss  Result Date: 11/26/2020 CLINICAL DATA:  GI bleed EXAM: NUCLEAR MEDICINE GASTROINTESTINAL BLEEDING SCAN TECHNIQUE: Sequential abdominal images were obtained following intravenous administration of Tc-34m labeled red blood cells. RADIOPHARMACEUTICALS:  22.0 mCi Tc-24m pertechnetate in-vitro labeled red cells. COMPARISON:  CT 11/23/2020 FINDINGS: Imaging was continued for 2 hours. There is no radiotracer accumulation seen to suggest or localize GI bleed. IMPRESSION: No evidence of active GI bleed by nuclear imaging. Electronically Signed   By: Rolm Baptise M.D.   On: 11/26/2020 23:44    Assessment / Plan:  45.  60 year old female with recurrent lower GI bleeding, presumed diverticular. She was admitted to the hospital 1/4/2022withrecurrent lower GI bleedingand anemia. S/ P colonoscopy1/05/2021 identified pandiverticulosis and 2 benign colon polyps were removed without evidence of active bleeding. She remained hemodynamically stable without further hematochezia and she was discharged home 1/8. Shereturned to the hospital a few hours later with recurrent lower GI bleeding.An abdominal/pelvic CT angiogram 1/08/2022showed evidence of diverticulosis without diverticulitis or active GI bleedingtherefore IR interventionwasnot done. Hg 7.5 ->6.9. Transfused 1 unit of PRBCs 1/9->Hg 8.1-> 9.0 -> 8.6 -> 8.1 -> 7.8 ->7.8 -> 8.3 -> 8.4 -> 8.2. Today Hg 8.4. BUN 6. Received Feraheme x 1 on 1/10. She developed 10 out of 10 epigastrici pain 1/12 while eating dinner then passed a solid black stool with burgundy  colored blood on the toilet tissue, 30 minutes later she passed a second black solid stool.  A stat tagged RBC scan was negative. No current abdominal pain. No further black stools, passed a small amount of red bloody water diarrhea this am after completing bowel prep for EGD with possible VCE. -Proceed with EGD this afternoon as scheduled, if unrevealing VCE capsule will be placed   -Further  recommendations to be determined after EGD +/- VCE completed   2. Epigastric pain. Normal EGD 07/16/2015.  -See plan in # 1 -Continue Pantoprazole 40mg po QD   3. History of Cushing'sSyndrome   4. DM II. Glucose level 77 this am. Novolog insulin held this am.  -IVF D5W at 50cc/hr ordered by Dr. Ortiz   Principal Problem:   Diverticulosis of colon with hemorrhage Active Problems:   Essential hypertension   Hypokalemia   Insulin-requiring or dependent type II diabetes mellitus ([facility])   Anemia, blood loss   Melena     LOS: 4 days   Sayan Aldava M Kennedy-Smith  11/28/2020, 7:44 AM    

## 2020-11-28 NOTE — Plan of Care (Signed)
  Problem: Education: Goal: Knowledge of General Education information will improve Description: Including pain rating scale, medication(s)/side effects and non-pharmacologic comfort measures Outcome: Progressing   Problem: Health Behavior/Discharge Planning: Goal: Ability to manage health-related needs will improve Outcome: Progressing   Problem: Clinical Measurements: Goal: Ability to maintain clinical measurements within normal limits will improve Outcome: Progressing Goal: Will remain free from infection Outcome: Progressing Goal: Diagnostic test results will improve Outcome: Progressing Goal: Respiratory complications will improve Outcome: Progressing Goal: Cardiovascular complication will be avoided Outcome: Progressing   Problem: Activity: Goal: Risk for activity intolerance will decrease Outcome: Progressing   Problem: Nutrition: Goal: Adequate nutrition will be maintained Outcome: Progressing   Problem: Coping: Goal: Level of anxiety will decrease Outcome: Progressing   Problem: Pain Managment: Goal: General experience of comfort will improve Outcome: Progressing   Problem: Elimination: Goal: Will not experience complications related to bowel motility Outcome: Progressing Goal: Will not experience complications related to urinary retention Outcome: Progressing   Problem: Safety: Goal: Ability to remain free from injury will improve Outcome: Progressing   Problem: Skin Integrity: Goal: Risk for impaired skin integrity will decrease Outcome: Progressing   Problem: Education: Goal: Ability to identify signs and symptoms of gastrointestinal bleeding will improve Outcome: Progressing   Problem: Fluid Volume: Goal: Will show no signs and symptoms of excessive bleeding Outcome: Progressing   Problem: Bowel/Gastric: Goal: Will show no signs and symptoms of gastrointestinal bleeding Outcome: Progressing   Problem: Clinical Measurements: Goal:  Complications related to the disease process, condition or treatment will be avoided or minimized Outcome: Progressing

## 2020-11-28 NOTE — Anesthesia Preprocedure Evaluation (Addendum)
Anesthesia Evaluation  Patient identified by MRN, date of birth, ID band Patient awake    Reviewed: Allergy & Precautions, NPO status , Patient's Chart, lab work & pertinent test results, reviewed documented beta blocker date and time   Airway Mallampati: III  TM Distance: >3 FB Neck ROM: Full    Dental no notable dental hx. (+) Chipped, Dental Advisory Given,    Pulmonary neg pulmonary ROS,    Pulmonary exam normal breath sounds clear to auscultation       Cardiovascular hypertension, Pt. on home beta blockers and Pt. on medications Normal cardiovascular exam Rhythm:Regular Rate:Normal     Neuro/Psych negative neurological ROS  negative psych ROS   GI/Hepatic Neg liver ROS, GERD  Medicated and Controlled,  Endo/Other  diabetes, Oral Hypoglycemic Agents, Insulin DependentMorbid obesity (BMI 45)Cushing syndrome   Renal/GU negative Renal ROS  negative genitourinary   Musculoskeletal negative musculoskeletal ROS (+)   Abdominal   Peds  Hematology  (+) Blood dyscrasia (Hgb 8.4), anemia ,   Anesthesia Other Findings EGD for GIB, anemia history of recurrent diverticulosis with rectal bleeding  Reproductive/Obstetrics                           Anesthesia Physical Anesthesia Plan  ASA: III  Anesthesia Plan: MAC   Post-op Pain Management:    Induction: Intravenous  PONV Risk Score and Plan: 2 and Propofol infusion and Treatment may vary due to age or medical condition  Airway Management Planned: Natural Airway  Additional Equipment:   Intra-op Plan:   Post-operative Plan:   Informed Consent: I have reviewed the patients History and Physical, chart, labs and discussed the procedure including the risks, benefits and alternatives for the proposed anesthesia with the patient or authorized representative who has indicated his/her understanding and acceptance.     Dental advisory  given  Plan Discussed with: CRNA  Anesthesia Plan Comments:         Anesthesia Quick Evaluation

## 2020-11-28 NOTE — Progress Notes (Signed)
Notified Dr Olevia Bowens that pts blood sugar is 77, no new orders just hold all insulin.

## 2020-11-29 LAB — BASIC METABOLIC PANEL
Anion gap: 8 (ref 5–15)
BUN: 5 mg/dL — ABNORMAL LOW (ref 6–20)
CO2: 28 mmol/L (ref 22–32)
Calcium: 8.5 mg/dL — ABNORMAL LOW (ref 8.9–10.3)
Chloride: 104 mmol/L (ref 98–111)
Creatinine, Ser: 0.7 mg/dL (ref 0.44–1.00)
GFR, Estimated: >60 mL/min (ref >60)
Glucose, Bld: 149 mg/dL — ABNORMAL HIGH (ref 70–99)
Potassium: 3.3 mmol/L — ABNORMAL LOW (ref 3.5–5.1)
Sodium: 140 mmol/L (ref 135–145)

## 2020-11-29 LAB — GLUCOSE, CAPILLARY: Glucose-Capillary: 111 mg/dL — ABNORMAL HIGH (ref 70–99)

## 2020-11-29 LAB — HEMOGLOBIN AND HEMATOCRIT, BLOOD
HCT: 27.8 % — ABNORMAL LOW (ref 36.0–46.0)
Hemoglobin: 8.4 g/dL — ABNORMAL LOW (ref 12.0–15.0)

## 2020-11-29 MED ORDER — POTASSIUM CHLORIDE CRYS ER 20 MEQ PO TBCR
40.0000 meq | EXTENDED_RELEASE_TABLET | Freq: Three times a day (TID) | ORAL | Status: DC
  Administered 2020-11-29: 40 meq via ORAL
  Filled 2020-11-29: qty 2

## 2020-11-29 NOTE — Plan of Care (Signed)
  Problem: Education: Goal: Knowledge of General Education information will improve Description: Including pain rating scale, medication(s)/side effects and non-pharmacologic comfort measures Outcome: Adequate for Discharge   Problem: Health Behavior/Discharge Planning: Goal: Ability to manage health-related needs will improve Outcome: Adequate for Discharge   Problem: Clinical Measurements: Goal: Ability to maintain clinical measurements within normal limits will improve Outcome: Adequate for Discharge Goal: Will remain free from infection Outcome: Adequate for Discharge Goal: Diagnostic test results will improve Outcome: Adequate for Discharge Goal: Respiratory complications will improve Outcome: Adequate for Discharge Goal: Cardiovascular complication will be avoided Outcome: Adequate for Discharge   Problem: Nutrition: Goal: Adequate nutrition will be maintained Outcome: Adequate for Discharge   Problem: Coping: Goal: Level of anxiety will decrease Outcome: Adequate for Discharge   Problem: Elimination: Goal: Will not experience complications related to bowel motility Outcome: Adequate for Discharge   Problem: Pain Managment: Goal: General experience of comfort will improve Outcome: Adequate for Discharge   Problem: Education: Goal: Ability to identify signs and symptoms of gastrointestinal bleeding will improve Outcome: Adequate for Discharge   Problem: Bowel/Gastric: Goal: Will show no signs and symptoms of gastrointestinal bleeding Outcome: Adequate for Discharge   Problem: Fluid Volume: Goal: Will show no signs and symptoms of excessive bleeding Outcome: Adequate for Discharge   Problem: Clinical Measurements: Goal: Complications related to the disease process, condition or treatment will be avoided or minimized Outcome: Adequate for Discharge

## 2020-11-29 NOTE — Plan of Care (Signed)
Plan of care reviewed and discussed with the patient. 

## 2020-11-29 NOTE — Progress Notes (Signed)
[facility] PROGRESS NOTE    Progress Note  Carol Mueller  PFX:[id number] DOB: 09-15-1961 DOA: 11/23/2020 PCP: [name], DO     Brief Narrative:   Carol Mueller is an 60 y.o. female past medical history of diabetes mellitus type 2, essential hypertension, Cushing syndrome history of recurrent diverticulosis with rectal bleeding discharged from the hospital on 11/23/2020 return on that evening due to having recurrent bleeding. She has had a history of a colonoscopy in 2022 that did not show diverticular bleed. Her bleeding in the past has been presumed it is diverticular in nature. CT angio of the abdomen and pelvis was done that showed no acute bleedings. She was supposed to be discharged on a 11/26/2020, but she started having dark black tarry stool.  Assessment/Plan:   Diverticulosis of colon with hemorrhage CTA of the abdomen and pelvis done on this admission did not show source of bleeding. Surgery has been consulted who offered subtotal colectomy however the patient refused. GI was consulted she is status post EGD that showed no source of bleeding. Capsule endoscopy results are pending. Her hemoglobin has remained stable.  Acute blood loss anemia: Hemoglobin on admission was 6.9 she was transfused 1 unit of packed red blood cells and was given IV iron. Hemoglobin is stable continue iron sulfate as an outpatient.  Electrolyte imbalance hypomagnesemia: Was repleted now resolved.  Diabetes mellitus type 2: Blood glucose well controlled continue long-acting insulin plus sliding scale.  Essential hypertension: No changes made to her medication continue Coreg and irbesartan.  DVT prophylaxis: none Family Communication:none Status is: Inpatient  Remains inpatient appropriate because:Hemodynamically unstable   Dispo: The patient is from: Home              Anticipated d/c is to: Home              Anticipated d/c date is: 1 day              Patient currently is  medically stable to d/c.        Code Status:     Code Status Orders  (From admission, onward)         Start     Ordered   11/24/20 0043  Full code  Continuous        11/24/20 0043        Code Status History    Date Active Date Inactive Code Status Order ID Comments User Context   11/19/2020 1817 11/23/2020 1943 Full Code [id number]  Lavina Hamman, MD ED   10/23/2020 1919 10/26/2020 1536 Full Code [id number]  Rise Patience, MD ED   09/23/2020 1556 09/25/2020 1528 Full Code [id number]  Jonnie Finner, DO Inpatient   02/29/2020 1353 03/03/2020 1515 Full Code [id number]  Tomma Rakers, MD ED   10/02/2019 0938 10/04/2019 2042 Full Code [id number]  Truddie Hidden, MD Inpatient   09/17/2019 1122 09/19/2019 1906 Full Code [id number]  Truddie Hidden, MD Inpatient   05/24/2019 2129 05/29/2019 1759 Full Code [id number]  Etta Quill, DO ED   06/02/2018 2124 06/03/2018 1550 Full Code [id number]  Vianne Bulls, MD ED   11/30/2016 1025 12/02/2016 1720 Full Code [id number]  Barton Dubois, MD ED   06/05/2016 1646 06/09/2016 1356 Full Code [id number]  Verlee Monte, MD Inpatient   07/15/2015 1509 07/17/2015 1814 Full Code [id number]  Mendel Corning, MD ED   Advance Care Planning Activity        IV Access:  Peripheral IV   Procedures and diagnostic studies:   No results found.   Medical Consultants:    None.  Anti-Infectives:   none  Subjective:    Carol Mueller she says she had about half a teaspoon of bloody bowel movement.  Objective:    Vitals:   11/28/20 1530 11/28/20 1540 11/28/20 2051 11/29/20 0459  BP: (!) 145/65 (!) 153/79 134/77 (!) 149/82  Pulse: 77 74 74 72  Resp: 18 (!) 22 18 16   Temp:   99 F (37.2 C) 99.3 F (37.4 C)  TempSrc:   Oral Oral  SpO2: 96% 96% 96% 94%  Weight:      Height:       SpO2: 94 % O2 Flow Rate (L/min): 10 L/min   Intake/Output Summary (Last 24 hours) at 11/29/2020 0859 Last data filed at 11/29/2020 0700 Gross  per 24 hour  Intake 1569.98 ml  Output 0 ml  Net 1569.98 ml   Filed Weights   11/23/20 1954  Weight: 110.7 kg    Exam: General exam: In no acute distress. Respiratory system: Good air movement and clear to auscultation. Cardiovascular system: S1 & S2 heard, RRR. No JVD. Gastrointestinal system: Abdomen is nondistended, soft and nontender.  Extremities: No pedal edema. Skin: No rashes, lesions or ulcers   Data Reviewed:    Labs: Basic Metabolic Panel: Recent Labs  Lab 11/22/20 2357 11/24/20 0400 11/25/20 0333 11/27/20 0952 11/28/20 0345 11/29/20 0322  NA 136 140  --  140 142 140  K 3.3* 4.0  --  3.5 3.5 3.3*  CL 104 107  --  105 107 104  CO2 24 26  --  26 26 28   GLUCOSE 135* 217*  --  208* 105* 149*  BUN <5* 9  --  6 6 5*  CREATININE 0.76 0.86  --  0.71 0.72 0.70  CALCIUM 8.4* 8.3*  --  9.0 8.7* 8.5*  MG 1.6*  --  1.8  --   --   --    GFR Estimated Creatinine Clearance: 87.7 mL/min (by C-G formula based on SCr of 0.7 mg/dL). Liver Function Tests: Recent Labs  Lab 11/28/20 0345  AST 14*  ALT 12  ALKPHOS 42  BILITOT 0.5  PROT 6.3*  ALBUMIN 3.3*   No results for input(s): LIPASE, AMYLASE in the last 168 hours. No results for input(s): AMMONIA in the last 168 hours. Coagulation profile No results for input(s): INR, PROTIME in the last 168 hours. COVID-19 Labs  No results for input(s): DDIMER, FERRITIN, LDH, CRP in the last 72 hours.  Lab Results  Component Value Date   SARSCOV2NAA NEGATIVE 11/24/2020   [place] NEGATIVE 11/19/2020   [place] NEGATIVE 10/23/2020   [place] NEGATIVE 09/23/2020    CBC: Recent Labs  Lab 11/23/20 2018 11/24/20 0120 11/24/20 0400 11/24/20 1125 11/25/20 0333 11/25/20 1000 11/26/20 1102 11/26/20 1640 11/27/20 0333 11/28/20 0345 11/29/20 0322  WBC 11.4*  --  10.1  --  8.7  --   --   --   --  9.7  --   HGB 8.4*   < > 7.3*   < > 8.1*   < > 8.3* 8.4* 8.2* 8.4* 8.4*  HCT 27.3*   < > 24.1*   < > 25.6*   < >  26.5* 26.2* 26.3* 27.6* 27.8*  MCV 78.0*  --  78.0*  --  78.8*  --   --   --   --  80.0  --   PLT  252  --  235  --  266  --   --   --   --  389  --    < > = values in this interval not displayed.   Cardiac Enzymes: No results for input(s): CKTOTAL, CKMB, CKMBINDEX, TROPONINI in the last 168 hours. BNP (last 3 results) No results for input(s): PROBNP in the last 8760 hours. CBG: Recent Labs  Lab 11/28/20 1512 11/28/20 1546 11/28/20 1656 11/28/20 2052 11/29/20 0716  GLUCAP 60* 88 89 109* 111*   D-Dimer: No results for input(s): DDIMER in the last 72 hours. Hgb A1c: No results for input(s): HGBA1C in the last 72 hours. Lipid Profile: No results for input(s): CHOL, HDL, LDLCALC, TRIG, CHOLHDL, LDLDIRECT in the last 72 hours. Thyroid function studies: No results for input(s): TSH, T4TOTAL, T3FREE, THYROIDAB in the last 72 hours.  Invalid input(s): FREET3 Anemia work up: No results for input(s): VITAMINB12, FOLATE, FERRITIN, TIBC, IRON, RETICCTPCT in the last 72 hours. Sepsis Labs: Recent Labs  Lab 11/23/20 2018 11/24/20 0400 11/25/20 0333 11/28/20 0345  WBC 11.4* 10.1 8.7 9.7   Microbiology Recent Results (from the past 240 hour(s))  Resp Panel by RT-PCR (Flu A&B, Covid) Nasopharyngeal Swab     Status: None   Collection Time: 11/19/20  4:58 PM   Specimen: Nasopharyngeal Swab; Nasopharyngeal(NP) swabs in vial transport medium  Result Value Ref Range Status   SARS Coronavirus 2 by RT PCR NEGATIVE NEGATIVE Final    Comment: (NOTE) SARS-CoV-2 target nucleic acids are NOT DETECTED.  The SARS-CoV-2 RNA is generally detectable in upper respiratory specimens during the acute phase of infection. The lowest concentration of SARS-CoV-2 viral copies this assay can detect is 138 copies/mL. A negative result does not preclude SARS-Cov-2 infection and should not be used as the sole basis for treatment or other patient management decisions. A negative result may occur with   improper specimen collection/handling, submission of specimen other than nasopharyngeal swab, presence of viral mutation(s) within the areas targeted by this assay, and inadequate number of viral copies(<138 copies/mL). A negative result must be combined with clinical observations, patient history, and epidemiological information. The expected result is Negative.  Fact Sheet for Patients:  BloggerCourse.comhttps://www.fda.gov/media/152166/download  Fact Sheet for Healthcare Providers:  SeriousBroker.ithttps://www.fda.gov/media/152162/download  This test is no t yet approved or cleared by the [place] FDA and  has been authorized for detection and/or diagnosis of SARS-CoV-2 by FDA under an Emergency Use Authorization (EUA). This EUA will remain  in effect (meaning this test can be used) for the duration of the COVID-19 declaration under Section 564(b)(1) of the Act, 21 U.S.C.section 360bbb-3(b)(1), unless the authorization is terminated  or revoked sooner.       Influenza A by PCR NEGATIVE NEGATIVE Final   Influenza B by PCR NEGATIVE NEGATIVE Final    Comment: (NOTE) The Xpert Xpress SARS-CoV-2/FLU/RSV plus assay is intended as an aid in the diagnosis of influenza from Nasopharyngeal swab specimens and should not be used as a sole basis for treatment. Nasal washings and aspirates are unacceptable for Xpert Xpress SARS-CoV-2/FLU/RSV testing.  Fact Sheet for Patients: BloggerCourse.comhttps://www.fda.gov/media/152166/download  Fact Sheet for Healthcare Providers: SeriousBroker.ithttps://www.fda.gov/media/152162/download  This test is not yet approved or cleared by the [place] FDA and has been authorized for detection and/or diagnosis of SARS-CoV-2 by FDA under an Emergency Use Authorization (EUA). This EUA will remain in effect (meaning this test can be used) for the duration of the COVID-19 declaration under Section 564(b)(1) of the Act, 21  U.S.C. section 360bbb-3(b)(1), unless the authorization is terminated  or revoked.  Performed at [facility], [place] [address], [place], [place] [address]   Resp Panel by RT-PCR (Flu A&B, Covid) Nasopharyngeal Swab     Status: None   Collection Time: 11/24/20 12:55 AM   Specimen: Nasopharyngeal Swab; Nasopharyngeal(NP) swabs in vial transport medium  Result Value Ref Range Status   SARS Coronavirus 2 by RT PCR NEGATIVE NEGATIVE Final    Comment: (NOTE) SARS-CoV-2 target nucleic acids are NOT DETECTED.  The SARS-CoV-2 RNA is generally detectable in upper respiratory specimens during the acute phase of infection. The lowest concentration of SARS-CoV-2 viral copies this assay can detect is 138 copies/mL. A negative result does not preclude SARS-Cov-2 infection and should not be used as the sole basis for treatment or other patient management decisions. A negative result may occur with  improper specimen collection/handling, submission of specimen other than nasopharyngeal swab, presence of viral mutation(s) within the areas targeted by this assay, and inadequate number of viral copies(<138 copies/mL). A negative result must be combined with clinical observations, patient history, and epidemiological information. The expected result is Negative.  Fact Sheet for Patients:  EntrepreneurPulse.com.au  Fact Sheet for Healthcare Providers:  IncredibleEmployment.be  This test is no t yet approved or cleared by the [place] FDA and  has been authorized for detection and/or diagnosis of SARS-CoV-2 by FDA under an Emergency Use Authorization (EUA). This EUA will remain  in effect (meaning this test can be used) for the duration of the COVID-19 declaration under Section 564(b)(1) of the Act, 21 U.S.C.section 360bbb-3(b)(1), unless the authorization is terminated  or revoked sooner.       Influenza A by PCR NEGATIVE NEGATIVE Final   Influenza B by PCR NEGATIVE NEGATIVE Final    Comment: (NOTE) The  Xpert Xpress SARS-CoV-2/FLU/RSV plus assay is intended as an aid in the diagnosis of influenza from Nasopharyngeal swab specimens and should not be used as a sole basis for treatment. Nasal washings and aspirates are unacceptable for Xpert Xpress SARS-CoV-2/FLU/RSV testing.  Fact Sheet for Patients: EntrepreneurPulse.com.au  Fact Sheet for Healthcare Providers: IncredibleEmployment.be  This test is not yet approved or cleared by the [place] FDA and has been authorized for detection and/or diagnosis of SARS-CoV-2 by FDA under an Emergency Use Authorization (EUA). This EUA will remain in effect (meaning this test can be used) for the duration of the COVID-19 declaration under Section 564(b)(1) of the Act, 21 U.S.C. section 360bbb-3(b)(1), unless the authorization is terminated or revoked.  Performed at [facility], [place] [address] [place], [place] [address]      Medications:   . atorvastatin  10 mg Oral Daily  . carvedilol  25 mg Oral BID  . insulin aspart  0-15 Units Subcutaneous TID WC  . insulin aspart  0-5 Units Subcutaneous QHS  . insulin glargine  45 Units Subcutaneous QHS  . irbesartan  300 mg Oral Daily  . pantoprazole  40 mg Oral Daily  . potassium chloride SA  20 mEq Oral Daily   Continuous Infusions: . lactated ringers 100 mL/hr at 11/26/20 0649  . lactated ringers        LOS: 5 days   Charlynne Cousins  [facility]  11/29/2020, 8:59 AM

## 2020-11-29 NOTE — Progress Notes (Signed)
[facility] Progress Note  CC:   Recurrent GI bleed, suspect diverticular bleed   Subjective: No N/V. No upper or lower abdominal pain. She passed a small BM yesterday evening with a small amount of red blood on the stool and on the toilet tissue. She passed a Jury soft to loose stool this morning without any noticeable blood.   Objective:   EGD 11/28/2020:  - Ectopic gastric mucosa in the upper third of the esophagIunsp.atient - Normal esophagus. - A few gastric polyps. - Normal incisura, antrum and pylorus. - Normal duodenal bulb, first portion of the duodenum and second portion of the   duodenum. - Successful completion of the Video Capsule Enteroscope placement. - No specimens collected.   Vital signs in last 24 hours: Temp:  [97.7 F (36.5 C)-99.3 F (37.4 C)] 99.3 F (37.4 C) (01/14 0459) Pulse Rate:  [72-83] 72 (01/14 0459) Resp:  [13-23] 16 (01/14 0459) BP: (114-165)/(44-82) 149/82 (01/14 0459) SpO2:  [94 %-100 %] 94 % (01/14 0459) Last BM Date: 11/26/20 General:   Alert 60 year old female in NAD. Heart: RRR, no murmur.  Pulm:  Breath sounds clear throughout.  Abdomen: Soft, nontender. + BS x 4 quads.  Extremities:  Without edema. Neurologic:  Alert and  oriented x 4;  grossly normal neurologically. Psych:  Alert and cooperative. Normal mood and affect.  Intake/Output from previous day: 01/13 0701 - 01/14 0700 In: 1570 [P.O.:100; I.V.:1470] Out: 0  Intake/Output this shift: No intake/output data recorded.  Lab Results: Recent Labs    11/27/20 0333 11/28/20 0345 11/29/20 0322  WBC  --  9.7  --   HGB 8.2* 8.4* 8.4*  HCT 26.3* 27.6* 27.8*  PLT  --  389  --    BMET Recent Labs    11/27/20 0952 11/28/20 0345 11/29/20 0322  NA 140 142 140  K 3.5 3.5 3.3*  CL 105 107 104  CO2 26 26 28   GLUCOSE 208* 105* 149*  BUN 6 6 5*  CREATININE 0.71 0.72 0.70  CALCIUM 9.0 8.7* 8.5*   LFT Recent Labs    11/28/20 0345  PROT 6.3*   ALBUMIN 3.3*  AST 14*  ALT 12  ALKPHOS 42  BILITOT 0.5   PT/INR No results for input(s): LABPROT, INR in the last 72 hours. Hepatitis Panel No results for input(s): HEPBSAG, HCVAB, HEPAIGM, HEPBIGM in the last 72 hours.  No results found.  Assessment / Plan:  49. 60 year old femalewith recurrent lower GI bleeding, presumed diverticular. She was admitted to the hospital1/4/2022withrecurrentlower GI bleedingand anemia. S/ P colonoscopy1/7/2022identifiedpandiverticulosis and 2benign colonpolyps were removedwithout evidence of active bleeding. She remained hemodynamically stable without further hematochezia and she was discharged home 1/8.Shereturned to the hospital a few hours later with recurrent lower GI bleeding.An abdominal/pelvic CT angiogram 1/08/2022showed evidence of diverticulosis without diverticulitis or active GI bleedingtherefore IR interventionwasnot done. Hg 7.5 ->6.9. Transfused 1 unit of PRBCs 1/9. Hg remains stable at 8.4.  She developed epigastric pain on 1/11.  EGD 1/13 showed a few gastric polyps and ectopic gastric mucosa in the upper third of the esophagus otherwise was normal.  Video capsule was placed, results pending. No N/V or abdominal pain this am. She passed a Zendejas soft to loose stool this morning without any noticeable blood. -Ok to discharge home today -Our office will contact the patient with small bowel capsule endoscopy results, to be read later this afternoon by Dr. Bryan Lemma.  -Repeat CBC in 1 week and follow  up with Dr. [name] or APP in 2 weeks   2. Epigastric pain. -Continue Pantoprazole 40mg  po QD  3. History of Cushing'sSyndrome   4. DM II.   Principal Problem:   Diverticulosis of colon with hemorrhage Active Problems:   Essential hypertension   Hypokalemia   Insulin-requiring or dependent type II diabetes mellitus (HCC)   Anemia, blood loss   Melena   Gastric inlet patch of esophagus   Multiple gastric  polyps     LOS: 5 days   Noralyn Pick  11/29/2020, 10:52 AM

## 2020-11-29 NOTE — Discharge Summary (Signed)
Physician Discharge Summary  Carol Mueller ZOX:[id number] DOB: 1961/03/28 DOA: 11/23/2020  PCP: Carol Morning, DO  Admit date: 11/23/2020 Discharge date: 11/29/2020  Time spent: 50 minutes  Recommendations for Outpatient Follow-up:  1. Follow-up gastroenterology in 2 weeks 2. Follow-up PCP in 1 week  Discharge Diagnoses:  Principal Problem:   Diverticulosis of colon with hemorrhage Active Problems:   Essential hypertension   Hypokalemia   Insulin-requiring or dependent type II diabetes mellitus (HCC)   Anemia, blood loss   Melena   Gastric inlet patch of esophagus   Multiple gastric polyps   Discharge Condition: Stable  Diet recommendation: High-fiber diet, carb modified diet  Filed Weights   11/23/20 1954  Weight: 110.7 kg    History of present illness:  60 year old female with a history of diabetes mellitus type 2, hypertension, Cushing syndrome, diverticulosis, hyperlipidemia came in for evaluation of recurrent rectal bleeding. She was discharged from the hospital on 11/23/2020 and return on the evening of 11/23/2020 due to having recurrent bleed from the rectum after returning home. She had colonoscopy on 11/22/2020 that did not show diverticular bleed or hemorrhaging. Bleeding was presumably from diverticular bleeding. CT angiography of the abdomen pelvis was done to see if there was acute bleed however it was negative.   Hospital Course:  Diverticulosis of colon with hemorrhage CTA of the abdomen and pelvis done on this admission did not show source of bleeding. Surgery has been consulted who offered subtotal colectomy however the patient refused. GI was consulted she is status post EGD that showed no source of bleeding. Capsule endoscopy results are pending and she will follow up with GI with results as an outpatient. Her hemoglobin has remained stable.  Acute blood loss anemia: Hemoglobin on admission was 6.9 she was transfused 1 unit of packed red blood cells and  was given IV iron. Hemoglobin is stable continue iron sulfate as an outpatient.  Electrolyte imbalance hypomagnesemia: Was repleted now resolved.  Diabetes mellitus type 2: No changes made to her medication continue current home regiment.  Essential hypertension: No changes made to her medication continue Coreg and irbesartan.    Procedures:  Endoscopy that showed no signs of bleeding  Capsule endoscopy  Consultations:  Gastroenterology  Discharge Exam: Vitals:   11/28/20 2051 11/29/20 0459  BP: 134/77 (!) 149/82  Pulse: 74 72  Resp: 18 16  Temp: 99 F (37.2 C) 99.3 F (37.4 C)  SpO2: 96% 94%    General: Appears in no acute distress Cardiovascular: S1-S2, regular Respiratory: Clear to auscultation bilaterally  Discharge Instructions   Discharge Instructions    Diet - low sodium heart healthy   Complete by: As directed    Diet - low sodium heart healthy   Complete by: As directed    Increase activity slowly   Complete by: As directed    Increase activity slowly   Complete by: As directed      Allergies as of 11/29/2020      Reactions   Norvasc [amlodipine Besylate] Swelling   ANGIOEDEMA   Nsaids Other (See Comments)   GI upset/ GI bleed   Sulfa Antibiotics Other (See Comments)   childhood      Medication List    TAKE these medications   acetaminophen 325 MG tablet Commonly known as: TYLENOL Take 2 tablets (650 mg total) by mouth every 6 (six) hours as needed for mild pain (or Fever >/= 101). What changed: when to take this   atorvastatin 10 MG tablet Commonly  known as: LIPITOR Take 10 mg by mouth daily.   Basaglar KwikPen 100 UNIT/ML Inject 45 Units into the skin at bedtime.   calcium citrate-vitamin D 315-200 MG-UNIT tablet Commonly known as: CITRACAL+D Take 1 tablet by mouth daily.   carvedilol 25 MG tablet Commonly known as: COREG Take 25 mg by mouth 2 (two) times daily.   dicyclomine 20 MG tablet Commonly known as:  BENTYL Take 1 tablet (20 mg total) by mouth 2 (two) times daily as needed for spasms.   glimepiride 2 MG tablet Commonly known as: AMARYL Take 2 mg by mouth daily.   Jardiance 10 MG Tabs tablet Generic drug: empagliflozin Take 10 mg by mouth daily.   metFORMIN 1000 MG tablet Commonly known as: GLUCOPHAGE Take 500 mg by mouth 2 (two) times daily with a meal.   multivitamin with minerals tablet Take 1 tablet by mouth daily.   olmesartan 40 MG tablet Commonly known as: BENICAR Take 40 mg by mouth daily.   pantoprazole 40 MG tablet Commonly known as: Protonix Take 1 tablet (40 mg total) by mouth daily.   Potassium Chloride ER 20 MEQ Tbcr Take 20 mEq by mouth daily.      Allergies  Allergen Reactions  . Norvasc [Amlodipine Besylate] Swelling    ANGIOEDEMA  . Nsaids Other (See Comments)    GI upset/ GI bleed  . Sulfa Antibiotics Other (See Comments)    childhood    Follow-up Information    Cirigliano, Vito V, DO. Schedule an appointment as soon as possible for a visit in 2 week(s).   Specialty: Gastroenterology Contact information: [place] [address] [phone]        Carol Morning, DO Follow up in 1 week(s).   Specialty: Family Medicine Contact information: [address] [place] [address] [phone]                The results of significant diagnostics from this hospitalization (including imaging, microbiology, ancillary and laboratory) are listed below for reference.    Significant Diagnostic Studies: NM GI Blood Loss  Result Date: 11/26/2020 CLINICAL DATA:  GI bleed EXAM: NUCLEAR MEDICINE GASTROINTESTINAL BLEEDING SCAN TECHNIQUE: Sequential abdominal images were obtained following intravenous administration of Tc-25m labeled red blood cells. RADIOPHARMACEUTICALS:  22.0 mCi Tc-34m pertechnetate in-vitro labeled red cells. COMPARISON:  CT 11/23/2020 FINDINGS: Imaging was continued for 2 hours.  There is no radiotracer accumulation seen to suggest or localize GI bleed. IMPRESSION: No evidence of active GI bleed by nuclear imaging. Electronically Signed   By: Rolm Baptise M.D.   On: 11/26/2020 23:44   CT Angio Abd/Pel W and/or Wo Contrast  Result Date: 11/23/2020 CLINICAL DATA:  GI bleed EXAM: CTA ABDOMEN AND PELVIS WITHOUT AND WITH CONTRAST TECHNIQUE: Multidetector CT imaging of the abdomen and pelvis was performed using the standard protocol during bolus administration of intravenous contrast. Multiplanar reconstructed images and MIPs were obtained and reviewed to evaluate the vascular anatomy. CONTRAST:  176mL OMNIPAQUE IOHEXOL 350 MG/ML SOLN COMPARISON:  CT dated 10/24/2020 FINDINGS: VASCULAR Aorta: Normal caliber aorta without aneurysm, dissection, vasculitis or significant stenosis. Celiac: Patent without evidence of aneurysm, dissection, vasculitis or significant stenosis. SMA: Patent without evidence of aneurysm, dissection, vasculitis or significant stenosis. Renals: Both renal arteries are patent without evidence of aneurysm, dissection, vasculitis, fibromuscular dysplasia or significant stenosis. IMA: Patent without evidence of aneurysm, dissection, vasculitis or significant stenosis. Inflow: Patent without evidence of aneurysm, dissection, vasculitis or significant stenosis. Proximal  Outflow: Bilateral common femoral and visualized portions of the superficial and profunda femoral arteries are patent without evidence of aneurysm, dissection, vasculitis or significant stenosis. Veins: No obvious venous abnormality within the limitations of this arterial phase study. Review of the MIP images confirms the above findings. NON-VASCULAR Lower chest: The lung bases are clear. The heart size is normal. Hepatobiliary: The liver is normal. Status post cholecystectomy.There is no biliary ductal dilation. Pancreas: Normal contours without ductal dilatation. No peripancreatic fluid collection. Spleen:  Unremarkable. Adrenals/Urinary Tract: --Adrenal glands: Unremarkable. --Right kidney/ureter: Multiple right renal cysts are noted. --Left kidney/ureter: Multiple left renal cysts are noted. --Urinary bladder: Unremarkable. Stomach/Bowel: --Stomach/Duodenum: There is a linear hyperdense focus within the gastric lumen which may represent an ingested hyperdense pill. --Small bowel: Unremarkable. --Colon: There is scattered colonic diverticula without CT evidence for diverticulitis. --Appendix: Normal. Lymphatic: --No retroperitoneal lymphadenopathy. --No mesenteric lymphadenopathy. --No pelvic or inguinal lymphadenopathy. Reproductive: Multiple calcified fibroids involving the patient's uterus. Other: No ascites or free air. The abdominal wall is normal. Musculoskeletal. No acute displaced fractures. IMPRESSION: 1. No acute findings. 2. Colonic diverticulosis without CT evidence for diverticulitis. 3. Fibroid uterus. Electronically Signed   By: Constance Holster M.D.   On: 11/23/2020 23:39    Microbiology: Recent Results (from the past 240 hour(s))  Resp Panel by RT-PCR (Flu A&B, Covid) Nasopharyngeal Swab     Status: None   Collection Time: 11/19/20  4:58 PM   Specimen: Nasopharyngeal Swab; Nasopharyngeal(NP) swabs in vial transport medium  Result Value Ref Range Status   SARS Coronavirus 2 by RT PCR NEGATIVE NEGATIVE Final    Comment: (NOTE) SARS-CoV-2 target nucleic acids are NOT DETECTED.  The SARS-CoV-2 RNA is generally detectable in upper respiratory specimens during the acute phase of infection. The lowest concentration of SARS-CoV-2 viral copies this assay can detect is 138 copies/mL. A negative result does not preclude SARS-Cov-2 infection and should not be used as the sole basis for treatment or other patient management decisions. A negative result may occur with  improper specimen collection/handling, submission of specimen other than nasopharyngeal swab, presence of viral mutation(s)  within the areas targeted by this assay, and inadequate number of viral copies(<138 copies/mL). A negative result must be combined with clinical observations, patient history, and epidemiological information. The expected result is Negative.  Fact Sheet for Patients:  EntrepreneurPulse.com.au  Fact Sheet for Healthcare Providers:  IncredibleEmployment.be  This test is no t yet approved or cleared by the [place] FDA and  has been authorized for detection and/or diagnosis of SARS-CoV-2 by FDA under an Emergency Use Authorization (EUA). This EUA will remain  in effect (meaning this test can be used) for the duration of the COVID-19 declaration under Section 564(b)(1) of the Act, 21 U.S.C.section 360bbb-3(b)(1), unless the authorization is terminated  or revoked sooner.       Influenza A by PCR NEGATIVE NEGATIVE Final   Influenza B by PCR NEGATIVE NEGATIVE Final    Comment: (NOTE) The Xpert Xpress SARS-CoV-2/FLU/RSV plus assay is intended as an aid in the diagnosis of influenza from Nasopharyngeal swab specimens and should not be used as a sole basis for treatment. Nasal washings and aspirates are unacceptable for Xpert Xpress SARS-CoV-2/FLU/RSV testing.  Fact Sheet for Patients: EntrepreneurPulse.com.au  Fact Sheet for Healthcare Providers: IncredibleEmployment.be  This test is not yet approved or cleared by the [place] FDA and has been authorized for detection and/or diagnosis of SARS-CoV-2 by FDA under an Emergency Use Authorization (EUA). This EUA will  remain in effect (meaning this test can be used) for the duration of the COVID-19 declaration under Section 564(b)(1) of the Act, 21 U.S.C. section 360bbb-3(b)(1), unless the authorization is terminated or revoked.  Performed at [facility], [place] [address], [place], [place] [address]   Resp Panel by RT-PCR (Flu A&B,  Covid) Nasopharyngeal Swab     Status: None   Collection Time: 11/24/20 12:55 AM   Specimen: Nasopharyngeal Swab; Nasopharyngeal(NP) swabs in vial transport medium  Result Value Ref Range Status   SARS Coronavirus 2 by RT PCR NEGATIVE NEGATIVE Final    Comment: (NOTE) SARS-CoV-2 target nucleic acids are NOT DETECTED.  The SARS-CoV-2 RNA is generally detectable in upper respiratory specimens during the acute phase of infection. The lowest concentration of SARS-CoV-2 viral copies this assay can detect is 138 copies/mL. A negative result does not preclude SARS-Cov-2 infection and should not be used as the sole basis for treatment or other patient management decisions. A negative result may occur with  improper specimen collection/handling, submission of specimen other than nasopharyngeal swab, presence of viral mutation(s) within the areas targeted by this assay, and inadequate number of viral copies(<138 copies/mL). A negative result must be combined with clinical observations, patient history, and epidemiological information. The expected result is Negative.  Fact Sheet for Patients:  EntrepreneurPulse.com.au  Fact Sheet for Healthcare Providers:  IncredibleEmployment.be  This test is no t yet approved or cleared by the [place] FDA and  has been authorized for detection and/or diagnosis of SARS-CoV-2 by FDA under an Emergency Use Authorization (EUA). This EUA will remain  in effect (meaning this test can be used) for the duration of the COVID-19 declaration under Section 564(b)(1) of the Act, 21 U.S.C.section 360bbb-3(b)(1), unless the authorization is terminated  or revoked sooner.       Influenza A by PCR NEGATIVE NEGATIVE Final   Influenza B by PCR NEGATIVE NEGATIVE Final    Comment: (NOTE) The Xpert Xpress SARS-CoV-2/FLU/RSV plus assay is intended as an aid in the diagnosis of influenza from Nasopharyngeal swab specimens and should  not be used as a sole basis for treatment. Nasal washings and aspirates are unacceptable for Xpert Xpress SARS-CoV-2/FLU/RSV testing.  Fact Sheet for Patients: EntrepreneurPulse.com.au  Fact Sheet for Healthcare Providers: IncredibleEmployment.be  This test is not yet approved or cleared by the [place] FDA and has been authorized for detection and/or diagnosis of SARS-CoV-2 by FDA under an Emergency Use Authorization (EUA). This EUA will remain in effect (meaning this test can be used) for the duration of the COVID-19 declaration under Section 564(b)(1) of the Act, 21 U.S.C. section 360bbb-3(b)(1), unless the authorization is terminated or revoked.  Performed at [facility], [place] [address] [place],  [address]      Labs: Basic Metabolic Panel: Recent Labs  Lab 11/22/20 2357 11/24/20 0400 11/25/20 0333 11/27/20 0952 11/28/20 0345 11/29/20 0322  NA 136 140  --  140 142 140  K 3.3* 4.0  --  3.5 3.5 3.3*  CL 104 107  --  105 107 104  CO2 24 26  --  26 26 28   GLUCOSE 135* 217*  --  208* 105* 149*  BUN <5* 9  --  6 6 5*  CREATININE 0.76 0.86  --  0.71 0.72 0.70  CALCIUM 8.4* 8.3*  --  9.0 8.7* 8.5*  MG 1.6*  --  1.8  --   --   --    Liver Function Tests: Recent  Labs  Lab 11/28/20 0345  AST 14*  ALT 12  ALKPHOS 42  BILITOT 0.5  PROT 6.3*  ALBUMIN 3.3*   No results for input(s): LIPASE, AMYLASE in the last 168 hours. No results for input(s): AMMONIA in the last 168 hours. CBC: Recent Labs  Lab 11/23/20 2018 11/24/20 0120 11/24/20 0400 11/24/20 1125 11/25/20 0333 11/25/20 1000 11/26/20 1102 11/26/20 1640 11/27/20 0333 11/28/20 0345 11/29/20 0322  WBC 11.4*  --  10.1  --  8.7  --   --   --   --  9.7  --   HGB 8.4*   < > 7.3*   < > 8.1*   < > 8.3* 8.4* 8.2* 8.4* 8.4*  HCT 27.3*   < > 24.1*   < > 25.6*   < > 26.5* 26.2* 26.3* 27.6* 27.8*  MCV 78.0*  --  78.0*  --  78.8*  --   --   --   --   80.0  --   PLT 252  --  235  --  266  --   --   --   --  389  --    < > = values in this interval not displayed.   Cardiac Enzymes: No results for input(s): CKTOTAL, CKMB, CKMBINDEX, TROPONINI in the last 168 hours. BNP: BNP (last 3 results) No results for input(s): BNP in the last 8760 hours.  ProBNP (last 3 results) No results for input(s): PROBNP in the last 8760 hours.  CBG: Recent Labs  Lab 11/28/20 1512 11/28/20 1546 11/28/20 1656 11/28/20 2052 11/29/20 0716  GLUCAP 60* 88 89 109* 111*       Signed:  Charlynne Cousins MD.  [facility] 11/29/2020, 10:58 AM

## 2020-11-29 NOTE — Anesthesia Postprocedure Evaluation (Signed)
Anesthesia Post Note  Patient: Carol Mueller  Procedure(s) Performed: ESOPHAGOGASTRODUODENOSCOPY (EGD) WITH PROPOFOL (N/A ) GIVENS CAPSULE STUDY (N/A )     Patient location during evaluation: Endoscopy Anesthesia Type: MAC Level of consciousness: awake and alert Pain management: pain level controlled Vital Signs Assessment: post-procedure vital signs reviewed and stable Respiratory status: spontaneous breathing, nonlabored ventilation, respiratory function stable and patient connected to nasal cannula oxygen Cardiovascular status: blood pressure returned to baseline and stable Postop Assessment: no apparent nausea or vomiting Anesthetic complications: no   No complications documented.  Last Vitals:  Vitals:   11/28/20 2051 11/29/20 0459  BP: 134/77 (!) 149/82  Pulse: 74 72  Resp: 18 16  Temp: 37.2 C 37.4 C  SpO2: 96% 94%    Last Pain:  Vitals:   11/29/20 0459  TempSrc: Oral  PainSc:                  Fallon Howerter L Victorina Kable

## 2020-11-29 NOTE — Procedures (Signed)
Video Capsule images downloaded and reviewed and notable for the following:  Indication: Recurrent overt, obscure GI bleeding with anemia. Multiple recent admissions without clear source of bleeding on colonoscopy, CTA, tagged RBC scan. CTA from 2017 with active bleeding in left colon that was then not seen on selective arteriogram. EGD unrevealing. Video Capsule placed directly into small bowel.   Findings: -Complete study with adequate prep -First duodenal image immediately following endoscope placement at 00:01:32 -First cecal image at 01:25:39 -Possible erosion vs focal erythema noted at 01:00:41, about 25 mins from ICV. This was non-bleeding. Based on appearance, favor incidental focal erythema.  -Otherwise normal small bowel throughout.  Summary: 1) Small area of focal erythema vs subtle non-bleeding erosion in distal small bowel. This appears to be an incidental finding and not likely to be of clinical consequence and unlikely to be the source of recurrent bleeding. This would be out of reach of a single balloon enteroscope and do not feel that referral for retrograde double balloon enteroscopy is necessary. 2) Otherwise completely normal study. No blood noted throughout small bowel lumen and in visualized colon. 3) Based on these findings and recent extensive evaluation, favor recurrent diverticular bleed.  Recommendations: -Ok to discharge to home today -Repeat CBC in 7-10 days to ensure improvement -Follow-up in GI clinic as well as in surgical clinic as planned -Advance diet  -Return to ER if recurrence of bleeding. If recurrence, recommend tagged RBC scan in ER

## 2020-12-03 ENCOUNTER — Encounter (HOSPITAL_COMMUNITY): Payer: Self-pay | Admitting: Gastroenterology

## 2020-12-04 ENCOUNTER — Telehealth: Payer: Self-pay

## 2020-12-04 ENCOUNTER — Other Ambulatory Visit: Payer: Self-pay | Admitting: Nurse Practitioner

## 2020-12-04 DIAGNOSIS — K5731 Diverticulosis of large intestine without perforation or abscess with bleeding: Secondary | ICD-10-CM

## 2020-12-04 DIAGNOSIS — D649 Anemia, unspecified: Secondary | ICD-10-CM

## 2020-12-04 NOTE — Telephone Encounter (Signed)
-----   Message from Noralyn Pick, NP sent at 11/29/2020  1:18 PM EST ----- Carol Mueller was discharged home today from [facility]. Can you contact her and facilitate repeat CBC and CMP in one week. Let her know she was scheduled for a follow up appt with me on Mon 1/31 at 10am, if that doesn't work for her you can change it but she should be seen by Dr. [name] or APP within 2 weeks.   We will contact her once Dr. Bryan Lemma reads her small bowel capsule report, I know she will want results asap. Thx

## 2020-12-04 NOTE — Telephone Encounter (Signed)
Spoke to patient this morning. She will come by the [facility] office on 12/06/20 for labs. She has been informed of her upcoming appointment on 12/16/20 with [facility]. All questions answered. Patient voiced understanding.

## 2020-12-06 ENCOUNTER — Other Ambulatory Visit (INDEPENDENT_AMBULATORY_CARE_PROVIDER_SITE_OTHER): Payer: No Typology Code available for payment source

## 2020-12-06 DIAGNOSIS — D649 Anemia, unspecified: Secondary | ICD-10-CM | POA: Diagnosis not present

## 2020-12-06 DIAGNOSIS — K5731 Diverticulosis of large intestine without perforation or abscess with bleeding: Secondary | ICD-10-CM

## 2020-12-06 LAB — COMPREHENSIVE METABOLIC PANEL
ALT: 9 U/L (ref 0–35)
AST: 11 U/L (ref 0–37)
Albumin: 4.1 g/dL (ref 3.5–5.2)
Alkaline Phosphatase: 52 U/L (ref 39–117)
BUN: 11 mg/dL (ref 6–23)
CO2: 29 mEq/L (ref 19–32)
Calcium: 9.1 mg/dL (ref 8.4–10.5)
Chloride: 104 mEq/L (ref 96–112)
Creatinine, Ser: 0.93 mg/dL (ref 0.40–1.20)
GFR: 66.81 mL/min (ref >60.00)
Glucose, Bld: 132 mg/dL — ABNORMAL HIGH (ref 70–99)
Potassium: 4 mEq/L (ref 3.5–5.1)
Sodium: 139 mEq/L (ref 135–145)
Total Bilirubin: 0.3 mg/dL (ref 0.2–1.2)
Total Protein: 7.5 g/dL (ref 6.0–8.3)

## 2020-12-06 LAB — CBC
HCT: 33.4 % — ABNORMAL LOW (ref 36.0–46.0)
Hemoglobin: 10.4 g/dL — ABNORMAL LOW (ref 12.0–15.0)
MCHC: 31.3 g/dL (ref 30.0–36.0)
MCV: 76.6 fl — ABNORMAL LOW (ref 78.0–100.0)
Platelets: 518 10*3/uL — ABNORMAL HIGH (ref 150.0–400.0)
RBC: 4.35 Mil/uL (ref 3.87–5.11)
RDW: 19.3 % — ABNORMAL HIGH (ref 11.5–15.5)
WBC: 7.9 10*3/uL (ref 4.0–10.5)

## 2020-12-11 DIAGNOSIS — E1165 Type 2 diabetes mellitus with hyperglycemia: Secondary | ICD-10-CM | POA: Insufficient documentation

## 2020-12-15 NOTE — Progress Notes (Signed)
12/15/2020 Carol Mueller [id number] [birth year]   Chief Complaint: Hospital follow up, GI bleed  History of Present Illness: Carol Mueller is a 60 year old female with a past medical history of hypertension, hyperlipidemia, type II, Cushing's syndrome, pandiverticulosis with recurrent diverticular bleed, breast biopsy and adrenalectomy.  She was admitted to the hospital 1/4/2022withrecurrent lower GI bleedingand anemia. S/ P colonoscopy1/05/2021 identified pandiverticulosis and 2 benign colon polyps were removed without evidence of active bleeding. She remained hemodynamically stable without further hematochezia and she was discharged home 1/8. Shereturned to the hospital a few hours later with recurrent lower GI bleeding.An abdominal/pelvic CT angiogram 1/08/2022showed evidence of diverticulosis without active GI bleedingtherefore IR interventionwasnot done. Hg 7.5 ->6.9. Transfused 1 unit of PRBCs 1/9->Hg 8.1-> 9.0 -> 8.6 -> 8.1 -> 7.8 ->7.8 -> 8.3 -> 8.4. Received Feraheme x 1 on 1/10.  A stat tagged RBC scan was negative. She underwent an EGD 11/28/2020 which identified a few gastric polyps otherwise was normal.  A video capsule was placed at that time.  The small bowel capsule endoscopy showed a small area of focal erythema versus subtle nonbleeding erosion in the distal small bowel.  No blood was noted throughout the small bowel.  General surgery was consulted earlier during her admission for consideration of a subtotal colectomy, however, the patient was not interested in pursing any surgical intervention. She was discharged home on 11/29/2020.  Repeat laboratory studies were done on 12/06/2020 which showed a Hemoglobin level up to 10.4.  Hematocrit 33.4.   Currently, she reports feeling quite well.  No nausea or vomiting.  She continues to have intermittent transient epigastric pain which last for a few minutes then goes away.  No heartburn or dysphagia.  She remains on  Pantoprazole 40 mg daily.  She is eating a high-fiber diet.  She is drinking at least 64 ounces of water daily.  She is passing a normal Kight formed bowel movement daily.  A few days ago her stool was darker Knightly but not black.  No rectal bleeding.  She is taking MiraLAX one half capful daily to avoid straining.  No other complaints at this time.   CBC Latest Ref Rng & Units 12/06/2020 11/29/2020 11/28/2020  WBC 4.0 - 10.5 K/uL 7.9 - 9.7  Hemoglobin 12.0 - 15.0 g/dL 10.4(L) 8.4(L) 8.4(L)  Hematocrit 36.0 - 46.0 % 33.4(L) 27.8(L) 27.6(L)  Platelets 150.0 - 400.0 K/uL 518.0(H) - 389    CMP Latest Ref Rng & Units 12/06/2020 11/29/2020 11/28/2020  Glucose 70 - 99 mg/dL 132(H) 149(H) 105(H)  BUN 6 - 23 mg/dL 11 5(L) 6  Creatinine 0.40 - 1.20 mg/dL 0.93 0.70 0.72  Sodium 135 - 145 mEq/L 139 140 142  Potassium 3.5 - 5.1 mEq/L 4.0 3.3(L) 3.5  Chloride 96 - 112 mEq/L 104 104 107  CO2 19 - 32 mEq/L 29 28 26   Calcium 8.4 - 10.5 mg/dL 9.1 8.5(L) 8.7(L)  Total Protein 6.0 - 8.3 g/dL 7.5 - 6.3(L)  Total Bilirubin 0.2 - 1.2 mg/dL 0.3 - 0.5  Alkaline Phos 39 - 117 U/L 52 - 42  AST 0 - 37 U/L 11 - 14(L)  ALT 0 - 35 U/L 9 - 12    Small bowel capsule endoscopy 12/03/2020:  EGD 11/28/2020: - Ectopic gastric mucosa in the upper third of the esophagus. - Normal none none none - Normal esophagus. - A few gastric polyps. - Normal incisura, antrum and pylorus. - Normal duodenal bulb, first portion of the duodenum  and second portion of the duodenum. - Successful completion of the Video Capsule Enteroscope placement. - No specimens collected.  Colonoscopy 11/22/2020: - One 9 mm polyp at the appendiceal orifice, removed with a cold snare. Resected and retrieved. - One 6 mm polyp in the transverse colon, removed with a cold snare. Resected and retrieved. - Internal hemorrhoids. - Moderate diverticulosis in the entire examined colon. There was evidence of an impacted diverticulum. There was no evidence of  diverticular bleeding. No blood in the colon. - The exam was otherwise normal on direct and retroflexion views. Biopsy Report: A. COLON, CECAL POLYP, POLYPECTOMY:  - Benign colonic mucosa with lymphoid aggregate.  - No dysplasia or malignancy.   B. COLON, TRANSVERSE POLYP, POLYECTOMY:  - Hyperplastic polyp.  - No dysplasia or malignancy.   Current Outpatient Medications on File Prior to Visit  Medication Sig Dispense Refill  . acetaminophen (TYLENOL) 325 MG tablet Take 2 tablets (650 mg total) by mouth every 6 (six) hours as needed for mild pain (or Fever >/= 101). (Patient taking differently: No sig reported) 40 tablet 0  . atorvastatin (LIPITOR) 10 MG tablet Take 10 mg by mouth daily.    . calcium citrate-vitamin D (CITRACAL+D) 315-200 MG-UNIT tablet Take 1 tablet by mouth daily.    . carvedilol (COREG) 25 MG tablet Take 25 mg by mouth 2 (two) times daily.    Marland Kitchen dicyclomine (BENTYL) 20 MG tablet Take 1 tablet (20 mg total) by mouth 2 (two) times daily as needed for spasms. 60 tablet 0  . empagliflozin (JARDIANCE) 10 MG TABS tablet Take 10 mg by mouth daily.    Marland Kitchen glimepiride (AMARYL) 2 MG tablet Take 2 mg by mouth daily.    . Insulin Glargine (BASAGLAR KWIKPEN) 100 UNIT/ML Inject 40 Units into the skin at bedtime.    . metFORMIN (GLUCOPHAGE) 1000 MG tablet Take 500 mg by mouth 2 (two) times daily with a meal.     . Multiple Vitamins-Minerals (MULTIVITAMIN WITH MINERALS) tablet Take 1 tablet by mouth daily.    Marland Kitchen olmesartan (BENICAR) 40 MG tablet Take 40 mg by mouth daily.    . pantoprazole (PROTONIX) 40 MG tablet Take 1 tablet (40 mg total) by mouth daily. 30 tablet 3  . Potassium Chloride ER 20 MEQ TBCR Take 20 mEq by mouth daily.     No current facility-administered medications on file prior to visit.   Allergies  Allergen Reactions  . Norvasc [Amlodipine Besylate] Swelling    ANGIOEDEMA  . Nsaids Other (See Comments)    GI upset/ GI bleed  . Sulfa Antibiotics Other (See Comments)     childhood    Current Medications, Allergies, Past Medical History, Past Surgical History, Family History and Social History were reviewed in [employer] record.   Review of Systems:   Constitutional: Negative for fever, sweats, chills or weight loss.  Respiratory: Negative for shortness of breath.   Cardiovascular: Negative for chest pain, palpitations and leg swelling.  Gastrointestinal: See HPI.  Musculoskeletal: Negative for back pain or muscle aches.  Neurological: Negative for dizziness, headaches or paresthesias.    Physical Exam: BP 132/80 (BP Location: Left Arm, Patient Position: Sitting, Cuff Size: Normal)   Pulse 83   Ht 5' 2.75" (1.594 m)   Wt 246 lb 6 oz (111.8 kg)   BMI 43.99 kg/m   General: Well developed 60 year old female in no acute distress. Head: Normocephalic and atraumatic. Eyes: No scleral icterus. Conjunctiva pink . Ears:  Normal auditory acuity. Mouth: Dentition intact. No ulcers or lesions.  Lungs: Clear throughout to auscultation. Heart: Regular rate and rhythm, no murmur. Abdomen: Soft, nondistended. Mild tenderness above the umbilicus without rebound or guarding. No masses or hepatomegaly. Normal bowel sounds x 4 quadrants.  Rectal: Deferred.  Musculoskeletal: Symmetrical with no gross deformities. Extremities: No edema. Neurological: Alert oriented x 4. No focal deficits.  Psychological: Alert and cooperative. Normal mood and affect  Assessment and Recommendations:  11.  60 year old female who was recently admitted to the hospital twice with recurrent diverticular bleeding with associated anemia which required multiple blood transfusions and Feraheme IV  x 1. S/P colonoscopy1/7/2022identifiedpandiverticulosis and 2benign colonpolyps were removedwithout evidence of active bleeding. An abdominal/pelvic CT angiogram 1/08/2022showed evidence of diverticulosis without active GI bleedingtherefore IR interventionwasnot  done. A stat tagged RBC scan was negative. She underwent an EGD 11/28/2020 which identified a few gastric polyps otherwise was normal.  A video capsule was placed at that time.  The small bowel capsule endoscopy showed a small area of focal erythema versus subtle nonbleeding erosion in the distal small bowel.  No blood was noted throughout the small bowel.  She wishes to avoid a subtotal colectomy unless absolutely necessary.  -CBC, iron, iron saturation, TIBC and ferritin level -Will determine if she needs po iron after the above labs reviewed -Patient to call office if hematochezia recurs -Patient to present to the ED if she develops large-volume hematochezia -Follow up with Dr. [name] in 3 months, Dr. [name] to verify colonoscopy recall date  2.  GERD, Epigastric pain -Continue Pantoprazole 40mg  QD   3. Diabetes mellitus type 2  4. Cushing syndrome

## 2020-12-16 ENCOUNTER — Other Ambulatory Visit (INDEPENDENT_AMBULATORY_CARE_PROVIDER_SITE_OTHER): Payer: No Typology Code available for payment source

## 2020-12-16 ENCOUNTER — Other Ambulatory Visit: Payer: Self-pay

## 2020-12-16 ENCOUNTER — Ambulatory Visit (INDEPENDENT_AMBULATORY_CARE_PROVIDER_SITE_OTHER): Payer: No Typology Code available for payment source | Admitting: Nurse Practitioner

## 2020-12-16 VITALS — BP 132/80 | HR 83 | Ht 62.75 in | Wt 246.4 lb

## 2020-12-16 DIAGNOSIS — D649 Anemia, unspecified: Secondary | ICD-10-CM | POA: Diagnosis not present

## 2020-12-16 DIAGNOSIS — Z8719 Personal history of other diseases of the digestive system: Secondary | ICD-10-CM

## 2020-12-16 LAB — CBC
HCT: 34.9 % — ABNORMAL LOW (ref 36.0–46.0)
Hemoglobin: 11.1 g/dL — ABNORMAL LOW (ref 12.0–15.0)
MCHC: 31.8 g/dL (ref 30.0–36.0)
MCV: 74.7 fl — ABNORMAL LOW (ref 78.0–100.0)
Platelets: 363 10*3/uL (ref 150.0–400.0)
RBC: 4.66 Mil/uL (ref 3.87–5.11)
RDW: 17.6 % — ABNORMAL HIGH (ref 11.5–15.5)
WBC: 8.4 10*3/uL (ref 4.0–10.5)

## 2020-12-16 NOTE — Patient Instructions (Addendum)
If you are age 61 or younger, your body mass index should be between 19-25. Your Body mass index is 43.99 kg/m. If this is out of the aformentioned range listed, please consider follow up with your Primary Care Provider.   LABS:   Your provider has requested that you go to the basement level for lab work before leaving today. Press "B" on the elevator. The lab is located at the first door on the left as you exit the elevator.  HEALTHCARE LAWS AND MY CHART RESULTS: Due to recent changes in healthcare laws, you may see the results of your imaging and laboratory studies on MyChart before your provider has had a chance to review them.   We understand that in some cases there may be results that are confusing or concerning to you. Not all laboratory results come back in the same time frame and the provider may be waiting for multiple results in order to interpret others.  Please give [place] 48 hours in order for your provider to thoroughly review all the results before contacting the office for clarification of your results.   Please continue taking the Miralax and Protonix.  Please call our office if blood from the rectum reoccurs.  Please follow up with Dr. [name] in 3 months.  It was great seeing you today!  Thank you for entrusting me with your care and choosing [facility].  Noralyn Pick, CRNP

## 2020-12-17 LAB — IRON,TIBC AND FERRITIN PANEL
%SAT: 11 % (calc) — ABNORMAL LOW (ref 16–45)
Ferritin: 34 ng/mL (ref 16–232)
Iron: 40 ug/dL — ABNORMAL LOW (ref 45–160)
TIBC: 358 mcg/dL (calc) (ref 250–450)

## 2020-12-17 NOTE — Progress Notes (Signed)
Carol Mueller, please add colonoscopy recall date Jan. 2032. Thx

## 2020-12-17 NOTE — Progress Notes (Signed)
I agree with the above note, plan.  Her next screening colonoscopy is not necessary until January 2032 given the fact that she has had no precancerous polyps on colonoscopy 2022 and only 2 small precancerous polyps in 2020.

## 2020-12-18 NOTE — Progress Notes (Signed)
Recall placed for 2032

## 2020-12-25 ENCOUNTER — Other Ambulatory Visit: Payer: Self-pay | Admitting: Nurse Practitioner

## 2020-12-25 DIAGNOSIS — Z8719 Personal history of other diseases of the digestive system: Secondary | ICD-10-CM

## 2020-12-25 DIAGNOSIS — D649 Anemia, unspecified: Secondary | ICD-10-CM

## 2021-02-06 ENCOUNTER — Other Ambulatory Visit: Payer: Self-pay | Admitting: Family Medicine

## 2021-02-06 DIAGNOSIS — Z1231 Encounter for screening mammogram for malignant neoplasm of breast: Secondary | ICD-10-CM

## 2021-02-25 ENCOUNTER — Other Ambulatory Visit (INDEPENDENT_AMBULATORY_CARE_PROVIDER_SITE_OTHER): Payer: No Typology Code available for payment source

## 2021-02-25 DIAGNOSIS — D649 Anemia, unspecified: Secondary | ICD-10-CM

## 2021-02-25 DIAGNOSIS — Z8719 Personal history of other diseases of the digestive system: Secondary | ICD-10-CM | POA: Diagnosis not present

## 2021-02-25 LAB — CBC
HCT: 38.4 % (ref 36.0–46.0)
Hemoglobin: 12 g/dL (ref 12.0–15.0)
MCHC: 31.1 g/dL (ref 30.0–36.0)
MCV: 66.1 fl — ABNORMAL LOW (ref 78.0–100.0)
Platelets: 434 10*3/uL — ABNORMAL HIGH (ref 150.0–400.0)
RBC: 5.82 Mil/uL — ABNORMAL HIGH (ref 3.87–5.11)
RDW: 17.4 % — ABNORMAL HIGH (ref 11.5–15.5)
WBC: 11.5 10*3/uL — ABNORMAL HIGH (ref 4.0–10.5)

## 2021-02-26 LAB — IRON,TIBC AND FERRITIN PANEL
%SAT: 6 % (calc) — ABNORMAL LOW (ref 16–45)
Ferritin: 5 ng/mL — ABNORMAL LOW (ref 16–232)
Iron: 26 ug/dL — ABNORMAL LOW (ref 45–160)
TIBC: 436 mcg/dL (calc) (ref 250–450)

## 2021-03-12 ENCOUNTER — Ambulatory Visit (INDEPENDENT_AMBULATORY_CARE_PROVIDER_SITE_OTHER): Payer: No Typology Code available for payment source | Admitting: Gastroenterology

## 2021-03-12 ENCOUNTER — Encounter: Payer: Self-pay | Admitting: Gastroenterology

## 2021-03-12 VITALS — BP 132/70 | HR 85 | Ht 63.0 in | Wt 251.0 lb

## 2021-03-12 DIAGNOSIS — K922 Gastrointestinal hemorrhage, unspecified: Secondary | ICD-10-CM

## 2021-03-12 DIAGNOSIS — K5731 Diverticulosis of large intestine without perforation or abscess with bleeding: Secondary | ICD-10-CM

## 2021-03-12 DIAGNOSIS — Z8719 Personal history of other diseases of the digestive system: Secondary | ICD-10-CM | POA: Diagnosis not present

## 2021-03-12 NOTE — Progress Notes (Signed)
Review of pertinent gastrointestinal problems: 1.Routine riskfor colon cancer,colonoscopy Dr. Verl Blalock 12/2012 found diverticulosis only. He recommended repeat colon cancer screening with colonoscopy at 10 year interval. Colonoscopy November 2020 during hospital admission for recurrent GI bleeding found 2 subcentimeter polyps, 1 of these was adenomatous.  Recommended recall 09/2026. 2. Recurrent "Acute diverticular bleeding" in proximal descending colon July 2017. This was documented on CT angiogram during her bleeding episode. She went straight to angiogram by interventional radiology and there was no active bleeding at the time of that exam. Also overt GI bleeding episode 07/2015: did not need blood transfusion then.  Colonoscopy November 2020 during hospital admission for recurrent GI bleeding found pan diverticulosis and also internal hemorrhoids.  Repeat hospital admission January 2022 for overt GI bleeding, hemoglobin nadir 8.3; she underwent a repeat colonoscopy January 2022, Dr. Fuller Plan, 2 polyps were removed, neither of those were precancerous.  Pan diverticulosis was again noted.  She underwent EGD with capsule placement January 2022 showed some inconsequential polyps in her stomach, capsule endoscopy showed no clear sign of source of bleeding.  CT angio, nuc med scan all negative.  This was again presumed to be a diverticular bleed.      HPI: This is a very pleasant 60 year old woman  Blood work 2 weeks ago showed hemoglobin normal at 12, MCV 66, RDW 17.4, ferritin 5.  She was told to increase her iron supplementation to daily rather than every other day.  I last saw her 1 or 2 years ago however she was in the hospital again for 11-day admission for recurrent overt GI bleeding that was presumed to be diverticular.  See that summary above.  She believes she got 2 units of blood and 1 iron infusion  When she has bleeds it is very obvious, pouring out of bright red blood with very mild or  0 abdominal discomfort.  Since her hospital stay she was taking iron every other day until 2 weeks ago when we asked her to increase to daily iron.  She feels well.  She does not take aspirin or NSAIDs.  Her weight is overall stable.   ROS: complete GI ROS as described in HPI, all other review negative.  Constitutional:  No unintentional weight loss   Past Medical History:  Diagnosis Date  . Anemia   . Cushing's syndrome ([place])    due to adrenal tumor  . Diabetes mellitus without complication (HCC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  . Diverticulitis   . Diverticulosis   . GI bleed   . Hyperlipidemia   . Hypertension   . Polycystic ovary disease   . Sepsis ([place]) 05/2019    Past Surgical History:  Procedure Laterality Date  . ADRENALECTOMY  1996  . BREAST BIOPSY    . CHOLECYSTECTOMY    . COLONOSCOPY WITH PROPOFOL N/A 10/03/2019   Procedure: COLONOSCOPY WITH PROPOFOL;  Surgeon: Ladene Artist, MD;  Location: [facility];  Service: Endoscopy;  Laterality: N/A;  . COLONOSCOPY WITH PROPOFOL N/A 11/22/2020   Procedure: COLONOSCOPY WITH PROPOFOL;  Surgeon: Ladene Artist, MD;  Location: [facility];  Service: Endoscopy;  Laterality: N/A;  . ESOPHAGOGASTRODUODENOSCOPY (EGD) WITH PROPOFOL N/A 07/16/2015   Procedure: ESOPHAGOGASTRODUODENOSCOPY (EGD) WITH PROPOFOL;  Surgeon: [name], MD;  Location: [facility];  Service: Endoscopy;  Laterality: N/A;  . ESOPHAGOGASTRODUODENOSCOPY (EGD) WITH PROPOFOL N/A 11/28/2020   Procedure: ESOPHAGOGASTRODUODENOSCOPY (EGD) WITH PROPOFOL;  Surgeon: Lavena Bullion, DO;  Location: [facility];  Service: Gastroenterology;  Laterality: N/A;  .  GIVENS CAPSULE STUDY N/A 11/28/2020   Procedure: GIVENS CAPSULE STUDY;  Surgeon: Lavena Bullion, DO;  Location: [facility];  Service: Gastroenterology;  Laterality: N/A;  . POLYPECTOMY  10/03/2019   Procedure: POLYPECTOMY;  Surgeon: Ladene Artist, MD;  Location: [facility];  Service: Endoscopy;;  .  POLYPECTOMY  11/22/2020   Procedure: POLYPECTOMY;  Surgeon: Ladene Artist, MD;  Location: [facility];  Service: Endoscopy;;    Current Outpatient Medications  Medication Sig Dispense Refill  . acetaminophen (TYLENOL) 325 MG tablet Take 2 tablets (650 mg total) by mouth every 6 (six) hours as needed for mild pain (or Fever >/= 101). (Patient taking differently: Take 650 mg by mouth as needed for mild pain (or Fever >/= 101).) 40 tablet 0  . atorvastatin (LIPITOR) 10 MG tablet Take 10 mg by mouth daily.    . calcium citrate-vitamin D (CITRACAL+D) 315-200 MG-UNIT tablet Take 1 tablet by mouth daily.    . carvedilol (COREG) 25 MG tablet Take 25 mg by mouth 2 (two) times daily.    Marland Kitchen dicyclomine (BENTYL) 20 MG tablet Take 1 tablet (20 mg total) by mouth 2 (two) times daily as needed for spasms. 60 tablet 0  . empagliflozin (JARDIANCE) 10 MG TABS tablet Take 10 mg by mouth daily.    Marland Kitchen glimepiride (AMARYL) 2 MG tablet Take 2 mg by mouth daily.    . Insulin Glargine (BASAGLAR KWIKPEN) 100 UNIT/ML Inject 40 Units into the skin at bedtime.    . metFORMIN (GLUCOPHAGE) 1000 MG tablet Take 500 mg by mouth 2 (two) times daily with a meal.     . Multiple Vitamins-Minerals (MULTIVITAMIN WITH MINERALS) tablet Take 1 tablet by mouth daily.    Marland Kitchen olmesartan (BENICAR) 40 MG tablet Take 40 mg by mouth daily.    . pantoprazole (PROTONIX) 40 MG tablet Take 1 tablet (40 mg total) by mouth daily. 30 tablet 3  . Potassium Chloride ER 20 MEQ TBCR Take 20 mEq by mouth daily.    Marland Kitchen spironolactone (ALDACTONE) 50 MG tablet Take 50 mg by mouth daily.     No current facility-administered medications for this visit.    Allergies as of 03/12/2021 - Review Complete 11/28/2020  Allergen Reaction Noted  . Norvasc [amlodipine besylate] Swelling 02/22/2012  . Nsaids Other (See Comments) 08/07/2015  . Sulfa antibiotics Other (See Comments) 02/22/2012    Family History  Problem Relation Age of Onset  . Hypertension Mother    . Cancer Mother        ?uterine or ovarian cancer  . Hypertension Father   . Diabetes Father   . Hypertension Sister   . Diabetes Sister   . Hypertension Brother   . Diabetes Brother   . Hypertension Maternal Grandfather   . Diabetes Paternal Grandmother   . Breast cancer Maternal Grandmother   . Colon cancer Neg Hx     Social History   Socioeconomic History  . Marital status: Single    Spouse name: N/A  . Number of children: 0  . Years of education: Not on file  . Highest education level: Not on file  Occupational History  . Occupation: NURSE    Comment: [employer]  Tobacco Use  . Smoking status: Never Smoker  . Smokeless tobacco: Never Used  Vaping Use  . Vaping Use: Never used  Substance and Sexual Activity  . Alcohol use: No  . Drug use: No  . Sexual activity: Not Currently    Birth control/protection: Post-menopausal  Other  Topics Concern  . Not on file  Social History Narrative   Lives alone. Parents live in [place].   Social Determinants of Health   Financial Resource Strain: Not on file  Food Insecurity: Not on file  Transportation Needs: Not on file  Physical Activity: Not on file  Stress: Not on file  Social Connections: Not on file  Intimate Partner Violence: Not on file     Physical Exam: BP 132/70   Pulse 85   Ht 5\' 3"  (1.6 m)   Wt 251 lb (113.9 kg)   BMI 44.46 kg/m  Constitutional: generally well-appearing Psychiatric: alert and oriented x3 Abdomen: soft, nontender, nondistended, no obvious ascites, no peritoneal signs, normal bowel sounds No peripheral edema noted in lower extremities  Assessment and plan: 60 y.o. female with recurrent overt relatively painless hematochezia that is presumed to be repeat diverticular hemorrhages.  She has pandiverticulosis.  She has probably had 6-10 overt bleeding events in the past 6 years.  I explained to her that I told abdominal colectomy will probably significantly reduce the chances that when  she would ever have bleeding again however that is obviously a drastic solution.  I think what is much more reasonable, and she agrees, is that we try to again narrow down as best we can the segment in her colon which is having the bleeding.  To this end she knows that if she has overt GI bleeding again she should immediately head to the emergency room and she will need CT mesenteric angiogram.  Stat.  In the meantime she will take iron once daily.  We will repeat CBC and iron studies with ferritin in 2 months.  Please see the "Patient Instructions" section for addition details about the plan.  Owens Loffler, MD [facility] 03/12/2021, 8:57 AM   Total time on date of encounter was 30 minutes (this included time spent preparing to see the patient reviewing records; obtaining and/or reviewing separately obtained history; performing a medically appropriate exam and/or evaluation; counseling and educating the patient and family if present; ordering medications, tests or procedures if applicable; and documenting clinical information in the health record).

## 2021-03-12 NOTE — Patient Instructions (Addendum)
If you are age 61 or younger, your body mass index should be between 19-25. Your Body mass index is 44.46 kg/m. If this is out of the aformentioned range listed, please consider follow up with your Primary Care Provider.   Your provider has requested that you go to the basement level for lab work in 2 months (June 2022). Press "B" on the elevator. The lab is located at the first door on the left as you exit the elevator.  Due to recent changes in healthcare laws, you may see the results of your imaging and laboratory studies on MyChart before your provider has had a chance to review them.  We understand that in some cases there may be results that are confusing or concerning to you. Not all laboratory results come back in the same time frame and the provider may be waiting for multiple results in order to interpret others.  Please give [place] 48 hours in order for your provider to thoroughly review all the results before contacting the office for clarification of your results.   Please continue Iron by mouth everyday.  If you have obvious bleeding, please go straight to the Emergency Department.  Thank you for entrusting me with your care and choosing [facility].  Dr [name]

## 2021-04-03 ENCOUNTER — Ambulatory Visit
Admission: RE | Admit: 2021-04-03 | Discharge: 2021-04-03 | Disposition: A | Discharge status: Home / Self Care | Payer: No Typology Code available for payment source | Source: Ambulatory Visit | Attending: Family Medicine | Admitting: Family Medicine

## 2021-04-03 ENCOUNTER — Other Ambulatory Visit: Payer: Self-pay

## 2021-04-03 DIAGNOSIS — Z1231 Encounter for screening mammogram for malignant neoplasm of breast: Secondary | ICD-10-CM

## 2021-05-09 ENCOUNTER — Other Ambulatory Visit (INDEPENDENT_AMBULATORY_CARE_PROVIDER_SITE_OTHER): Payer: No Typology Code available for payment source

## 2021-05-09 DIAGNOSIS — Z8719 Personal history of other diseases of the digestive system: Secondary | ICD-10-CM | POA: Diagnosis not present

## 2021-05-09 DIAGNOSIS — K922 Gastrointestinal hemorrhage, unspecified: Secondary | ICD-10-CM

## 2021-05-09 DIAGNOSIS — K5731 Diverticulosis of large intestine without perforation or abscess with bleeding: Secondary | ICD-10-CM | POA: Diagnosis not present

## 2021-05-09 LAB — CBC
HCT: 38.5 % (ref 36.0–46.0)
Hemoglobin: 12.3 g/dL (ref 12.0–15.0)
MCHC: 31.9 g/dL (ref 30.0–36.0)
MCV: 66.1 fl — ABNORMAL LOW (ref 78.0–100.0)
Platelets: 329 10*3/uL (ref 150.0–400.0)
RBC: 5.82 Mil/uL — ABNORMAL HIGH (ref 3.87–5.11)
RDW: 21.7 % — ABNORMAL HIGH (ref 11.5–15.5)
WBC: 10.8 10*3/uL — ABNORMAL HIGH (ref 4.0–10.5)

## 2021-05-09 LAB — FERRITIN: Ferritin: 38.4 ng/mL (ref 10.0–291.0)

## 2021-05-14 ENCOUNTER — Other Ambulatory Visit: Payer: Self-pay

## 2021-05-14 DIAGNOSIS — Z8719 Personal history of other diseases of the digestive system: Secondary | ICD-10-CM

## 2021-06-24 ENCOUNTER — Other Ambulatory Visit: Payer: Self-pay | Admitting: Endocrinology

## 2021-06-24 DIAGNOSIS — N83209 Unspecified ovarian cyst, unspecified side: Secondary | ICD-10-CM

## 2021-07-08 ENCOUNTER — Inpatient Hospital Stay (HOSPITAL_COMMUNITY): Payer: No Typology Code available for payment source

## 2021-07-08 ENCOUNTER — Encounter (HOSPITAL_COMMUNITY): Payer: Self-pay

## 2021-07-08 ENCOUNTER — Other Ambulatory Visit: Payer: Self-pay

## 2021-07-08 ENCOUNTER — Inpatient Hospital Stay (HOSPITAL_COMMUNITY)
Admission: EM | Admit: 2021-07-08 | Discharge: 2021-07-10 | DRG: 378 | Disposition: A | Discharge status: Home / Self Care | Payer: No Typology Code available for payment source | Attending: Internal Medicine | Admitting: Internal Medicine

## 2021-07-08 DIAGNOSIS — Z8249 Family history of ischemic heart disease and other diseases of the circulatory system: Secondary | ICD-10-CM | POA: Diagnosis not present

## 2021-07-08 DIAGNOSIS — E1169 Type 2 diabetes mellitus with other specified complication: Secondary | ICD-10-CM | POA: Diagnosis present

## 2021-07-08 DIAGNOSIS — I10 Essential (primary) hypertension: Secondary | ICD-10-CM | POA: Diagnosis present

## 2021-07-08 DIAGNOSIS — Z888 Allergy status to other drugs, medicaments and biological substances status: Secondary | ICD-10-CM | POA: Diagnosis not present

## 2021-07-08 DIAGNOSIS — K922 Gastrointestinal hemorrhage, unspecified: Secondary | ICD-10-CM | POA: Diagnosis present

## 2021-07-08 DIAGNOSIS — K5731 Diverticulosis of large intestine without perforation or abscess with bleeding: Secondary | ICD-10-CM | POA: Diagnosis not present

## 2021-07-08 DIAGNOSIS — Z7984 Long term (current) use of oral hypoglycemic drugs: Secondary | ICD-10-CM | POA: Diagnosis not present

## 2021-07-08 DIAGNOSIS — Z833 Family history of diabetes mellitus: Secondary | ICD-10-CM | POA: Diagnosis not present

## 2021-07-08 DIAGNOSIS — E785 Hyperlipidemia, unspecified: Secondary | ICD-10-CM | POA: Diagnosis present

## 2021-07-08 DIAGNOSIS — D62 Acute posthemorrhagic anemia: Secondary | ICD-10-CM | POA: Diagnosis present

## 2021-07-08 DIAGNOSIS — Z794 Long term (current) use of insulin: Secondary | ICD-10-CM

## 2021-07-08 DIAGNOSIS — E119 Type 2 diabetes mellitus without complications: Secondary | ICD-10-CM

## 2021-07-08 DIAGNOSIS — Z803 Family history of malignant neoplasm of breast: Secondary | ICD-10-CM

## 2021-07-08 DIAGNOSIS — E1159 Type 2 diabetes mellitus with other circulatory complications: Secondary | ICD-10-CM | POA: Diagnosis present

## 2021-07-08 DIAGNOSIS — Z86010 Personal history of colonic polyps: Secondary | ICD-10-CM | POA: Diagnosis not present

## 2021-07-08 DIAGNOSIS — Z79899 Other long term (current) drug therapy: Secondary | ICD-10-CM

## 2021-07-08 DIAGNOSIS — Z20822 Contact with and (suspected) exposure to covid-19: Secondary | ICD-10-CM | POA: Diagnosis present

## 2021-07-08 DIAGNOSIS — K317 Polyp of stomach and duodenum: Secondary | ICD-10-CM | POA: Diagnosis present

## 2021-07-08 DIAGNOSIS — E611 Iron deficiency: Secondary | ICD-10-CM | POA: Diagnosis present

## 2021-07-08 DIAGNOSIS — K921 Melena: Secondary | ICD-10-CM

## 2021-07-08 LAB — CBC
HCT: 41.8 % (ref 36.0–46.0)
Hemoglobin: 12.8 g/dL (ref 12.0–15.0)
MCH: 22.1 pg — ABNORMAL LOW (ref 26.0–34.0)
MCHC: 30.6 g/dL (ref 30.0–36.0)
MCV: 72.1 fL — ABNORMAL LOW (ref 80.0–100.0)
Platelets: 387 10*3/uL (ref 150–400)
RBC: 5.8 MIL/uL — ABNORMAL HIGH (ref 3.87–5.11)
RDW: 17.2 % — ABNORMAL HIGH (ref 11.5–15.5)
WBC: 11.4 10*3/uL — ABNORMAL HIGH (ref 4.0–10.5)
nRBC: 0 % (ref 0.0–0.2)

## 2021-07-08 LAB — COMPREHENSIVE METABOLIC PANEL
ALT: 14 U/L (ref 0–44)
AST: 16 U/L (ref 15–41)
Albumin: 4.5 g/dL (ref 3.5–5.0)
Alkaline Phosphatase: 50 U/L (ref 38–126)
Anion gap: 8 (ref 5–15)
BUN: 22 mg/dL (ref 8–23)
CO2: 27 mmol/L (ref 22–32)
Calcium: 10.1 mg/dL (ref 8.9–10.3)
Chloride: 105 mmol/L (ref 98–111)
Creatinine, Ser: 1.08 mg/dL — ABNORMAL HIGH (ref 0.44–1.00)
GFR, Estimated: 58 mL/min — ABNORMAL LOW (ref >60)
Glucose, Bld: 171 mg/dL — ABNORMAL HIGH (ref 70–99)
Potassium: 4.5 mmol/L (ref 3.5–5.1)
Sodium: 140 mmol/L (ref 135–145)
Total Bilirubin: 0.4 mg/dL (ref 0.3–1.2)
Total Protein: 8.7 g/dL — ABNORMAL HIGH (ref 6.5–8.1)

## 2021-07-08 LAB — POC OCCULT BLOOD, ED: Fecal Occult Bld: POSITIVE — AB

## 2021-07-08 LAB — TYPE AND SCREEN
ABO/RH(D): O POS
Antibody Screen: NEGATIVE

## 2021-07-08 MED ORDER — ACETAMINOPHEN 650 MG RE SUPP: 650.0000 mg | Freq: Four times a day (QID) | RECTAL | Status: DC | PRN

## 2021-07-08 MED ORDER — ACETAMINOPHEN 325 MG PO TABS: 650.0000 mg | ORAL_TABLET | Freq: Four times a day (QID) | ORAL | Status: DC | PRN

## 2021-07-08 MED ORDER — INSULIN ASPART 100 UNIT/ML IJ SOLN
0.0000 [IU] | INTRAMUSCULAR | Status: DC
  Administered 2021-07-09: 2 [IU] via SUBCUTANEOUS
  Administered 2021-07-10: 1 [IU] via SUBCUTANEOUS
  Administered 2021-07-10: 2 [IU] via SUBCUTANEOUS
  Administered 2021-07-10 (×2): 1 [IU] via SUBCUTANEOUS
  Filled 2021-07-08: qty 0.09

## 2021-07-08 MED ORDER — SODIUM CHLORIDE 0.9 % IV SOLN: INTRAVENOUS | Status: AC

## 2021-07-08 MED ORDER — IOHEXOL 350 MG/ML SOLN
100.0000 mL | Freq: Once | INTRAVENOUS | Status: AC | PRN
  Administered 2021-07-08: 100 mL via INTRAVENOUS

## 2021-07-08 NOTE — ED Notes (Signed)
Pt ambulatory in triage. 

## 2021-07-08 NOTE — ED Triage Notes (Signed)
Pt complains of GI bleed since 1630. Pt states that she has hx of GI bleeding and has spoken to her doctor. Pt reports copious amounts of dark blood in her stool.

## 2021-07-08 NOTE — H&P (Signed)
History and Physical    Carol Mueller [id number] DOB: Jun 30, 1961 DOA: 07/08/2021  PCP: Carol Morning, DO Patient coming from: Home  Chief Complaint: Bloody stools  HPI: Carol Mueller is a 60 y.o. 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  Patient states since 4:30 PM today she has had 5 large-volume bloody bowel movements.  She has felt some mild discomfort in the left lower quadrant of her abdomen.  She has not vomited blood.  She does not use any NSAIDs or blood thinners.  She has not felt dizzy but while in the emergency room she felt short of breath walking to the bathroom.  No shortness of breath at rest.  Denies chest pain.  Denies fevers.  She is fully vaccinated against COVID.  ED Course: Hemodynamically stable.  Hemoglobin 12.8.  Had grossly bloody stool on exam.  [facility] consulted via secure chat.  Review of Systems:  All systems reviewed and apart from history of presenting illness, are negative.  Past Medical History:  Diagnosis Date   Anemia    Cushing's syndrome ([place])    due to adrenal tumor   Diabetes mellitus without complication ([place]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   Diverticulitis    Diverticulosis    GI bleed    Hyperlipidemia    Hypertension    Polycystic ovary disease    Sepsis ([place]) 05/2019    Past Surgical History:  Procedure Laterality Date   ADRENALECTOMY  1996   BREAST BIOPSY Right 2018   BREAST BIOPSY Left    x2   CHOLECYSTECTOMY     COLONOSCOPY WITH PROPOFOL N/A 10/03/2019   Procedure: COLONOSCOPY WITH PROPOFOL;  Surgeon: Ladene Artist, MD;  Location: [facility];  Service: Endoscopy;  Laterality: N/A;   COLONOSCOPY WITH PROPOFOL N/A 11/22/2020   Procedure: COLONOSCOPY WITH PROPOFOL;  Surgeon: Ladene Artist, MD;   Location: [facility];  Service: Endoscopy;  Laterality: N/A;   ESOPHAGOGASTRODUODENOSCOPY (EGD) WITH PROPOFOL N/A 07/16/2015   Procedure: ESOPHAGOGASTRODUODENOSCOPY (EGD) WITH PROPOFOL;  Surgeon: [name], MD;  Location: [facility];  Service: Endoscopy;  Laterality: N/A;   ESOPHAGOGASTRODUODENOSCOPY (EGD) WITH PROPOFOL N/A 11/28/2020   Procedure: ESOPHAGOGASTRODUODENOSCOPY (EGD) WITH PROPOFOL;  Surgeon: Lavena Bullion, DO;  Location: [facility];  Service: Gastroenterology;  Laterality: N/A;   GIVENS CAPSULE STUDY N/A 11/28/2020   Procedure: GIVENS CAPSULE STUDY;  Surgeon: Lavena Bullion, DO;  Location: [facility];  Service: Gastroenterology;  Laterality: N/A;   POLYPECTOMY  10/03/2019   Procedure: POLYPECTOMY;  Surgeon: Ladene Artist, MD;  Location: [facility];  Service: Endoscopy;;   POLYPECTOMY  11/22/2020   Procedure: POLYPECTOMY;  Surgeon: Ladene Artist, MD;  Location: [facility];  Service: Endoscopy;;     reports that she has never smoked. She has never used smokeless tobacco. She reports that she does not drink alcohol and does not use drugs.  Home  Allergies  Allergen Reactions   Norvasc [Amlodipine Besylate] Swelling    ANGIOEDEMA   Nsaids Other (See Comments)    GI upset/ GI bleed   Sulfa Antibiotics Other (See Comments)    childhood    Family History  Problem Relation Age of Onset   Hypertension Mother    Cancer Mother        ?uterine or ovarian cancer   Hypertension Father  Diabetes Father    Hypertension Sister    Diabetes Sister    Hypertension Brother    Diabetes Brother    Hypertension Maternal Grandfather    Diabetes Paternal Grandmother    Breast cancer Maternal Grandmother    Colon cancer Neg Hx     Prior to Admission medications   Medication Sig Start Date End Date Taking? Authorizing Provider  acetaminophen (TYLENOL) 325 MG tablet Take 2 tablets (650 mg total) by mouth every 6 (six) hours as needed for mild pain (or Fever  >/= 101). Patient taking differently: Take 650 mg by mouth as needed for mild pain (or Fever >/= 101). 09/19/19   [name] I, MD  atorvastatin (LIPITOR) 10 MG tablet Take 10 mg by mouth daily. 03/06/19   [provider]  calcium citrate-vitamin D (CITRACAL+D) 315-200 MG-UNIT tablet Take 1 tablet by mouth daily.    [provider]  carvedilol (COREG) 25 MG tablet Take 25 mg by mouth 2 (two) times daily. 10/30/20   [provider]  dicyclomine (BENTYL) 20 MG tablet Take 1 tablet (20 mg total) by mouth 2 (two) times daily as needed for spasms. 11/23/20   Dwyane Dee, MD  empagliflozin (JARDIANCE) 10 MG TABS tablet Take 10 mg by mouth daily.    [provider]  glimepiride (AMARYL) 2 MG tablet Take 2 mg by mouth daily. 02/23/20   [provider]  Insulin Glargine (BASAGLAR KWIKPEN) 100 UNIT/ML Inject 40 Units into the skin at bedtime. 02/20/20   [provider]  metFORMIN (GLUCOPHAGE) 1000 MG tablet Take 500 mg by mouth 2 (two) times daily with a meal.  01/11/20   [provider]  Multiple Vitamins-Minerals (MULTIVITAMIN WITH MINERALS) tablet Take 1 tablet by mouth daily.    [provider]  olmesartan (BENICAR) 40 MG tablet Take 40 mg by mouth daily. 10/11/19   [provider]  pantoprazole (PROTONIX) 40 MG tablet Take 1 tablet (40 mg total) by mouth daily. 11/23/20 11/23/21  Dwyane Dee, MD  Potassium Chloride ER 20 MEQ TBCR Take 20 mEq by mouth daily. 10/30/20   [provider]  spironolactone (ALDACTONE) 50 MG tablet Take 50 mg by mouth daily.    [provider]    Physical Exam: Vitals:   07/08/21 2033  BP: (!) 149/83  Pulse: 86  Resp: 18  Temp: 97.9 F (36.6 C)  TempSrc: Oral  SpO2: 95%  Height: '5\' 3"'$  (1.6 m)    Physical Exam Constitutional:      General: She is not in acute distress. HENT:     Head: Normocephalic and atraumatic.  Eyes:     Extraocular Movements: Extraocular  movements intact.     Conjunctiva/sclera: Conjunctivae normal.  Cardiovascular:     Rate and Rhythm: Normal rate and regular rhythm.     Pulses: Normal pulses.  Pulmonary:     Effort: Pulmonary effort is normal. No respiratory distress.     Breath sounds: Normal breath sounds. No wheezing or rales.  Abdominal:     General: Bowel sounds are normal. There is no distension.     Palpations: Abdomen is soft.     Tenderness: There is no abdominal tenderness. There is no guarding or rebound.  Musculoskeletal:        General: No swelling or tenderness.     Cervical back: Normal range of motion and neck supple.  Skin:    General: Skin is warm and dry.  Neurological:     General:  No focal deficit present.     Mental Status: She is alert and oriented to person, place, and time.     Labs on Admission: I have personally reviewed following labs and imaging studies  CBC: Recent Labs  Lab 07/08/21 2051  WBC 11.4*  HGB 12.8  HCT 41.8  MCV 72.1*  PLT [id number]   Basic Metabolic Panel: Recent Labs  Lab 07/08/21 2051  NA 140  K 4.5  CL 105  CO2 27  GLUCOSE 171*  BUN 22  CREATININE 1.08*  CALCIUM 10.1   GFR: CrCl cannot be calculated (Unknown ideal weight.). Liver Function Tests: Recent Labs  Lab 07/08/21 2051  AST 16  ALT 14  ALKPHOS 50  BILITOT 0.4  PROT 8.7*  ALBUMIN 4.5   No results for input(s): LIPASE, AMYLASE in the last 168 hours. No results for input(s): AMMONIA in the last 168 hours. Coagulation Profile: No results for input(s): INR, PROTIME in the last 168 hours. Cardiac Enzymes: No results for input(s): CKTOTAL, CKMB, CKMBINDEX, TROPONINI in the last 168 hours. BNP (last 3 results) No results for input(s): PROBNP in the last 8760 hours. HbA1C: No results for input(s): HGBA1C in the last 72 hours. CBG: No results for input(s): GLUCAP in the last 168 hours. Lipid Profile: No results for input(s): CHOL, HDL, LDLCALC, TRIG, CHOLHDL, LDLDIRECT in the last 72  hours. Thyroid Function Tests: No results for input(s): TSH, T4TOTAL, FREET4, T3FREE, THYROIDAB in the last 72 hours. Anemia Panel: No results for input(s): VITAMINB12, FOLATE, FERRITIN, TIBC, IRON, RETICCTPCT in the last 72 hours. Urine analysis:    Component Value Date/Time   COLORURINE YELLOW 10/25/2020 1109   APPEARANCEUR CLEAR 10/25/2020 1109   LABSPEC 1.018 10/25/2020 1109   PHURINE 5.0 10/25/2020 1109   GLUCOSEU >=500 (A) 10/25/2020 1109   HGBUR NEGATIVE 10/25/2020 1109   [place] 10/25/2020 1109   BILIRUBINUR neg 08/25/2014 1157   KETONESUR 20 (A) 10/25/2020 1109   PROTEINUR NEGATIVE 10/25/2020 1109   UROBILINOGEN 0.2 08/25/2014 1157   NITRITE NEGATIVE 10/25/2020 1109   LEUKOCYTESUR NEGATIVE 10/25/2020 1109    Radiological Exams on Admission: No results found.  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 -During prior hospital admission in January 2022, she underwent colonoscopy and 2 polyps were removed, pandiverticulosis noted.  Also underwent EGD with capsule study which revealed polyps in the stomach and no clear source of bleeding. -Hemodynamically stable -Hemoglobin stable -Not on any anticoagulant or antiplatelet agent -Stat CT angiogram abdomen/pelvis -2 large-bore IVs -IV fluid hydration -Keep n.p.o. -Serial H&H -Transfuse if hemoglobin less than 7 -[facility] consulted via secure chat.  Insulin-dependent type 2 diabetes -Check A1c.  Sliding scale insulin sensitive every 4 hours as patient is currently n.p.o.  Hypertension -Hold antihypertensives at this time.  Hyperlipidemia -Resume statin after pharmacy med rec is done.  DVT prophylaxis: SCDs Code Status: Full code Family Communication: No  family available at this time. Consults: Dr. Silverio Decamp from [facility] Disposition Plan: Status is: Inpatient  Remains inpatient appropriate because:Inpatient level of care appropriate due to severity of illness  Dispo: The patient is from: Home              Anticipated d/c is to: Home  Patient currently is not medically stable to d/c.   Difficult to place patient No  Level of care: Level of care: Telemetry  The medical decision making on this patient was of high complexity and the patient is at high risk for clinical deterioration, therefore this is a level 3 visit.  Shela Leff MD [facility]  If 7PM-7AM, please contact night-coverage [email or web address]  07/08/2021, 10:38 PM

## 2021-07-09 DIAGNOSIS — K922 Gastrointestinal hemorrhage, unspecified: Secondary | ICD-10-CM

## 2021-07-09 DIAGNOSIS — K5731 Diverticulosis of large intestine without perforation or abscess with bleeding: Secondary | ICD-10-CM | POA: Diagnosis present

## 2021-07-09 DIAGNOSIS — D62 Acute posthemorrhagic anemia: Secondary | ICD-10-CM

## 2021-07-09 LAB — HEMOGLOBIN AND HEMATOCRIT, BLOOD
HCT: 30.1 % — ABNORMAL LOW (ref 36.0–46.0)
HCT: 34.5 % — ABNORMAL LOW (ref 36.0–46.0)
HCT: 37.5 % (ref 36.0–46.0)
HCT: 36.8 % (ref 36.0–46.0)
Hemoglobin: 9.1 g/dL — ABNORMAL LOW (ref 12.0–15.0)
Hemoglobin: 10.6 g/dL — ABNORMAL LOW (ref 12.0–15.0)
Hemoglobin: 11.4 g/dL — ABNORMAL LOW (ref 12.0–15.0)
Hemoglobin: 11.1 g/dL — ABNORMAL LOW (ref 12.0–15.0)

## 2021-07-09 LAB — SARS CORONAVIRUS 2 (TAT 6-24 HRS): SARS Coronavirus 2: NEGATIVE

## 2021-07-09 LAB — BASIC METABOLIC PANEL
Anion gap: 7 (ref 5–15)
BUN: 20 mg/dL (ref 8–23)
CO2: 27 mmol/L (ref 22–32)
Calcium: 9.5 mg/dL (ref 8.9–10.3)
Chloride: 102 mmol/L (ref 98–111)
Creatinine, Ser: 0.88 mg/dL (ref 0.44–1.00)
GFR, Estimated: >60 mL/min (ref >60)
Glucose, Bld: 131 mg/dL — ABNORMAL HIGH (ref 70–99)
Potassium: 4 mmol/L (ref 3.5–5.1)
Sodium: 136 mmol/L (ref 135–145)

## 2021-07-09 LAB — CBG MONITORING, ED
Glucose-Capillary: 101 mg/dL — ABNORMAL HIGH (ref 70–99)
Glucose-Capillary: 102 mg/dL — ABNORMAL HIGH (ref 70–99)
Glucose-Capillary: 159 mg/dL — ABNORMAL HIGH (ref 70–99)
Glucose-Capillary: 82 mg/dL (ref 70–99)

## 2021-07-09 LAB — GLUCOSE, CAPILLARY
Glucose-Capillary: 103 mg/dL — ABNORMAL HIGH (ref 70–99)
Glucose-Capillary: 111 mg/dL — ABNORMAL HIGH (ref 70–99)

## 2021-07-09 LAB — HEMOGLOBIN A1C
Hgb A1c MFr Bld: 8.1 % — ABNORMAL HIGH (ref 4.8–5.6)
Mean Plasma Glucose: 185.77 mg/dL

## 2021-07-09 MED ORDER — INSULIN GLARGINE-YFGN 100 UNIT/ML ~~LOC~~ SOLN
40.0000 [IU] | Freq: Every day | SUBCUTANEOUS | Status: DC
  Filled 2021-07-09: qty 0.4

## 2021-07-09 MED ORDER — INSULIN GLARGINE-YFGN 100 UNIT/ML ~~LOC~~ SOLN
20.0000 [IU] | Freq: Every day | SUBCUTANEOUS | Status: DC
  Administered 2021-07-09: 20 [IU] via SUBCUTANEOUS
  Filled 2021-07-09 (×2): qty 0.2

## 2021-07-09 MED ORDER — IRBESARTAN 75 MG PO TABS
37.5000 mg | ORAL_TABLET | Freq: Every day | ORAL | Status: DC
  Administered 2021-07-09 – 2021-07-10 (×2): 37.5 mg via ORAL
  Filled 2021-07-09: qty 0.5
  Filled 2021-07-09: qty 1

## 2021-07-09 MED ORDER — CARVEDILOL 25 MG PO TABS
25.0000 mg | ORAL_TABLET | Freq: Two times a day (BID) | ORAL | Status: DC
  Administered 2021-07-09 – 2021-07-10 (×2): 25 mg via ORAL
  Filled 2021-07-09 (×2): qty 1
  Filled 2021-07-09: qty 2

## 2021-07-09 MED ORDER — BASAGLAR KWIKPEN 100 UNIT/ML ~~LOC~~ SOPN: 40.0000 [IU] | PEN_INJECTOR | Freq: Every day | SUBCUTANEOUS | Status: DC

## 2021-07-09 MED ORDER — PANTOPRAZOLE SODIUM 40 MG PO TBEC
40.0000 mg | DELAYED_RELEASE_TABLET | Freq: Every day | ORAL | Status: DC
  Administered 2021-07-09 – 2021-07-10 (×2): 40 mg via ORAL
  Filled 2021-07-09 (×2): qty 1

## 2021-07-09 MED ORDER — ATORVASTATIN CALCIUM 10 MG PO TABS
10.0000 mg | ORAL_TABLET | Freq: Every day | ORAL | Status: DC
  Administered 2021-07-09 – 2021-07-10 (×2): 10 mg via ORAL
  Filled 2021-07-09 (×2): qty 1

## 2021-07-09 MED ORDER — SPIRONOLACTONE 25 MG PO TABS
50.0000 mg | ORAL_TABLET | Freq: Every day | ORAL | Status: DC
  Administered 2021-07-09 – 2021-07-10 (×2): 50 mg via ORAL
  Filled 2021-07-09 (×2): qty 2

## 2021-07-09 MED ORDER — FERROUS SULFATE 325 (65 FE) MG PO TABS
325.0000 mg | ORAL_TABLET | Freq: Two times a day (BID) | ORAL | Status: DC
  Administered 2021-07-10 (×2): 325 mg via ORAL
  Filled 2021-07-09 (×2): qty 1

## 2021-07-09 NOTE — ED Provider Notes (Signed)
[facility] EAST GENERAL SURGERY Provider Note   CSN: [id number] Arrival date & time: 07/08/21  2016     History Chief Complaint  Patient presents with   GI Bleeding    Carol Mueller is a 60 y.o. female.  HPI     60 year old female with history of Cushing syndrome, diverticulosis, lower GI bleed, polycystic ovarian disease comes in with chief complaint of hematochezia.  Her current symptoms started earlier today.  She had 3-4 large volume bloody stools.  Stool was bright red followed by dark.  No melena however.  She denies any abdominal pain.  Patient does not take any blood thinners.  Past Medical History:  Diagnosis Date   Anemia    Cushing's syndrome ([place])    due to adrenal tumor   Diabetes mellitus without complication ([place]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   Diverticulitis    Diverticulosis    GI bleed    Hyperlipidemia    Hypertension    Polycystic ovary disease    Sepsis ([place]) 05/2019    Patient Active Problem List   Diagnosis Date Noted   Diverticular hemorrhage 07/09/2021   Hyperglycemia due to type 2 diabetes mellitus ([place]) 12/11/2020   Gastric inlet patch of esophagus    Multiple gastric polyps    Melena    Anemia, blood loss 11/24/2020   Benign neoplasm of appendix    GERD (gastroesophageal reflux disease) 11/21/2020   SIRS (systemic inflammatory response syndrome) ([place]) 10/23/2020   Acute GI bleeding 10/23/2020   GIB (gastrointestinal bleeding) 09/23/2020   Hematochezia 10/03/2019   Benign neoplasm of transverse colon    Benign neoplasm of descending colon    GI bleed 10/02/2019   Rectal bleeding 09/18/2019   Fever    Salmonella enteritis 05/28/2019   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   Sepsis ([employer]) 05/24/2019   Hypokalemia 06/02/2018   Lower GI bleeding 06/02/2018   Insulin-requiring or dependent type II diabetes mellitus (HCC)    Back pain with left-sided radiculopathy 09/29/2017   Diarrhea 12/16/2016   Lower GI bleed 11/30/2016   History  of GI diverticular bleed    Diverticulosis of colon with hemorrhage 06/05/2016   Ischemic colitis ([place]) 08/07/2015   Abdominal pain    Tachycardia 07/15/2015   Diabetes mellitus ([place]) 09/12/2012   Polycystic disease, ovaries 09/12/2012   Class 3 obesity due to excess calories with body mass index (BMI) of 40.0 to 44.9 in adult 09/12/2012   Essential hypertension 09/12/2012   Hyperlipidemia with target LDL less than 100 09/12/2012   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 ([place]) 09/12/2012   Allergic rhinitis 04/06/2012    Past Surgical History:  Procedure Laterality Date   ADRENALECTOMY  1996   BREAST BIOPSY Right 2018   BREAST BIOPSY Left    x2   CHOLECYSTECTOMY     COLONOSCOPY WITH PROPOFOL N/A 10/03/2019   Procedure: COLONOSCOPY WITH PROPOFOL;  Surgeon: Ladene Artist, MD;  Location: [facility];  Service: Endoscopy;  Laterality: N/A;   COLONOSCOPY WITH PROPOFOL N/A 11/22/2020   Procedure: COLONOSCOPY WITH PROPOFOL;  Surgeon: Ladene Artist, MD;  Location: [facility];  Service: Endoscopy;  Laterality: N/A;   ESOPHAGOGASTRODUODENOSCOPY (EGD) WITH PROPOFOL N/A 07/16/2015   Procedure: ESOPHAGOGASTRODUODENOSCOPY (EGD) WITH PROPOFOL;  Surgeon: [name], MD;  Location: [facility];  Service: Endoscopy;  Laterality: N/A;   ESOPHAGOGASTRODUODENOSCOPY (EGD) WITH PROPOFOL N/A 11/28/2020   Procedure: ESOPHAGOGASTRODUODENOSCOPY (EGD) WITH PROPOFOL;  Surgeon: Lavena Bullion, DO;  Location: [facility];  Service: Gastroenterology;  Laterality: N/A;   GIVENS CAPSULE STUDY N/A 11/28/2020   Procedure: GIVENS CAPSULE STUDY;  Surgeon: Lavena Bullion, DO;  Location: [facility];  Service: Gastroenterology;  Laterality: N/A;   POLYPECTOMY  10/03/2019   Procedure: POLYPECTOMY;  Surgeon: Ladene Artist, MD;  Location: [facility];  Service: Endoscopy;;   POLYPECTOMY  11/22/2020   Procedure: POLYPECTOMY;  Surgeon: Ladene Artist, MD;  Location: [facility];  Service: Endoscopy;;      OB History   No obstetric history on file.     Family History  Problem Relation Age of Onset   Hypertension Mother    Cancer Mother        ?uterine or ovarian cancer   Hypertension Father    Diabetes Father    Hypertension Sister    Diabetes Sister    Hypertension Brother    Diabetes Brother    Hypertension Maternal Grandfather    Diabetes Paternal Grandmother    Breast cancer Maternal Grandmother    Colon cancer Neg Hx     Social History   Tobacco Use   Smoking status: Never   Smokeless tobacco: Never  Vaping Use   Vaping Use: Never used  Substance Use Topics   Alcohol use: No   Drug use: No    Home Medications Prior to Admission medications   Medication Sig Start Date End Date Taking? Authorizing Provider  acetaminophen (TYLENOL) 325 MG tablet Take 2 tablets (650 mg total) by mouth every 6 (six) hours as needed for mild pain (or Fever >/= 101). Patient taking differently: Take 650 mg by mouth as needed for mild pain (or Fever >/= 101). 09/19/19  Yes [name] I, MD  atorvastatin (LIPITOR) 10 MG tablet Take 10 mg by mouth daily. 03/06/19  Yes [provider]  calcium citrate-vitamin D (CITRACAL+D) 315-200 MG-UNIT tablet Take 1 tablet by mouth daily.   Yes [provider]  carvedilol (COREG) 25 MG tablet Take 25 mg by mouth 2 (two) times daily. 10/30/20  Yes [provider]  dicyclomine (BENTYL) 20 MG tablet Take 1 tablet (20 mg total) by mouth 2 (two) times daily as needed for spasms. 11/23/20  Yes Dwyane Dee, MD  empagliflozin (JARDIANCE) 10 MG TABS tablet Take 10 mg by mouth daily.   Yes [provider]  glimepiride (AMARYL) 2 MG tablet Take 2 mg by mouth daily. 02/23/20  Yes [provider]  Insulin Glargine (BASAGLAR KWIKPEN) 100 UNIT/ML Inject 40 Units into the skin at bedtime. 02/20/20  Yes [provider]  Multiple Vitamins-Minerals (MULTIVITAMIN WITH MINERALS) tablet Take 1 tablet by mouth daily.    Yes [provider]  olmesartan (BENICAR) 40 MG tablet Take 40 mg by mouth daily. 10/11/19  Yes [provider]  pantoprazole (PROTONIX) 40 MG tablet Take 1 tablet (40 mg total) by mouth daily. 11/23/20 11/23/21 Yes Dwyane Dee, MD  Potassium Chloride ER 20 MEQ TBCR Take 20 mEq by mouth daily. 10/30/20  Yes [provider]  spironolactone (ALDACTONE) 50 MG tablet Take 50 mg by mouth daily.   Yes [provider]    Allergies    Norvasc [amlodipine besylate], Nsaids, and Sulfa antibiotics  Review of Systems   Review of Systems  Constitutional:  Positive for activity change.  Respiratory:  Negative for shortness of breath.   Cardiovascular:  Negative for chest pain.  Gastrointestinal:  Positive for anal bleeding. Negative for abdominal pain.  Neurological:  Negative for dizziness.  Hematological:  Does not bruise/bleed easily.  All other systems reviewed and are negative.  Physical Exam Updated Vital Signs BP (!) 152/87 (BP Location: Right Arm)   Pulse 72   Temp 98.3 F (36.8 C)   Resp 18   Ht '5\' 3"'$  (1.6 m)   Wt 115.7 kg   SpO2 96%   BMI 45.17 kg/m   Physical Exam Vitals and nursing note reviewed.  Constitutional:      Appearance: She is well-developed.  HENT:     Head: Atraumatic.  Cardiovascular:     Rate and Rhythm: Normal rate.  Pulmonary:     Effort: Pulmonary effort is normal.  Abdominal:     Tenderness: There is no abdominal tenderness.     Comments: Gross bloody stools on rectal exam  Musculoskeletal:     Cervical back: Normal range of motion and neck supple.  Skin:    General: Skin is warm and dry.  Neurological:     Mental Status: She is alert and oriented to person, place, and time.    ED Results / Procedures / Treatments   Labs (all labs ordered are listed, but only abnormal results are displayed) Labs Reviewed  COMPREHENSIVE METABOLIC PANEL - Abnormal; Notable for the following components:      Result Value    Glucose, Bld 171 (*)    Creatinine, Ser 1.08 (*)    Total Protein 8.7 (*)    GFR, Estimated 58 (*)    All other components within normal limits  CBC - Abnormal; Notable for the following components:   WBC 11.4 (*)    RBC 5.80 (*)    MCV 72.1 (*)    MCH 22.1 (*)    RDW 17.2 (*)    All other components within normal limits  HEMOGLOBIN AND HEMATOCRIT, BLOOD - Abnormal; Notable for the following components:   Hemoglobin 9.1 (*)    HCT 30.1 (*)    All other components within normal limits  HEMOGLOBIN AND HEMATOCRIT, BLOOD - Abnormal; Notable for the following components:   Hemoglobin 10.6 (*)    HCT 34.5 (*)    All other components within normal limits  HEMOGLOBIN A1C - Abnormal; Notable for the following components:   Hgb A1c MFr Bld 8.1 (*)    All other components within normal limits  HEMOGLOBIN AND HEMATOCRIT, BLOOD - Abnormal; Notable for the following components:   Hemoglobin 11.4 (*)    All other components within normal limits  GLUCOSE, CAPILLARY - Abnormal; Notable for the following components:   Glucose-Capillary 103 (*)    All other components within normal limits  POC OCCULT BLOOD, ED - Abnormal; Notable for the following components:   Fecal Occult Bld POSITIVE (*)    All other components within normal limits  CBG MONITORING, ED - Abnormal; Notable for the following components:   Glucose-Capillary 159 (*)    All other components within normal limits  CBG MONITORING, ED - Abnormal; Notable for the following components:   Glucose-Capillary 102 (*)    All other components within normal limits  CBG MONITORING, ED - Abnormal; Notable for the following components:   Glucose-Capillary 101 (*)    All other components within normal limits  SARS CORONAVIRUS 2 (TAT 6-24 HRS)  HEMOGLOBIN AND HEMATOCRIT, BLOOD  BASIC METABOLIC PANEL  HEMOGLOBIN AND HEMATOCRIT, BLOOD  CBG MONITORING, ED  TYPE AND SCREEN    EKG None  Radiology CT Angio Abd/Pel w/ and/or w/o  Result  Date: 07/08/2021 CLINICAL DATA:  GI  bleed since 1630. Previous history of GI bleeding. Dark blood per stool. EXAM: CTA ABDOMEN AND PELVIS WITHOUT AND WITH CONTRAST TECHNIQUE: Multidetector CT imaging of the abdomen and pelvis was performed using the standard protocol during bolus administration of intravenous contrast. Multiplanar reconstructed images and MIPs were obtained and reviewed to evaluate the vascular anatomy. CONTRAST:  117m OMNIPAQUE IOHEXOL 350 MG/ML SOLN COMPARISON:  Radionuclide bleeding scan 11/26/2020.  CT 11/23/2020 FINDINGS: VASCULAR Aorta: Unenhanced images are obtained followed by arterial phase and delayed phase imaging. The unenhanced images demonstrate surgical clips in the right upper quadrant consistent with adrenalectomy. Multiple calcified uterine fibroids. No significant vascular calcifications. Normal caliber aorta. No aneurysm or dissection. No significant stenosis. Celiac: Patent without evidence of aneurysm, dissection, vasculitis or significant stenosis. SMA: Patent without evidence of aneurysm, dissection, vasculitis or significant stenosis. Renals: Both renal arteries are patent without evidence of aneurysm, dissection, vasculitis, fibromuscular dysplasia or significant stenosis. Small accessory inferior right renal artery. IMA: Patent without evidence of aneurysm, dissection, vasculitis or significant stenosis. Inflow: Patent without evidence of aneurysm, dissection, vasculitis or significant stenosis. Proximal Outflow: Bilateral common femoral and visualized portions of the superficial and profunda femoral arteries are patent without evidence of aneurysm, dissection, vasculitis or significant stenosis. Veins: No obvious venous abnormality within the limitations of this arterial phase study. Review of the MIP images confirms the above findings. NON-VASCULAR Lower chest: Lung bases are clear. Hepatobiliary: No focal liver abnormality is seen. Status post cholecystectomy. No  biliary dilatation. Pancreas: Unremarkable. No pancreatic ductal dilatation or surrounding inflammatory changes. Spleen: Normal in size without focal abnormality. Adrenals/Urinary Tract: Surgical absence of the right adrenal gland. Left adrenal gland is normal. Cysts in both kidneys. No hydronephrosis or hydroureter. Bladder is unremarkable. Stomach/Bowel: The stomach, small bowel, and colon are mostly decompressed. Scattered stool throughout the colon. Scattered colonic diverticula without inflammatory change. Appendix is normal. No intraluminal contrast extravasation is demonstrated. No focal areas of wall thickening or mass are appreciated. Lymphatic: No significant lymphadenopathy. Reproductive: Prominent nodular enlargement of the uterus with uterine calcifications consistent with multiple uterine fibroids. No abnormal adnexal masses. Other: No free air or free fluid in the abdomen. Minimal left inguinal hernia containing fat. Musculoskeletal: No acute or significant osseous findings. IMPRESSION: VASCULAR No significant aneurysm or vascular occlusion demonstrated. NON-VASCULAR 1. No gastrointestinal bleeding site identified. 2. Multiple calcified uterine fibroids. Electronically Signed   By: WLucienne CapersM.D.   On: 07/08/2021 23:14    Procedures Procedures   Medications Ordered in ED Medications  acetaminophen (TYLENOL) tablet 650 mg (has no administration in time range)    Or  acetaminophen (TYLENOL) suppository 650 mg (has no administration in time range)  0.9 %  sodium chloride infusion ( Intravenous Not Given 07/09/21 1146)  insulin aspart (novoLOG) injection 0-9 Units (0 Units Subcutaneous Not Given 07/09/21 1535)  atorvastatin (LIPITOR) tablet 10 mg (10 mg Oral Given 07/09/21 1133)  carvedilol (COREG) tablet 25 mg (25 mg Oral Patient Refused/Not Given 07/09/21 1132)  irbesartan (AVAPRO) tablet 37.5 mg (37.5 mg Oral Given 07/09/21 1134)  pantoprazole (PROTONIX) EC tablet 40 mg (40 mg Oral  Given 07/09/21 1133)  spironolactone (ALDACTONE) tablet 50 mg (50 mg Oral Given 07/09/21 1132)  ferrous sulfate tablet 325 mg (has no administration in time range)  iohexol (OMNIPAQUE) 350 MG/ML injection 100 mL (100 mLs Intravenous Contrast Given 07/08/21 2244)    ED Course  I have reviewed the triage vital signs and the nursing notes.  Pertinent labs & imaging results  that were available during my care of the patient were reviewed by me and considered in my medical decision making (see chart for details).    MDM Rules/Calculators/A&P                           60 year old female with history of diverticulosis, internal hemorrhoids comes in a chief complaint of painless abdominal bleed.  Patient is hemodynamically stable.  She is not on any anticoagulation.  Grossly bloody stool on DRE.  Given that she has had large-volume blood loss in the past, is presenting with similar type of heavy bleed at this time and she has required blood transfusion in the past, we will admit her for further monitoring.  Final Clinical Impression(s) / ED Diagnoses Final diagnoses:  Hematochezia  Lower GI bleed    Rx / DC Orders ED Discharge Orders     None        Varney Biles, MD 07/09/21 [id number]

## 2021-07-09 NOTE — Consult Note (Signed)
Consultation  Referring Provider: TRH/Chui  Primary Care Physician:  [name], DO Primary Gastroenterologist:  [name]  Reason for Consultation: GI bleeding  HPI: Carol Mueller is a 60 y.o. female, known to Dr. [name] with history of recurrent diverticular bleeds.  She has had 6-7 episodes over the past 10+ years. Patient presented to the emergency room last evening after she had acute onset of dark red rectal bleeding at about 4:30 PM yesterday.  She initially stated home because she says this was fairly small volume then about 7 PM last night had a larger volume maroonish stool and came to the emergency room.  In total she had about 5 episodes and says the last episode was around 9 PM.  She is continuing to have some mild abdominal cramping off and on this morning but has not had any further bowel movements.  CTA was done last night with no bleeding source identified, she was noted to have a nodular uterus with multiple fibroids. Hemoglobin on admission 12.8, down to 10.6 early this a.m.  She has remained hemodynamically stable and has not required any transfusions.  Patient is not on any aspirin or NSAIDs. She does have history of adenomatous colon polyps and has had several colonoscopies.  Last colonoscopy was done during admission for bleeding in January 2022 with removal of 2 small polyps which were benign colonic mucosa and noted to have pandiverticulosis. Because of the recurrent episodes of bleeding she also had EGD with placement of small bowel capsule during that admission.  Capsule endoscopy was negative other than 1 very small subtle erosion in the distal small bowel.  Iron studies April 2022 serum iron 26/TIBC 436/sat 6/ferritin 5   Other medical issues include adult onset diabetes mellitus, PCOS, history of unilateral adrenalectomy /Cushing syndrome, hyperlipidemia and hypertension.  Potential surgery has been discussed with the patient in the past however this  would require a total colectomy as she has pandiverticulosis.    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   Diabetes mellitus without complication ([place]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   Diverticulitis    Diverticulosis    GI bleed    Hyperlipidemia    Hypertension    Polycystic ovary disease    Sepsis ([place]) 05/2019    Past Surgical History:  Procedure Laterality Date   ADRENALECTOMY  1996   BREAST BIOPSY Right 2018   BREAST BIOPSY Left    x2   CHOLECYSTECTOMY     COLONOSCOPY WITH PROPOFOL N/A 10/03/2019   Procedure: COLONOSCOPY WITH PROPOFOL;  Surgeon: Ladene Artist, MD;  Location: [facility];  Service: Endoscopy;  Laterality: N/A;   COLONOSCOPY WITH PROPOFOL N/A 11/22/2020   Procedure: COLONOSCOPY WITH PROPOFOL;  Surgeon: Ladene Artist, MD;  Location: [facility];  Service: Endoscopy;  Laterality: N/A;   ESOPHAGOGASTRODUODENOSCOPY (EGD) WITH PROPOFOL N/A 07/16/2015   Procedure: ESOPHAGOGASTRODUODENOSCOPY (EGD) WITH PROPOFOL;  Surgeon: [name], MD;  Location: [facility];  Service: Endoscopy;  Laterality: N/A;   ESOPHAGOGASTRODUODENOSCOPY (EGD) WITH PROPOFOL N/A 11/28/2020   Procedure: ESOPHAGOGASTRODUODENOSCOPY (EGD) WITH PROPOFOL;  Surgeon: Lavena Bullion, DO;  Location: [facility];  Service: Gastroenterology;  Laterality: N/A;   GIVENS CAPSULE STUDY N/A 11/28/2020   Procedure: GIVENS CAPSULE STUDY;  Surgeon: Lavena Bullion, DO;  Location: [facility];  Service: Gastroenterology;  Laterality: N/A;   POLYPECTOMY  10/03/2019   Procedure: POLYPECTOMY;  Surgeon: Fuller Plan,  Pricilla Riffle, MD;  Location: [facility];  Service: Endoscopy;;   POLYPECTOMY  11/22/2020   Procedure: POLYPECTOMY;  Surgeon: Ladene Artist, MD;  Location: [facility];  Service: Endoscopy;;    Prior to Admission medications   Medication Sig Start Date End Date Taking? Authorizing Provider  acetaminophen (TYLENOL) 325 MG tablet Take 2  tablets (650 mg total) by mouth every 6 (six) hours as needed for mild pain (or Fever >/= 101). Patient taking differently: Take 650 mg by mouth as needed for mild pain (or Fever >/= 101). 09/19/19  Yes [name] I, MD  atorvastatin (LIPITOR) 10 MG tablet Take 10 mg by mouth daily. 03/06/19  Yes [provider]  calcium citrate-vitamin D (CITRACAL+D) 315-200 MG-UNIT tablet Take 1 tablet by mouth daily.   Yes [provider]  carvedilol (COREG) 25 MG tablet Take 25 mg by mouth 2 (two) times daily. 10/30/20  Yes [provider]  dicyclomine (BENTYL) 20 MG tablet Take 1 tablet (20 mg total) by mouth 2 (two) times daily as needed for spasms. 11/23/20  Yes Dwyane Dee, MD  empagliflozin (JARDIANCE) 10 MG TABS tablet Take 10 mg by mouth daily.   Yes [provider]  glimepiride (AMARYL) 2 MG tablet Take 2 mg by mouth daily. 02/23/20  Yes [provider]  Insulin Glargine (BASAGLAR KWIKPEN) 100 UNIT/ML Inject 40 Units into the skin at bedtime. 02/20/20  Yes [provider]  Multiple Vitamins-Minerals (MULTIVITAMIN WITH MINERALS) tablet Take 1 tablet by mouth daily.   Yes [provider]  olmesartan (BENICAR) 40 MG tablet Take 40 mg by mouth daily. 10/11/19  Yes [provider]  pantoprazole (PROTONIX) 40 MG tablet Take 1 tablet (40 mg total) by mouth daily. 11/23/20 11/23/21 Yes Dwyane Dee, MD  Potassium Chloride ER 20 MEQ TBCR Take 20 mEq by mouth daily. 10/30/20  Yes [provider]  spironolactone (ALDACTONE) 50 MG tablet Take 50 mg by mouth daily.   Yes [provider]    Current Facility-Administered Medications  Medication Dose Route Frequency Provider Last Rate Last Admin   0.9 %  sodium chloride infusion   Intravenous Continuous Shela Leff, MD 150 mL/hr at 07/09/21 0023 New Bag at 07/09/21 0023   acetaminophen (TYLENOL) tablet 650 mg  650 mg Oral Q6H PRN Shela Leff, MD       Or    acetaminophen (TYLENOL) suppository 650 mg  650 mg Rectal Q6H PRN Shela Leff, MD       atorvastatin (LIPITOR) tablet 10 mg  10 mg Oral Daily Donne Hazel, MD       carvedilol (COREG) tablet 25 mg  25 mg Oral BID Donne Hazel, MD       insulin aspart (novoLOG) injection 0-9 Units  0-9 Units Subcutaneous Q4H Shela Leff, MD   2 Units at 07/09/21 0020   irbesartan (AVAPRO) tablet 37.5 mg  37.5 mg Oral Daily Donne Hazel, MD       pantoprazole (PROTONIX) EC tablet 40 mg  40 mg Oral Daily Donne Hazel, MD       spironolactone (ALDACTONE) tablet 50 mg  50 mg Oral Daily Donne Hazel, MD       Current Outpatient Medications  Medication Sig Dispense Refill   acetaminophen (TYLENOL) 325 MG tablet Take 2 tablets (650 mg total) by mouth every 6 (six) hours as needed for mild pain (or Fever >/= 101). (Patient taking differently: Take 650 mg by mouth as needed for  mild pain (or Fever >/= 101).) 40 tablet 0   atorvastatin (LIPITOR) 10 MG tablet Take 10 mg by mouth daily.     calcium citrate-vitamin D (CITRACAL+D) 315-200 MG-UNIT tablet Take 1 tablet by mouth daily.     carvedilol (COREG) 25 MG tablet Take 25 mg by mouth 2 (two) times daily.     dicyclomine (BENTYL) 20 MG tablet Take 1 tablet (20 mg total) by mouth 2 (two) times daily as needed for spasms. 60 tablet 0   empagliflozin (JARDIANCE) 10 MG TABS tablet Take 10 mg by mouth daily.     glimepiride (AMARYL) 2 MG tablet Take 2 mg by mouth daily.     Insulin Glargine (BASAGLAR KWIKPEN) 100 UNIT/ML Inject 40 Units into the skin at bedtime.     Multiple Vitamins-Minerals (MULTIVITAMIN WITH MINERALS) tablet Take 1 tablet by mouth daily.     olmesartan (BENICAR) 40 MG tablet Take 40 mg by mouth daily.     pantoprazole (PROTONIX) 40 MG tablet Take 1 tablet (40 mg total) by mouth daily. 30 tablet 3   Potassium Chloride ER 20 MEQ TBCR Take 20 mEq by mouth daily.     spironolactone (ALDACTONE) 50 MG tablet Take 50 mg by mouth daily.       Allergies as of 07/08/2021 - Review Complete 07/08/2021  Allergen Reaction Noted   Norvasc [amlodipine besylate] Swelling 02/22/2012   Nsaids Other (See Comments) 08/07/2015   Sulfa antibiotics Other (See Comments) 02/22/2012    Family History  Problem Relation Age of Onset   Hypertension Mother    Cancer Mother        ?uterine or ovarian cancer   Hypertension Father    Diabetes Father    Hypertension Sister    Diabetes Sister    Hypertension Brother    Diabetes Brother    Hypertension Maternal Grandfather    Diabetes Paternal Grandmother    Breast cancer Maternal Grandmother    Colon cancer Neg Hx     Social History   Socioeconomic History   Marital status: Single    Spouse name: N/A   Number of children: 0   Years of education: Not on file   Highest education level: Not on file  Occupational History   Occupation: NURSE    Comment: [employer]  Tobacco Use   Smoking status: Never   Smokeless tobacco: Never  Vaping Use   Vaping Use: Never used  Substance and Sexual Activity   Alcohol use: No   Drug use: No   Sexual activity: Not Currently    Birth control/protection: Post-menopausal  Other Topics Concern   Not on file  Social History Narrative   Lives alone. Parents live in [place].   Social Determinants of Health   Financial Resource Strain: Not on file  Food Insecurity: Not on file  Transportation Needs: Not on file  Physical Activity: Not on file  Stress: Not on file  Social Connections: Not on file  Intimate Partner Violence: Not on file    Review of Systems: Pertinent positive and negative review of systems were noted in the above HPI section.  All other review of systems was otherwise negative.   Physical Exam: Vital signs in last 24 hours: Temp:  [97.9 F (36.6 C)] 97.9 F (36.6 C) (08/23 2033) Pulse Rate:  [69-87] 69 (08/24 0645) Resp:  [13-24] 20 (08/24 0645) BP: (85-149)/(53-83) 111/56 (08/24 0645) SpO2:  [91 %-100 %] 94 %  (08/24 0645)   General:   Alert,  Well-developed, well-nourished, older African-American female pleasant and cooperative in NAD Head:  Normocephalic and atraumatic. Eyes:  Sclera clear, no icterus.   Conjunctiva pink. Ears:  Normal auditory acuity. Nose:  No deformity, discharge,  or lesions. Mouth:  No deformity or lesions.   Neck:  Supple; no masses or thyromegaly. Lungs:  Clear throughout to auscultation.   No wheezes, crackles, or rhonchi.  Heart:  Regular rate and rhythm; no murmurs, clicks, rubs,  or gallops. Abdomen:  Soft, there is mild tenderness in the left mid quadrant, no guarding or rebound Rectal: Not done Msk:  Symmetrical without gross deformities. . Pulses:  Normal pulses noted. Extremities:  Without clubbing or edema. Neurologic:  Alert and  oriented x4;  grossly normal neurologically. Skin:  Intact without significant lesions or rashes.. Psych:  Alert and cooperative. Normal mood and affect.  Intake/Output from previous day: No intake/output data recorded. Intake/Output this shift: No intake/output data recorded.  Lab Results: Recent Labs    07/08/21 2051 07/09/21 0129 07/09/21 0415  WBC 11.4*  --   --   HGB 12.8 9.1* 10.6*  HCT 41.8 30.1* 34.5*  PLT 387  --   --    BMET Recent Labs    07/08/21 2051  NA 140  K 4.5  CL 105  CO2 27  GLUCOSE 171*  BUN 22  CREATININE 1.08*  CALCIUM 10.1   LFT Recent Labs    07/08/21 2051  PROT 8.7*  ALBUMIN 4.5  AST 16  ALT 14  ALKPHOS 50  BILITOT 0.4   PT/INR No results for input(s): LABPROT, INR in the last 72 hours. Hepatitis Panel No results for input(s): HEPBSAG, HCVAB, HEPAIGM, HEPBIGM in the last 72 hours.   IMPRESSION:   #21 60 year old female with history of recurrent diverticular bleeding, admitted after acute onset of dark red and maroon stools last evening.  Patient has previously documented pandiverticulosis and has had several episodes of bleeding over the past 10 years presumed to be  diverticular. Patient had about 5 episodes last evening and has not had any further bleeding since about 9 PM  CTA is negative for any active GI bleeding  #2 anemia secondary to acute blood loss #3 iron deficiency #4 adult onset diabetes mellitus 5.  PCOS 6.  Hyperlipidemia 7.  Status post left adrenalectomy  PLAN: Clear liquid diet today Continue serial hemoglobins and transfuse as indicated for hemoglobin 7 or less Continue avoidance of all aspirin and NSAIDs Do not plan endoscopic evaluation this admission as she just had colonoscopy in January 2022 with admission for a similar bleed  Will observe for at least 24 hours, and then if no further active bleeding and hemoglobin stable she may be able to be discharged  Start oral iron supplementation, then will need repeat iron studies in about 3 months  GI will follow with you   Avrianna Smart PA-C 07/09/2021, 9:50 AM

## 2021-07-09 NOTE — Progress Notes (Signed)
PROGRESS NOTE    Carol Mueller  [id number] DOB: [birth year] DOA: 07/08/2021 PCP: [name], DO    Brief Narrative:  60 y.o. 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    Assessment & Plan:   Principal Problem:   GI bleed Active Problems:   Essential hypertension   Hyperlipidemia with target LDL less than 100   Insulin-requiring or dependent type II diabetes mellitus ([place])   Diverticular hemorrhage   Recurrent lower GI bleed/hematochezia -Likely repeat diverticular hemorrhages. -During prior hospital admission in January 2022, she underwent colonoscopy and 2 polyps were removed, pandiverticulosis noted.  Also underwent EGD with capsule study which revealed polyps in the stomach and no clear source of bleeding. -Hemodynamically stable -Hemoglobin stable -Not on any anticoagulant or antiplatelet agent -Stat CT angiogram abdomen/pelvis personally reviewed with patient, negative for active bleed -Appreciate input by GI, plan to monitor overnight repeat CBC in the morning   Insulin-dependent type 2 diabetes -Check A1c.   -Continue sliding scale insulin as needed -Have resumed patient's home long-acting insulin however when glucose of just over 100, would continue half the dose of her typical dosage   Hypertension -Have continued antihypertensives at this time.   Hyperlipidemia -We will continue home regimen as tolerated   DVT prophylaxis: SCD's Code Status: Full Family Communication: Pt in room, family not at bedside  Status is: Inpatient  Remains inpatient appropriate because:Inpatient level of care appropriate due to severity of illness  Dispo: The patient is from: Home              Anticipated d/c is to: Home              Patient  currently is not medically stable to d/c.   Difficult to place patient No       Consultants:  GI  Procedures:    Antimicrobials: Anti-infectives (From admission, onward)    None       Subjective: Reports no further bleeding this morning.  Eager to be going home soon  Objective: Vitals:   07/09/21 0630 07/09/21 0645 07/09/21 1416 07/09/21 1754  BP: (!) 105/59 (!) 111/56 (!) 149/85 (!) 152/87  Pulse: 69 69 77 72  Resp: (!) '21 20 16 18  '$ Temp:    98.3 F (36.8 C)  TempSrc:      SpO2: 100% 94% 97% 96%  Weight:   115.7 kg   Height:   '5\' 3"'$  (1.6 m)     Intake/Output Summary (Last 24 hours) at 07/09/2021 1828 Last data filed at 07/09/2021 1800 Gross per 24 hour  Intake 640 ml  Output --  Net 640 ml   Filed Weights   07/09/21 1416  Weight: 115.7 kg    Examination: General exam: Awake, laying in bed, in nad Respiratory system: Normal respiratory effort, no wheezing Cardiovascular system: regular rate, s1, s2 Gastrointestinal system: Soft, nondistended, positive BS Central nervous system: CN2-12 grossly intact, strength intact Extremities: Perfused, no clubbing Skin: Normal skin turgor, no notable skin lesions seen Psychiatry: Mood normal // no visual hallucinations   Data Reviewed: I have personally reviewed following labs and imaging studies  CBC: Recent Labs  Lab 07/08/21 2051 07/09/21 0129 07/09/21 0415 07/09/21 1129  WBC 11.4*  --   --   --   HGB 12.8 9.1* 10.6* 11.4*  HCT 41.8 30.1* 34.5* 37.5  MCV 72.1*  --   --   --   PLT 387  --   --   --    Basic Metabolic Panel: Recent Labs  Lab 07/08/21 2051  NA 140  K 4.5  CL 105  CO2 27  GLUCOSE 171*  BUN 22  CREATININE 1.08*  CALCIUM 10.1   GFR: Estimated Creatinine Clearance: 67.1 mL/min (A) (by C-G formula based on SCr of 1.08 mg/dL (H)). Liver Function Tests: Recent Labs  Lab 07/08/21 2051  AST 16  ALT 14  ALKPHOS 50  BILITOT 0.4  PROT 8.7*  ALBUMIN 4.5   No results for  input(s): LIPASE, AMYLASE in the last 168 hours. No results for input(s): AMMONIA in the last 168 hours. Coagulation Profile: No results for input(s): INR, PROTIME in the last 168 hours. Cardiac Enzymes: No results for input(s): CKTOTAL, CKMB, CKMBINDEX, TROPONINI in the last 168 hours. BNP (last 3 results) No results for input(s): PROBNP in the last 8760 hours. HbA1C: Recent Labs    07/09/21 0129  HGBA1C 8.1*   CBG: Recent Labs  Lab 07/09/21 0017 07/09/21 0416 07/09/21 0758 07/09/21 1140 07/09/21 1533  GLUCAP 159* 102* 101* 82 103*   Lipid Profile: No results for input(s): CHOL, HDL, LDLCALC, TRIG, CHOLHDL, LDLDIRECT in the last 72 hours. Thyroid Function Tests: No results for input(s): TSH, T4TOTAL, FREET4, T3FREE, THYROIDAB in the last 72 hours. Anemia Panel: No results for input(s): VITAMINB12, FOLATE, FERRITIN, TIBC, IRON, RETICCTPCT in the last 72 hours. Sepsis Labs: No results for input(s): PROCALCITON, LATICACIDVEN in the last 168 hours.  Recent Results (from the past 240 hour(s))  SARS CORONAVIRUS 2 (TAT 6-24 HRS) Nasopharyngeal Nasopharyngeal Swab     Status: None   Collection Time: 07/08/21 10:27 PM   Specimen: Nasopharyngeal Swab  Result Value Ref Range Status   SARS Coronavirus 2 NEGATIVE NEGATIVE Final    Comment: (NOTE) SARS-CoV-2 target nucleic acids are NOT DETECTED.  The SARS-CoV-2 RNA is generally detectable in upper and lower respiratory specimens during the acute phase of infection. Negative results do not preclude SARS-CoV-2 infection, do not rule out co-infections with other pathogens, and should not be used as the sole basis for treatment or other patient management decisions. Negative results must be combined with clinical observations, patient history, and epidemiological information. The expected result is Negative.  Fact Sheet for Patients: SugarRoll.be  Fact Sheet for Healthcare  Providers: https://www.woods-mathews.com/  This test is not yet approved or cleared by the [place] FDA and  has been authorized for detection and/or diagnosis of SARS-CoV-2 by FDA under an Emergency Use Authorization (EUA). This EUA will remain  in effect (meaning this test can be used) for the duration of the COVID-19 declaration under Se ction 564(b)(1) of the Act, 21 U.S.C. section 360bbb-3(b)(1), unless the authorization is terminated or revoked sooner.  Performed at [facility], [place] [address] [place], [place] [address]      Radiology Studies: CT Angio Abd/Pel w/ and/or w/o  Result Date: 07/08/2021 CLINICAL DATA:  GI bleed since 1630. Previous history of GI bleeding. Dark blood per stool. EXAM: CTA ABDOMEN AND PELVIS WITHOUT AND WITH CONTRAST TECHNIQUE: Multidetector CT imaging of the abdomen and pelvis was performed using the standard protocol during bolus administration of intravenous contrast. Multiplanar reconstructed images and MIPs were obtained and reviewed to evaluate the vascular anatomy. CONTRAST:  157m OMNIPAQUE IOHEXOL 350 MG/ML SOLN COMPARISON:  Radionuclide bleeding scan 11/26/2020.  CT 11/23/2020 FINDINGS: VASCULAR Aorta: Unenhanced images  are obtained followed by arterial phase and delayed phase imaging. The unenhanced images demonstrate surgical clips in the right upper quadrant consistent with adrenalectomy. Multiple calcified uterine fibroids. No significant vascular calcifications. Normal caliber aorta. No aneurysm or dissection. No significant stenosis. Celiac: Patent without evidence of aneurysm, dissection, vasculitis or significant stenosis. SMA: Patent without evidence of aneurysm, dissection, vasculitis or significant stenosis. Renals: Both renal arteries are patent without evidence of aneurysm, dissection, vasculitis, fibromuscular dysplasia or significant stenosis. Small accessory inferior right renal artery. IMA: Patent without evidence  of aneurysm, dissection, vasculitis or significant stenosis. Inflow: Patent without evidence of aneurysm, dissection, vasculitis or significant stenosis. Proximal Outflow: Bilateral common femoral and visualized portions of the superficial and profunda femoral arteries are patent without evidence of aneurysm, dissection, vasculitis or significant stenosis. Veins: No obvious venous abnormality within the limitations of this arterial phase study. Review of the MIP images confirms the above findings. NON-VASCULAR Lower chest: Lung bases are clear. Hepatobiliary: No focal liver abnormality is seen. Status post cholecystectomy. No biliary dilatation. Pancreas: Unremarkable. No pancreatic ductal dilatation or surrounding inflammatory changes. Spleen: Normal in size without focal abnormality. Adrenals/Urinary Tract: Surgical absence of the right adrenal gland. Left adrenal gland is normal. Cysts in both kidneys. No hydronephrosis or hydroureter. Bladder is unremarkable. Stomach/Bowel: The stomach, small bowel, and colon are mostly decompressed. Scattered stool throughout the colon. Scattered colonic diverticula without inflammatory change. Appendix is normal. No intraluminal contrast extravasation is demonstrated. No focal areas of wall thickening or mass are appreciated. Lymphatic: No significant lymphadenopathy. Reproductive: Prominent nodular enlargement of the uterus with uterine calcifications consistent with multiple uterine fibroids. No abnormal adnexal masses. Other: No free air or free fluid in the abdomen. Minimal left inguinal hernia containing fat. Musculoskeletal: No acute or significant osseous findings. IMPRESSION: VASCULAR No significant aneurysm or vascular occlusion demonstrated. NON-VASCULAR 1. No gastrointestinal bleeding site identified. 2. Multiple calcified uterine fibroids. Electronically Signed   By: Lucienne Capers M.D.   On: 07/08/2021 23:14    Scheduled Meds:  atorvastatin  10 mg Oral Daily    carvedilol  25 mg Oral BID   [START ON 07/10/2021] ferrous sulfate  325 mg Oral BID WC   insulin aspart  0-9 Units Subcutaneous Q4H   irbesartan  37.5 mg Oral Daily   pantoprazole  40 mg Oral Daily   spironolactone  50 mg Oral Daily   Continuous Infusions:   LOS: 1 day   Marylu Lund, MD [facility] Pager On Amion  If 7PM-7AM, please contact night-coverage 07/09/2021, 6:28 PM

## 2021-07-10 DIAGNOSIS — E785 Hyperlipidemia, unspecified: Secondary | ICD-10-CM

## 2021-07-10 DIAGNOSIS — I10 Essential (primary) hypertension: Secondary | ICD-10-CM

## 2021-07-10 LAB — HEMOGLOBIN AND HEMATOCRIT, BLOOD
HCT: 37.7 % (ref 36.0–46.0)
HCT: 36 % (ref 36.0–46.0)
HCT: 36 % (ref 36.0–46.0)
Hemoglobin: 11.3 g/dL — ABNORMAL LOW (ref 12.0–15.0)
Hemoglobin: 10.8 g/dL — ABNORMAL LOW (ref 12.0–15.0)
Hemoglobin: 11.1 g/dL — ABNORMAL LOW (ref 12.0–15.0)

## 2021-07-10 LAB — GLUCOSE, CAPILLARY
Glucose-Capillary: 113 mg/dL — ABNORMAL HIGH (ref 70–99)
Glucose-Capillary: 126 mg/dL — ABNORMAL HIGH (ref 70–99)
Glucose-Capillary: 126 mg/dL — ABNORMAL HIGH (ref 70–99)
Glucose-Capillary: 151 mg/dL — ABNORMAL HIGH (ref 70–99)
Glucose-Capillary: 140 mg/dL — ABNORMAL HIGH (ref 70–99)

## 2021-07-10 MED ORDER — FERROUS SULFATE 325 (65 FE) MG PO TABS: 325.0000 mg | ORAL_TABLET | Freq: Two times a day (BID) | ORAL | 0 refills | Status: DC

## 2021-07-10 NOTE — Plan of Care (Signed)

## 2021-07-10 NOTE — Progress Notes (Signed)
[facility] Progress Note  CC:  GI bleed  Subjective:  Feels good.  Anxious to go home.  Passed what she thinks was old blood on one occasion last evening.  Hgb stable.    Objective:  Vital signs in last 24 hours: Temp:  [97.6 F (36.4 C)-98.5 F (36.9 C)] 97.6 F (36.4 C) (08/25 0457) Pulse Rate:  [64-77] 64 (08/25 0457) Resp:  [16-18] 16 (08/25 0457) BP: (109-152)/(60-87) 138/60 (08/25 0457) SpO2:  [96 %-99 %] 99 % (08/25 0457) Weight:  [115.7 kg] 115.7 kg (08/24 1416) Last BM Date: 07/09/21 General:  Alert, Well-developed, in NAD Heart:  Regular rate and rhythm; no murmurs Pulm:  CTAB.  No W/R/R. Abdomen:  Soft, non-distended.  BS present.  Non-tender. Neurologic:  Alert and oriented x 4;  grossly normal neurologically. Psych:  Alert and cooperative. Normal mood and affect.  Intake/Output from previous day: 08/24 0701 - 08/25 0700 In: 76 [P.O.:880] Out: 0   Lab Results: Recent Labs    07/08/21 2051 07/09/21 0129 07/09/21 1129 07/09/21 1921 07/10/21 0339  WBC 11.4*  --   --   --   --   HGB 12.8   < > 11.4* 11.1* 11.3*  HCT 41.8   < > 37.5 36.8 37.7  PLT 387  --   --   --   --    < > = values in this interval not displayed.   BMET Recent Labs    07/08/21 2051 07/09/21 1921  NA 140 136  K 4.5 4.0  CL 105 102  CO2 27 27  GLUCOSE 171* 131*  BUN 22 20  CREATININE 1.08* 0.88  CALCIUM 10.1 9.5   LFT Recent Labs    07/08/21 2051  PROT 8.7*  ALBUMIN 4.5  AST 16  ALT 14  ALKPHOS 50  BILITOT 0.4   CT Angio Abd/Pel w/ and/or w/o  Result Date: 07/08/2021 CLINICAL DATA:  GI bleed since 1630. Previous history of GI bleeding. Dark blood per stool. EXAM: CTA ABDOMEN AND PELVIS WITHOUT AND WITH CONTRAST TECHNIQUE: Multidetector CT imaging of the abdomen and pelvis was performed using the standard protocol during bolus administration of intravenous contrast. Multiplanar reconstructed images and MIPs were obtained and reviewed to evaluate the  vascular anatomy. CONTRAST:  125m OMNIPAQUE IOHEXOL 350 MG/ML SOLN COMPARISON:  Radionuclide bleeding scan 11/26/2020.  CT 11/23/2020 FINDINGS: VASCULAR Aorta: Unenhanced images are obtained followed by arterial phase and delayed phase imaging. The unenhanced images demonstrate surgical clips in the right upper quadrant consistent with adrenalectomy. Multiple calcified uterine fibroids. No significant vascular calcifications. Normal caliber aorta. No aneurysm or dissection. No significant stenosis. Celiac: Patent without evidence of aneurysm, dissection, vasculitis or significant stenosis. SMA: Patent without evidence of aneurysm, dissection, vasculitis or significant stenosis. Renals: Both renal arteries are patent without evidence of aneurysm, dissection, vasculitis, fibromuscular dysplasia or significant stenosis. Small accessory inferior right renal artery. IMA: Patent without evidence of aneurysm, dissection, vasculitis or significant stenosis. Inflow: Patent without evidence of aneurysm, dissection, vasculitis or significant stenosis. Proximal Outflow: Bilateral common femoral and visualized portions of the superficial and profunda femoral arteries are patent without evidence of aneurysm, dissection, vasculitis or significant stenosis. Veins: No obvious venous abnormality within the limitations of this arterial phase study. Review of the MIP images confirms the above findings. NON-VASCULAR Lower chest: Lung bases are clear. Hepatobiliary: No focal liver abnormality is seen. Status post cholecystectomy. No biliary dilatation. Pancreas: Unremarkable. No pancreatic ductal dilatation or surrounding inflammatory  changes. Spleen: Normal in size without focal abnormality. Adrenals/Urinary Tract: Surgical absence of the right adrenal gland. Left adrenal gland is normal. Cysts in both kidneys. No hydronephrosis or hydroureter. Bladder is unremarkable. Stomach/Bowel: The stomach, small bowel, and colon are mostly  decompressed. Scattered stool throughout the colon. Scattered colonic diverticula without inflammatory change. Appendix is normal. No intraluminal contrast extravasation is demonstrated. No focal areas of wall thickening or mass are appreciated. Lymphatic: No significant lymphadenopathy. Reproductive: Prominent nodular enlargement of the uterus with uterine calcifications consistent with multiple uterine fibroids. No abnormal adnexal masses. Other: No free air or free fluid in the abdomen. Minimal left inguinal hernia containing fat. Musculoskeletal: No acute or significant osseous findings. IMPRESSION: VASCULAR No significant aneurysm or vascular occlusion demonstrated. NON-VASCULAR 1. No gastrointestinal bleeding site identified. 2. Multiple calcified uterine fibroids. Electronically Signed   By: Lucienne Capers M.D.   On: 07/08/2021 23:14    Assessment / Plan: #1 60 year old female with history of recurrent diverticular bleeding, admitted after acute onset of dark red and maroon stools.  Patient has previously documented pandiverticulosis and has had several episodes of bleeding over the past 10 years presumed to be diverticular. Patient had about 5 episodes the evening prior to admission.  Had an episode of dark blood last evening, 8/24, but she says she thinks it was old blood.   CTA is negative for any active GI bleeding.  Hgb stable.   #2 anemia secondary to acute blood loss:  Hgb 11.3 grams today as opposed to 11.1 grams yesterday. #3 iron deficiency #4 adult onset diabetes mellitus  *Will advance to soft/low residue diet. *If she tolerates that well and no further sign of bleeding then possibly home later this afternoon/evening. *Continue ferrous sulfate 325 mg BID and repeat iron studies in 3 months.    LOS: 2 days   Laban Emperor. Rainbow Salman  07/10/2021, 9:01 AM

## 2021-07-10 NOTE — Progress Notes (Signed)
Assessment unchanged. Pt verbalized understanding of dc instructions through teach back. Discharged via wc to ED entrance to meet awaiting vehicle.

## 2021-07-10 NOTE — Discharge Summary (Signed)
Physician Discharge Summary  Carol Mueller [id number] DOB: September 17, 1961 DOA: 07/08/2021  PCP: [name], DO  Admit date: 07/08/2021 Discharge date: 07/10/2021  Admitted From: Home Disposition:  Home  Recommendations for Outpatient Follow-up:  Follow up with PCP in 1-2 weeks  Discharge Condition:Stable CODE STATUS:Full Diet recommendation: Diabetic   Brief/Interim Summary: 60 y.o. 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    Discharge Diagnoses:  Principal Problem:   GI bleed Active Problems:   Essential hypertension   Hyperlipidemia with target LDL less than 100   Insulin-requiring or dependent type II diabetes mellitus ([place])   Diverticular hemorrhage  Recurrent lower GI bleed/hematochezia -Likely repeat diverticular hemorrhages. -During prior hospital admission in January 2022, she underwent colonoscopy and 2 polyps were removed, pandiverticulosis noted.  Also underwent EGD with capsule study which revealed polyps in the stomach and no clear source of bleeding. -Hemodynamically stable -Hemoglobin stable -Not on any anticoagulant or antiplatelet agent -Stat CT angiogram abdomen/pelvis personally reviewed with patient, negative for active bleed -Discussed with GI. OK to d/c today   Insulin-dependent type 2 diabetes -Glycemic trends remained stable -Would resume home meds on d/c   Hypertension -Have continued antihypertensives at this time.   Hyperlipidemia -We will continue home regimen as tolerated   Discharge Instructions   Allergies as of 07/10/2021       Reactions   Norvasc [amlodipine Besylate] Swelling   ANGIOEDEMA   Nsaids Other (See Comments)   GI upset/ GI bleed   Sulfa Antibiotics Other (See Comments)   childhood         Medication List     TAKE these medications    acetaminophen 325 MG tablet Commonly known as: TYLENOL Take 2 tablets (650 mg total) by mouth every 6 (six) hours as needed for mild pain (or Fever >/= 101). What changed: when to take this   atorvastatin 10 MG tablet Commonly known as: LIPITOR Take 10 mg by mouth daily.   Basaglar KwikPen 100 UNIT/ML Inject 40 Units into the skin at bedtime.   calcium citrate-vitamin D 315-200 MG-UNIT tablet Commonly known as: CITRACAL+D Take 1 tablet by mouth daily.   carvedilol 25 MG tablet Commonly known as: COREG Take 25 mg by mouth 2 (two) times daily.   dicyclomine 20 MG tablet Commonly known as: BENTYL Take 1 tablet (20 mg total) by mouth 2 (two) times daily as needed for spasms.   ferrous sulfate 325 (65 FE) MG tablet Take 1 tablet (325 mg total) by mouth 2 (two) times daily with a meal. Start taking on: July 11, 2021   glimepiride 2 MG tablet Commonly known as: AMARYL Take 2 mg by mouth daily.   Jardiance 10 MG Tabs tablet Generic drug: empagliflozin Take 10 mg by mouth daily.   multivitamin with minerals tablet Take 1 tablet by mouth daily.   olmesartan 40 MG tablet Commonly known as: BENICAR Take 40 mg by mouth daily.   pantoprazole 40 MG tablet Commonly known as: Protonix Take 1 tablet (40 mg total) by mouth daily.   Potassium Chloride ER 20 MEQ Tbcr Take 20 mEq by mouth daily.   spironolactone 50 MG tablet Commonly known as: ALDACTONE Take 50 mg by mouth daily.        Follow-up Information     [name], DO Follow up.   Specialty: Family Medicine  Why: Hospital follow up Contact information: [address] [place] [address] [phone]                Allergies  Allergen Reactions   Norvasc [Amlodipine Besylate] Swelling    ANGIOEDEMA   Nsaids Other (See Comments)    GI upset/ GI bleed   Sulfa Antibiotics Other (See Comments)    childhood     Consultations: GI  Procedures/Studies: CT Angio Abd/Pel w/ and/or w/o  Result Date: 07/08/2021 CLINICAL DATA:  GI bleed since 1630. Previous history of GI bleeding. Dark blood per stool. EXAM: CTA ABDOMEN AND PELVIS WITHOUT AND WITH CONTRAST TECHNIQUE: Multidetector CT imaging of the abdomen and pelvis was performed using the standard protocol during bolus administration of intravenous contrast. Multiplanar reconstructed images and MIPs were obtained and reviewed to evaluate the vascular anatomy. CONTRAST:  144m OMNIPAQUE IOHEXOL 350 MG/ML SOLN COMPARISON:  Radionuclide bleeding scan 11/26/2020.  CT 11/23/2020 FINDINGS: VASCULAR Aorta: Unenhanced images are obtained followed by arterial phase and delayed phase imaging. The unenhanced images demonstrate surgical clips in the right upper quadrant consistent with adrenalectomy. Multiple calcified uterine fibroids. No significant vascular calcifications. Normal caliber aorta. No aneurysm or dissection. No significant stenosis. Celiac: Patent without evidence of aneurysm, dissection, vasculitis or significant stenosis. SMA: Patent without evidence of aneurysm, dissection, vasculitis or significant stenosis. Renals: Both renal arteries are patent without evidence of aneurysm, dissection, vasculitis, fibromuscular dysplasia or significant stenosis. Small accessory inferior right renal artery. IMA: Patent without evidence of aneurysm, dissection, vasculitis or significant stenosis. Inflow: Patent without evidence of aneurysm, dissection, vasculitis or significant stenosis. Proximal Outflow: Bilateral common femoral and visualized portions of the superficial and profunda femoral arteries are patent without evidence of aneurysm, dissection, vasculitis or significant stenosis. Veins: No obvious venous abnormality within the limitations of this arterial phase study. Review of the MIP images confirms the above findings. NON-VASCULAR Lower chest: Lung bases are  clear. Hepatobiliary: No focal liver abnormality is seen. Status post cholecystectomy. No biliary dilatation. Pancreas: Unremarkable. No pancreatic ductal dilatation or surrounding inflammatory changes. Spleen: Normal in size without focal abnormality. Adrenals/Urinary Tract: Surgical absence of the right adrenal gland. Left adrenal gland is normal. Cysts in both kidneys. No hydronephrosis or hydroureter. Bladder is unremarkable. Stomach/Bowel: The stomach, small bowel, and colon are mostly decompressed. Scattered stool throughout the colon. Scattered colonic diverticula without inflammatory change. Appendix is normal. No intraluminal contrast extravasation is demonstrated. No focal areas of wall thickening or mass are appreciated. Lymphatic: No significant lymphadenopathy. Reproductive: Prominent nodular enlargement of the uterus with uterine calcifications consistent with multiple uterine fibroids. No abnormal adnexal masses. Other: No free air or free fluid in the abdomen. Minimal left inguinal hernia containing fat. Musculoskeletal: No acute or significant osseous findings. IMPRESSION: VASCULAR No significant aneurysm or vascular occlusion demonstrated. NON-VASCULAR 1. No gastrointestinal bleeding site identified. 2. Multiple calcified uterine fibroids. Electronically Signed   By: WLucienne CapersM.D.   On: 07/08/2021 23:14    Subjective: Eager to go home  Discharge Exam: Vitals:   07/10/21 0457 07/10/21 1331  BP: 138/60 (!) 95/59  Pulse: 64 72  Resp: 16 18  Temp: 97.6 F (36.4 C) 98.8 F (37.1 C)  SpO2: 99% 95%   Vitals:   07/09/21 1754 07/09/21 2116 07/10/21 0457 07/10/21 1331  BP: (!) 152/87 109/66 138/60 (!) 95/59  Pulse: 72 69 64 72  Resp: '18 16 16 18  '$ Temp: 98.3 F (36.8 C) 98.5 F (36.9 C) 97.6 F (36.4  C) 98.8 F (37.1 C)  TempSrc:  Oral Oral Oral  SpO2: 96% 97% 99% 95%  Weight:      Height:        General: Pt is alert, awake, not in acute distress Cardiovascular: RRR,  S1/S2 + Respiratory: CTA bilaterally, no wheezing, no rhonchi Abdominal: Soft, NT, ND, bowel sounds + Extremities: no edema, no cyanosis   The results of significant diagnostics from this hospitalization (including imaging, microbiology, ancillary and laboratory) are listed below for reference.     Microbiology: Recent Results (from the past 240 hour(s))  SARS CORONAVIRUS 2 (TAT 6-24 HRS) Nasopharyngeal Nasopharyngeal Swab     Status: None   Collection Time: 07/08/21 10:27 PM   Specimen: Nasopharyngeal Swab  Result Value Ref Range Status   SARS Coronavirus 2 NEGATIVE NEGATIVE Final    Comment: (NOTE) SARS-CoV-2 target nucleic acids are NOT DETECTED.  The SARS-CoV-2 RNA is generally detectable in upper and lower respiratory specimens during the acute phase of infection. Negative results do not preclude SARS-CoV-2 infection, do not rule out co-infections with other pathogens, and should not be used as the sole basis for treatment or other patient management decisions. Negative results must be combined with clinical observations, patient history, and epidemiological information. The expected result is Negative.  Fact Sheet for Patients: SugarRoll.be  Fact Sheet for Healthcare Providers: https://www.woods-mathews.com/  This test is not yet approved or cleared by the [place] FDA and  has been authorized for detection and/or diagnosis of SARS-CoV-2 by FDA under an Emergency Use Authorization (EUA). This EUA will remain  in effect (meaning this test can be used) for the duration of the COVID-19 declaration under Se ction 564(b)(1) of the Act, 21 U.S.C. section 360bbb-3(b)(1), unless the authorization is terminated or revoked sooner.  Performed at [facility], [place] [address], [place], [place] [address]      Labs: BNP (last 3 results) No results for input(s): BNP in the last 8760 hours. Basic Metabolic Panel: Recent Labs   Lab 07/08/21 2051 07/09/21 1921  NA 140 136  K 4.5 4.0  CL 105 102  CO2 27 27  GLUCOSE 171* 131*  BUN 22 20  CREATININE 1.08* 0.88  CALCIUM 10.1 9.5   Liver Function Tests: Recent Labs  Lab 07/08/21 2051  AST 16  ALT 14  ALKPHOS 50  BILITOT 0.4  PROT 8.7*  ALBUMIN 4.5   No results for input(s): LIPASE, AMYLASE in the last 168 hours. No results for input(s): AMMONIA in the last 168 hours. CBC: Recent Labs  Lab 07/08/21 2051 07/09/21 0129 07/09/21 1129 07/09/21 1921 07/10/21 0339 07/10/21 1114 07/10/21 1538  WBC 11.4*  --   --   --   --   --   --   HGB 12.8   < > 11.4* 11.1* 11.3* 10.8* 11.1*  HCT 41.8   < > 37.5 36.8 37.7 36.0 36.0  MCV 72.1*  --   --   --   --   --   --   PLT 387  --   --   --   --   --   --    < > = values in this interval not displayed.   Cardiac Enzymes: No results for input(s): CKTOTAL, CKMB, CKMBINDEX, TROPONINI in the last 168 hours. BNP: Invalid input(s): POCBNP CBG: Recent Labs  Lab 07/09/21 2351 07/10/21 0359 07/10/21 0750 07/10/21 1206 07/10/21 1555  GLUCAP 111* 126* 126* 151* 140*   D-Dimer No results  for input(s): DDIMER in the last 72 hours. Hgb A1c Recent Labs    07/09/21 0129  HGBA1C 8.1*   Lipid Profile No results for input(s): CHOL, HDL, LDLCALC, TRIG, CHOLHDL, LDLDIRECT in the last 72 hours. Thyroid function studies No results for input(s): TSH, T4TOTAL, T3FREE, THYROIDAB in the last 72 hours.  Invalid input(s): FREET3 Anemia work up No results for input(s): VITAMINB12, FOLATE, FERRITIN, TIBC, IRON, RETICCTPCT in the last 72 hours. Urinalysis    Component Value Date/Time   COLORURINE YELLOW 10/25/2020 1109   APPEARANCEUR CLEAR 10/25/2020 1109   LABSPEC 1.018 10/25/2020 1109   PHURINE 5.0 10/25/2020 1109   GLUCOSEU >=500 (A) 10/25/2020 1109   HGBUR NEGATIVE 10/25/2020 1109   [place] 10/25/2020 1109   BILIRUBINUR neg 08/25/2014 1157   KETONESUR 20 (A) 10/25/2020 1109   PROTEINUR  NEGATIVE 10/25/2020 1109   UROBILINOGEN 0.2 08/25/2014 1157   NITRITE NEGATIVE 10/25/2020 1109   LEUKOCYTESUR NEGATIVE 10/25/2020 1109   Sepsis Labs Invalid input(s): PROCALCITONIN,  WBC,  LACTICIDVEN Microbiology Recent Results (from the past 240 hour(s))  SARS CORONAVIRUS 2 (TAT 6-24 HRS) Nasopharyngeal Nasopharyngeal Swab     Status: None   Collection Time: 07/08/21 10:27 PM   Specimen: Nasopharyngeal Swab  Result Value Ref Range Status   SARS Coronavirus 2 NEGATIVE NEGATIVE Final    Comment: (NOTE) SARS-CoV-2 target nucleic acids are NOT DETECTED.  The SARS-CoV-2 RNA is generally detectable in upper and lower respiratory specimens during the acute phase of infection. Negative results do not preclude SARS-CoV-2 infection, do not rule out co-infections with other pathogens, and should not be used as the sole basis for treatment or other patient management decisions. Negative results must be combined with clinical observations, patient history, and epidemiological information. The expected result is Negative.  Fact Sheet for Patients: SugarRoll.be  Fact Sheet for Healthcare Providers: https://www.woods-mathews.com/  This test is not yet approved or cleared by the [place] FDA and  has been authorized for detection and/or diagnosis of SARS-CoV-2 by FDA under an Emergency Use Authorization (EUA). This EUA will remain  in effect (meaning this test can be used) for the duration of the COVID-19 declaration under Se ction 564(b)(1) of the Act, 21 U.S.C. section 360bbb-3(b)(1), unless the authorization is terminated or revoked sooner.  Performed at [facility], [place] [address] [place], [place] [address]    Time spent: 91mn  SIGNED:   SMarylu Lund MD  [facility] 07/10/2021, 5:26 PM  If 7PM-7AM, please contact night-coverage

## 2021-07-12 ENCOUNTER — Other Ambulatory Visit: Payer: No Typology Code available for payment source

## 2021-07-26 ENCOUNTER — Other Ambulatory Visit: Payer: No Typology Code available for payment source

## 2021-11-21 IMAGING — CT CT CTA ABD/PEL W/CM AND/OR W/O CM
4 of 12 series · 14 of 46 positions shown, 18 images · IV contrast (omnipaque)
Comparison: CT dated 10/24/2020

CLINICAL DATA: GI bleed

EXAM:
CTA ABDOMEN AND PELVIS WITHOUT AND WITH CONTRAST
TECHNIQUE: Multidetector CT imaging of the abdomen and pelvis was performed
using the standard protocol during bolus administration of
intravenous contrast. Multiplanar reconstructed images and MIPs were
obtained and reviewed to evaluate the vascular anatomy.
CONTRAST:  100mL OMNIPAQUE IOHEXOL 350 MG/ML SOLN

[Series 5: axial pre · axial · non-contrast · 0.90mm/px · z∈[-272,-122]mm · 2 of 92 slices shown]
[im 31/92  soft-tissue]
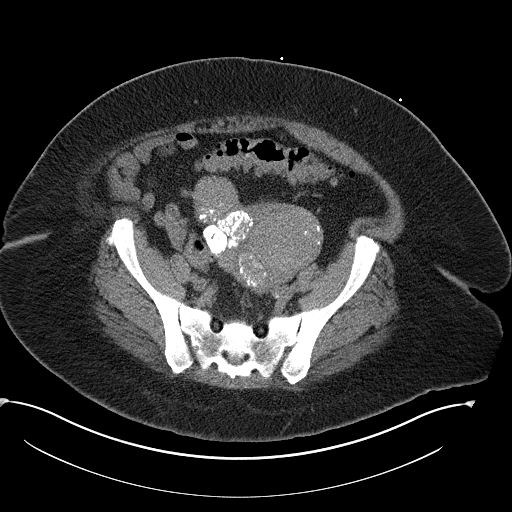
[im 61/92  soft-tissue]
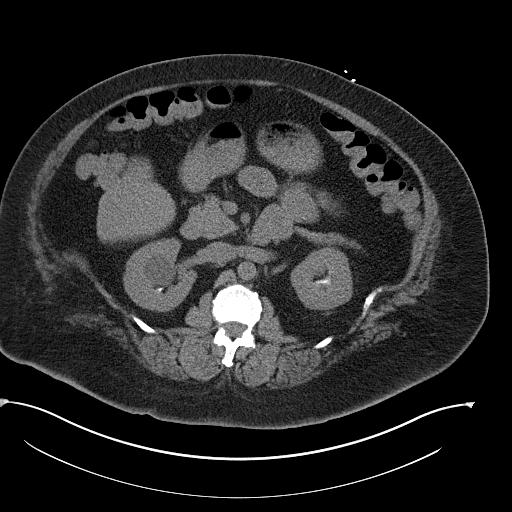

[Series 7: axial arterial · axial · arterial · 0.94mm/px · z∈[-386,-22]mm · 8 of 236 slices shown]
[im 27/236  soft-tissue]
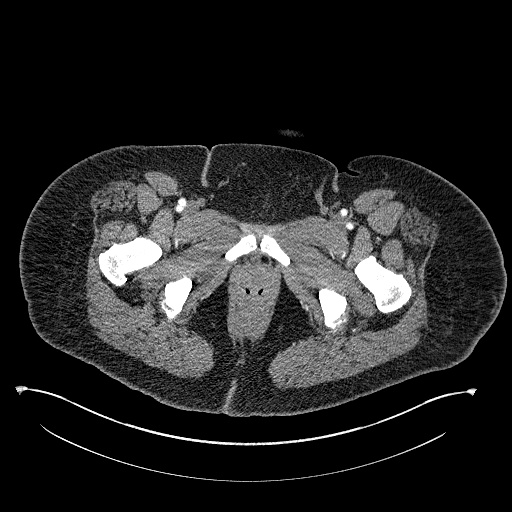
[im 53/236  soft-tissue]
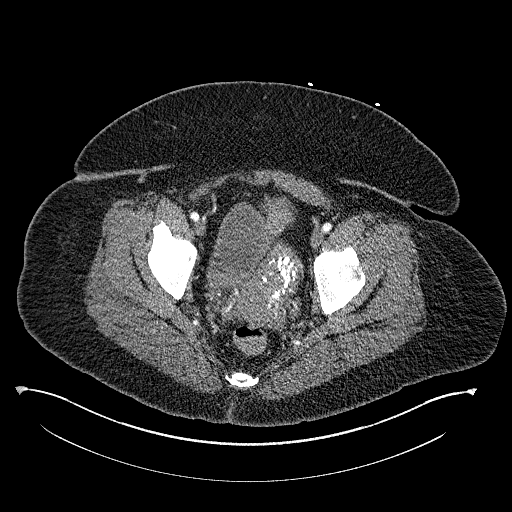
[im 79/236  soft-tissue]
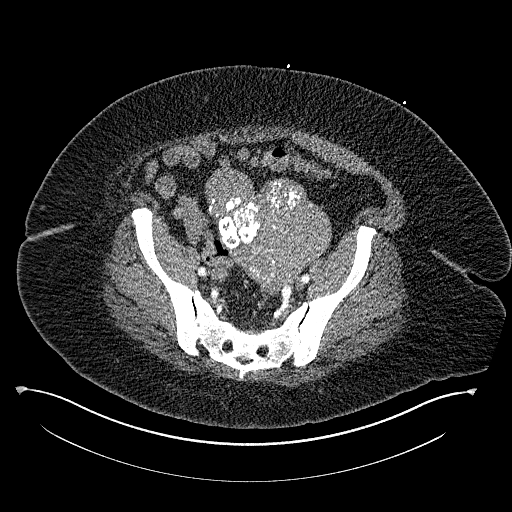
[im 105/236  soft-tissue]
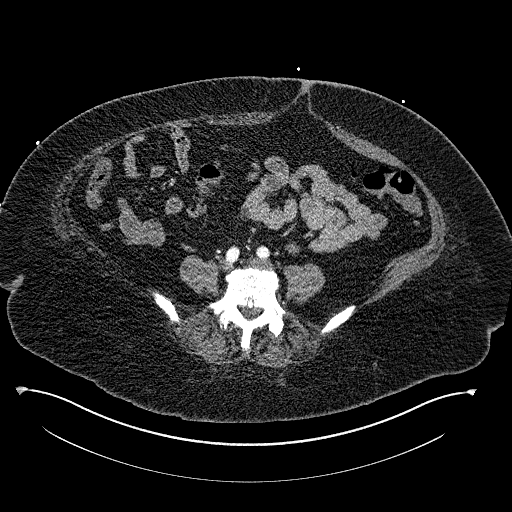
[im 131/236  soft-tissue]
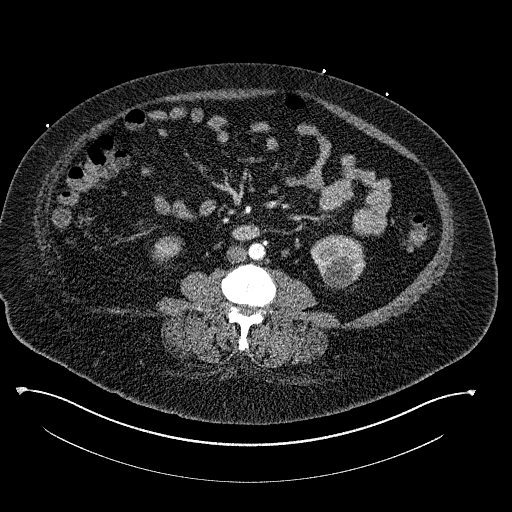
[im 157/236  soft-tissue]
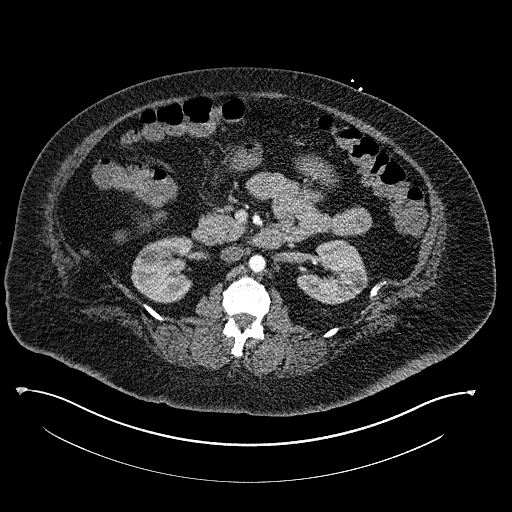
[im 183/236  soft-tissue]
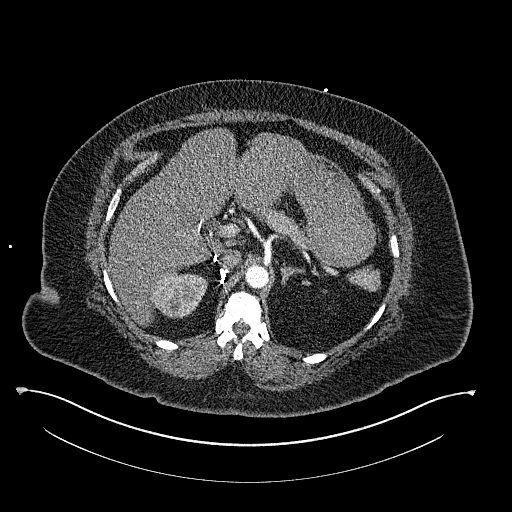
[im 209/236  soft-tissue]
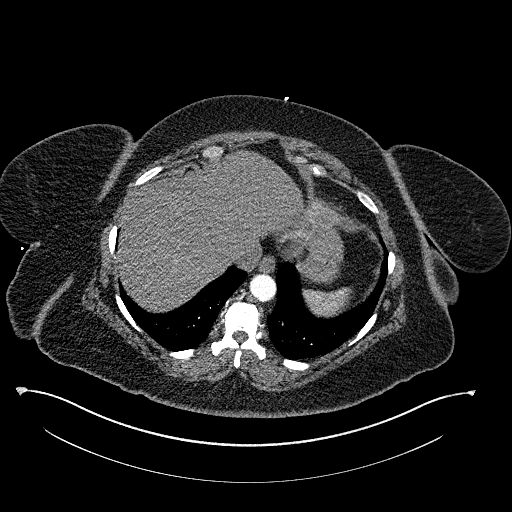

[Series 8: axial portal venous · axial · portal-venous · 0.94mm/px · z∈[-288,-128]mm · 2 of 96 slices shown, 5 images]
[im 32/96  soft-tissue]
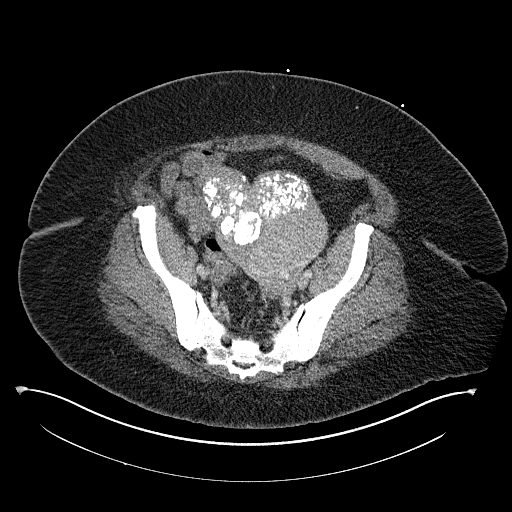
[im 32/96  lung]
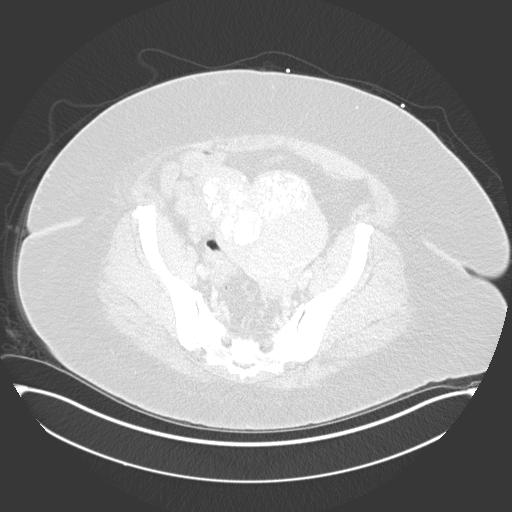
[im 32/96  bone]
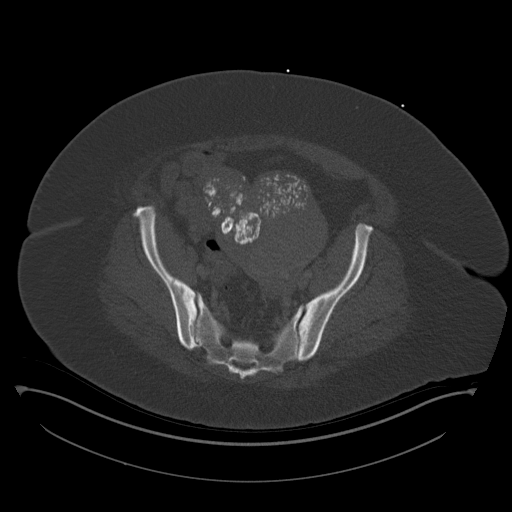
[im 64/96  soft-tissue]
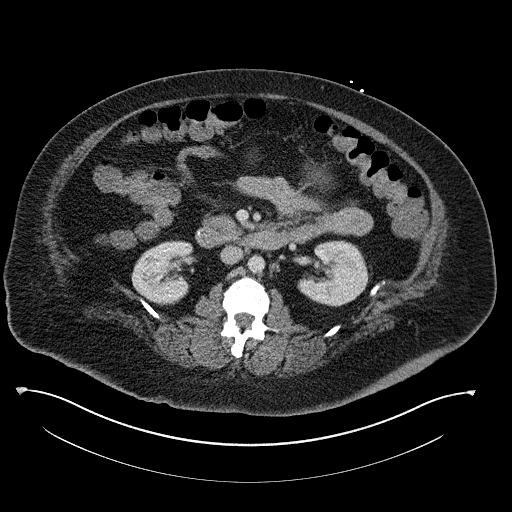
[im 64/96  lung]
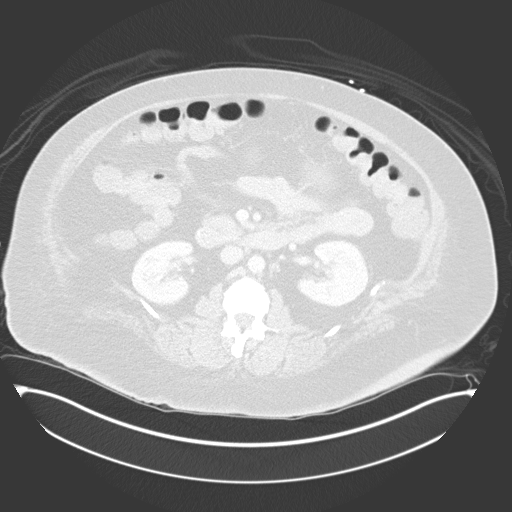

[Series 13: coronal arterial · coronal · arterial · 0.98mm/px · 2 of 218 slices shown, 3 images]
[im 73/218  soft-tissue]
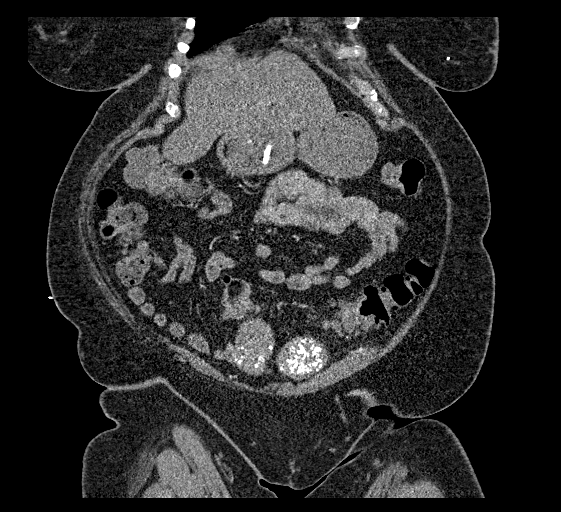
[im 73/218  bone]
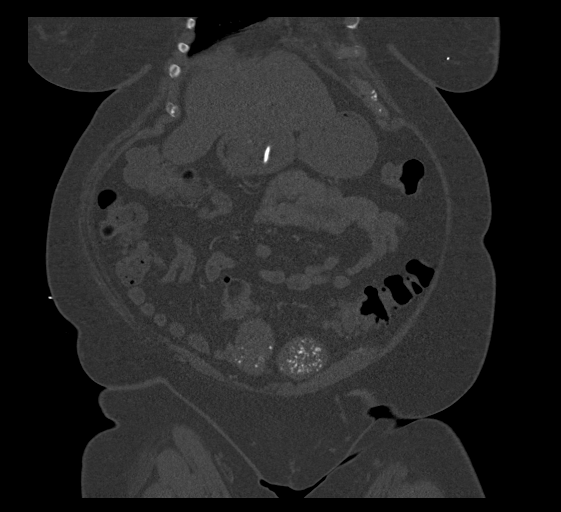
[im 145/218  soft-tissue]
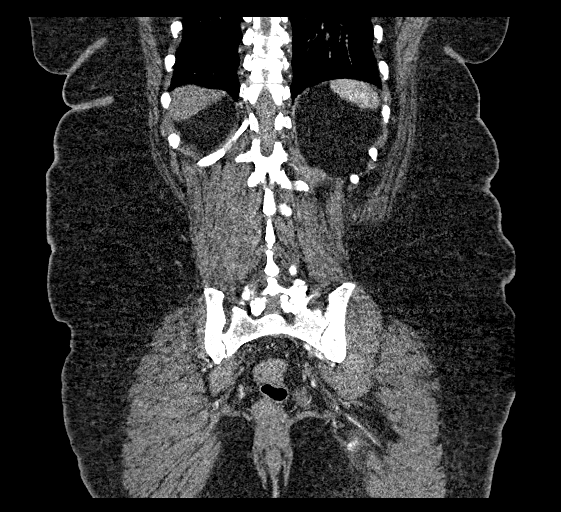

[14 of 46 positions shown; findings below may reference images not displayed]

FINDINGS: VASCULAR

Aorta: Normal caliber aorta without aneurysm, dissection, vasculitis
or significant stenosis.

Celiac: Patent without evidence of aneurysm, dissection, vasculitis
or significant stenosis.

SMA: Patent without evidence of aneurysm, dissection, vasculitis or
significant stenosis.

Renals: Both renal arteries are patent without evidence of aneurysm,
dissection, vasculitis, fibromuscular dysplasia or significant
stenosis.

IMA: Patent without evidence of aneurysm, dissection, vasculitis or
significant stenosis.

Inflow: Patent without evidence of aneurysm, dissection, vasculitis
or significant stenosis.

Proximal Outflow: Bilateral common femoral and visualized portions
of the superficial and profunda femoral arteries are patent without
evidence of aneurysm, dissection, vasculitis or significant
stenosis.

Veins: No obvious venous abnormality within the limitations of this
arterial phase study.

Review of the MIP images confirms the above findings.

NON-VASCULAR

Lower chest: The lung bases are clear. The heart size is normal.

Hepatobiliary: The liver is normal. Status post
cholecystectomy.There is no biliary ductal dilation.

Pancreas: Normal contours without ductal dilatation. No
peripancreatic fluid collection.

Spleen: Unremarkable.

Adrenals/Urinary Tract:

--Adrenal glands: Unremarkable.

--Right kidney/ureter: Multiple right renal cysts are noted.

--Left kidney/ureter: Multiple left renal cysts are noted.

--Urinary bladder: Unremarkable.

Stomach/Bowel:

--Stomach/Duodenum: There is a linear hyperdense focus within the
gastric lumen which may represent an ingested hyperdense pill.

--Small bowel: Unremarkable.

--Colon: There is scattered colonic diverticula without CT evidence
for diverticulitis.

--Appendix: Normal.

Lymphatic:

--No retroperitoneal lymphadenopathy.

--No mesenteric lymphadenopathy.

--No pelvic or inguinal lymphadenopathy.

Reproductive: Multiple calcified fibroids involving the patient's
uterus.

Other: No ascites or free air. The abdominal wall is normal.

Musculoskeletal. No acute displaced fractures.
IMPRESSION: 1. No acute findings.
2. Colonic diverticulosis without CT evidence for diverticulitis.
3. Fibroid uterus.

## 2022-02-23 ENCOUNTER — Other Ambulatory Visit: Payer: Self-pay | Admitting: Family Medicine

## 2022-02-23 DIAGNOSIS — Z1231 Encounter for screening mammogram for malignant neoplasm of breast: Secondary | ICD-10-CM

## 2022-04-06 ENCOUNTER — Ambulatory Visit
Admission: RE | Admit: 2022-04-06 | Discharge: 2022-04-06 | Disposition: A | Discharge status: Home / Self Care | Payer: No Typology Code available for payment source | Source: Ambulatory Visit | Attending: Family Medicine | Admitting: Family Medicine

## 2022-04-06 DIAGNOSIS — Z1231 Encounter for screening mammogram for malignant neoplasm of breast: Secondary | ICD-10-CM

## 2022-09-11 ENCOUNTER — Ambulatory Visit: Payer: No Typology Code available for payment source | Admitting: Nurse Practitioner

## 2022-10-16 ENCOUNTER — Encounter: Payer: Self-pay | Admitting: Nurse Practitioner

## 2022-10-16 ENCOUNTER — Ambulatory Visit (INDEPENDENT_AMBULATORY_CARE_PROVIDER_SITE_OTHER): Payer: No Typology Code available for payment source | Admitting: Nurse Practitioner

## 2022-10-16 VITALS — BP 132/74 | HR 73 | Ht 63.0 in | Wt 248.0 lb

## 2022-10-16 DIAGNOSIS — R1084 Generalized abdominal pain: Secondary | ICD-10-CM | POA: Diagnosis not present

## 2022-10-16 NOTE — Progress Notes (Signed)
Assessment    Patient profile:  Carol Mueller is a 61 y.o. female known to Dr. Ardis Mueller with a past medical history of diverticulosis, adenomatous colon polyps hyperlipidemia, DM history of Cushings s/p adrenalectomy.  See PMH /PSH for additional history  # Generalized, non-radiating upper abdominal pain July-August. Resolved.  She was able to correlated onset of pain with initiation of Ozempic.    Plan  She has been tolerating Ozempic for the last couple of months.  Advised to call us if pain recurs   HPI    Chief complaint:  abdominal pain   Carol Mueller has previously been followed here for a history of diverticulosis, recurrent episodes of GI bleeding,  presumably diverticular in origin.  Carol Mueller was last seen here by Dr. Ardis Mueller 03/12/2021, please refer to that office note for details.  In summary she was following up on another episode of what was presumed to be a diverticular hemorrhage.  At the time of the visit she was doing okay, we had her on oral iron.     Interval History:  In July and August Carol Mueller was having generalized, nonradiating upper abdominal pain.  She felt like she needed to eat to relieve the pain but eating actually made the pain worse.  No associated nausea nor vomiting.  Bowel movements unchanged.  No blood in stool.  No NSAID use.  She was able to correlate the onset of pain with the initiation of Ozempic.  She has continue the Ozempic without any further problems over the last couple of months.  She has been able to lose some weight on Ozempic   Previous GI Evaluation   November 2020 colonoscopy for GI bleed --Two 6 to 8 mm polyps in the descending colon and in the transverse colon were removed - Mild diverticulosis in the right colon. There was no evidence of diverticular bleeding. - Moderate diverticulosis in the left colon. There was no evidence of diverticular bleeding. - Internal hemorrhoids. - The examination was otherwise normal on direct and  retroflexion views. 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 -- A few sessile polyps with no bleeding and no stigmata of recent bleeding were found in the gastric fundus and in the gastric body.  These were not removed so not to complicate the VCE reading with a post polypectomy bleed.  -- Normal duodenal bulb, first portion of the duodenum and second portion of the duodenum.  Colonoscopy January 2022 for evaluation of hematochezia -- One 9 mm polyp at the appendiceal orifice was removed.  One 6 mm polyp in the transverse colon was removed.  Internal hemorrhoids.  Moderate diverticulosis in the entire colon.  Video capsule study January 2022 -Complete study with adequate prep. -Small area of focal erythema versus subtle nonbleeding erosion in the distal small bowel.  Finding appears to be incidental and not likely of clinical consequence.  Area would be out of reach for single balloon enteroscope.  Otherwise completely normal study.   Imaging   No recent labs or imaging  Labs:     Latest Ref Rng & Units 07/10/2021    3:38 PM 07/10/2021   11:14 AM 07/10/2021    3:39 AM  CBC  Hemoglobin 12.0 - 15.0 g/dL 11.1  10.8  11.3   Hematocrit 36.0 - 46.0 % 36.0  36.0  37.7        Latest Ref Rng &  Units 07/08/2021    8:51 PM 12/06/2020   12:34 PM 11/28/2020    3:45 AM  Hepatic Function  Total Protein 6.5 - 8.1 g/dL 8.7  7.5  6.3   Albumin 3.5 - 5.0 g/dL 4.5  4.1  3.3   AST 15 - 41 U/L _0 ALT 0 - 44 U/L _1 Alk Phosphatase 38 - 126 U/L 50  52  42   Total Bilirubin 0.3 - 1.2 mg/dL 0.4  0.3  0.5      Past Medical History:  Diagnosis Date   Anemia    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   Diabetes mellitus without complication ([place]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   Diverticulitis    Diverticulosis    GI bleed    Hyperlipidemia    Hypertension     Polycystic ovary disease    Sepsis ([place]) 05/2019    Past Surgical History:  Procedure Laterality Date   ADRENALECTOMY  1996   BREAST BIOPSY Right 2018   BREAST BIOPSY Left    x2   CHOLECYSTECTOMY     COLONOSCOPY WITH PROPOFOL N/A 10/03/2019   Procedure: COLONOSCOPY WITH PROPOFOL;  Surgeon: Ladene Artist, MD;  Location: [facility];  Service: Endoscopy;  Laterality: N/A;   COLONOSCOPY WITH PROPOFOL N/A 11/22/2020   Procedure: COLONOSCOPY WITH PROPOFOL;  Surgeon: Ladene Artist, MD;  [facility];  Service: Endoscopy;  Laterality: N/A;   ESOPHAGOGASTRODUODENOSCOPY (EGD) WITH PROPOFOL N/A 07/16/2015   Procedure: ESOPHAGOGASTRODUODENOSCOPY (EGD) WITH PROPOFOL;  Surgeon: [name], MD;  [facility];  Service: Endoscopy;  Laterality: N/A;   ESOPHAGOGASTRODUODENOSCOPY (EGD) WITH PROPOFOL N/A 11/28/2020   Procedure: ESOPHAGOGASTRODUODENOSCOPY (EGD) WITH PROPOFOL;  Surgeon: Lavena Bullion, DO;  [facility];  Service: Gastroenterology;  Laterality: N/A;   GIVENS CAPSULE STUDY N/A 11/28/2020   Procedure: GIVENS CAPSULE STUDY;  Surgeon: Lavena Bullion, DO;  [facility];  Service: Gastroenterology;  Laterality: N/A;   POLYPECTOMY  10/03/2019   Procedure: POLYPECTOMY;  Surgeon: Ladene Artist, MD;  [facility];  Service: Endoscopy;;   POLYPECTOMY  11/22/2020   Procedure: POLYPECTOMY;  Surgeon: Ladene Artist, MD;  [facility];  Service: Endoscopy;;    Current Medications, Allergies, Family History and Social History were reviewed in [place] record.     Current Outpatient Medications  Medication Sig Dispense Refill   acetaminophen (TYLENOL) 325 MG tablet Take 2 tablets (650 mg total) by mouth every 6 (six) hours as needed for mild pain (or Fever >/= 101). (Patient taking differently: Take 650 mg by mouth as needed for mild pain (or Fever >/= 101).) 40 tablet 0   atorvastatin (LIPITOR) 10 MG  tablet Take 10 mg by mouth daily.     calcium citrate-vitamin D (CITRACAL+D) 315-200 MG-UNIT tablet Take 1 tablet by mouth daily.     carvedilol (COREG) 25 MG tablet Take 25 mg by mouth 2 (two) times daily.     dicyclomine (BENTYL) 20 MG tablet Take 1 tablet (20 mg total) by mouth 2 (two) times daily as needed for spasms. 60 tablet 0   empagliflozin (JARDIANCE) 10 MG TABS tablet Take 10 mg by mouth daily.     ferrous sulfate 325 (65 FE) MG tablet Take 1 tablet (325 mg total) by mouth 2 (two) times daily with a meal. 60 tablet 0   glimepiride (AMARYL) 2 MG tablet  Take 2 mg by mouth daily.     Insulin Glargine (BASAGLAR KWIKPEN) 100 UNIT/ML Inject 40 Units into the skin at bedtime.     Multiple Vitamins-Minerals (MULTIVITAMIN WITH MINERALS) tablet Take 1 tablet by mouth daily.     olmesartan (BENICAR) 40 MG tablet Take 40 mg by mouth daily.     pantoprazole (PROTONIX) 40 MG tablet Take 1 tablet (40 mg total) by mouth daily. 30 tablet 3   Potassium Chloride ER 20 MEQ TBCR Take 20 mEq by mouth daily.     spironolactone (ALDACTONE) 50 MG tablet Take 50 mg by mouth daily.     No current facility-administered medications for this visit.    Review of Systems: No chest pain. No shortness of breath. No urinary complaints.    Physical Exam  Wt Readings from Last 3 Encounters:  07/09/21 255 lb (115.7 kg)  03/12/21 251 lb (113.9 kg)  12/16/20 246 lb 6 oz (111.8 kg)    BP 132/74   Pulse 73   Ht _0  (1.6 m)   Wt 248 lb (112.5 kg)   BMI 43.93 kg/m  Constitutional:  Generally well appearing female in no acute distress. Psychiatric: Pleasant. Normal mood and affect. Behavior is normal. EENT: Pupils normal.  Conjunctivae are normal. No scleral icterus. Neck supple.  Cardiovascular: Normal rate, regular rhythm. No edema Pulmonary/chest: Effort normal and breath sounds normal. No wheezing, rales or rhonchi. Abdominal: Soft, nondistended, nontender. Bowel sounds active throughout. There are no  masses palpable. No hepatomegaly. Neurological: Alert and oriented to person place and time. Skin: Skin is warm and dry. No rashes noted.  Tye Savoy, NP  10/16/2022, 8:13 AM

## 2022-10-16 NOTE — Patient Instructions (Signed)
Follow up as needed.  It was a pleasure to see you today!  Thank you for trusting me with your gastrointestinal care!     Tye Savoy, NP

## 2022-10-19 NOTE — Progress Notes (Signed)
Agree with the assessment and plan as outlined by Paula Guenther, NP. ° °Sharone Picchi, DO, FACG ° °

## 2022-12-25 ENCOUNTER — Telehealth: Payer: Self-pay | Admitting: Nurse Practitioner

## 2022-12-25 NOTE — Telephone Encounter (Signed)
Pt states she had BRB in her stool this am. She has not been constipated, reports she had loose stools this week. Pt wanted to schedule and appt. Pt scheduled to see Tye Savoy NP 12/28/22 at 9am. Pt aware of appt.

## 2022-12-25 NOTE — Telephone Encounter (Signed)
Inbound call from patient requesting a call back from nurse to discuss bloody stools she has been having. Please advise.

## 2022-12-28 ENCOUNTER — Encounter: Payer: Self-pay | Admitting: Nurse Practitioner

## 2022-12-28 ENCOUNTER — Ambulatory Visit (INDEPENDENT_AMBULATORY_CARE_PROVIDER_SITE_OTHER): Payer: No Typology Code available for payment source | Admitting: Nurse Practitioner

## 2022-12-28 VITALS — BP 91/62 | HR 50 | Ht 63.0 in | Wt 243.0 lb

## 2022-12-28 DIAGNOSIS — R1030 Lower abdominal pain, unspecified: Secondary | ICD-10-CM

## 2022-12-28 DIAGNOSIS — K625 Hemorrhage of anus and rectum: Secondary | ICD-10-CM

## 2022-12-28 MED ORDER — DICYCLOMINE HCL 20 MG PO TABS: 20.0000 mg | ORAL_TABLET | Freq: Every day | ORAL | 3 refills | Status: DC | PRN

## 2022-12-28 MED ORDER — HYDROCORTISONE (PERIANAL) 2.5 % EX CREA: 1.0000 | TOPICAL_CREAM | Freq: Every day | CUTANEOUS | 0 refills | Status: AC

## 2022-12-28 NOTE — Patient Instructions (Signed)
We have sent the following medications to your pharmacy for you to pick up at your convenience: bentyl and anusol cream.  Call Berniece Salines, RN at [phone] in 2 weeks with an update om your symptoms.   _______________________________________________________  If your blood pressure at your visit was 140/90 or greater, please contact your primary care physician to follow up on this.  ____________________________________________________ If you are age 63 or older, your body mass index should be between 23-30. Your Body mass index is 43.05 kg/m. If this is out of the aforementioned range listed, please consider follow up with your Primary Care Provider.  If you are age 33 or younger, your body mass index should be between 19-25. Your Body mass index is 43.05 kg/m. If this is out of the aformentioned range listed, please consider follow up with your Primary Care Provider.   ____________________________________________________  The [facility] providers would like to encourage you to use [facility] to communicate with providers for non-urgent requests or questions.  Due to long hold times on the telephone, sending your provider a message by [facility] may be a faster and more efficient way to get a response.  Please allow 48 business hours for a response.  Please remember that this is for non-urgent requests.  ____________________________________________________

## 2022-12-28 NOTE — Progress Notes (Signed)
Assessment    # 62 yo female with one episode of painless rectal bleeding three days ago. Normal BMs since.  She does have a history of recurrent diverticular bleeding but the isolated nature of this episode makes diverticular hemorrhage unlikely. Suspect hemorrhoidal  bleeding. Internal hemorrhoids / no precancerous polyps on colonoscopy in Jan 2022.   # Occasional generalized abdominal discomfort often relieved with defecation. Bentyl prescription is outdated.    Plan   Anusol cream PR x 7-10 nights Call my nurse Mickel Baas in 2 weeks with a condition update ( or sooner if has recurrent bleeding)  Refill Bentyl 20 mg to use 1-2 times daily as needed.    HPI    Chief complaint: one episode of rectal bleeding   Carol Mueller is a 62 y.o. female known to Dr. [name] / Dr. Bryan Lemma with a past medical history of pandiverticulosis /recurrent diverticular bleed, adenomatous colon polyps,  hyperlipidemia, DM, history of Cushings s/p adrenalectomy.  See PMH / [place] for additional details.   Interval History:  Carol Mueller called the office to 12/25/22 with complaints of blood in her stool and was given this appointment today.   Carol Mueller generally has at least two solid BMs a day but last Wed she had a couple of loose, non-bloody stools. On Thursday BMs were back to normal. Then on Friday she had a BM with blood x 1. Since then bowel movements have been normal.  The bleeding was not associated with any abdominal pain or rectal pain. Unrelated, she does have occasional intermittent generalized lower abdominal pain. The pain is unrelated to eating, it often gets better with a bowel movement. Her prescription of bentyl is out of date .  Carol Mueller has a history of Cushing's and is s/p right adrenalectomy. Recently has been undergoing evaluation for elevated testosterone level , hyperandrogenism. Felt to have ovarian source of elevated hormones. She is having an oophorectomy next month.      Previous GI  Evaluation   January 2022 inpatient colonoscopy for hematochezia -Adequate bowel prep.  -At 9 mm polyp (pedunculated) with found at the appendiceal orifice.  It was removed.  A 6 mm sessile polyp in the transverse colon was removed.  Internal hemorrhoids and multiple medium mouth diverticula were found in the entire colon.  There was an impacted diverticulum.  No evidence of diverticular bleeding  FINAL MICROSCOPIC DIAGNOSIS:   A. COLON, CECAL POLYP, POLYPECTOMY:  - Benign colonic mucosa with lymphoid aggregate.  - No dysplasia or malignancy.   B. COLON, TRANSVERSE POLYP, POLYECTOMY:  - Hyperplastic polyp.  - No dysplasia or malignancy.    Imaging   Labs:     Latest Ref Rng & Units 07/10/2021    3:38 PM 07/10/2021   11:14 AM 07/10/2021    3:39 AM  CBC  Hemoglobin 12.0 - 15.0 g/dL 11.1  10.8  11.3   Hematocrit 36.0 - 46.0 % 36.0  36.0  37.7        Latest Ref Rng & Units 07/08/2021    8:51 PM 12/06/2020   12:34 PM 11/28/2020    3:45 AM  Hepatic Function  Total Protein 6.5 - 8.1 g/dL 8.7  7.5  6.3   Albumin 3.5 - 5.0 g/dL 4.5  4.1  3.3   AST 15 - 41 U/L 16  11  14   $ ALT 0 - 44 U/L 14  9  12   $ Alk Phosphatase 38 - 126 U/L 50  52  42  Total Bilirubin 0.3 - 1.2 mg/dL 0.4  0.3  0.5      Past Medical History:  Diagnosis Date   Anemia    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   Diabetes mellitus without complication ([place]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   Diverticulitis    Diverticulosis    GI bleed    Hyperlipidemia    Hypertension    Polycystic ovary disease    Sepsis ([place]) 05/2019    Past Surgical History:  Procedure Laterality Date   ADRENALECTOMY  1996   BREAST BIOPSY Right 2018   BREAST BIOPSY Left    x2   CHOLECYSTECTOMY     COLONOSCOPY WITH PROPOFOL N/A 10/03/2019   Procedure: COLONOSCOPY WITH PROPOFOL;  Surgeon: Ladene Artist, MD;  [facility];  Service: Endoscopy;  Laterality: N/A;   COLONOSCOPY WITH PROPOFOL N/A 11/22/2020   Procedure:  COLONOSCOPY WITH PROPOFOL;  Surgeon: Ladene Artist, MD;  [facility];  Service: Endoscopy;  Laterality: N/A;   ESOPHAGOGASTRODUODENOSCOPY (EGD) WITH PROPOFOL N/A 07/16/2015   Procedure: ESOPHAGOGASTRODUODENOSCOPY (EGD) WITH PROPOFOL;  Surgeon: [name], MD;  [facility];  Service: Endoscopy;  Laterality: N/A;   ESOPHAGOGASTRODUODENOSCOPY (EGD) WITH PROPOFOL N/A 11/28/2020   Procedure: ESOPHAGOGASTRODUODENOSCOPY (EGD) WITH PROPOFOL;  Surgeon: Lavena Bullion, DO;  [facility];  Service: Gastroenterology;  Laterality: N/A;   GIVENS CAPSULE STUDY N/A 11/28/2020   Procedure: GIVENS CAPSULE STUDY;  Surgeon: Lavena Bullion, DO;  [facility];  Service: Gastroenterology;  Laterality: N/A;   POLYPECTOMY  10/03/2019   Procedure: POLYPECTOMY;  Surgeon: Ladene Artist, MD;  [facility];  Service: Endoscopy;;   POLYPECTOMY  11/22/2020   Procedure: POLYPECTOMY;  Surgeon: Ladene Artist, MD;  [facility];  Service: Endoscopy;;    Current Medications, Allergies, Family History and Social History were reviewed in [place] record.     Current Outpatient Medications  Medication Sig Dispense Refill   acetaminophen (TYLENOL) 325 MG tablet Take 2 tablets (650 mg total) by mouth every 6 (six) hours as needed for mild pain (or Fever >/= 101). (Patient taking differently: Take 650 mg by mouth as needed for mild pain (or Fever >/= 101).) 40 tablet 0   atorvastatin (LIPITOR) 10 MG tablet Take 10 mg by mouth daily.     calcium citrate-vitamin D (CITRACAL+D) 315-200 MG-UNIT tablet Take 1 tablet by mouth daily.     carvedilol (COREG) 25 MG tablet Take 25 mg by mouth 2 (two) times daily.     dicyclomine (BENTYL) 20 MG tablet Take 1 tablet (20 mg total) by mouth 2 (two) times daily as needed for spasms. 60 tablet 0   empagliflozin (JARDIANCE) 10 MG TABS tablet Take 10 mg by mouth daily.     insulin aspart protamine -  aspart (NOVOLOG MIX 70/30 FLEXPEN) (70-30) 100 UNIT/ML FlexPen Inject 20 units in the morning, and 30-40 at bedtime     metformin (FORTAMET) 500 MG (OSM) 24 hr tablet Take 500 mg by mouth 2 (two) times daily with a meal.     methocarbamol (ROBAXIN) 500 MG tablet Take 500 mg by mouth as needed for muscle spasms.     Multiple Vitamins-Minerals (MULTIVITAMIN WITH MINERALS) tablet Take 1 tablet by mouth daily.     olmesartan (BENICAR) 40 MG tablet Take 40 mg by mouth daily.     Semaglutide,0.25 or 0.5MG/DOS, (OZEMPIC, 0.25 OR 0.5 MG/DOSE,) 2 MG/1.5ML SOPN Inject into the skin  once a week.     spironolactone (ALDACTONE) 50 MG tablet Take 50 mg by mouth daily.     No current facility-administered medications for this visit.    Review of Systems: No chest pain. No shortness of breath. No urinary complaints.    Physical Exam  Wt Readings from Last 3 Encounters:  10/16/22 248 lb (112.5 kg)  07/09/21 255 lb (115.7 kg)  03/12/21 251 lb (113.9 kg)    BP 91/62   Pulse (!) 50   Ht 5' 3"$  (1.6 m)   Wt 243 lb (110.2 kg)   BMI 43.05 kg/m  Constitutional:  Pleasant, generally well appearing female in no acute distress. Psychiatric: Normal mood and affect. Behavior is normal. EENT: Pupils normal.  Conjunctivae are normal. No scleral icterus. Neck supple.  Cardiovascular: Normal rate, regular rhythm.  Pulmonary/chest: Effort normal and breath sounds normal. No wheezing, rales or rhonchi. Abdominal: Soft, nondistended, nontender. Bowel sounds active throughout. There are no masses palpable. No hepatomegaly. Neurological: Alert and oriented to person place and time.  Skin: Skin is warm and dry. No rashes noted.  Tye Savoy, NP  12/28/2022, 8:08 AM  Cc:  [name], DO

## 2023-01-08 NOTE — Progress Notes (Signed)
Agree with the assessment and plan as outlined by Paula Guenther, NP. ° °Arbie Reisz, DO, FACG ° °

## 2023-01-15 HISTORY — PX: ABDOMINAL HYSTERECTOMY: SHX81

## 2023-03-18 ENCOUNTER — Other Ambulatory Visit: Payer: Self-pay | Admitting: Family Medicine

## 2023-03-18 DIAGNOSIS — Z1231 Encounter for screening mammogram for malignant neoplasm of breast: Secondary | ICD-10-CM

## 2023-04-21 ENCOUNTER — Ambulatory Visit
Admission: RE | Admit: 2023-04-21 | Discharge: 2023-04-21 | Disposition: A | Discharge status: Home / Self Care | Payer: No Typology Code available for payment source | Source: Ambulatory Visit | Attending: Family Medicine | Admitting: Family Medicine

## 2023-04-21 DIAGNOSIS — Z1231 Encounter for screening mammogram for malignant neoplasm of breast: Secondary | ICD-10-CM

## 2023-07-05 ENCOUNTER — Telehealth: Payer: Self-pay | Admitting: Nurse Practitioner

## 2023-07-05 NOTE — Telephone Encounter (Signed)
Inbound call from patient stating she started having rectal bleeding today. States she believes it is due to her diverticulosis. Patient requesting a call back to discuss and to be advised. Please advise, thank you.

## 2023-07-05 NOTE — Telephone Encounter (Addendum)
Patient reports 2 bloody bowel movements today and feels like she may be "having another one soon." Reports her abdomen is "uncomfortable and bloated " but "I don't know if I can call it pain. I haven't had this happen in a long time." No nausea or vomiting. The blood is before and after the stool. Her discomfort is in her LLQ. No bleeding between bowel movements. Please advise.

## 2023-07-07 ENCOUNTER — Emergency Department (HOSPITAL_COMMUNITY): Payer: No Typology Code available for payment source

## 2023-07-07 ENCOUNTER — Inpatient Hospital Stay (HOSPITAL_COMMUNITY)
Admission: EM | Admit: 2023-07-07 | Discharge: 2023-07-15 | DRG: 378 | Disposition: A | Discharge status: Home / Self Care | Payer: No Typology Code available for payment source | Attending: Family Medicine | Admitting: Family Medicine

## 2023-07-07 ENCOUNTER — Encounter (HOSPITAL_COMMUNITY): Payer: Self-pay

## 2023-07-07 ENCOUNTER — Other Ambulatory Visit: Payer: Self-pay

## 2023-07-07 DIAGNOSIS — G8929 Other chronic pain: Secondary | ICD-10-CM | POA: Diagnosis present

## 2023-07-07 DIAGNOSIS — Z90711 Acquired absence of uterus with remaining cervical stump: Secondary | ICD-10-CM

## 2023-07-07 DIAGNOSIS — K5731 Diverticulosis of large intestine without perforation or abscess with bleeding: Principal | ICD-10-CM | POA: Diagnosis present

## 2023-07-07 DIAGNOSIS — K5730 Diverticulosis of large intestine without perforation or abscess without bleeding: Secondary | ICD-10-CM

## 2023-07-07 DIAGNOSIS — K648 Other hemorrhoids: Secondary | ICD-10-CM

## 2023-07-07 DIAGNOSIS — E119 Type 2 diabetes mellitus without complications: Secondary | ICD-10-CM | POA: Diagnosis not present

## 2023-07-07 DIAGNOSIS — D497 Neoplasm of unspecified behavior of endocrine glands and other parts of nervous system: Secondary | ICD-10-CM | POA: Diagnosis present

## 2023-07-07 DIAGNOSIS — Z8719 Personal history of other diseases of the digestive system: Secondary | ICD-10-CM

## 2023-07-07 DIAGNOSIS — E249 Cushing's syndrome, unspecified: Secondary | ICD-10-CM | POA: Diagnosis present

## 2023-07-07 DIAGNOSIS — K5791 Diverticulosis of intestine, part unspecified, without perforation or abscess with bleeding: Secondary | ICD-10-CM

## 2023-07-07 DIAGNOSIS — Z79899 Other long term (current) drug therapy: Secondary | ICD-10-CM

## 2023-07-07 DIAGNOSIS — E785 Hyperlipidemia, unspecified: Secondary | ICD-10-CM | POA: Diagnosis present

## 2023-07-07 DIAGNOSIS — K219 Gastro-esophageal reflux disease without esophagitis: Secondary | ICD-10-CM | POA: Diagnosis present

## 2023-07-07 DIAGNOSIS — Z9049 Acquired absence of other specified parts of digestive tract: Secondary | ICD-10-CM

## 2023-07-07 DIAGNOSIS — E1159 Type 2 diabetes mellitus with other circulatory complications: Secondary | ICD-10-CM | POA: Diagnosis present

## 2023-07-07 DIAGNOSIS — Z6841 Body Mass Index (BMI) 40.0 and over, adult: Secondary | ICD-10-CM

## 2023-07-07 DIAGNOSIS — I10 Essential (primary) hypertension: Secondary | ICD-10-CM | POA: Diagnosis present

## 2023-07-07 DIAGNOSIS — Z833 Family history of diabetes mellitus: Secondary | ICD-10-CM

## 2023-07-07 DIAGNOSIS — K921 Melena: Principal | ICD-10-CM

## 2023-07-07 DIAGNOSIS — H0520 Unspecified exophthalmos: Secondary | ICD-10-CM | POA: Diagnosis present

## 2023-07-07 DIAGNOSIS — I152 Hypertension secondary to endocrine disorders: Secondary | ICD-10-CM | POA: Diagnosis present

## 2023-07-07 DIAGNOSIS — E1169 Type 2 diabetes mellitus with other specified complication: Secondary | ICD-10-CM

## 2023-07-07 DIAGNOSIS — K5733 Diverticulitis of large intestine without perforation or abscess with bleeding: Secondary | ICD-10-CM | POA: Insufficient documentation

## 2023-07-07 DIAGNOSIS — Z7985 Long-term (current) use of injectable non-insulin antidiabetic drugs: Secondary | ICD-10-CM

## 2023-07-07 DIAGNOSIS — D62 Acute posthemorrhagic anemia: Secondary | ICD-10-CM | POA: Diagnosis present

## 2023-07-07 DIAGNOSIS — K5900 Constipation, unspecified: Secondary | ICD-10-CM | POA: Diagnosis present

## 2023-07-07 DIAGNOSIS — D72829 Elevated white blood cell count, unspecified: Secondary | ICD-10-CM | POA: Diagnosis present

## 2023-07-07 DIAGNOSIS — Z7984 Long term (current) use of oral hypoglycemic drugs: Secondary | ICD-10-CM

## 2023-07-07 DIAGNOSIS — Z86010 Personal history of colonic polyps: Secondary | ICD-10-CM

## 2023-07-07 DIAGNOSIS — Z794 Long term (current) use of insulin: Secondary | ICD-10-CM

## 2023-07-07 DIAGNOSIS — K922 Gastrointestinal hemorrhage, unspecified: Secondary | ICD-10-CM | POA: Diagnosis present

## 2023-07-07 DIAGNOSIS — K5790 Diverticulosis of intestine, part unspecified, without perforation or abscess without bleeding: Secondary | ICD-10-CM

## 2023-07-07 DIAGNOSIS — Z8619 Personal history of other infectious and parasitic diseases: Secondary | ICD-10-CM

## 2023-07-07 DIAGNOSIS — Z8249 Family history of ischemic heart disease and other diseases of the circulatory system: Secondary | ICD-10-CM

## 2023-07-07 LAB — HEMOGLOBIN A1C
Hgb A1c MFr Bld: 6.9 % — ABNORMAL HIGH (ref 4.8–5.6)
Mean Plasma Glucose: 151.33 mg/dL

## 2023-07-07 LAB — COMPREHENSIVE METABOLIC PANEL
ALT: 15 U/L (ref 0–44)
AST: 16 U/L (ref 15–41)
Albumin: 4 g/dL (ref 3.5–5.0)
Alkaline Phosphatase: 54 U/L (ref 38–126)
Anion gap: 12 (ref 5–15)
BUN: 21 mg/dL (ref 8–23)
CO2: 25 mmol/L (ref 22–32)
Calcium: 9.9 mg/dL (ref 8.9–10.3)
Chloride: 99 mmol/L (ref 98–111)
Creatinine, Ser: 0.84 mg/dL (ref 0.44–1.00)
GFR, Estimated: >60 mL/min (ref >60)
Glucose, Bld: 117 mg/dL — ABNORMAL HIGH (ref 70–99)
Potassium: 3.7 mmol/L (ref 3.5–5.1)
Sodium: 136 mmol/L (ref 135–145)
Total Bilirubin: 0.5 mg/dL (ref 0.3–1.2)
Total Protein: 8 g/dL (ref 6.5–8.1)

## 2023-07-07 LAB — CBC
HCT: 43.4 % (ref 36.0–46.0)
Hemoglobin: 13.2 g/dL (ref 12.0–15.0)
MCH: 21.8 pg — ABNORMAL LOW (ref 26.0–34.0)
MCHC: 30.4 g/dL (ref 30.0–36.0)
MCV: 71.7 fL — ABNORMAL LOW (ref 80.0–100.0)
Platelets: 360 10*3/uL (ref 150–400)
RBC: 6.05 MIL/uL — ABNORMAL HIGH (ref 3.87–5.11)
RDW: 18 % — ABNORMAL HIGH (ref 11.5–15.5)
WBC: 11.8 10*3/uL — ABNORMAL HIGH (ref 4.0–10.5)
nRBC: 0 % (ref 0.0–0.2)

## 2023-07-07 LAB — POC OCCULT BLOOD, ED: Fecal Occult Bld: POSITIVE — AB

## 2023-07-07 LAB — SEDIMENTATION RATE: Sed Rate: 30 mm/hr — ABNORMAL HIGH (ref 0–22)

## 2023-07-07 LAB — TROPONIN I (HIGH SENSITIVITY)
Troponin I (High Sensitivity): 3 ng/L (ref <18)
Troponin I (High Sensitivity): 3 ng/L (ref <18)

## 2023-07-07 LAB — CBG MONITORING, ED: Glucose-Capillary: 97 mg/dL (ref 70–99)

## 2023-07-07 LAB — TYPE AND SCREEN
ABO/RH(D): O POS
Antibody Screen: NEGATIVE

## 2023-07-07 LAB — I-STAT CG4 LACTIC ACID, ED: Lactic Acid, Venous: 0.6 mmol/L (ref 0.5–1.9)

## 2023-07-07 MED ORDER — ONDANSETRON HCL 4 MG/2ML IJ SOLN: 4.0000 mg | Freq: Four times a day (QID) | INTRAMUSCULAR | Status: DC | PRN

## 2023-07-07 MED ORDER — ACETAMINOPHEN 325 MG PO TABS
650.0000 mg | ORAL_TABLET | Freq: Four times a day (QID) | ORAL | Status: DC | PRN
  Administered 2023-07-08 – 2023-07-15 (×4): 650 mg via ORAL
  Filled 2023-07-07 (×4): qty 2

## 2023-07-07 MED ORDER — POTASSIUM CHLORIDE IN NACL 20-0.9 MEQ/L-% IV SOLN
INTRAVENOUS | Status: DC
  Filled 2023-07-07 (×17): qty 1000

## 2023-07-07 MED ORDER — INSULIN ASPART 100 UNIT/ML IJ SOLN
0.0000 [IU] | INTRAMUSCULAR | Status: DC
  Administered 2023-07-08 – 2023-07-11 (×3): 2 [IU] via SUBCUTANEOUS
  Administered 2023-07-11: 1 [IU] via SUBCUTANEOUS
  Administered 2023-07-11: 3 [IU] via SUBCUTANEOUS
  Administered 2023-07-12: 2 [IU] via SUBCUTANEOUS
  Administered 2023-07-12: 1 [IU] via SUBCUTANEOUS
  Administered 2023-07-13 (×5): 2 [IU] via SUBCUTANEOUS
  Administered 2023-07-14: 3 [IU] via SUBCUTANEOUS
  Administered 2023-07-14 – 2023-07-15 (×4): 2 [IU] via SUBCUTANEOUS
  Filled 2023-07-07: qty 0.15

## 2023-07-07 MED ORDER — ONDANSETRON HCL 4 MG/2ML IJ SOLN
4.0000 mg | Freq: Once | INTRAMUSCULAR | Status: AC
  Administered 2023-07-07: 4 mg via INTRAVENOUS
  Filled 2023-07-07: qty 2

## 2023-07-07 MED ORDER — ACETAMINOPHEN 650 MG RE SUPP: 650.0000 mg | Freq: Four times a day (QID) | RECTAL | Status: DC | PRN

## 2023-07-07 MED ORDER — PANTOPRAZOLE SODIUM 40 MG IV SOLR
40.0000 mg | Freq: Two times a day (BID) | INTRAVENOUS | Status: DC
  Administered 2023-07-07 – 2023-07-15 (×16): 40 mg via INTRAVENOUS
  Filled 2023-07-07 (×16): qty 10

## 2023-07-07 MED ORDER — ONDANSETRON HCL 4 MG PO TABS: 4.0000 mg | ORAL_TABLET | Freq: Four times a day (QID) | ORAL | Status: DC | PRN

## 2023-07-07 MED ORDER — ALUM & MAG HYDROXIDE-SIMETH 200-200-20 MG/5ML PO SUSP
30.0000 mL | Freq: Once | ORAL | Status: AC
  Administered 2023-07-07: 30 mL via ORAL
  Filled 2023-07-07: qty 30

## 2023-07-07 MED ORDER — IOHEXOL 350 MG/ML SOLN
100.0000 mL | Freq: Once | INTRAVENOUS | Status: AC | PRN
  Administered 2023-07-07: 100 mL via INTRAVENOUS

## 2023-07-07 MED ORDER — LACTATED RINGERS IV BOLUS
500.0000 mL | Freq: Once | INTRAVENOUS | Status: AC
  Administered 2023-07-07: 500 mL via INTRAVENOUS

## 2023-07-07 NOTE — Telephone Encounter (Signed)
Inbound call from patient stating that she is still having issues and is requesting to speak with the nurse. Please advise.

## 2023-07-07 NOTE — H&P (Incomplete)
PCP:   [name], DO   Chief Complaint:  BRBPR  HPI: This is a 62 year old female with past medical history of pandiverticulosis, recurrent diverticular bleeding, T2DM, HTN.  She presents with complaints of blood in her stool and abdominal pain, first noted on Monday.  Following breakfast she had diarrheal episodes BRBPR.  Total of 3 during a course of the day.  She touch bases with her GI, went on bowel rest, not eating until she had a light supper Tuesday afternoon.  She woke this a.m. with dark red burgundy bloody diarrhea.  She touch base with her GI who recommended she go to the ER [note in chart].  Patient's last GI bleed episode was 2 years ago on the 21st, today makes exactly 2 years.  Patient endorses LUQ pain which she describes as burning and gassy.  She took Tums, then Mylanta and had some relief.  Per patient's her GERD has been getting worse over this past year.  She watches what she eats, has had to cut back on coffee and has cut out chocolate.  She does not eat spicy food, she is not a scheduled medication for GERD. She denies history of stomach ulcers.  Patient is not on blood thinners.  She endorses mild lightheadedness on Monday, none since  In the ER patient is hemodynamically stable.  Hemoglobin 13.2.  Per patient her hemoglobin was 14.1 during to physical a few weeks prior.  Patient has occult stool positive, burgundy. Last endoscopy is January 2022 pancolitis, internal hemorrhoids, 2 polyps removed with cold snare. 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  A few sessile polyps with no bleeding or stigmata of recent bleeding in the gastric fundus and body.  Capsule study done.  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   Diabetes mellitus without complication (HCC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   Diverticulitis    Diverticulosis    GI bleed    Hyperlipidemia     Hypertension    Polycystic ovary disease    Sepsis (HCC) 05/2019   Past Surgical History:  Procedure Laterality Date   ADRENALECTOMY  1996   BREAST BIOPSY Right 2018   BREAST BIOPSY Left    x2   CHOLECYSTECTOMY     COLONOSCOPY WITH PROPOFOL N/A 10/03/2019   Procedure: COLONOSCOPY WITH PROPOFOL;  Surgeon: Meryl Dare, MD;  Location: [facility];  Service: Endoscopy;  Laterality: N/A;   COLONOSCOPY WITH PROPOFOL N/A 11/22/2020   Procedure: COLONOSCOPY WITH PROPOFOL;  Surgeon: Meryl Dare, MD;  Location: [facility];  Service: Endoscopy;  Laterality: N/A;   ESOPHAGOGASTRODUODENOSCOPY (EGD) WITH PROPOFOL N/A 07/16/2015   Procedure: ESOPHAGOGASTRODUODENOSCOPY (EGD) WITH PROPOFOL;  Surgeon: Rachael Fee, MD;  [facility]: [facility];  Service: Endoscopy;  Laterality: N/A;   ESOPHAGOGASTRODUODENOSCOPY (EGD) WITH PROPOFOL N/A 11/28/2020   Procedure: ESOPHAGOGASTRODUODENOSCOPY (EGD) WITH PROPOFOL;  Surgeon: Shellia Cleverly, DO;  Location: [facility];  Service: Gastroenterology;  Laterality: N/A;   GIVENS CAPSULE STUDY N/A 11/28/2020   Procedure: GIVENS CAPSULE STUDY;  Surgeon: Shellia Cleverly, DO;  [facility]: [facility];  Service: Gastroenterology;  Laterality: N/A;   POLYPECTOMY  10/03/2019   Procedure: POLYPECTOMY;  Surgeon: Meryl Dare, MD;  Location: [facility];  Service: Endoscopy;;   POLYPECTOMY  11/22/2020   Procedure: POLYPECTOMY;  Surgeon: Meryl Dare, MD;  Location: [facility];  Service:  Endoscopy;;   B/L Ooprectomomy, tubal removal, RFA Lower lumbar pain  Medications: Prior to Admission medications   Medication Sig Start Date End Date Taking? Authorizing Provider  acetaminophen (TYLENOL) 325 MG tablet Take 2 tablets (650 mg total) by mouth every 6 (six) hours as needed for mild pain (or Fever >/= 101). Patient taking differently: Take 650 mg by mouth as needed for mild pain (or Fever >/= 101). 09/19/19   Berton Mount I, MD  atorvastatin (LIPITOR)  10 MG tablet Take 10 mg by mouth daily. 03/06/19   [provider]  calcium citrate-vitamin D (CITRACAL+D) 315-200 MG-UNIT tablet Take 1 tablet by mouth daily.    [provider]  carvedilol (COREG) 25 MG tablet Take 25 mg by mouth 2 (two) times daily. 10/30/20   [provider]  dicyclomine (BENTYL) 20 MG tablet Take 1 tablet (20 mg total) by mouth daily as needed for spasms. 12/28/22   Meredith Pel, NP  empagliflozin (JARDIANCE) 10 MG TABS tablet Take 10 mg by mouth daily.    [provider]  insulin aspart protamine - aspart (NOVOLOG MIX 70/30 FLEXPEN) (70-30) 100 UNIT/ML FlexPen Inject 20 units in the morning, and 30-40 at bedtime    [provider]  metformin (FORTAMET) 500 MG (OSM) 24 hr tablet Take 500 mg by mouth 2 (two) times daily with a meal.    [provider]  methocarbamol (ROBAXIN) 500 MG tablet Take 500 mg by mouth as needed for muscle spasms.    [provider]  Multiple Vitamins-Minerals (MULTIVITAMIN WITH MINERALS) tablet Take 1 tablet by mouth daily.    [provider]  olmesartan (BENICAR) 40 MG tablet Take 40 mg by mouth daily. 10/11/19   [provider]  Semaglutide,0.25 or 0.5MG /DOS, (OZEMPIC, 0.25 OR 0.5 MG/DOSE,) 2 MG/1.5ML SOPN Inject into the skin once a week.    [provider]  spironolactone (ALDACTONE) 50 MG tablet Take 50 mg by mouth daily.    [provider]    Allergies:   Allergies  Allergen Reactions   Norvasc [Amlodipine Besylate] Swelling    ANGIOEDEMA   Nsaids Other (See Comments)    GI upset/ GI bleed   Sulfa Antibiotics Other (See Comments)    childhood    Social History:  reports that she has never smoked. She has never used smokeless tobacco. She reports that she does not drink alcohol and does not use drugs.  Family History: Family History  Problem Relation Age of Onset   Hypertension Mother    Cancer Mother        ?uterine or ovarian  cancer   Hypertension Father    Diabetes Father    Hypertension Sister    Diabetes Sister    Hypertension Brother    Diabetes Brother    Hypertension Maternal Grandfather    Diabetes Paternal Grandmother    Breast cancer Maternal Grandmother    Colon cancer Neg Hx     Physical Exam: Vitals:   07/07/23 1504 07/07/23 1508 07/07/23 1901  BP: (!) 143/89  108/77  Pulse: 88  93  Resp: 17  18  Temp: 98.8 F (37.1 C)  98.3 F (36.8 C)  TempSrc: Oral  Oral  SpO2: 100%  95%  Weight:  105.7 kg   Height:  5\' 3"  (1.6 m)     General: A and O x 3, morbid obesity, no acute distress Eyes: Pink conjunctiva, no scleral icterus, ENT: Moist oral mucosa, neck supple, no thyromegaly Lungs:  CTA /L, no wheeze, no crackles, no use of accessory muscles Cardiovascular: RRR, no regurgitation, no gallops, no murmurs. No carotid bruits, no JVD Abdomen: soft, positive BS, positive greatest Left side and epigastric region, not an acute abdomen GU: not examined Neuro: CN II - XII grossly intact, sensation intact Musculoskeletal: strength 5/5 all extremities, no clubbing, cyanosis or edema Skin: no rash, no subcutaneous crepitation, no decubitus Psych: appropriate patient  Labs on Admission:  Recent Labs    07/07/23 1520  NA 136  K 3.7  CL 99  CO2 25  GLUCOSE 117*  BUN 21  CREATININE 0.84  CALCIUM 9.9   Recent Labs    07/07/23 1520  AST 16  ALT 15  ALKPHOS 54  BILITOT 0.5  PROT 8.0  ALBUMIN 4.0    Recent Labs    07/07/23 1520  WBC 11.8*  HGB 13.2  HCT 43.4  MCV 71.7*  PLT 360    Radiological Exams on Admission: CT Angio Abd/Pel W and/or Wo Contrast  Result Date: 07/07/2023 CLINICAL DATA:  History of GI bleed with several days of dark stools EXAM: CTA ABDOMEN AND PELVIS WITHOUT AND WITH CONTRAST TECHNIQUE: Multidetector CT imaging of the abdomen and pelvis was performed using the standard protocol during bolus administration of intravenous contrast. Multiplanar reconstructed  images and MIPs were obtained and reviewed to evaluate the vascular anatomy. RADIATION DOSE REDUCTION: This exam was performed according to the departmental dose-optimization program which includes automated exposure control, adjustment of the mA and/or kV according to patient size and/or use of iterative reconstruction technique. CONTRAST:  OMNIPAQUE IOHEXOL 350 MG/ML SOLN COMPARISON:  CTA abdomen and pelvis dated 07/08/2021 FINDINGS: VASCULAR Aorta: Aortic atherosclerosis. Normal caliber aorta without aneurysm, dissection, vasculitis or significant stenosis. Celiac: Patent without evidence of aneurysm, dissection, vasculitis or significant stenosis. SMA: Patent without evidence of aneurysm, dissection, vasculitis or significant stenosis. Renals: Both renal arteries are patent without evidence of aneurysm, dissection, vasculitis, fibromuscular dysplasia or significant stenosis. IMA: Patent without evidence of aneurysm, dissection, vasculitis or significant stenosis. Inflow: Patent without evidence of aneurysm, dissection, vasculitis or significant stenosis. Proximal Outflow: Bilateral common femoral and visualized portions of the superficial and profunda femoral arteries are patent without evidence of aneurysm, dissection, vasculitis or significant stenosis. Veins: No obvious venous abnormality within the limitations of this arterial phase study. Review of the MIP images confirms the above findings. NON-VASCULAR Lower chest: No focal consolidation or pulmonary nodule in the lung bases. No pleural effusion or pneumothorax demonstrated. Partially imaged heart size is normal. Hepatobiliary: No focal hepatic lesions. No intra or extrahepatic biliary ductal dilation. Cholecystectomy. Pancreas: No focal lesions or main ductal dilation. Spleen: Normal in size without focal abnormality. 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 No left adrenal nodules. No hydronephrosis or calculi.  Bilateral simple cysts. Additional hypoattenuating foci do not demonstrate appreciable enhancement, likely hemorrhagic/proteinaceous cyst. No specific follow-up imaging recommended. No focal bladder wall thickening. Stomach/Bowel: Normal appearance of the stomach. No evidence of bowel wall thickening, distention, or inflammatory changes. Extensive colonic diverticulosis without acute diverticulitis. Normal appendix. Lymphatic: No enlarged abdominal or pelvic lymph nodes. Reproductive: Multifocal exophytic masses arising from the uterus with coarse heterogeneous calcifications, likely leiomyomas. No adnexal masses. Other: No free fluid, fluid collection, or free air. Musculoskeletal: No acute or abnormal lytic or blastic osseous lesions. Multilevel degenerative changes of the partially imaged thoracic and lumbar spine. Small fat-containing right inguinal hernia. IMPRESSION: 1. No evidence of active GI bleed. 2. Extensive colonic diverticulosis without  acute diverticulitis. 3.  Aortic Atherosclerosis (ICD10-I70.0). Electronically Signed   By: Agustin Cree M.D.   On: 07/07/2023 18:34    Assessment/Plan Present on Admission:  Recurrent diverticular bleeding in patient with pan diverticulosis //  h/o internal hemorrhoids //  adenomatous polyps -N.p.o., IV fluid hydration -Serial H&H every 8 hours.  IV fluid hydration -IV Protonix twice daily ordered given darkened stool, progressively worsening GERD, epigastric tenderness. -[facility] on-call aware.  Contacted via epic chat -CT angio abdomen and pelvis without evidence of active GI bleed.  Extensive colonic diverticulosis without acute diverticulitis noted   Essential hypertension -Coreg, Benicar resumed.  First doses in a.m.   T2DM -Sliding scale insulin, 70/30 resumed at 50% dose.  NPO. -Ozempic Qsunday   Cushing's disease, h/o R adrenalectomy  Ardine Iacovelli 07/07/2023, 9:21 PM

## 2023-07-07 NOTE — ED Notes (Signed)
ED TO INPATIENT HANDOFF REPORT  ED Nurse Name and Phone #: Huntley Dec [phone]  S Name/Age/Gender Carol Mueller 62 y.o. female Room/Bed: WA05/WA05  Code Status   Code Status: Prior  Home/SNF/Other Patient oriented to: self, place, time, and situation Is this baseline? Yes   Triage Complete: Triage complete  Chief Complaint Diverticulitis of colon with hemorrhage [K57.33]  Triage Note Pt coming in complaining of bloody stools, states it started Monday. Pt states the dark has been dark, and has hx of GI bleeds. Denies blood thinner use.    Allergies Allergies  Allergen Reactions   Norvasc [Amlodipine Besylate] Swelling    ANGIOEDEMA   Nsaids Other (See Comments)    GI upset/ GI bleed   Sulfa Antibiotics Other (See Comments)    childhood    Level of Care/Admitting Diagnosis ED Disposition     ED Disposition  Admit   Condition  --   Comment  [facility] [[id number]]  Level of Care: Med-Surg [16]  May place patient in observation at [facility] or [facility] if equivalent level of care is available:: Yes  Covid Evaluation: Asymptomatic - no recent exposure (last 10 days) testing not required  Diagnosis: Diverticulitis of colon with hemorrhage [562.13.ICD-9-CM]  Admitting Physician: Gery Pray [[id number]]  Attending Physician: Alvester Chou          B Medical/Surgery History 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   Diabetes mellitus without complication (HCC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   Diverticulitis    Diverticulosis    GI bleed    Hyperlipidemia    Hypertension    Polycystic ovary disease    Sepsis (HCC) 05/2019   Past Surgical History:  Procedure Laterality Date   ADRENALECTOMY  1996   BREAST BIOPSY Right 2018   BREAST BIOPSY Left    x2   CHOLECYSTECTOMY     COLONOSCOPY WITH PROPOFOL N/A 10/03/2019   Procedure: COLONOSCOPY WITH PROPOFOL;  Surgeon:  Meryl Dare, MD;  Location: [facility];  Service: Endoscopy;  Laterality: N/A;   COLONOSCOPY WITH PROPOFOL N/A 11/22/2020   Procedure: COLONOSCOPY WITH PROPOFOL;  Surgeon: Meryl Dare, MD;  [facility];  Service: Endoscopy;  Laterality: N/A;   ESOPHAGOGASTRODUODENOSCOPY (EGD) WITH PROPOFOL N/A 07/16/2015   Procedure: ESOPHAGOGASTRODUODENOSCOPY (EGD) WITH PROPOFOL;  Surgeon: Rachael Fee, MD;  [facility];  Service: Endoscopy;  Laterality: N/A;   ESOPHAGOGASTRODUODENOSCOPY (EGD) WITH PROPOFOL N/A 11/28/2020   Procedure: ESOPHAGOGASTRODUODENOSCOPY (EGD) WITH PROPOFOL;  Surgeon: Shellia Cleverly, DO;  Location: [facility];  Service: Gastroenterology;  Laterality: N/A;   GIVENS CAPSULE STUDY N/A 11/28/2020   Procedure: GIVENS CAPSULE STUDY;  Surgeon: Shellia Cleverly, DO;  [facility];  Service: Gastroenterology;  Laterality: N/A;   POLYPECTOMY  10/03/2019   Procedure: POLYPECTOMY;  Surgeon: Meryl Dare, MD;  Location: [facility];  Service: Endoscopy;;   POLYPECTOMY  11/22/2020   Procedure: POLYPECTOMY;  Surgeon: Meryl Dare, MD;  Location: [facility];  Service: Endoscopy;;     A IV Location/Drains/Wounds Patient Lines/Drains/Airways Status     Active Line/Drains/Airways     Name Placement date Placement time Site Days   Peripheral IV 07/07/23 20 G Left Antecubital 07/07/23  1720  Antecubital  less than 1            Intake/Output Last 24 hours  Intake/Output Summary (Last 24 hours) at  07/07/2023 2148 Last data filed at 07/07/2023 1926 Gross per 24 hour  Intake 500 ml  Output --  Net 500 ml    Labs/Imaging Results for orders placed or performed during the hospital encounter of 07/07/23 (from the past 48 hour(s))  Comprehensive metabolic panel     Status: Abnormal   Collection Time: 07/07/23  3:20 PM  Result Value Ref Range   Sodium 136 135 - 145 mmol/L   Potassium 3.7 3.5 - 5.1 mmol/L   Chloride 99 98 - 111 mmol/L    CO2 25 22 - 32 mmol/L   Glucose, Bld 117 (H) 70 - 99 mg/dL    Comment: Glucose reference range applies only to samples taken after fasting for at least 8 hours.   BUN 21 8 - 23 mg/dL   Creatinine, Ser 8.11 0.44 - 1.00 mg/dL   Calcium 9.9 8.9 - 91.4 mg/dL   Total Protein 8.0 6.5 - 8.1 g/dL   Albumin 4.0 3.5 - 5.0 g/dL   AST 16 15 - 41 U/L   ALT 15 0 - 44 U/L   Alkaline Phosphatase 54 38 - 126 U/L   Total Bilirubin 0.5 0.3 - 1.2 mg/dL   GFR, Estimated >78 >29 mL/min    Comment: (NOTE) Calculated using the CKD-EPI Creatinine Equation (2021)    Anion gap 12 5 - 15    Comment: Performed at [facility], [address] [place], [place] [address]  CBC     Status: Abnormal   Collection Time: 07/07/23  3:20 PM  Result Value Ref Range   WBC 11.8 (H) 4.0 - 10.5 K/uL   RBC 6.05 (H) 3.87 - 5.11 MIL/uL   Hemoglobin 13.2 12.0 - 15.0 g/dL   HCT 08.6 57.8 - 46.9 %   MCV 71.7 (L) 80.0 - 100.0 fL   MCH 21.8 (L) 26.0 - 34.0 pg   MCHC 30.4 30.0 - 36.0 g/dL   RDW 62.9 (H) 52.8 - 41.3 %   Platelets 360 150 - 400 K/uL   nRBC 0.0 0.0 - 0.2 %    Comment: Performed at [facility], [address] [place], [place] [address]  Type and screen [facility]     Status: None   Collection Time: 07/07/23  3:20 PM  Result Value Ref Range   ABO/RH(D) O POS    Antibody Screen NEG    Sample Expiration      07/10/2023,2359 Performed at [facility], [address], [place], [place] [address]   Troponin I (High Sensitivity)     Status: None   Collection Time: 07/07/23  3:20 PM  Result Value Ref Range   Troponin I (High Sensitivity) 3 <18 ng/L    Comment: (NOTE) Elevated high sensitivity troponin I (hsTnI) values and significant  changes across serial measurements may suggest ACS but many other  chronic and acute conditions are known to elevate hsTnI results.  Refer to the "Links" section for chest pain algorithms and additional   guidance. Performed at [facility], [address], [place], [place] [address]   POC occult blood, ED     Status: Abnormal   Collection Time: 07/07/23  7:16 PM  Result Value Ref Range   Fecal Occult Bld POSITIVE (A) NEGATIVE  I-Stat CG4 Lactic Acid     Status: None   Collection Time: 07/07/23  9:45 PM  Result Value Ref Range   Lactic Acid, Venous 0.6 0.5 - 1.9 mmol/L  CT Angio Abd/Pel W and/or Wo Contrast  Result Date: 07/07/2023 CLINICAL DATA:  History of GI bleed with several days of dark stools EXAM: CTA ABDOMEN AND PELVIS WITHOUT AND WITH CONTRAST TECHNIQUE: Multidetector CT imaging of the abdomen and pelvis was performed using the standard protocol during bolus administration of intravenous contrast. Multiplanar reconstructed images and MIPs were obtained and reviewed to evaluate the vascular anatomy. RADIATION DOSE REDUCTION: This exam was performed according to the departmental dose-optimization program which includes automated exposure control, adjustment of the mA and/or kV according to patient size and/or use of iterative reconstruction technique. CONTRAST:  OMNIPAQUE IOHEXOL 350 MG/ML SOLN COMPARISON:  CTA abdomen and pelvis dated 07/08/2021 FINDINGS: VASCULAR Aorta: Aortic atherosclerosis. Normal caliber aorta without aneurysm, dissection, vasculitis or significant stenosis. Celiac: Patent without evidence of aneurysm, dissection, vasculitis or significant stenosis. SMA: Patent without evidence of aneurysm, dissection, vasculitis or significant stenosis. Renals: Both renal arteries are patent without evidence of aneurysm, dissection, vasculitis, fibromuscular dysplasia or significant stenosis. IMA: Patent without evidence of aneurysm, dissection, vasculitis or significant stenosis. Inflow: Patent without evidence of aneurysm, dissection, vasculitis or significant stenosis. Proximal Outflow: Bilateral common femoral and visualized portions of the superficial and  profunda femoral arteries are patent without evidence of aneurysm, dissection, vasculitis or significant stenosis. Veins: No obvious venous abnormality within the limitations of this arterial phase study. Review of the MIP images confirms the above findings. NON-VASCULAR Lower chest: No focal consolidation or pulmonary nodule in the lung bases. No pleural effusion or pneumothorax demonstrated. Partially imaged heart size is normal. Hepatobiliary: No focal hepatic lesions. No intra or extrahepatic biliary ductal dilation. Cholecystectomy. Pancreas: No focal lesions or main ductal dilation. Spleen: Normal in size without focal abnormality. 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 No left adrenal nodules. No hydronephrosis or calculi. Bilateral simple cysts. Additional hypoattenuating foci do not demonstrate appreciable enhancement, likely hemorrhagic/proteinaceous cyst. No specific follow-up imaging recommended. No focal bladder wall thickening. Stomach/Bowel: Normal appearance of the stomach. No evidence of bowel wall thickening, distention, or inflammatory changes. Extensive colonic diverticulosis without acute diverticulitis. Normal appendix. Lymphatic: No enlarged abdominal or pelvic lymph nodes. Reproductive: Multifocal exophytic masses arising from the uterus with coarse heterogeneous calcifications, likely leiomyomas. No adnexal masses. Other: No free fluid, fluid collection, or free air. Musculoskeletal: No acute or abnormal lytic or blastic osseous lesions. Multilevel degenerative changes of the partially imaged thoracic and lumbar spine. Small fat-containing right inguinal hernia. IMPRESSION: 1. No evidence of active GI bleed. 2. Extensive colonic diverticulosis without acute diverticulitis. 3.  Aortic Atherosclerosis (ICD10-I70.0). Electronically Signed   By: Agustin Cree M.D.   On: 07/07/2023 18:34    Pending Labs Unresulted Labs (From admission, onward)     Start      Ordered   07/08/23 0600  Hemoglobin and hematocrit, blood  Now then every 8 hours,   R      07/07/23 2146   07/07/23 2112  Sedimentation rate  Once,   URGENT        07/07/23 2111            Vitals/Pain Today's Vitals   07/07/23 1504 07/07/23 1507 07/07/23 1508 07/07/23 1901  BP: (!) 143/89   108/77  Pulse: 88   93  Resp: 17   18  Temp: 98.8 F (37.1 C)   98.3 F (36.8 C)  TempSrc: Oral   Oral  SpO2: 100%   95%  Weight:   105.7 kg   Height:   5\' 3"  (  1.6 m)   PainSc:  4       Isolation Precautions No active isolations  Medications Medications  pantoprazole (PROTONIX) injection 40 mg (has no administration in time range)  0.9 % NaCl with KCl 20 mEq/ L  infusion (has no administration in time range)  lactated ringers bolus 500 mL (0 mLs Intravenous Stopped 07/07/23 1926)  iohexol (OMNIPAQUE) 350 MG/ML injection 100 mL (100 mLs Intravenous Contrast Given 07/07/23 1736)  ondansetron (ZOFRAN) injection 4 mg (4 mg Intravenous Given 07/07/23 1846)  alum & mag hydroxide-simeth (MAALOX/MYLANTA) 200-200-20 MG/5ML suspension 30 mL (30 mLs Oral Given 07/07/23 1926)    Mobility walks     Focused Assessments  R Recommendations: See Admitting Provider Note  Report given to:   Additional Notes:

## 2023-07-07 NOTE — ED Triage Notes (Signed)
Pt coming in complaining of bloody stools, states it started Monday. Pt states the dark has been dark, and has hx of GI bleeds. Denies blood thinner use.

## 2023-07-07 NOTE — Telephone Encounter (Signed)
Spoke with patient. She had 3 episodes of bloody stool on Monday, 2 days ago. Stool was Tupper but blood was red. Yesterday she had another bloody BM. Today she has already had three bloody BMs and now stool itself is burgundy in color. Not really having abdominal pain. Feels okay overall. She has hx of recurrent lower GI bleeding ( presumed diverticular bleeding) requiring transfusions. In light of this hx I advised ED evaluation. Will alert inpatient teams.

## 2023-07-07 NOTE — Progress Notes (Signed)
   07/07/23 2325  BiPAP/CPAP/SIPAP  $ Non-Invasive Home Ventilator  Initial  BiPAP/CPAP/SIPAP Pt Type Adult  BiPAP/CPAP/SIPAP DREAMSTATIOND  Mask Type Nasal mask  Mask Size Medium  Respiratory Rate 16 breaths/min  FiO2 (%) 21 %  Patient Home Equipment No  Auto Titrate Yes (AUTOMODE, MIN4CM, MAX20CM H2O)  CPAP/SIPAP surface wiped down Yes  BiPAP/CPAP /SiPAP Vitals  Pulse Rate 60  Resp 16  SpO2 95 %

## 2023-07-07 NOTE — ED Provider Notes (Signed)
[place] EMERGENCY DEPARTMENT AT [facility] Provider Note   CSN: [id number] Arrival date & time: 07/07/23  1458     History  Chief Complaint  Patient presents with   Melena    Carol Mueller is a 62 y.o. female.  HPI Patient presents for rectal bleeding.  Medical history includes DM, HTN, HLD, diverticulosis, anemia.  2 years ago, she recurrent lower GI bleeds.  Last colonoscopy was January 2022.  2 days ago, she had red bloody stools.  She has mild dizziness and shortness of breath on that day but this is since resolved.  Yesterday, stools turned to a dark color.  Today, she had recurrence of red stool.  She had 3 episodes of this.  Last episode was 2:30 PM.  She is not on a blood thinner.  She has had some mild abdominal discomfort.  She denies any other recent symptoms.      Home Medications Prior to Admission medications   Medication Sig Start Date End Date Taking? Authorizing Provider  acetaminophen (TYLENOL) 325 MG tablet Take 2 tablets (650 mg total) by mouth every 6 (six) hours as needed for mild pain (or Fever >/= 101). Patient taking differently: Take 650 mg by mouth as needed for mild pain (or Fever >/= 101). 09/19/19   Berton Mount I, MD  atorvastatin (LIPITOR) 10 MG tablet Take 10 mg by mouth daily. 03/06/19   [provider]  calcium citrate-vitamin D (CITRACAL+D) 315-200 MG-UNIT tablet Take 1 tablet by mouth daily.    [provider]  carvedilol (COREG) 25 MG tablet Take 25 mg by mouth 2 (two) times daily. 10/30/20   [provider]  dicyclomine (BENTYL) 20 MG tablet Take 1 tablet (20 mg total) by mouth daily as needed for spasms. 12/28/22   Meredith Pel, NP  empagliflozin (JARDIANCE) 10 MG TABS tablet Take 10 mg by mouth daily.    [provider]  insulin aspart protamine - aspart (NOVOLOG MIX 70/30 FLEXPEN) (70-30) 100 UNIT/ML FlexPen Inject 20 units in the morning, and 30-40 at bedtime    [provider]  metformin (FORTAMET) 500 MG (OSM) 24 hr tablet Take 500 mg by mouth 2 (two) times daily with a meal.    [provider]  methocarbamol (ROBAXIN) 500 MG tablet Take 500 mg by mouth as needed for muscle spasms.    [provider]  Multiple Vitamins-Minerals (MULTIVITAMIN WITH MINERALS) tablet Take 1 tablet by mouth daily.    [provider]  olmesartan (BENICAR) 40 MG tablet Take 40 mg by mouth daily. 10/11/19   [provider]  Semaglutide,0.25 or 0.5MG /DOS, (OZEMPIC, 0.25 OR 0.5 MG/DOSE,) 2 MG/1.5ML SOPN Inject into the skin once a week.    [provider]  spironolactone (ALDACTONE) 50 MG tablet Take 50 mg by mouth daily.    [provider]      Allergies    Norvasc [amlodipine besylate], Nsaids, and Sulfa antibiotics    Review of Systems   Review of Systems  Gastrointestinal:  Positive for abdominal pain and anal bleeding.  All other systems reviewed and are negative.   Physical Exam Updated Vital Signs BP 108/77 (BP Location: Right Arm)   Pulse 93   Temp 98.3 F (36.8 C) (Oral)   Resp 18   Ht 5\' 3"  (1.6 m)   Wt 105.7 kg   SpO2 95%   BMI 41.27 kg/m  Physical Exam Vitals and nursing note reviewed.  Constitutional:  General: She is not in acute distress.    Appearance: Normal appearance. She is well-developed. She is not ill-appearing, toxic-appearing or diaphoretic.  HENT:     Head: Normocephalic and atraumatic.     Right Ear: External ear normal.     Left Ear: External ear normal.     Nose: Nose normal.     Mouth/Throat:     Mouth: Mucous membranes are moist.  Eyes:     Extraocular Movements: Extraocular movements intact.     Conjunctiva/sclera: Conjunctivae normal.  Cardiovascular:     Rate and Rhythm: Normal rate and regular rhythm.  Pulmonary:     Effort: Pulmonary effort is normal. No respiratory distress.  Abdominal:     General: There is no distension.     Palpations: Abdomen is soft.      Tenderness: There is abdominal tenderness. There is no guarding or rebound.  Musculoskeletal:        General: No swelling. Normal range of motion.     Cervical back: Neck supple.     Right lower leg: No edema.     Left lower leg: No edema.  Skin:    General: Skin is warm and dry.     Coloration: Skin is not jaundiced or pale.  Neurological:     General: No focal deficit present.     Mental Status: She is alert and oriented to person, place, and time.  Psychiatric:        Mood and Affect: Mood normal.        Behavior: Behavior normal.     ED Results / Procedures / Treatments   Labs (all labs ordered are listed, but only abnormal results are displayed) Labs Reviewed  COMPREHENSIVE METABOLIC PANEL - Abnormal; Notable for the following components:      Result Value   Glucose, Bld 117 (*)    All other components within normal limits  CBC - Abnormal; Notable for the following components:   WBC 11.8 (*)    RBC 6.05 (*)    MCV 71.7 (*)    MCH 21.8 (*)    RDW 18.0 (*)    All other components within normal limits  POC OCCULT BLOOD, ED - Abnormal; Notable for the following components:   Fecal Occult Bld POSITIVE (*)    All other components within normal limits  TYPE AND SCREEN  TROPONIN I (HIGH SENSITIVITY)  TROPONIN I (HIGH SENSITIVITY)    EKG EKG Interpretation Date/Time:  Wednesday July 07 2023 19:25:04 EDT Ventricular Rate:  85 PR Interval:  149 QRS Duration:  79 QT Interval:  355 QTC Calculation: 423 R Axis:   7  Text Interpretation: Sinus rhythm Ventricular premature complex Low voltage, precordial leads Borderline T abnormalities, anterior leads Confirmed by Gloris Manchester ([id number]) on 07/07/2023 8:42:53 PM  Radiology CT Angio Abd/Pel W and/or Wo Contrast  Result Date: 07/07/2023 CLINICAL DATA:  History of GI bleed with several days of dark stools EXAM: CTA ABDOMEN AND PELVIS WITHOUT AND WITH CONTRAST TECHNIQUE: Multidetector CT imaging of the abdomen and pelvis was  performed using the standard protocol during bolus administration of intravenous contrast. Multiplanar reconstructed images and MIPs were obtained and reviewed to evaluate the vascular anatomy. RADIATION DOSE REDUCTION: This exam was performed according to the departmental dose-optimization program which includes automated exposure control, adjustment of the mA and/or kV according to patient size and/or use of iterative reconstruction technique. CONTRAST:  OMNIPAQUE IOHEXOL 350 MG/ML SOLN COMPARISON:  CTA abdomen and pelvis dated  07/08/2021 FINDINGS: VASCULAR Aorta: Aortic atherosclerosis. Normal caliber aorta without aneurysm, dissection, vasculitis or significant stenosis. Celiac: Patent without evidence of aneurysm, dissection, vasculitis or significant stenosis. SMA: Patent without evidence of aneurysm, dissection, vasculitis or significant stenosis. Renals: Both renal arteries are patent without evidence of aneurysm, dissection, vasculitis, fibromuscular dysplasia or significant stenosis. IMA: Patent without evidence of aneurysm, dissection, vasculitis or significant stenosis. Inflow: Patent without evidence of aneurysm, dissection, vasculitis or significant stenosis. Proximal Outflow: Bilateral common femoral and visualized portions of the superficial and profunda femoral arteries are patent without evidence of aneurysm, dissection, vasculitis or significant stenosis. Veins: No obvious venous abnormality within the limitations of this arterial phase study. Review of the MIP images confirms the above findings. NON-VASCULAR Lower chest: No focal consolidation or pulmonary nodule in the lung bases. No pleural effusion or pneumothorax demonstrated. Partially imaged heart size is normal. Hepatobiliary: No focal hepatic lesions. No intra or extrahepatic biliary ductal dilation. Cholecystectomy. Pancreas: No focal lesions or main ductal dilation. Spleen: Normal in size without focal abnormality.  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 No left adrenal nodules. No hydronephrosis or calculi. Bilateral simple cysts. Additional hypoattenuating foci do not demonstrate appreciable enhancement, likely hemorrhagic/proteinaceous cyst. No specific follow-up imaging recommended. No focal bladder wall thickening. Stomach/Bowel: Normal appearance of the stomach. No evidence of bowel wall thickening, distention, or inflammatory changes. Extensive colonic diverticulosis without acute diverticulitis. Normal appendix. Lymphatic: No enlarged abdominal or pelvic lymph nodes. Reproductive: Multifocal exophytic masses arising from the uterus with coarse heterogeneous calcifications, likely leiomyomas. No adnexal masses. Other: No free fluid, fluid collection, or free air. Musculoskeletal: No acute or abnormal lytic or blastic osseous lesions. Multilevel degenerative changes of the partially imaged thoracic and lumbar spine. Small fat-containing right inguinal hernia. IMPRESSION: 1. No evidence of active GI bleed. 2. Extensive colonic diverticulosis without acute diverticulitis. 3.  Aortic Atherosclerosis (ICD10-I70.0). Electronically Signed   By: Agustin Cree M.D.   On: 07/07/2023 18:34    Procedures Procedures    Medications Ordered in ED Medications  lactated ringers bolus 500 mL (0 mLs Intravenous Stopped 07/07/23 1926)  iohexol (OMNIPAQUE) 350 MG/ML injection 100 mL (100 mLs Intravenous Contrast Given 07/07/23 1736)  ondansetron (ZOFRAN) injection 4 mg (4 mg Intravenous Given 07/07/23 1846)  alum & mag hydroxide-simeth (MAALOX/MYLANTA) 200-200-20 MG/5ML suspension 30 mL (30 mLs Oral Given 07/07/23 1926)    ED Course/ Medical Decision Making/ A&P                                 Medical Decision Making Amount and/or Complexity of Data Reviewed Labs: ordered. Radiology: ordered.  Risk OTC drugs. Prescription drug management.   This patient presents to the ED for concern of  hematochezia, this involves an extensive number of treatment options, and is a complaint that carries with it a high risk of complications and morbidity.  The differential diagnosis includes internal hemorrhoid, diverticular bleed, colitis, neoplasm, upper GI bleed with rapid transit   Co morbidities that complicate the patient evaluation  DM, HTN, HLD, diverticulosis, anemia   Additional history obtained:  Additional history obtained from N/A External records from outside source obtained and reviewed including EMR   Lab Tests:  I Ordered, and personally interpreted labs.  The pertinent results include: Small drop in hemoglobin at baseline, mild leukocytosis, normal kidney function, normal electrolytes   Imaging Studies ordered:  I ordered imaging studies including CTA of abdomen and pelvis  I independently visualized and interpreted imaging which showed no acute findings, redemonstration of known diverticulosis I agree with the radiologist interpretation   Cardiac Monitoring: / EKG:  The patient was maintained on a cardiac monitor.  I personally viewed and interpreted the cardiac monitored which showed an underlying rhythm of: Sinus rhythm   Consultations Obtained:  I requested consultation with the gastroenterologist, Dr. Marina Goodell,  and discussed lab and imaging findings as well as pertinent plan - they recommend: GI team will see in consult in the a.m.   Problem List / ED Course / Critical interventions / Medication management  Patient presents for 3 days of hematochezia.  She has a history of the same.  Prior to this week, most recent episode was 2 years ago.  Patient had some initial dizziness and shortness of breath but this has resolved over the past 2 days.  Vital signs on arrival are normal.  Patient is well-appearing on exam.  Her abdomen is soft.  She endorses some mild generalized tenderness.  Patient states that she had routine lab work done 3 weeks ago.  Hemoglobin at  the time was 14.1.  Today it is 13.2.  Lab work is otherwise notable for mild leukocytosis.  Given ongoing blood loss today, there may be a lag in hemoglobin drop.  CTA showed no acute findings.  While in the ED, patient developed nausea and vomiting.  She denies presence of blood in her vomitus.  She was given Zofran.  She describes a burning sensation under her left breast.  Will check troponins and provide GI cocktail.  On DRE, patient has scant amount of gross red blood.  No internal hemorrhoids are appreciated.  I spoke with gastroenterologist, Dr. Marina Goodell, who states that GI team will see the patient in consult in the morning.  EKG and troponin are reassuring.  Patient was admitted for further management. I ordered medication including IV fluids for hydration; Zofran for nausea; Maalox for GI upset Reevaluation of the patient after these medicines showed that the patient improved I have reviewed the patients home medicines and have made adjustments as needed   Social Determinants of Health:  Has access to outpatient care         Final Clinical Impression(s) / ED Diagnoses Final diagnoses:  Hematochezia    Rx / DC Orders ED Discharge Orders     None         Gloris Manchester, MD 07/07/23 2044

## 2023-07-08 DIAGNOSIS — K5731 Diverticulosis of large intestine without perforation or abscess with bleeding: Secondary | ICD-10-CM

## 2023-07-08 DIAGNOSIS — D62 Acute posthemorrhagic anemia: Secondary | ICD-10-CM

## 2023-07-08 DIAGNOSIS — K625 Hemorrhage of anus and rectum: Secondary | ICD-10-CM | POA: Diagnosis not present

## 2023-07-08 DIAGNOSIS — E119 Type 2 diabetes mellitus without complications: Secondary | ICD-10-CM | POA: Diagnosis not present

## 2023-07-08 DIAGNOSIS — I10 Essential (primary) hypertension: Secondary | ICD-10-CM | POA: Diagnosis not present

## 2023-07-08 LAB — FOLATE: Folate: 14.6 ng/mL (ref >5.9)

## 2023-07-08 LAB — BASIC METABOLIC PANEL
Anion gap: 10 (ref 5–15)
BUN: 20 mg/dL (ref 8–23)
CO2: 26 mmol/L (ref 22–32)
Calcium: 9.3 mg/dL (ref 8.9–10.3)
Chloride: 101 mmol/L (ref 98–111)
Creatinine, Ser: 0.85 mg/dL (ref 0.44–1.00)
GFR, Estimated: >60 mL/min (ref >60)
Glucose, Bld: 104 mg/dL — ABNORMAL HIGH (ref 70–99)
Potassium: 3.6 mmol/L (ref 3.5–5.1)
Sodium: 137 mmol/L (ref 135–145)

## 2023-07-08 LAB — IRON AND TIBC
Iron: 70 ug/dL (ref 28–170)
Saturation Ratios: 23 % (ref 10.4–31.8)
TIBC: 304 ug/dL (ref 250–450)
UIBC: 234 ug/dL

## 2023-07-08 LAB — GLUCOSE, CAPILLARY
Glucose-Capillary: 114 mg/dL — ABNORMAL HIGH (ref 70–99)
Glucose-Capillary: 135 mg/dL — ABNORMAL HIGH (ref 70–99)
Glucose-Capillary: 102 mg/dL — ABNORMAL HIGH (ref 70–99)
Glucose-Capillary: 83 mg/dL (ref 70–99)
Glucose-Capillary: 93 mg/dL (ref 70–99)

## 2023-07-08 LAB — VITAMIN B12: Vitamin B-12: 315 pg/mL (ref 180–914)

## 2023-07-08 LAB — PROTIME-INR
INR: 1.1 (ref 0.8–1.2)
Prothrombin Time: 13.9 s (ref 11.4–15.2)

## 2023-07-08 LAB — HEMOGLOBIN AND HEMATOCRIT, BLOOD
HCT: 36.2 % (ref 36.0–46.0)
HCT: 34.8 % — ABNORMAL LOW (ref 36.0–46.0)
HCT: 33 % — ABNORMAL LOW (ref 36.0–46.0)
Hemoglobin: 11 g/dL — ABNORMAL LOW (ref 12.0–15.0)
Hemoglobin: 10.3 g/dL — ABNORMAL LOW (ref 12.0–15.0)
Hemoglobin: 10 g/dL — ABNORMAL LOW (ref 12.0–15.0)

## 2023-07-08 MED ORDER — INSULIN ASPART PROT & ASPART (70-30 MIX) 100 UNIT/ML ~~LOC~~ SUSP
5.0000 [IU] | Freq: Every day | SUBCUTANEOUS | Status: DC
  Administered 2023-07-15: 5 [IU] via SUBCUTANEOUS
  Filled 2023-07-08: qty 10

## 2023-07-08 MED ORDER — METHOCARBAMOL 500 MG PO TABS
500.0000 mg | ORAL_TABLET | Freq: Three times a day (TID) | ORAL | Status: DC | PRN
  Administered 2023-07-15: 500 mg via ORAL
  Filled 2023-07-08: qty 1

## 2023-07-08 MED ORDER — OXYCODONE HCL 5 MG PO TABS
5.0000 mg | ORAL_TABLET | Freq: Four times a day (QID) | ORAL | Status: DC | PRN
  Administered 2023-07-09 – 2023-07-13 (×3): 5 mg via ORAL
  Filled 2023-07-08 (×4): qty 1

## 2023-07-08 MED ORDER — MORPHINE SULFATE (PF) 2 MG/ML IV SOLN
2.0000 mg | INTRAVENOUS | Status: DC | PRN
  Administered 2023-07-08: 2 mg via INTRAVENOUS
  Filled 2023-07-08: qty 1

## 2023-07-08 MED ORDER — CARVEDILOL 25 MG PO TABS
25.0000 mg | ORAL_TABLET | Freq: Two times a day (BID) | ORAL | Status: DC
  Administered 2023-07-08 – 2023-07-15 (×15): 25 mg via ORAL
  Filled 2023-07-08 (×15): qty 1

## 2023-07-08 MED ORDER — IRBESARTAN 75 MG PO TABS
75.0000 mg | ORAL_TABLET | Freq: Every day | ORAL | Status: DC
  Administered 2023-07-08 – 2023-07-15 (×8): 75 mg via ORAL
  Filled 2023-07-08 (×8): qty 1

## 2023-07-08 MED ORDER — INSULIN ASPART PROT & ASPART (70-30 MIX) 100 UNIT/ML ~~LOC~~ SUSP: 15.0000 [IU] | Freq: Every day | SUBCUTANEOUS | Status: DC

## 2023-07-08 MED ORDER — METHOCARBAMOL 500 MG PO TABS: 500.0000 mg | ORAL_TABLET | ORAL | Status: DC | PRN

## 2023-07-08 MED ORDER — SACCHAROMYCES BOULARDII 250 MG PO CAPS
250.0000 mg | ORAL_CAPSULE | Freq: Two times a day (BID) | ORAL | Status: DC
  Administered 2023-07-08 – 2023-07-15 (×15): 250 mg via ORAL
  Filled 2023-07-08 (×15): qty 1

## 2023-07-08 NOTE — Progress Notes (Signed)
   07/08/23 2304  BiPAP/CPAP/SIPAP  BiPAP/CPAP/SIPAP Pt Type Adult  BiPAP/CPAP/SIPAP DREAMSTATIOND  Mask Type Nasal mask  Mask Size Medium  Respiratory Rate 16 breaths/min  FiO2 (%) 21 %  Patient Home Equipment No  Auto Titrate Yes (automode, min4cm, max20cm h2o)  CPAP/SIPAP surface wiped down Yes  BiPAP/CPAP /SiPAP Vitals  Pulse Rate 81  Resp 16  SpO2 94 %

## 2023-07-08 NOTE — Plan of Care (Signed)
  Problem: Coping: Goal: Ability to adjust to condition or change in health will improve Outcome: Progressing   Problem: Fluid Volume: Goal: Ability to maintain a balanced intake and output will improve Outcome: Progressing   Problem: Health Behavior/Discharge Planning: Goal: Ability to identify and utilize available resources and services will improve Outcome: Progressing   

## 2023-07-08 NOTE — Progress Notes (Signed)
Pt had  bright red blood in her stool x 3 this morning. Send message to on-call NP. Will continue to monitor.

## 2023-07-08 NOTE — Consult Note (Addendum)
Consultation Note   Referring Provider:  [facility] PCP: [name], DO Primary Gastroenterologist: Dr. Doristine Locks        Reason for Consultation: rectal bleeding  DOA: 07/07/2023         Hospital Day: 2   ASSESSMENT & PLAN   62 y.o. year old female with PMH of pan diverticulosis, recurrent diverticular bleeding, anemia, adenomatous colon polyps, Diabetes Type II, Cushing's s/p adrenalectomy,and hypertension.    Painless rectal bleeding with history of pan diverticulosis,recurrent diverticular bleeding, and internal hemorrhoids.  CTA showed evidence of extensive diverticulosis without diverticulitis or active GI bleeding. -NPO -Patient started on Pantoprazole 40mg  IV Q12hrs will continue for now given reported episode of black stool this am though doubt this is upper GI bleed. -Monitor CBC -Consider dose of IV Iron this admission as she has hx of IDA and recurrent GI bleeding. -A repeat CTA if actively bleeding.  Acute anemia from blood loss. Hgb 13.2>11.0.  -as above  Hypertension -On oral medications  DM type II -On sliding scale insulin and Ozempic Qweekly  Additional co-morbidities include  : Cushing's disease s/p adrenalectomy   HISTORY OF PRESENT ILLNESS   Patient reports on Monday morning about 45 minutes after eating breakfast she went to have a bowel movement and noticed blood in the stool. About an hour later she had another bloody bowel movement and became concerned so contacted our office. She went on bowel rest Tuesday and limited intake which initially she states she started to feel better. Then on Wednesday she had regular breakfast and the generalized abdominal discomfort started with two dark bloody stools. She spoke with my colleague Gunnar Fusi who instructed her to come to ER.  Patient reports she had one episode of non bloody vomiting yesterday and RN gave her Zofran and Maalox which resolved the  nausea. She does admit to heartburn and regurgitation the last 24 hours, but states the Protonix has helped. No fever or chills. She does have generalized abdominal discomfort pain scale 5/10.  Patient states her last bloody bowel movement was this morning approximately 630am and was dark red in color. She has had three dark red bloody bowel movements with small amount of stool since admission.   She does report back in March she had a partial hysterectomy and since then she has had intermittent issues with constipation followed by bouts of loose stools.  Patient admits she feels fatigued and lack of energy. Not currently on iron supplementation.   Previous GI Evaluations   CTA 8/22 IMPRESSION: 1. No evidence of active GI bleed. 2. Extensive colonic diverticulosis without acute diverticulitis. 3.  Aortic Atherosclerosis (ICD10-I70.0).  1/22 Endoscopy Impression:  - Ectopic gastric mucosa in the upper third of the esophagus. - Normal esophagus.  - A few gastric polyps.  - Normal incisura, antrum and pylorus.  - Normal duodenal bulb, first portion of the duodenum and second portion of the duodenum.  - Successful completion of the Video Capsule Enteroscope placement.  - No specimens collected.   Small capsule endoscopy Small bowel endoscopy revealed small area of focal erythema versus subtle nonbleeding erosion in distal small bowel.  This appears to be an incidental finding and not likely to be of clinical, sequence and  unlikely to be the source of recurrent bleeding.  Otherwise completely normal exam.  No blood noted throughout small bowel lumen and in the visualized colon.  Based on these findings and recent extensive evaluation favor recurrent diverticular bleed.  1/22 Colonoscopy Impression:  - One 9 mm polyp at the appendiceal orifice, removed with a cold snare. Resected and retrieved.  - One 6 mm polyp in the transverse colon, removed with a cold snare. Resected and retrieved.   - Internal hemorrhoids.  - Moderate diverticulosis in the entire examined colon. There was evidence of an impacted diverticulum. There was no evidence of diverticular bleeding. No blood in the colon.  - The exam was otherwise normal on direct and retroflexion views.   Labs and Imaging: Recent Labs    07/07/23 1520 07/08/23 0706  WBC 11.8*  --   HGB 13.2 11.0*  HCT 43.4 36.2  PLT 360  --    Recent Labs    07/07/23 1520 07/08/23 0101  NA 136 137  K 3.7 3.6  CL 99 101  CO2 25 26  GLUCOSE 117* 104*  BUN 21 20  CREATININE 0.84 0.85  CALCIUM 9.9 9.3   Recent Labs    07/07/23 1520  PROT 8.0  ALBUMIN 4.0  AST 16  ALT 15  ALKPHOS 54  BILITOT 0.5   No results for input(s): "HEPBSAG", "HCVAB", "HEPAIGM", "HEPBIGM" in the last 72 hours. Recent Labs    07/08/23 0101  LABPROT 13.9  INR 1.1      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   Diabetes mellitus without complication (HCC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   Diverticulitis    Diverticulosis    GI bleed    Hyperlipidemia    Hypertension    Polycystic ovary disease    Sepsis (HCC) 05/2019    Past Surgical History:  Procedure Laterality Date   ADRENALECTOMY  1996   BREAST BIOPSY Right 2018   BREAST BIOPSY Left    x2   CHOLECYSTECTOMY     COLONOSCOPY WITH PROPOFOL N/A 10/03/2019   Procedure: COLONOSCOPY WITH PROPOFOL;  Surgeon: Meryl Dare, MD;  Location: [facility];  Service: Endoscopy;  Laterality: N/A;   COLONOSCOPY WITH PROPOFOL N/A 11/22/2020   Procedure: COLONOSCOPY WITH PROPOFOL;  Surgeon: Meryl Dare, MD;  Location: [facility];  Service: Endoscopy;  Laterality: N/A;   ESOPHAGOGASTRODUODENOSCOPY (EGD) WITH PROPOFOL N/A 07/16/2015   Procedure: ESOPHAGOGASTRODUODENOSCOPY (EGD) WITH PROPOFOL;  Surgeon: Rachael Fee, MD;  Location: [facility];  Service: Endoscopy;  Laterality: N/A;   ESOPHAGOGASTRODUODENOSCOPY (EGD) WITH PROPOFOL N/A  11/28/2020   Procedure: ESOPHAGOGASTRODUODENOSCOPY (EGD) WITH PROPOFOL;  Surgeon: Shellia Cleverly, DO;  Location: [facility];  Service: Gastroenterology;  Laterality: N/A;   GIVENS CAPSULE STUDY N/A 11/28/2020   Procedure: GIVENS CAPSULE STUDY;  Surgeon: Shellia Cleverly, DO;  Location: [facility];  Service: Gastroenterology;  Laterality: N/A;   POLYPECTOMY  10/03/2019   Procedure: POLYPECTOMY;  Surgeon: Meryl Dare, MD;  Location: [facility];  Service: Endoscopy;;   POLYPECTOMY  11/22/2020   Procedure: POLYPECTOMY;  Surgeon: Meryl Dare, MD;  Location: [facility];  Service: Endoscopy;;    Family History  Problem Relation Age of Onset   Hypertension Mother    Cancer Mother        ?uterine or ovarian cancer   Hypertension Father    Diabetes Father    Hypertension Sister  Diabetes Sister    Hypertension Brother    Diabetes Brother    Hypertension Maternal Grandfather    Diabetes Paternal Grandmother    Breast cancer Maternal Grandmother    Colon cancer Neg Hx     Prior to Admission medications   Medication Sig Start Date End Date Taking? Authorizing Provider  acetaminophen (TYLENOL) 325 MG tablet Take 2 tablets (650 mg total) by mouth every 6 (six) hours as needed for mild pain (or Fever >/= 101). Patient taking differently: Take 650 mg by mouth as needed for mild pain (or Fever >/= 101). 09/19/19  Yes Berton Mount I, MD  atorvastatin (LIPITOR) 10 MG tablet Take 10 mg by mouth daily. 03/06/19  Yes [provider]  carvedilol (COREG) 25 MG tablet Take 25 mg by mouth 2 (two) times daily. 10/30/20  Yes [provider]  empagliflozin (JARDIANCE) 10 MG TABS tablet Take 10 mg by mouth daily.   Yes [provider]  insulin aspart protamine - aspart (NOVOLOG MIX 70/30 FLEXPEN) (70-30) 100 UNIT/ML FlexPen Inject 10-30 Units into the skin See admin instructions. 10 units in the morning, and 30 at bedtime   Yes [provider]   metformin (FORTAMET) 500 MG (OSM) 24 hr tablet Take 500 mg by mouth 2 (two) times daily with a meal.   Yes [provider]  methocarbamol (ROBAXIN) 500 MG tablet Take 500 mg by mouth as needed for muscle spasms.   Yes [provider]  Multiple Vitamins-Minerals (MULTIVITAMIN WITH MINERALS) tablet Take 1 tablet by mouth daily.   Yes [provider]  olmesartan (BENICAR) 40 MG tablet Take 40 mg by mouth daily. 10/11/19  Yes [provider]  Semaglutide,0.25 or 0.5MG /DOS, (OZEMPIC, 0.25 OR 0.5 MG/DOSE,) 2 MG/1.5ML SOPN Inject 2 mg into the skin once a week.   Yes [provider]    Current Facility-Administered Medications  Medication Dose Route Frequency Provider Last Rate Last Admin   0.9 % NaCl with KCl 20 mEq/ L  infusion   Intravenous Continuous Gery Pray, MD 125 mL/hr at 07/08/23 0619 New Bag at 07/08/23 1610   acetaminophen (TYLENOL) tablet 650 mg  650 mg Oral Q6H PRN Gery Pray, MD   650 mg at 07/08/23 0524   Or   acetaminophen (TYLENOL) suppository 650 mg  650 mg Rectal Q6H PRN Crosley, Debby, MD       carvedilol (COREG) tablet 25 mg  25 mg Oral BID Joneen Roach, Debby, MD   25 mg at 07/08/23 0832   insulin aspart (novoLOG) injection 0-15 Units  0-15 Units Subcutaneous Q4H Crosley, Debby, MD   2 Units at 07/08/23 0619   insulin aspart protamine- aspart (NOVOLOG MIX 70/30) injection 15 Units  15 Units Subcutaneous Q supper Crosley, Debby, MD       insulin aspart protamine- aspart (NOVOLOG MIX 70/30) injection 5 Units  5 Units Subcutaneous Q breakfast Crosley, Debby, MD       irbesartan (AVAPRO) tablet 75 mg  75 mg Oral Daily Crosley, Debby, MD   75 mg at 07/08/23 0832   ondansetron (ZOFRAN) tablet 4 mg  4 mg Oral Q6H PRN Crosley, Debby, MD       Or   ondansetron (ZOFRAN) injection 4 mg  4 mg Intravenous Q6H PRN Crosley, Debby, MD       pantoprazole (PROTONIX) injection 40 mg  40 mg Intravenous Q12H Crosley, Debby, MD   40 mg at 07/08/23  9604   saccharomyces boulardii (FLORASTOR) capsule 250 mg  250 mg Oral BID Gillis Santa, MD   250 mg at 07/08/23 1610    Allergies as of 07/07/2023 - Review Complete 07/07/2023  Allergen Reaction Noted   Norvasc [amlodipine besylate] Swelling 02/22/2012   Nsaids Other (See Comments) 08/07/2015   Sulfa antibiotics Other (See Comments) 02/22/2012    Social History   Socioeconomic History   Marital status: Single    Spouse name: N/A   Number of children: 0   Years of education: Not on file   Highest education level: Not on file  Occupational History   Occupation: NURSE    Comment: [employer]  Tobacco Use   Smoking status: Never   Smokeless tobacco: Never  Vaping Use   Vaping status: Never Used  Substance and Sexual Activity   Alcohol use: No   Drug use: No   Sexual activity: Not Currently    Birth control/protection: Post-menopausal  Other Topics Concern   Not on file  Social History Narrative   Lives alone. Parents live in [place].   Social Determinants of Health   Financial Resource Strain: Not on file  Food Insecurity: No Food Insecurity (07/07/2023)   Hunger Vital Sign    Worried About Running Out of Food in the Last Year: Never true    Ran Out of Food in the Last Year: Never true  Transportation Needs: No Transportation Needs (07/07/2023)   PRAPARE - Administrator, Civil Service (Medical): No    Lack of Transportation (Non-Medical): No  Physical Activity: Not on file  Stress: Not on file  Social Connections: Not on file  Intimate Partner Violence: Not At Risk (07/07/2023)   Humiliation, Afraid, Rape, and Kick questionnaire    Fear of Current or Ex-Partner: No    Emotionally Abused: No    Physically Abused: No    Sexually Abused: No     Code Status   Code Status: Full Code  Review of Systems: All systems reviewed and negative except where noted in HPI.  Physical Exam: Vital signs in last 24 hours: Temp:  [98.3 F (36.8 C)-98.8 F (37.1  C)] 98.7 F (37.1 C) (08/22 0607) Pulse Rate:  [60-93] 91 (08/22 0607) Resp:  [16-18] 16 (08/22 0607) BP: (108-143)/(74-95) 121/95 (08/22 0607) SpO2:  [95 %-100 %] 97 % (08/22 0607) FiO2 (%):  [21 %] 21 % (08/21 2325) Weight:  [105.7 kg] 105.7 kg (08/21 1508) Last BM Date : 07/07/23  General:  Pleasant female in NAD Psych:  Cooperative. Normal mood and affect Eyes: Pupils equal Ears:  Normal auditory acuity Nose: No deformity, discharge or lesions Neck:  Supple, no masses felt Lungs:  Clear to auscultation.  Heart:  Regular rate, regular rhythm.  Abdomen:  Soft, nondistended, generalized tenderness, hypoactive bowel sounds, no masses felt Rectal :  Deferred Msk: Symmetrical without gross deformities.  Neurologic:  Alert, oriented, grossly normal neurologically Extremities : No edema Skin:  Intact without significant lesions.    Intake/Output from previous day: 08/21 0701 - 08/22 0700 In: 1347.6 [I.V.:847.6; IV Piggyback:500] Out: -  Intake/Output this shift:  No intake/output data recorded.  Principal Problem:   Diverticulosis of intestine with bleeding Active Problems:   Diabetes mellitus (HCC)   Essential hypertension   Hyperlipidemia with target LDL less than 100   Pancolonic diverticulosis   Internal hemorrhoids    Deanna May, NP-C @  07/08/2023, 8:48 AM  I have taken an interval history, thoroughly reviewed the chart and examined the patient. I agree with the Advanced  Practitioner's note, impression and recommendations, and have recorded additional findings, impressions and recommendations below. I performed a substantive portion of this encounter (>50% time spent), including a complete performance of the medical decision making.  My additional thoughts are as follows:  Recurrent diverticular bleed, drop from the initial hemoglobin but not near transfusion range at this point.  Similar episode in 2022 responded to conservative management.  Serial CBCs, IV  fluids, n.p.o. except ice chips and water for now.  If she has recurrent large-volume hematochezia, please get a stat CBC and a repeat CT angiogram open to catch the bleeding such that might allow angiographic control.  She looks well at the moment, no bleeding since the CTA (which was negative).  Daughter at the bedside, all questions answered.   Charlie Pitter III Office:[phone]

## 2023-07-08 NOTE — Progress Notes (Signed)
[facility] Progress Note  Patient: Carol Mueller    EXB:[id number]  DOA: 07/07/2023     Date of Service: the patient was seen and examined on 07/08/2023  Chief Complaint  Patient presents with   Melena   Brief hospital course: This is a 62 year old female with past medical history of pandiverticulosis, recurrent diverticular bleeding, T2DM, HTN.  She presents with complaints of blood in her stool and abdominal pain, first noted on Monday.  Following breakfast she had diarrheal episodes BRBPR.  Total of 3 during a course of the day.  She touch bases with her GI, went on bowel rest, not eating until she had a light supper Tuesday afternoon.  She woke this a.m. with dark red burgundy bloody diarrhea.  She touch base with her GI who recommended she go to the ER [note in chart].  Patient's last GI bleed episode was 2 years ago on the 21st, today makes exactly 2 years.  Patient endorses LUQ pain which she describes as burning and gassy.  She took Tums, then Mylanta and had some relief.  Per patient's her GERD has been getting worse over this past year.  She watches what she eats, has had to cut back on coffee and has cut out chocolate.  She does not eat spicy food, she is not a scheduled medication for GERD. She denies history of stomach ulcers.  Patient is not on blood thinners.  She endorses mild lightheadedness on Monday, none since   In the ER patient is hemodynamically stable.  Hemoglobin 13.2.  Per patient her hemoglobin was 14.1 during to physical a few weeks prior.  Patient has occult stool positive, burgundy. Last endoscopy is January 2022 pancolitis, internal hemorrhoids, 2 polyps removed with cold snare. 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  A few sessile polyps with no bleeding or stigmata of recent bleeding in the gastric fundus and body.  Capsule study done.   Assessment and Plan:  # Recurrent diverticular bleeding in patient with pan diverticulosis  Pt has h/o internal  hemorrhoids and adenomatous polyps -N.p.o., IV fluid hydration -Serial H&H every 8 hours.  -IV Protonix twice daily ordered given darkened stool, progressively worsening GERD, epigastric tenderness. -[facility] on-call consulted -CT angio abdomen and pelvis without evidence of active GI bleed.  Extensive colonic diverticulosis without acute diverticulitis noted HPI GI keep n.p.o., if recurrent bleeding then patient may need CTA and embolization by IR   # Essential hypertension -Coreg and Benicar resumed.   Monitor BP and titrate medications accordingly  # T2DM Sliding scale insulin, 70/30 resumed at 50% dose.  NPO. Ozempic Qsunday   # Cushing's disease, h/o R adrenalectomy   Body mass index is 41.27 kg/m.  Interventions:  Diet: NPO DVT Prophylaxis: SCD, pharmacological prophylaxis contraindicated due to GI bleed    Advance goals of care discussion: Full code  Family Communication: family was present at bedside, at the time of interview.  The pt provided permission to discuss medical plan with the family. Opportunity was given to ask question and all questions were answered satisfactorily.   Disposition:  Pt is from Home, admitted with Lower GI bleeding, still has risk of bleeding, which precludes a safe discharge. Discharge to Home, when stable and cleared by GI.  Subjective: No significant events overnight, patient had bright red blood in the stool 3 times in the morning.  Mild left lower quadrant pain, denied any nausea or vomiting.  No chest pain or palpitation, no shortness of breath.  Physical Exam: General: NAD, lying comfortably Appear in no distress, affect appropriate Eyes: PERRLA ENT: Oral Mucosa Clear, moist  Neck: no JVD,  Cardiovascular: S1 and S2 Present, no Murmur,  Respiratory: good respiratory effort, Bilateral Air entry equal and Decreased, no Crackles, no wheezes Abdomen: Bowel Sound present, Soft and mild LLQ tenderness,  Skin: no rashes Extremities:  no Pedal edema, no calf tenderness Neurologic: without any new focal findings Gait not checked due to patient safety concerns  Vitals:   07/08/23 0203 07/08/23 0607 07/08/23 0959 07/08/23 1333  BP: 133/74 (!) 121/95 125/70 127/76  Pulse: 76 91 79 83  Resp: 16 16 18 18   Temp: 98.8 F (37.1 C) 98.7 F (37.1 C) 98.6 F (37 C) 98.9 F (37.2 C)  TempSrc: Oral Oral Oral Oral  SpO2: 96% 97% 94% 98%  Weight:      Height:        Intake/Output Summary (Last 24 hours) at 07/08/2023 1503 Last data filed at 07/08/2023 1400 Gross per 24 hour  Intake 2317.47 ml  Output 0 ml  Net 2317.47 ml   Filed Weights   07/07/23 1508  Weight: 105.7 kg    Data Reviewed: I have personally reviewed and interpreted daily labs, tele strips, imagings as discussed above. I reviewed all nursing notes, pharmacy notes, vitals, pertinent old records I have discussed plan of care as described above with RN and patient/family.  CBC: Recent Labs  Lab 07/07/23 1520 07/08/23 0706 07/08/23 1343  WBC 11.8*  --   --   HGB 13.2 11.0* 10.3*  HCT 43.4 36.2 34.8*  MCV 71.7*  --   --   PLT 360  --   --    Basic Metabolic Panel: Recent Labs  Lab 07/07/23 1520 07/08/23 0101  NA 136 137  K 3.7 3.6  CL 99 101  CO2 25 26  GLUCOSE 117* 104*  BUN 21 20  CREATININE 0.84 0.85  CALCIUM 9.9 9.3    Studies: CT Angio Abd/Pel W and/or Wo Contrast  Result Date: 07/07/2023 CLINICAL DATA:  History of GI bleed with several days of dark stools EXAM: CTA ABDOMEN AND PELVIS WITHOUT AND WITH CONTRAST TECHNIQUE: Multidetector CT imaging of the abdomen and pelvis was performed using the standard protocol during bolus administration of intravenous contrast. Multiplanar reconstructed images and MIPs were obtained and reviewed to evaluate the vascular anatomy. RADIATION DOSE REDUCTION: This exam was performed according to the departmental dose-optimization program which includes automated exposure control, adjustment of the mA  and/or kV according to patient size and/or use of iterative reconstruction technique. CONTRAST:  OMNIPAQUE IOHEXOL 350 MG/ML SOLN COMPARISON:  CTA abdomen and pelvis dated 07/08/2021 FINDINGS: VASCULAR Aorta: Aortic atherosclerosis. Normal caliber aorta without aneurysm, dissection, vasculitis or significant stenosis. Celiac: Patent without evidence of aneurysm, dissection, vasculitis or significant stenosis. SMA: Patent without evidence of aneurysm, dissection, vasculitis or significant stenosis. Renals: Both renal arteries are patent without evidence of aneurysm, dissection, vasculitis, fibromuscular dysplasia or significant stenosis. IMA: Patent without evidence of aneurysm, dissection, vasculitis or significant stenosis. Inflow: Patent without evidence of aneurysm, dissection, vasculitis or significant stenosis. Proximal Outflow: Bilateral common femoral and visualized portions of the superficial and profunda femoral arteries are patent without evidence of aneurysm, dissection, vasculitis or significant stenosis. Veins: No obvious venous abnormality within the limitations of this arterial phase study. Review of the MIP images confirms the above findings. NON-VASCULAR Lower chest: No focal consolidation or pulmonary nodule in the lung bases. No pleural effusion or  pneumothorax demonstrated. Partially imaged heart size is normal. Hepatobiliary: No focal hepatic lesions. No intra or extrahepatic biliary ductal dilation. Cholecystectomy. Pancreas: No focal lesions or main ductal dilation. Spleen: Normal in size without focal abnormality. 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 No left adrenal nodules. No hydronephrosis or calculi. Bilateral simple cysts. Additional hypoattenuating foci do not demonstrate appreciable enhancement, likely hemorrhagic/proteinaceous cyst. No specific follow-up imaging recommended. No focal bladder wall thickening. Stomach/Bowel: Normal appearance of  the stomach. No evidence of bowel wall thickening, distention, or inflammatory changes. Extensive colonic diverticulosis without acute diverticulitis. Normal appendix. Lymphatic: No enlarged abdominal or pelvic lymph nodes. Reproductive: Multifocal exophytic masses arising from the uterus with coarse heterogeneous calcifications, likely leiomyomas. No adnexal masses. Other: No free fluid, fluid collection, or free air. Musculoskeletal: No acute or abnormal lytic or blastic osseous lesions. Multilevel degenerative changes of the partially imaged thoracic and lumbar spine. Small fat-containing right inguinal hernia. IMPRESSION: 1. No evidence of active GI bleed. 2. Extensive colonic diverticulosis without acute diverticulitis. 3.  Aortic Atherosclerosis (ICD10-I70.0). Electronically Signed   By: Agustin Cree M.D.   On: 07/07/2023 18:34    Scheduled Meds:  carvedilol  25 mg Oral BID   insulin aspart  0-15 Units Subcutaneous Q4H   insulin aspart protamine- aspart  15 Units Subcutaneous Q supper   insulin aspart protamine- aspart  5 Units Subcutaneous Q breakfast   irbesartan  75 mg Oral Daily   pantoprazole (PROTONIX) IV  40 mg Intravenous Q12H   saccharomyces boulardii  250 mg Oral BID   Continuous Infusions:  0.9 % NaCl with KCl 20 mEq / L 125 mL/hr at 07/08/23 1253   PRN Meds: acetaminophen **OR** acetaminophen, methocarbamol, morphine injection, ondansetron **OR** ondansetron (ZOFRAN) IV, oxyCODONE  Time spent: 35 minutes  Author: Gillis Santa. MD [facility] 07/08/2023 3:03 PM  To reach On-call, see care teams to locate the attending and reach out to them via [email or web address]. If 7PM-7AM, please contact night-coverage If you still have difficulty reaching the attending provider, please page the [facility] (Director on Call) for [facility] on amion for assistance.

## 2023-07-09 ENCOUNTER — Observation Stay (HOSPITAL_COMMUNITY): Payer: No Typology Code available for payment source

## 2023-07-09 DIAGNOSIS — K219 Gastro-esophageal reflux disease without esophagitis: Secondary | ICD-10-CM | POA: Diagnosis present

## 2023-07-09 DIAGNOSIS — Z7984 Long term (current) use of oral hypoglycemic drugs: Secondary | ICD-10-CM | POA: Diagnosis not present

## 2023-07-09 DIAGNOSIS — Z90711 Acquired absence of uterus with remaining cervical stump: Secondary | ICD-10-CM | POA: Diagnosis not present

## 2023-07-09 DIAGNOSIS — Z8619 Personal history of other infectious and parasitic diseases: Secondary | ICD-10-CM | POA: Diagnosis not present

## 2023-07-09 DIAGNOSIS — G8929 Other chronic pain: Secondary | ICD-10-CM | POA: Diagnosis present

## 2023-07-09 DIAGNOSIS — Z833 Family history of diabetes mellitus: Secondary | ICD-10-CM | POA: Diagnosis not present

## 2023-07-09 DIAGNOSIS — K648 Other hemorrhoids: Secondary | ICD-10-CM | POA: Diagnosis present

## 2023-07-09 DIAGNOSIS — K921 Melena: Secondary | ICD-10-CM | POA: Diagnosis present

## 2023-07-09 DIAGNOSIS — Z8719 Personal history of other diseases of the digestive system: Secondary | ICD-10-CM | POA: Diagnosis not present

## 2023-07-09 DIAGNOSIS — E249 Cushing's syndrome, unspecified: Secondary | ICD-10-CM | POA: Diagnosis present

## 2023-07-09 DIAGNOSIS — K5791 Diverticulosis of intestine, part unspecified, without perforation or abscess with bleeding: Secondary | ICD-10-CM

## 2023-07-09 DIAGNOSIS — E785 Hyperlipidemia, unspecified: Secondary | ICD-10-CM | POA: Diagnosis present

## 2023-07-09 DIAGNOSIS — K5731 Diverticulosis of large intestine without perforation or abscess with bleeding: Secondary | ICD-10-CM | POA: Diagnosis present

## 2023-07-09 DIAGNOSIS — D62 Acute posthemorrhagic anemia: Secondary | ICD-10-CM | POA: Diagnosis present

## 2023-07-09 DIAGNOSIS — Z794 Long term (current) use of insulin: Secondary | ICD-10-CM | POA: Diagnosis not present

## 2023-07-09 DIAGNOSIS — H0520 Unspecified exophthalmos: Secondary | ICD-10-CM | POA: Diagnosis present

## 2023-07-09 DIAGNOSIS — Z6841 Body Mass Index (BMI) 40.0 and over, adult: Secondary | ICD-10-CM | POA: Diagnosis not present

## 2023-07-09 DIAGNOSIS — E119 Type 2 diabetes mellitus without complications: Secondary | ICD-10-CM | POA: Diagnosis present

## 2023-07-09 DIAGNOSIS — Z86010 Personal history of colonic polyps: Secondary | ICD-10-CM | POA: Diagnosis not present

## 2023-07-09 DIAGNOSIS — Z8249 Family history of ischemic heart disease and other diseases of the circulatory system: Secondary | ICD-10-CM | POA: Diagnosis not present

## 2023-07-09 DIAGNOSIS — K5900 Constipation, unspecified: Secondary | ICD-10-CM | POA: Diagnosis present

## 2023-07-09 DIAGNOSIS — D497 Neoplasm of unspecified behavior of endocrine glands and other parts of nervous system: Secondary | ICD-10-CM | POA: Diagnosis present

## 2023-07-09 DIAGNOSIS — Z9049 Acquired absence of other specified parts of digestive tract: Secondary | ICD-10-CM | POA: Diagnosis not present

## 2023-07-09 DIAGNOSIS — I10 Essential (primary) hypertension: Secondary | ICD-10-CM | POA: Diagnosis present

## 2023-07-09 DIAGNOSIS — D72829 Elevated white blood cell count, unspecified: Secondary | ICD-10-CM | POA: Diagnosis present

## 2023-07-09 LAB — CBC
HCT: 30 % — ABNORMAL LOW (ref 36.0–46.0)
HCT: 28.5 % — ABNORMAL LOW (ref 36.0–46.0)
Hemoglobin: 9 g/dL — ABNORMAL LOW (ref 12.0–15.0)
Hemoglobin: 8.7 g/dL — ABNORMAL LOW (ref 12.0–15.0)
MCH: 21.4 pg — ABNORMAL LOW (ref 26.0–34.0)
MCH: 22.4 pg — ABNORMAL LOW (ref 26.0–34.0)
MCHC: 30 g/dL (ref 30.0–36.0)
MCHC: 30.5 g/dL (ref 30.0–36.0)
MCV: 71.4 fL — ABNORMAL LOW (ref 80.0–100.0)
MCV: 73.3 fL — ABNORMAL LOW (ref 80.0–100.0)
Platelets: 288 10*3/uL (ref 150–400)
Platelets: 261 10*3/uL (ref 150–400)
RBC: 4.2 MIL/uL (ref 3.87–5.11)
RBC: 3.89 MIL/uL (ref 3.87–5.11)
RDW: 16.9 % — ABNORMAL HIGH (ref 11.5–15.5)
RDW: 17.2 % — ABNORMAL HIGH (ref 11.5–15.5)
WBC: 11.4 10*3/uL — ABNORMAL HIGH (ref 4.0–10.5)
WBC: 11.6 10*3/uL — ABNORMAL HIGH (ref 4.0–10.5)
nRBC: 0 % (ref 0.0–0.2)
nRBC: 0 % (ref 0.0–0.2)

## 2023-07-09 LAB — GLUCOSE, CAPILLARY
Glucose-Capillary: 104 mg/dL — ABNORMAL HIGH (ref 70–99)
Glucose-Capillary: 87 mg/dL (ref 70–99)
Glucose-Capillary: 95 mg/dL (ref 70–99)
Glucose-Capillary: 105 mg/dL — ABNORMAL HIGH (ref 70–99)
Glucose-Capillary: 108 mg/dL — ABNORMAL HIGH (ref 70–99)
Glucose-Capillary: 97 mg/dL (ref 70–99)

## 2023-07-09 LAB — BASIC METABOLIC PANEL
Anion gap: 7 (ref 5–15)
BUN: 18 mg/dL (ref 8–23)
CO2: 24 mmol/L (ref 22–32)
Calcium: 8.4 mg/dL — ABNORMAL LOW (ref 8.9–10.3)
Chloride: 108 mmol/L (ref 98–111)
Creatinine, Ser: 0.89 mg/dL (ref 0.44–1.00)
GFR, Estimated: >60 mL/min (ref >60)
Glucose, Bld: 93 mg/dL (ref 70–99)
Potassium: 4.3 mmol/L (ref 3.5–5.1)
Sodium: 139 mmol/L (ref 135–145)

## 2023-07-09 LAB — HEMOGLOBIN AND HEMATOCRIT, BLOOD
HCT: 29.9 % — ABNORMAL LOW (ref 36.0–46.0)
Hemoglobin: 9 g/dL — ABNORMAL LOW (ref 12.0–15.0)

## 2023-07-09 LAB — PHOSPHORUS: Phosphorus: 3 mg/dL (ref 2.5–4.6)

## 2023-07-09 LAB — MAGNESIUM: Magnesium: 1.8 mg/dL (ref 1.7–2.4)

## 2023-07-09 MED ORDER — SODIUM CHLORIDE (PF) 0.9 % IJ SOLN
INTRAMUSCULAR | Status: AC
  Filled 2023-07-09: qty 50

## 2023-07-09 MED ORDER — IOHEXOL 350 MG/ML SOLN
100.0000 mL | Freq: Once | INTRAVENOUS | Status: AC | PRN
  Administered 2023-07-09: 100 mL via INTRAVENOUS

## 2023-07-09 NOTE — Progress Notes (Addendum)
Transition of Care [facility]) - Inpatient Brief Assessment   Patient Details  Name: Carol Mueller MRN: [id number] Date of Birth: 08-Oct-1961  [facility]) CM/SW Contact:    Coralyn Helling, LCSW Phone Number: 07/09/2023, 10:33 AM   Clinical Narrative: No TOC needs identified at the time of screening.   Transition of Care Asessment: Insurance and Status: Insurance coverage has been reviewed Patient has primary care physician: Yes Home environment has been reviewed: Alone Prior level of function:: Independnet Prior/Current Home Services: No current home services Social Determinants of Health Reivew: SDOH reviewed no interventions necessary Readmission risk has been reviewed: Yes Transition of care needs: no transition of care needs at this time

## 2023-07-09 NOTE — Progress Notes (Signed)
[facility] Progress Note  Patient: Carol Mueller    ZOX:[id number]  DOA: 07/07/2023     Date of Service: the patient was seen and examined on 07/09/2023  Chief Complaint  Patient presents with   Melena   Brief hospital course: This is a 62 year old female with past medical history of pandiverticulosis, recurrent diverticular bleeding, T2DM, HTN.  She presents with complaints of blood in her stool and abdominal pain, first noted on Monday.  Following breakfast she had diarrheal episodes BRBPR.  Total of 3 during a course of the day.  She touch bases with her GI, went on bowel rest, not eating until she had a light supper Tuesday afternoon.  She woke this a.m. with dark red burgundy bloody diarrhea.  She touch base with her GI who recommended she go to the ER [note in chart].  Patient's last GI bleed episode was 2 years ago on the 21st, today makes exactly 2 years.  Patient endorses LUQ pain which she describes as burning and gassy.  She took Tums, then Mylanta and had some relief.  Per patient's her GERD has been getting worse over this past year.  She watches what she eats, has had to cut back on coffee and has cut out chocolate.  She does not eat spicy food, she is not a scheduled medication for GERD. She denies history of stomach ulcers.  Patient is not on blood thinners.  She endorses mild lightheadedness on Monday, none since   In the ER patient is hemodynamically stable.  Hemoglobin 13.2.  Per patient her hemoglobin was 14.1 during to physical a few weeks prior.  Patient has occult stool positive, burgundy. Last endoscopy is January 2022 pancolitis, internal hemorrhoids, 2 polyps removed with cold snare. 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  A few sessile polyps with no bleeding or stigmata of recent bleeding in the gastric fundus and body.  Capsule study done.  07/09/2023: Patient continues to have bright red blood per rectum.  GI team is directing care.  GI input is highly  appreciated.  Continue to monitor H/H.   Assessment and Plan:  # Recurrent diverticular bleeding in patient with pan diverticulosis  Pt has h/o internal hemorrhoids and adenomatous polyps -N.p.o., IV fluid hydration -Serial H&H every 8 hours.  -IV Protonix twice daily ordered given darkened stool, progressively worsening GERD, epigastric tenderness. -[facility] on-call consulted -CT angio abdomen and pelvis without evidence of active GI bleed.  Extensive colonic diverticulosis without acute diverticulitis noted HPI GI keep n.p.o., if recurrent bleeding then patient may need CTA and embolization by IR  07/09/2023: See above documentation.  # Essential hypertension -Coreg and Benicar resumed.   Monitor BP and titrate medications accordingly 07/09/2023: Blood pressure is controlled.  # T2DM -Sliding scale insulin only. -Hold 7030 for now.     # Cushing's disease, h/o R adrenalectomy   Body mass index is 41.27 kg/m.  Interventions:  Diet: NPO DVT Prophylaxis: SCD, pharmacological prophylaxis contraindicated due to GI bleed    Advance goals of care discussion: Full code  Family Communication: family was present at bedside, at the time of interview.  The pt provided permission to discuss medical plan with the family. Opportunity was given to ask question and all questions were answered satisfactorily.   Disposition:  Pt is from Home, admitted with Lower GI bleeding, still has risk of bleeding, which precludes a safe discharge. Discharge to Home, when stable and cleared by GI.  Subjective: Bright red blood  per rectum.  Physical Exam: General: Not in any distress.  Awake and alert.  Exophthalmos is noted. HEENT: Patient is pale.  Exophthalmos is noted.  Neck: no JVD, supple. Cardiovascular: S1 and S2. Respiratory: Clear to auscultation. Abdomen: Obese, soft and nontender. Extremities: No leg edema.   Neurologic: Patient is awake and alert.  Patient moves all  extremities.  Vitals:   07/08/23 2143 07/08/23 2304 07/09/23 0558 07/09/23 1325  BP: 121/78  (!) 141/76 (!) 109/93  Pulse: 92 81 92 96  Resp: 18 16 18 18   Temp: 98.6 F (37 C)  98.4 F (36.9 C) 98.6 F (37 C)  TempSrc: Oral  Oral Oral  SpO2: 97% 94% 98% 95%  Weight:      Height:        Intake/Output Summary (Last 24 hours) at 07/09/2023 1704 Last data filed at 07/09/2023 1400 Gross per 24 hour  Intake 3138.89 ml  Output 0 ml  Net 3138.89 ml   Filed Weights   07/07/23 1508  Weight: 105.7 kg     CBC: Recent Labs  Lab 07/07/23 1520 07/08/23 0706 07/08/23 1343 07/08/23 2202 07/09/23 0420 07/09/23 0601 07/09/23 1203  WBC 11.8*  --   --   --  11.4*  --  11.6*  HGB 13.2   < > 10.3* 10.0* 9.0* 9.0* 8.7*  HCT 43.4   < > 34.8* 33.0* 30.0* 29.9* 28.5*  MCV 71.7*  --   --   --  71.4*  --  73.3*  PLT 360  --   --   --  288  --  261   < > = values in this interval not displayed.   Basic Metabolic Panel: Recent Labs  Lab 07/07/23 1520 07/08/23 0101 07/09/23 0420  NA 136 137 139  K 3.7 3.6 4.3  CL 99 101 108  CO2 25 26 24   GLUCOSE 117* 104* 93  BUN 21 20 18   CREATININE 0.84 0.85 0.89  CALCIUM 9.9 9.3 8.4*  MG  --   --  1.8  PHOS  --   --  3.0    Studies: CT ANGIO GI BLEED  Result Date: 07/09/2023 CLINICAL DATA:  Rectal bleeding and generalized abdominal pain for several days. EXAM: CTA ABDOMEN AND PELVIS WITHOUT AND WITH CONTRAST TECHNIQUE: Multidetector CT imaging of the abdomen and pelvis was performed using the standard protocol during bolus administration of intravenous contrast. Multiplanar reconstructed images and MIPs were obtained and reviewed to evaluate the vascular anatomy. RADIATION DOSE REDUCTION: This exam was performed according to the departmental dose-optimization program which includes automated exposure control, adjustment of the mA and/or kV according to patient size and/or use of iterative reconstruction technique. CONTRAST:  OMNIPAQUE  IOHEXOL 350 MG/ML SOLN COMPARISON:  July 07, 2023. FINDINGS: VASCULAR Aorta: Normal caliber aorta without aneurysm, dissection, vasculitis or significant stenosis. Celiac: Patent without evidence of aneurysm, dissection, vasculitis or significant stenosis. SMA: Patent without evidence of aneurysm, dissection, vasculitis or significant stenosis. Renals: Both renal arteries are patent without evidence of aneurysm, dissection, vasculitis, fibromuscular dysplasia or significant stenosis. IMA: Patent without evidence of aneurysm, dissection, vasculitis or significant stenosis. Inflow: Patent without evidence of aneurysm, dissection, vasculitis or significant stenosis. Proximal Outflow: Bilateral common femoral and visualized portions of the superficial and profunda femoral arteries are patent without evidence of aneurysm, dissection, vasculitis or significant stenosis. Veins: No obvious venous abnormality within the limitations of this arterial phase study. Review of the MIP images confirms the above findings. NON-VASCULAR Lower  chest: No acute abnormality. Hepatobiliary: No focal liver abnormality is seen. Status post cholecystectomy. No biliary dilatation. Pancreas: Unremarkable. No pancreatic ductal dilatation or surrounding inflammatory changes. Spleen: Normal in size without focal abnormality. Adrenals/Urinary Tract: Status post right adrenalectomy. Left adrenal gland is unremarkable. Stable bilateral renal cysts are noted for which no further follow-up is required. No hydronephrosis or renal obstruction is noted. Urinary bladder is unremarkable. Stomach/Bowel: The stomach is unremarkable. There is no evidence of bowel obstruction or inflammation. The appendix appears normal. Diverticulosis is noted throughout the colon. No definite evidence of gastrointestinal bleeding is noted. Lymphatic: No adenopathy is noted. Reproductive: Multiple calcified uterine fibroids are noted. No adnexal abnormality. Other: Small fat  containing periumbilical hernia. No abdominopelvic ascites. Musculoskeletal: No acute or significant osseous findings. IMPRESSION: VASCULAR No definite evidence of active gastrointestinal bleeding. NON-VASCULAR Diffuse colonic diverticulosis is noted without inflammation. Multiple calcified uterine fibroids are noted. Electronically Signed   By: Lupita Raider M.D.   On: 07/09/2023 13:58    Scheduled Meds:  carvedilol  25 mg Oral BID   insulin aspart  0-15 Units Subcutaneous Q4H   insulin aspart protamine- aspart  15 Units Subcutaneous Q supper   insulin aspart protamine- aspart  5 Units Subcutaneous Q breakfast   irbesartan  75 mg Oral Daily   pantoprazole (PROTONIX) IV  40 mg Intravenous Q12H   saccharomyces boulardii  250 mg Oral BID   Continuous Infusions:  0.9 % NaCl with KCl 20 mEq / L 125 mL/hr at 07/09/23 1115   PRN Meds: acetaminophen **OR** acetaminophen, methocarbamol, morphine injection, ondansetron **OR** ondansetron (ZOFRAN) IV, oxyCODONE  Time spent: 35 minutes  Author: Berton Mount, MD [facility] 07/09/2023 5:04 PM  To reach On-call, see care teams to locate the attending and reach out to them via [email or web address]. If 7PM-7AM, please contact night-coverage If you still have difficulty reaching the attending provider, please page the [facility] (Director on Call) for [facility] on amion for assistance.

## 2023-07-09 NOTE — Progress Notes (Addendum)
Daily Progress Note  DOA: 07/07/2023 Hospital Day: 3  Chief Complaint: rectal bleeding  ASSESSMENT & PLAN   Brief Narrative:  Carol Mueller is a 62 y.o. year old female with a history of recurrent GI bleeds ( presumed diverticular), DM2, HTN, Cushings disease  Hematochezia, ongoing. Bleeding overall painless, ome generalized abdominal discomfort . Has LUQ pain but this is more chronic. Suspect stuttering recurrent diverticular hemorrhage. CTA negative for bleeding on admission -Still passing dark red blood ( x 3 - 4 during the night) and two this am though less volume of blood on last BM. Also, had small BM just now and there was only a small amount of dark red blood so hopefully slowly down.   -Hgb down to 9 from 13 on admit ( and 14.1 on labs with PCP two weeks ago.). Has daily CBCs ordered but given on going bleeding will repeat later today.  --She will let RN know if there is bleeding increases in which case she may need repeat CTA  ADDENDUM: Chest contacted by patient's nurse.  Patient had 2 additional large bloody bowel movements within the last hour.  CT angio and CBC ordered  Anemia of acute blood loss - As above.   LUQ pain, chronic. No abnormalities of stomach, spleen or pancreas on CTA. Musculoskeletal ?  --Continue PPI --Can evaluate further as outpatient    [facility] Attending   I have taken an interval history, reviewed the chart and examined the patient. I agree with the Advanced Practitioner's note, impression and recommendations with the following additions:  She has had further hematochezia w/o major change in Hgb from [place]. Repeat CT angio did not reveal a source. BUN is NL - doubt UGI bleed  Still sounds like colonic diverticular hemorrhage that usually stops spontaneously.  Explained to her - continue current care and we will follow.  Iva Boop, MD, [facility] See Loretha Stapler on call - gastroenterology for best contact  person 07/09/2023 5:17 PM  Subjective   3-4 episodes of bloody stool last night and a couple this am. Last BM was slightly smaller amount of blood.    Objective   Previous GI studies:  1/22 Endoscopy Impression:  - Ectopic gastric mucosa in the upper third of the esophagus. - Normal esophagus.  - A few gastric polyps.  - Normal incisura, antrum and pylorus.  - Normal duodenal bulb, first portion of the duodenum and second portion of the duodenum.  - Successful completion of the Video Capsule Enteroscope placement.  - No specimens collected.     Small capsule endoscopy Small bowel endoscopy revealed small area of focal erythema versus subtle nonbleeding erosion in distal small bowel.  This appears to be an incidental finding and not likely to be of clinical, sequence and unlikely to be the source of recurrent bleeding.  Otherwise completely normal exam.  No blood noted throughout small bowel lumen and in the visualized colon.  Based on these findings and recent extensive evaluation favor recurrent diverticular bleed.   1/22 Colonoscopy Impression:  - One 9 mm polyp at the appendiceal orifice, removed with a cold snare. Resected and retrieved.  - One 6 mm polyp in the transverse colon, removed with a cold snare. Resected and retrieved.  - Internal hemorrhoids.  - Moderate diverticulosis in the entire examined colon. There was evidence of an impacted diverticulum. There was no evidence of diverticular bleeding. No blood in the colon.  - The exam was otherwise  normal on direct and retroflexion views.  Recent Labs    07/07/23 1520 07/08/23 0706 07/08/23 2202 07/09/23 0420 07/09/23 0601  WBC 11.8*  --   --  11.4*  --   HGB 13.2   < > 10.0* 9.0* 9.0*  HCT 43.4   < > 33.0* 30.0* 29.9*  PLT 360  --   --  288  --    < > = values in this interval not displayed.   BMET Recent Labs    07/07/23 1520 07/08/23 0101 07/09/23 0420  NA 136 137 139  K 3.7 3.6 4.3  CL 99 101 108   CO2 25 26 24   GLUCOSE 117* 104* 93  BUN 21 20 18   CREATININE 0.84 0.85 0.89  CALCIUM 9.9 9.3 8.4*   LFT Recent Labs    07/07/23 1520  PROT 8.0  ALBUMIN 4.0  AST 16  ALT 15  ALKPHOS 54  BILITOT 0.5   PT/INR Recent Labs    07/08/23 0101  LABPROT 13.9  INR 1.1     Imaging:  CT Angio Abd/Pel W and/or Wo Contrast CLINICAL DATA:  History of GI bleed with several days of dark stools  EXAM: CTA ABDOMEN AND PELVIS WITHOUT AND WITH CONTRAST  TECHNIQUE: Multidetector CT imaging of the abdomen and pelvis was performed using the standard protocol during bolus administration of intravenous contrast. Multiplanar reconstructed images and MIPs were obtained and reviewed to evaluate the vascular anatomy.  RADIATION DOSE REDUCTION: This exam was performed according to the departmental dose-optimization program which includes automated exposure control, adjustment of the mA and/or kV according to patient size and/or use of iterative reconstruction technique.  CONTRAST:  OMNIPAQUE IOHEXOL 350 MG/ML SOLN  COMPARISON:  CTA abdomen and pelvis dated 07/08/2021  FINDINGS: VASCULAR  Aorta: Aortic atherosclerosis. Normal caliber aorta without aneurysm, dissection, vasculitis or significant stenosis.  Celiac: Patent without evidence of aneurysm, dissection, vasculitis or significant stenosis.  SMA: Patent without evidence of aneurysm, dissection, vasculitis or significant stenosis.  Renals: Both renal arteries are patent without evidence of aneurysm, dissection, vasculitis, fibromuscular dysplasia or significant stenosis.  IMA: Patent without evidence of aneurysm, dissection, vasculitis or significant stenosis.  Inflow: Patent without evidence of aneurysm, dissection, vasculitis or significant stenosis.  Proximal Outflow: Bilateral common femoral and visualized portions of the superficial and profunda femoral arteries are patent without evidence of aneurysm,  dissection, vasculitis or significant stenosis.  Veins: No obvious venous abnormality within the limitations of this arterial phase study.  Review of the MIP images confirms the above findings.  NON-VASCULAR  Lower chest: No focal consolidation or pulmonary nodule in the lung bases. No pleural effusion or pneumothorax demonstrated. Partially imaged heart size is normal.  Hepatobiliary: No focal hepatic lesions. No intra or extrahepatic biliary ductal dilation. Cholecystectomy.  Pancreas: No focal lesions or main ductal dilation.  Spleen: Normal in size without focal abnormality.  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 No left adrenal nodules. No hydronephrosis or calculi. Bilateral simple cysts. Additional hypoattenuating foci do not demonstrate appreciable enhancement, likely hemorrhagic/proteinaceous cyst. No specific follow-up imaging recommended. No focal bladder wall thickening.  Stomach/Bowel: Normal appearance of the stomach. No evidence of bowel wall thickening, distention, or inflammatory changes. Extensive colonic diverticulosis without acute diverticulitis. Normal appendix.  Lymphatic: No enlarged abdominal or pelvic lymph nodes.  Reproductive: Multifocal exophytic masses arising from the uterus with coarse heterogeneous calcifications, likely leiomyomas. No adnexal masses.  Other: No free fluid, fluid collection, or free air.  Musculoskeletal:  No acute or abnormal lytic or blastic osseous lesions. Multilevel degenerative changes of the partially imaged thoracic and lumbar spine. Small fat-containing right inguinal hernia.  IMPRESSION: 1. No evidence of active GI bleed. 2. Extensive colonic diverticulosis without acute diverticulitis. 3.  Aortic Atherosclerosis (ICD10-I70.0).  Electronically Signed   By: Agustin Cree M.D.   On: 07/07/2023 18:34     Scheduled inpatient medications:   carvedilol  25 mg Oral BID   insulin  aspart  0-15 Units Subcutaneous Q4H   insulin aspart protamine- aspart  15 Units Subcutaneous Q supper   insulin aspart protamine- aspart  5 Units Subcutaneous Q breakfast   irbesartan  75 mg Oral Daily   pantoprazole (PROTONIX) IV  40 mg Intravenous Q12H   saccharomyces boulardii  250 mg Oral BID   Continuous inpatient infusions:   0.9 % NaCl with KCl 20 mEq / L 125 mL/hr at 07/09/23 0432   PRN inpatient medications: acetaminophen **OR** acetaminophen, methocarbamol, morphine injection, ondansetron **OR** ondansetron (ZOFRAN) IV, oxyCODONE  Vital signs in last 24 hours: Temp:  [98.4 F (36.9 C)-98.9 F (37.2 C)] 98.4 F (36.9 C) (08/23 0558) Pulse Rate:  [81-92] 92 (08/23 0558) Resp:  [16-18] 18 (08/23 0558) BP: (121-141)/(76-78) 141/76 (08/23 0558) SpO2:  [94 %-98 %] 98 % (08/23 0558) FiO2 (%):  [21 %] 21 % (08/22 2304) Last BM Date : 07/08/23  Intake/Output Summary (Last 24 hours) at 07/09/2023 1001 Last data filed at 07/09/2023 0620 Gross per 24 hour  Intake 2512.2 ml  Output 0 ml  Net 2512.2 ml    Intake/Output from previous day: 08/22 0701 - 08/23 0700 In: 3001.9 [P.O.:60; I.V.:2941.9] Out: 0  Intake/Output this shift: No intake/output data recorded.   Physical Exam:  General: Alert female in NAD Heart:  Regular rate and rhythm.  Pulmonary: Normal respiratory effort Abdomen: Soft, nondistended, nontender. Normal bowel sounds. Neurologic: Alert and oriented Psych: Pleasant. Cooperative. Insight appears normal.    Principal Problem:   Diverticulosis of intestine with bleeding Active Problems:   Diabetes mellitus (HCC)   Essential hypertension   Hyperlipidemia with target LDL less than 100   Pancolonic diverticulosis   Internal hemorrhoids     LOS: 0 days   Willette Cluster ,NP 07/09/2023, 10:01 AM

## 2023-07-09 NOTE — Plan of Care (Signed)
  Problem: Coping: Goal: Ability to adjust to condition or change in health will improve Outcome: Progressing   Problem: Fluid Volume: Goal: Ability to maintain a balanced intake and output will improve Outcome: Progressing   Problem: Metabolic: Goal: Ability to maintain appropriate glucose levels will improve Outcome: Progressing   Problem: Nutritional: Goal: Maintenance of adequate nutrition will improve Outcome: Progressing   Problem: Skin Integrity: Goal: Risk for impaired skin integrity will decrease Outcome: Progressing   Problem: Health Behavior/Discharge Planning: Goal: Ability to manage health-related needs will improve Outcome: Progressing

## 2023-07-09 NOTE — Progress Notes (Signed)
   07/09/23 2214  BiPAP/CPAP/SIPAP  BiPAP/CPAP/SIPAP Pt Type Adult  BiPAP/CPAP/SIPAP DREAMSTATIOND  Mask Type Nasal mask  Mask Size Medium  FiO2 (%) 21 %  Patient Home Equipment No  Auto Titrate Yes (4-20 cmH2O)

## 2023-07-10 DIAGNOSIS — K5731 Diverticulosis of large intestine without perforation or abscess with bleeding: Secondary | ICD-10-CM | POA: Diagnosis not present

## 2023-07-10 DIAGNOSIS — K5791 Diverticulosis of intestine, part unspecified, without perforation or abscess with bleeding: Secondary | ICD-10-CM | POA: Diagnosis not present

## 2023-07-10 DIAGNOSIS — D62 Acute posthemorrhagic anemia: Secondary | ICD-10-CM | POA: Diagnosis not present

## 2023-07-10 LAB — CBC
HCT: 22.2 % — ABNORMAL LOW (ref 36.0–46.0)
Hemoglobin: 6.7 g/dL — CL (ref 12.0–15.0)
MCH: 21.9 pg — ABNORMAL LOW (ref 26.0–34.0)
MCHC: 30.2 g/dL (ref 30.0–36.0)
MCV: 72.5 fL — ABNORMAL LOW (ref 80.0–100.0)
Platelets: 223 10*3/uL (ref 150–400)
RBC: 3.06 MIL/uL — ABNORMAL LOW (ref 3.87–5.11)
RDW: 17.1 % — ABNORMAL HIGH (ref 11.5–15.5)
WBC: 10.5 10*3/uL (ref 4.0–10.5)
nRBC: 0 % (ref 0.0–0.2)

## 2023-07-10 LAB — IRON AND TIBC
Iron: 35 ug/dL (ref 28–170)
Saturation Ratios: 13 % (ref 10.4–31.8)
TIBC: 267 ug/dL (ref 250–450)
UIBC: 232 ug/dL

## 2023-07-10 LAB — GLUCOSE, CAPILLARY
Glucose-Capillary: 83 mg/dL (ref 70–99)
Glucose-Capillary: 91 mg/dL (ref 70–99)
Glucose-Capillary: 86 mg/dL (ref 70–99)
Glucose-Capillary: 136 mg/dL — ABNORMAL HIGH (ref 70–99)
Glucose-Capillary: 97 mg/dL (ref 70–99)
Glucose-Capillary: 163 mg/dL — ABNORMAL HIGH (ref 70–99)
Glucose-Capillary: 98 mg/dL (ref 70–99)

## 2023-07-10 LAB — MAGNESIUM: Magnesium: 1.8 mg/dL (ref 1.7–2.4)

## 2023-07-10 LAB — BASIC METABOLIC PANEL
Anion gap: 6 (ref 5–15)
BUN: 14 mg/dL (ref 8–23)
CO2: 22 mmol/L (ref 22–32)
Calcium: 7.9 mg/dL — ABNORMAL LOW (ref 8.9–10.3)
Chloride: 110 mmol/L (ref 98–111)
Creatinine, Ser: 0.77 mg/dL (ref 0.44–1.00)
GFR, Estimated: >60 mL/min (ref >60)
Glucose, Bld: 89 mg/dL (ref 70–99)
Potassium: 4.3 mmol/L (ref 3.5–5.1)
Sodium: 138 mmol/L (ref 135–145)

## 2023-07-10 LAB — PREPARE RBC (CROSSMATCH)

## 2023-07-10 LAB — HEMOGLOBIN AND HEMATOCRIT, BLOOD
HCT: 23.6 % — ABNORMAL LOW (ref 36.0–46.0)
Hemoglobin: 7.4 g/dL — ABNORMAL LOW (ref 12.0–15.0)

## 2023-07-10 LAB — FERRITIN: Ferritin: 32 ng/mL (ref 11–307)

## 2023-07-10 LAB — PHOSPHORUS: Phosphorus: 2.4 mg/dL — ABNORMAL LOW (ref 2.5–4.6)

## 2023-07-10 MED ORDER — SODIUM CHLORIDE 0.9% IV SOLUTION: Freq: Once | INTRAVENOUS | Status: AC

## 2023-07-10 NOTE — Progress Notes (Signed)
Received critical lab result hemoglobin level 6.7. On call Marja Kays notified via secure chat.

## 2023-07-10 NOTE — Progress Notes (Signed)
Daily Progress Note  DOA: 07/07/2023 Hospital Day: 4  Chief Complaint: rectal bleeding  ASSESSMENT & PLAN   Brief Narrative:  Carol Mueller is a 62 y.o. year old female with a history of recurrent GI bleeds ( presumed diverticular), DM2, HTN, Cushings disease  Hematochezia, ongoing. Bleeding overall painless, ome generalized abdominal discomfort . Has LUQ pain but this is more chronic. Suspect stuttering recurrent diverticular hemorrhage. CTA negative for bleeding on admission and again 8/23  Had darker stool overnight and Hgb 6.7 - to get transfused Supportive care to continue Will follow and adjust care depending upon clinical course but diverticular hemorrhage usually stop on there own unless we are lucky enough to fine and treat the bleeding which is intermittent.  Anemia of acute blood loss    LUQ pain, chronic. No abnormalities of stomach, spleen or pancreas on CTA.  --Continue PPI     Subjective  Darker bloody stool overnight x 1 To be transfused    Objective   Previous GI studies:  1/22 Endoscopy Impression:  - Ectopic gastric mucosa in the upper third of the esophagus. - Normal esophagus.  - A few gastric polyps.  - Normal incisura, antrum and pylorus.  - Normal duodenal bulb, first portion of the duodenum and second portion of the duodenum.  - Successful completion of the Video Capsule Enteroscope placement.  - No specimens collected.     Small capsule endoscopy Small bowel endoscopy revealed small area of focal erythema versus subtle nonbleeding erosion in distal small bowel.  This appears to be an incidental finding and not likely to be of clinical, sequence and unlikely to be the source of recurrent bleeding.  Otherwise completely normal exam.  No blood noted throughout small bowel lumen and in the visualized colon.  Based on these findings and recent extensive evaluation favor recurrent diverticular bleed.   1/22 Colonoscopy Impression:  -  One 9 mm polyp at the appendiceal orifice, removed with a cold snare. Resected and retrieved.  - One 6 mm polyp in the transverse colon, removed with a cold snare. Resected and retrieved.  - Internal hemorrhoids.  - Moderate diverticulosis in the entire examined colon. There was evidence of an impacted diverticulum. There was no evidence of diverticular bleeding. No blood in the colon.  - The exam was otherwise normal on direct and retroflexion views.  Recent Labs    07/09/23 0420 07/09/23 0601 07/09/23 1203 07/10/23 0514  WBC 11.4*  --  11.6* 10.5  HGB 9.0* 9.0* 8.7* 6.7*  HCT 30.0* 29.9* 28.5* 22.2*  PLT 288  --  261 223   BMET Recent Labs    07/08/23 0101 07/09/23 0420 07/10/23 0514  NA 137 139 138  K 3.6 4.3 4.3  CL 101 108 110  CO2 26 24 22   GLUCOSE 104* 93 89  BUN 20 18 14   CREATININE 0.85 0.89 0.77  CALCIUM 9.3 8.4* 7.9*   LFT Recent Labs    07/07/23 1520  PROT 8.0  ALBUMIN 4.0  AST 16  ALT 15  ALKPHOS 54  BILITOT 0.5   PT/INR Recent Labs    07/08/23 0101  LABPROT 13.9  INR 1.1       Vital signs in last 24 hours: Temp:  [98.5 F (36.9 C)-99.2 F (37.3 C)] 99.2 F (37.3 C) (08/24 1039) Pulse Rate:  [82-96] 83 (08/24 1039) Resp:  [18] 18 (08/24 1039) BP: (107-129)/(63-93) 129/74 (08/24 1039) SpO2:  [95 %-100 %] 99 % (08/24  1039) FiO2 (%):  [21 %] 21 % (08/23 2214) Last BM Date : 07/10/23  Abd soft, slightly tender LLQ BS +   LOS: 1 day   Stan Head ,MD 07/10/2023, 10:57 AM

## 2023-07-10 NOTE — Progress Notes (Signed)
[facility] Progress Note  Patient: Carol Mueller    VWU:[id number]  DOA: 07/07/2023     Date of Service: the patient was seen and examined on 07/10/2023  Chief Complaint  Patient presents with   Melena   Brief hospital course: This is a 62 year old female with past medical history of pandiverticulosis, recurrent diverticular bleeding, T2DM, HTN.  She presents with complaints of blood in her stool and abdominal pain, first noted on Monday.  Following breakfast she had diarrheal episodes BRBPR.  Total of 3 during a course of the day.  She touch bases with her GI, went on bowel rest, not eating until she had a light supper Tuesday afternoon.  She woke this a.m. with dark red burgundy bloody diarrhea.  She touch base with her GI who recommended she go to the ER [note in chart].  Patient's last GI bleed episode was 2 years ago on the 21st, today makes exactly 2 years.  Patient endorses LUQ pain which she describes as burning and gassy.  She took Tums, then Mylanta and had some relief.  Per patient's her GERD has been getting worse over this past year.  She watches what she eats, has had to cut back on coffee and has cut out chocolate.  She does not eat spicy food, she is not a scheduled medication for GERD. She denies history of stomach ulcers.  Patient is not on blood thinners.  She endorses mild lightheadedness on Monday, none since   In the ER patient is hemodynamically stable.  Hemoglobin 13.2.  Per patient her hemoglobin was 14.1 during to physical a few weeks prior.  Patient has occult stool positive, burgundy. Last endoscopy is January 2022 pancolitis, internal hemorrhoids, 2 polyps removed with cold snare. 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  A few sessile polyps with no bleeding or stigmata of recent bleeding in the gastric fundus and body.  Capsule study done.  07/09/2023: Patient continues to have bright red blood per rectum.  GI team is directing care.  GI input is highly  appreciated.  Continue to monitor H/H.  07/10/2023: Patient seen.  Patient continues to report rectal bleed, but darker colored blood.  Suspect will bleed.  Hemoglobin is down to 6.7 g/dL.  Patient is receiving 1 unit of packed red blood cells.  Will follow-up posttransfusion H&H.  Further management depend on above.  Supportive care for diverticular bleed.   Assessment and Plan:  # Recurrent diverticular bleeding: -Patient has pan diverticulosis  -Pt has h/o internal hemorrhoids and adenomatous polyps -Continue supportive care. -Monitor H/H. -Patient is currently being treated transfused with 1 unit of packed red blood cells.  Hemoglobin dropped to 6.7 g/dL.  Follow H/H.   -IV Protonix twice daily ordered given darkened stool, progressively worsening GERD, epigastric tenderness. -GI is directing care. -CT angio abdomen and pelvis without evidence of active GI bleed.  Extensive colonic diverticulosis without acute diverticulitis noted  # Essential hypertension -Coreg and Benicar resumed.   Monitor BP and titrate medications accordingly 07/10/2023: Blood pressure is controlled.  # T2DM -Sliding scale insulin only. -Hold 7030 for now.     # Cushing's disease, h/o R adrenalectomy   Body mass index is 41.27 kg/m.  Interventions:  Diet: NPO DVT Prophylaxis: SCD, pharmacological prophylaxis contraindicated due to GI bleed    Advance goals of care discussion: Full code  Family Communication: family was present at bedside, at the time of interview.  The pt provided permission to discuss medical plan  with the family. Opportunity was given to ask question and all questions were answered satisfactorily.   Disposition:  Pt is from Home, admitted with Lower GI bleeding, still has risk of bleeding, which precludes a safe discharge. Discharge to Home, when stable and cleared by GI.  Subjective:  -No bright red blood per rectum today. -Dark blood per rectum reported (likely old  bleed).  Physical Exam: General: Not in any distress.  Awake and alert.  Exophthalmos is noted. HEENT: Patient is pale.  Exophthalmos is noted.  Neck: no JVD, supple. Cardiovascular: S1 and S2. Respiratory: Clear to auscultation. Abdomen: Obese, soft and nontender. Extremities: No leg edema.   Neurologic: Patient is awake and alert.  Patient moves all extremities.  Vitals:   07/09/23 0558 07/09/23 1325 07/09/23 2058 07/10/23 0544  BP: (!) 141/76 (!) 109/93 107/70 118/63  Pulse: 92 96 86 82  Resp: 18 18 18 18   Temp: 98.4 F (36.9 C) 98.6 F (37 C) 98.5 F (36.9 C) 98.7 F (37.1 C)  TempSrc: Oral Oral Oral Oral  SpO2: 98% 95% 100% 98%  Weight:      Height:        Intake/Output Summary (Last 24 hours) at 07/10/2023 1023 Last data filed at 07/10/2023 0600 Gross per 24 hour  Intake 2931.03 ml  Output 0 ml  Net 2931.03 ml   Filed Weights   07/07/23 1508  Weight: 105.7 kg     CBC: Recent Labs  Lab 07/07/23 1520 07/08/23 0706 07/08/23 2202 07/09/23 0420 07/09/23 0601 07/09/23 1203 07/10/23 0514  WBC 11.8*  --   --  11.4*  --  11.6* 10.5  HGB 13.2   < > 10.0* 9.0* 9.0* 8.7* 6.7*  HCT 43.4   < > 33.0* 30.0* 29.9* 28.5* 22.2*  MCV 71.7*  --   --  71.4*  --  73.3* 72.5*  PLT 360  --   --  288  --  261 223   < > = values in this interval not displayed.   Basic Metabolic Panel: Recent Labs  Lab 07/07/23 1520 07/08/23 0101 07/09/23 0420 07/10/23 0514  NA 136 137 139 138  K 3.7 3.6 4.3 4.3  CL 99 101 108 110  CO2 25 26 24 22   GLUCOSE 117* 104* 93 89  BUN 21 20 18 14   CREATININE 0.84 0.85 0.89 0.77  CALCIUM 9.9 9.3 8.4* 7.9*  MG  --   --  1.8 1.8  PHOS  --   --  3.0 2.4*    Studies: CT ANGIO GI BLEED  Result Date: 07/09/2023 CLINICAL DATA:  Rectal bleeding and generalized abdominal pain for several days. EXAM: CTA ABDOMEN AND PELVIS WITHOUT AND WITH CONTRAST TECHNIQUE: Multidetector CT imaging of the abdomen and pelvis was performed using the standard  protocol during bolus administration of intravenous contrast. Multiplanar reconstructed images and MIPs were obtained and reviewed to evaluate the vascular anatomy. RADIATION DOSE REDUCTION: This exam was performed according to the departmental dose-optimization program which includes automated exposure control, adjustment of the mA and/or kV according to patient size and/or use of iterative reconstruction technique. CONTRAST:  OMNIPAQUE IOHEXOL 350 MG/ML SOLN COMPARISON:  July 07, 2023. FINDINGS: VASCULAR Aorta: Normal caliber aorta without aneurysm, dissection, vasculitis or significant stenosis. Celiac: Patent without evidence of aneurysm, dissection, vasculitis or significant stenosis. SMA: Patent without evidence of aneurysm, dissection, vasculitis or significant stenosis. Renals: Both renal arteries are patent without evidence of aneurysm, dissection, vasculitis, fibromuscular dysplasia or significant stenosis.  IMA: Patent without evidence of aneurysm, dissection, vasculitis or significant stenosis. Inflow: Patent without evidence of aneurysm, dissection, vasculitis or significant stenosis. Proximal Outflow: Bilateral common femoral and visualized portions of the superficial and profunda femoral arteries are patent without evidence of aneurysm, dissection, vasculitis or significant stenosis. Veins: No obvious venous abnormality within the limitations of this arterial phase study. Review of the MIP images confirms the above findings. NON-VASCULAR Lower chest: No acute abnormality. Hepatobiliary: No focal liver abnormality is seen. Status post cholecystectomy. No biliary dilatation. Pancreas: Unremarkable. No pancreatic ductal dilatation or surrounding inflammatory changes. Spleen: Normal in size without focal abnormality. Adrenals/Urinary Tract: Status post right adrenalectomy. Left adrenal gland is unremarkable. Stable bilateral renal cysts are noted for which no further follow-up is required. No  hydronephrosis or renal obstruction is noted. Urinary bladder is unremarkable. Stomach/Bowel: The stomach is unremarkable. There is no evidence of bowel obstruction or inflammation. The appendix appears normal. Diverticulosis is noted throughout the colon. No definite evidence of gastrointestinal bleeding is noted. Lymphatic: No adenopathy is noted. Reproductive: Multiple calcified uterine fibroids are noted. No adnexal abnormality. Other: Small fat containing periumbilical hernia. No abdominopelvic ascites. Musculoskeletal: No acute or significant osseous findings. IMPRESSION: VASCULAR No definite evidence of active gastrointestinal bleeding. NON-VASCULAR Diffuse colonic diverticulosis is noted without inflammation. Multiple calcified uterine fibroids are noted. Electronically Signed   By: Lupita Raider M.D.   On: 07/09/2023 13:58    Scheduled Meds:  sodium chloride   Intravenous Once   carvedilol  25 mg Oral BID   insulin aspart  0-15 Units Subcutaneous Q4H   insulin aspart protamine- aspart  15 Units Subcutaneous Q supper   insulin aspart protamine- aspart  5 Units Subcutaneous Q breakfast   irbesartan  75 mg Oral Daily   pantoprazole (PROTONIX) IV  40 mg Intravenous Q12H   saccharomyces boulardii  250 mg Oral BID   Continuous Infusions:  0.9 % NaCl with KCl 20 mEq / L 125 mL/hr at 07/10/23 1021   PRN Meds: acetaminophen **OR** acetaminophen, methocarbamol, morphine injection, ondansetron **OR** ondansetron (ZOFRAN) IV, oxyCODONE  Time spent: 35 minutes  Author: Berton Mount, MD [facility] 07/10/2023 10:23 AM  To reach On-call, see care teams to locate the attending and reach out to them via [email or web address]. If 7PM-7AM, please contact night-coverage If you still have difficulty reaching the attending provider, please page the [facility] (Director on Call) for [facility] on amion for assistance.

## 2023-07-10 NOTE — Progress Notes (Signed)
    Patient Name: Carol Mueller           DOB: July 17, 1961  MRN: [id number]      Admission Date: 07/07/2023  Attending Provider: Barnetta Chapel, MD  Primary Diagnosis: Diverticulosis of intestine with bleeding   Level of care: Telemetry    CROSS COVER NOTE   Date of Service   07/10/2023   Carol Mueller, 62 y.o. female, was admitted on 07/07/2023 for Diverticulosis of intestine with bleeding.    HPI/Events of Note   Recurrent diverticular bleedin  Hemoglobin 8.7 -->  6.7.  No acute changes reported.  Hemodynamically stable. RN reports pt had 1 dark red BM.    Interventions/ Plan   Blood transfusion, 1 unit PRBC        Anthoney Harada, DNP, [employer] [facility] [place]

## 2023-07-11 DIAGNOSIS — D62 Acute posthemorrhagic anemia: Secondary | ICD-10-CM | POA: Diagnosis not present

## 2023-07-11 DIAGNOSIS — K5791 Diverticulosis of intestine, part unspecified, without perforation or abscess with bleeding: Secondary | ICD-10-CM | POA: Diagnosis not present

## 2023-07-11 DIAGNOSIS — K5731 Diverticulosis of large intestine without perforation or abscess with bleeding: Secondary | ICD-10-CM | POA: Diagnosis not present

## 2023-07-11 LAB — BASIC METABOLIC PANEL
Anion gap: 5 (ref 5–15)
BUN: 9 mg/dL (ref 8–23)
CO2: 23 mmol/L (ref 22–32)
Calcium: 8.2 mg/dL — ABNORMAL LOW (ref 8.9–10.3)
Chloride: 110 mmol/L (ref 98–111)
Creatinine, Ser: 0.73 mg/dL (ref 0.44–1.00)
GFR, Estimated: >60 mL/min (ref >60)
Glucose, Bld: 100 mg/dL — ABNORMAL HIGH (ref 70–99)
Potassium: 3.7 mmol/L (ref 3.5–5.1)
Sodium: 138 mmol/L (ref 135–145)

## 2023-07-11 LAB — PROTIME-INR
INR: 1.1 (ref 0.8–1.2)
Prothrombin Time: 14 s (ref 11.4–15.2)

## 2023-07-11 LAB — MAGNESIUM: Magnesium: 1.7 mg/dL (ref 1.7–2.4)

## 2023-07-11 LAB — CBC
HCT: 23.8 % — ABNORMAL LOW (ref 36.0–46.0)
Hemoglobin: 7.3 g/dL — ABNORMAL LOW (ref 12.0–15.0)
MCH: 23 pg — ABNORMAL LOW (ref 26.0–34.0)
MCHC: 30.7 g/dL (ref 30.0–36.0)
MCV: 74.8 fL — ABNORMAL LOW (ref 80.0–100.0)
Platelets: 227 10*3/uL (ref 150–400)
RBC: 3.18 MIL/uL — ABNORMAL LOW (ref 3.87–5.11)
RDW: 18.7 % — ABNORMAL HIGH (ref 11.5–15.5)
WBC: 10.2 10*3/uL (ref 4.0–10.5)
nRBC: 0 % (ref 0.0–0.2)

## 2023-07-11 LAB — GLUCOSE, CAPILLARY
Glucose-Capillary: 93 mg/dL (ref 70–99)
Glucose-Capillary: 109 mg/dL — ABNORMAL HIGH (ref 70–99)
Glucose-Capillary: 140 mg/dL — ABNORMAL HIGH (ref 70–99)
Glucose-Capillary: 134 mg/dL — ABNORMAL HIGH (ref 70–99)
Glucose-Capillary: 115 mg/dL — ABNORMAL HIGH (ref 70–99)
Glucose-Capillary: 152 mg/dL — ABNORMAL HIGH (ref 70–99)

## 2023-07-11 LAB — HEMOGLOBIN AND HEMATOCRIT, BLOOD
HCT: 24.5 % — ABNORMAL LOW (ref 36.0–46.0)
HCT: 23.2 % — ABNORMAL LOW (ref 36.0–46.0)
Hemoglobin: 7.6 g/dL — ABNORMAL LOW (ref 12.0–15.0)
Hemoglobin: 7.1 g/dL — ABNORMAL LOW (ref 12.0–15.0)

## 2023-07-11 LAB — PHOSPHORUS: Phosphorus: 2.7 mg/dL (ref 2.5–4.6)

## 2023-07-11 MED ORDER — DICYCLOMINE HCL 20 MG PO TABS
20.0000 mg | ORAL_TABLET | Freq: Four times a day (QID) | ORAL | Status: DC | PRN
  Administered 2023-07-11: 20 mg via ORAL
  Filled 2023-07-11 (×2): qty 1

## 2023-07-11 NOTE — Progress Notes (Signed)
[facility] Progress Note  Patient: Carol Mueller    UJW:[id number]  DOA: 07/07/2023     Date of Service: the patient was seen and examined on 07/11/2023  Chief Complaint  Patient presents with   Melena   Brief hospital course: This is a 62 year old female with past medical history of pandiverticulosis, recurrent diverticular bleeding, T2DM, HTN.  She presents with complaints of blood in her stool and abdominal pain, first noted on Monday.  Following breakfast she had diarrheal episodes BRBPR.  Total of 3 during a course of the day.  She touch bases with her GI, went on bowel rest, not eating until she had a light supper Tuesday afternoon.  She woke this a.m. with dark red burgundy bloody diarrhea.  She touch base with her GI who recommended she go to the ER [note in chart].  Patient's last GI bleed episode was 2 years ago on the 21st, today makes exactly 2 years.  Patient endorses LUQ pain which she describes as burning and gassy.  She took Tums, then Mylanta and had some relief.  Per patient's her GERD has been getting worse over this past year.  She watches what she eats, has had to cut back on coffee and has cut out chocolate.  She does not eat spicy food, she is not a scheduled medication for GERD. She denies history of stomach ulcers.  Patient is not on blood thinners.  She endorses mild lightheadedness on Monday, none since   In the ER patient is hemodynamically stable.  Hemoglobin 13.2.  Per patient her hemoglobin was 14.1 during to physical a few weeks prior.  Patient has occult stool positive, burgundy. Last endoscopy is January 2022 pancolitis, internal hemorrhoids, 2 polyps removed with cold snare. 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  A few sessile polyps with no bleeding or stigmata of recent bleeding in the gastric fundus and body.  Capsule study done.  07/11/2023: Patient was transfused with 1 unit of PRBC yesterday.  H&H remained stable.  No bright red blood per rectum  overnight.  However, patient reports that she is beginning to notice bright red blood per rectum.  Will monitor H/H every 8 hours.  Supportive care for now.  GI input is appreciated.  Assessment and Plan:  # Recurrent diverticular bleeding: -Patient has pan diverticulosis  -Pt has h/o internal hemorrhoids and adenomatous polyps -Continue supportive care. -Monitor H/H. -Patient is currently being treated transfused with 1 unit of packed red blood cells.  Hemoglobin dropped to 6.7 g/dL.  Follow H/H.   -IV Protonix twice daily ordered given darkened stool, progressively worsening GERD, epigastric tenderness. -GI is directing care. -CT angio abdomen and pelvis without evidence of active GI bleed.  Extensive colonic diverticulosis without acute diverticulitis noted 07/11/2023: H/H is stable overnight.  Continue to monitor closely.  Black stools overnight.  However, patient is beginning to report bright red blood per rectum.  # Essential hypertension -Coreg and Benicar resumed.   Monitor BP and titrate medications accordingly 07/11/2023: Blood pressure is controlled.  # T2DM -Sliding scale insulin only. -Hold 7030 for now.     # Cushing's disease, h/o R adrenalectomy   Body mass index is 41.27 kg/m.  Interventions:  DVT Prophylaxis: SCD, pharmacological prophylaxis contraindicated due to GI bleed    Advance goals of care discussion: Full code  Family Communication: family was present at bedside, at the time of interview.  The pt provided permission to discuss medical plan with the family. Opportunity  was given to ask question and all questions were answered satisfactorily.   Disposition:  Pt is from Home, admitted with Lower GI bleeding, still has risk of bleeding, which precludes a safe discharge. Discharge to Home, when stable and cleared by GI.  Subjective:  -Bright red blood per rectum today. -Dark blood per rectum reported overnight (likely old bleed).  Physical  Exam: General: Not in any distress.  Awake and alert.  Exophthalmos is noted. HEENT: Patient is pale.  Exophthalmos is noted.  Neck: no JVD, supple. Cardiovascular: S1 and S2. Respiratory: Clear to auscultation. Abdomen: Obese, soft and nontender. Extremities: No leg edema.   Neurologic: Patient is awake and alert.  Patient moves all extremities.  Vitals:   07/10/23 1333 07/10/23 1512 07/10/23 2036 07/11/23 0610  BP: 120/67 (!) 103/55 114/60 129/72  Pulse: 85 89 83 80  Resp: 18 18 20 20   Temp: 98.5 F (36.9 C) 98.7 F (37.1 C) 98.8 F (37.1 C) 97.8 F (36.6 C)  TempSrc: Oral Oral Oral Oral  SpO2: 100% 100% 99% 100%  Weight:      Height:        Intake/Output Summary (Last 24 hours) at 07/11/2023 1256 Last data filed at 07/11/2023 1000 Gross per 24 hour  Intake 4213.77 ml  Output --  Net 4213.77 ml   Filed Weights   07/07/23 1508  Weight: 105.7 kg     CBC: Recent Labs  Lab 07/07/23 1520 07/08/23 0706 07/09/23 0420 07/09/23 0601 07/09/23 1203 07/10/23 0514 07/10/23 1905 07/11/23 0520  WBC 11.8*  --  11.4*  --  11.6* 10.5  --  10.2  HGB 13.2   < > 9.0* 9.0* 8.7* 6.7* 7.4* 7.3*  HCT 43.4   < > 30.0* 29.9* 28.5* 22.2* 23.6* 23.8*  MCV 71.7*  --  71.4*  --  73.3* 72.5*  --  74.8*  PLT 360  --  288  --  261 223  --  227   < > = values in this interval not displayed.   Basic Metabolic Panel: Recent Labs  Lab 07/07/23 1520 07/08/23 0101 07/09/23 0420 07/10/23 0514 07/11/23 0520  NA 136 137 139 138 138  K 3.7 3.6 4.3 4.3 3.7  CL 99 101 108 110 110  CO2 25 26 24 22 23   GLUCOSE 117* 104* 93 89 100*  BUN 21 20 18 14 9   CREATININE 0.84 0.85 0.89 0.77 0.73  CALCIUM 9.9 9.3 8.4* 7.9* 8.2*  MG  --   --  1.8 1.8 1.7  PHOS  --   --  3.0 2.4* 2.7    Studies: No results found.  Scheduled Meds:  carvedilol  25 mg Oral BID   insulin aspart  0-15 Units Subcutaneous Q4H   insulin aspart protamine- aspart  15 Units Subcutaneous Q supper   insulin aspart  protamine- aspart  5 Units Subcutaneous Q breakfast   irbesartan  75 mg Oral Daily   pantoprazole (PROTONIX) IV  40 mg Intravenous Q12H   saccharomyces boulardii  250 mg Oral BID   Continuous Infusions:  0.9 % NaCl with KCl 20 mEq / L 125 mL/hr at 07/11/23 1246   PRN Meds: acetaminophen **OR** acetaminophen, dicyclomine, methocarbamol, morphine injection, ondansetron **OR** ondansetron (ZOFRAN) IV, oxyCODONE  Time spent: 35 minutes  Author: Berton Mount, MD [facility] 07/11/2023 12:56 PM  To reach On-call, see care teams to locate the attending and reach out to them via [email or web address]. If 7PM-7AM, please contact night-coverage If you still  have difficulty reaching the attending provider, please page the [facility] (Director on Call) for [facility] on amion for assistance.

## 2023-07-11 NOTE — Progress Notes (Signed)
   07/11/23 2046  BiPAP/CPAP/SIPAP  $ Non-Invasive Home Ventilator  Subsequent  BiPAP/CPAP/SIPAP Pt Type Adult  BiPAP/CPAP/SIPAP DREAMSTATIOND  Mask Type Nasal mask  Mask Size Medium  FiO2 (%) 21 %  Patient Home Equipment No  Auto Titrate Yes

## 2023-07-11 NOTE — Progress Notes (Signed)
Daily Progress Note  DOA: 07/07/2023 Hospital Day: 5  Chief Complaint: rectal bleeding  ASSESSMENT & PLAN   Brief Narrative:  Carol Mueller is a 62 y.o. year old female with a history of recurrent GI bleeds ( presumed diverticular), DM2, HTN, Cushings disease  Hematochezia, ongoing. Bleeding overall painless, ome generalized abdominal discomfort . Has LUQ pain but this is more chronic. Suspect stuttering recurrent diverticular hemorrhage. CTA negative for bleeding on admission and again 8/23  Seepage of blood w/ flatus and some otherwise - less so - no stools overnight Hgb stable at 7.3 post transfusion Supportive care to continue Will follow and adjust care depending upon clinical course but diverticular hemorrhage usually stop on there own unless we are lucky enough to fine and treat the bleeding which is intermittent.  I am going to allow soft diet  Anemia of acute blood loss    LUQ pain, chronic. No abnormalities of stomach, spleen or pancreas on CTA.  --Continue PPI - add back dicyclomine and Rx at dc please     Subjective   Asking to restart dicyclomine for her LUQ pain (chronic) Some seepage of blood but no stools overnight Would like solid food    Objective   Previous GI studies:  1/22 Endoscopy Impression:  - Ectopic gastric mucosa in the upper third of the esophagus. - Normal esophagus.  - A few gastric polyps.  - Normal incisura, antrum and pylorus.  - Normal duodenal bulb, first portion of the duodenum and second portion of the duodenum.  - Successful completion of the Video Capsule Enteroscope placement.  - No specimens collected.     Small capsule endoscopy Small bowel endoscopy revealed small area of focal erythema versus subtle nonbleeding erosion in distal small bowel.  This appears to be an incidental finding and not likely to be of clinical, sequence and unlikely to be the source of recurrent bleeding.  Otherwise completely normal  exam.  No blood noted throughout small bowel lumen and in the visualized colon.  Based on these findings and recent extensive evaluation favor recurrent diverticular bleed.   1/22 Colonoscopy Impression:  - One 9 mm polyp at the appendiceal orifice, removed with a cold snare. Resected and retrieved.  - One 6 mm polyp in the transverse colon, removed with a cold snare. Resected and retrieved.  - Internal hemorrhoids.  - Moderate diverticulosis in the entire examined colon. There was evidence of an impacted diverticulum. There was no evidence of diverticular bleeding. No blood in the colon.  - The exam was otherwise normal on direct and retroflexion views.  Recent Labs    07/09/23 1203 07/10/23 0514 07/10/23 1905 07/11/23 0520  WBC 11.6* 10.5  --  10.2  HGB 8.7* 6.7* 7.4* 7.3*  HCT 28.5* 22.2* 23.6* 23.8*  PLT 261 223  --  227   BMET Recent Labs    07/09/23 0420 07/10/23 0514 07/11/23 0520  NA 139 138 138  K 4.3 4.3 3.7  CL 108 110 110  CO2 24 22 23   GLUCOSE 93 89 100*  BUN 18 14 9   CREATININE 0.89 0.77 0.73  CALCIUM 8.4* 7.9* 8.2*    PT/INR Recent Labs    07/11/23 0520  LABPROT 14.0  INR 1.1       Vital signs in last 24 hours: Temp:  [97.8 F (36.6 C)-99.2 F (37.3 C)] 97.8 F (36.6 C) (08/25 0610) Pulse Rate:  [80-92] 80 (08/25 0610) Resp:  [18-20] 20 (08/25 0610)  BP: (102-129)/(55-74) 129/72 (08/25 0610) SpO2:  [99 %-100 %] 100 % (08/25 0610) Last BM Date : 07/10/23    LOS: 2 days   Stan Head ,MD 07/11/2023, 8:58 AM

## 2023-07-11 NOTE — Progress Notes (Signed)
   07/10/23 2033  BiPAP/CPAP/SIPAP  BiPAP/CPAP/SIPAP Pt Type Adult  BiPAP/CPAP/SIPAP DREAMSTATIOND  Mask Type Nasal mask  Mask Size Medium  FiO2 (%) 21 %  Patient Home Equipment No  Auto Titrate Yes  MEWS Score/Color  MEWS Score 0  MEWS Score Color [place]

## 2023-07-12 DIAGNOSIS — K5731 Diverticulosis of large intestine without perforation or abscess with bleeding: Secondary | ICD-10-CM | POA: Diagnosis not present

## 2023-07-12 DIAGNOSIS — K5791 Diverticulosis of intestine, part unspecified, without perforation or abscess with bleeding: Secondary | ICD-10-CM | POA: Diagnosis not present

## 2023-07-12 DIAGNOSIS — D62 Acute posthemorrhagic anemia: Secondary | ICD-10-CM | POA: Diagnosis not present

## 2023-07-12 LAB — CBC
HCT: 22.2 % — ABNORMAL LOW (ref 36.0–46.0)
Hemoglobin: 6.9 g/dL — CL (ref 12.0–15.0)
MCH: 23.2 pg — ABNORMAL LOW (ref 26.0–34.0)
MCHC: 31.1 g/dL (ref 30.0–36.0)
MCV: 74.7 fL — ABNORMAL LOW (ref 80.0–100.0)
Platelets: 248 10*3/uL (ref 150–400)
RBC: 2.97 MIL/uL — ABNORMAL LOW (ref 3.87–5.11)
RDW: 19.6 % — ABNORMAL HIGH (ref 11.5–15.5)
WBC: 10 10*3/uL (ref 4.0–10.5)
nRBC: 0.2 % (ref 0.0–0.2)

## 2023-07-12 LAB — BASIC METABOLIC PANEL
Anion gap: 3 — ABNORMAL LOW (ref 5–15)
BUN: 7 mg/dL — ABNORMAL LOW (ref 8–23)
CO2: 23 mmol/L (ref 22–32)
Calcium: 7.9 mg/dL — ABNORMAL LOW (ref 8.9–10.3)
Chloride: 112 mmol/L — ABNORMAL HIGH (ref 98–111)
Creatinine, Ser: 0.71 mg/dL (ref 0.44–1.00)
GFR, Estimated: >60 mL/min (ref >60)
Glucose, Bld: 118 mg/dL — ABNORMAL HIGH (ref 70–99)
Potassium: 3.8 mmol/L (ref 3.5–5.1)
Sodium: 138 mmol/L (ref 135–145)

## 2023-07-12 LAB — PREPARE RBC (CROSSMATCH)

## 2023-07-12 LAB — GLUCOSE, CAPILLARY
Glucose-Capillary: 115 mg/dL — ABNORMAL HIGH (ref 70–99)
Glucose-Capillary: 103 mg/dL — ABNORMAL HIGH (ref 70–99)
Glucose-Capillary: 101 mg/dL — ABNORMAL HIGH (ref 70–99)
Glucose-Capillary: 117 mg/dL — ABNORMAL HIGH (ref 70–99)
Glucose-Capillary: 132 mg/dL — ABNORMAL HIGH (ref 70–99)
Glucose-Capillary: 143 mg/dL — ABNORMAL HIGH (ref 70–99)

## 2023-07-12 LAB — HEMOGLOBIN AND HEMATOCRIT, BLOOD
HCT: 25.9 % — ABNORMAL LOW (ref 36.0–46.0)
Hemoglobin: 8.1 g/dL — ABNORMAL LOW (ref 12.0–15.0)

## 2023-07-12 MED ORDER — SODIUM CHLORIDE 0.9% IV SOLUTION: Freq: Once | INTRAVENOUS | Status: AC

## 2023-07-12 NOTE — Progress Notes (Signed)
Notified J. Garner Nash, NP of pt's current hgb of 6.9. Orders received.

## 2023-07-12 NOTE — Progress Notes (Addendum)
Patient ID: Carol Mueller, female   DOB: 1961/10/04, 62 y.o.   MRN: [id number]    Progress Note   Subjective   Day #5 CC; acute lower GI bleed  Tolerated diet advancement yesterday however after eating solid food at lunchtime yesterday had a bowel movement with dark red blood followed by darker blackish blood later in the afternoon.  She did not have any bowel movements overnight, did eat breakfast this morning and feels that she will need to have a bowel movement soon.  She also says she has been having some more chronic left upper quadrant pain present over the past few months  Hemoglobin 7.3 yesterday> 7.1> 6.9 this a.m. BUN 7/creatinine 0.71/potassium 3.8   Objective   Vital signs in last 24 hours: Temp:  [98.4 F (36.9 C)-98.7 F (37.1 C)] 98.4 F (36.9 C) (08/26 0556) Pulse Rate:  [78-84] 84 (08/26 0556) Resp:  [19] 19 (08/26 0556) BP: (102-124)/(60-72) 108/68 (08/26 0556) SpO2:  [98 %-100 %] 98 % (08/26 0556) FiO2 (%):  [21 %] 21 % (08/25 2046) Last BM Date : 07/11/23 General:    Older African-American female in NAD, out of bed sitting at the window Heart:  Regular rate and rhythm; no murmurs Lungs: Respirations even and unlabored, lungs CTA bilaterally Abdomen:  Soft, obese,  nondistended, there is some tenderness in the left upper quadrant epigastrium,. Normal bowel sounds. Extremities:  Without edema. Neurologic:  Alert and oriented,  grossly normal neurologically. Psych:  Cooperative. Normal mood and affect.  Intake/Output from previous day: 08/25 0701 - 08/26 0700 In: 3817.2 [P.O.:1180; I.V.:2637.2] Out: 0  Intake/Output this shift: No intake/output data recorded.  Lab Results: Recent Labs    07/10/23 0514 07/10/23 1905 07/11/23 0520 07/11/23 1337 07/11/23 2248 07/12/23 0501  WBC 10.5  --  10.2  --   --  10.0  HGB 6.7*   < > 7.3* 7.6* 7.1* 6.9*  HCT 22.2*   < > 23.8* 24.5* 23.2* 22.2*  PLT 223  --  227  --   --  248   < > = values in this interval  not displayed.   BMET Recent Labs    07/10/23 0514 07/11/23 0520 07/12/23 0501  NA 138 138 138  K 4.3 3.7 3.8  CL 110 110 112*  CO2 22 23 23   GLUCOSE 89 100* 118*  BUN 14 9 7*  CREATININE 0.77 0.73 0.71  CALCIUM 7.9* 8.2* 7.9*   LFT No results for input(s): "PROT", "ALBUMIN", "AST", "ALT", "ALKPHOS", "BILITOT", "BILIDIR", "IBILI" in the last 72 hours. PT/INR Recent Labs    07/11/23 0520  LABPROT 14.0  INR 1.1        Assessment / Plan:    #42 62 year old African-American female with history of recurrent diverticular hemorrhage.  Admitted 5 days ago with recurrent hematochezia CTA negative for active bleeding x 2 this admission  Hemoglobin drifted about half a gram over the past 24 hours now down to 6.9 She did have an episode of hematochezia yesterday afternoon, none since  Suspect stuttering diverticular bleed Last colonoscopy 2022-2 polyps removed and multiple medium sized diverticuli throughout the entire colon EGD 2022 done for bleeding few sessile nonbleeding polyps in the gastric fundus otherwise stomach normal normal to the second portion of the duodenum and capsule was placed at that time  #2Adult onset diabetes mellitus #3.  Hypertension #4.  Cushing's disease  Plan; leave on soft diet for now Continue to trend hemoglobin-transfuse 1 unit of packed RBCs  today If she has any further bleeding today we will plan for bowel purge, then consider colonoscopy for tomorrow.     Principal Problem:   Diverticulosis of intestine with bleeding Active Problems:   Diabetes mellitus (HCC)   Essential hypertension   Hyperlipidemia with target LDL less than 100   GI bleed   Pancolonic diverticulosis   Internal hemorrhoids     LOS: 3 days   Amy Esterwood PA-C 07/12/2023, 9:56 AM  GI ATTENDING  Case reviewed with Dr. Leone Payor.  Interval history data reviewed.  Agree with interval progress note.  Patient personally seen and examined.  She is sitting comfortably  in her recliner this afternoon.  No bowel movements today.  She did receive 1 unit of packed red blood cells.  No other complaints.  Tolerating limited diet.  Acute diverticular bleed.  Has slowly resolved.  Continue to monitor.  No plans for colonoscopy.  Will follow.  Wilhemina Bonito. Eda Keys., M.D. [facility] Division of Gastroenterology

## 2023-07-12 NOTE — Progress Notes (Signed)
[facility] Progress Note  Patient: Carol Mueller    GUY:[id number]  DOA: 07/07/2023     Date of Service: the patient was seen and examined on 07/12/2023  Chief Complaint  Patient presents with   Melena   Brief hospital course: This is a 62 year old female with past medical history of pandiverticulosis, recurrent diverticular bleeding, T2DM, HTN.  She presents with complaints of blood in her stool and abdominal pain, first noted on Monday.  Following breakfast she had diarrheal episodes BRBPR.  Total of 3 during a course of the day.  She touch bases with her GI, went on bowel rest, not eating until she had a light supper Tuesday afternoon.  She woke this a.m. with dark red burgundy bloody diarrhea.  She touch base with her GI who recommended she go to the ER [note in chart].  Patient's last GI bleed episode was 2 years ago on the 21st, today makes exactly 2 years.  Patient endorses LUQ pain which she describes as burning and gassy.  She took Tums, then Mylanta and had some relief.  Per patient's her GERD has been getting worse over this past year.  She watches what she eats, has had to cut back on coffee and has cut out chocolate.  She does not eat spicy food, she is not a scheduled medication for GERD. She denies history of stomach ulcers.  Patient is not on blood thinners.  She endorses mild lightheadedness on Monday, none since   In the ER patient is hemodynamically stable.  Hemoglobin 13.2.  Per patient her hemoglobin was 14.1 during to physical a few weeks prior.  Patient has occult stool positive, burgundy. Last endoscopy is January 2022 pancolitis, internal hemorrhoids, 2 polyps removed with cold snare. 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  A few sessile polyps with no bleeding or stigmata of recent bleeding in the gastric fundus and body.  Capsule study done.  07/11/2023: Patient was transfused with 1 unit of PRBC yesterday.  H&H remained stable.  No bright red blood per rectum  overnight.  However, patient reports that she is beginning to notice bright red blood per rectum.  Will monitor H/H every 8 hours.  Supportive care for now.  GI input is appreciated.  07/12/2023: No further bleeding reported overnight.  Hemoglobin dropped to 6.9 g/dL.  Patient has been transfused with 1 unit of packed red blood cells.  Will continue to monitor for now.  Assessment and Plan:  # Recurrent diverticular bleeding: -Patient has pan diverticulosis  -Pt has h/o internal hemorrhoids and adenomatous polyps -Continue supportive care. -Monitor H/H. -Patient is currently being treated transfused with 1 unit of packed red blood cells.  Hemoglobin dropped to 6.7 g/dL.  Follow H/H.   -IV Protonix twice daily ordered given darkened stool, progressively worsening GERD, epigastric tenderness. -GI is directing care. -CT angio abdomen and pelvis without evidence of active GI bleed.  Extensive colonic diverticulosis without acute diverticulitis noted 07/11/2023: H/H is stable overnight.  Continue to monitor closely.  Black stools overnight.  However, patient is beginning to report bright red blood per rectum. 07/12/2023: Hemoglobin dropped to 6.9 g/dL.  Patient has been transfused with 1 unit of packed red blood cells.  # Essential hypertension -Coreg and Benicar resumed.   Monitor BP and titrate medications accordingly 07/12/2023: Blood pressure is controlled.  # T2DM -Sliding scale insulin only. -Hold 7030 for now.     # Cushing's disease, h/o R adrenalectomy   Body mass index  is 41.27 kg/m.  Interventions:  DVT Prophylaxis: SCD, pharmacological prophylaxis contraindicated due to GI bleed    Advance goals of care discussion: Full code  Family Communication: family was present at bedside, at the time of interview.  The pt provided permission to discuss medical plan with the family. Opportunity was given to ask question and all questions were answered satisfactorily.   Disposition:   Pt is from Home, admitted with Lower GI bleeding, still has risk of bleeding, which precludes a safe discharge. Discharge to Home, when stable and cleared by GI.  Subjective:  -Bright red blood per rectum today. -Dark blood per rectum reported overnight (likely old bleed).  Physical Exam: General: Not in any distress.  Awake and alert.  Exophthalmos is noted. HEENT: Patient is pale.  Exophthalmos is noted.  Neck: no JVD, supple. Cardiovascular: S1 and S2. Respiratory: Clear to auscultation. Abdomen: Obese, soft and nontender. Extremities: No leg edema.   Neurologic: Patient is awake and alert.  Patient moves all extremities.  Vitals:   07/12/23 1019 07/12/23 1030 07/12/23 1315 07/12/23 1327  BP: 133/66 (!) 142/76 (!) 140/71 123/70  Pulse: 88 87 82 82  Resp: 15 17 16 16   Temp: 98.8 F (37.1 C) 98.9 F (37.2 C) 98.6 F (37 C) 98.1 F (36.7 C)  TempSrc: Oral Oral Oral Oral  SpO2:  99% 100% 100%  Weight:      Height:        Intake/Output Summary (Last 24 hours) at 07/12/2023 1710 Last data filed at 07/12/2023 1500 Gross per 24 hour  Intake 4156.55 ml  Output 0 ml  Net 4156.55 ml   Filed Weights   07/07/23 1508  Weight: 105.7 kg     CBC: Recent Labs  Lab 07/09/23 0420 07/09/23 0601 07/09/23 1203 07/10/23 0514 07/10/23 1905 07/11/23 0520 07/11/23 1337 07/11/23 2248 07/12/23 0501  WBC 11.4*  --  11.6* 10.5  --  10.2  --   --  10.0  HGB 9.0*   < > 8.7* 6.7* 7.4* 7.3* 7.6* 7.1* 6.9*  HCT 30.0*   < > 28.5* 22.2* 23.6* 23.8* 24.5* 23.2* 22.2*  MCV 71.4*  --  73.3* 72.5*  --  74.8*  --   --  74.7*  PLT 288  --  261 223  --  227  --   --  248   < > = values in this interval not displayed.   Basic Metabolic Panel: Recent Labs  Lab 07/08/23 0101 07/09/23 0420 07/10/23 0514 07/11/23 0520 07/12/23 0501  NA 137 139 138 138 138  K 3.6 4.3 4.3 3.7 3.8  CL 101 108 110 110 112*  CO2 26 24 22 23 23   GLUCOSE 104* 93 89 100* 118*  BUN 20 18 14 9  7*  CREATININE  0.85 0.89 0.77 0.73 0.71  CALCIUM 9.3 8.4* 7.9* 8.2* 7.9*  MG  --  1.8 1.8 1.7  --   PHOS  --  3.0 2.4* 2.7  --     Studies: No results found.  Scheduled Meds:  carvedilol  25 mg Oral BID   insulin aspart  0-15 Units Subcutaneous Q4H   insulin aspart protamine- aspart  15 Units Subcutaneous Q supper   insulin aspart protamine- aspart  5 Units Subcutaneous Q breakfast   irbesartan  75 mg Oral Daily   pantoprazole (PROTONIX) IV  40 mg Intravenous Q12H   saccharomyces boulardii  250 mg Oral BID   Continuous Infusions:  0.9 % NaCl with KCl  20 mEq / L 125 mL/hr at 07/12/23 0557   PRN Meds: acetaminophen **OR** acetaminophen, dicyclomine, methocarbamol, morphine injection, ondansetron **OR** ondansetron (ZOFRAN) IV, oxyCODONE  Time spent: 35 minutes  Author: Berton Mount, MD [facility] 07/12/2023 5:10 PM  To reach On-call, see care teams to locate the attending and reach out to them via [email or web address]. If 7PM-7AM, please contact night-coverage If you still have difficulty reaching the attending provider, please page the [facility] (Director on Call) for [facility] on amion for assistance.

## 2023-07-12 NOTE — Progress Notes (Signed)
   07/12/23 2318  BiPAP/CPAP/SIPAP  Reason BIPAP/CPAP not in use Other(comment) (PT refused)

## 2023-07-13 DIAGNOSIS — K5731 Diverticulosis of large intestine without perforation or abscess with bleeding: Secondary | ICD-10-CM | POA: Diagnosis not present

## 2023-07-13 DIAGNOSIS — K5791 Diverticulosis of intestine, part unspecified, without perforation or abscess with bleeding: Secondary | ICD-10-CM | POA: Diagnosis not present

## 2023-07-13 LAB — TYPE AND SCREEN
ABO/RH(D): O POS
Antibody Screen: NEGATIVE
Unit division: 0
Unit division: 0

## 2023-07-13 LAB — CBC
HCT: 25.2 % — ABNORMAL LOW (ref 36.0–46.0)
Hemoglobin: 7.9 g/dL — ABNORMAL LOW (ref 12.0–15.0)
MCH: 23.9 pg — ABNORMAL LOW (ref 26.0–34.0)
MCHC: 31.3 g/dL (ref 30.0–36.0)
MCV: 76.1 fL — ABNORMAL LOW (ref 80.0–100.0)
Platelets: 271 10*3/uL (ref 150–400)
RBC: 3.31 MIL/uL — ABNORMAL LOW (ref 3.87–5.11)
RDW: 20.6 % — ABNORMAL HIGH (ref 11.5–15.5)
WBC: 10 10*3/uL (ref 4.0–10.5)
nRBC: 0.2 % (ref 0.0–0.2)

## 2023-07-13 LAB — GLUCOSE, CAPILLARY
Glucose-Capillary: 142 mg/dL — ABNORMAL HIGH (ref 70–99)
Glucose-Capillary: 131 mg/dL — ABNORMAL HIGH (ref 70–99)
Glucose-Capillary: 106 mg/dL — ABNORMAL HIGH (ref 70–99)
Glucose-Capillary: 121 mg/dL — ABNORMAL HIGH (ref 70–99)
Glucose-Capillary: 141 mg/dL — ABNORMAL HIGH (ref 70–99)
Glucose-Capillary: 140 mg/dL — ABNORMAL HIGH (ref 70–99)

## 2023-07-13 LAB — BASIC METABOLIC PANEL
Anion gap: 9 (ref 5–15)
BUN: 7 mg/dL — ABNORMAL LOW (ref 8–23)
CO2: 23 mmol/L (ref 22–32)
Calcium: 8.5 mg/dL — ABNORMAL LOW (ref 8.9–10.3)
Chloride: 107 mmol/L (ref 98–111)
Creatinine, Ser: 0.78 mg/dL (ref 0.44–1.00)
GFR, Estimated: >60 mL/min (ref >60)
Glucose, Bld: 130 mg/dL — ABNORMAL HIGH (ref 70–99)
Potassium: 3.5 mmol/L (ref 3.5–5.1)
Sodium: 139 mmol/L (ref 135–145)

## 2023-07-13 LAB — BPAM RBC
Blood Product Expiration Date: 202409062359
Blood Product Expiration Date: 202409152359
ISSUE DATE / TIME: 202408241304
ISSUE DATE / TIME: 202408261005
Unit Type and Rh: 5100
Unit Type and Rh: 5100

## 2023-07-13 LAB — HEMOGLOBIN AND HEMATOCRIT, BLOOD
HCT: 25.5 % — ABNORMAL LOW (ref 36.0–46.0)
HCT: 24.8 % — ABNORMAL LOW (ref 36.0–46.0)
Hemoglobin: 8.1 g/dL — ABNORMAL LOW (ref 12.0–15.0)
Hemoglobin: 7.7 g/dL — ABNORMAL LOW (ref 12.0–15.0)

## 2023-07-13 NOTE — Progress Notes (Signed)
Visit made to patients room to help apply CPAP.  Patient states she will apply when she is ready for bed.

## 2023-07-13 NOTE — Progress Notes (Addendum)
Patient ID: Carol Mueller, female   DOB: [birth year], 62 y.o.   MRN: [id number]    Progress Note   Subjective   Day #6 CC; acute GI bleed  Hemoglobin 6.9 yesterday morning, transfused 1 unit> 8.1> 7.9 this a.m.  Patient says she feels okay still some vague queasy discomfort in the left upper quadrant, she did have a large "black stool" this morning x 1   Objective   Vital signs in last 24 hours: Temp:  [98.1 F (36.7 C)-98.6 F (37 C)] 98.2 F (36.8 C) (08/27 0556) Pulse Rate:  [80-83] 80 (08/27 0556) Resp:  [16-18] 18 (08/27 0556) BP: (120-140)/(70-73) 120/73 (08/27 0556) SpO2:  [99 %-100 %] 99 % (08/27 0556) Last BM Date : 07/13/23 General:    Older African-American female in NAD Heart:  Regular rate and rhythm; no murmurs Lungs: Respirations even and unlabored, lungs CTA bilaterally Abdomen:  Soft, obese, mild tenderness in the hypogastrium and left upper quadrant, no guarding or rebound ,and nondistended. Normal bowel sounds. Extremities:  Without edema. Neurologic:  Alert and oriented,  grossly normal neurologically. Psych:  Cooperative. Normal mood and affect.  Intake/Output from previous day: 08/26 0701 - 08/27 0700 In: 4123.5 [P.O.:1560; I.V.:1947.5; Blood:616] Out: -  Intake/Output this shift: Total I/O In: 240 [P.O.:240] Out: -   Lab Results: Recent Labs    07/11/23 0520 07/11/23 1337 07/12/23 0501 07/12/23 2102 07/13/23 0442  WBC 10.2  --  10.0  --  10.0  HGB 7.3*   < > 6.9* 8.1* 7.9*  HCT 23.8*   < > 22.2* 25.9* 25.2*  PLT 227  --  248  --  271   < > = values in this interval not displayed.   BMET Recent Labs    07/11/23 0520 07/12/23 0501 07/13/23 0442  NA 138 138 139  K 3.7 3.8 3.5  CL 110 112* 107  CO2 23 23 23   GLUCOSE 100* 118* 130*  BUN 9 7* 7*  CREATININE 0.73 0.71 0.78  CALCIUM 8.2* 7.9* 8.5*   LFT No results for input(s): "PROT", "ALBUMIN", "AST", "ALT", "ALKPHOS", "BILITOT", "BILIDIR", "IBILI" in the last 72  hours. PT/INR Recent Labs    07/11/23 0520  LABPROT 14.0  INR 1.1         Assessment / Plan:    #69 62 year old African-American female with history of recurrent diverticular hemorrhage, admitted with recurrent hematochezia. CTA done twice this admission negative for active bleeding  Hemoglobin stable since transfusion yesterday, patient did have 1 large black stool this morning.  Suspect this is passage of old blood that had been sitting in the colon, will continue to monitor hemoglobin  Last colonoscopy 2022, 2 polyps removed and noted multiple medium sized diverticuli throughout the colon EGD 2022 few sessile nonbleeding polyps in the gastric fundus otherwise normal stomach and normal to the second portion of the duodenum. Capsule endoscopy-1 small area of erosion versus erythema, at 1 hour mark, nonbleeding, no AVMs noted  #2 anemia acute secondary to GI blood loss #3 hypertension #4.  Adult onset diabetes mellitus #5 Cushing's disease status post right adrenalectomy  Plan; continue observation today Continue to trend hemoglobin-if hemoglobin stable over the next 24 hours she will be able to be discharged.    Principal Problem:   Diverticulosis of intestine with bleeding Active Problems:   Diabetes mellitus (HCC)   Essential hypertension   Hyperlipidemia with target LDL less than 100   GI bleed   Pancolonic diverticulosis  Internal hemorrhoids     LOS: 4 days   Amy EsterwoodPA-C  07/13/2023, 11:03 AM  GI ATTENDING  Interval history and data reviewed.  Patient seen and examined.  Agree with interval progress note as outlined above.  Seems to have had a self-limited diverticular bleeding.  Agree with another day of observation.  Advance diet.  Ambulate.  Monitor blood counts and stools.  Wilhemina Bonito. Eda Keys., M.D. [facility] Division of Gastroenterology

## 2023-07-13 NOTE — Progress Notes (Signed)
Mobility Specialist - Progress Note   07/13/23 0933  Mobility  Activity Ambulated with assistance in hallway  Level of Assistance Modified independent, requires aide device or extra time  Assistive Device Other (Comment) (IV Pole)  Distance Ambulated (ft) 500 ft  Activity Response Tolerated well  Mobility Referral Yes  $Mobility charge 1 Mobility  Mobility Specialist Start Time (ACUTE ONLY) 0920  Mobility Specialist Stop Time (ACUTE ONLY) 0931  Mobility Specialist Time Calculation (min) (ACUTE ONLY) 11 min   Pt received in bed and agreeable to mobility. C/o feeling dizzy throughout session but still eager to ambulate. Pt to bed after session with all needs met.     [facility]

## 2023-07-13 NOTE — Progress Notes (Signed)
[facility] Progress Note  Patient: Carol Mueller    ZOX:[id number]  DOA: 07/07/2023     Date of Service: the patient was seen and examined on 07/13/2023  Chief Complaint  Patient presents with   Melena   Brief hospital course: This is a 62 year old female with past medical history of pandiverticulosis, recurrent diverticular bleeding, type 2 diabetes mellitus and hypertension.  Patient was readmitted with diverticular bleed.  Patient reports dark-colored stool overnight.  No bright red blood per rectum.  Hemoglobin is stable.  GI team advises observing patient overnight.  Possible discharge tomorrow if patient remains stable.    Assessment and Plan:  # Recurrent diverticular bleeding: -Patient has pan-diverticulosis  -Pt has h/o internal hemorrhoids and adenomatous polyps -No active bleeding overnight. -Continue supportive care. -Continue to monitor H/H. -Patient has been transfused several units of packed red blood cells.  I -IV Protonix twice daily ordered given darkened stool, progressively worsening GERD, epigastric tenderness.  Please have a very low threshold to discontinue IV Protonix. -GI is directing care. -CT angio abdomen and pelvis not reveal any new finding.  Extensive colonic diverticulosis without acute diverticulitis noted -Hemoglobin is 7.9 to 8.1 g/dL today. -Repeat CBC in the morning.  # Essential hypertension -Coreg and Benicar resumed.   Monitor BP and titrate medications accordingly 07/13/2023: Blood pressure is controlled.  # T2DM -Sliding scale insulin only. -Hold 7030 for now.     # Cushing's disease, h/o R adrenalectomy   Body mass index is 41.27 kg/m.  Interventions:  DVT Prophylaxis: SCD, pharmacological prophylaxis contraindicated due to GI bleed    Advance goals of care discussion: Full code  Family Communication:   Disposition:  Possible discharge back home tomorrow  Subjective:  -No bright red blood per rectum today. -Dark  blood per rectum reported overnight (likely old bleed).  Physical Exam: General: Not in any distress.  Awake and alert.  Exophthalmos is noted. HEENT: Patient is pale.  Exophthalmos is noted.  Neck: no JVD, supple. Cardiovascular: S1 and S2. Respiratory: Clear to auscultation. Abdomen: Obese, soft and nontender. Extremities: No leg edema.   Neurologic: Patient is awake and alert.  Patient moves all extremities.  Vitals:   07/12/23 1327 07/12/23 2106 07/13/23 0556 07/13/23 1416  BP: 123/70 132/71 120/73 139/75  Pulse: 82 83 80 80  Resp: 16 18 18 16   Temp: 98.1 F (36.7 C) 98.4 F (36.9 C) 98.2 F (36.8 C) 99 F (37.2 C)  TempSrc: Oral Oral Oral Oral  SpO2: 100% 100% 99% 99%  Weight:      Height:        Intake/Output Summary (Last 24 hours) at 07/13/2023 1717 Last data filed at 07/13/2023 1500 Gross per 24 hour  Intake 3908.09 ml  Output --  Net 3908.09 ml   Filed Weights   07/07/23 1508  Weight: 105.7 kg     CBC: Recent Labs  Lab 07/09/23 1203 07/10/23 0514 07/10/23 1905 07/11/23 0520 07/11/23 1337 07/11/23 2248 07/12/23 0501 07/12/23 2102 07/13/23 0442 07/13/23 1258  WBC 11.6* 10.5  --  10.2  --   --  10.0  --  10.0  --   HGB 8.7* 6.7*   < > 7.3*   < > 7.1* 6.9* 8.1* 7.9* 8.1*  HCT 28.5* 22.2*   < > 23.8*   < > 23.2* 22.2* 25.9* 25.2* 25.5*  MCV 73.3* 72.5*  --  74.8*  --   --  74.7*  --  76.1*  --  PLT 261 223  --  227  --   --  248  --  271  --    < > = values in this interval not displayed.   Basic Metabolic Panel: Recent Labs  Lab 07/09/23 0420 07/10/23 0514 07/11/23 0520 07/12/23 0501 07/13/23 0442  NA 139 138 138 138 139  K 4.3 4.3 3.7 3.8 3.5  CL 108 110 110 112* 107  CO2 24 22 23 23 23   GLUCOSE 93 89 100* 118* 130*  BUN 18 14 9  7* 7*  CREATININE 0.89 0.77 0.73 0.71 0.78  CALCIUM 8.4* 7.9* 8.2* 7.9* 8.5*  MG 1.8 1.8 1.7  --   --   PHOS 3.0 2.4* 2.7  --   --     Studies: No results found.  Scheduled Meds:  carvedilol  25 mg Oral  BID   insulin aspart  0-15 Units Subcutaneous Q4H   insulin aspart protamine- aspart  15 Units Subcutaneous Q supper   insulin aspart protamine- aspart  5 Units Subcutaneous Q breakfast   irbesartan  75 mg Oral Daily   pantoprazole (PROTONIX) IV  40 mg Intravenous Q12H   saccharomyces boulardii  250 mg Oral BID   Continuous Infusions:  0.9 % NaCl with KCl 20 mEq / L 125 mL/hr at 07/13/23 1457   PRN Meds: acetaminophen **OR** acetaminophen, dicyclomine, methocarbamol, morphine injection, ondansetron **OR** ondansetron (ZOFRAN) IV, oxyCODONE  Time spent: 35 minutes  Author: Berton Mount, MD [facility] 07/13/2023 5:17 PM  To reach On-call, see care teams to locate the attending and reach out to them via [email or web address]. If 7PM-7AM, please contact night-coverage If you still have difficulty reaching the attending provider, please page the [facility] (Director on Call) for [facility] on amion for assistance.

## 2023-07-14 ENCOUNTER — Other Ambulatory Visit: Payer: Self-pay

## 2023-07-14 DIAGNOSIS — K625 Hemorrhage of anus and rectum: Secondary | ICD-10-CM

## 2023-07-14 DIAGNOSIS — K5731 Diverticulosis of large intestine without perforation or abscess with bleeding: Secondary | ICD-10-CM | POA: Diagnosis not present

## 2023-07-14 DIAGNOSIS — E119 Type 2 diabetes mellitus without complications: Secondary | ICD-10-CM | POA: Diagnosis not present

## 2023-07-14 DIAGNOSIS — I10 Essential (primary) hypertension: Secondary | ICD-10-CM | POA: Diagnosis not present

## 2023-07-14 DIAGNOSIS — D62 Acute posthemorrhagic anemia: Secondary | ICD-10-CM | POA: Diagnosis not present

## 2023-07-14 LAB — GLUCOSE, CAPILLARY
Glucose-Capillary: 124 mg/dL — ABNORMAL HIGH (ref 70–99)
Glucose-Capillary: 111 mg/dL — ABNORMAL HIGH (ref 70–99)
Glucose-Capillary: 100 mg/dL — ABNORMAL HIGH (ref 70–99)
Glucose-Capillary: 147 mg/dL — ABNORMAL HIGH (ref 70–99)
Glucose-Capillary: 174 mg/dL — ABNORMAL HIGH (ref 70–99)
Glucose-Capillary: 103 mg/dL — ABNORMAL HIGH (ref 70–99)
Glucose-Capillary: 115 mg/dL — ABNORMAL HIGH (ref 70–99)

## 2023-07-14 LAB — CBC WITH DIFFERENTIAL/PLATELET
Abs Immature Granulocytes: 0.09 10*3/uL — ABNORMAL HIGH (ref 0.00–0.07)
Basophils Absolute: 0.1 10*3/uL (ref 0.0–0.1)
Basophils Relative: 1 %
Eosinophils Absolute: 0.4 10*3/uL (ref 0.0–0.5)
Eosinophils Relative: 5 %
HCT: 24.2 % — ABNORMAL LOW (ref 36.0–46.0)
Hemoglobin: 7.5 g/dL — ABNORMAL LOW (ref 12.0–15.0)
Immature Granulocytes: 1 %
Lymphocytes Relative: 30 %
Lymphs Abs: 2.8 10*3/uL (ref 0.7–4.0)
MCH: 23.6 pg — ABNORMAL LOW (ref 26.0–34.0)
MCHC: 31 g/dL (ref 30.0–36.0)
MCV: 76.1 fL — ABNORMAL LOW (ref 80.0–100.0)
Monocytes Absolute: 0.7 10*3/uL (ref 0.1–1.0)
Monocytes Relative: 7 %
Neutro Abs: 5.4 10*3/uL (ref 1.7–7.7)
Neutrophils Relative %: 56 %
Platelets: 279 10*3/uL (ref 150–400)
RBC: 3.18 MIL/uL — ABNORMAL LOW (ref 3.87–5.11)
RDW: 21.1 % — ABNORMAL HIGH (ref 11.5–15.5)
WBC: 9.4 10*3/uL (ref 4.0–10.5)
nRBC: 0.2 % (ref 0.0–0.2)

## 2023-07-14 LAB — PREPARE RBC (CROSSMATCH)

## 2023-07-14 LAB — HEMOGLOBIN AND HEMATOCRIT, BLOOD
HCT: 27.9 % — ABNORMAL LOW (ref 36.0–46.0)
Hemoglobin: 8.6 g/dL — ABNORMAL LOW (ref 12.0–15.0)

## 2023-07-14 MED ORDER — SODIUM CHLORIDE 0.9% IV SOLUTION: Freq: Once | INTRAVENOUS | Status: AC

## 2023-07-14 NOTE — Hospital Course (Signed)
Carol Mueller is a 62 y.o. female with a history of pan-diverticulosis, recurrent diverticular bleeding, diabetes mellitus type 2, hypertension .  Patient presented secondary to bright red blood per rectum with concern for recurrent diverticular bleeding. GI consulted. Evaluation was negative for identification of source. Patient required a total of 3 units of PRBC.

## 2023-07-14 NOTE — Progress Notes (Signed)
I called the lab to see if the blood was ready for this patient. The lab tech advised me that we have about 10 more minutes to wait and she will call me when ready.

## 2023-07-14 NOTE — Progress Notes (Signed)
Per MD note on 07/13/2023 at 1717 and 1729 pm -Sliding scale insulin only.-Hold 7030 for now.

## 2023-07-14 NOTE — Progress Notes (Addendum)
Patient ID: Carol Mueller, female   DOB: Jul 06, 1961, 62 y.o.   MRN: [id number]    Progress Note   Subjective   Day # 7 CC;acute GI bleed / recurrent  Hemoglobin 8.1 posttransfusion> 7.7> 7.5  No further stools yesterday, just had a small bowel movement this morning, black and Fouty Patient says she feels okay but a little bit "swimmy headed" when up and around, still says her stomach bothers her a little bit after eating   Objective   Vital signs in last 24 hours: Temp:  [98.3 F (36.8 C)-99 F (37.2 C)] 98.5 F (36.9 C) (08/28 0522) Pulse Rate:  [80-96] 81 (08/28 0522) Resp:  [16-19] 18 (08/28 0522) BP: (133-144)/(74-94) 144/94 (08/28 0522) SpO2:  [96 %-99 %] 96 % (08/28 0522) Last BM Date : 07/13/23 General:   Older African-American female in NAD Heart:  Regular rate and rhythm; no murmurs Lungs: Respirations even and unlabored, lungs CTA bilaterally Abdomen:  Soft, minimally tender in the left upper quadrant nondistended. Normal bowel sounds. Extremities:  Without edema. Neurologic:  Alert and oriented,  grossly normal neurologically. Psych:  Cooperative. Normal mood and affect.  Intake/Output from previous day: 08/27 0701 - 08/28 0700 In: 4227.6 [P.O.:1440; I.V.:2787.6] Out: -  Intake/Output this shift: Total I/O In: 120 [P.O.:120] Out: -   Lab Results: Recent Labs    07/12/23 0501 07/12/23 2102 07/13/23 0442 07/13/23 1258 07/13/23 2159 07/14/23 0443  WBC 10.0  --  10.0  --   --  9.4  HGB 6.9*   < > 7.9* 8.1* 7.7* 7.5*  HCT 22.2*   < > 25.2* 25.5* 24.8* 24.2*  PLT 248  --  271  --   --  279   < > = values in this interval not displayed.   BMET Recent Labs    07/12/23 0501 07/13/23 0442  NA 138 139  K 3.8 3.5  CL 112* 107  CO2 23 23  GLUCOSE 118* 130*  BUN 7* 7*  CREATININE 0.71 0.78  CALCIUM 7.9* 8.5*   LFT No results for input(s): "PROT", "ALBUMIN", "AST", "ALT", "ALKPHOS", "BILITOT", "BILIDIR", "IBILI" in the last 72 hours. PT/INR No  results for input(s): "LABPROT", "INR" in the last 72 hours.       Assessment / Plan:    #40.  62 year old African-American female history of recurrent diverticular hemorrhage admitted with recurrent hematochezia and anemia. She underwent CTA times twice this admission both negative for active bleeding.  She has had a slow somewhat stuttering bleed still suspect secondary to diverticular hemorrhage. This finally seems to be resolved /resolving-1 small stool this a.m. black and Aeschliman she is encouraging Hemoglobin has drifted a bit but I suspect most of this is equilibration  Is symptomatic from the anemia with some lightheadedness with ambulation etc.  #2.  Anemia acute secondary to GI blood loss-as above #3.  Adult onset diabetes mellitus #4.  Cushing's disease status post right adrenalectomy  Plan: 1.  Will transfuse 1 unit of packed RBCs today, if does okay for the remainder of the day I think she can be discharged home this afternoon or tomorrow morning 2.  Would like her to take Pepcid 20 mg p.o. twice daily over the next 6 to 8 weeks 3.  No aspirin or NSAIDs 4.  Will arrange for her to have labs done at our office early next week on Tuesday and will continue to follow her hemoglobin to assure stabilization/normalization  Will sign off.  Feel free  to reach out for questions or problems.  Principal Problem:   Diverticulosis of intestine with bleeding Active Problems:   Diabetes mellitus (HCC)   Essential hypertension   Hyperlipidemia with target LDL less than 100   GI bleed   Pancolonic diverticulosis   Internal hemorrhoids     LOS: 5 days   Amy EsterwoodPA-C  07/14/2023, 8:48 AM  GI ATTENDING  Interval history data reviewed.  Patient seen and examined.  Agree with interval progress note as outlined above.  Agree with impressions and plans without additions or deletions.  Please review above.  We are available for questions or problems.  Will sign off.  Wilhemina Bonito. Eda Keys., M.D. [facility] Division of Gastroenterology

## 2023-07-14 NOTE — Progress Notes (Signed)
PROGRESS NOTE    Carol Mueller  BMW:[id number] DOB: May 27, 1961 DOA: 07/07/2023 PCP: [name], DO   Brief Narrative: Carol Mueller is a 62 y.o. female with a history of pan-diverticulosis, recurrent diverticular bleeding, diabetes mellitus type 2, hypertension .  Patient presented secondary to bright red blood per rectum with concern for recurrent diverticular bleeding. GI consulted. Evaluation was negative for identification of source. Patient required a total of 3 units of PRBC.   Assessment and Plan:  Diverticulosis with recurrent diverticular bleeding [facility] consulted. Attempted idenfication of GI bleed via CTA were negative for active bleeding. Bleeding slowed on its own. GI signed off with recommendation for follow-up labs at their office on Tuesday, 07/20/2023  Acute blood loss anemia Secondary to diverticular bleed. Hemoglobin of 13 on admission with sharp decrease to as low as 6.7. Patient has required a total of 3 units of PRBC via blood transfusion.  Primary hypertension Patient is on Coreg and olmesartan as an outpatient. -Continue Coreg -Continue irbesartan (substituted for home olmesartan)  Diabetes mellitus type 2 Well-controlled with hemoglobin A1c of 6.9%. patient is on Novolog 70/30, metformin, semaglutide and Jardiance as an outpatient. -Continue Novolog 70/30 5 qAM and 15 units qPM  Cushing's disease Patient with a history of right adrenalectomy.  Morbid obesity Estimated body mass index is 41.27 kg/m as calculated from the following:   Height as of this encounter: 5\' 3"  (1.6 m).   Weight as of this encounter: 105.7 kg.  DVT prophylaxis: SCDs Code Status:   Code Status: Full Code Family Communication: None at bedside Disposition Plan: Discharge home pending stable hemoglobin.  Anticipate discharge home tomorrow   Consultants:  [facility]  Procedures:  None  Antimicrobials: None   Subjective: Patient reports having a  bowel movement this morning that was mixed Howk and black.  No hematochezia noted.  Objective: BP (!) 153/83 (BP Location: Left Arm) Comment: notify to the nurse.  Pulse 77   Temp 98.7 F (37.1 C) (Oral)   Resp 16   Ht 5\' 3"  (1.6 m)   Wt 105.7 kg   SpO2 100%   BMI 41.27 kg/m   Examination:  General exam: Appears calm and comfortable Respiratory system: Clear to auscultation. Respiratory effort normal. Cardiovascular system: S1 & S2 heard, RRR. Gastrointestinal system: Abdomen is nondistended, soft and nontender. Normal bowel sounds heard. Central nervous system: Alert and oriented. No focal neurological deficits. Musculoskeletal: No edema. No calf tenderness Skin: No cyanosis. No rashes Psychiatry: Judgement and insight appear normal. Mood & affect appropriate.    Data Reviewed: I have personally reviewed following labs and imaging studies  CBC Lab Results  Component Value Date   WBC 9.4 07/14/2023   RBC 3.18 (L) 07/14/2023   HGB 7.5 (L) 07/14/2023   HCT 24.2 (L) 07/14/2023   MCV 76.1 (L) 07/14/2023   MCH 23.6 (L) 07/14/2023   PLT 279 07/14/2023   MCHC 31.0 07/14/2023   RDW 21.1 (H) 07/14/2023   LYMPHSABS 2.8 07/14/2023   MONOABS 0.7 07/14/2023   EOSABS 0.4 07/14/2023   BASOSABS 0.1 07/14/2023     Last metabolic panel Lab Results  Component Value Date   NA 139 07/13/2023   K 3.5 07/13/2023   CL 107 07/13/2023   CO2 23 07/13/2023   BUN 7 (L) 07/13/2023   CREATININE 0.78 07/13/2023   GLUCOSE 130 (H) 07/13/2023   GFRNONAA >60 07/13/2023   GFRAA >60 03/01/2020   CALCIUM 8.5 (L) 07/13/2023   PHOS  2.7 07/11/2023   PROT 8.0 07/07/2023   ALBUMIN 4.0 07/07/2023   BILITOT 0.5 07/07/2023   ALKPHOS 54 07/07/2023   AST 16 07/07/2023   ALT 15 07/07/2023   ANIONGAP 9 07/13/2023    GFR: Estimated Creatinine Clearance: 83.7 mL/min (by C-G formula based on SCr of 0.78 mg/dL).  No results found for this or any previous visit (from the past 240 hour(s)).     Radiology Studies: No results found.    LOS: 5 days    Jacquelin Hawking, MD [facility] 07/14/2023, 6:39 PM   If 7PM-7AM, please contact night-coverage [email or web address]

## 2023-07-14 NOTE — Progress Notes (Signed)
I completed pre blood admin vitals. Sent NT to get the blood from the blood bank.

## 2023-07-14 NOTE — Progress Notes (Signed)
Spoke with Dr. Caleb Popp on the phone and he asked if the patient had an H&H. I advised him that it is set for 1930. He std that he had a conversation with the patient earlier about going home. He wanted to know if she was ok with staying overnight since it will be late getting the H&H done. I spoke with the patient, she is ok with it.

## 2023-07-14 NOTE — Progress Notes (Signed)
I received a call from the blood bank. Patient's blood is now ready.

## 2023-07-14 NOTE — Progress Notes (Signed)
Mobility Specialist - Progress Note   07/14/23 1028  Mobility  Activity Ambulated with assistance in hallway  Level of Assistance Independent after set-up  Assistive Device Other (Comment) (IV Pole)  Distance Ambulated (ft) 500 ft  Activity Response Tolerated well  Mobility Referral Yes  $Mobility charge 1 Mobility  Mobility Specialist Start Time (ACUTE ONLY) 1012  Mobility Specialist Stop Time (ACUTE ONLY) 1028  Mobility Specialist Time Calculation (min) (ACUTE ONLY) 16 min   Pt received in recliner and agreeable to mobility. C/o SOB during ambulation. SpO2 checked & 100%. No other complaints during session. Pt to recliner after session with all needs met.    [facility]

## 2023-07-14 NOTE — Progress Notes (Signed)
   07/14/23 2106  BiPAP/CPAP/SIPAP  BiPAP/CPAP/SIPAP Pt Type Adult (Patient prefers self placement when ready for bed.  Machine is plugged in and ready for patient use.)  BiPAP/CPAP/SIPAP DREAMSTATIOND  Mask Type Nasal mask  Mask Size Medium  FiO2 (%) 21 %  Patient Home Equipment No  Auto Titrate Yes  CPAP/SIPAP surface wiped down Yes

## 2023-07-15 DIAGNOSIS — K5731 Diverticulosis of large intestine without perforation or abscess with bleeding: Secondary | ICD-10-CM | POA: Diagnosis not present

## 2023-07-15 LAB — CBC
HCT: 25.8 % — ABNORMAL LOW (ref 36.0–46.0)
Hemoglobin: 8.2 g/dL — ABNORMAL LOW (ref 12.0–15.0)
MCH: 24.2 pg — ABNORMAL LOW (ref 26.0–34.0)
MCHC: 31.8 g/dL (ref 30.0–36.0)
MCV: 76.1 fL — ABNORMAL LOW (ref 80.0–100.0)
Platelets: 307 10*3/uL (ref 150–400)
RBC: 3.39 MIL/uL — ABNORMAL LOW (ref 3.87–5.11)
RDW: 21 % — ABNORMAL HIGH (ref 11.5–15.5)
WBC: 9.9 10*3/uL (ref 4.0–10.5)
nRBC: 0.2 % (ref 0.0–0.2)

## 2023-07-15 LAB — GLUCOSE, CAPILLARY
Glucose-Capillary: 144 mg/dL — ABNORMAL HIGH (ref 70–99)
Glucose-Capillary: 127 mg/dL — ABNORMAL HIGH (ref 70–99)
Glucose-Capillary: 116 mg/dL — ABNORMAL HIGH (ref 70–99)

## 2023-07-15 LAB — CALCIUM: Calcium: 8.6 mg/dL — ABNORMAL LOW (ref 8.9–10.3)

## 2023-07-15 LAB — TYPE AND SCREEN
ABO/RH(D): O POS
Antibody Screen: NEGATIVE
Unit division: 0

## 2023-07-15 LAB — BPAM RBC
Blood Product Expiration Date: 202409062359
ISSUE DATE / TIME: 202408281355
Unit Type and Rh: 5100

## 2023-07-15 LAB — POTASSIUM: Potassium: 3.3 mmol/L — ABNORMAL LOW (ref 3.5–5.1)

## 2023-07-15 LAB — MAGNESIUM: Magnesium: 1.7 mg/dL (ref 1.7–2.4)

## 2023-07-15 MED ORDER — CALCIUM CARBONATE ANTACID 500 MG PO CHEW
1.0000 | CHEWABLE_TABLET | Freq: Three times a day (TID) | ORAL | Status: DC
  Administered 2023-07-15: 200 mg via ORAL
  Filled 2023-07-15: qty 1

## 2023-07-15 MED ORDER — POTASSIUM CHLORIDE CRYS ER 20 MEQ PO TBCR
40.0000 meq | EXTENDED_RELEASE_TABLET | ORAL | Status: AC
  Administered 2023-07-15 (×2): 40 meq via ORAL
  Filled 2023-07-15 (×2): qty 2

## 2023-07-15 NOTE — Progress Notes (Signed)
Patient discharged home, IV removed, discharge paperwork provided and explained, patient verbalized understanding.

## 2023-07-15 NOTE — Discharge Summary (Signed)
Physician Discharge Summary   Patient: Carol Mueller MRN: [id number] DOB: 18-May-1961  Admit date:     07/07/2023  Discharge date: 07/15/2023  Discharge Physician: Jacquelin Hawking, MD   PCP: [name], DO   Recommendations at discharge:  PCP follow-up Repeat CBC at GI office on 9/3  Discharge Diagnoses: Principal Problem:   Diverticulosis of intestine with bleeding Active Problems:   Diabetes mellitus (HCC)   Essential hypertension   Hyperlipidemia with target LDL less than 100   GI bleed   Pancolonic diverticulosis   Internal hemorrhoids  Resolved Problems:   * No resolved hospital problems. *  Hospital Course: Carol Mueller is a 62 y.o. female with a history of pan-diverticulosis, recurrent diverticular bleeding, diabetes mellitus type 2, hypertension .  Patient presented secondary to bright red blood per rectum with concern for recurrent diverticular bleeding. GI consulted. Evaluation was negative for identification of source. Patient required a total of 3 units of PRBC. Hemoglobin stable on day of discharge.  Assessment and Plan:  Diverticulosis with recurrent diverticular bleeding [facility] consulted. Attempted idenfication of GI bleed via CTA were negative for active bleeding. Bleeding slowed on its own. GI signed off with recommendation for follow-up labs at their office on Tuesday, 07/20/2023   Acute blood loss anemia Secondary to diverticular bleed. Hemoglobin of 13 on admission with sharp decrease to as low as 6.7. Patient has required a total of 3 units of PRBC via blood transfusion.   Primary hypertension Patient is on Coreg and olmesartan as an outpatient. Continue on discharge.  Diabetes mellitus type 2 Well-controlled with hemoglobin A1c of 6.9%. patient is on Novolog 70/30, metformin, semaglutide and Jardiance as an outpatient. Continue on discharge.   Cushing's disease Patient with a history of right adrenalectomy.   Morbid obesity Estimated body mass  index is 41.27 kg/m as calculated from the following:   Height as of this encounter: 5\' 3"  (1.6 m).   Weight as of this encounter: 105.7 kg.   Consultants: [facility] Procedures performed: None  Disposition: Home Diet recommendation: Carb modified diet   DISCHARGE MEDICATION: Allergies as of 07/15/2023       Reactions   Norvasc [amlodipine Besylate] Swelling   ANGIOEDEMA   Nsaids Other (See Comments)   GI upset/ GI bleed   Sulfa Antibiotics Other (See Comments)   childhood        Medication List     TAKE these medications    acetaminophen 325 MG tablet Commonly known as: TYLENOL Take 2 tablets (650 mg total) by mouth every 6 (six) hours as needed for mild pain (or Fever >/= 101). What changed: when to take this   atorvastatin 10 MG tablet Commonly known as: LIPITOR Take 10 mg by mouth daily.   carvedilol 25 MG tablet Commonly known as: COREG Take 25 mg by mouth 2 (two) times daily.   Jardiance 10 MG Tabs tablet Generic drug: empagliflozin Take 10 mg by mouth daily.   metformin 500 MG (OSM) 24 hr tablet Commonly known as: FORTAMET Take 500 mg by mouth 2 (two) times daily with a meal.   methocarbamol 500 MG tablet Commonly known as: ROBAXIN Take 500 mg by mouth as needed for muscle spasms.   multivitamin with minerals tablet Take 1 tablet by mouth daily.   NovoLOG Mix 70/30 FlexPen (70-30) 100 UNIT/ML FlexPen Generic drug: insulin aspart protamine - aspart Inject 10-30 Units into the skin See admin instructions. 10 units in the morning, and 30 at  bedtime   olmesartan 40 MG tablet Commonly known as: BENICAR Take 40 mg by mouth daily.   Ozempic (0.25 or 0.5 MG/DOSE) 2 MG/1.5ML Sopn Generic drug: Semaglutide(0.25 or 0.5MG /DOS) Inject 2 mg into the skin once a week.        Follow-up Information     [name], DO. Schedule an appointment as soon as possible for a visit in 1 week(s).   Specialty: Family Medicine Why: For hospital  follow-up Contact information: [address] [place] [address] [place] [address] [phone]         Meredith Pel, NP. Schedule an appointment as soon as possible for a visit on 07/20/2023.   Specialty: Gastroenterology Why: For hospital follow-up. Repeat labs. Contact information: [address] [place] [address] [phone]                Discharge Exam: BP (!) 146/88 (BP Location: Left Arm)   Pulse 82   Temp 98.7 F (37.1 C) (Oral)   Resp 18   Ht 5\' 3"  (1.6 m)   Wt 105.7 kg   SpO2 96%   BMI 41.27 kg/m   General exam: Appears calm and comfortable Respiratory system: Respiratory effort normal. Central nervous system: Alert and oriented. Psychiatry: Judgement and insight appear normal. Mood & affect appropriate.   Condition at discharge: stable  The results of significant diagnostics from this hospitalization (including imaging, microbiology, ancillary and laboratory) are listed below for reference.   Imaging Studies: CT ANGIO GI BLEED  Result Date: 07/09/2023 CLINICAL DATA:  Rectal bleeding and generalized abdominal pain for several days. EXAM: CTA ABDOMEN AND PELVIS WITHOUT AND WITH CONTRAST TECHNIQUE: Multidetector CT imaging of the abdomen and pelvis was performed using the standard protocol during bolus administration of intravenous contrast. Multiplanar reconstructed images and MIPs were obtained and reviewed to evaluate the vascular anatomy. RADIATION DOSE REDUCTION: This exam was performed according to the departmental dose-optimization program which includes automated exposure control, adjustment of the mA and/or kV according to patient size and/or use of iterative reconstruction technique. CONTRAST:  OMNIPAQUE IOHEXOL 350 MG/ML SOLN COMPARISON:  July 07, 2023. FINDINGS: VASCULAR Aorta: Normal caliber aorta without aneurysm, dissection, vasculitis or significant stenosis. Celiac: Patent without evidence of aneurysm, dissection, vasculitis  or significant stenosis. SMA: Patent without evidence of aneurysm, dissection, vasculitis or significant stenosis. Renals: Both renal arteries are patent without evidence of aneurysm, dissection, vasculitis, fibromuscular dysplasia or significant stenosis. IMA: Patent without evidence of aneurysm, dissection, vasculitis or significant stenosis. Inflow: Patent without evidence of aneurysm, dissection, vasculitis or significant stenosis. Proximal Outflow: Bilateral common femoral and visualized portions of the superficial and profunda femoral arteries are patent without evidence of aneurysm, dissection, vasculitis or significant stenosis. Veins: No obvious venous abnormality within the limitations of this arterial phase study. Review of the MIP images confirms the above findings. NON-VASCULAR Lower chest: No acute abnormality. Hepatobiliary: No focal liver abnormality is seen. Status post cholecystectomy. No biliary dilatation. Pancreas: Unremarkable. No pancreatic ductal dilatation or surrounding inflammatory changes. Spleen: Normal in size without focal abnormality. Adrenals/Urinary Tract: Status post right adrenalectomy. Left adrenal gland is unremarkable. Stable bilateral renal cysts are noted for which no further follow-up is required. No hydronephrosis or renal obstruction is noted. Urinary bladder is unremarkable. Stomach/Bowel: The stomach is unremarkable. There is no evidence of bowel obstruction or inflammation. The appendix appears normal. Diverticulosis is noted throughout the colon. No definite evidence of gastrointestinal bleeding is noted. Lymphatic: No adenopathy is noted. Reproductive: Multiple calcified  uterine fibroids are noted. No adnexal abnormality. Other: Small fat containing periumbilical hernia. No abdominopelvic ascites. Musculoskeletal: No acute or significant osseous findings. IMPRESSION: VASCULAR No definite evidence of active gastrointestinal bleeding. NON-VASCULAR Diffuse colonic  diverticulosis is noted without inflammation. Multiple calcified uterine fibroids are noted. Electronically Signed   By: Lupita Raider M.D.   On: 07/09/2023 13:58   CT Angio Abd/Pel W and/or Wo Contrast  Result Date: 07/07/2023 CLINICAL DATA:  History of GI bleed with several days of dark stools EXAM: CTA ABDOMEN AND PELVIS WITHOUT AND WITH CONTRAST TECHNIQUE: Multidetector CT imaging of the abdomen and pelvis was performed using the standard protocol during bolus administration of intravenous contrast. Multiplanar reconstructed images and MIPs were obtained and reviewed to evaluate the vascular anatomy. RADIATION DOSE REDUCTION: This exam was performed according to the departmental dose-optimization program which includes automated exposure control, adjustment of the mA and/or kV according to patient size and/or use of iterative reconstruction technique. CONTRAST:  OMNIPAQUE IOHEXOL 350 MG/ML SOLN COMPARISON:  CTA abdomen and pelvis dated 07/08/2021 FINDINGS: VASCULAR Aorta: Aortic atherosclerosis. Normal caliber aorta without aneurysm, dissection, vasculitis or significant stenosis. Celiac: Patent without evidence of aneurysm, dissection, vasculitis or significant stenosis. SMA: Patent without evidence of aneurysm, dissection, vasculitis or significant stenosis. Renals: Both renal arteries are patent without evidence of aneurysm, dissection, vasculitis, fibromuscular dysplasia or significant stenosis. IMA: Patent without evidence of aneurysm, dissection, vasculitis or significant stenosis. Inflow: Patent without evidence of aneurysm, dissection, vasculitis or significant stenosis. Proximal Outflow: Bilateral common femoral and visualized portions of the superficial and profunda femoral arteries are patent without evidence of aneurysm, dissection, vasculitis or significant stenosis. Veins: No obvious venous abnormality within the limitations of this arterial phase study. Review of the MIP images  confirms the above findings. NON-VASCULAR Lower chest: No focal consolidation or pulmonary nodule in the lung bases. No pleural effusion or pneumothorax demonstrated. Partially imaged heart size is normal. Hepatobiliary: No focal hepatic lesions. No intra or extrahepatic biliary ductal dilation. Cholecystectomy. Pancreas: No focal lesions or main ductal dilation. Spleen: Normal in size without focal abnormality. 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 No left adrenal nodules. No hydronephrosis or calculi. Bilateral simple cysts. Additional hypoattenuating foci do not demonstrate appreciable enhancement, likely hemorrhagic/proteinaceous cyst. No specific follow-up imaging recommended. No focal bladder wall thickening. Stomach/Bowel: Normal appearance of the stomach. No evidence of bowel wall thickening, distention, or inflammatory changes. Extensive colonic diverticulosis without acute diverticulitis. Normal appendix. Lymphatic: No enlarged abdominal or pelvic lymph nodes. Reproductive: Multifocal exophytic masses arising from the uterus with coarse heterogeneous calcifications, likely leiomyomas. No adnexal masses. Other: No free fluid, fluid collection, or free air. Musculoskeletal: No acute or abnormal lytic or blastic osseous lesions. Multilevel degenerative changes of the partially imaged thoracic and lumbar spine. Small fat-containing right inguinal hernia. IMPRESSION: 1. No evidence of active GI bleed. 2. Extensive colonic diverticulosis without acute diverticulitis. 3.  Aortic Atherosclerosis (ICD10-I70.0). Electronically Signed   By: Agustin Cree M.D.   On: 07/07/2023 18:34    Microbiology: Results for orders placed or performed during the hospital encounter of 07/08/21  SARS CORONAVIRUS 2 (TAT 6-24 HRS) Nasopharyngeal Nasopharyngeal Swab     Status: None   Collection Time: 07/08/21 10:27 PM   Specimen: Nasopharyngeal Swab  Result Value Ref Range Status   SARS Coronavirus  2 NEGATIVE NEGATIVE Final    Comment: (NOTE) SARS-CoV-2 target nucleic acids are NOT DETECTED.  The SARS-CoV-2 RNA is generally detectable in upper and lower respiratory  specimens during the acute phase of infection. Negative results do not preclude SARS-CoV-2 infection, do not rule out co-infections with other pathogens, and should not be used as the sole basis for treatment or other patient management decisions. Negative results must be combined with clinical observations, patient history, and epidemiological information. The expected result is Negative.  Fact Sheet for Patients: HairSlick.no  Fact Sheet for Healthcare Providers: quierodirigir.com  This test is not yet approved or cleared by the [place] FDA and  has been authorized for detection and/or diagnosis of SARS-CoV-2 by FDA under an Emergency Use Authorization (EUA). This EUA will remain  in effect (meaning this test can be used) for the duration of the COVID-19 declaration under Se ction 564(b)(1) of the Act, 21 U.S.C. section 360bbb-3(b)(1), unless the authorization is terminated or revoked sooner.  Performed at [facility], [address] [place], [place] [address]     Labs: CBC: Recent Labs  Lab 07/11/23 [phone] 07/11/23 1337 07/12/23 0501 07/12/23 2102 07/13/23 0442 07/13/23 1258 07/13/23 2159 07/14/23 0443 07/14/23 1924 07/15/23 0437  WBC 10.2  --  10.0  --  10.0  --   --  9.4  --  9.9  NEUTROABS  --   --   --   --   --   --   --  5.4  --   --   HGB 7.3*   < > 6.9*   < > 7.9* 8.1* 7.7* 7.5* 8.6* 8.2*  HCT 23.8*   < > 22.2*   < > 25.2* 25.5* 24.8* 24.2* 27.9* 25.8*  MCV 74.8*  --  74.7*  --  76.1*  --   --  76.1*  --  76.1*  PLT 227  --  248  --  271  --   --  279  --  307   < > = values in this interval not displayed.   Basic Metabolic Panel: Recent Labs  Lab 07/09/23 0420 07/10/23 0514 07/11/23 0520 07/12/23 0501  07/13/23 0442 07/15/23 0437  NA 139 138 138 138 139  --   K 4.3 4.3 3.7 3.8 3.5 3.3*  CL 108 110 110 112* 107  --   CO2 24 22 23 23 23   --   GLUCOSE 93 89 100* 118* 130*  --   BUN 18 14 9  7* 7*  --   CREATININE 0.89 0.77 0.73 0.71 0.78  --   CALCIUM 8.4* 7.9* 8.2* 7.9* 8.5* 8.6*  MG 1.8 1.8 1.7  --   --  1.7  PHOS 3.0 2.4* 2.7  --   --   --     CBG: Recent Labs  Lab 07/14/23 1754 07/14/23 2202 07/15/23 0225 07/15/23 0605 07/15/23 0936  GLUCAP 103* 115* 144* 127* 116*    Discharge time spent: 35 minutes.  Signed: Jacquelin Hawking, MD [facility] 07/15/2023

## 2023-07-15 NOTE — Discharge Instructions (Signed)
Carol Mueller,  You were in the hospital with diverticulosis. You required multiple units of blood. We could not find the bleed in order to stop it, unfortunately, but it appears to have stopped on its own. Please follow-up with the GI doctor on Tuesday, 9/3.

## 2023-07-15 NOTE — Progress Notes (Signed)
Mobility Specialist - Progress Note   07/15/23 0858  Mobility  Activity Ambulated independently in hallway  Level of Assistance Independent  Assistive Device None  Distance Ambulated (ft) 480 ft  Activity Response Tolerated well  Mobility Referral Yes  $Mobility charge 1 Mobility  Mobility Specialist Start Time (ACUTE ONLY) 0845  Mobility Specialist Stop Time (ACUTE ONLY) 0857  Mobility Specialist Time Calculation (min) (ACUTE ONLY) 12 min   Pt received in recliner and agreeable to mobility. No complaints during session. Pt to recliner after session with all needs met.    [facility]

## 2023-07-19 ENCOUNTER — Encounter (HOSPITAL_COMMUNITY): Payer: Self-pay | Admitting: *Deleted

## 2023-07-19 ENCOUNTER — Telehealth: Payer: Self-pay | Admitting: Gastroenterology

## 2023-07-19 ENCOUNTER — Other Ambulatory Visit: Payer: Self-pay

## 2023-07-19 ENCOUNTER — Inpatient Hospital Stay (HOSPITAL_COMMUNITY)
Admission: EM | Admit: 2023-07-19 | Discharge: 2023-07-22 | DRG: 378 | Disposition: A | Discharge status: Home / Self Care | Payer: No Typology Code available for payment source | Attending: Obstetrics and Gynecology | Admitting: Obstetrics and Gynecology

## 2023-07-19 ENCOUNTER — Emergency Department (HOSPITAL_COMMUNITY): Payer: No Typology Code available for payment source

## 2023-07-19 DIAGNOSIS — I10 Essential (primary) hypertension: Secondary | ICD-10-CM | POA: Diagnosis present

## 2023-07-19 DIAGNOSIS — K5731 Diverticulosis of large intestine without perforation or abscess with bleeding: Secondary | ICD-10-CM | POA: Diagnosis not present

## 2023-07-19 DIAGNOSIS — E876 Hypokalemia: Secondary | ICD-10-CM | POA: Diagnosis not present

## 2023-07-19 DIAGNOSIS — Z8041 Family history of malignant neoplasm of ovary: Secondary | ICD-10-CM

## 2023-07-19 DIAGNOSIS — D62 Acute posthemorrhagic anemia: Secondary | ICD-10-CM | POA: Diagnosis present

## 2023-07-19 DIAGNOSIS — Z833 Family history of diabetes mellitus: Secondary | ICD-10-CM

## 2023-07-19 DIAGNOSIS — E1169 Type 2 diabetes mellitus with other specified complication: Secondary | ICD-10-CM | POA: Diagnosis not present

## 2023-07-19 DIAGNOSIS — R1032 Left lower quadrant pain: Secondary | ICD-10-CM

## 2023-07-19 DIAGNOSIS — Z794 Long term (current) use of insulin: Secondary | ICD-10-CM

## 2023-07-19 DIAGNOSIS — Z888 Allergy status to other drugs, medicaments and biological substances status: Secondary | ICD-10-CM

## 2023-07-19 DIAGNOSIS — Z8249 Family history of ischemic heart disease and other diseases of the circulatory system: Secondary | ICD-10-CM

## 2023-07-19 DIAGNOSIS — Z7984 Long term (current) use of oral hypoglycemic drugs: Secondary | ICD-10-CM

## 2023-07-19 DIAGNOSIS — Z6841 Body Mass Index (BMI) 40.0 and over, adult: Secondary | ICD-10-CM

## 2023-07-19 DIAGNOSIS — E785 Hyperlipidemia, unspecified: Secondary | ICD-10-CM | POA: Diagnosis present

## 2023-07-19 DIAGNOSIS — E282 Polycystic ovarian syndrome: Secondary | ICD-10-CM | POA: Diagnosis present

## 2023-07-19 DIAGNOSIS — R102 Pelvic and perineal pain: Secondary | ICD-10-CM | POA: Diagnosis present

## 2023-07-19 DIAGNOSIS — Z90711 Acquired absence of uterus with remaining cervical stump: Secondary | ICD-10-CM

## 2023-07-19 DIAGNOSIS — K648 Other hemorrhoids: Secondary | ICD-10-CM | POA: Diagnosis present

## 2023-07-19 DIAGNOSIS — K5790 Diverticulosis of intestine, part unspecified, without perforation or abscess without bleeding: Principal | ICD-10-CM | POA: Diagnosis present

## 2023-07-19 DIAGNOSIS — K922 Gastrointestinal hemorrhage, unspecified: Secondary | ICD-10-CM | POA: Diagnosis not present

## 2023-07-19 DIAGNOSIS — D509 Iron deficiency anemia, unspecified: Secondary | ICD-10-CM | POA: Diagnosis present

## 2023-07-19 DIAGNOSIS — Z882 Allergy status to sulfonamides status: Secondary | ICD-10-CM

## 2023-07-19 DIAGNOSIS — D649 Anemia, unspecified: Secondary | ICD-10-CM | POA: Diagnosis present

## 2023-07-19 DIAGNOSIS — Z7985 Long-term (current) use of injectable non-insulin antidiabetic drugs: Secondary | ICD-10-CM

## 2023-07-19 DIAGNOSIS — K219 Gastro-esophageal reflux disease without esophagitis: Secondary | ICD-10-CM | POA: Diagnosis present

## 2023-07-19 DIAGNOSIS — R350 Frequency of micturition: Secondary | ICD-10-CM | POA: Diagnosis present

## 2023-07-19 DIAGNOSIS — Z79899 Other long term (current) drug therapy: Secondary | ICD-10-CM

## 2023-07-19 DIAGNOSIS — Z86010 Personal history of colonic polyps: Secondary | ICD-10-CM

## 2023-07-19 DIAGNOSIS — E249 Cushing's syndrome, unspecified: Secondary | ICD-10-CM | POA: Diagnosis present

## 2023-07-19 DIAGNOSIS — E1159 Type 2 diabetes mellitus with other circulatory complications: Secondary | ICD-10-CM | POA: Diagnosis present

## 2023-07-19 DIAGNOSIS — Z803 Family history of malignant neoplasm of breast: Secondary | ICD-10-CM

## 2023-07-19 DIAGNOSIS — Z9049 Acquired absence of other specified parts of digestive tract: Secondary | ICD-10-CM

## 2023-07-19 DIAGNOSIS — E860 Dehydration: Secondary | ICD-10-CM | POA: Diagnosis present

## 2023-07-19 DIAGNOSIS — E66813 Obesity, class 3: Secondary | ICD-10-CM

## 2023-07-19 LAB — URINALYSIS, ROUTINE W REFLEX MICROSCOPIC
Bilirubin Urine: NEGATIVE
Glucose, UA: NEGATIVE mg/dL
Ketones, ur: NEGATIVE mg/dL
Leukocytes,Ua: NEGATIVE
Nitrite: NEGATIVE
Protein, ur: NEGATIVE mg/dL
Specific Gravity, Urine: 1.012 (ref 1.005–1.030)
pH: 5 (ref 5.0–8.0)

## 2023-07-19 LAB — CBC WITH DIFFERENTIAL/PLATELET
Abs Immature Granulocytes: 0.03 10*3/uL (ref 0.00–0.07)
Basophils Absolute: 0 10*3/uL (ref 0.0–0.1)
Basophils Relative: 1 %
Eosinophils Absolute: 0.3 10*3/uL (ref 0.0–0.5)
Eosinophils Relative: 4 %
HCT: 30 % — ABNORMAL LOW (ref 36.0–46.0)
Hemoglobin: 9.2 g/dL — ABNORMAL LOW (ref 12.0–15.0)
Immature Granulocytes: 0 %
Lymphocytes Relative: 24 %
Lymphs Abs: 1.8 10*3/uL (ref 0.7–4.0)
MCH: 24.1 pg — ABNORMAL LOW (ref 26.0–34.0)
MCHC: 30.7 g/dL (ref 30.0–36.0)
MCV: 78.5 fL — ABNORMAL LOW (ref 80.0–100.0)
Monocytes Absolute: 0.6 10*3/uL (ref 0.1–1.0)
Monocytes Relative: 8 %
Neutro Abs: 4.7 10*3/uL (ref 1.7–7.7)
Neutrophils Relative %: 63 %
Platelets: 408 10*3/uL — ABNORMAL HIGH (ref 150–400)
RBC: 3.82 MIL/uL — ABNORMAL LOW (ref 3.87–5.11)
RDW: 20.9 % — ABNORMAL HIGH (ref 11.5–15.5)
WBC: 7.4 10*3/uL (ref 4.0–10.5)
nRBC: 0 % (ref 0.0–0.2)

## 2023-07-19 LAB — COMPREHENSIVE METABOLIC PANEL
ALT: 13 U/L (ref 0–44)
AST: 14 U/L — ABNORMAL LOW (ref 15–41)
Albumin: 3 g/dL — ABNORMAL LOW (ref 3.5–5.0)
Alkaline Phosphatase: 48 U/L (ref 38–126)
Anion gap: 8 (ref 5–15)
BUN: 6 mg/dL — ABNORMAL LOW (ref 8–23)
CO2: 26 mmol/L (ref 22–32)
Calcium: 8.3 mg/dL — ABNORMAL LOW (ref 8.9–10.3)
Chloride: 101 mmol/L (ref 98–111)
Creatinine, Ser: 0.76 mg/dL (ref 0.44–1.00)
GFR, Estimated: >60 mL/min (ref >60)
Glucose, Bld: 91 mg/dL (ref 70–99)
Potassium: 3.2 mmol/L — ABNORMAL LOW (ref 3.5–5.1)
Sodium: 135 mmol/L (ref 135–145)
Total Bilirubin: 0.8 mg/dL (ref 0.3–1.2)
Total Protein: 6.2 g/dL — ABNORMAL LOW (ref 6.5–8.1)

## 2023-07-19 LAB — PROTIME-INR
INR: 0.9 (ref 0.8–1.2)
Prothrombin Time: 12.8 s (ref 11.4–15.2)

## 2023-07-19 LAB — GLUCOSE, CAPILLARY
Glucose-Capillary: 80 mg/dL (ref 70–99)
Glucose-Capillary: 108 mg/dL — ABNORMAL HIGH (ref 70–99)

## 2023-07-19 LAB — LIPASE, BLOOD: Lipase: 26 U/L (ref 11–51)

## 2023-07-19 MED ORDER — POTASSIUM CHLORIDE CRYS ER 20 MEQ PO TBCR
40.0000 meq | EXTENDED_RELEASE_TABLET | Freq: Once | ORAL | Status: AC
  Administered 2023-07-19: 40 meq via ORAL
  Filled 2023-07-19: qty 2

## 2023-07-19 MED ORDER — BOOST / RESOURCE BREEZE PO LIQD CUSTOM
1.0000 | Freq: Three times a day (TID) | ORAL | Status: DC
  Administered 2023-07-19 – 2023-07-21 (×6): 1 via ORAL

## 2023-07-19 MED ORDER — IRBESARTAN 75 MG PO TABS
37.5000 mg | ORAL_TABLET | Freq: Every day | ORAL | Status: DC
  Administered 2023-07-20 – 2023-07-22 (×3): 37.5 mg via ORAL
  Filled 2023-07-19 (×3): qty 0.5

## 2023-07-19 MED ORDER — PANTOPRAZOLE SODIUM 40 MG PO TBEC
40.0000 mg | DELAYED_RELEASE_TABLET | Freq: Every day | ORAL | Status: DC
  Administered 2023-07-19: 40 mg via ORAL
  Filled 2023-07-19: qty 1

## 2023-07-19 MED ORDER — ATORVASTATIN CALCIUM 10 MG PO TABS
10.0000 mg | ORAL_TABLET | Freq: Every day | ORAL | Status: DC
  Administered 2023-07-19 – 2023-07-22 (×4): 10 mg via ORAL
  Filled 2023-07-19 (×4): qty 1

## 2023-07-19 MED ORDER — ONDANSETRON HCL 4 MG PO TABS: 4.0000 mg | ORAL_TABLET | Freq: Four times a day (QID) | ORAL | Status: DC | PRN

## 2023-07-19 MED ORDER — ACETAMINOPHEN 325 MG PO TABS: 650.0000 mg | ORAL_TABLET | Freq: Four times a day (QID) | ORAL | Status: DC | PRN

## 2023-07-19 MED ORDER — IOHEXOL 350 MG/ML SOLN
75.0000 mL | Freq: Once | INTRAVENOUS | Status: AC | PRN
  Administered 2023-07-19: 75 mL via INTRAVENOUS

## 2023-07-19 MED ORDER — SODIUM CHLORIDE 0.9 % IV BOLUS
1000.0000 mL | Freq: Once | INTRAVENOUS | Status: AC
  Administered 2023-07-19: 1000 mL via INTRAVENOUS

## 2023-07-19 MED ORDER — DICYCLOMINE HCL 20 MG PO TABS: 20.0000 mg | ORAL_TABLET | Freq: Two times a day (BID) | ORAL | 0 refills | Status: DC

## 2023-07-19 MED ORDER — SODIUM CHLORIDE 0.9% IV SOLUTION: Freq: Once | INTRAVENOUS | Status: AC

## 2023-07-19 MED ORDER — ADULT MULTIVITAMIN W/MINERALS CH
1.0000 | ORAL_TABLET | Freq: Every day | ORAL | Status: DC
  Administered 2023-07-20 – 2023-07-22 (×3): 1 via ORAL
  Filled 2023-07-19 (×3): qty 1

## 2023-07-19 MED ORDER — LACTATED RINGERS IV SOLN: INTRAVENOUS | Status: DC

## 2023-07-19 MED ORDER — INSULIN ASPART 100 UNIT/ML IJ SOLN
0.0000 [IU] | Freq: Three times a day (TID) | INTRAMUSCULAR | Status: DC
  Administered 2023-07-20 – 2023-07-22 (×6): 1 [IU] via SUBCUTANEOUS

## 2023-07-19 MED ORDER — ACETAMINOPHEN 650 MG RE SUPP
650.0000 mg | Freq: Four times a day (QID) | RECTAL | Status: DC | PRN
  Filled 2023-07-19: qty 1

## 2023-07-19 MED ORDER — CARVEDILOL 25 MG PO TABS
25.0000 mg | ORAL_TABLET | Freq: Two times a day (BID) | ORAL | Status: DC
  Administered 2023-07-20 – 2023-07-22 (×4): 25 mg via ORAL
  Filled 2023-07-19 (×7): qty 1

## 2023-07-19 MED ORDER — ONDANSETRON HCL 4 MG/2ML IJ SOLN
4.0000 mg | Freq: Four times a day (QID) | INTRAMUSCULAR | Status: DC | PRN
  Administered 2023-07-19: 4 mg via INTRAVENOUS
  Filled 2023-07-19: qty 2

## 2023-07-19 MED ORDER — PANTOPRAZOLE SODIUM 20 MG PO TBEC: 20.0000 mg | DELAYED_RELEASE_TABLET | Freq: Every day | ORAL | 0 refills | Status: DC

## 2023-07-19 NOTE — Assessment & Plan Note (Signed)
Hold on long acting insulin for now, while patient on clear liquids. Add insulin sliding scale for glucose cover and monitoring. Hold on metformin for now.   Continue statin therapy.

## 2023-07-19 NOTE — Assessment & Plan Note (Signed)
Add Kcl 40 meq x2 and follow up on Mg. Follow up renal function and electrolytes in am.  Continue hydration with balanced electrolyte solutions.

## 2023-07-19 NOTE — ED Triage Notes (Signed)
C/o bright red rectal bleeding onset yest c/o abd pain denies rectal  pain states she was discharge last thurs after being hosptialized for same for 9 days . Hx. Diverticulitis denies nausea and vomiting

## 2023-07-19 NOTE — Progress Notes (Signed)
Pt arrived to room 5C16 via stretcher from the ED. See assessment. Will continue to monitor.

## 2023-07-19 NOTE — Progress Notes (Signed)
Pt has had 4 separate events of BRBPR this shift since arrival to unit 5c from the ED. Coralie Keens, MD made aware.

## 2023-07-19 NOTE — ED Provider Notes (Signed)
[place] EMERGENCY DEPARTMENT AT [facility] Provider Note  CSN: [id number] Arrival date & time: 07/19/23 1006  Chief Complaint(s) Abdominal Pain and GI Problem  HPI Carol Mueller is a 62 y.o. female with past medical history as below, significant for Cushing's tumor, diverticulosis, diverticulitis, HLD, HTN, PCOS who presents to the ED with complaint of rectal bleeding.    Onset of BRBPR yesterday, bright red blood.  Associated bowel moods, no bleeding outside of bowel movement.  No vaginal bleeding or blood in urine.  Left lower quadrant abdominal pain.  No nausea or vomiting.  Tolerant p.o.  No blood thinners.  Follows with [facility].  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   Diabetes mellitus without complication (HCC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   Diverticulitis    Diverticulosis    GI bleed    Hyperlipidemia    Hypertension    Polycystic ovary disease    Sepsis (HCC) 05/2019   Patient Active Problem List   Diagnosis Date Noted   Acute lower GI bleeding 07/19/2023   Class 3 obesity (HCC) 07/19/2023   Diverticulosis of intestine with bleeding 07/07/2023   Pancolonic diverticulosis 07/07/2023   Internal hemorrhoids 07/07/2023   Diverticulitis of colon with hemorrhage 07/07/2023   Diverticular hemorrhage 07/09/2021   Hyperglycemia due to type 2 diabetes mellitus (HCC) 12/11/2020   Gastric inlet patch of esophagus    Multiple gastric polyps    Melena    Anemia, blood loss 11/24/2020   Benign neoplasm of appendix    GERD (gastroesophageal reflux disease) 11/21/2020   SIRS (systemic inflammatory response syndrome) (HCC) 10/23/2020   Acute GI bleeding 10/23/2020   GIB (gastrointestinal bleeding) 09/23/2020   Hematochezia 10/03/2019   Benign neoplasm of transverse colon    Benign neoplasm of descending colon    GI bleed  10/02/2019   Rectal bleeding 09/18/2019   Fever    Salmonella enteritis 05/28/2019   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   Sepsis (HCC) 05/24/2019   Hypokalemia 06/02/2018   Lower GI bleeding 06/02/2018   Insulin-requiring or dependent type II diabetes mellitus (HCC)    Back pain with left-sided radiculopathy 09/29/2017   Diarrhea 12/16/2016   Lower GI bleed 11/30/2016   History of GI diverticular bleed    Diverticulosis of colon with hemorrhage 06/05/2016   Ischemic colitis (HCC) 08/07/2015   Abdominal pain    Tachycardia 07/15/2015   Type 2 diabetes mellitus with hyperlipidemia (HCC) 09/12/2012   Polycystic disease, ovaries 09/12/2012   Class 3 obesity due to excess calories with body mass index (BMI) of 40.0 to 44.9 in adult 09/12/2012   Essential hypertension 09/12/2012   Hyperlipidemia with target LDL less than 100 09/12/2012   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 (HCC) 09/12/2012   Allergic rhinitis 04/06/2012   Home Medication(s) Prior to Admission medications   Medication Sig Start Date End Date Taking? Authorizing Provider  dicyclomine (BENTYL) 20 MG tablet Take 1 tablet (20 mg total) by mouth 2 (two) times daily for 7 days. 07/19/23 07/26/23 Yes Tanda Rockers A, DO  pantoprazole (PROTONIX) 20 MG tablet Take 1 tablet (20 mg total) by mouth daily for 14 days. 07/19/23 08/02/23 Yes Sloan Leiter, DO  acetaminophen (TYLENOL) 325 MG tablet Take 2 tablets (650 mg total) by mouth every 6 (six) hours as needed for mild  pain (or Fever >/= 101). Patient taking differently: Take 650 mg by mouth as needed for mild pain (or Fever >/= 101). 09/19/19   Berton Mount I, MD  atorvastatin (LIPITOR) 10 MG tablet Take 10 mg by mouth daily. 03/06/19   [provider]  carvedilol (COREG) 25 MG tablet Take 25 mg by mouth 2 (two) times daily. 10/30/20   [provider]  empagliflozin (JARDIANCE) 10 MG TABS tablet Take 10 mg by mouth daily.    [provider]   insulin aspart protamine - aspart (NOVOLOG MIX 70/30 FLEXPEN) (70-30) 100 UNIT/ML FlexPen Inject 10-30 Units into the skin See admin instructions. 10 units in the morning, and 30 at bedtime    [provider]  metformin (FORTAMET) 500 MG (OSM) 24 hr tablet Take 500 mg by mouth 2 (two) times daily with a meal.    [provider]  methocarbamol (ROBAXIN) 500 MG tablet Take 500 mg by mouth as needed for muscle spasms.    [provider]  Multiple Vitamins-Minerals (MULTIVITAMIN WITH MINERALS) tablet Take 1 tablet by mouth daily.    [provider]  olmesartan (BENICAR) 40 MG tablet Take 40 mg by mouth daily. 10/11/19   [provider]  Semaglutide,0.25 or 0.5MG /DOS, (OZEMPIC, 0.25 OR 0.5 MG/DOSE,) 2 MG/1.5ML SOPN Inject 2 mg into the skin once a week.    [provider]                                                                                                                                    Past Surgical History Past Surgical History:  Procedure Laterality Date   ADRENALECTOMY  1996   BREAST BIOPSY Right 2018   BREAST BIOPSY Left    x2   CHOLECYSTECTOMY     COLONOSCOPY WITH PROPOFOL N/A 10/03/2019   Procedure: COLONOSCOPY WITH PROPOFOL;  Surgeon: Meryl Dare, MD;  Location: [facility];  Service: Endoscopy;  Laterality: N/A;   COLONOSCOPY WITH PROPOFOL N/A 11/22/2020   Procedure: COLONOSCOPY WITH PROPOFOL;  Surgeon: Meryl Dare, MD;  Location: [facility];  Service: Endoscopy;  Laterality: N/A;   ESOPHAGOGASTRODUODENOSCOPY (EGD) WITH PROPOFOL N/A 07/16/2015   Procedure: ESOPHAGOGASTRODUODENOSCOPY (EGD) WITH PROPOFOL;  Surgeon: Rachael Fee, MD;  Location: [facility];  Service: Endoscopy;  Laterality: N/A;   ESOPHAGOGASTRODUODENOSCOPY (EGD) WITH PROPOFOL N/A 11/28/2020   Procedure: ESOPHAGOGASTRODUODENOSCOPY (EGD) WITH PROPOFOL;  Surgeon: Shellia Cleverly, DO;  Location: [facility];  Service: Gastroenterology;   Laterality: N/A;   GIVENS CAPSULE STUDY N/A 11/28/2020   Procedure: GIVENS CAPSULE STUDY;  Surgeon: Shellia Cleverly, DO;  Location: [facility];  Service: Gastroenterology;  Laterality: N/A;   POLYPECTOMY  10/03/2019   Procedure: POLYPECTOMY;  Surgeon: Meryl Dare, MD;  Location: [facility];  Service: Endoscopy;;   POLYPECTOMY  11/22/2020   Procedure: POLYPECTOMY;  Surgeon: Meryl Dare, MD;  Location: [facility];  Service: Endoscopy;;  Family History Family History  Problem Relation Age of Onset   Hypertension Mother    Cancer Mother        ?uterine or ovarian cancer   Hypertension Father    Diabetes Father    Hypertension Sister    Diabetes Sister    Hypertension Brother    Diabetes Brother    Hypertension Maternal Grandfather    Diabetes Paternal Grandmother    Breast cancer Maternal Grandmother    Colon cancer Neg Hx     Social History Social History   Tobacco Use   Smoking status: Never   Smokeless tobacco: Never  Vaping Use   Vaping status: Never Used  Substance Use Topics   Alcohol use: No   Drug use: No   Allergies Norvasc [amlodipine besylate], Nsaids, and Sulfa antibiotics  Review of Systems Review of Systems  Constitutional:  Negative for chills and fever.  Respiratory:  Negative for chest tightness and shortness of breath.   Cardiovascular:  Negative for chest pain and palpitations.  Gastrointestinal:  Positive for abdominal pain and anal bleeding. Negative for nausea and vomiting.  Genitourinary:  Negative for difficulty urinating and dysuria.  Skin:  Negative for rash and wound.    Physical Exam Vital Signs  I have reviewed the triage vital signs BP (!) 147/81   Pulse 76   Temp 98.8 F (37.1 C) (Oral)   Resp 20   Ht 5\' 3"  (1.6 m)   Wt 105.7 kg   SpO2 98%   BMI 41.28 kg/m  Physical Exam Vitals and nursing note reviewed.  Constitutional:      General: She is not in acute distress.    Appearance: Normal appearance.  HENT:      Head: Normocephalic and atraumatic.     Right Ear: External ear normal.     Left Ear: External ear normal.     Nose: Nose normal.     Mouth/Throat:     Mouth: Mucous membranes are moist.  Eyes:     General: No scleral icterus.       Right eye: No discharge.        Left eye: No discharge.  Cardiovascular:     Rate and Rhythm: Normal rate and regular rhythm.     Pulses: Normal pulses.     Heart sounds: Normal heart sounds.  Pulmonary:     Effort: Pulmonary effort is normal. No respiratory distress.     Breath sounds: Normal breath sounds. No stridor.  Abdominal:     General: Abdomen is flat. There is no distension.     Palpations: Abdomen is soft.     Tenderness: There is abdominal tenderness in the left lower quadrant.  Musculoskeletal:     Cervical back: No rigidity.     Right lower leg: No edema.     Left lower leg: No edema.  Skin:    General: Skin is warm and dry.     Capillary Refill: Capillary refill takes less than 2 seconds.  Neurological:     Mental Status: She is alert.  Psychiatric:        Mood and Affect: Mood normal.        Behavior: Behavior normal. Behavior is cooperative.     ED Results and Treatments Labs (all labs ordered are listed, but only abnormal results are displayed) Labs Reviewed  CBC WITH DIFFERENTIAL/PLATELET - Abnormal; Notable for the following components:      Result Value   RBC 3.82 (*)  Hemoglobin 9.2 (*)    HCT 30.0 (*)    MCV 78.5 (*)    MCH 24.1 (*)    RDW 20.9 (*)    Platelets 408 (*)    All other components within normal limits  COMPREHENSIVE METABOLIC PANEL - Abnormal; Notable for the following components:   Potassium 3.2 (*)    BUN 6 (*)    Calcium 8.3 (*)    Total Protein 6.2 (*)    Albumin 3.0 (*)    AST 14 (*)    All other components within normal limits  URINALYSIS, ROUTINE W REFLEX MICROSCOPIC - Abnormal; Notable for the following components:   APPearance CLOUDY (*)    Hgb urine dipstick SMALL (*)     Bacteria, UA MANY (*)    All other components within normal limits  LIPASE, BLOOD  PROTIME-INR                                                                                                                          Radiology CT ABDOMEN PELVIS W CONTRAST  Result Date: 07/19/2023 CLINICAL DATA:  Left lower quadrant abdominal pain. EXAM: CT ABDOMEN AND PELVIS WITH CONTRAST TECHNIQUE: Multidetector CT imaging of the abdomen and pelvis was performed using the standard protocol following bolus administration of intravenous contrast. RADIATION DOSE REDUCTION: This exam was performed according to the departmental dose-optimization program which includes automated exposure control, adjustment of the mA and/or kV according to patient size and/or use of iterative reconstruction technique. CONTRAST:  75mL OMNIPAQUE IOHEXOL 350 MG/ML SOLN COMPARISON:  Abdominal CT 07/09/2023 and 07/07/2023. FINDINGS: Lower chest: The visualized lower chest appears unchanged, without acute or significant findings. Hepatobiliary: The liver is normal in density without suspicious focal abnormality. No evidence of biliary dilatation status post cholecystectomy. Pancreas: Unremarkable. No pancreatic ductal dilatation or surrounding inflammatory changes. Spleen: Normal in size without focal abnormality. 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 The left adrenal gland appears normal. No evidence of urinary tract calculus, suspicious renal lesion or hydronephrosis. Simple and mildly complex renal cysts are noted bilaterally which are similar to the previous study. In the upper pole of the left kidney, there is a 1.6 cm lesion with a density of 61 HU, and in the interpolar region of the left kidney, there is a 1.2 cm lesion with a density of 42 HU. These did not appear to be enhancing on the most recent CT which was performed without and with contrast and are consistent with Bosniak 2 cysts. No  specific follow-up imaging recommended. The bladder appears unremarkable for its degree of distention. Stomach/Bowel: No enteric contrast administered. The stomach appears unremarkable for its degree of distension. No evidence of bowel wall thickening, distention or surrounding inflammatory change. The appendix appears normal. There are diverticular changes throughout the colon. Vascular/Lymphatic: There are no enlarged abdominal or pelvic lymph nodes. Minimal atherosclerosis. No acute vascular findings or aneurysm. Reproductive: Numerous calcified and exophytic uterine fibroids are grossly stable.  No suspicious adnexal findings. Other: Stable small periumbilical hernia containing only fat. No ascites or pneumoperitoneum. Musculoskeletal: No acute or significant osseous findings. Multilevel lumbar spondylosis. Unless specific follow-up recommendations are mentioned in the findings or impression sections, no imaging follow-up of any mentioned incidental findings is recommended. IMPRESSION: 1. No acute findings or explanation for the patient's symptoms. 2. Diffuse colonic diverticulosis without evidence of acute inflammation. 3. 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 4. Stable uterine fibroids. Electronically Signed   By: Carey Bullocks M.D.   On: 07/19/2023 12:32    Pertinent labs & imaging results that were available during my care of the patient were reviewed by me and considered in my medical decision making (see MDM for details).  Medications Ordered in ED Medications  sodium chloride 0.9 % bolus 1,000 mL (1,000 mLs Intravenous New Bag/Given 07/19/23 1128)  iohexol (OMNIPAQUE) 350 MG/ML injection 75 mL (75 mLs Intravenous Contrast Given 07/19/23 1204)  potassium chloride SA (KLOR-CON M) CR tablet 40 mEq (40 mEq Oral Given 07/19/23 1251)                                                                                                                                      Procedures Procedures  (including critical care time)  Medical Decision Making / ED Course    Medical Decision Making:    Carol Mueller is a 62 y.o. female with past medical history as below, significant for Cushing's tumor, diverticulosis, diverticulitis, HLD, HTN, PCOS who presents to the ED with complaint of rectal bleeding. . The complaint involves an extensive differential diagnosis and also carries with it a high risk of complications and morbidity.  Serious etiology was considered. Ddx includes but is not limited to: Differential diagnosis includes but is not exclusive to ectopic pregnancy, ovarian cyst, ovarian torsion, acute appendicitis, urinary tract infection, endometriosis, bowel obstruction, hernia, colitis, renal colic, gastroenteritis, volvulus etc.   Complete initial physical exam performed, notably the patient  was NAD, abd ttp LLQ, HDS.    Reviewed and confirmed nursing documentation for past medical history, family history, social history.  Vital signs reviewed.    Clinical Course as of 07/19/23 1548  Mon Jul 19, 2023  1238 CT with diverticulosis no acute diverticulitis  Hemoglobin stable, she is HDS. [SG]  1239 Hemoglobin(!): 9.2 Improved from prior [SG]  1239 Potassium(!): 3.2 Replace orally [SG]  1413 Bacteria, UA(!): MANY [SG]  1414 Squamous Epithelial / HPF: 21-50 Contamination noted  [SG]  1422 Labs/imaging stable. Feeling better. Pain likely 2/2 diverticulosis [SG]    Clinical Course User Index [SG] Tanda Rockers A, DO     Pt with LLQ abd pain, hx diverticulosis  GI bleed today, appears to be lower/BRBPR. HDS. Hgb 9.2, stable, but concern pos lag in value given recent onset of the bleeding. Ongoing bleeding. Recommend overnight obs for serial H/h. GI consulted, [facility]  Admitted [facility]  Additional history obtained: -Additional history obtained from na -External records from outside source obtained and reviewed  including: Chart review including previous notes, labs, imaging, consultation notes including  Prior labs, imaging, home medications   Lab Tests: -I ordered, reviewed, and interpreted labs.   The pertinent results include:   Labs Reviewed  CBC WITH DIFFERENTIAL/PLATELET - Abnormal; Notable for the following components:      Result Value   RBC 3.82 (*)    Hemoglobin 9.2 (*)    HCT 30.0 (*)    MCV 78.5 (*)    MCH 24.1 (*)    RDW 20.9 (*)    Platelets 408 (*)    All other components within normal limits  COMPREHENSIVE METABOLIC PANEL - Abnormal; Notable for the following components:   Potassium 3.2 (*)    BUN 6 (*)    Calcium 8.3 (*)    Total Protein 6.2 (*)    Albumin 3.0 (*)    AST 14 (*)    All other components within normal limits  URINALYSIS, ROUTINE W REFLEX MICROSCOPIC - Abnormal; Notable for the following components:   APPearance CLOUDY (*)    Hgb urine dipstick SMALL (*)    Bacteria, UA MANY (*)    All other components within normal limits  LIPASE, BLOOD  PROTIME-INR    Notable for hemoglobin stable  EKG   EKG Interpretation Date/Time:    Ventricular Rate:    PR Interval:    QRS Duration:    QT Interval:    QTC Calculation:   R Axis:      Text Interpretation:           Imaging Studies ordered: I ordered imaging studies including CT AP I independently visualized the following imaging with scope of interpretation limited to determining acute life threatening conditions related to emergency care; findings noted above, significant for diverticulosis I independently visualized and interpreted imaging. I agree with the radiologist interpretation   Medicines ordered and prescription drug management: Meds ordered this encounter  Medications   sodium chloride 0.9 % bolus 1,000 mL   iohexol (OMNIPAQUE) 350 MG/ML injection 75 mL   potassium chloride SA (KLOR-CON M) CR tablet 40 mEq   dicyclomine (BENTYL) 20 MG tablet    Sig: Take 1 tablet (20 mg total) by  mouth 2 (two) times daily for 7 days.    Dispense:  14 tablet    Refill:  0   pantoprazole (PROTONIX) 20 MG tablet    Sig: Take 1 tablet (20 mg total) by mouth daily for 14 days.    Dispense:  14 tablet    Refill:  0    -I have reviewed the patients home medicines and have made adjustments as needed   Consultations Obtained: I requested consultation with the GI,  and discussed lab and imaging findings as well as pertinent plan - they recommend: Will see tomorrow   Cardiac Monitoring: Continuous pulse oximetry interpreted by myself, 99% on RA.    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    Final Clinical Impression(s) / ED Diagnoses Final diagnoses:  Diverticulosis  Left lower quadrant abdominal pain  Gastrointestinal hemorrhage, unspecified gastrointestinal hemorrhage type  Hypokalemia        Sloan Leiter, DO 07/19/23 1549

## 2023-07-19 NOTE — Assessment & Plan Note (Signed)
Continue blood pressure control with carvedilol and olmesartan.  Continue blood pressure monitoring.

## 2023-07-19 NOTE — Progress Notes (Signed)
Pt had an episode of bright red bloody stool from rectum very large amount. Pt stat3es that she feels increasingly weak despite IV hydration. BP within normal limits but has decline from previous. Will notify on call MD and continue to monitor.

## 2023-07-19 NOTE — Significant Event (Addendum)
Ordered Stat H+H and type and screen on patient.  BP now 92/60, pt dizzy / lightheaded.  Ordering 500cc LR bolus and ordering 1u PRBC transfusion since HGB was 9 range earlier today but since than has had 6 large bloody BMs, so clinically think its time to get working on transfusion.  Ordered keep 2u ahead.  Per CT tech: based on wt and creat, should be okay to get CT GIB study despite CT w contrast earlier in day.  Will get this ordered stat.  RN doesn't feel pt needs transfer to [facility] at this time.  States pt agreeable to transfusion.

## 2023-07-19 NOTE — Progress Notes (Addendum)
Pt unsteady on feet while ambulating back to bed from bathroom. Pt declines to have bed alarm on. RN educated pt on fall risk. Pt verbalizes understanding and continues to decline. Pt states she will use call bell to call for staff assistance with OOB.

## 2023-07-19 NOTE — Plan of Care (Signed)
  Problem: Metabolic: Goal: Ability to maintain appropriate glucose levels will improve Outcome: Progressing   Problem: Nutritional: Goal: Maintenance of adequate nutrition will improve Outcome: Progressing   Problem: Skin Integrity: Goal: Risk for impaired skin integrity will decrease Outcome: Progressing   

## 2023-07-19 NOTE — Assessment & Plan Note (Signed)
Calculated BMI is 41,2

## 2023-07-19 NOTE — Assessment & Plan Note (Addendum)
Patient know to have diverticulosis. Recent hospitalization requiring PRBC transfusion x3.  Plan to continue close monitoring of H&H. Clinically with dehydration and her current hgb may not reflect her true level of anemia.  Add IV fluids with balanced electrolyte solutions. Add oral pantoprazole.  Clear liquids for now.

## 2023-07-19 NOTE — Telephone Encounter (Signed)
Received a page that patient called on-call answering service stating she was going back to the hospital as she has started bleeding again.  Tried to call patient with no answer.  Unable to leave a message due to inbox being full.

## 2023-07-19 NOTE — H&P (Addendum)
History and Physical    Patient: Carol Mueller QIH:[id number] DOB: 1960/12/15 DOA: 07/19/2023 DOS: the patient was seen and examined on 07/19/2023 PCP: [name], DO  Patient coming from: Home  Chief Complaint:  Chief Complaint  Patient presents with   Abdominal Pain   GI Problem   HPI: Carol Mueller is a 62 y.o. female with medical history significant of colonic diverticulosis, hypertension, T2DM, and obesity who presented with bright red blood per rectum.    Recent hospitalization 08/21 to 07/15/23 for colonic diverticular bleed. She did require 3 units PRBC transfusion for anemia of acute blood loss. Work up with abdominal pelvic CT angiography was negative for active bleeding. Bleeding subsided and patient was discharged home. At the time of her discharge her hgb was 8.2   Patient has been doing well at home until yesterday when she noticed Shibley red stools while moving her bowels along with blood in the tissue paper. She only moved her stools one time during the morning hours. The rest of the day she she remained at home, she endorsed feeling generalized weakness. She purposefully had less po intake than her normal because she was afraid of worsening bleeding. This morning she moved her bowels again, but this time had frank bright red per rectum.  No abdominal pain, but positive dizziness when seating or standing. No syncope.   Because of worsening bleeding she came to the hospital for further evaluation.  Denies using aspirin or any non steroidal antiinflammatory agent over the counter.   She had diverticular bleed in the past.      Review of Systems: As mentioned in the history of present illness. All other systems reviewed and are negative. 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   Diabetes mellitus without complication (HCC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   Diverticulitis    Diverticulosis    GI bleed     Hyperlipidemia    Hypertension    Polycystic ovary disease    Sepsis (HCC) 05/2019   Past Surgical History:  Procedure Laterality Date   ADRENALECTOMY  1996   BREAST BIOPSY Right 2018   BREAST BIOPSY Left    x2   CHOLECYSTECTOMY     COLONOSCOPY WITH PROPOFOL N/A 10/03/2019   Procedure: COLONOSCOPY WITH PROPOFOL;  Surgeon: Meryl Dare, MD;  Location: [facility];  Service: Endoscopy;  Laterality: N/A;   COLONOSCOPY WITH PROPOFOL N/A 11/22/2020   Procedure: COLONOSCOPY WITH PROPOFOL;  Surgeon: Meryl Dare, MD;  Location: [facility];  Service: Endoscopy;  Laterality: N/A;   ESOPHAGOGASTRODUODENOSCOPY (EGD) WITH PROPOFOL N/A 07/16/2015   Procedure: ESOPHAGOGASTRODUODENOSCOPY (EGD) WITH PROPOFOL;  Surgeon: Rachael Fee, MD;  [facility];  Service: Endoscopy;  Laterality: N/A;   ESOPHAGOGASTRODUODENOSCOPY (EGD) WITH PROPOFOL N/A 11/28/2020   Procedure: ESOPHAGOGASTRODUODENOSCOPY (EGD) WITH PROPOFOL;  Surgeon: Shellia Cleverly, DO;  Location[facility];  Service: Gastroenterology;  Laterality: N/A;   GIVENS CAPSULE STUDY N/A 11/28/2020   Procedure: GIVENS CAPSULE STUDY;  Surgeon: Shellia Cleverly, DO;  [facility];  Service: Gastroenterology;  Laterality: N/A;   POLYPECTOMY  10/03/2019   Procedure: POLYPECTOMY;  Surgeon: Meryl Dare, MD;  Location: [facility];  Service: Endoscopy;;   POLYPECTOMY  11/22/2020   Procedure: POLYPECTOMY;  Surgeon: Meryl Dare, MD;  Location: [facility];  Service: Endoscopy;;   Social History:  reports that she has never smoked. She has never  used smokeless tobacco. She reports that she does not drink alcohol and does not use drugs.  Allergies  Allergen Reactions   Norvasc [Amlodipine Besylate] Swelling    ANGIOEDEMA   Nsaids Other (See Comments)    GI upset/ GI bleed   Sulfa Antibiotics Other (See Comments)    childhood    Family History  Problem Relation Age of Onset   Hypertension Mother    Cancer  Mother        ?uterine or ovarian cancer   Hypertension Father    Diabetes Father    Hypertension Sister    Diabetes Sister    Hypertension Brother    Diabetes Brother    Hypertension Maternal Grandfather    Diabetes Paternal Grandmother    Breast cancer Maternal Grandmother    Colon cancer Neg Hx     Prior to Admission medications   Medication Sig Start Date End Date Taking? Authorizing Provider  dicyclomine (BENTYL) 20 MG tablet Take 1 tablet (20 mg total) by mouth 2 (two) times daily for 7 days. 07/19/23 07/26/23 Yes Tanda Rockers A, DO  pantoprazole (PROTONIX) 20 MG tablet Take 1 tablet (20 mg total) by mouth daily for 14 days. 07/19/23 08/02/23 Yes Sloan Leiter, DO  acetaminophen (TYLENOL) 325 MG tablet Take 2 tablets (650 mg total) by mouth every 6 (six) hours as needed for mild pain (or Fever >/= 101). Patient taking differently: Take 650 mg by mouth as needed for mild pain (or Fever >/= 101). 09/19/19   Berton Mount I, MD  atorvastatin (LIPITOR) 10 MG tablet Take 10 mg by mouth daily. 03/06/19   [provider]  carvedilol (COREG) 25 MG tablet Take 25 mg by mouth 2 (two) times daily. 10/30/20   [provider]  empagliflozin (JARDIANCE) 10 MG TABS tablet Take 10 mg by mouth daily.    [provider]  insulin aspart protamine - aspart (NOVOLOG MIX 70/30 FLEXPEN) (70-30) 100 UNIT/ML FlexPen Inject 10-30 Units into the skin See admin instructions. 10 units in the morning, and 30 at bedtime    [provider]  metformin (FORTAMET) 500 MG (OSM) 24 hr tablet Take 500 mg by mouth 2 (two) times daily with a meal.    [provider]  methocarbamol (ROBAXIN) 500 MG tablet Take 500 mg by mouth as needed for muscle spasms.    [provider]  Multiple Vitamins-Minerals (MULTIVITAMIN WITH MINERALS) tablet Take 1 tablet by mouth daily.    [provider]  olmesartan (BENICAR) 40 MG tablet Take 40 mg by mouth daily. 10/11/19   [provider]  Semaglutide,0.25 or 0.5MG /DOS, (OZEMPIC, 0.25 OR 0.5 MG/DOSE,) 2 MG/1.5ML SOPN Inject 2 mg into the skin once a week.    [provider]    Physical Exam: Vitals:   07/19/23 1023 07/19/23 1025 07/19/23 1215 07/19/23 1245  BP: (!) 140/83  136/73 (!) 147/81  Pulse: 81  72 76  Resp: 16  16 20   Temp: 98.1 F (36.7 C)   98.8 F (37.1 C)  TempSrc: Oral   Oral  SpO2: 97%  99% 98%  Weight:  105.7 kg    Height:  5\' 3"  (1.6 m)     Neurology awake and alert ENT with no pallor or icterus Cardiovascular with S1 and S2 present and regular with no gallops, rubs or murmurs Respiratory with no rales or wheezing, no rhonchi Abdomen with no distention, non tender, no rebound or guarding.  No lower extremity edema.  Data Reviewed:   Na 135, K 3.2 Cl 101, bicarbonate 26, glucose 91, bun 6 cr 0,76 AST 14 ALT 13  Wbc 7,4 hgb 9,2 plt 408 Urine analysis SG 1,012, negative protein, negative leukocytes and small hgb.   CT abdomen and pelvis with no acute findings. Diffuse colonic diverticulosis with no evidence of acute inflammation. 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   Assessment and Plan: Acute lower GI bleeding Patient know to have diverticulosis. Recent hospitalization requiring PRBC transfusion x3.  Plan to continue close monitoring of H&H. Clinically with dehydration and her current hgb may not reflect her true level of anemia.  Add IV fluids with balanced electrolyte solutions. Add oral pantoprazole.  Clear liquids for now.   Hypokalemia Add Kcl 40 meq x2 and follow up on Mg. Follow up renal function and electrolytes in am.  Continue hydration with balanced electrolyte solutions.   Essential hypertension Continue blood pressure control with carvedilol and olmesartan.  Continue blood pressure monitoring.   Type 2 diabetes mellitus with hyperlipidemia (HCC) Hold on long acting insulin for now, while patient on clear liquids. Add  insulin sliding scale for glucose cover and monitoring. Hold on metformin for now.   Continue statin therapy.  Class 3 obesity (HCC) Calculated BMI is 41,2       Advance Care Planning:   Code Status: Full Code   Consults: GI was called by ED   Family Communication: no family at the bedside   Severity of Illness: The appropriate patient status for this patient is OBSERVATION. Observation status is judged to be reasonable and necessary in order to provide the required intensity of service to ensure the patient's safety. The patient's presenting symptoms, physical exam findings, and initial radiographic and laboratory data in the context of their medical condition is felt to place them at decreased risk for further clinical deterioration. Furthermore, it is anticipated that the patient will be medically stable for discharge from the hospital within 2 midnights of admission.   Author: Coralie Keens, MD 07/19/2023 3:25 PM  For on call review [email or web address].

## 2023-07-19 NOTE — ED Notes (Signed)
ED TO INPATIENT HANDOFF REPORT  ED Nurse Name and Phone #: Morrie Sheldon RN [phone]  S Name/Age/Gender Carol Mueller 62 y.o. female Room/Bed: H012C/H012C  Code Status   Code Status: Full Code  Home/SNF/Other Home Patient oriented to: self, place, time, and situation Is this baseline? Yes   Triage Complete: Triage complete  Chief Complaint Acute lower GI bleeding [K92.2]  Triage Note C/o bright red rectal bleeding onset yest c/o abd pain denies rectal  pain states she was discharge last thurs after being hosptialized for same for 9 days . Hx. Diverticulitis denies nausea and vomiting   Allergies Allergies  Allergen Reactions   Norvasc [Amlodipine Besylate] Swelling    ANGIOEDEMA   Nsaids Other (See Comments)    GI upset/ GI bleed   Sulfa Antibiotics Other (See Comments)    childhood    Level of Care/Admitting Diagnosis ED Disposition     ED Disposition  Admit   Condition  --   Comment  Hospital Area: [facility] [[id number]]  Level of Care: Med-Surg [16]  May place patient in observation at [facility] or [facility] if equivalent level of care is available:: No  Covid Evaluation: Asymptomatic - no recent exposure (last 10 days) testing not required  Diagnosis: Acute lower GI bleeding [829562]  Admitting Physician: Coralie Keens [[id number]]  Attending Physician: Coralie Keens [[id number]]          B Medical/Surgery History 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   Diabetes mellitus without complication (HCC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   Diverticulitis    Diverticulosis    GI bleed    Hyperlipidemia    Hypertension    Polycystic ovary disease    Sepsis (HCC) 05/2019   Past Surgical History:  Procedure Laterality Date   ADRENALECTOMY  1996   BREAST BIOPSY Right 2018   BREAST BIOPSY Left    x2   CHOLECYSTECTOMY     COLONOSCOPY WITH PROPOFOL N/A 10/03/2019    Procedure: COLONOSCOPY WITH PROPOFOL;  Surgeon: Meryl Dare, MD;  [facility];  Service: Endoscopy;  Laterality: N/A;   COLONOSCOPY WITH PROPOFOL N/A 11/22/2020   Procedure: COLONOSCOPY WITH PROPOFOL;  Surgeon: Meryl Dare, MD;  [facility];  Service: Endoscopy;  Laterality: N/A;   ESOPHAGOGASTRODUODENOSCOPY (EGD) WITH PROPOFOL N/A 07/16/2015   Procedure: ESOPHAGOGASTRODUODENOSCOPY (EGD) WITH PROPOFOL;  Surgeon: Rachael Fee, MD;  [facility];  Service: Endoscopy;  Laterality: N/A;   ESOPHAGOGASTRODUODENOSCOPY (EGD) WITH PROPOFOL N/A 11/28/2020   Procedure: ESOPHAGOGASTRODUODENOSCOPY (EGD) WITH PROPOFOL;  Surgeon: Shellia Cleverly, DO;  [facility];  Service: Gastroenterology;  Laterality: N/A;   GIVENS CAPSULE STUDY N/A 11/28/2020   Procedure: GIVENS CAPSULE STUDY;  Surgeon: Shellia Cleverly, DO;  [facility];  Service: Gastroenterology;  Laterality: N/A;   POLYPECTOMY  10/03/2019   Procedure: POLYPECTOMY;  Surgeon: Meryl Dare, MD;  Location: [facility];  Service: Endoscopy;;   POLYPECTOMY  11/22/2020   Procedure: POLYPECTOMY;  Surgeon: Meryl Dare, MD;  Location: [facility];  Service: Endoscopy;;     A IV Location/Drains/Wounds Patient Lines/Drains/Airways Status     Active Line/Drains/Airways     Name Placement date Placement time Site Days   Peripheral IV 07/19/23 20 G Left Antecubital 07/19/23  1127  Antecubital  less than 1            Intake/Output Last  24 hours No intake or output data in the 24 hours ending 07/19/23 1525  Labs/Imaging Results for orders placed or performed during the hospital encounter of 07/19/23 (from the past 48 hour(s))  Urinalysis, Routine w reflex microscopic -Urine, Clean Catch     Status: Abnormal   Collection Time: 07/19/23 10:31 AM  Result Value Ref Range   Color, Urine YELLOW YELLOW   APPearance CLOUDY (A) CLEAR   Specific Gravity, Urine 1.012 1.005 - 1.030   pH  5.0 5.0 - 8.0   Glucose, UA NEGATIVE NEGATIVE mg/dL   Hgb urine dipstick SMALL (A) NEGATIVE   Bilirubin Urine NEGATIVE NEGATIVE   Ketones, ur NEGATIVE NEGATIVE mg/dL   Protein, ur NEGATIVE NEGATIVE mg/dL   Nitrite NEGATIVE NEGATIVE   Leukocytes,Ua NEGATIVE NEGATIVE   RBC / HPF 0-5 0 - 5 RBC/hpf   WBC, UA 0-5 0 - 5 WBC/hpf   Bacteria, UA MANY (A) NONE SEEN   Squamous Epithelial / HPF 21-50 0 - 5 /HPF    Comment: Performed at [facility], [address], [place], [place] [address]  CBC with Differential     Status: Abnormal   Collection Time: 07/19/23 10:52 AM  Result Value Ref Range   WBC 7.4 4.0 - 10.5 K/uL   RBC 3.82 (L) 3.87 - 5.11 MIL/uL   Hemoglobin 9.2 (L) 12.0 - 15.0 g/dL   HCT 56.3 (L) 87.5 - 64.3 %   MCV 78.5 (L) 80.0 - 100.0 fL   MCH 24.1 (L) 26.0 - 34.0 pg   MCHC 30.7 30.0 - 36.0 g/dL   RDW 32.9 (H) 51.8 - 84.1 %   Platelets 408 (H) 150 - 400 K/uL   nRBC 0.0 0.0 - 0.2 %   Neutrophils Relative % 63 %   Neutro Abs 4.7 1.7 - 7.7 K/uL   Lymphocytes Relative 24 %   Lymphs Abs 1.8 0.7 - 4.0 K/uL   Monocytes Relative 8 %   Monocytes Absolute 0.6 0.1 - 1.0 K/uL   Eosinophils Relative 4 %   Eosinophils Absolute 0.3 0.0 - 0.5 K/uL   Basophils Relative 1 %   Basophils Absolute 0.0 0.0 - 0.1 K/uL   Immature Granulocytes 0 %   Abs Immature Granulocytes 0.03 0.00 - 0.07 K/uL    Comment: Performed at [facility], [address] [place], [place] [address]  Comprehensive metabolic panel     Status: Abnormal   Collection Time: 07/19/23 10:52 AM  Result Value Ref Range   Sodium 135 135 - 145 mmol/L   Potassium 3.2 (L) 3.5 - 5.1 mmol/L   Chloride 101 98 - 111 mmol/L   CO2 26 22 - 32 mmol/L   Glucose, Bld 91 70 - 99 mg/dL    Comment: Glucose reference range applies only to samples taken after fasting for at least 8 hours.   BUN 6 (L) 8 - 23 mg/dL   Creatinine, Ser 0.16 0.44 - 1.00 mg/dL   Calcium 8.3 (L) 8.9 - 10.3 mg/dL   Total Protein 6.2 (L) 6.5 - 8.1 g/dL    Albumin 3.0 (L) 3.5 - 5.0 g/dL   AST 14 (L) 15 - 41 U/L   ALT 13 0 - 44 U/L   Alkaline Phosphatase 48 38 - 126 U/L   Total Bilirubin 0.8 0.3 - 1.2 mg/dL   GFR, Estimated >01 >09 mL/min    Comment: (NOTE) Calculated using the CKD-EPI Creatinine Equation (2021)    Anion gap 8 5 - 15  Comment: Performed at [facility], [address], [place], [place] [address]  Lipase, blood     Status: None   Collection Time: 07/19/23 10:52 AM  Result Value Ref Range   Lipase 26 11 - 51 U/L    Comment: Performed at [facility], [address] [place], [place] [address]  Protime-INR     Status: None   Collection Time: 07/19/23 10:52 AM  Result Value Ref Range   Prothrombin Time 12.8 11.4 - 15.2 seconds   INR 0.9 0.8 - 1.2    Comment: (NOTE) INR goal varies based on device and disease states. Performed at [facility], [address], [place], [place] [address]    CT ABDOMEN PELVIS W CONTRAST  Result Date: 07/19/2023 CLINICAL DATA:  Left lower quadrant abdominal pain. EXAM: CT ABDOMEN AND PELVIS WITH CONTRAST TECHNIQUE: Multidetector CT imaging of the abdomen and pelvis was performed using the standard protocol following bolus administration of intravenous contrast. RADIATION DOSE REDUCTION: This exam was performed according to the departmental dose-optimization program which includes automated exposure control, adjustment of the mA and/or kV according to patient size and/or use of iterative reconstruction technique. CONTRAST:  75mL OMNIPAQUE IOHEXOL 350 MG/ML SOLN COMPARISON:  Abdominal CT 07/09/2023 and 07/07/2023. FINDINGS: Lower chest: The visualized lower chest appears unchanged, without acute or significant findings. Hepatobiliary: The liver is normal in density without suspicious focal abnormality. No evidence of biliary dilatation status post cholecystectomy. Pancreas: Unremarkable. No pancreatic ductal dilatation or surrounding inflammatory changes. Spleen: Normal in size  without focal abnormality. 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 The left adrenal gland appears normal. No evidence of urinary tract calculus, suspicious renal lesion or hydronephrosis. Simple and mildly complex renal cysts are noted bilaterally which are similar to the previous study. In the upper pole of the left kidney, there is a 1.6 cm lesion with a density of 61 HU, and in the interpolar region of the left kidney, there is a 1.2 cm lesion with a density of 42 HU. These did not appear to be enhancing on the most recent CT which was performed without and with contrast and are consistent with Bosniak 2 cysts. No specific follow-up imaging recommended. The bladder appears unremarkable for its degree of distention. Stomach/Bowel: No enteric contrast administered. The stomach appears unremarkable for its degree of distension. No evidence of bowel wall thickening, distention or surrounding inflammatory change. The appendix appears normal. There are diverticular changes throughout the colon. Vascular/Lymphatic: There are no enlarged abdominal or pelvic lymph nodes. Minimal atherosclerosis. No acute vascular findings or aneurysm. Reproductive: Numerous calcified and exophytic uterine fibroids are grossly stable. No suspicious adnexal findings. Other: Stable small periumbilical hernia containing only fat. No ascites or pneumoperitoneum. Musculoskeletal: No acute or significant osseous findings. Multilevel lumbar spondylosis. Unless specific follow-up recommendations are mentioned in the findings or impression sections, no imaging follow-up of any mentioned incidental findings is recommended. IMPRESSION: 1. No acute findings or explanation for the patient's symptoms. 2. Diffuse colonic diverticulosis without evidence of acute inflammation. 3. 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 4. Stable uterine fibroids. Electronically Signed   By: Carey Bullocks M.D.   On: 07/19/2023 12:32    Pending Labs [employer] (From admission, onward)     Start     Ordered   Signed and Held  HIV Antibody (routine testing w rflx)  (HIV Antibody (Routine testing w reflex) panel)  Once,   R  Signed and Held   Signed and Held  Basic metabolic panel  Tomorrow morning,   R        Signed and Held   Signed and Held  CBC  Tomorrow morning,   R        Signed and Held            Vitals/Pain Today's Vitals   07/19/23 1023 07/19/23 1025 07/19/23 1215 07/19/23 1245  BP: (!) 140/83  136/73 (!) 147/81  Pulse: 81  72 76  Resp: 16  16 20   Temp: 98.1 F (36.7 C)   98.8 F (37.1 C)  TempSrc: Oral   Oral  SpO2: 97%  99% 98%  Weight:  105.7 kg    Height:  5\' 3"  (1.6 m)    PainSc:  0-No pain      Isolation Precautions No active isolations  Medications Medications  sodium chloride 0.9 % bolus 1,000 mL (1,000 mLs Intravenous New Bag/Given 07/19/23 1128)  iohexol (OMNIPAQUE) 350 MG/ML injection 75 mL (75 mLs Intravenous Contrast Given 07/19/23 1204)  potassium chloride SA (KLOR-CON M) CR tablet 40 mEq (40 mEq Oral Given 07/19/23 1251)    Mobility walks with person assist     Focused Assessments Pulmonary Assessment Handoff:  Lung sounds:   O2 Device: Room Air      R Recommendations: See Admitting Provider Note  Report given to:   Additional Notes:

## 2023-07-19 NOTE — Telephone Encounter (Signed)
Patient paged on-call service 3 times while in the ED. Call was returned and patient advised she was in ED due to recurrent rectal bleeding and wanted to ensure we would see her.  I discussed that the ED providers must do their initial workup and if they deem GI to be consulted we will then see her, but not before we are consulted.

## 2023-07-20 ENCOUNTER — Observation Stay (HOSPITAL_COMMUNITY): Payer: No Typology Code available for payment source

## 2023-07-20 DIAGNOSIS — E249 Cushing's syndrome, unspecified: Secondary | ICD-10-CM | POA: Diagnosis present

## 2023-07-20 DIAGNOSIS — D649 Anemia, unspecified: Secondary | ICD-10-CM

## 2023-07-20 DIAGNOSIS — E860 Dehydration: Secondary | ICD-10-CM | POA: Diagnosis present

## 2023-07-20 DIAGNOSIS — K5730 Diverticulosis of large intestine without perforation or abscess without bleeding: Secondary | ICD-10-CM | POA: Diagnosis not present

## 2023-07-20 DIAGNOSIS — Z882 Allergy status to sulfonamides status: Secondary | ICD-10-CM | POA: Diagnosis not present

## 2023-07-20 DIAGNOSIS — D62 Acute posthemorrhagic anemia: Secondary | ICD-10-CM | POA: Diagnosis present

## 2023-07-20 DIAGNOSIS — Z803 Family history of malignant neoplasm of breast: Secondary | ICD-10-CM | POA: Diagnosis not present

## 2023-07-20 DIAGNOSIS — Z833 Family history of diabetes mellitus: Secondary | ICD-10-CM | POA: Diagnosis not present

## 2023-07-20 DIAGNOSIS — E876 Hypokalemia: Secondary | ICD-10-CM | POA: Diagnosis present

## 2023-07-20 DIAGNOSIS — E1169 Type 2 diabetes mellitus with other specified complication: Secondary | ICD-10-CM | POA: Diagnosis present

## 2023-07-20 DIAGNOSIS — K921 Melena: Secondary | ICD-10-CM | POA: Diagnosis not present

## 2023-07-20 DIAGNOSIS — Z8041 Family history of malignant neoplasm of ovary: Secondary | ICD-10-CM | POA: Diagnosis not present

## 2023-07-20 DIAGNOSIS — Z7985 Long-term (current) use of injectable non-insulin antidiabetic drugs: Secondary | ICD-10-CM | POA: Diagnosis not present

## 2023-07-20 DIAGNOSIS — Z794 Long term (current) use of insulin: Secondary | ICD-10-CM | POA: Diagnosis not present

## 2023-07-20 DIAGNOSIS — E282 Polycystic ovarian syndrome: Secondary | ICD-10-CM | POA: Diagnosis present

## 2023-07-20 DIAGNOSIS — K5731 Diverticulosis of large intestine without perforation or abscess with bleeding: Secondary | ICD-10-CM | POA: Diagnosis present

## 2023-07-20 DIAGNOSIS — K5790 Diverticulosis of intestine, part unspecified, without perforation or abscess without bleeding: Secondary | ICD-10-CM

## 2023-07-20 DIAGNOSIS — Z79899 Other long term (current) drug therapy: Secondary | ICD-10-CM | POA: Diagnosis not present

## 2023-07-20 DIAGNOSIS — K648 Other hemorrhoids: Secondary | ICD-10-CM | POA: Diagnosis present

## 2023-07-20 DIAGNOSIS — Z8249 Family history of ischemic heart disease and other diseases of the circulatory system: Secondary | ICD-10-CM | POA: Diagnosis not present

## 2023-07-20 DIAGNOSIS — Z9049 Acquired absence of other specified parts of digestive tract: Secondary | ICD-10-CM | POA: Diagnosis not present

## 2023-07-20 DIAGNOSIS — K922 Gastrointestinal hemorrhage, unspecified: Secondary | ICD-10-CM | POA: Diagnosis not present

## 2023-07-20 DIAGNOSIS — I10 Essential (primary) hypertension: Secondary | ICD-10-CM | POA: Diagnosis present

## 2023-07-20 DIAGNOSIS — Z7984 Long term (current) use of oral hypoglycemic drugs: Secondary | ICD-10-CM | POA: Diagnosis not present

## 2023-07-20 DIAGNOSIS — E785 Hyperlipidemia, unspecified: Secondary | ICD-10-CM | POA: Diagnosis present

## 2023-07-20 DIAGNOSIS — Z888 Allergy status to other drugs, medicaments and biological substances status: Secondary | ICD-10-CM | POA: Diagnosis not present

## 2023-07-20 DIAGNOSIS — Z6841 Body Mass Index (BMI) 40.0 and over, adult: Secondary | ICD-10-CM | POA: Diagnosis not present

## 2023-07-20 LAB — CBC
HCT: 21.3 % — ABNORMAL LOW (ref 36.0–46.0)
Hemoglobin: 6.6 g/dL — CL (ref 12.0–15.0)
MCH: 23.7 pg — ABNORMAL LOW (ref 26.0–34.0)
MCHC: 31 g/dL (ref 30.0–36.0)
MCV: 76.3 fL — ABNORMAL LOW (ref 80.0–100.0)
Platelets: 309 10*3/uL (ref 150–400)
RBC: 2.79 MIL/uL — ABNORMAL LOW (ref 3.87–5.11)
RDW: 20.8 % — ABNORMAL HIGH (ref 11.5–15.5)
WBC: 9.7 10*3/uL (ref 4.0–10.5)
nRBC: 0 % (ref 0.0–0.2)

## 2023-07-20 LAB — BASIC METABOLIC PANEL
Anion gap: 10 (ref 5–15)
BUN: 9 mg/dL (ref 8–23)
CO2: 25 mmol/L (ref 22–32)
Calcium: 8.2 mg/dL — ABNORMAL LOW (ref 8.9–10.3)
Chloride: 105 mmol/L (ref 98–111)
Creatinine, Ser: 0.76 mg/dL (ref 0.44–1.00)
GFR, Estimated: >60 mL/min (ref >60)
Glucose, Bld: 107 mg/dL — ABNORMAL HIGH (ref 70–99)
Potassium: 3.9 mmol/L (ref 3.5–5.1)
Sodium: 140 mmol/L (ref 135–145)

## 2023-07-20 LAB — GLUCOSE, CAPILLARY
Glucose-Capillary: 86 mg/dL (ref 70–99)
Glucose-Capillary: 144 mg/dL — ABNORMAL HIGH (ref 70–99)
Glucose-Capillary: 133 mg/dL — ABNORMAL HIGH (ref 70–99)
Glucose-Capillary: 111 mg/dL — ABNORMAL HIGH (ref 70–99)

## 2023-07-20 LAB — HEMOGLOBIN AND HEMATOCRIT, BLOOD
HCT: 25.7 % — ABNORMAL LOW (ref 36.0–46.0)
Hemoglobin: 8.1 g/dL — ABNORMAL LOW (ref 12.0–15.0)

## 2023-07-20 LAB — PREPARE RBC (CROSSMATCH)

## 2023-07-20 LAB — MAGNESIUM: Magnesium: 1.5 mg/dL — ABNORMAL LOW (ref 1.7–2.4)

## 2023-07-20 LAB — HIV ANTIBODY (ROUTINE TESTING W REFLEX): HIV Screen 4th Generation wRfx: NONREACTIVE

## 2023-07-20 MED ORDER — PANTOPRAZOLE SODIUM 40 MG PO TBEC
40.0000 mg | DELAYED_RELEASE_TABLET | Freq: Every day | ORAL | Status: DC
  Administered 2023-07-20 – 2023-07-22 (×3): 40 mg via ORAL
  Filled 2023-07-20 (×3): qty 1

## 2023-07-20 MED ORDER — IPRATROPIUM-ALBUTEROL 0.5-2.5 (3) MG/3ML IN SOLN: 3.0000 mL | RESPIRATORY_TRACT | Status: DC | PRN

## 2023-07-20 MED ORDER — MAGNESIUM SULFATE 2 GM/50ML IV SOLN
2.0000 g | Freq: Once | INTRAVENOUS | Status: AC
  Administered 2023-07-20: 2 g via INTRAVENOUS
  Filled 2023-07-20: qty 50

## 2023-07-20 MED ORDER — TRAZODONE HCL 50 MG PO TABS: 50.0000 mg | ORAL_TABLET | Freq: Every evening | ORAL | Status: DC | PRN

## 2023-07-20 MED ORDER — PEG-KCL-NACL-NASULF-NA ASC-C 100 G PO SOLR
0.5000 | Freq: Once | ORAL | Status: AC
  Administered 2023-07-20: 100 g via ORAL
  Filled 2023-07-20: qty 1

## 2023-07-20 MED ORDER — DEXTROSE-SODIUM CHLORIDE 5-0.45 % IV SOLN: INTRAVENOUS | Status: AC

## 2023-07-20 MED ORDER — SENNOSIDES-DOCUSATE SODIUM 8.6-50 MG PO TABS: 1.0000 | ORAL_TABLET | Freq: Every evening | ORAL | Status: DC | PRN

## 2023-07-20 MED ORDER — HYDRALAZINE HCL 20 MG/ML IJ SOLN: 10.0000 mg | INTRAMUSCULAR | Status: DC | PRN

## 2023-07-20 MED ORDER — PEG-KCL-NACL-NASULF-NA ASC-C 100 G PO SOLR: 1.0000 | Freq: Once | ORAL | Status: DC

## 2023-07-20 MED ORDER — IOHEXOL 350 MG/ML SOLN
100.0000 mL | Freq: Once | INTRAVENOUS | Status: AC | PRN
  Administered 2023-07-20: 100 mL via INTRAVENOUS

## 2023-07-20 MED ORDER — GUAIFENESIN 100 MG/5ML PO LIQD: 5.0000 mL | ORAL | Status: DC | PRN

## 2023-07-20 NOTE — Plan of Care (Signed)
  Problem: Education: Goal: Individualized Educational Video(s) Outcome: Progressing Pt has a history of DM2.  While she is here in the hospital MD ordered for her BSBG to be monitored ACHS.  She is receiving insulin coverage depending on what her BSBG levels are per MD's orders.   Problem: Fluid Volume: Goal: Ability to maintain a balanced intake and output will improve Outcome: Progressing Pt is on a clear liquid diet and receiving IVF of D51/2NS at 75 ml/hr per MD's orders.  She is also on strict I/o's with daily weights per MD's orders.  Problem: Nutritional: Goal: Maintenance of adequate nutrition will improve Outcome: Progressing Pt has a history of DM2.  While she is here in the hospital MD ordered for her BSBG to be monitored ACHS.  She is receiving insulin coverage depending on what her BSBG levels are per MD's orders.   Problem: Skin Integrity: Goal: Risk for impaired skin integrity will decrease Outcome: Progressing Skin integrity monitored and assessed q-shift.  Instructed pt to turn/ reposition himself q2 hours to prevent further skin impairment.  Tubes and drains assessed for device related pressure sores.  Pt is continent of both bowel and bladder.  Dressing changes performed per MD's orders.

## 2023-07-20 NOTE — H&P (View-Only) (Signed)
Referring Provider: Dr. Stephania Fragmin Primary Care Physician:  [name], DO Primary Gastroenterologist:  Dr. Christella Hartigan, Dr. Barron Alvine   Reason for Consultation: GI bleed, anemia  HPI: Carol Mueller is a 62 y.o. female with a past medical history of hypertension, hyperlipidemia, type II, Cushing's syndrome, colon polyps and pandiverticulosis with recurrent diverticular bleed. Past cholecystectomy, adrenalectomy and partial hysterectomy.  She was recently admitted to the hospital 8/21 - 07/15/2023 secondary to passing dark red hematochezia. CTA 8/21 and 07/09/2023 were negative for active GI bleeding. Admission hemoglobin level was 13 which dropped to 6.7 and she was transfused 3 units of PRBCs. She noted having 1 episode of black tarry stool during this hospital admission without recurrence. She was seen by our inpatient GI service and endoscopic intervention was not required. Her hemoglobin stabilized without further active GI bleeding and she was discharged home and she was scheduled for a follow-up appointment in our office today.  She developed painless bright red hematochezia yesterday and presented to the ED for further evaluation as she was concerned she was having recurrent diverticular bleeding. Labs in the ED showed a hemoglobin level of 9.2 ( Hg level was 8.2 on 07/15/2023 when she was discharged from the hospital as noted above). Hematocrit 30.  Platelet 408.  BUN 6.  Creatinine 0.76.  Potassium 3.2.  Normal LFTs.  Lipase 26.  INR 0.9.  HIV nonreactive.  Her hemoglobin level dropped to 6.6 at 1 AM today.  She was transfused 1 unit PRBCs, posttransfusion H&H not yet collected.  Abdominal/pelvic CTA was negative for active GI bleeding.  She endorsed being somewhat constipated following her hospital discharge 07/15/2023.  She partially self disimpacted a small amount of stool then passed a large nonbloody formed stool. She passed normal bowel movements for the next 2 days.  Sunday 9/1 she  passed a formed Agudelo-red stool.  Monday 9/2 she passed 3 bright red bloody bowel movements and presented to the ED.  She had 3 episodes of bright red hematochezia in the ED yesterday and 5 bright red bloody bowel movements yesterday since being on the medical unit from 4pm to 8 pm.  She denies having any further hematochezia overnight or thus far this morning.  No NSAID use.  She has intermittent heartburn at home, takes Nexium OTC or Mylanta for 1 week at a time as needed.  She has intermittent LUQ pain for the past few months which is sometimes worse after eating. No further black stools since her hospital admission 06/2023 as noted above. She had some shortness of breath yesterday which abated.  No chest pain or dizziness.  Her sister is at the bedside.  Her most recent endoscopic procedures were 11/2020 during hospital admission for GI bleed.  EGD, colonoscopy and VCE findings did not identify the etiology for her GI bleed.  Suspected recurrent GI bleed.  Note, prior hospital admission 11/2020 for suspected GI bleed showed a negative CTA and tagged red blood cell scan.   GI PROCEDURES:  Small bowel capsule endoscopy 12/03/2020: Small area of focal erythema versus subtle nonbleeding erosion in the distal small bowel. This appears to be an incidental finding and not likely to be of clinical consequence and unlikely to be source of recurrent bleeding. This would be out of reach of a single balloon enteroscopy and do not feel that referral for retrograde double-balloon enteroscopy is necessary. Otherwise completely normal study.  No blood noted throughout the small bowel lumen and visualized colon. Based on these  findings and recent extensive evaluation favor recurrent diverticular bleed.  EGD 11/28/2020: - Ectopic gastric mucosa in the upper third of the esophagus. - Normal none none none - Normal esophagus. - A few gastric polyps. - Normal incisura, antrum and pylorus. - Normal duodenal bulb, first  portion of the duodenum and second portion of the duodenum. - Successful completion of the Video Capsule Enteroscope placement. - No specimens collected.   Colonoscopy 11/22/2020: - One 9 mm polyp at the appendiceal orifice, removed with a cold snare. Resected and retrieved. - One 6 mm polyp in the transverse colon, removed with a cold snare. Resected and retrieved. - Internal hemorrhoids. - Moderate diverticulosis in the entire examined colon. There was evidence of an impacted diverticulum. There was no evidence of diverticular bleeding. No blood in the colon. - The exam was otherwise normal on direct and retroflexion views. Biopsy Report: A. COLON, CECAL POLYP, POLYPECTOMY:  - Benign colonic mucosa with lymphoid aggregate.  - No dysplasia or malignancy.   B. COLON, TRANSVERSE POLYP, POLYECTOMY:  - Hyperplastic polyp.  - No dysplasia or malignancy.    Colonoscopy 10/03/2019: - Two 6 to 8 mm polyps in the descending colon and in the transverse colon, removed with a cold snare. Resected and retrieved.  - Mild diverticulosis in the right colon. There was no evidence of diverticular bleeding.  - Moderate diverticulosis in the left colon. There was no evidence of diverticular bleeding.  - Internal hemorrhoids.  - The examination was otherwise normal on direct and retroflexion views.  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   Diabetes mellitus without complication (HCC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   Diverticulitis    Diverticulosis    GI bleed    Hyperlipidemia    Hypertension    Polycystic ovary disease    Sepsis (HCC) 05/2019    Past Surgical History:  Procedure Laterality Date   ADRENALECTOMY  1996   BREAST BIOPSY Right 2018   BREAST BIOPSY Left    x2   CHOLECYSTECTOMY     COLONOSCOPY WITH PROPOFOL N/A 10/03/2019   Procedure: COLONOSCOPY WITH PROPOFOL;  Surgeon: Meryl Dare, MD;  Location: [facility];   Service: Endoscopy;  Laterality: N/A;   COLONOSCOPY WITH PROPOFOL N/A 11/22/2020   Procedure: COLONOSCOPY WITH PROPOFOL;  Surgeon: Meryl Dare, MD;  Location: [facility];  Service: Endoscopy;  Laterality: N/A;   ESOPHAGOGASTRODUODENOSCOPY (EGD) WITH PROPOFOL N/A 07/16/2015   Procedure: ESOPHAGOGASTRODUODENOSCOPY (EGD) WITH PROPOFOL;  Surgeon: Rachael Fee, MD;  Location: [facility];  Service: Endoscopy;  Laterality: N/A;   ESOPHAGOGASTRODUODENOSCOPY (EGD) WITH PROPOFOL N/A 11/28/2020   Procedure: ESOPHAGOGASTRODUODENOSCOPY (EGD) WITH PROPOFOL;  Surgeon: Shellia Cleverly, DO;  Location: [facility];  Service: Gastroenterology;  Laterality: N/A;   GIVENS CAPSULE STUDY N/A 11/28/2020   Procedure: GIVENS CAPSULE STUDY;  Surgeon: Shellia Cleverly, DO;  Location: [facility];  Service: Gastroenterology;  Laterality: N/A;   POLYPECTOMY  10/03/2019   Procedure: POLYPECTOMY;  Surgeon: Meryl Dare, MD;  Location: [facility];  Service: Endoscopy;;   POLYPECTOMY  11/22/2020   Procedure: POLYPECTOMY;  Surgeon: Meryl Dare, MD;  Location: [facility];  Service: Endoscopy;;    Prior to Admission medications   Medication Sig Start Date End Date Taking? Authorizing Provider  acetaminophen (TYLENOL) 325 MG tablet Take 2 tablets (650 mg total) by mouth every 6 (six) hours as needed for mild pain (or  Fever >/= 101). Patient taking differently: Take 650 mg by mouth as needed for mild pain (or Fever >/= 101). 09/19/19  Yes Berton Mount I, MD  atorvastatin (LIPITOR) 10 MG tablet Take 10 mg by mouth daily. 03/06/19  Yes [provider]  BD PEN NEEDLE NANO 2ND GEN 32G X 4 MM MISC Inject 1 Needle as directed daily. 07/15/23  Yes [provider]  carvedilol (COREG) 25 MG tablet Take 25 mg by mouth 2 (two) times daily. 10/30/20  Yes [provider]  dicyclomine (BENTYL) 20 MG tablet Take 1 tablet (20 mg total) by mouth 2 (two) times daily for 7 days. 07/19/23 07/26/23 Yes  Tanda Rockers A, DO  empagliflozin (JARDIANCE) 10 MG TABS tablet Take 10 mg by mouth daily.   Yes [provider]  insulin aspart protamine - aspart (NOVOLOG MIX 70/30 FLEXPEN) (70-30) 100 UNIT/ML FlexPen Inject 10-30 Units into the skin See admin instructions. 10 units in the morning, and 30 at bedtime   Yes [provider]  metformin (FORTAMET) 500 MG (OSM) 24 hr tablet Take 500 mg by mouth 2 (two) times daily with a meal.   Yes [provider]  methocarbamol (ROBAXIN) 500 MG tablet Take 500 mg by mouth as needed for muscle spasms.   Yes [provider]  Multiple Vitamins-Minerals (MULTIVITAMIN WITH MINERALS) tablet Take 1 tablet by mouth daily.   Yes [provider]  olmesartan (BENICAR) 40 MG tablet Take 40 mg by mouth daily. 10/11/19  Yes [provider]  pantoprazole (PROTONIX) 20 MG tablet Take 1 tablet (20 mg total) by mouth daily for 14 days. 07/19/23 08/02/23 Yes Tanda Rockers A, DO  Semaglutide,0.25 or 0.5MG /DOS, (OZEMPIC, 0.25 OR 0.5 MG/DOSE,) 2 MG/1.5ML SOPN Inject 2 mg into the skin once a week.   Yes [provider]    Current Facility-Administered Medications  Medication Dose Route Frequency Provider Last Rate Last Admin   acetaminophen (TYLENOL) tablet 650 mg  650 mg Oral Q6H PRN Arrien, York Ram, MD       Or   acetaminophen (TYLENOL) suppository 650 mg  650 mg Rectal Q6H PRN Arrien, York Ram, MD       atorvastatin (LIPITOR) tablet 10 mg  10 mg Oral Daily Arrien, York Ram, MD   10 mg at 07/19/23 1635   carvedilol (COREG) tablet 25 mg  25 mg Oral BID Arrien, York Ram, MD       feeding supplement (BOOST / RESOURCE BREEZE) liquid 1 Container  1 Container Oral TID BM Arrien, York Ram, MD   1 Container at 07/19/23 2025   insulin aspart (novoLOG) injection 0-9 Units  0-9 Units Subcutaneous TID WC Arrien, York Ram, MD       irbesartan (AVAPRO) tablet 37.5 mg  37.5 mg Oral Daily Arrien,  York Ram, MD       lactated ringers infusion   Intravenous Continuous Arrien, York Ram, MD 75 mL/hr at 07/20/23 2952 Restarted at 07/20/23 0702   multivitamin with minerals tablet 1 tablet  1 tablet Oral Daily Arrien, York Ram, MD       ondansetron [facility]) tablet 4 mg  4 mg Oral Q6H PRN Arrien, York Ram, MD       Or   ondansetron [facility]) injection 4 mg  4 mg Intravenous Q6H PRN Arrien, York Ram, MD   4 mg at 07/19/23 2017   pantoprazole (PROTONIX) EC tablet 40 mg  40 mg Oral Daily Arrien, York Ram, MD   [phone]  mg at 07/19/23 1635    Allergies as of 07/19/2023 - Review Complete 07/19/2023  Allergen Reaction Noted   Norvasc [amlodipine besylate] Swelling 02/22/2012   Nsaids Other (See Comments) 08/07/2015   Sulfa antibiotics Other (See Comments) 02/22/2012    Family History  Problem Relation Age of Onset   Hypertension Mother    Cancer Mother        ?uterine or ovarian cancer   Hypertension Father    Diabetes Father    Hypertension Sister    Diabetes Sister    Hypertension Brother    Diabetes Brother    Hypertension Maternal Grandfather    Diabetes Paternal Grandmother    Breast cancer Maternal Grandmother    Colon cancer Neg Hx     Social History   Socioeconomic History   Marital status: Single    Spouse name: N/A   Number of children: 0   Years of education: Not on file   Highest education level: Not on file  Occupational History   Occupation: NURSE    Comment: [employer]  Tobacco Use   Smoking status: Never   Smokeless tobacco: Never  Vaping Use   Vaping status: Never Used  Substance and Sexual Activity   Alcohol use: No   Drug use: No   Sexual activity: Not Currently    Birth control/protection: Post-menopausal  Other Topics Concern   Not on file  Social History Narrative   Lives alone. Parents live in [place].   Social Determinants of Health   Financial Resource Strain: Not on file  Food Insecurity: No Food  Insecurity (07/19/2023)   Hunger Vital Sign    Worried About Running Out of Food in the Last Year: Never true    Ran Out of Food in the Last Year: Never true  Transportation Needs: No Transportation Needs (07/19/2023)   PRAPARE - Administrator, Civil Service (Medical): No    Lack of Transportation (Non-Medical): No  Physical Activity: Not on file  Stress: Not on file  Social Connections: Not on file  Intimate Partner Violence: Not At Risk (07/19/2023)   Humiliation, Afraid, Rape, and Kick questionnaire    Fear of Current or Ex-Partner: No    Emotionally Abused: No    Physically Abused: No    Sexually Abused: No    Review of Systems: Gen: Denies fever, sweats or chills. No weight loss.  CV: Denies chest pain, palpitations or edema. Resp: Denies cough, shortness of breath of hemoptysis.  GI: See HPI. GU : Denies urinary burning, blood in urine, increased urinary frequency or incontinence. MS: Denies joint pain, muscles aches or weakness. Derm: Denies rash, itchiness, skin lesions or unhealing ulcers. Psych: Denies depression, anxiety, memory loss or confusion. Heme: Denies easy bruising, bleeding. Neuro:  Denies headaches, dizziness or paresthesias. Endo:  Denies any problems with DM, thyroid or adrenal function.  Physical Exam: Vital signs in last 24 hours: Temp:  [98.1 F (36.7 C)-98.9 F (37.2 C)] 98.6 F (37 C) (09/03 0649) Pulse Rate:  [72-84] 76 (09/03 0649) Resp:  [16-20] 18 (09/03 0649) BP: (89-147)/(53-83) 122/62 (09/03 0649) SpO2:  [96 %-100 %] 100 % (09/03 0649) Weight:  [105.7 kg] 105.7 kg (09/02 1025) Last BM Date : 07/19/23 General:  Alert 62 year old female in no acute distress. Head:  Normocephalic and atraumatic. Eyes:  No scleral icterus. Conjunctiva pink. Ears:  Normal auditory acuity. Nose:  No deformity, discharge or lesions. Mouth:  Dentition intact. No ulcers or lesions.  Neck:  Supple.  No lymphadenopathy or thyromegaly.  Lungs: Breath  sounds clear throughout. No wheezes, rhonchi or crackles.  Heart: No murmurs. Abdomen:, Nondistended.  Mild LUQ tenderness without rebound or guarding.  Positive bowel sounds to all 4 quadrants.  No palpable mass.  No hepatosplenomegaly.  No bruit. Rectal: Deferred. Musculoskeletal:  Symmetrical without gross deformities.  Pulses:  Normal pulses noted. Extremities:  Without clubbing or edema. Neurologic:  Alert and  oriented x 4. No focal deficits.  Skin:  Intact without significant lesions or rashes. Psych:  Alert and cooperative. Normal mood and affect.  Intake/Output from previous day: 09/02 0701 - 09/03 0700 In: 2145.1 [P.O.:180; I.V.:1264.1; Blood:701] Out: -  Intake/Output this shift: No intake/output data recorded.  Lab Results: Recent Labs    07/19/23 1052 07/20/23 0107  WBC 7.4 9.7  HGB 9.2* 6.6*  HCT 30.0* 21.3*  PLT 408* 309   BMET Recent Labs    07/19/23 1052 07/20/23 0107  NA 135 140  K 3.2* 3.9  CL 101 105  CO2 26 25  GLUCOSE 91 107*  BUN 6* 9  CREATININE 0.76 0.76  CALCIUM 8.3* 8.2*   LFT Recent Labs    07/19/23 1052  PROT 6.2*  ALBUMIN 3.0*  AST 14*  ALT 13  ALKPHOS 48  BILITOT 0.8   PT/INR Recent Labs    07/19/23 1052  LABPROT 12.8  INR 0.9   Hepatitis Panel No results for input(s): "HEPBSAG", "HCVAB", "HEPAIGM", "HEPBIGM" in the last 72 hours.    Studies/Results: CT ANGIO GI BLEED  Result Date: 07/20/2023 CLINICAL DATA:  Ongoing bloody bowel movements. EXAM: CTA ABDOMEN AND PELVIS WITHOUT AND WITH CONTRAST TECHNIQUE: Multidetector CT imaging of the abdomen and pelvis was performed using the standard protocol during bolus administration of intravenous contrast. Multiplanar reconstructed images and MIPs were obtained and reviewed to evaluate the vascular anatomy. RADIATION DOSE REDUCTION: This exam was performed according to the departmental dose-optimization program which includes automated exposure control, adjustment of the mA  and/or kV according to patient size and/or use of iterative reconstruction technique. CONTRAST:  OMNIPAQUE IOHEXOL 350 MG/ML SOLN COMPARISON:  Multiple prior CTs back to 2016. The 2 most recent are CT with IV contrast yesterday at 12:02 p.m., CTA abdomen and pelvis GI bleed protocol 07/09/2023. FINDINGS: VASCULAR Aorta: Normal caliber aorta without aneurysm, dissection, vasculitis or significant stenosis. Minimal scattered calcific plaques. Celiac: Normal.  No branch occlusions. SMA: Normal. No branch occlusions. Widely patent replaced right hepatic artery arises at the vessel inflection point. Renals: Both renal arteries are patent without evidence of aneurysm, dissection, vasculitis, fibromuscular dysplasia or significant stenosis. IMA: Normal.  No branch occlusions. Inflow: Patent without evidence of aneurysm, dissection, vasculitis or significant stenosis. Proximal Outflow: Bilateral common femoral and visualized portions of the superficial and profunda femoral arteries are patent without evidence of aneurysm, dissection, vasculitis or significant stenosis. Veins: Patent.  No portal vein dilatation. Review of the MIP images confirms the above findings. NON-VASCULAR Lower chest: Lung bases are clear of infiltrates. The heart is slightly enlarged. On the noncontrasted exam the intraventricular blood is lower in density than the myocardium. Findings consistent with anemia. Hepatobiliary: The liver is mildly steatotic. There is mild irregularity in the contour of the left lobe which may be seen with early cirrhosis. Gallbladder is absent without biliary dilatation. Pancreas: No abnormality. Spleen: No abnormality.  No splenomegaly. Adrenals/Urinary Tract: Status post right adrenalectomy. Normal left adrenal. Bilateral Bosniak 1 and 2 renal cysts are redemonstrated. No follow-up imaging is recommended.  There is no mass enhancement, no urinary stone or obstruction. There is contrast in the bladder. There is no  bladder thickening. Stomach/Bowel: There is no visible arterial or venous contrast extravasation into the GI tract. The unopacified stomach and small bowel are unremarkable. The appendix is normal. There is diffuse colonic diverticulosis without evidence of an acute diverticulitis. Lymphatic: No lymphadenopathy is seen. Reproductive: Enlarged lobular fibroid uterus with multiple calcified and partially calcified fibroids. No adnexal mass is seen. Other: Small umbilical and inguinal fat hernias. No incarcerated hernias. No free fluid, free hemorrhage or free air. Musculoskeletal: L4-5 with advanced facet hypertrophy, minimal grade 1 anterolisthesis and moderate acquired spinal stenosis. Chronic sacroiliitis. Additional degenerative change at L5-S1. Degenerative discs lower thoracic spine. No acute or significant osseous findings. IMPRESSION: 1. No visible arterial or venous contrast extravasation into the GI tract. 2. Minimal aortic atherosclerosis. No visceral artery stenoses or branch occlusions. 3. Diffuse colonic diverticulosis without evidence of acute diverticulitis. 4. Mild hepatic steatosis with mild irregularity in the left lobe which may be seen with early cirrhosis. No splenomegaly or ascites. 5. Fibroid uterus. 6. Umbilical and inguinal fat hernias. 7. Likely anemia. 8. Additional findings described above. Aortic Atherosclerosis (ICD10-I70.0). Electronically Signed   By: Almira Bar M.D.   On: 07/20/2023 01:22   CT ABDOMEN PELVIS W CONTRAST  Result Date: 07/19/2023 CLINICAL DATA:  Left lower quadrant abdominal pain. EXAM: CT ABDOMEN AND PELVIS WITH CONTRAST TECHNIQUE: Multidetector CT imaging of the abdomen and pelvis was performed using the standard protocol following bolus administration of intravenous contrast. RADIATION DOSE REDUCTION: This exam was performed according to the departmental dose-optimization program which includes automated exposure control, adjustment of the mA and/or kV  according to patient size and/or use of iterative reconstruction technique. CONTRAST:  75mL OMNIPAQUE IOHEXOL 350 MG/ML SOLN COMPARISON:  Abdominal CT 07/09/2023 and 07/07/2023. FINDINGS: Lower chest: The visualized lower chest appears unchanged, without acute or significant findings. Hepatobiliary: The liver is normal in density without suspicious focal abnormality. No evidence of biliary dilatation status post cholecystectomy. Pancreas: Unremarkable. No pancreatic ductal dilatation or surrounding inflammatory changes. Spleen: Normal in size without focal abnormality. 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 The left adrenal gland appears normal. No evidence of urinary tract calculus, suspicious renal lesion or hydronephrosis. Simple and mildly complex renal cysts are noted bilaterally which are similar to the previous study. In the upper pole of the left kidney, there is a 1.6 cm lesion with a density of 61 HU, and in the interpolar region of the left kidney, there is a 1.2 cm lesion with a density of 42 HU. These did not appear to be enhancing on the most recent CT which was performed without and with contrast and are consistent with Bosniak 2 cysts. No specific follow-up imaging recommended. The bladder appears unremarkable for its degree of distention. Stomach/Bowel: No enteric contrast administered. The stomach appears unremarkable for its degree of distension. No evidence of bowel wall thickening, distention or surrounding inflammatory change. The appendix appears normal. There are diverticular changes throughout the colon. Vascular/Lymphatic: There are no enlarged abdominal or pelvic lymph nodes. Minimal atherosclerosis. No acute vascular findings or aneurysm. Reproductive: Numerous calcified and exophytic uterine fibroids are grossly stable. No suspicious adnexal findings. Other: Stable small periumbilical hernia containing only fat. No ascites or  pneumoperitoneum. Musculoskeletal: No acute or significant osseous findings. Multilevel lumbar spondylosis. Unless specific follow-up recommendations are mentioned in the findings or impression sections, no imaging follow-up of any mentioned  incidental findings is recommended. IMPRESSION: 1. No acute findings or explanation for the patient's symptoms. 2. Diffuse colonic diverticulosis without evidence of acute inflammation. 3. 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 4. Stable uterine fibroids. Electronically Signed   By: Carey Bullocks M.D.   On: 07/19/2023 12:32    IMPRESSION/PLAN:  62 year old female with a history of pandiverticulosis and diverticular bleed re-admitted to the hospital with hematochezia and anemia, suspect diverticular bleed. Hg 9.2 up from Hg 8.2 at time of prior hospital discharge 07/15/2023. Hg level dropped to 6.6 this morning -> transfused one unit of PRBCs -> post transfusion H/H pending.  CTA without evidence of active GI bleeding.  Last episode of hematochezia was around 8 PM last night without recurrence thus far this morning. -Clear liquid diet -NPO after midnight -IV fluids per the hospitalist -Colonoscopy tomorrow with Dr. Adela Lank, benefits and risks discussed including risk with sedation, risk of bleeding, perforation and infection  -Check H&H posttransfusion every 6 hours x 24 hours -Transfuse for hemoglobin less than 7 or as needed if symptomatic  -GERD, LUQ pain -Continue Pantoprazole 40 mg p.o. daily -EGD deferred for now  History of colon polyps  Diabetes mellitus type 2  Cushing's disease, past right adrenalectomy.   Malachi Carl Kennedy-Smith  07/20/2023, 8:50AM

## 2023-07-20 NOTE — Progress Notes (Signed)
PROGRESS NOTE    Carol Mueller  GMW:[id number] DOB: 19-Feb-1961 DOA: 07/19/2023 PCP: [name], DO   Brief Narrative:  62 year old with history of colonic diverticulosis, HTN, DM2, obesity admitted for bright red blood per rectum.  Had similar presentation 8/21 - 8/29 for colonic diverticular bleed requiring 3 units PRBC.  CT angio was negative for active bleeding and it subsided spontaneously.  GI had recommended Pepcid p.o. twice daily for 6-8 weeks and outpatient follow-up.   Assessment & Plan:  Active Problems:   Acute lower GI bleeding   Hypokalemia   Essential hypertension   Type 2 diabetes mellitus with hyperlipidemia (HCC)   Class 3 obesity (HCC)   Hematochezia Recurrent diverticular bleed Acute blood loss anemia - Concerns of recurrent diverticular bleed.  Admitted for similar issue about of a week ago.  Seen by GI, symptoms resolved spontaneously.  Initial CT abdomen pelvis with contrast followed by CT angio were both negative for acute and active bleed.  Recommendations were to keep her on Pepcid 20 mg twice daily over next 6-8 weeks and avoid NSAIDs/aspirin.  [facility] consulted, planning colonoscopy tomorrow -Currently on IV fluids, monitoring H&H, clear liquid diet -Hemoglobin 6.6.  PRBC transfusion  Hypokalemia/hypomagnesemia -Replete as needed  Essential hypertension -Coreg, Avapro.  IV as needed  Diabetes mellitus type 2 -Sliding scale and Accu-Cheks -Low-dose statin  History of Cushing's disease status post right adrenalectomy -Supportive care  DVT prophylaxis: SCDs Start: 07/19/23 1614 Code Status: Full Family Communication:   Continue hospital stay for colonoscopy tomorrow    Subjective: Seen at bedside, no bowel movement in last 10 hours.   Examination:  General exam: Appears calm and comfortable  Respiratory system: Clear to auscultation. Respiratory effort normal. Cardiovascular system: S1 & S2 heard, RRR. No JVD, murmurs, rubs, gallops or  clicks. No pedal edema. Gastrointestinal system: Abdomen is nondistended, soft and nontender. No organomegaly or masses felt. Normal bowel sounds heard. Central nervous system: Alert and oriented. No focal neurological deficits. Extremities: Symmetric 5 x 5 power. Skin: No rashes, lesions or ulcers Psychiatry: Judgement and insight appear normal. Mood & affect appropriate.      Diet Orders (From admission, onward)     Start     Ordered   07/19/23 1614  Diet clear liquid Room service appropriate? Yes; Fluid consistency: Thin  Diet effective now       Question Answer Comment  Room service appropriate? Yes   Fluid consistency: Thin      07/19/23 1613            Objective: Vitals:   07/20/23 0313 07/20/23 0336 07/20/23 0351 07/20/23 0649  BP: (!) 89/63  (!) 101/53 122/62  Pulse: 80  82 76  Resp:  16 17 18   Temp: 98.4 F (36.9 C)  98.7 F (37.1 C) 98.6 F (37 C)  TempSrc: Oral  Oral Oral  SpO2: 96%  96% 100%  Weight:      Height:        Intake/Output Summary (Last 24 hours) at 07/20/2023 0745 Last data filed at 07/20/2023 0649 Gross per 24 hour  Intake 2145.1 ml  Output --  Net 2145.1 ml   Filed Weights   07/19/23 1025  Weight: 105.7 kg    Scheduled Meds:  atorvastatin  10 mg Oral Daily   carvedilol  25 mg Oral BID   feeding supplement  1 Container Oral TID BM   insulin aspart  0-9 Units Subcutaneous TID WC   irbesartan  37.5  mg Oral Daily   multivitamin with minerals  1 tablet Oral Daily   pantoprazole  40 mg Oral Daily   Continuous Infusions:  lactated ringers 75 mL/hr at 07/20/23 0702    Nutritional status     Body mass index is 41.28 kg/m.  Data Reviewed:   CBC: Recent Labs  Lab 07/14/23 0443 07/14/23 1924 07/15/23 0437 07/19/23 1052 07/20/23 0107  WBC 9.4  --  9.9 7.4 9.7  NEUTROABS 5.4  --   --  4.7  --   HGB 7.5* 8.6* 8.2* 9.2* 6.6*  HCT 24.2* 27.9* 25.8* 30.0* 21.3*  MCV 76.1*  --  76.1* 78.5* 76.3*  PLT 279  --  307 408* 309    Basic Metabolic Panel: Recent Labs  Lab 07/15/23 0437 07/19/23 1052 07/20/23 0107  NA  --  135 140  K 3.3* 3.2* 3.9  CL  --  101 105  CO2  --  26 25  GLUCOSE  --  91 107*  BUN  --  6* 9  CREATININE  --  0.76 0.76  CALCIUM 8.6* 8.3* 8.2*  MG 1.7  --  1.5*   GFR: Estimated Creatinine Clearance: 83.7 mL/min (by C-G formula based on SCr of 0.76 mg/dL). Liver Function Tests: Recent Labs  Lab 07/19/23 1052  AST 14*  ALT 13  ALKPHOS 48  BILITOT 0.8  PROT 6.2*  ALBUMIN 3.0*   Recent Labs  Lab 07/19/23 1052  LIPASE 26   No results for input(s): "AMMONIA" in the last 168 hours. Coagulation Profile: Recent Labs  Lab 07/19/23 1052  INR 0.9   Cardiac Enzymes: No results for input(s): "CKTOTAL", "CKMB", "CKMBINDEX", "TROPONINI" in the last 168 hours. BNP (last 3 results) No results for input(s): "PROBNP" in the last 8760 hours. HbA1C: No results for input(s): "HGBA1C" in the last 72 hours. CBG: Recent Labs  Lab 07/15/23 0605 07/15/23 0936 07/19/23 1615 07/19/23 1938 07/20/23 0635  GLUCAP 127* 116* 80 108* 86   Lipid Profile: No results for input(s): "CHOL", "HDL", "LDLCALC", "TRIG", "CHOLHDL", "LDLDIRECT" in the last 72 hours. Thyroid Function Tests: No results for input(s): "TSH", "T4TOTAL", "FREET4", "T3FREE", "THYROIDAB" in the last 72 hours. Anemia Panel: No results for input(s): "VITAMINB12", "FOLATE", "FERRITIN", "TIBC", "IRON", "RETICCTPCT" in the last 72 hours. Sepsis Labs: No results for input(s): "PROCALCITON", "LATICACIDVEN" in the last 168 hours.  No results found for this or any previous visit (from the past 240 hour(s)).       Radiology Studies: CT ANGIO GI BLEED  Result Date: 07/20/2023 CLINICAL DATA:  Ongoing bloody bowel movements. EXAM: CTA ABDOMEN AND PELVIS WITHOUT AND WITH CONTRAST TECHNIQUE: Multidetector CT imaging of the abdomen and pelvis was performed using the standard protocol during bolus administration of intravenous  contrast. Multiplanar reconstructed images and MIPs were obtained and reviewed to evaluate the vascular anatomy. RADIATION DOSE REDUCTION: This exam was performed according to the departmental dose-optimization program which includes automated exposure control, adjustment of the mA and/or kV according to patient size and/or use of iterative reconstruction technique. CONTRAST:  OMNIPAQUE IOHEXOL 350 MG/ML SOLN COMPARISON:  Multiple prior CTs back to 2016. The 2 most recent are CT with IV contrast yesterday at 12:02 p.m., CTA abdomen and pelvis GI bleed protocol 07/09/2023. FINDINGS: VASCULAR Aorta: Normal caliber aorta without aneurysm, dissection, vasculitis or significant stenosis. Minimal scattered calcific plaques. Celiac: Normal.  No branch occlusions. SMA: Normal. No branch occlusions. Widely patent replaced right hepatic artery arises at the vessel inflection point. Renals:  Both renal arteries are patent without evidence of aneurysm, dissection, vasculitis, fibromuscular dysplasia or significant stenosis. IMA: Normal.  No branch occlusions. Inflow: Patent without evidence of aneurysm, dissection, vasculitis or significant stenosis. Proximal Outflow: Bilateral common femoral and visualized portions of the superficial and profunda femoral arteries are patent without evidence of aneurysm, dissection, vasculitis or significant stenosis. Veins: Patent.  No portal vein dilatation. Review of the MIP images confirms the above findings. NON-VASCULAR Lower chest: Lung bases are clear of infiltrates. The heart is slightly enlarged. On the noncontrasted exam the intraventricular blood is lower in density than the myocardium. Findings consistent with anemia. Hepatobiliary: The liver is mildly steatotic. There is mild irregularity in the contour of the left lobe which may be seen with early cirrhosis. Gallbladder is absent without biliary dilatation. Pancreas: No abnormality. Spleen: No abnormality.  No splenomegaly.  Adrenals/Urinary Tract: Status post right adrenalectomy. Normal left adrenal. Bilateral Bosniak 1 and 2 renal cysts are redemonstrated. No follow-up imaging is recommended. There is no mass enhancement, no urinary stone or obstruction. There is contrast in the bladder. There is no bladder thickening. Stomach/Bowel: There is no visible arterial or venous contrast extravasation into the GI tract. The unopacified stomach and small bowel are unremarkable. The appendix is normal. There is diffuse colonic diverticulosis without evidence of an acute diverticulitis. Lymphatic: No lymphadenopathy is seen. Reproductive: Enlarged lobular fibroid uterus with multiple calcified and partially calcified fibroids. No adnexal mass is seen. Other: Small umbilical and inguinal fat hernias. No incarcerated hernias. No free fluid, free hemorrhage or free air. Musculoskeletal: L4-5 with advanced facet hypertrophy, minimal grade 1 anterolisthesis and moderate acquired spinal stenosis. Chronic sacroiliitis. Additional degenerative change at L5-S1. Degenerative discs lower thoracic spine. No acute or significant osseous findings. IMPRESSION: 1. No visible arterial or venous contrast extravasation into the GI tract. 2. Minimal aortic atherosclerosis. No visceral artery stenoses or branch occlusions. 3. Diffuse colonic diverticulosis without evidence of acute diverticulitis. 4. Mild hepatic steatosis with mild irregularity in the left lobe which may be seen with early cirrhosis. No splenomegaly or ascites. 5. Fibroid uterus. 6. Umbilical and inguinal fat hernias. 7. Likely anemia. 8. Additional findings described above. Aortic Atherosclerosis (ICD10-I70.0). Electronically Signed   By: Almira Bar M.D.   On: 07/20/2023 01:22   CT ABDOMEN PELVIS W CONTRAST  Result Date: 07/19/2023 CLINICAL DATA:  Left lower quadrant abdominal pain. EXAM: CT ABDOMEN AND PELVIS WITH CONTRAST TECHNIQUE: Multidetector CT imaging of the abdomen and pelvis was  performed using the standard protocol following bolus administration of intravenous contrast. RADIATION DOSE REDUCTION: This exam was performed according to the departmental dose-optimization program which includes automated exposure control, adjustment of the mA and/or kV according to patient size and/or use of iterative reconstruction technique. CONTRAST:  75mL OMNIPAQUE IOHEXOL 350 MG/ML SOLN COMPARISON:  Abdominal CT 07/09/2023 and 07/07/2023. FINDINGS: Lower chest: The visualized lower chest appears unchanged, without acute or significant findings. Hepatobiliary: The liver is normal in density without suspicious focal abnormality. No evidence of biliary dilatation status post cholecystectomy. Pancreas: Unremarkable. No pancreatic ductal dilatation or surrounding inflammatory changes. Spleen: Normal in size without focal abnormality. 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 The left adrenal gland appears normal. No evidence of urinary tract calculus, suspicious renal lesion or hydronephrosis. Simple and mildly complex renal cysts are noted bilaterally which are similar to the previous study. In the upper pole of the left kidney, there is a 1.6 cm lesion with a density of 61 HU, and  in the interpolar region of the left kidney, there is a 1.2 cm lesion with a density of 42 HU. These did not appear to be enhancing on the most recent CT which was performed without and with contrast and are consistent with Bosniak 2 cysts. No specific follow-up imaging recommended. The bladder appears unremarkable for its degree of distention. Stomach/Bowel: No enteric contrast administered. The stomach appears unremarkable for its degree of distension. No evidence of bowel wall thickening, distention or surrounding inflammatory change. The appendix appears normal. There are diverticular changes throughout the colon. Vascular/Lymphatic: There are no enlarged abdominal or pelvic lymph  nodes. Minimal atherosclerosis. No acute vascular findings or aneurysm. Reproductive: Numerous calcified and exophytic uterine fibroids are grossly stable. No suspicious adnexal findings. Other: Stable small periumbilical hernia containing only fat. No ascites or pneumoperitoneum. Musculoskeletal: No acute or significant osseous findings. Multilevel lumbar spondylosis. Unless specific follow-up recommendations are mentioned in the findings or impression sections, no imaging follow-up of any mentioned incidental findings is recommended. IMPRESSION: 1. No acute findings or explanation for the patient's symptoms. 2. Diffuse colonic diverticulosis without evidence of acute inflammation. 3. 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 4. Stable uterine fibroids. Electronically Signed   By: Carey Bullocks M.D.   On: 07/19/2023 12:32           LOS: 0 days   Time spent= 35 mins    Miguel Rota, MD [facility]  If 7PM-7AM, please contact night-coverage  07/20/2023, 7:45 AM

## 2023-07-20 NOTE — Evaluation (Signed)
Physical Therapy Evaluation Patient Details Name: Carol Mueller MRN: [id number] DOB: 1960-12-10 Today's Date: 07/20/2023  History of Present Illness  62 y.o. female presents to [facility] hospital on 07/19/2023 with bright red blood per rectum. Pt with recent hospitalization 8/21-8/29 with colonic diverticular bleed. PMH includes cushing's syndrome, DM, HLD, HTN, polycystic ovary disease, sepsis.  Clinical Impression  Pt presents to PT with deficits in activity tolerance and gait. Pt is able to ambulate for household distances, utilizing UE support of IV pole. PT encourages frequent mobilization in an effort to improve activity tolerance. PT provides cues to sit if symptomatic with initial standing or when ambulating. PT recommends discharge home, no post-acute PT services or DME. PT would anticipate pt progressing back to baseline if hemoglobin continues to improve.        If plan is discharge home, recommend the following: Assistance with cooking/housework   Can travel by private vehicle        Equipment Recommendations None recommended by PT  Recommendations for Other Services       Functional Status Assessment Patient has had a recent decline in their functional status and demonstrates the ability to make significant improvements in function in a reasonable and predictable amount of time.     Precautions / Restrictions Precautions Precautions: None Restrictions Weight Bearing Restrictions: No      Mobility  Bed Mobility Overal bed mobility: Independent                  Transfers Overall transfer level: Independent Equipment used: None                    Ambulation/Gait Ambulation/Gait assistance: Modified independent (Device/Increase time) Gait Distance (Feet): 150 Feet (150' x 2 trials) Assistive device: IV Pole Gait Pattern/deviations: Step-through pattern Gait velocity: functional Gait velocity interpretation: 1.31 - 2.62 ft/sec, indicative of limited  community ambulator   General Gait Details: slowed step-through gait  Stairs            Wheelchair Mobility     Tilt Bed    Modified Rankin (Stroke Patients Only)       Balance Overall balance assessment: Needs assistance Sitting-balance support: No upper extremity supported, Feet supported Sitting balance-Leahy Scale: Good     Standing balance support: No upper extremity supported, During functional activity Standing balance-Leahy Scale: Good                               Pertinent Vitals/Pain Pain Assessment Pain Assessment: No/denies pain    Home Living Family/patient expects to be discharged to:: Private residence Living Arrangements: Alone Available Help at Discharge: Family Type of Home: House Home Access: Stairs to enter Entrance Stairs-Rails: Can reach both Entrance Stairs-Number of Steps: 5   Home Layout: One level Home Equipment: None      Prior Function Prior Level of Function : Independent/Modified Independent             Mobility Comments: ambulatory without DME       Extremity/Trunk Assessment   Upper Extremity Assessment Upper Extremity Assessment: Overall WFL for tasks assessed    Lower Extremity Assessment Lower Extremity Assessment: Overall WFL for tasks assessed    Cervical / Trunk Assessment Cervical / Trunk Assessment: Normal  Communication   Communication Communication: No apparent difficulties Cueing Techniques: Verbal cues  Cognition Arousal: Alert Behavior During Therapy: WFL for tasks assessed/performed Overall Cognitive Status: Within Functional Limits for  tasks assessed                                          General Comments General comments (skin integrity, edema, etc.): VSS on RA, pt reports mild lightheadedness with initial ambulation although not requiring standing break of seated break due to symptoms    Exercises     Assessment/Plan    PT Assessment Patient needs  continued PT services  PT Problem List Decreased activity tolerance;Decreased balance;Decreased mobility       PT Treatment Interventions Gait training;Balance training;Patient/family education    PT Goals (Current goals can be found in the Care Plan section)  Acute Rehab PT Goals Patient Stated Goal: to return to independence, improve endurance PT Goal Formulation: With patient Time For Goal Achievement: 08/03/23 Potential to Achieve Goals: Good Additional Goals Additional Goal #1: Pt will score >19/24 on the DGI to indicate a reduced risk for falls    Frequency Min 1X/week     Co-evaluation               AM-PAC PT "6 Clicks" Mobility  Outcome Measure Help needed turning from your back to your side while in a flat bed without using bedrails?: None Help needed moving from lying on your back to sitting on the side of a flat bed without using bedrails?: None Help needed moving to and from a bed to a chair (including a wheelchair)?: None Help needed standing up from a chair using your arms (e.g., wheelchair or bedside chair)?: None Help needed to walk in hospital room?: None Help needed climbing 3-5 steps with a railing? : None 6 Click Score: 24    End of Session   Activity Tolerance: Patient tolerated treatment well Patient left: in bed;with call bell/phone within reach Nurse Communication: Mobility status PT Visit Diagnosis: Other abnormalities of gait and mobility (R26.89)    Time: 1006-1020 PT Time Calculation (min) (ACUTE ONLY): 14 min   Charges:   PT Evaluation $PT Eval Low Complexity: 1 Low   PT General Charges $$ ACUTE PT VISIT: 1 Visit         Arlyss Gandy, PT, DPT Acute Rehabilitation Office [phone]   Arlyss Gandy 07/20/2023, 10:52 AM

## 2023-07-20 NOTE — Plan of Care (Signed)

## 2023-07-20 NOTE — Consult Note (Addendum)
Referring Provider: Dr. Stephania Fragmin Primary Care Physician:  [name], DO Primary Gastroenterologist:  Dr. Christella Hartigan, Dr. Barron Alvine   Reason for Consultation: GI bleed, anemia  HPI: Carol Mueller is a 62 y.o. female with a past medical history of hypertension, hyperlipidemia, type II, Cushing's syndrome, colon polyps and pandiverticulosis with recurrent diverticular bleed. Past cholecystectomy, adrenalectomy and partial hysterectomy.  She was recently admitted to the hospital 8/21 - 07/15/2023 secondary to passing dark red hematochezia. CTA 8/21 and 07/09/2023 were negative for active GI bleeding. Admission hemoglobin level was 13 which dropped to 6.7 and she was transfused 3 units of PRBCs. She noted having 1 episode of black tarry stool during this hospital admission without recurrence. She was seen by our inpatient GI service and endoscopic intervention was not required. Her hemoglobin stabilized without further active GI bleeding and she was discharged home and she was scheduled for a follow-up appointment in our office today.  She developed painless bright red hematochezia yesterday and presented to the ED for further evaluation as she was concerned she was having recurrent diverticular bleeding. Labs in the ED showed a hemoglobin level of 9.2 ( Hg level was 8.2 on 07/15/2023 when she was discharged from the hospital as noted above). Hematocrit 30.  Platelet 408.  BUN 6.  Creatinine 0.76.  Potassium 3.2.  Normal LFTs.  Lipase 26.  INR 0.9.  HIV nonreactive.  Her hemoglobin level dropped to 6.6 at 1 AM today.  She was transfused 1 unit PRBCs, posttransfusion H&H not yet collected.  Abdominal/pelvic CTA was negative for active GI bleeding.  She endorsed being somewhat constipated following her hospital discharge 07/15/2023.  She partially self disimpacted a small amount of stool then passed a large nonbloody formed stool. She passed normal bowel movements for the next 2 days.  Sunday 9/1 she  passed a formed Agudelo-red stool.  Monday 9/2 she passed 3 bright red bloody bowel movements and presented to the ED.  She had 3 episodes of bright red hematochezia in the ED yesterday and 5 bright red bloody bowel movements yesterday since being on the medical unit from 4pm to 8 pm.  She denies having any further hematochezia overnight or thus far this morning.  No NSAID use.  She has intermittent heartburn at home, takes Nexium OTC or Mylanta for 1 week at a time as needed.  She has intermittent LUQ pain for the past few months which is sometimes worse after eating. No further black stools since her hospital admission 06/2023 as noted above. She had some shortness of breath yesterday which abated.  No chest pain or dizziness.  Her sister is at the bedside.  Her most recent endoscopic procedures were 11/2020 during hospital admission for GI bleed.  EGD, colonoscopy and VCE findings did not identify the etiology for her GI bleed.  Suspected recurrent GI bleed.  Note, prior hospital admission 11/2020 for suspected GI bleed showed a negative CTA and tagged red blood cell scan.   GI PROCEDURES:  Small bowel capsule endoscopy 12/03/2020: Small area of focal erythema versus subtle nonbleeding erosion in the distal small bowel. This appears to be an incidental finding and not likely to be of clinical consequence and unlikely to be source of recurrent bleeding. This would be out of reach of a single balloon enteroscopy and do not feel that referral for retrograde double-balloon enteroscopy is necessary. Otherwise completely normal study.  No blood noted throughout the small bowel lumen and visualized colon. Based on these  findings and recent extensive evaluation favor recurrent diverticular bleed.  EGD 11/28/2020: - Ectopic gastric mucosa in the upper third of the esophagus. - Normal none none none - Normal esophagus. - A few gastric polyps. - Normal incisura, antrum and pylorus. - Normal duodenal bulb, first  portion of the duodenum and second portion of the duodenum. - Successful completion of the Video Capsule Enteroscope placement. - No specimens collected.   Colonoscopy 11/22/2020: - One 9 mm polyp at the appendiceal orifice, removed with a cold snare. Resected and retrieved. - One 6 mm polyp in the transverse colon, removed with a cold snare. Resected and retrieved. - Internal hemorrhoids. - Moderate diverticulosis in the entire examined colon. There was evidence of an impacted diverticulum. There was no evidence of diverticular bleeding. No blood in the colon. - The exam was otherwise normal on direct and retroflexion views. Biopsy Report: A. COLON, CECAL POLYP, POLYPECTOMY:  - Benign colonic mucosa with lymphoid aggregate.  - No dysplasia or malignancy.   B. COLON, TRANSVERSE POLYP, POLYECTOMY:  - Hyperplastic polyp.  - No dysplasia or malignancy.    Colonoscopy 10/03/2019: - Two 6 to 8 mm polyps in the descending colon and in the transverse colon, removed with a cold snare. Resected and retrieved.  - Mild diverticulosis in the right colon. There was no evidence of diverticular bleeding.  - Moderate diverticulosis in the left colon. There was no evidence of diverticular bleeding.  - Internal hemorrhoids.  - The examination was otherwise normal on direct and retroflexion views.  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   Diabetes mellitus without complication (HCC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   Diverticulitis    Diverticulosis    GI bleed    Hyperlipidemia    Hypertension    Polycystic ovary disease    Sepsis (HCC) 05/2019    Past Surgical History:  Procedure Laterality Date   ADRENALECTOMY  1996   BREAST BIOPSY Right 2018   BREAST BIOPSY Left    x2   CHOLECYSTECTOMY     COLONOSCOPY WITH PROPOFOL N/A 10/03/2019   Procedure: COLONOSCOPY WITH PROPOFOL;  Surgeon: Meryl Dare, MD;  Location: [facility];   Service: Endoscopy;  Laterality: N/A;   COLONOSCOPY WITH PROPOFOL N/A 11/22/2020   Procedure: COLONOSCOPY WITH PROPOFOL;  Surgeon: Meryl Dare, MD;  Location: [facility];  Service: Endoscopy;  Laterality: N/A;   ESOPHAGOGASTRODUODENOSCOPY (EGD) WITH PROPOFOL N/A 07/16/2015   Procedure: ESOPHAGOGASTRODUODENOSCOPY (EGD) WITH PROPOFOL;  Surgeon: Rachael Fee, MD;  Location: [facility];  Service: Endoscopy;  Laterality: N/A;   ESOPHAGOGASTRODUODENOSCOPY (EGD) WITH PROPOFOL N/A 11/28/2020   Procedure: ESOPHAGOGASTRODUODENOSCOPY (EGD) WITH PROPOFOL;  Surgeon: Shellia Cleverly, DO;  Location: [facility];  Service: Gastroenterology;  Laterality: N/A;   GIVENS CAPSULE STUDY N/A 11/28/2020   Procedure: GIVENS CAPSULE STUDY;  Surgeon: Shellia Cleverly, DO;  Location: [facility];  Service: Gastroenterology;  Laterality: N/A;   POLYPECTOMY  10/03/2019   Procedure: POLYPECTOMY;  Surgeon: Meryl Dare, MD;  Location: [facility];  Service: Endoscopy;;   POLYPECTOMY  11/22/2020   Procedure: POLYPECTOMY;  Surgeon: Meryl Dare, MD;  Location: [facility];  Service: Endoscopy;;    Prior to Admission medications   Medication Sig Start Date End Date Taking? Authorizing Provider  acetaminophen (TYLENOL) 325 MG tablet Take 2 tablets (650 mg total) by mouth every 6 (six) hours as needed for mild pain (or  Fever >/= 101). Patient taking differently: Take 650 mg by mouth as needed for mild pain (or Fever >/= 101). 09/19/19  Yes Berton Mount I, MD  atorvastatin (LIPITOR) 10 MG tablet Take 10 mg by mouth daily. 03/06/19  Yes [provider]  BD PEN NEEDLE NANO 2ND GEN 32G X 4 MM MISC Inject 1 Needle as directed daily. 07/15/23  Yes [provider]  carvedilol (COREG) 25 MG tablet Take 25 mg by mouth 2 (two) times daily. 10/30/20  Yes [provider]  dicyclomine (BENTYL) 20 MG tablet Take 1 tablet (20 mg total) by mouth 2 (two) times daily for 7 days. 07/19/23 07/26/23 Yes  Tanda Rockers A, DO  empagliflozin (JARDIANCE) 10 MG TABS tablet Take 10 mg by mouth daily.   Yes [provider]  insulin aspart protamine - aspart (NOVOLOG MIX 70/30 FLEXPEN) (70-30) 100 UNIT/ML FlexPen Inject 10-30 Units into the skin See admin instructions. 10 units in the morning, and 30 at bedtime   Yes [provider]  metformin (FORTAMET) 500 MG (OSM) 24 hr tablet Take 500 mg by mouth 2 (two) times daily with a meal.   Yes [provider]  methocarbamol (ROBAXIN) 500 MG tablet Take 500 mg by mouth as needed for muscle spasms.   Yes [provider]  Multiple Vitamins-Minerals (MULTIVITAMIN WITH MINERALS) tablet Take 1 tablet by mouth daily.   Yes [provider]  olmesartan (BENICAR) 40 MG tablet Take 40 mg by mouth daily. 10/11/19  Yes [provider]  pantoprazole (PROTONIX) 20 MG tablet Take 1 tablet (20 mg total) by mouth daily for 14 days. 07/19/23 08/02/23 Yes Tanda Rockers A, DO  Semaglutide,0.25 or 0.5MG /DOS, (OZEMPIC, 0.25 OR 0.5 MG/DOSE,) 2 MG/1.5ML SOPN Inject 2 mg into the skin once a week.   Yes [provider]    Current Facility-Administered Medications  Medication Dose Route Frequency Provider Last Rate Last Admin   acetaminophen (TYLENOL) tablet 650 mg  650 mg Oral Q6H PRN Arrien, York Ram, MD       Or   acetaminophen (TYLENOL) suppository 650 mg  650 mg Rectal Q6H PRN Arrien, York Ram, MD       atorvastatin (LIPITOR) tablet 10 mg  10 mg Oral Daily Arrien, York Ram, MD   10 mg at 07/19/23 1635   carvedilol (COREG) tablet 25 mg  25 mg Oral BID Arrien, York Ram, MD       feeding supplement (BOOST / RESOURCE BREEZE) liquid 1 Container  1 Container Oral TID BM Arrien, York Ram, MD   1 Container at 07/19/23 2025   insulin aspart (novoLOG) injection 0-9 Units  0-9 Units Subcutaneous TID WC Arrien, York Ram, MD       irbesartan (AVAPRO) tablet 37.5 mg  37.5 mg Oral Daily Arrien,  York Ram, MD       lactated ringers infusion   Intravenous Continuous Arrien, York Ram, MD 75 mL/hr at 07/20/23 2952 Restarted at 07/20/23 0702   multivitamin with minerals tablet 1 tablet  1 tablet Oral Daily Arrien, York Ram, MD       ondansetron [facility]) tablet 4 mg  4 mg Oral Q6H PRN Arrien, York Ram, MD       Or   ondansetron [facility]) injection 4 mg  4 mg Intravenous Q6H PRN Arrien, York Ram, MD   4 mg at 07/19/23 2017   pantoprazole (PROTONIX) EC tablet 40 mg  40 mg Oral Daily Arrien, York Ram, MD   [phone]  mg at 07/19/23 1635    Allergies as of 07/19/2023 - Review Complete 07/19/2023  Allergen Reaction Noted   Norvasc [amlodipine besylate] Swelling 02/22/2012   Nsaids Other (See Comments) 08/07/2015   Sulfa antibiotics Other (See Comments) 02/22/2012    Family History  Problem Relation Age of Onset   Hypertension Mother    Cancer Mother        ?uterine or ovarian cancer   Hypertension Father    Diabetes Father    Hypertension Sister    Diabetes Sister    Hypertension Brother    Diabetes Brother    Hypertension Maternal Grandfather    Diabetes Paternal Grandmother    Breast cancer Maternal Grandmother    Colon cancer Neg Hx     Social History   Socioeconomic History   Marital status: Single    Spouse name: N/A   Number of children: 0   Years of education: Not on file   Highest education level: Not on file  Occupational History   Occupation: NURSE    Comment: [employer]  Tobacco Use   Smoking status: Never   Smokeless tobacco: Never  Vaping Use   Vaping status: Never Used  Substance and Sexual Activity   Alcohol use: No   Drug use: No   Sexual activity: Not Currently    Birth control/protection: Post-menopausal  Other Topics Concern   Not on file  Social History Narrative   Lives alone. Parents live in [place].   Social Determinants of Health   Financial Resource Strain: Not on file  Food Insecurity: No Food  Insecurity (07/19/2023)   Hunger Vital Sign    Worried About Running Out of Food in the Last Year: Never true    Ran Out of Food in the Last Year: Never true  Transportation Needs: No Transportation Needs (07/19/2023)   PRAPARE - Administrator, Civil Service (Medical): No    Lack of Transportation (Non-Medical): No  Physical Activity: Not on file  Stress: Not on file  Social Connections: Not on file  Intimate Partner Violence: Not At Risk (07/19/2023)   Humiliation, Afraid, Rape, and Kick questionnaire    Fear of Current or Ex-Partner: No    Emotionally Abused: No    Physically Abused: No    Sexually Abused: No    Review of Systems: Gen: Denies fever, sweats or chills. No weight loss.  CV: Denies chest pain, palpitations or edema. Resp: Denies cough, shortness of breath of hemoptysis.  GI: See HPI. GU : Denies urinary burning, blood in urine, increased urinary frequency or incontinence. MS: Denies joint pain, muscles aches or weakness. Derm: Denies rash, itchiness, skin lesions or unhealing ulcers. Psych: Denies depression, anxiety, memory loss or confusion. Heme: Denies easy bruising, bleeding. Neuro:  Denies headaches, dizziness or paresthesias. Endo:  Denies any problems with DM, thyroid or adrenal function.  Physical Exam: Vital signs in last 24 hours: Temp:  [98.1 F (36.7 C)-98.9 F (37.2 C)] 98.6 F (37 C) (09/03 0649) Pulse Rate:  [72-84] 76 (09/03 0649) Resp:  [16-20] 18 (09/03 0649) BP: (89-147)/(53-83) 122/62 (09/03 0649) SpO2:  [96 %-100 %] 100 % (09/03 0649) Weight:  [105.7 kg] 105.7 kg (09/02 1025) Last BM Date : 07/19/23 General:  Alert 62 year old female in no acute distress. Head:  Normocephalic and atraumatic. Eyes:  No scleral icterus. Conjunctiva pink. Ears:  Normal auditory acuity. Nose:  No deformity, discharge or lesions. Mouth:  Dentition intact. No ulcers or lesions.  Neck:  Supple.  No lymphadenopathy or thyromegaly.  Lungs: Breath  sounds clear throughout. No wheezes, rhonchi or crackles.  Heart: No murmurs. Abdomen:, Nondistended.  Mild LUQ tenderness without rebound or guarding.  Positive bowel sounds to all 4 quadrants.  No palpable mass.  No hepatosplenomegaly.  No bruit. Rectal: Deferred. Musculoskeletal:  Symmetrical without gross deformities.  Pulses:  Normal pulses noted. Extremities:  Without clubbing or edema. Neurologic:  Alert and  oriented x 4. No focal deficits.  Skin:  Intact without significant lesions or rashes. Psych:  Alert and cooperative. Normal mood and affect.  Intake/Output from previous day: 09/02 0701 - 09/03 0700 In: 2145.1 [P.O.:180; I.V.:1264.1; Blood:701] Out: -  Intake/Output this shift: No intake/output data recorded.  Lab Results: Recent Labs    07/19/23 1052 07/20/23 0107  WBC 7.4 9.7  HGB 9.2* 6.6*  HCT 30.0* 21.3*  PLT 408* 309   BMET Recent Labs    07/19/23 1052 07/20/23 0107  NA 135 140  K 3.2* 3.9  CL 101 105  CO2 26 25  GLUCOSE 91 107*  BUN 6* 9  CREATININE 0.76 0.76  CALCIUM 8.3* 8.2*   LFT Recent Labs    07/19/23 1052  PROT 6.2*  ALBUMIN 3.0*  AST 14*  ALT 13  ALKPHOS 48  BILITOT 0.8   PT/INR Recent Labs    07/19/23 1052  LABPROT 12.8  INR 0.9   Hepatitis Panel No results for input(s): "HEPBSAG", "HCVAB", "HEPAIGM", "HEPBIGM" in the last 72 hours.    Studies/Results: CT ANGIO GI BLEED  Result Date: 07/20/2023 CLINICAL DATA:  Ongoing bloody bowel movements. EXAM: CTA ABDOMEN AND PELVIS WITHOUT AND WITH CONTRAST TECHNIQUE: Multidetector CT imaging of the abdomen and pelvis was performed using the standard protocol during bolus administration of intravenous contrast. Multiplanar reconstructed images and MIPs were obtained and reviewed to evaluate the vascular anatomy. RADIATION DOSE REDUCTION: This exam was performed according to the departmental dose-optimization program which includes automated exposure control, adjustment of the mA  and/or kV according to patient size and/or use of iterative reconstruction technique. CONTRAST:  OMNIPAQUE IOHEXOL 350 MG/ML SOLN COMPARISON:  Multiple prior CTs back to 2016. The 2 most recent are CT with IV contrast yesterday at 12:02 p.m., CTA abdomen and pelvis GI bleed protocol 07/09/2023. FINDINGS: VASCULAR Aorta: Normal caliber aorta without aneurysm, dissection, vasculitis or significant stenosis. Minimal scattered calcific plaques. Celiac: Normal.  No branch occlusions. SMA: Normal. No branch occlusions. Widely patent replaced right hepatic artery arises at the vessel inflection point. Renals: Both renal arteries are patent without evidence of aneurysm, dissection, vasculitis, fibromuscular dysplasia or significant stenosis. IMA: Normal.  No branch occlusions. Inflow: Patent without evidence of aneurysm, dissection, vasculitis or significant stenosis. Proximal Outflow: Bilateral common femoral and visualized portions of the superficial and profunda femoral arteries are patent without evidence of aneurysm, dissection, vasculitis or significant stenosis. Veins: Patent.  No portal vein dilatation. Review of the MIP images confirms the above findings. NON-VASCULAR Lower chest: Lung bases are clear of infiltrates. The heart is slightly enlarged. On the noncontrasted exam the intraventricular blood is lower in density than the myocardium. Findings consistent with anemia. Hepatobiliary: The liver is mildly steatotic. There is mild irregularity in the contour of the left lobe which may be seen with early cirrhosis. Gallbladder is absent without biliary dilatation. Pancreas: No abnormality. Spleen: No abnormality.  No splenomegaly. Adrenals/Urinary Tract: Status post right adrenalectomy. Normal left adrenal. Bilateral Bosniak 1 and 2 renal cysts are redemonstrated. No follow-up imaging is recommended.  There is no mass enhancement, no urinary stone or obstruction. There is contrast in the bladder. There is no  bladder thickening. Stomach/Bowel: There is no visible arterial or venous contrast extravasation into the GI tract. The unopacified stomach and small bowel are unremarkable. The appendix is normal. There is diffuse colonic diverticulosis without evidence of an acute diverticulitis. Lymphatic: No lymphadenopathy is seen. Reproductive: Enlarged lobular fibroid uterus with multiple calcified and partially calcified fibroids. No adnexal mass is seen. Other: Small umbilical and inguinal fat hernias. No incarcerated hernias. No free fluid, free hemorrhage or free air. Musculoskeletal: L4-5 with advanced facet hypertrophy, minimal grade 1 anterolisthesis and moderate acquired spinal stenosis. Chronic sacroiliitis. Additional degenerative change at L5-S1. Degenerative discs lower thoracic spine. No acute or significant osseous findings. IMPRESSION: 1. No visible arterial or venous contrast extravasation into the GI tract. 2. Minimal aortic atherosclerosis. No visceral artery stenoses or branch occlusions. 3. Diffuse colonic diverticulosis without evidence of acute diverticulitis. 4. Mild hepatic steatosis with mild irregularity in the left lobe which may be seen with early cirrhosis. No splenomegaly or ascites. 5. Fibroid uterus. 6. Umbilical and inguinal fat hernias. 7. Likely anemia. 8. Additional findings described above. Aortic Atherosclerosis (ICD10-I70.0). Electronically Signed   By: Almira Bar M.D.   On: 07/20/2023 01:22   CT ABDOMEN PELVIS W CONTRAST  Result Date: 07/19/2023 CLINICAL DATA:  Left lower quadrant abdominal pain. EXAM: CT ABDOMEN AND PELVIS WITH CONTRAST TECHNIQUE: Multidetector CT imaging of the abdomen and pelvis was performed using the standard protocol following bolus administration of intravenous contrast. RADIATION DOSE REDUCTION: This exam was performed according to the departmental dose-optimization program which includes automated exposure control, adjustment of the mA and/or kV  according to patient size and/or use of iterative reconstruction technique. CONTRAST:  75mL OMNIPAQUE IOHEXOL 350 MG/ML SOLN COMPARISON:  Abdominal CT 07/09/2023 and 07/07/2023. FINDINGS: Lower chest: The visualized lower chest appears unchanged, without acute or significant findings. Hepatobiliary: The liver is normal in density without suspicious focal abnormality. No evidence of biliary dilatation status post cholecystectomy. Pancreas: Unremarkable. No pancreatic ductal dilatation or surrounding inflammatory changes. Spleen: Normal in size without focal abnormality. 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 The left adrenal gland appears normal. No evidence of urinary tract calculus, suspicious renal lesion or hydronephrosis. Simple and mildly complex renal cysts are noted bilaterally which are similar to the previous study. In the upper pole of the left kidney, there is a 1.6 cm lesion with a density of 61 HU, and in the interpolar region of the left kidney, there is a 1.2 cm lesion with a density of 42 HU. These did not appear to be enhancing on the most recent CT which was performed without and with contrast and are consistent with Bosniak 2 cysts. No specific follow-up imaging recommended. The bladder appears unremarkable for its degree of distention. Stomach/Bowel: No enteric contrast administered. The stomach appears unremarkable for its degree of distension. No evidence of bowel wall thickening, distention or surrounding inflammatory change. The appendix appears normal. There are diverticular changes throughout the colon. Vascular/Lymphatic: There are no enlarged abdominal or pelvic lymph nodes. Minimal atherosclerosis. No acute vascular findings or aneurysm. Reproductive: Numerous calcified and exophytic uterine fibroids are grossly stable. No suspicious adnexal findings. Other: Stable small periumbilical hernia containing only fat. No ascites or  pneumoperitoneum. Musculoskeletal: No acute or significant osseous findings. Multilevel lumbar spondylosis. Unless specific follow-up recommendations are mentioned in the findings or impression sections, no imaging follow-up of any mentioned  incidental findings is recommended. IMPRESSION: 1. No acute findings or explanation for the patient's symptoms. 2. Diffuse colonic diverticulosis without evidence of acute inflammation. 3. 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 4. Stable uterine fibroids. Electronically Signed   By: Carey Bullocks M.D.   On: 07/19/2023 12:32    IMPRESSION/PLAN:  62 year old female with a history of pandiverticulosis and diverticular bleed re-admitted to the hospital with hematochezia and anemia, suspect diverticular bleed. Hg 9.2 up from Hg 8.2 at time of prior hospital discharge 07/15/2023. Hg level dropped to 6.6 this morning -> transfused one unit of PRBCs -> post transfusion H/H pending.  CTA without evidence of active GI bleeding.  Last episode of hematochezia was around 8 PM last night without recurrence thus far this morning. -Clear liquid diet -NPO after midnight -IV fluids per the hospitalist -Colonoscopy tomorrow with Dr. Adela Lank, benefits and risks discussed including risk with sedation, risk of bleeding, perforation and infection  -Check H&H posttransfusion every 6 hours x 24 hours -Transfuse for hemoglobin less than 7 or as needed if symptomatic  -GERD, LUQ pain -Continue Pantoprazole 40 mg p.o. daily -EGD deferred for now  History of colon polyps  Diabetes mellitus type 2  Cushing's disease, past right adrenalectomy.   Malachi Carl Kennedy-Smith  07/20/2023, 8:50AM

## 2023-07-20 NOTE — Progress Notes (Signed)
Transition of Care [facility]) - Inpatient Brief Assessment   Patient Details  Name: Carol Mueller MRN: [id number] Date of Birth: Nov 27, 1960  Transition of Care [facility]) CM/SW Contact:    Janae Bridgeman, RN Phone Number: 07/20/2023, 4:39 PM   Clinical Narrative: Patient admitted to the hospital with GI bleed.  No TOC needs at this time.   Transition of Care Asessment: Insurance and Status: (P) Insurance coverage has been reviewed Patient has primary care physician: (P) Yes Home environment has been reviewed: (P) home alone Prior level of function:: (P) Independent Prior/Current Home Services: (P) No current home services Social Determinants of Health Reivew: (P) SDOH reviewed no interventions necessary Readmission risk has been reviewed: (P) Yes Transition of care needs: (P) no transition of care needs at this time

## 2023-07-21 ENCOUNTER — Encounter (HOSPITAL_COMMUNITY)
Admission: EM | Disposition: A | Discharge status: Home / Self Care | Payer: Self-pay | Source: Home / Self Care | Attending: Internal Medicine

## 2023-07-21 ENCOUNTER — Inpatient Hospital Stay (HOSPITAL_COMMUNITY): Payer: No Typology Code available for payment source | Admitting: Certified Registered"

## 2023-07-21 ENCOUNTER — Inpatient Hospital Stay (HOSPITAL_COMMUNITY): Payer: No Typology Code available for payment source

## 2023-07-21 ENCOUNTER — Encounter (HOSPITAL_COMMUNITY): Payer: Self-pay | Admitting: Internal Medicine

## 2023-07-21 DIAGNOSIS — K648 Other hemorrhoids: Secondary | ICD-10-CM

## 2023-07-21 DIAGNOSIS — I10 Essential (primary) hypertension: Secondary | ICD-10-CM

## 2023-07-21 DIAGNOSIS — K921 Melena: Secondary | ICD-10-CM

## 2023-07-21 DIAGNOSIS — K5730 Diverticulosis of large intestine without perforation or abscess without bleeding: Secondary | ICD-10-CM

## 2023-07-21 DIAGNOSIS — E785 Hyperlipidemia, unspecified: Secondary | ICD-10-CM

## 2023-07-21 HISTORY — PX: COLONOSCOPY WITH PROPOFOL: SHX5780

## 2023-07-21 LAB — BASIC METABOLIC PANEL
Anion gap: 8 (ref 5–15)
BUN: <5 mg/dL — ABNORMAL LOW (ref 8–23)
CO2: 25 mmol/L (ref 22–32)
Calcium: 8.4 mg/dL — ABNORMAL LOW (ref 8.9–10.3)
Chloride: 109 mmol/L (ref 98–111)
Creatinine, Ser: 0.79 mg/dL (ref 0.44–1.00)
GFR, Estimated: >60 mL/min (ref >60)
Glucose, Bld: 116 mg/dL — ABNORMAL HIGH (ref 70–99)
Potassium: 3.6 mmol/L (ref 3.5–5.1)
Sodium: 142 mmol/L (ref 135–145)

## 2023-07-21 LAB — GLUCOSE, CAPILLARY
Glucose-Capillary: 109 mg/dL — ABNORMAL HIGH (ref 70–99)
Glucose-Capillary: 98 mg/dL (ref 70–99)
Glucose-Capillary: 151 mg/dL — ABNORMAL HIGH (ref 70–99)
Glucose-Capillary: 128 mg/dL — ABNORMAL HIGH (ref 70–99)
Glucose-Capillary: 135 mg/dL — ABNORMAL HIGH (ref 70–99)

## 2023-07-21 LAB — CBC
HCT: 24.2 % — ABNORMAL LOW (ref 36.0–46.0)
Hemoglobin: 7.8 g/dL — ABNORMAL LOW (ref 12.0–15.0)
MCH: 25.7 pg — ABNORMAL LOW (ref 26.0–34.0)
MCHC: 32.2 g/dL (ref 30.0–36.0)
MCV: 79.9 fL — ABNORMAL LOW (ref 80.0–100.0)
Platelets: 322 10*3/uL (ref 150–400)
RBC: 3.03 MIL/uL — ABNORMAL LOW (ref 3.87–5.11)
RDW: 21.3 % — ABNORMAL HIGH (ref 11.5–15.5)
WBC: 8.9 10*3/uL (ref 4.0–10.5)
nRBC: 0 % (ref 0.0–0.2)

## 2023-07-21 LAB — MAGNESIUM: Magnesium: 1.9 mg/dL (ref 1.7–2.4)

## 2023-07-21 SURGERY — COLONOSCOPY WITH PROPOFOL
Anesthesia: Monitor Anesthesia Care

## 2023-07-21 MED ORDER — LACTATED RINGERS IV SOLN: INTRAVENOUS | Status: DC

## 2023-07-21 MED ORDER — SODIUM CHLORIDE 0.9 % IV SOLN: INTRAVENOUS | Status: DC

## 2023-07-21 MED ORDER — ALUM & MAG HYDROXIDE-SIMETH 200-200-20 MG/5ML PO SUSP
30.0000 mL | ORAL | Status: DC | PRN
  Filled 2023-07-21: qty 30

## 2023-07-21 MED ORDER — PROPOFOL 500 MG/50ML IV EMUL
INTRAVENOUS | Status: DC | PRN
  Administered 2023-07-21: 25 mg via INTRAVENOUS
  Administered 2023-07-21: 125 ug/kg/min via INTRAVENOUS
  Administered 2023-07-21: 25 mg via INTRAVENOUS

## 2023-07-21 MED ORDER — ALUM & MAG HYDROXIDE-SIMETH 200-200-20 MG/5 ML NICU TOPICAL: 1.0000 | TOPICAL | Status: DC | PRN

## 2023-07-21 MED ORDER — LACTATED RINGERS IV SOLN
INTRAVENOUS | Status: DC | PRN
Start: 2023-07-21 — End: 2023-07-21

## 2023-07-21 SURGICAL SUPPLY — 22 items

## 2023-07-21 NOTE — Anesthesia Preprocedure Evaluation (Signed)
Anesthesia Evaluation  Patient identified by MRN, date of birth, ID band Patient awake    Reviewed: Allergy & Precautions, NPO status , Patient's Chart, lab work & pertinent test results  History of Anesthesia Complications Negative for: history of anesthetic complications  Airway Mallampati: III  TM Distance: >3 FB Neck ROM: Full    Dental  (+) Dental Advisory Given   Pulmonary neg shortness of breath, neg sleep apnea, neg COPD, neg recent URI   breath sounds clear to auscultation       Cardiovascular hypertension, Pt. on medications  Rhythm:Regular     Neuro/Psych  Neuromuscular disease    GI/Hepatic ,GERD  ,,? GI bleed   Endo/Other  diabetes  Morbid obesity  Renal/GU      Musculoskeletal   Abdominal   Peds  Hematology  (+) Blood dyscrasia, anemia Lab Results      Component                Value               Date                      WBC                      8.9                 07/21/2023                HGB                      7.8 (L)             07/21/2023                HCT                      24.2 (L)            07/21/2023                MCV                      79.9 (L)            07/21/2023                PLT                      322                 07/21/2023              Anesthesia Other Findings   Reproductive/Obstetrics                             Anesthesia Physical Anesthesia Plan  ASA: 3  Anesthesia Plan: MAC   Post-op Pain Management: Minimal or no pain anticipated   Induction: Intravenous  PONV Risk Score and Plan: 2 and Propofol infusion and Treatment may vary due to age or medical condition  Airway Management Planned: Nasal Cannula, Simple Face Mask and Natural Airway  Additional Equipment: None  Intra-op Plan:   Post-operative Plan:   Informed Consent: I have reviewed the patients History and Physical, chart, labs and discussed the procedure including  the risks, benefits and alternatives for the proposed anesthesia with the  patient or authorized representative who has indicated his/her understanding and acceptance.     Dental advisory given  Plan Discussed with: CRNA  Anesthesia Plan Comments:        Anesthesia Quick Evaluation

## 2023-07-21 NOTE — Interval H&P Note (Signed)
History and Physical Interval Note: Patient feeling okay today. Hgb drifted from 8.1 to 7.8. She completed her bowel prep - states throughout most of it was passing [place] stool but has had some recurrence of red blood this AM. She denies any pain. I have discussed colonoscopy with her, risks / benefits of the exam and anesthesia and she wishes to proceed. Further recommendations pending the results.   07/21/2023 7:56 AM  Carol Mueller  has presented today for surgery, with the diagnosis of GI bleed.  The various methods of treatment have been discussed with the patient and family. After consideration of risks, benefits and other options for treatment, the patient has consented to  Procedure(s): COLONOSCOPY WITH PROPOFOL (N/A) as a surgical intervention.  The patient's history has been reviewed, patient examined, no change in status, stable for surgery.  I have reviewed the patient's chart and labs.  Questions were answered to the patient's satisfaction.     Viviann Spare P Brayen Bunn

## 2023-07-21 NOTE — Plan of Care (Signed)
Problem: Education: Goal: Individualized Educational Video(s) Outcome: Progressing Pt has a history of DM2.  While she is here in the hospital MD ordered for her BSBG to be monitored ACHS.  She is receiving insulin coverage depending on what her BSBG levels are per MD's orders.    Problem: Fluid Volume: Goal: Ability to maintain a balanced intake and output will improve Outcome: Progressing Pt is on a soft diet per MD's orders.  She is also on strict I/O's with daily weights per MD's orders.   Problem: Nutritional: Goal: Maintenance of adequate nutrition will improve Outcome: Progressing Pt has a history of DM2.  While she is here in the hospital MD ordered for her BSBG to be monitored ACHS.  She is receiving insulin coverage depending on what her BSBG levels are per MD's orders.    Problem: Skin Integrity: Goal: Risk for impaired skin integrity will decrease Outcome: Progressing Skin integrity monitored and assessed q-shift.  Instructed pt to turn/ reposition himself q2 hours to prevent further skin impairment.  Tubes and drains assessed for device related pressure sores.  Pt is continent of both bowel and bladder.  Dressing changes performed per MD's orders.

## 2023-07-21 NOTE — Op Note (Signed)
[facility] Patient Name: Carol Mueller Procedure Date : 07/21/2023 MRN: [id number] Attending MD: Willaim Rayas. Adela Lank , MD, [id number] Date of Birth: 1961-03-09 CSN: [id number] Age: 62 Admit Type: Inpatient Procedure:                Colonoscopy Indications:              Hematochezia - recurrent lower GI bleeding over the                            past 2 weeks. Multiple CTAs have not been able to                            localize any active bleeding. History of obscur GI                            bleeding in the past with extensive evaluation,                            thought to be diverticular in nature. Colonoscopy                            to further evaluate Providers:                Viviann Spare P. Adela Lank, MD, Jacquelyn "Jaci" Clelia Croft,                            RN, Doristine Mango, RN, Rozetta Nunnery, Technician Referring MD:              Medicines:                Monitored Anesthesia Care Complications:            No immediate complications. Estimated blood loss:                            None. Estimated Blood Loss:     Estimated blood loss: none. Procedure:                Pre-Anesthesia Assessment:                           - Prior to the procedure, a History and Physical                            was performed, and patient medications and                            allergies were reviewed. The patient's tolerance of                            previous anesthesia was also reviewed. The risks                            and benefits of the procedure and the sedation  options and risks were discussed with the patient.                            All questions were answered, and informed consent                            was obtained. Prior Anticoagulants: The patient has                            taken no anticoagulant or antiplatelet agents. ASA                            Grade Assessment: III - A patient with severe                             systemic disease. After reviewing the risks and                            benefits, the patient was deemed in satisfactory                            condition to undergo the procedure.                           After obtaining informed consent, the colonoscope                            was passed under direct vision. Throughout the                            procedure, the patient's blood pressure, pulse, and                            oxygen saturations were monitored continuously. The                            CF-HQ190L ([id number]) Olympus coloscope was                            introduced through the anus and advanced to the the                            cecum, identified by appendiceal orifice and                            ileocecal valve. The colonoscopy was performed                            without difficulty. The patient tolerated the                            procedure well. The quality of the bowel  preparation was good. The ileocecal valve,                            appendiceal orifice, and rectum were photographed. Scope In: 8:45:49 AM Scope Out: 9:08:43 AM Scope Withdrawal Time: 0 hours 14 minutes 21 seconds  Total Procedure Duration: 0 hours 22 minutes 54 seconds  Findings:      The perianal and digital rectal examinations were normal.      Many diverticula were found in the entire colon. These were lavaged       extensively. Highest burden in the sigmoid colon. There was no heme or       any stigmata of recent bleeding appreciated.      Internal hemorrhoids were found during retroflexion. The hemorrhoids       were moderate in size.      The exam was otherwise without abnormality. No blood noted anywhere -       patient stopped bleeding with the prep. Over note, there was looping in       the colon, abdominal pressure used to achieve cecal intubation. Ileum       was not able to be intubated due to looping. Impression:                - Diverticulosis in the entire examined colon.                           - Internal hemorrhoids.                           - The examination was otherwise normal.                           - Following extensive lavgage - there is no                            appreciable focal lesion / stigmata for bleeding                            noted.                           I suspect the patient has likely had stuttering                            diverticular bleeding. No focal lesion with                            stigmata for bleeding to intervene on                            endoscopically. She has stopped bleeding with the                            prep. Recommendation:           - Return patient to hospital ward for ongoing care.                           -  Advance diet as tolerated.                           - Continue present medications.                           - Trend Hgb today and monitor for recurrent bleeding                           - If patient has recurrent bleeding, consider                            tagged RBC scan                           - We will follow, call with questions in the                            interim. Procedure Code(s):        --- Professional ---                           [id number], Colonoscopy, flexible; diagnostic, including                            collection of specimen(s) by brushing or washing,                            when performed (separate procedure) Diagnosis Code(s):        --- Professional ---                           K64.8, Other hemorrhoids                           K92.1, Melena (includes Hematochezia)                           K57.30, Diverticulosis of large intestine without                            perforation or abscess without bleeding CPT copyright 2022 American Medical Association. All rights reserved. The codes documented in this report are preliminary and upon coder review may  be revised to meet current compliance  requirements. Viviann Spare P. Reinhard Schack, MD 07/21/2023 9:20:20 AM This report has been signed electronically. Number of Addenda: 0

## 2023-07-21 NOTE — Progress Notes (Signed)
OT Cancellation Note  Patient Details Name: Carol Mueller MRN: [id number] DOB: August 17, 1961   Cancelled Treatment:    Reason Eval/Treat Not Completed: Patient at procedure or test/ unavailable. Will return as schedule allows.   Tyler Deis, OTR/L [facility] Acute Rehabilitation Office: [phone]   Myrla Halsted 07/21/2023, 8:32 AM

## 2023-07-21 NOTE — Progress Notes (Signed)
PROGRESS NOTE    Carol Mueller  QIO:[id number] DOB: [birth year] DOA: 07/19/2023 PCP: [name], DO   Brief Narrative:  62 year old with history of colonic diverticulosis, HTN, DM2, obesity admitted for bright red blood per rectum.  Had similar presentation 8/21 - 8/29 for colonic diverticular bleed requiring 3 units PRBC.  CT angio was negative for active bleeding and it subsided spontaneously.  GI had recommended Pepcid p.o. twice daily for 6-8 weeks and outpatient follow-up.  Patient readmitted and CT scan did not show any active bleeding, underwent colonoscopy on 9/4 which again did not show any active bleed.   Assessment & Plan:  Active Problems:   Acute lower GI bleeding   Hypokalemia   Essential hypertension   Type 2 diabetes mellitus with hyperlipidemia (HCC)   Class 3 obesity (HCC)   GI bleed   Anemia   Diverticulosis   Hematochezia Recurrent diverticular bleed Acute blood loss anemia - Concerns of recurrent diverticular bleed.  Admitted for similar issue about of a week ago.  Seen by GI, symptoms resolved spontaneously.  Initial CT abdomen pelvis with contrast followed by CT angio were both negative for acute and active bleed.  Recommendations were to keep her on Pepcid 20 mg twice daily over next 6-8 weeks and avoid NSAIDs/aspirin.  Colonoscopy today did not show any active bleed but will continue to monitor.  PRBC transfusion as needed.  Diet as tolerated.   Hypokalemia/hypomagnesemia -Replete as needed  Essential hypertension -Coreg, Avapro.  IV as needed  Diabetes mellitus type 2 -Sliding scale and Accu-Cheks -Low-dose statin  History of Cushing's disease status post right adrenalectomy -Supportive care  DVT prophylaxis: SCDs Start: 07/19/23 1614 Code Status: Full Family Communication:   Monitor for any further bleeding per GI today.  May be discharged tomorrow    Subjective: Seen after colonoscopy, no complaints Examination:  Constitutional: Not in  acute distress Respiratory: Clear to auscultation bilaterally Cardiovascular: Normal sinus rhythm, no rubs Abdomen: Nontender nondistended good bowel sounds Musculoskeletal: No edema noted Skin: No rashes seen Neurologic: CN 2-12 grossly intact.  And nonfocal Psychiatric: Normal judgment and insight. Alert and oriented x 3. Normal mood.   Diet Orders (From admission, onward)     Start     Ordered   07/21/23 0921  DIET SOFT Room service appropriate? Yes; Fluid consistency: Thin  Diet effective now       Question Answer Comment  Room service appropriate? Yes   Fluid consistency: Thin      07/21/23 0920            Objective: Vitals:   07/21/23 0914 07/21/23 0919 07/21/23 0924 07/21/23 0934  BP: 133/63 131/66 (!) 153/75 (!) 151/83  Pulse: 84 86 83 78  Resp: 18 18 20 19   Temp: (!) 97 F (36.1 C)     TempSrc: Temporal     SpO2: 97% 98% 99% 96%  Weight:      Height:        Intake/Output Summary (Last 24 hours) at 07/21/2023 1153 Last data filed at 07/21/2023 0910 Gross per 24 hour  Intake 1103.61 ml  Output --  Net 1103.61 ml   Filed Weights   07/19/23 1025 07/21/23 0757  Weight: 105.7 kg 105.7 kg    Scheduled Meds:  atorvastatin  10 mg Oral Daily   carvedilol  25 mg Oral BID   feeding supplement  1 Container Oral TID BM   insulin aspart  0-9 Units Subcutaneous TID WC   irbesartan  37.5 mg Oral Daily   multivitamin with minerals  1 tablet Oral Daily   pantoprazole  40 mg Oral QAC breakfast   Continuous Infusions:    Nutritional status     Body mass index is 41.28 kg/m.  Data Reviewed:   CBC: Recent Labs  Lab 07/15/23 0437 07/19/23 1052 07/20/23 0107 07/20/23 0839 07/21/23 0500  WBC 9.9 7.4 9.7  --  8.9  NEUTROABS  --  4.7  --   --   --   HGB 8.2* 9.2* 6.6* 8.1* 7.8*  HCT 25.8* 30.0* 21.3* 25.7* 24.2*  MCV 76.1* 78.5* 76.3*  --  79.9*  PLT 307 408* 309  --  322   Basic Metabolic Panel: Recent Labs  Lab 07/15/23 0437 07/19/23 1052  07/20/23 0107 07/21/23 0500  NA  --  135 140 142  K 3.3* 3.2* 3.9 3.6  CL  --  101 105 109  CO2  --  26 25 25   GLUCOSE  --  91 107* 116*  BUN  --  6* 9 <5*  CREATININE  --  0.76 0.76 0.79  CALCIUM 8.6* 8.3* 8.2* 8.4*  MG 1.7  --  1.5* 1.9   GFR: Estimated Creatinine Clearance: 83.7 mL/min (by C-G formula based on SCr of 0.79 mg/dL). Liver Function Tests: Recent Labs  Lab 07/19/23 1052  AST 14*  ALT 13  ALKPHOS 48  BILITOT 0.8  PROT 6.2*  ALBUMIN 3.0*   Recent Labs  Lab 07/19/23 1052  LIPASE 26   No results for input(s): "AMMONIA" in the last 168 hours. Coagulation Profile: Recent Labs  Lab 07/19/23 1052  INR 0.9   Cardiac Enzymes: No results for input(s): "CKTOTAL", "CKMB", "CKMBINDEX", "TROPONINI" in the last 168 hours. BNP (last 3 results) No results for input(s): "PROBNP" in the last 8760 hours. HbA1C: No results for input(s): "HGBA1C" in the last 72 hours. CBG: Recent Labs  Lab 07/20/23 1148 07/20/23 1723 07/20/23 1922 07/21/23 0539 07/21/23 0809  GLUCAP 144* 133* 111* 109* 98   Lipid Profile: No results for input(s): "CHOL", "HDL", "LDLCALC", "TRIG", "CHOLHDL", "LDLDIRECT" in the last 72 hours. Thyroid Function Tests: No results for input(s): "TSH", "T4TOTAL", "FREET4", "T3FREE", "THYROIDAB" in the last 72 hours. Anemia Panel: No results for input(s): "VITAMINB12", "FOLATE", "FERRITIN", "TIBC", "IRON", "RETICCTPCT" in the last 72 hours. Sepsis Labs: No results for input(s): "PROCALCITON", "LATICACIDVEN" in the last 168 hours.  No results found for this or any previous visit (from the past 240 hour(s)).       Radiology Studies: CT ANGIO GI BLEED  Result Date: 07/20/2023 CLINICAL DATA:  Ongoing bloody bowel movements. EXAM: CTA ABDOMEN AND PELVIS WITHOUT AND WITH CONTRAST TECHNIQUE: Multidetector CT imaging of the abdomen and pelvis was performed using the standard protocol during bolus administration of intravenous contrast. Multiplanar  reconstructed images and MIPs were obtained and reviewed to evaluate the vascular anatomy. RADIATION DOSE REDUCTION: This exam was performed according to the departmental dose-optimization program which includes automated exposure control, adjustment of the mA and/or kV according to patient size and/or use of iterative reconstruction technique. CONTRAST:  OMNIPAQUE IOHEXOL 350 MG/ML SOLN COMPARISON:  Multiple prior CTs back to 2016. The 2 most recent are CT with IV contrast yesterday at 12:02 p.m., CTA abdomen and pelvis GI bleed protocol 07/09/2023. FINDINGS: VASCULAR Aorta: Normal caliber aorta without aneurysm, dissection, vasculitis or significant stenosis. Minimal scattered calcific plaques. Celiac: Normal.  No branch occlusions. SMA: Normal. No branch occlusions. Widely patent replaced right hepatic artery  arises at the vessel inflection point. Renals: Both renal arteries are patent without evidence of aneurysm, dissection, vasculitis, fibromuscular dysplasia or significant stenosis. IMA: Normal.  No branch occlusions. Inflow: Patent without evidence of aneurysm, dissection, vasculitis or significant stenosis. Proximal Outflow: Bilateral common femoral and visualized portions of the superficial and profunda femoral arteries are patent without evidence of aneurysm, dissection, vasculitis or significant stenosis. Veins: Patent.  No portal vein dilatation. Review of the MIP images confirms the above findings. NON-VASCULAR Lower chest: Lung bases are clear of infiltrates. The heart is slightly enlarged. On the noncontrasted exam the intraventricular blood is lower in density than the myocardium. Findings consistent with anemia. Hepatobiliary: The liver is mildly steatotic. There is mild irregularity in the contour of the left lobe which may be seen with early cirrhosis. Gallbladder is absent without biliary dilatation. Pancreas: No abnormality. Spleen: No abnormality.  No splenomegaly. Adrenals/Urinary  Tract: Status post right adrenalectomy. Normal left adrenal. Bilateral Bosniak 1 and 2 renal cysts are redemonstrated. No follow-up imaging is recommended. There is no mass enhancement, no urinary stone or obstruction. There is contrast in the bladder. There is no bladder thickening. Stomach/Bowel: There is no visible arterial or venous contrast extravasation into the GI tract. The unopacified stomach and small bowel are unremarkable. The appendix is normal. There is diffuse colonic diverticulosis without evidence of an acute diverticulitis. Lymphatic: No lymphadenopathy is seen. Reproductive: Enlarged lobular fibroid uterus with multiple calcified and partially calcified fibroids. No adnexal mass is seen. Other: Small umbilical and inguinal fat hernias. No incarcerated hernias. No free fluid, free hemorrhage or free air. Musculoskeletal: L4-5 with advanced facet hypertrophy, minimal grade 1 anterolisthesis and moderate acquired spinal stenosis. Chronic sacroiliitis. Additional degenerative change at L5-S1. Degenerative discs lower thoracic spine. No acute or significant osseous findings. IMPRESSION: 1. No visible arterial or venous contrast extravasation into the GI tract. 2. Minimal aortic atherosclerosis. No visceral artery stenoses or branch occlusions. 3. Diffuse colonic diverticulosis without evidence of acute diverticulitis. 4. Mild hepatic steatosis with mild irregularity in the left lobe which may be seen with early cirrhosis. No splenomegaly or ascites. 5. Fibroid uterus. 6. Umbilical and inguinal fat hernias. 7. Likely anemia. 8. Additional findings described above. Aortic Atherosclerosis (ICD10-I70.0). Electronically Signed   By: Almira Bar M.D.   On: 07/20/2023 01:22   CT ABDOMEN PELVIS W CONTRAST  Result Date: 07/19/2023 CLINICAL DATA:  Left lower quadrant abdominal pain. EXAM: CT ABDOMEN AND PELVIS WITH CONTRAST TECHNIQUE: Multidetector CT imaging of the abdomen and pelvis was performed using  the standard protocol following bolus administration of intravenous contrast. RADIATION DOSE REDUCTION: This exam was performed according to the departmental dose-optimization program which includes automated exposure control, adjustment of the mA and/or kV according to patient size and/or use of iterative reconstruction technique. CONTRAST:  75mL OMNIPAQUE IOHEXOL 350 MG/ML SOLN COMPARISON:  Abdominal CT 07/09/2023 and 07/07/2023. FINDINGS: Lower chest: The visualized lower chest appears unchanged, without acute or significant findings. Hepatobiliary: The liver is normal in density without suspicious focal abnormality. No evidence of biliary dilatation status post cholecystectomy. Pancreas: Unremarkable. No pancreatic ductal dilatation or surrounding inflammatory changes. Spleen: Normal in size without focal abnormality. 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 The left adrenal gland appears normal. No evidence of urinary tract calculus, suspicious renal lesion or hydronephrosis. Simple and mildly complex renal cysts are noted bilaterally which are similar to the previous study. In the upper pole of the left kidney, there is a 1.6 cm lesion  with a density of 61 HU, and in the interpolar region of the left kidney, there is a 1.2 cm lesion with a density of 42 HU. These did not appear to be enhancing on the most recent CT which was performed without and with contrast and are consistent with Bosniak 2 cysts. No specific follow-up imaging recommended. The bladder appears unremarkable for its degree of distention. Stomach/Bowel: No enteric contrast administered. The stomach appears unremarkable for its degree of distension. No evidence of bowel wall thickening, distention or surrounding inflammatory change. The appendix appears normal. There are diverticular changes throughout the colon. Vascular/Lymphatic: There are no enlarged abdominal or pelvic lymph nodes. Minimal  atherosclerosis. No acute vascular findings or aneurysm. Reproductive: Numerous calcified and exophytic uterine fibroids are grossly stable. No suspicious adnexal findings. Other: Stable small periumbilical hernia containing only fat. No ascites or pneumoperitoneum. Musculoskeletal: No acute or significant osseous findings. Multilevel lumbar spondylosis. Unless specific follow-up recommendations are mentioned in the findings or impression sections, no imaging follow-up of any mentioned incidental findings is recommended. IMPRESSION: 1. No acute findings or explanation for the patient's symptoms. 2. Diffuse colonic diverticulosis without evidence of acute inflammation. 3. 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 4. Stable uterine fibroids. Electronically Signed   By: Carey Bullocks M.D.   On: 07/19/2023 12:32           LOS: 1 day   Time spent= 35 mins    Miguel Rota, MD [facility]  If 7PM-7AM, please contact night-coverage  07/21/2023, 11:53 AM

## 2023-07-21 NOTE — Transfer of Care (Signed)
Immediate Anesthesia Transfer of Care Note  Patient: Carol Mueller  Procedure(s) Performed: COLONOSCOPY WITH PROPOFOL  Patient Location: Endoscopy Unit  Anesthesia Type:MAC  Level of Consciousness: awake, drowsy, and patient cooperative  Airway & Oxygen Therapy: Patient Spontanous Breathing and Patient connected to nasal cannula oxygen  Post-op Assessment: Report given to RN and Post -op Vital signs reviewed and stable  Post vital signs: Reviewed and stable  Last Vitals:  Vitals Value Taken Time  BP    Temp    Pulse 87 07/21/23 0914  Resp 24 07/21/23 0914  SpO2 99 % 07/21/23 0914  Vitals shown include unfiled device data.  Last Pain:  Vitals:   07/21/23 0809  TempSrc:   PainSc: 2       Patients Stated Pain Goal: 5 (07/21/23 0809)  Complications: No notable events documented.

## 2023-07-21 NOTE — Evaluation (Signed)
Occupational Therapy Evaluation Patient Details Name: Carol Mueller MRN: [id number] DOB: 02-28-1961 Today's Date: 07/21/2023   History of Present Illness 62 y.o. female presents to [facility] hospital on 07/19/2023 with bright red blood per rectum. Pt with recent hospitalization 8/21-8/29 with colonic diverticular bleed. PMH includes cushing's syndrome, DM, HLD, HTN, polycystic ovary disease, sepsis.   Clinical Impression   PTA, pt lived alone and was independent in ADL, IADL, driving, and working. Upon eval, pt performing UB and LB ADL independently. Occasional mod I for increased time for more complex tasks such as retrieving items from floor due to short bouts of light headedness. Discussed compensatory techniques for LB ADL for abdominal comfort as well as retrieving items from floor to reduce experience of dizziness. Pt pleasant and conversational throughout. Recommending discharge home with no OT follow. OT to sign off. Thank you for this order, re-consult if change in status.       If plan is discharge home, recommend the following: Other (comment) (on pt request)    Functional Status Assessment  Patient has had a recent decline in their functional status and demonstrates the ability to make significant improvements in function in a reasonable and predictable amount of time.  Equipment Recommendations  None recommended by OT    Recommendations for Other Services       Precautions / Restrictions Precautions Precautions: None Restrictions Weight Bearing Restrictions: No      Mobility Bed Mobility Overal bed mobility: Independent                  Transfers Overall transfer level: Independent Equipment used: None                      Balance Overall balance assessment: Modified Independent         Standing balance support: No upper extremity supported, During functional activity                               ADL either performed or assessed with  clinical judgement   ADL Overall ADL's : Independent                                       General ADL Comments: intermittent increased time due to experience of mild dizziness with prolonged standing, retrieving items from floor etc     Vision Ability to See in Adequate Light: 0 Adequate Patient Visual Report: No change from baseline Vision Assessment?: No apparent visual deficits     Perception Perception: Within Functional Limits       Praxis Praxis: WFL       Pertinent Vitals/Pain Pain Assessment Pain Assessment: No/denies pain     Extremity/Trunk Assessment Upper Extremity Assessment Upper Extremity Assessment: Overall WFL for tasks assessed (5/5)   Lower Extremity Assessment Lower Extremity Assessment: Defer to PT evaluation   Cervical / Trunk Assessment Cervical / Trunk Assessment: Normal   Communication Communication Communication: No apparent difficulties   Cognition Arousal: Alert Behavior During Therapy: WFL for tasks assessed/performed Overall Cognitive Status: Within Functional Limits for tasks assessed                                       General Comments  VSS on RA  Exercises     Shoulder Instructions      Home Living Family/patient expects to be discharged to:: Private residence Living Arrangements: Alone Available Help at Discharge: Family Type of Home: House Home Access: Stairs to enter Secretary/administrator of Steps: 5 Entrance Stairs-Rails: Can reach both Home Layout: One level     Bathroom Shower/Tub: Chief Strategy Officer: Standard     Home Equipment: None          Prior Functioning/Environment Prior Level of Function : Independent/Modified Independent             Mobility Comments: ambulatory without DME ADLs Comments: 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        OT Problem List: Cardiopulmonary status limiting activity;Impaired balance  (sitting and/or standing);Decreased activity tolerance      OT Treatment/Interventions:      OT Goals(Current goals can be found in the care plan section) Acute Rehab OT Goals Patient Stated Goal: get better OT Goal Formulation: With patient  OT Frequency:      Co-evaluation              AM-PAC OT "6 Clicks" Daily Activity     Outcome Measure Help from another person eating meals?: None Help from another person taking care of personal grooming?: None Help from another person toileting, which includes using toliet, bedpan, or urinal?: None Help from another person bathing (including washing, rinsing, drying)?: None Help from another person to put on and taking off regular upper body clothing?: None Help from another person to put on and taking off regular lower body clothing?: None 6 Click Score: 24   End of Session Nurse Communication: Mobility status  Activity Tolerance: Patient tolerated treatment well Patient left: in chair;with call bell/phone within reach  OT Visit Diagnosis: Unsteadiness on feet (R26.81);Muscle weakness (generalized) (M62.81)                Time: 9562-1308 OT Time Calculation (min): 21 min Charges:  OT General Charges $OT Visit: 1 Visit OT Evaluation $OT Eval Low Complexity: 1 Low  Tyler Deis, OTR/L [facility] Acute Rehabilitation Office: [phone]   Myrla Halsted 07/21/2023, 5:07 PM

## 2023-07-22 DIAGNOSIS — D649 Anemia, unspecified: Secondary | ICD-10-CM | POA: Diagnosis not present

## 2023-07-22 DIAGNOSIS — K5731 Diverticulosis of large intestine without perforation or abscess with bleeding: Secondary | ICD-10-CM

## 2023-07-22 DIAGNOSIS — K922 Gastrointestinal hemorrhage, unspecified: Secondary | ICD-10-CM | POA: Diagnosis not present

## 2023-07-22 LAB — CBC
HCT: 26.1 % — ABNORMAL LOW (ref 36.0–46.0)
Hemoglobin: 8.1 g/dL — ABNORMAL LOW (ref 12.0–15.0)
MCH: 25 pg — ABNORMAL LOW (ref 26.0–34.0)
MCHC: 31 g/dL (ref 30.0–36.0)
MCV: 80.6 fL (ref 80.0–100.0)
Platelets: 377 10*3/uL (ref 150–400)
RBC: 3.24 MIL/uL — ABNORMAL LOW (ref 3.87–5.11)
RDW: 21.4 % — ABNORMAL HIGH (ref 11.5–15.5)
WBC: 9.2 10*3/uL (ref 4.0–10.5)
nRBC: 0.2 % (ref 0.0–0.2)

## 2023-07-22 LAB — URINALYSIS, ROUTINE W REFLEX MICROSCOPIC
Bilirubin Urine: NEGATIVE
Glucose, UA: NEGATIVE mg/dL
Hgb urine dipstick: NEGATIVE
Ketones, ur: NEGATIVE mg/dL
Leukocytes,Ua: NEGATIVE
Nitrite: NEGATIVE
Protein, ur: NEGATIVE mg/dL
Specific Gravity, Urine: 1.006 (ref 1.005–1.030)
pH: 5 (ref 5.0–8.0)

## 2023-07-22 LAB — BASIC METABOLIC PANEL
Anion gap: 6 (ref 5–15)
BUN: 5 mg/dL — ABNORMAL LOW (ref 8–23)
CO2: 28 mmol/L (ref 22–32)
Calcium: 9 mg/dL (ref 8.9–10.3)
Chloride: 105 mmol/L (ref 98–111)
Creatinine, Ser: 0.75 mg/dL (ref 0.44–1.00)
GFR, Estimated: >60 mL/min (ref >60)
Glucose, Bld: 120 mg/dL — ABNORMAL HIGH (ref 70–99)
Potassium: 3.4 mmol/L — ABNORMAL LOW (ref 3.5–5.1)
Sodium: 139 mmol/L (ref 135–145)

## 2023-07-22 LAB — GLUCOSE, CAPILLARY
Glucose-Capillary: 131 mg/dL — ABNORMAL HIGH (ref 70–99)
Glucose-Capillary: 126 mg/dL — ABNORMAL HIGH (ref 70–99)

## 2023-07-22 LAB — MAGNESIUM: Magnesium: 1.7 mg/dL (ref 1.7–2.4)

## 2023-07-22 MED ORDER — FERROUS SULFATE 325 (65 FE) MG PO TBEC: 325.0000 mg | DELAYED_RELEASE_TABLET | ORAL | 3 refills | Status: DC

## 2023-07-22 NOTE — Discharge Summary (Signed)
Carol Mueller HYQ:[id number] DOB: Apr 29, 1961 DOA: 07/19/2023  PCP: [name], DO  Admit date: 07/19/2023 Discharge date: 07/22/2023  Time spent: 35 minutes  Recommendations for Outpatient Follow-up:  Pcp and GI f/u     Discharge Diagnoses:  Active Problems:   Acute lower GI bleeding   Hypokalemia   Essential hypertension   Type 2 diabetes mellitus with hyperlipidemia (HCC)   Class 3 obesity (HCC)   GI bleed   Anemia   Diverticulosis   Discharge Condition: stable  Diet recommendation: carb modified  Filed Weights   07/19/23 1025 07/21/23 0757  Weight: 105.7 kg 105.7 kg    History of present illness:  From admission h and p Carol Mueller is a 62 y.o. female with medical history significant of colonic diverticulosis, hypertension, T2DM, and obesity who presented with bright red blood per rectum.    Recent hospitalization 08/21 to 07/15/23 for colonic diverticular bleed. She did require 3 units PRBC transfusion for anemia of acute blood loss. Work up with abdominal pelvic CT angiography was negative for active bleeding. Bleeding subsided and patient was discharged home. At the time of her discharge her hgb was 8.2    Patient has been doing well at home until yesterday when she noticed [name] red stools while moving her bowels along with blood in the tissue paper. She only moved her stools one time during the morning hours. The rest of the day she she remained at home, she endorsed feeling generalized weakness. She purposefully had less po intake than her normal because she was afraid of worsening bleeding. This morning she moved her bowels again, but this time had frank bright red per rectum.  No abdominal pain, but positive dizziness when seating or standing. No syncope.    Because of worsening bleeding she came to the hospital for further evaluation.  Denies using aspirin or any non steroidal antiinflammatory agent over the counter.    She had diverticular bleed in the  past.     Hospital Course:  Patient with a history of presumed diverticular bleeds including recent hospitalization for such re-presents with hematochezia and anemia, transfused 1 unit. Underwent CTA which did not reveal source of bleed and also colonoscopy which was negative for active bleed. It appears her bleeding has resolved, her hgb is stable. Advise pcp and GI f/u.  Procedures: colonoscopy   Consultations: GI  Discharge Exam: Vitals:   07/22/23 0436 07/22/23 0725  BP: 130/62 (!) 128/59  Pulse: 80 80  Resp: 18 20  Temp: 98.6 F (37 C) 98.8 F (37.1 C)  SpO2: 96% 97%    General: NAD Cardiovascular: RRR Respiratory: CTAB Abdomen: soft, non-tender  Discharge Instructions   Discharge Instructions     Diet - low sodium heart healthy   Complete by: As directed    Increase activity slowly   Complete by: As directed       Allergies as of 07/22/2023       Reactions   Norvasc [amlodipine Besylate] Swelling   ANGIOEDEMA   Nsaids Other (See Comments)   GI upset/ GI bleed   Sulfa Antibiotics Other (See Comments)   Unknown reaction as a child        Medication List     TAKE these medications    acetaminophen 500 MG tablet Commonly known as: TYLENOL Take 1,000 mg by mouth every 6 (six) hours as needed for mild pain.   atorvastatin 10 MG tablet Commonly known as: LIPITOR Take 10 mg by mouth  daily.   BD Pen Needle Nano 2nd Gen 32G X 4 MM Misc Generic drug: Insulin Pen Needle Inject 1 Needle as directed daily.   carvedilol 25 MG tablet Commonly known as: COREG Take 25 mg by mouth 2 (two) times daily.   dicyclomine 20 MG tablet Commonly known as: BENTYL Take 1 tablet (20 mg total) by mouth 2 (two) times daily for 7 days.   ferrous sulfate 325 (65 FE) MG EC tablet Take 1 tablet (325 mg total) by mouth every other day.   Jardiance 10 MG Tabs tablet Generic drug: empagliflozin Take 10 mg by mouth daily.   metFORMIN 500 MG tablet Commonly known as:  GLUCOPHAGE Take 500 mg by mouth 2 (two) times daily with a meal.   methocarbamol 500 MG tablet Commonly known as: ROBAXIN Take 500 mg by mouth every 6 (six) hours as needed for muscle spasms.   multivitamin with minerals tablet Take 1 tablet by mouth daily.   NovoLOG Mix 70/30 FlexPen (70-30) 100 UNIT/ML FlexPen Generic drug: insulin aspart protamine - aspart Inject 0-30 Units into the skin 2 (two) times daily. Dose per sliding scale.   olmesartan 40 MG tablet Commonly known as: BENICAR Take 40 mg by mouth daily.   pantoprazole 20 MG tablet Commonly known as: PROTONIX Take 1 tablet (20 mg total) by mouth daily for 14 days.   Semaglutide(0.25 or 0.5MG /DOS) 2 MG/3ML Sopn Inject 0.5 mg into the skin every Sunday.   Vitamin D 50 MCG (2000 UT) Caps Take 2,000 Units by mouth daily.       Allergies  Allergen Reactions   Norvasc [Amlodipine Besylate] Swelling    ANGIOEDEMA   Nsaids Other (See Comments)    GI upset/ GI bleed   Sulfa Antibiotics Other (See Comments)    Unknown reaction as a child    Follow-up Information     [name], DO Follow up.   Specialty: Family Medicine Contact information: [address] [place] [address] [phone]         Meredith Pel, NP Follow up.   Specialty: Gastroenterology Contact information: [address] [place] [address] [phone]                  The results of significant diagnostics from this hospitalization (including imaging, microbiology, ancillary and laboratory) are listed below for reference.    Significant Diagnostic Studies: DG CHEST PORT 1 VIEW  Result Date: 07/21/2023 CLINICAL DATA:  Shortness of breath, chest pressure EXAM: PORTABLE CHEST 1 VIEW COMPARISON:  10/23/2020 FINDINGS: The cardiomediastinal contours are normal. The lungs are clear. Pulmonary vasculature is normal. No consolidation, pleural effusion, or pneumothorax. No acute osseous abnormalities are  seen. IMPRESSION: No acute chest findings. Electronically Signed   By: Narda Rutherford M.D.   On: 07/21/2023 23:28   CT ANGIO GI BLEED  Result Date: 07/20/2023 CLINICAL DATA:  Ongoing bloody bowel movements. EXAM: CTA ABDOMEN AND PELVIS WITHOUT AND WITH CONTRAST TECHNIQUE: Multidetector CT imaging of the abdomen and pelvis was performed using the standard protocol during bolus administration of intravenous contrast. Multiplanar reconstructed images and MIPs were obtained and reviewed to evaluate the vascular anatomy. RADIATION DOSE REDUCTION: This exam was performed according to the departmental dose-optimization program which includes automated exposure control, adjustment of the mA and/or kV according to patient size and/or use of iterative reconstruction technique. CONTRAST:  OMNIPAQUE IOHEXOL 350 MG/ML SOLN COMPARISON:  Multiple prior CTs back to 2016. The 2 most recent are  CT with IV contrast yesterday at 12:02 p.m., CTA abdomen and pelvis GI bleed protocol 07/09/2023. FINDINGS: VASCULAR Aorta: Normal caliber aorta without aneurysm, dissection, vasculitis or significant stenosis. Minimal scattered calcific plaques. Celiac: Normal.  No branch occlusions. SMA: Normal. No branch occlusions. Widely patent replaced right hepatic artery arises at the vessel inflection point. Renals: Both renal arteries are patent without evidence of aneurysm, dissection, vasculitis, fibromuscular dysplasia or significant stenosis. IMA: Normal.  No branch occlusions. Inflow: Patent without evidence of aneurysm, dissection, vasculitis or significant stenosis. Proximal Outflow: Bilateral common femoral and visualized portions of the superficial and profunda femoral arteries are patent without evidence of aneurysm, dissection, vasculitis or significant stenosis. Veins: Patent.  No portal vein dilatation. Review of the MIP images confirms the above findings. NON-VASCULAR Lower chest: Lung bases are clear of infiltrates. The  heart is slightly enlarged. On the noncontrasted exam the intraventricular blood is lower in density than the myocardium. Findings consistent with anemia. Hepatobiliary: The liver is mildly steatotic. There is mild irregularity in the contour of the left lobe which may be seen with early cirrhosis. Gallbladder is absent without biliary dilatation. Pancreas: No abnormality. Spleen: No abnormality.  No splenomegaly. Adrenals/Urinary Tract: Status post right adrenalectomy. Normal left adrenal. Bilateral Bosniak 1 and 2 renal cysts are redemonstrated. No follow-up imaging is recommended. There is no mass enhancement, no urinary stone or obstruction. There is contrast in the bladder. There is no bladder thickening. Stomach/Bowel: There is no visible arterial or venous contrast extravasation into the GI tract. The unopacified stomach and small bowel are unremarkable. The appendix is normal. There is diffuse colonic diverticulosis without evidence of an acute diverticulitis. Lymphatic: No lymphadenopathy is seen. Reproductive: Enlarged lobular fibroid uterus with multiple calcified and partially calcified fibroids. No adnexal mass is seen. Other: Small umbilical and inguinal fat hernias. No incarcerated hernias. No free fluid, free hemorrhage or free air. Musculoskeletal: L4-5 with advanced facet hypertrophy, minimal grade 1 anterolisthesis and moderate acquired spinal stenosis. Chronic sacroiliitis. Additional degenerative change at L5-S1. Degenerative discs lower thoracic spine. No acute or significant osseous findings. IMPRESSION: 1. No visible arterial or venous contrast extravasation into the GI tract. 2. Minimal aortic atherosclerosis. No visceral artery stenoses or branch occlusions. 3. Diffuse colonic diverticulosis without evidence of acute diverticulitis. 4. Mild hepatic steatosis with mild irregularity in the left lobe which may be seen with early cirrhosis. No splenomegaly or ascites. 5. Fibroid uterus. 6.  Umbilical and inguinal fat hernias. 7. Likely anemia. 8. Additional findings described above. Aortic Atherosclerosis (ICD10-I70.0). Electronically Signed   By: Almira Bar M.D.   On: 07/20/2023 01:22   CT ABDOMEN PELVIS W CONTRAST  Result Date: 07/19/2023 CLINICAL DATA:  Left lower quadrant abdominal pain. EXAM: CT ABDOMEN AND PELVIS WITH CONTRAST TECHNIQUE: Multidetector CT imaging of the abdomen and pelvis was performed using the standard protocol following bolus administration of intravenous contrast. RADIATION DOSE REDUCTION: This exam was performed according to the departmental dose-optimization program which includes automated exposure control, adjustment of the mA and/or kV according to patient size and/or use of iterative reconstruction technique. CONTRAST:  75mL OMNIPAQUE IOHEXOL 350 MG/ML SOLN COMPARISON:  Abdominal CT 07/09/2023 and 07/07/2023. FINDINGS: Lower chest: The visualized lower chest appears unchanged, without acute or significant findings. Hepatobiliary: The liver is normal in density without suspicious focal abnormality. No evidence of biliary dilatation status post cholecystectomy. Pancreas: Unremarkable. No pancreatic ductal dilatation or surrounding inflammatory changes. Spleen: Normal in size without focal abnormality. Adrenals/Urinary Tract: Stable postsurgical changes from  previous right adrenalectomy. The left adrenal gland appears normal. No evidence of urinary tract calculus, suspicious renal lesion or hydronephrosis. Simple and mildly complex renal cysts are noted bilaterally which are similar to the previous study. In the upper pole of the left kidney, there is a 1.6 cm lesion with a density of 61 HU, and in the interpolar region of the left kidney, there is a 1.2 cm lesion with a density of 42 HU. These did not appear to be enhancing on the most recent CT which was performed without and with contrast and are consistent with Bosniak 2 cysts. No specific follow-up imaging  recommended. The bladder appears unremarkable for its degree of distention. Stomach/Bowel: No enteric contrast administered. The stomach appears unremarkable for its degree of distension. No evidence of bowel wall thickening, distention or surrounding inflammatory change. The appendix appears normal. There are diverticular changes throughout the colon. Vascular/Lymphatic: There are no enlarged abdominal or pelvic lymph nodes. Minimal atherosclerosis. No acute vascular findings or aneurysm. Reproductive: Numerous calcified and exophytic uterine fibroids are grossly stable. No suspicious adnexal findings. Other: Stable small periumbilical hernia containing only fat. No ascites or pneumoperitoneum. Musculoskeletal: No acute or significant osseous findings. Multilevel lumbar spondylosis. Unless specific follow-up recommendations are mentioned in the findings or impression sections, no imaging follow-up of any mentioned incidental findings is recommended. IMPRESSION: 1. No acute findings or explanation for the patient's symptoms. 2. Diffuse colonic diverticulosis without evidence of acute inflammation. 3. 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 4. Stable uterine fibroids. Electronically Signed   By: Carey Bullocks M.D.   On: 07/19/2023 12:32   CT ANGIO GI BLEED  Result Date: 07/09/2023 CLINICAL DATA:  Rectal bleeding and generalized abdominal pain for several days. EXAM: CTA ABDOMEN AND PELVIS WITHOUT AND WITH CONTRAST TECHNIQUE: Multidetector CT imaging of the abdomen and pelvis was performed using the standard protocol during bolus administration of intravenous contrast. Multiplanar reconstructed images and MIPs were obtained and reviewed to evaluate the vascular anatomy. RADIATION DOSE REDUCTION: This exam was performed according to the departmental dose-optimization program which includes automated exposure control, adjustment of the mA and/or kV according to patient size and/or use of  iterative reconstruction technique. CONTRAST:  OMNIPAQUE IOHEXOL 350 MG/ML SOLN COMPARISON:  July 07, 2023. FINDINGS: VASCULAR Aorta: Normal caliber aorta without aneurysm, dissection, vasculitis or significant stenosis. Celiac: Patent without evidence of aneurysm, dissection, vasculitis or significant stenosis. SMA: Patent without evidence of aneurysm, dissection, vasculitis or significant stenosis. Renals: Both renal arteries are patent without evidence of aneurysm, dissection, vasculitis, fibromuscular dysplasia or significant stenosis. IMA: Patent without evidence of aneurysm, dissection, vasculitis or significant stenosis. Inflow: Patent without evidence of aneurysm, dissection, vasculitis or significant stenosis. Proximal Outflow: Bilateral common femoral and visualized portions of the superficial and profunda femoral arteries are patent without evidence of aneurysm, dissection, vasculitis or significant stenosis. Veins: No obvious venous abnormality within the limitations of this arterial phase study. Review of the MIP images confirms the above findings. NON-VASCULAR Lower chest: No acute abnormality. Hepatobiliary: No focal liver abnormality is seen. Status post cholecystectomy. No biliary dilatation. Pancreas: Unremarkable. No pancreatic ductal dilatation or surrounding inflammatory changes. Spleen: Normal in size without focal abnormality. Adrenals/Urinary Tract: Status post right adrenalectomy. Left adrenal gland is unremarkable. Stable bilateral renal cysts are noted for which no further follow-up is required. No hydronephrosis or renal obstruction is noted. Urinary bladder is unremarkable. Stomach/Bowel: The stomach is unremarkable. There is no evidence of bowel obstruction or inflammation. The appendix appears  normal. Diverticulosis is noted throughout the colon. No definite evidence of gastrointestinal bleeding is noted. Lymphatic: No adenopathy is noted. Reproductive: Multiple calcified  uterine fibroids are noted. No adnexal abnormality. Other: Small fat containing periumbilical hernia. No abdominopelvic ascites. Musculoskeletal: No acute or significant osseous findings. IMPRESSION: VASCULAR No definite evidence of active gastrointestinal bleeding. NON-VASCULAR Diffuse colonic diverticulosis is noted without inflammation. Multiple calcified uterine fibroids are noted. Electronically Signed   By: Lupita Raider M.D.   On: 07/09/2023 13:58   CT Angio Abd/Pel W and/or Wo Contrast  Result Date: 07/07/2023 CLINICAL DATA:  History of GI bleed with several days of dark stools EXAM: CTA ABDOMEN AND PELVIS WITHOUT AND WITH CONTRAST TECHNIQUE: Multidetector CT imaging of the abdomen and pelvis was performed using the standard protocol during bolus administration of intravenous contrast. Multiplanar reconstructed images and MIPs were obtained and reviewed to evaluate the vascular anatomy. RADIATION DOSE REDUCTION: This exam was performed according to the departmental dose-optimization program which includes automated exposure control, adjustment of the mA and/or kV according to patient size and/or use of iterative reconstruction technique. CONTRAST:  OMNIPAQUE IOHEXOL 350 MG/ML SOLN COMPARISON:  CTA abdomen and pelvis dated 07/08/2021 FINDINGS: VASCULAR Aorta: Aortic atherosclerosis. Normal caliber aorta without aneurysm, dissection, vasculitis or significant stenosis. Celiac: Patent without evidence of aneurysm, dissection, vasculitis or significant stenosis. SMA: Patent without evidence of aneurysm, dissection, vasculitis or significant stenosis. Renals: Both renal arteries are patent without evidence of aneurysm, dissection, vasculitis, fibromuscular dysplasia or significant stenosis. IMA: Patent without evidence of aneurysm, dissection, vasculitis or significant stenosis. Inflow: Patent without evidence of aneurysm, dissection, vasculitis or significant stenosis. Proximal Outflow: Bilateral  common femoral and visualized portions of the superficial and profunda femoral arteries are patent without evidence of aneurysm, dissection, vasculitis or significant stenosis. Veins: No obvious venous abnormality within the limitations of this arterial phase study. Review of the MIP images confirms the above findings. NON-VASCULAR Lower chest: No focal consolidation or pulmonary nodule in the lung bases. No pleural effusion or pneumothorax demonstrated. Partially imaged heart size is normal. Hepatobiliary: No focal hepatic lesions. No intra or extrahepatic biliary ductal dilation. Cholecystectomy. Pancreas: No focal lesions or main ductal dilation. Spleen: Normal in size without focal abnormality. 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 No left adrenal nodules. No hydronephrosis or calculi. Bilateral simple cysts. Additional hypoattenuating foci do not demonstrate appreciable enhancement, likely hemorrhagic/proteinaceous cyst. No specific follow-up imaging recommended. No focal bladder wall thickening. Stomach/Bowel: Normal appearance of the stomach. No evidence of bowel wall thickening, distention, or inflammatory changes. Extensive colonic diverticulosis without acute diverticulitis. Normal appendix. Lymphatic: No enlarged abdominal or pelvic lymph nodes. Reproductive: Multifocal exophytic masses arising from the uterus with coarse heterogeneous calcifications, likely leiomyomas. No adnexal masses. Other: No free fluid, fluid collection, or free air. Musculoskeletal: No acute or abnormal lytic or blastic osseous lesions. Multilevel degenerative changes of the partially imaged thoracic and lumbar spine. Small fat-containing right inguinal hernia. IMPRESSION: 1. No evidence of active GI bleed. 2. Extensive colonic diverticulosis without acute diverticulitis. 3.  Aortic Atherosclerosis (ICD10-I70.0). Electronically Signed   By: Agustin Cree M.D.   On: 07/07/2023 18:34     Microbiology: No results found for this or any previous visit (from the past 240 hour(s)).   Labs: Basic Metabolic Panel: Recent Labs  Lab 07/19/23 1052 07/20/23 0107 07/21/23 0500 07/22/23 1124  NA 135 140 142 139  K 3.2* 3.9 3.6 3.4*  CL 101 105 109 105  CO2 26 25 25  28  GLUCOSE 91 107* 116* 120*  BUN 6* 9 <5* 5*  CREATININE 0.76 0.76 0.79 0.75  CALCIUM 8.3* 8.2* 8.4* 9.0  MG  --  1.5* 1.9 1.7   Liver Function Tests: Recent Labs  Lab 07/19/23 1052  AST 14*  ALT 13  ALKPHOS 48  BILITOT 0.8  PROT 6.2*  ALBUMIN 3.0*   Recent Labs  Lab 07/19/23 1052  LIPASE 26   No results for input(s): "AMMONIA" in the last 168 hours. CBC: Recent Labs  Lab 07/19/23 1052 07/20/23 0107 07/20/23 0839 07/21/23 0500 07/22/23 1124  WBC 7.4 9.7  --  8.9 9.2  NEUTROABS 4.7  --   --   --   --   HGB 9.2* 6.6* 8.1* 7.8* 8.1*  HCT 30.0* 21.3* 25.7* 24.2* 26.1*  MCV 78.5* 76.3*  --  79.9* 80.6  PLT 408* 309  --  322 377   Cardiac Enzymes: No results for input(s): "CKTOTAL", "CKMB", "CKMBINDEX", "TROPONINI" in the last 168 hours. BNP: BNP (last 3 results) No results for input(s): "BNP" in the last 8760 hours.  ProBNP (last 3 results) No results for input(s): "PROBNP" in the last 8760 hours.  CBG: Recent Labs  Lab 07/21/23 1153 07/21/23 1630 07/21/23 2008 07/22/23 0641 07/22/23 1227  GLUCAP 151* 128* 135* 131* 126*       Signed:  Silvano Bilis MD.  [facility] 07/22/2023, 1:49 PM

## 2023-07-22 NOTE — Anesthesia Postprocedure Evaluation (Signed)
Anesthesia Post Note  Patient: Carol Mueller  Procedure(s) Performed: COLONOSCOPY WITH PROPOFOL     Patient location during evaluation: Endoscopy Anesthesia Type: MAC Level of consciousness: awake and alert Pain management: pain level controlled Vital Signs Assessment: post-procedure vital signs reviewed and stable Respiratory status: spontaneous breathing, nonlabored ventilation, respiratory function stable and patient connected to nasal cannula oxygen Cardiovascular status: stable and blood pressure returned to baseline Postop Assessment: no apparent nausea or vomiting Anesthetic complications: no   No notable events documented.  Last Vitals:  Vitals:   07/22/23 0436 07/22/23 0725  BP: 130/62 (!) 128/59  Pulse: 80 80  Resp: 18 20  Temp: 37 C 37.1 C  SpO2: 96% 97%    Last Pain:  Vitals:   07/22/23 0800  TempSrc:   PainSc: 0-No pain                 Walterine Amodei

## 2023-07-22 NOTE — Progress Notes (Addendum)
  At 1500:  Patient discharged.  Removed PIV.  Reviewed discharge instructions, medications and follow up appts with patient.  Prescriptions were sent to patient's pharmacy.  Answered questions. Gave patient copy of discharge instructions.    No additional questions or concerns at this time.    Wheeled patient down to main entrance.  Patient ordered a LIFT ride.

## 2023-07-22 NOTE — Progress Notes (Signed)
Progress Note   Subjective  Patient doing well. She has not had any BMs or bleeding since the procedure. No bleeding since overnight the night prior with the prep. Hgb pending this AM. She is tolerating a diet. She does complain of increased urinary frequency and some pelvic cramping.   Objective   Vital signs in last 24 hours: Temp:  [97 F (36.1 C)-98.8 F (37.1 C)] 98.8 F (37.1 C) (09/05 0725) Pulse Rate:  [78-86] 80 (09/05 0725) Resp:  [18-20] 18 (09/05 0436) BP: (101-153)/(59-83) 128/59 (09/05 0725) SpO2:  [96 %-99 %] 97 % (09/05 0725) Last BM Date : 07/21/23 General:    AA female in NAD Neurologic:  Alert and oriented,  grossly normal neurologically. Psych:  Cooperative. Normal mood and affect.  Intake/Output from previous day: 09/04 0701 - 09/05 0700 In: 540 [P.O.:240; I.V.:300] Out: -  Intake/Output this shift: No intake/output data recorded.  Lab Results: Recent Labs    07/19/23 1052 07/20/23 0107 07/20/23 0839 07/21/23 0500  WBC 7.4 9.7  --  8.9  HGB 9.2* 6.6* 8.1* 7.8*  HCT 30.0* 21.3* 25.7* 24.2*  PLT 408* 309  --  322   BMET Recent Labs    07/19/23 1052 07/20/23 0107 07/21/23 0500  NA 135 140 142  K 3.2* 3.9 3.6  CL 101 105 109  CO2 26 25 25   GLUCOSE 91 107* 116*  BUN 6* 9 <5*  CREATININE 0.76 0.76 0.79  CALCIUM 8.3* 8.2* 8.4*   LFT Recent Labs    07/19/23 1052  PROT 6.2*  ALBUMIN 3.0*  AST 14*  ALT 13  ALKPHOS 48  BILITOT 0.8   PT/INR Recent Labs    07/19/23 1052  LABPROT 12.8  INR 0.9    Studies/Results: DG CHEST PORT 1 VIEW  Result Date: 07/21/2023 CLINICAL DATA:  Shortness of breath, chest pressure EXAM: PORTABLE CHEST 1 VIEW COMPARISON:  10/23/2020 FINDINGS: The cardiomediastinal contours are normal. The lungs are clear. Pulmonary vasculature is normal. No consolidation, pleural effusion, or pneumothorax. No acute osseous abnormalities are seen. IMPRESSION: No acute chest findings. Electronically Signed   By:  Narda Rutherford M.D.   On: 07/21/2023 23:28       Assessment / Plan:    62 y/o female here with the following:  Lower GI bleed - likely due to diverticulosis Post - hemorrhagic anemia  Colonoscopy yesterday showed pancolonic diverticulosis. Highest burden in the sigmoid colon. Prep was good, no blood or heme noted anywhere. She stopped bleeding with the prep. I could not identify any culprit lesion otherwise or stigmata for recent bleeding in the colon.  I suspect she very likely had diverticular bleeding causing this presentation. We discussed what this is. Stuttering bleeding over recent weeks which has been very frustrating for her. Hopefully at this point it has resolved, no bleeding for more than a day now and tolerating a diet.   Labs pending from this AM, assuming Hgb is stable she can go home later today. This could recur again in the future and she should contact us if this recurs. Can advance to regular diet otherwise. In regards to urinary frequency could check a UA, defer to primary service.  PLAN: - advance to regular diet - await Hgb from this AM  - assuming Hgb is stable, I think can go home later today - defer to primary team workup for increased urinary frequency, consider UA  We will sign off for now, call with questions or  changes in her status moving forward.  Harlin Rain, MD [facility]

## 2023-07-23 LAB — BPAM RBC
Blood Product Expiration Date: 202409262359
Blood Product Expiration Date: 202409262359
Blood Product Expiration Date: 202409262359
ISSUE DATE / TIME: 202409010735
ISSUE DATE / TIME: 202409010735
ISSUE DATE / TIME: 202409030325
Unit Type and Rh: 5100
Unit Type and Rh: 5100
Unit Type and Rh: 5100

## 2023-07-23 LAB — TYPE AND SCREEN
ABO/RH(D): O POS
Antibody Screen: NEGATIVE
Unit division: 0
Unit division: 0
Unit division: 0

## 2023-07-26 ENCOUNTER — Encounter (HOSPITAL_COMMUNITY): Payer: Self-pay | Admitting: Gastroenterology

## 2023-07-26 ENCOUNTER — Other Ambulatory Visit (INDEPENDENT_AMBULATORY_CARE_PROVIDER_SITE_OTHER): Payer: No Typology Code available for payment source

## 2023-07-26 DIAGNOSIS — K625 Hemorrhage of anus and rectum: Secondary | ICD-10-CM | POA: Diagnosis not present

## 2023-07-26 LAB — CBC WITH DIFFERENTIAL/PLATELET
Basophils Absolute: 0.1 10*3/uL (ref 0.0–0.1)
Basophils Relative: 1 % (ref 0.0–3.0)
Eosinophils Absolute: 0.3 10*3/uL (ref 0.0–0.7)
Eosinophils Relative: 4.5 % (ref 0.0–5.0)
HCT: 28.4 % — ABNORMAL LOW (ref 36.0–46.0)
Hemoglobin: 8.9 g/dL — ABNORMAL LOW (ref 12.0–15.0)
Lymphocytes Relative: 22.4 % (ref 12.0–46.0)
Lymphs Abs: 1.7 10*3/uL (ref 0.7–4.0)
MCHC: 31.4 g/dL (ref 30.0–36.0)
MCV: 78.6 fl (ref 78.0–100.0)
Monocytes Absolute: 0.6 10*3/uL (ref 0.1–1.0)
Monocytes Relative: 8.1 % (ref 3.0–12.0)
Neutro Abs: 4.9 10*3/uL (ref 1.4–7.7)
Neutrophils Relative %: 64 % (ref 43.0–77.0)
Platelets: 420 10*3/uL — ABNORMAL HIGH (ref 150.0–400.0)
RBC: 3.61 Mil/uL — ABNORMAL LOW (ref 3.87–5.11)
RDW: 22.5 % — ABNORMAL HIGH (ref 11.5–15.5)
WBC: 7.7 10*3/uL (ref 4.0–10.5)

## 2023-08-11 ENCOUNTER — Telehealth: Payer: Self-pay | Admitting: Gastroenterology

## 2023-08-11 MED ORDER — DICYCLOMINE HCL 20 MG PO TABS: 20.0000 mg | ORAL_TABLET | Freq: Three times a day (TID) | ORAL | 0 refills | Status: DC | PRN

## 2023-08-11 NOTE — Telephone Encounter (Signed)
Patient called to request an extension for the Bentyl medication. Please advise.

## 2023-08-11 NOTE — Telephone Encounter (Signed)
Refill for Bentyl sent to [facility]

## 2023-08-24 ENCOUNTER — Other Ambulatory Visit: Payer: Self-pay

## 2023-08-24 ENCOUNTER — Other Ambulatory Visit (INDEPENDENT_AMBULATORY_CARE_PROVIDER_SITE_OTHER): Payer: No Typology Code available for payment source

## 2023-08-24 DIAGNOSIS — D649 Anemia, unspecified: Secondary | ICD-10-CM

## 2023-08-24 LAB — CBC WITH DIFFERENTIAL/PLATELET
Basophils Absolute: 0 10*3/uL (ref 0.0–0.1)
Basophils Relative: 0.7 % (ref 0.0–3.0)
Eosinophils Absolute: 0.2 10*3/uL (ref 0.0–0.7)
Eosinophils Relative: 2.4 % (ref 0.0–5.0)
HCT: 34.1 % — ABNORMAL LOW (ref 36.0–46.0)
Hemoglobin: 10.5 g/dL — ABNORMAL LOW (ref 12.0–15.0)
Lymphocytes Relative: 30.6 % (ref 12.0–46.0)
Lymphs Abs: 2.1 10*3/uL (ref 0.7–4.0)
MCHC: 30.7 g/dL (ref 30.0–36.0)
MCV: 74.2 fL — ABNORMAL LOW (ref 78.0–100.0)
Monocytes Absolute: 0.6 10*3/uL (ref 0.1–1.0)
Monocytes Relative: 9 % (ref 3.0–12.0)
Neutro Abs: 4 10*3/uL (ref 1.4–7.7)
Neutrophils Relative %: 57.3 % (ref 43.0–77.0)
Platelets: 490 10*3/uL — ABNORMAL HIGH (ref 150.0–400.0)
RBC: 4.59 Mil/uL (ref 3.87–5.11)
RDW: 19.9 % — ABNORMAL HIGH (ref 11.5–15.5)
WBC: 6.9 10*3/uL (ref 4.0–10.5)

## 2023-11-04 ENCOUNTER — Ambulatory Visit: Payer: No Typology Code available for payment source | Admitting: Gastroenterology

## 2023-11-07 ENCOUNTER — Observation Stay (HOSPITAL_COMMUNITY)
Admission: EM | Admit: 2023-11-07 | Discharge: 2023-11-09 | Disposition: A | Discharge status: Still In Hospital | Payer: No Typology Code available for payment source | Attending: Emergency Medicine | Admitting: Emergency Medicine

## 2023-11-07 ENCOUNTER — Other Ambulatory Visit: Payer: Self-pay

## 2023-11-07 ENCOUNTER — Encounter (HOSPITAL_COMMUNITY): Payer: Self-pay | Admitting: Emergency Medicine

## 2023-11-07 ENCOUNTER — Emergency Department (HOSPITAL_COMMUNITY): Payer: No Typology Code available for payment source

## 2023-11-07 DIAGNOSIS — K5731 Diverticulosis of large intestine without perforation or abscess with bleeding: Principal | ICD-10-CM | POA: Insufficient documentation

## 2023-11-07 DIAGNOSIS — D509 Iron deficiency anemia, unspecified: Secondary | ICD-10-CM | POA: Diagnosis present

## 2023-11-07 DIAGNOSIS — Z79899 Other long term (current) drug therapy: Secondary | ICD-10-CM | POA: Insufficient documentation

## 2023-11-07 DIAGNOSIS — D500 Iron deficiency anemia secondary to blood loss (chronic): Secondary | ICD-10-CM | POA: Insufficient documentation

## 2023-11-07 DIAGNOSIS — E876 Hypokalemia: Secondary | ICD-10-CM | POA: Diagnosis present

## 2023-11-07 DIAGNOSIS — Z794 Long term (current) use of insulin: Secondary | ICD-10-CM | POA: Insufficient documentation

## 2023-11-07 DIAGNOSIS — E785 Hyperlipidemia, unspecified: Secondary | ICD-10-CM | POA: Diagnosis present

## 2023-11-07 DIAGNOSIS — E119 Type 2 diabetes mellitus without complications: Secondary | ICD-10-CM

## 2023-11-07 DIAGNOSIS — K922 Gastrointestinal hemorrhage, unspecified: Secondary | ICD-10-CM | POA: Diagnosis not present

## 2023-11-07 DIAGNOSIS — I10 Essential (primary) hypertension: Secondary | ICD-10-CM | POA: Insufficient documentation

## 2023-11-07 DIAGNOSIS — E1169 Type 2 diabetes mellitus with other specified complication: Secondary | ICD-10-CM | POA: Insufficient documentation

## 2023-11-07 DIAGNOSIS — E1159 Type 2 diabetes mellitus with other circulatory complications: Secondary | ICD-10-CM | POA: Diagnosis not present

## 2023-11-07 DIAGNOSIS — K625 Hemorrhage of anus and rectum: Secondary | ICD-10-CM | POA: Diagnosis present

## 2023-11-07 DIAGNOSIS — K5730 Diverticulosis of large intestine without perforation or abscess without bleeding: Secondary | ICD-10-CM | POA: Diagnosis present

## 2023-11-07 DIAGNOSIS — I152 Hypertension secondary to endocrine disorders: Secondary | ICD-10-CM | POA: Diagnosis present

## 2023-11-07 LAB — COMPREHENSIVE METABOLIC PANEL
ALT: 13 U/L (ref 0–44)
AST: 18 U/L (ref 15–41)
Albumin: 3.6 g/dL (ref 3.5–5.0)
Alkaline Phosphatase: 56 U/L (ref 38–126)
Anion gap: 9 (ref 5–15)
BUN: 8 mg/dL (ref 8–23)
CO2: 25 mmol/L (ref 22–32)
Calcium: 9.5 mg/dL (ref 8.9–10.3)
Chloride: 103 mmol/L (ref 98–111)
Creatinine, Ser: 0.77 mg/dL (ref 0.44–1.00)
GFR, Estimated: >60 mL/min (ref >60)
Glucose, Bld: 100 mg/dL — ABNORMAL HIGH (ref 70–99)
Potassium: 3.4 mmol/L — ABNORMAL LOW (ref 3.5–5.1)
Sodium: 137 mmol/L (ref 135–145)
Total Bilirubin: 0.5 mg/dL (ref <1.2)
Total Protein: 7.6 g/dL (ref 6.5–8.1)

## 2023-11-07 LAB — URINALYSIS, ROUTINE W REFLEX MICROSCOPIC
Bilirubin Urine: NEGATIVE
Glucose, UA: 150 mg/dL — AB
Hgb urine dipstick: NEGATIVE
Ketones, ur: NEGATIVE mg/dL
Leukocytes,Ua: NEGATIVE
Nitrite: NEGATIVE
Protein, ur: NEGATIVE mg/dL
Specific Gravity, Urine: 1.017 (ref 1.005–1.030)
pH: 5 (ref 5.0–8.0)

## 2023-11-07 LAB — CBC WITH DIFFERENTIAL/PLATELET
Abs Immature Granulocytes: 0 10*3/uL (ref 0.00–0.07)
Basophils Absolute: 0 10*3/uL (ref 0.0–0.1)
Basophils Relative: 0 %
Eosinophils Absolute: 0.5 10*3/uL (ref 0.0–0.5)
Eosinophils Relative: 5 %
HCT: 38.5 % (ref 36.0–46.0)
Hemoglobin: 11.7 g/dL — ABNORMAL LOW (ref 12.0–15.0)
Lymphocytes Relative: 24 %
Lymphs Abs: 2.5 10*3/uL (ref 0.7–4.0)
MCH: 20.7 pg — ABNORMAL LOW (ref 26.0–34.0)
MCHC: 30.4 g/dL (ref 30.0–36.0)
MCV: 68.1 fL — ABNORMAL LOW (ref 80.0–100.0)
Monocytes Absolute: 0.7 10*3/uL (ref 0.1–1.0)
Monocytes Relative: 7 %
Neutro Abs: 6.6 10*3/uL (ref 1.7–7.7)
Neutrophils Relative %: 64 %
Platelets: 364 10*3/uL (ref 150–400)
RBC: 5.65 MIL/uL — ABNORMAL HIGH (ref 3.87–5.11)
RDW: 19.4 % — ABNORMAL HIGH (ref 11.5–15.5)
WBC: 10.3 10*3/uL (ref 4.0–10.5)
nRBC: 0 % (ref 0.0–0.2)
nRBC: 0 /100{WBCs}

## 2023-11-07 LAB — CBC
HCT: 33.7 % — ABNORMAL LOW (ref 36.0–46.0)
Hemoglobin: 10.2 g/dL — ABNORMAL LOW (ref 12.0–15.0)
MCH: 20.4 pg — ABNORMAL LOW (ref 26.0–34.0)
MCHC: 30.3 g/dL (ref 30.0–36.0)
MCV: 67.3 fL — ABNORMAL LOW (ref 80.0–100.0)
Platelets: 334 10*3/uL (ref 150–400)
RBC: 5.01 MIL/uL (ref 3.87–5.11)
RDW: 18.6 % — ABNORMAL HIGH (ref 11.5–15.5)
WBC: 10.6 10*3/uL — ABNORMAL HIGH (ref 4.0–10.5)
nRBC: 0 % (ref 0.0–0.2)

## 2023-11-07 LAB — IRON AND TIBC
Iron: 36 ug/dL (ref 28–170)
Saturation Ratios: 9 % — ABNORMAL LOW (ref 10.4–31.8)
TIBC: 412 ug/dL (ref 250–450)
UIBC: 376 ug/dL

## 2023-11-07 LAB — LIPASE, BLOOD: Lipase: 32 U/L (ref 11–51)

## 2023-11-07 LAB — TYPE AND SCREEN
ABO/RH(D): O POS
Antibody Screen: NEGATIVE

## 2023-11-07 LAB — HEMOGLOBIN AND HEMATOCRIT, BLOOD
HCT: 33.6 % — ABNORMAL LOW (ref 36.0–46.0)
Hemoglobin: 10.2 g/dL — ABNORMAL LOW (ref 12.0–15.0)

## 2023-11-07 LAB — FERRITIN: Ferritin: 5 ng/mL — ABNORMAL LOW (ref 11–307)

## 2023-11-07 LAB — GLUCOSE, CAPILLARY: Glucose-Capillary: 78 mg/dL (ref 70–99)

## 2023-11-07 MED ORDER — ONDANSETRON HCL 4 MG/2ML IJ SOLN: 4.0000 mg | Freq: Four times a day (QID) | INTRAMUSCULAR | Status: DC | PRN

## 2023-11-07 MED ORDER — ACETAMINOPHEN 650 MG RE SUPP: 650.0000 mg | Freq: Four times a day (QID) | RECTAL | Status: DC | PRN

## 2023-11-07 MED ORDER — ONDANSETRON HCL 4 MG/2ML IJ SOLN
4.0000 mg | Freq: Once | INTRAMUSCULAR | Status: AC
  Administered 2023-11-07: 4 mg via INTRAVENOUS
  Filled 2023-11-07: qty 2

## 2023-11-07 MED ORDER — ATORVASTATIN CALCIUM 10 MG PO TABS
10.0000 mg | ORAL_TABLET | Freq: Every day | ORAL | Status: DC
  Administered 2023-11-08 – 2023-11-09 (×2): 10 mg via ORAL
  Filled 2023-11-07 (×2): qty 1

## 2023-11-07 MED ORDER — IOHEXOL 350 MG/ML SOLN
75.0000 mL | Freq: Once | INTRAVENOUS | Status: AC | PRN
  Administered 2023-11-07: 75 mL via INTRAVENOUS

## 2023-11-07 MED ORDER — CARVEDILOL 25 MG PO TABS
25.0000 mg | ORAL_TABLET | Freq: Two times a day (BID) | ORAL | Status: DC
  Administered 2023-11-08 – 2023-11-09 (×3): 25 mg via ORAL
  Filled 2023-11-07 (×3): qty 1

## 2023-11-07 MED ORDER — ACETAMINOPHEN 325 MG PO TABS
650.0000 mg | ORAL_TABLET | Freq: Four times a day (QID) | ORAL | Status: DC | PRN
  Administered 2023-11-07 – 2023-11-09 (×3): 650 mg via ORAL
  Filled 2023-11-07 (×3): qty 2

## 2023-11-07 MED ORDER — SODIUM CHLORIDE 0.9% FLUSH
3.0000 mL | Freq: Two times a day (BID) | INTRAVENOUS | Status: DC
  Administered 2023-11-07 – 2023-11-08 (×2): 3 mL via INTRAVENOUS

## 2023-11-07 MED ORDER — MORPHINE SULFATE (PF) 4 MG/ML IV SOLN
4.0000 mg | Freq: Once | INTRAVENOUS | Status: AC
  Administered 2023-11-07: 4 mg via INTRAVENOUS
  Filled 2023-11-07: qty 1

## 2023-11-07 MED ORDER — DICYCLOMINE HCL 20 MG PO TABS: 20.0000 mg | ORAL_TABLET | Freq: Three times a day (TID) | ORAL | Status: DC | PRN

## 2023-11-07 MED ORDER — POTASSIUM CHLORIDE 20 MEQ PO PACK
40.0000 meq | PACK | Freq: Once | ORAL | Status: AC
  Administered 2023-11-07: 40 meq via ORAL
  Filled 2023-11-07: qty 2

## 2023-11-07 MED ORDER — INSULIN ASPART 100 UNIT/ML IJ SOLN
0.0000 [IU] | Freq: Three times a day (TID) | INTRAMUSCULAR | Status: DC
  Administered 2023-11-09 (×2): 1 [IU] via SUBCUTANEOUS

## 2023-11-07 MED ORDER — ONDANSETRON HCL 4 MG PO TABS: 4.0000 mg | ORAL_TABLET | Freq: Four times a day (QID) | ORAL | Status: DC | PRN

## 2023-11-07 NOTE — H&P (Signed)
History and Physical    Carol Mueller ZOX:[id number] DOB: November 03, 1961 DOA: 11/07/2023  PCP: [name], DO  Patient coming from: Home  I have personally briefly reviewed patient's old medical records in [facility]  Chief Complaint: BRBPR  HPI: Carol Mueller is a 62 y.o. female with medical history significant for T2DM, HTN, HLD, pancolonic diverticulosis with recurrent diverticular bleeding who presented to the ED for evaluation of bright red blood per rectum.  Patient with recent history of lower GI bleeding, suspected due to stuttering diverticular bleed:  Admitted 07/07/23-07/15/23 for likely diverticular bleed.  Required 3 unit PRBC transfusion.  CT angiogram negative for active bleeding.  Hemoglobin stabilized at 8.2 on discharge.  Admitted 07/19/23-07/22/23 again for lower GI bleeding.  Required 1 unit PRBC transfusion.  CT angiogram again negative for source of bleeding.  Underwent colonoscopy 9/4 which showed pancolonic diverticulosis without evidence of active bleeding.  GI felt this was likely stuttering diverticular bleeding.  Hemoglobin 8.1 on discharge.  Patient states that she has had some abdominal cramping sensations last few days.  Last night she had some more intense upper abdominal discomfort.  Today she reported seeing bright red blood per rectum again.  This has been mixed in with the stool as well as bleeding on its own.  She says bleeding was fairly large-volume with initial few episodes earlier today but has since slowed down.  No further bloody bowel movements while in the ED.  She has been having some heartburn symptoms.  She denies lightheadedness, dizziness, fevers, chills, diaphoresis, chest pain, dyspnea.  She denies use of NSAIDs or blood thinners including aspirin.  ED Course  Labs/Imaging on admission: I have personally reviewed following labs and imaging studies.  Initial vitals showed BP 154/77, pulse 71, RR 17, temp 98.8 F, SpO2 98% on room  air.  Labs showed WBC 10.3, hemoglobin 11.7, platelets 364,000, sodium 137, potassium 3.4, bicarb 25, BUN 8, creatinine 0.77, serum glucose 100, LFTs within normal limits, lipase 32.  CT angio GI bleed study negative for evidence of mesenteric ischemia.  No intraluminal contrast extravasation to localize reported GI bleeding.  Pancolonic diverticulosis without evidence of acute diverticulitis noted.  Similar findings suggesting chronic sequela of prior diverticulitis also noted.  Patient was given IV morphine and Zofran.  EDP briefly discussed case with [facility] who felt patient can either be discharged home or monitored overnight.  Repeat CBC was obtained and showed hemoglobin decreased to 10.2.  The hospitalist service was consulted to admit for observation overnight.  Review of Systems: All systems reviewed and are negative except as documented in history of present illness above.   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   Diabetes mellitus without complication (HCC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   Diverticulitis    Diverticulosis    GI bleed    Hyperlipidemia    Hypertension    Polycystic ovary disease    Sepsis (HCC) 05/2019    Past Surgical History:  Procedure Laterality Date   ADRENALECTOMY  1996   BREAST BIOPSY Right 2018   BREAST BIOPSY Left    x2   CHOLECYSTECTOMY     COLONOSCOPY WITH PROPOFOL N/A 10/03/2019   Procedure: COLONOSCOPY WITH PROPOFOL;  Surgeon: Meryl Dare, MD;  Location: [facility] ENDOSCOPY;  Service: Endoscopy;  Laterality: N/A;   COLONOSCOPY WITH PROPOFOL N/A 11/22/2020   Procedure: COLONOSCOPY WITH PROPOFOL;  Surgeon:  Meryl Dare, MD;  Location: [facility];  Service: Endoscopy;  Laterality: N/A;   COLONOSCOPY WITH PROPOFOL N/A 07/21/2023   Procedure: COLONOSCOPY WITH PROPOFOL;  Surgeon: Benancio Deeds, MD;  Location: [facility] ENDOSCOPY;  Service: Gastroenterology;  Laterality: N/A;    ESOPHAGOGASTRODUODENOSCOPY (EGD) WITH PROPOFOL N/A 07/16/2015   Procedure: ESOPHAGOGASTRODUODENOSCOPY (EGD) WITH PROPOFOL;  Surgeon: Rachael Fee, MD;  Location: [facility] ENDOSCOPY;  Service: Endoscopy;  Laterality: N/A;   ESOPHAGOGASTRODUODENOSCOPY (EGD) WITH PROPOFOL N/A 11/28/2020   Procedure: ESOPHAGOGASTRODUODENOSCOPY (EGD) WITH PROPOFOL;  Surgeon: Shellia Cleverly, DO;  Location: [facility] ENDOSCOPY;  Service: Gastroenterology;  Laterality: N/A;   GIVENS CAPSULE STUDY N/A 11/28/2020   Procedure: GIVENS CAPSULE STUDY;  Surgeon: Shellia Cleverly, DO;  Location: [facility]OSCOPY;  Service: Gastroenterology;  Laterality: N/A;   POLYPECTOMY  10/03/2019   Procedure: POLYPECTOMY;  Surgeon: Meryl Dare, MD;  Location: [facility] ENDOSCOPY;  Service: Endoscopy;;   POLYPECTOMY  11/22/2020   Procedure: POLYPECTOMY;  Surgeon: Meryl Dare, MD;  Location: [facility] ENDOSCOPY;  Service: Endoscopy;;    Social History:  reports that she has never smoked. She has never used smokeless tobacco. She reports that she does not drink alcohol and does not use drugs.  Allergies  Allergen Reactions   Norvasc [Amlodipine Besylate] Swelling    ANGIOEDEMA   Nsaids Other (See Comments)    GI upset/ GI bleed   Sulfa Antibiotics Other (See Comments)    Unknown reaction as a child    Family History  Problem Relation Age of Onset   Hypertension Mother    Cancer Mother        ?uterine or ovarian cancer   Hypertension Father    Diabetes Father    Hypertension Sister    Diabetes Sister    Hypertension Brother    Diabetes Brother    Hypertension Maternal Grandfather    Diabetes Paternal Grandmother    Breast cancer Maternal Grandmother    Colon cancer Neg Hx      Prior to Admission medications   Medication Sig Start Date End Date Taking? Authorizing Provider  acetaminophen (TYLENOL) 500 MG tablet Take 1,000 mg by mouth every 6 (six) hours as needed for mild pain.    [provider]  atorvastatin (LIPITOR) 10 MG  tablet Take 10 mg by mouth daily. 03/06/19   [provider]  BD PEN NEEDLE NANO 2ND GEN 32G X 4 MM MISC Inject 1 Needle as directed daily. 07/15/23   [provider]  carvedilol (COREG) 25 MG tablet Take 25 mg by mouth 2 (two) times daily. 10/30/20   [provider]  Cholecalciferol (VITAMIN D) 50 MCG (2000 UT) CAPS Take 2,000 Units by mouth daily.    [provider]  dicyclomine (BENTYL) 20 MG tablet Take 1 tablet (20 mg total) by mouth every 8 (eight) hours as needed for spasms. 08/11/23   Meredith Pel, NP  empagliflozin (JARDIANCE) 10 MG TABS tablet Take 10 mg by mouth daily.    [provider]  ferrous sulfate 325 (65 FE) MG EC tablet Take 1 tablet (325 mg total) by mouth every other day. 07/22/23 07/21/24  Wouk, Wilfred Curtis, MD  insulin aspart protamine - aspart (NOVOLOG MIX 70/30 FLEXPEN) (70-30) 100 UNIT/ML FlexPen Inject 0-30 Units into the skin 2 (two) times daily. Dose per sliding scale.    [provider]  metFORMIN (GLUCOPHAGE) 500 MG tablet Take 500 mg by mouth 2 (two) times daily with a meal.  [provider]  methocarbamol (ROBAXIN) 500 MG tablet Take 500 mg by mouth every 6 (six) hours as needed for muscle spasms.    [provider]  Multiple Vitamins-Minerals (MULTIVITAMIN WITH MINERALS) tablet Take 1 tablet by mouth daily.    [provider]  olmesartan (BENICAR) 40 MG tablet Take 40 mg by mouth daily. 10/11/19   [provider]  pantoprazole (PROTONIX) 20 MG tablet Take 1 tablet (20 mg total) by mouth daily for 14 days. 07/19/23 08/02/23  Sloan Leiter, DO  Semaglutide,0.25 or 0.5MG /DOS, 2 MG/3ML SOPN Inject 0.5 mg into the skin every Sunday.    [provider]    Physical Exam: Vitals:   11/07/23 1118 11/07/23 1424 11/07/23 1845 11/07/23 2000  BP:  133/65 (!) 154/77 121/70  Pulse:  75 72 77  Resp:  17 17 20   Temp:  99 F (37.2 C) 98.8 F (37.1 C)   TempSrc:   Oral   SpO2:   98% 98% 90%  Weight: 99.3 kg     Height: 5\' 3"  (1.6 m)      Constitutional: Resting in bed, NAD, calm, comfortable Eyes: EOMI, exophthalmos, lids and conjunctivae normal ENMT: Mucous membranes are moist. Posterior pharynx clear of any exudate or lesions.Normal dentition.  Neck: normal, supple, no masses. Respiratory: clear to auscultation bilaterally, no wheezing, no crackles. Normal respiratory effort. No accessory muscle use.  Cardiovascular: Regular rate and rhythm, no murmurs / rubs / gallops. No extremity edema. 2+ pedal pulses. Abdomen: LUQ tenderness, no masses palpated. Musculoskeletal: no clubbing / cyanosis. No joint deformity upper and lower extremities. Good ROM, no contractures. Normal muscle tone.  Skin: no rashes, lesions, ulcers. No induration Neurologic: Sensation intact. Strength 5/5 in all 4.  Psychiatric: Normal judgment and insight. Alert and oriented x 3. Normal mood.   EKG: Not performed.  Assessment/Plan Principal Problem:   Acute lower GI bleeding Active Problems:   Pancolonic diverticulosis   Hypertension associated with diabetes (HCC)   Hyperlipidemia associated with type 2 diabetes mellitus (HCC)   Hypokalemia   Insulin-requiring or dependent type II diabetes mellitus (HCC)   Microcytic anemia   Carol Mueller is a 62 y.o. female with medical history significant for T2DM, HTN, HLD, pancolonic diverticulosis with recurrent diverticular bleeding who is admitted with lower GI bleeding.  Assessment and Plan: Bright light blood per rectum Pancolonic diverticulosis with likely recurrent diverticular bleeding: Presenting with likely recurrent diverticular bleeding.  2 recent admits for the same.  Colonoscopy 9/4 showed pancolonic diverticulosis without evidence of active bleeding or mass/lesion.  Initial hemoglobin 11.7, decreased to 10.2 on repeat.  Vitals have been stable. -Repeat H&H tonight, CBC in a.m. -Type and screen, patient agreeable for blood  transfusion if necessary -If having significant decline in hemoglobin may need to involve [facility] again  Microcytic anemia: History of iron deficiency and likely some component of chronic GI blood loss.  Check ferritin and iron/TIBC.  Type II diabetes: Placed on SSI.  Hypertension: Resume Coreg tomorrow.  Hold olmesartan for now.  Hyperlipidemia: Continue atorvastatin.  Hypokalemia: Supplementing.   DVT prophylaxis: SCDs Start: 11/07/23 2022 Code Status: Full code, confirmed with patient on admission Family Communication: Discussed with patient, she has discussed with family Disposition Plan: From home, likely discharge to home pending clinical progress Consults called: EDP discussed with [facility] Severity of Illness: The appropriate patient status for this patient is OBSERVATION. Observation status is judged to be reasonable and necessary in order to provide the  required intensity of service to ensure the patient's safety. The patient's presenting symptoms, physical exam findings, and initial radiographic and laboratory data in the context of their medical condition is felt to place them at decreased risk for further clinical deterioration. Furthermore, it is anticipated that the patient will be medically stable for discharge from the hospital within 2 midnights of admission.   Darreld Mclean MD [facility]  If 7PM-7AM, please contact night-coverage [email or web address]  11/07/2023, 8:52 PM

## 2023-11-07 NOTE — ED Provider Notes (Signed)
Signout from [place] PA-C at shift change. Briefly, patient presents for bright red blood per rectum.  Patient has a history of "stuttering" diverticular bleeding.  Her bleeding resumed this morning.  She reports some generalized abdominal discomfort and bloating yesterday.  She did pass some clots per rectum as well.  She is not on anticoagulation.   Plan: Awaiting CT results.   4:36 PM Reassessment performed. Patient appears comfortable, stable.  Labs and imaging personally reviewed and interpreted including: CT angio of the abdomen, does not show signs of diverticulitis or other infection, obvious force of bleeding.  CBC with hemoglobin 11.7 which is much improved in previous hospitalization; CMP normal; lipase normal.   Most current vital signs reviewed and are as follows: BP 133/65   Pulse 75   Temp 99 F (37.2 C)   Resp 17   Ht 5\' 3"  (1.6 m)   Wt 99.3 kg   SpO2 98%   BMI 38.79 kg/m   Plan: I did discuss case with on-call [facility] PA Steffanie Dunn.  Overall workup is reassuring.  They would not have any concerns about the patient going home, however understandable given the patient's history of significant bleeding and would also support admission for monitoring, rechecking hemoglobin in the morning.   I discussed this with patient.  We are going to wait a little longer and recheck a CBC and see where the hemoglobin is at.  Will plan CBC recheck at 6 PM.  If downtrending, would be reasonable to admit overnight for observation.  Otherwise would give further reassurance that bleeding is not uncontrolled.  6:44 PM Reassessment performed. Patient appears stable, comfortable.  She has not had another bowel movement.  Labs personally reviewed and interpreted including: Repeat CBC shows decrease in hemoglobin of 1-1/2 points to 10.2.  Given this drop, we will plan for observation admission for hemoglobin recheck in the morning.  Most current vital signs reviewed and are as follows: BP 133/65    Pulse 75   Temp 99 F (37.2 C)   Resp 17   Ht 5\' 3"  (1.6 m)   Wt 99.3 kg   SpO2 98%   BMI 38.79 kg/m   Plan: Admission to hospital for lower GI bleeding  7:03 PM Spoke with Dr. Allena Katz of [facility] who will see patient.          Renne Crigler, PA-C 11/07/23 1903    Rondel Baton, MD 11/09/23 [phone]

## 2023-11-07 NOTE — Hospital Course (Signed)
Carol Mueller is a 62 y.o. female with medical history significant for T2DM, HTN, HLD, pancolonic diverticulosis with recurrent diverticular bleeding who is admitted with lower GI bleeding.

## 2023-11-07 NOTE — ED Provider Triage Note (Signed)
Emergency Medicine Provider Triage Evaluation Note  Carol Mueller , a 62 y.o. female  was evaluated in triage.  Pt complains of abd pain. L side abd pain x 2-3 days, now having BRBPR (3 episodes today)  report feeling weak.  No fever, n/v.  Hx of diverticulosis, hx of anemia needing blood transfusion  Review of Systems  Positive: As above Negative: As above  Physical Exam  BP 133/85 (BP Location: Right Arm)   Pulse 86   Temp 99.3 F (37.4 C) (Oral)   Resp 16   Ht 5\' 3"  (1.6 m)   Wt 99.3 kg   SpO2 97%   BMI 38.79 kg/m  Gen:   Awake, no distress   Resp:  Normal effort  MSK:   Moves extremities without difficulty  Other:    Medical Decision Making  Medically screening exam initiated at 11:21 AM.  Appropriate orders placed.  Evelina Dun was informed that the remainder of the evaluation will be completed by another provider, this initial triage assessment does not replace that evaluation, and the importance of remaining in the ED until their evaluation is complete.     Fayrene Helper, PA-C 11/07/23 1126

## 2023-11-07 NOTE — ED Triage Notes (Signed)
Pt complains of LLQ pain x 3 days. This morning went to the bathroom and had bright red blood in stool. Pt has had 2 other bowel movements today with blood in stool. Pt has hx GI bleeds. Started to feel dizzy this morning.

## 2023-11-07 NOTE — ED Notes (Signed)
ED TO INPATIENT HANDOFF REPORT  ED Nurse Name and Phone #:  Marcello Moores [phone]  S Name/Age/Gender Evelina Dun 62 y.o. female Room/Bed: 034C/034C  Code Status   Code Status: Prior  Home/SNF/Other Home Patient oriented to: self, place, time, and situation Is this baseline? Yes   Triage Complete: Triage complete  Chief Complaint Acute lower GI bleeding [K92.2]  Triage Note Pt complains of LLQ pain x 3 days. This morning went to the bathroom and had bright red blood in stool. Pt has had 2 other bowel movements today with blood in stool. Pt has hx GI bleeds. Started to feel dizzy this morning.    Allergies Allergies  Allergen Reactions   Norvasc [Amlodipine Besylate] Swelling    ANGIOEDEMA   Nsaids Other (See Comments)    GI upset/ GI bleed   Sulfa Antibiotics Other (See Comments)    Unknown reaction as a child    Level of Care/Admitting Diagnosis ED Disposition     ED Disposition  Admit   Condition  --   Comment  Hospital Area: [facility] [[id number]]  Level of Care: Med-Surg [16]  May place patient in observation at [facility] or [facility] if equivalent level of care is available:: No  Covid Evaluation: Asymptomatic - no recent exposure (last 10 days) testing not required  Diagnosis: Acute lower GI bleeding [295284]  Admitting Physician: Charlsie Quest [[id number]]  Attending Physician: Charlsie Quest [[id number]]          B Medical/Surgery History 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   Diabetes mellitus without complication (HCC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   Diverticulitis    Diverticulosis    GI bleed    Hyperlipidemia    Hypertension    Polycystic ovary disease    Sepsis (HCC) 05/2019   Past Surgical History:  Procedure Laterality Date   ADRENALECTOMY  1996   BREAST BIOPSY Right 2018   BREAST BIOPSY Left    x2   CHOLECYSTECTOMY     COLONOSCOPY WITH PROPOFOL N/A  10/03/2019   Procedure: COLONOSCOPY WITH PROPOFOL;  Surgeon: Meryl Dare, MD;  Location: [facility]COPY;  Service: Endoscopy;  Laterality: N/A;   COLONOSCOPY WITH PROPOFOL N/A 11/22/2020   Procedure: COLONOSCOPY WITH PROPOFOL;  Surgeon: Meryl Dare, MD;  Location: [facility];  Service: Endoscopy;  Laterality: N/A;   COLONOSCOPY WITH PROPOFOL N/A 07/21/2023   Procedure: COLONOSCOPY WITH PROPOFOL;  Surgeon: Benancio Deeds, MD;  Location: [facility] ENDOSCOPY;  Service: Gastroenterology;  Laterality: N/A;   ESOPHAGOGASTRODUODENOSCOPY (EGD) WITH PROPOFOL N/A 07/16/2015   Procedure: ESOPHAGOGASTRODUODENOSCOPY (EGD) WITH PROPOFOL;  Surgeon: Rachael Fee, MD;  Location: [facility];  Service: Endoscopy;  Laterality: N/A;   ESOPHAGOGASTRODUODENOSCOPY (EGD) WITH PROPOFOL N/A 11/28/2020   Procedure: ESOPHAGOGASTRODUODENOSCOPY (EGD) WITH PROPOFOL;  Surgeon: Shellia Cleverly, DO;  Location: [facility];  Service: Gastroenterology;  Laterality: N/A;   GIVENS CAPSULE STUDY N/A 11/28/2020   Procedure: GIVENS CAPSULE STUDY;  Surgeon: Shellia Cleverly, DO;  Location: [facility];  Service: Gastroenterology;  Laterality: N/A;   POLYPECTOMY  10/03/2019   Procedure: POLYPECTOMY;  Surgeon: Meryl Dare, MD;  Location: [facility];  Service: Endoscopy;;   POLYPECTOMY  11/22/2020   Procedure: POLYPECTOMY;  Surgeon: Meryl Dare, MD;  Location: [facility];  Service: Endoscopy;;     A IV Location/Drains/Wounds Patient Lines/Drains/Airways Status     Active Line/Drains/Airways  Name Placement date Placement time Site Days   Peripheral IV 11/07/23 20 G Left Antecubital 11/07/23  1157  Antecubital  less than 1            Intake/Output Last 24 hours No intake or output data in the 24 hours ending 11/07/23 1917  Labs/Imaging Results for orders placed or performed during the hospital encounter of 11/07/23 (from the past 48 hours)  CBC with Differential     Status: Abnormal   Collection  Time: 11/07/23 11:24 AM  Result Value Ref Range   WBC 10.3 4.0 - 10.5 K/uL   RBC 5.65 (H) 3.87 - 5.11 MIL/uL   Hemoglobin 11.7 (L) 12.0 - 15.0 g/dL   HCT 16.1 09.6 - 04.5 %   MCV 68.1 (L) 80.0 - 100.0 fL   MCH 20.7 (L) 26.0 - 34.0 pg   MCHC 30.4 30.0 - 36.0 g/dL   RDW 40.9 (H) 81.1 - 91.4 %   Platelets 364 150 - 400 K/uL    Comment: REPEATED TO VERIFY   nRBC 0.0 0.0 - 0.2 %   Neutrophils Relative % 64 %   Neutro Abs 6.6 1.7 - 7.7 K/uL   Lymphocytes Relative 24 %   Lymphs Abs 2.5 0.7 - 4.0 K/uL   Monocytes Relative 7 %   Monocytes Absolute 0.7 0.1 - 1.0 K/uL   Eosinophils Relative 5 %   Eosinophils Absolute 0.5 0.0 - 0.5 K/uL   Basophils Relative 0 %   Basophils Absolute 0.0 0.0 - 0.1 K/uL   nRBC 0 0 /100 WBC   Abs Immature Granulocytes 0.00 0.00 - 0.07 K/uL    Comment: Performed at [facility], [address], [place], [place] [address]  Comprehensive metabolic panel     Status: Abnormal   Collection Time: 11/07/23 11:24 AM  Result Value Ref Range   Sodium 137 135 - 145 mmol/L   Potassium 3.4 (L) 3.5 - 5.1 mmol/L   Chloride 103 98 - 111 mmol/L   CO2 25 22 - 32 mmol/L   Glucose, Bld 100 (H) 70 - 99 mg/dL    Comment: Glucose reference range applies only to samples taken after fasting for at least 8 hours.   BUN 8 8 - 23 mg/dL   Creatinine, Ser 6.21 0.44 - 1.00 mg/dL   Calcium 9.5 8.9 - 30.8 mg/dL   Total Protein 7.6 6.5 - 8.1 g/dL   Albumin 3.6 3.5 - 5.0 g/dL   AST 18 15 - 41 U/L   ALT 13 0 - 44 U/L   Alkaline Phosphatase 56 38 - 126 U/L   Total Bilirubin 0.5 <1.2 mg/dL   GFR, Estimated >65 >78 mL/min    Comment: (NOTE) Calculated using the CKD-EPI Creatinine Equation (2021)    Anion gap 9 5 - 15    Comment: Performed at [facility], [address] [place], [place] [address]  Lipase, blood     Status: None   Collection Time: 11/07/23 11:24 AM  Result Value Ref Range   Lipase 32 11 - 51 U/L    Comment: Performed at [facility], [address], [place], [place] [address]  Urinalysis, Routine w reflex microscopic -Urine, Clean Catch     Status: Abnormal   Collection Time: 11/07/23 11:24 AM  Result Value Ref Range   Color, Urine AMBER (A) YELLOW    Comment: BIOCHEMICALS MAY BE AFFECTED BY COLOR   APPearance HAZY (A) CLEAR   Specific Gravity,  Urine 1.017 1.005 - 1.030   pH 5.0 5.0 - 8.0   Glucose, UA 150 (A) NEGATIVE mg/dL   Hgb urine dipstick NEGATIVE NEGATIVE   Bilirubin Urine NEGATIVE NEGATIVE   Ketones, ur NEGATIVE NEGATIVE mg/dL   Protein, ur NEGATIVE NEGATIVE mg/dL   Nitrite NEGATIVE NEGATIVE   Leukocytes,Ua NEGATIVE NEGATIVE    Comment: Performed at [facility], [address] [place], [place] [address]  Type and screen [facility]     Status: None   Collection Time: 11/07/23 11:56 AM  Result Value Ref Range   ABO/RH(D) O POS    Antibody Screen NEG    Sample Expiration      11/10/2023,2359 Performed at [facility], [address] [place], [place] [address]   CBC     Status: Abnormal   Collection Time: 11/07/23  6:01 PM  Result Value Ref Range   WBC 10.6 (H) 4.0 - 10.5 K/uL   RBC 5.01 3.87 - 5.11 MIL/uL   Hemoglobin 10.2 (L) 12.0 - 15.0 g/dL   HCT 10.2 (L) 72.5 - 36.6 %   MCV 67.3 (L) 80.0 - 100.0 fL    Comment: REPEATED TO VERIFY   MCH 20.4 (L) 26.0 - 34.0 pg   MCHC 30.3 30.0 - 36.0 g/dL   RDW 44.0 (H) 34.7 - 42.5 %   Platelets 334 150 - 400 K/uL    Comment: REPEATED TO VERIFY   nRBC 0.0 0.0 - 0.2 %    Comment: Performed at [facility], [address] [place], [place] [address]   CT ANGIO GI BLEED Result Date: 11/07/2023 CLINICAL DATA:  Mesenteric ischemia, bloody stool left lower quadrant pain for 3 days EXAM: CTA ABDOMEN AND PELVIS WITHOUT AND WITH CONTRAST TECHNIQUE: Multidetector CT imaging of the abdomen and pelvis was performed using the standard protocol during bolus administration of intravenous contrast. Multiplanar reconstructed images and MIPs were obtained  and reviewed to evaluate the vascular anatomy. RADIATION DOSE REDUCTION: This exam was performed according to the departmental dose-optimization program which includes automated exposure control, adjustment of the mA and/or kV according to patient size and/or use of iterative reconstruction technique. CONTRAST:  75mL OMNIPAQUE IOHEXOL 350 MG/ML SOLN COMPARISON:  07/20/2023 FINDINGS: VASCULAR Normal contour and caliber of the abdominal aorta. No evidence of aneurysm, dissection, or other acute aortic pathology. 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 The mesenteric arteries and their proximal branch vessels are patent. Review of the MIP images confirms the above findings. NON-VASCULAR Lower Chest: No acute findings. Hepatobiliary: No focal liver abnormality is seen. Status post cholecystectomy. No biliary dilatation. Pancreas: Unremarkable. No pancreatic ductal dilatation or surrounding inflammatory changes. Spleen: Normal in size without significant abnormality. Adrenals/Urinary Tract: Status post right adrenalectomy. Left adrenal is normal. Multiple simple, benign bilateral renal cortical cysts, for which no further follow-up or characterization is required. Kidneys are otherwise normal, without renal calculi, solid lesion, or hydronephrosis. Bladder is unremarkable. Stomach/Bowel: Stomach is within normal limits. Appendix appears normal. No evidence of bowel wall thickening, distention, or inflammatory changes. Pancolonic diverticulosis without evidence of acute diverticulitis. Multiple chronically inspissated hyperdense diverticula and multiple areas of chronic appearing wall thickening, including of the mid ascending colon and sigmoid, similar to prior examination (series 16, image 101, 165). No intraluminal contrast extravasation to localize reported GI bleeding. Lymphatic: No enlarged abdominal or pelvic lymph nodes.  Reproductive: Bulky calcified uterine fibroids.  Other: No abdominal wall hernia or abnormality. No ascites. Musculoskeletal: No acute osseous findings. IMPRESSION: 1. The mesenteric arteries and their proximal branch vessels are patent. No evidence of mesenteric ischemia. 2. Pancolonic diverticulosis without evidence of acute diverticulitis. Multiple chronically inspissated hyperdense diverticula and multiple areas of chronic appearing wall thickening, including of the mid ascending colon and sigmoid, similar to prior examination and suggesting chronic sequelae of diverticulitis. 3. No intraluminal contrast extravasation to localize reported GI bleeding. Aortic Atherosclerosis (ICD10-I70.0). Electronically Signed   By: Jearld Lesch M.D.   On: 11/07/2023 15:26    Pending Labs Unresulted Labs (From admission, onward)    None       Vitals/Pain Today's Vitals   11/07/23 1118 11/07/23 1424 11/07/23 1718 11/07/23 1845  BP:  133/65  (!) 154/77  Pulse:  75  72  Resp:  17  17  Temp:  99 F (37.2 C)  98.8 F (37.1 C)  TempSrc:    Oral  SpO2:  98%  98%  Weight: 99.3 kg     Height: 5\' 3"  (1.6 m)     PainSc: 7   3      Isolation Precautions No active isolations  Medications Medications  morphine (PF) 4 MG/ML injection 4 mg (4 mg Intravenous Given 11/07/23 1429)  ondansetron (ZOFRAN) injection 4 mg (4 mg Intravenous Given 11/07/23 1438)  iohexol (OMNIPAQUE) 350 MG/ML injection 75 mL (75 mLs Intravenous Contrast Given 11/07/23 1501)    Mobility walks     Focused Assessments     R Recommendations: See Admitting Provider Note  Report given to:   Additional Notes:

## 2023-11-07 NOTE — ED Provider Notes (Signed)
[place] EMERGENCY DEPARTMENT AT [facility] Provider Note   CSN: [id number] Arrival date & time: 11/07/23  1100     History Chief Complaint  Patient presents with   Rectal Bleeding    Carol Mueller is a 62 y.o. female with history of lower GI bleeds, hypertension, diabetes, hyperlipidemia, ischemic colitis presents emerged from today for evaluation of abdominal pain with some hematochezia.  Patient reports that yesterday she started having some left upper quadrant abdominal pain/soon spread to diffuse abdominal pain.  She reports that it felt cramping in sharp in nature.  She reports 3 bloody bowel movements in the toilet.  First one was accompanied by some soft [place] stool.  She reports that the other 2 have been more bloody mucus.  She denies any nausea, vomiting, fever, dysuria, hematuria, or any melena.  She reports that yesterday she ate some greens, rice, and cooked fish.  Last colonoscopy was September 2024.  She reports that her last GI bleed was in September 22 for as well.  She reports that she has eats at least once a year.  She sees [facility] outpatient.  She is allergic to NSAIDs and sulfa.   Rectal Bleeding Associated symptoms: abdominal pain   Associated symptoms: no fever and no vomiting        Home Medications Prior to Admission medications   Medication Sig Start Date End Date Taking? Authorizing Provider  acetaminophen (TYLENOL) 500 MG tablet Take 1,000 mg by mouth every 6 (six) hours as needed for mild pain.    [provider]  atorvastatin (LIPITOR) 10 MG tablet Take 10 mg by mouth daily. 03/06/19   [provider]  BD PEN NEEDLE NANO 2ND GEN 32G X 4 MM MISC Inject 1 Needle as directed daily. 07/15/23   [provider]  carvedilol (COREG) 25 MG tablet Take 25 mg by mouth 2 (two) times daily. 10/30/20   [provider]  Cholecalciferol (VITAMIN D) 50 MCG (2000 UT) CAPS Take 2,000 Units by mouth daily.    [provider]  dicyclomine (BENTYL) 20 MG tablet Take 1 tablet (20 mg total) by mouth every 8 (eight) hours as needed for spasms. 08/11/23   Meredith Pel, NP  empagliflozin (JARDIANCE) 10 MG TABS tablet Take 10 mg by mouth daily.    [provider]  ferrous sulfate 325 (65 FE) MG EC tablet Take 1 tablet (325 mg total) by mouth every other day. 07/22/23 07/21/24  Wouk, Wilfred Curtis, MD  insulin aspart protamine - aspart (NOVOLOG MIX 70/30 FLEXPEN) (70-30) 100 UNIT/ML FlexPen Inject 0-30 Units into the skin 2 (two) times daily. Dose per sliding scale.    [provider]  metFORMIN (GLUCOPHAGE) 500 MG tablet Take 500 mg by mouth 2 (two) times daily with a meal.    [provider]  methocarbamol (ROBAXIN) 500 MG tablet Take 500 mg by mouth every 6 (six) hours as needed for muscle spasms.    [provider]  Multiple Vitamins-Minerals (MULTIVITAMIN WITH MINERALS) tablet Take 1 tablet by mouth daily.    [provider]  olmesartan (BENICAR) 40 MG tablet Take 40 mg by mouth daily. 10/11/19   [provider]  pantoprazole (PROTONIX) 20 MG tablet Take 1 tablet (20 mg total) by mouth daily for 14 days. 07/19/23 08/02/23  Sloan Leiter, DO  Semaglutide,0.25 or 0.5MG /DOS, 2 MG/3ML SOPN Inject 0.5 mg into the skin every Sunday.    [provider]  Allergies    Norvasc [amlodipine besylate], Nsaids, and Sulfa antibiotics    Review of Systems   Review of Systems  Constitutional:  Negative for chills and fever.  Respiratory:  Negative for shortness of breath.   Cardiovascular:  Negative for chest pain.  Gastrointestinal:  Positive for abdominal pain, blood in stool and hematochezia. Negative for constipation, diarrhea, nausea, rectal pain and vomiting.  Genitourinary:  Negative for dysuria and hematuria.    Physical Exam Updated Vital Signs BP 133/85 (BP Location: Right Arm)   Pulse 86   Temp 99.3 F (37.4 C) (Oral)   Resp 16   Ht 5'  3" (1.6 m)   Wt 99.3 kg   SpO2 97%   BMI 38.79 kg/m  Physical Exam Vitals and nursing note reviewed.  Constitutional:      General: She is not in acute distress.    Appearance: She is not toxic-appearing.  Eyes:     General: No scleral icterus. Pulmonary:     Effort: Pulmonary effort is normal. No respiratory distress.  Abdominal:     Palpations: Abdomen is soft.     Tenderness: There is abdominal tenderness.     Comments: Left upper and left lower quadrant tenderness palpation.  More tenderness to the left lower quadrant.  Soft.  No guarding or rebound.  Skin:    General: Skin is warm and dry.  Neurological:     Mental Status: She is alert.     ED Results / Procedures / Treatments   Labs (all labs ordered are listed, but only abnormal results are displayed) Labs Reviewed  URINALYSIS, ROUTINE W REFLEX MICROSCOPIC - Abnormal; Notable for the following components:      Result Value   Color, Urine AMBER (*)    APPearance HAZY (*)    Glucose, UA 150 (*)    All other components within normal limits  CBC WITH DIFFERENTIAL/PLATELET  COMPREHENSIVE METABOLIC PANEL  LIPASE, BLOOD  TYPE AND SCREEN    EKG None  Radiology No results found.  Procedures Procedures   Medications Ordered in ED Medications - No data to display  ED Course/ Medical Decision Making/ A&P    Medical Decision Making Amount and/or Complexity of Data Reviewed Radiology: ordered.  Risk Prescription drug management.   62 y.o. female presents to the ER for evaluation of left-sided abdominal pain with hematochezia. Differential diagnosis includes but is not limited to AAA, mesenteric ischemia, appendicitis, diverticulitis, DKA, gastroenteritis, nephrolithiasis, pancreatitis, constipation, UTI, bowel obstruction, biliary disease, IBD, PUD, hepatitis. Vital signs unremarkable. Physical exam as noted above.   On previous chart evaluation, patient has been admitted before with most recent time being  in September 2024 for hematochezia.  She reports that she had a colonoscopy at that time that showed diverticulosis.  She reports that she usually has a flare hematochezia and diverticulitis every year.  Has been suffering with this since around 2015.  She sees [facility] outpatient.  I independently reviewed and interpreted the patient's labs.  Lipase within normal limits.  CMP shows mildly decreased potassium 3 and 4.  Glucose at 100.  Otherwise, no electrolyte or LFT abnormality.  Urinalysis shows amber, hazy urine with 50 glucose present however no other signs of infection.  CBC does not show any leukocytosis.  She does have some mild anemia however it appears to be significantly better than it has been previously.  Patient reports she is currently on iron supplementation.  CT scan pending.  3:28 PM Care of Jakelin  Alveda Reasons  transferred to [employer] at the end of my shift as the patient will require reassessment once labs/imaging have resulted. Patient presentation, ED course, and plan of care discussed with review of all pertinent labs and imaging. Please see his/her note for further details regarding further ED course and disposition. Plan at time of handoff is follow international up with CT scan. Discharge and plan to be determined on results. This may be altered or completely changed at the discretion of the oncoming team pending results of further workup.  Portions of this report may have been transcribed using voice recognition software. Every effort was made to ensure accuracy; however, inadvertent computerized transcription errors may be present.   Final Clinical Impression(s) / ED Diagnoses Final diagnoses:  None    Rx / DC Orders ED Discharge Orders     None         Achille Rich, Cordelia Poche 11/07/23 1535    Gloris Manchester, MD 11/07/23 1600

## 2023-11-08 DIAGNOSIS — D509 Iron deficiency anemia, unspecified: Secondary | ICD-10-CM

## 2023-11-08 DIAGNOSIS — E785 Hyperlipidemia, unspecified: Secondary | ICD-10-CM

## 2023-11-08 DIAGNOSIS — I152 Hypertension secondary to endocrine disorders: Secondary | ICD-10-CM

## 2023-11-08 DIAGNOSIS — E876 Hypokalemia: Secondary | ICD-10-CM | POA: Diagnosis not present

## 2023-11-08 DIAGNOSIS — K5730 Diverticulosis of large intestine without perforation or abscess without bleeding: Secondary | ICD-10-CM

## 2023-11-08 DIAGNOSIS — E1169 Type 2 diabetes mellitus with other specified complication: Secondary | ICD-10-CM

## 2023-11-08 DIAGNOSIS — E1159 Type 2 diabetes mellitus with other circulatory complications: Secondary | ICD-10-CM

## 2023-11-08 LAB — HEMOGLOBIN AND HEMATOCRIT, BLOOD
HCT: 33.1 % — ABNORMAL LOW (ref 36.0–46.0)
Hemoglobin: 9.9 g/dL — ABNORMAL LOW (ref 12.0–15.0)

## 2023-11-08 LAB — BASIC METABOLIC PANEL
Anion gap: 10 (ref 5–15)
BUN: 5 mg/dL — ABNORMAL LOW (ref 8–23)
CO2: 24 mmol/L (ref 22–32)
Calcium: 9 mg/dL (ref 8.9–10.3)
Chloride: 103 mmol/L (ref 98–111)
Creatinine, Ser: 0.96 mg/dL (ref 0.44–1.00)
GFR, Estimated: >60 mL/min (ref >60)
Glucose, Bld: 79 mg/dL (ref 70–99)
Potassium: 3.5 mmol/L (ref 3.5–5.1)
Sodium: 137 mmol/L (ref 135–145)

## 2023-11-08 LAB — CBC
HCT: 32.9 % — ABNORMAL LOW (ref 36.0–46.0)
Hemoglobin: 10.1 g/dL — ABNORMAL LOW (ref 12.0–15.0)
MCH: 20.6 pg — ABNORMAL LOW (ref 26.0–34.0)
MCHC: 30.7 g/dL (ref 30.0–36.0)
MCV: 67 fL — ABNORMAL LOW (ref 80.0–100.0)
Platelets: 334 10*3/uL (ref 150–400)
RBC: 4.91 MIL/uL (ref 3.87–5.11)
RDW: 18.6 % — ABNORMAL HIGH (ref 11.5–15.5)
WBC: 8.7 10*3/uL (ref 4.0–10.5)
nRBC: 0 % (ref 0.0–0.2)

## 2023-11-08 LAB — GLUCOSE, CAPILLARY
Glucose-Capillary: 85 mg/dL (ref 70–99)
Glucose-Capillary: 92 mg/dL (ref 70–99)
Glucose-Capillary: 107 mg/dL — ABNORMAL HIGH (ref 70–99)

## 2023-11-08 NOTE — Plan of Care (Signed)

## 2023-11-08 NOTE — Plan of Care (Signed)
  Problem: Education: Goal: Knowledge of General Education information will improve Description: Including pain rating scale, medication(s)/side effects and non-pharmacologic comfort measures Outcome: Progressing   Problem: Clinical Measurements: Goal: Will remain free from infection Outcome: Progressing Goal: Diagnostic test results will improve Outcome: Progressing Goal: Respiratory complications will improve Outcome: Progressing   Problem: Activity: Goal: Risk for activity intolerance will decrease Outcome: Progressing   Problem: Nutrition: Goal: Adequate nutrition will be maintained Outcome: Progressing   Problem: Coping: Goal: Level of anxiety will decrease Outcome: Progressing   Problem: Elimination: Goal: Will not experience complications related to bowel motility Outcome: Progressing Goal: Will not experience complications related to urinary retention Outcome: Progressing   Problem: Pain Management: Goal: General experience of comfort will improve Outcome: Progressing   Problem: Safety: Goal: Ability to remain free from injury will improve Outcome: Progressing   Problem: Skin Integrity: Goal: Risk for impaired skin integrity will decrease Outcome: Progressing

## 2023-11-08 NOTE — Progress Notes (Signed)
Patient admitted to room 6N16. Patient is alert and oriented x 4. Patient is ambulatory. Patient skin assess and is intact no skin issues noted. Patient oriented to unit, room and staff.

## 2023-11-08 NOTE — Progress Notes (Signed)
[facility]                                                                               Carol Mueller, is a 62 y.o. female, DOB - 1961/02/27, ZOX:[id number] Admit date - 11/07/2023    Outpatient Primary MD for the patient is [name], DO  LOS - 0  days    Brief summary   Carol Mueller is a 62 y.o. female with medical history significant for T2DM, HTN, HLD, pancolonic diverticulosis with recurrent diverticular bleeding who is admitted with lower GI bleeding.    Patient with recent history of lower GI bleeding, suspected due to stuttering diverticular bleed:   Admitted 07/07/23-07/15/23 for likely diverticular bleed.  Required 3 unit PRBC transfusion.  CT angiogram negative for active bleeding.  Hemoglobin stabilized at 8.2 on discharge.   Admitted 07/19/23-07/22/23 again for lower GI bleeding.  Required 1 unit PRBC transfusion.  CT angiogram again negative for source of bleeding.  Underwent colonoscopy 9/4 which showed pancolonic diverticulosis without evidence of active bleeding.  GI felt this was likely stuttering diverticular bleeding.  Hemoglobin 8.1 on discharge.  CT angio GI bleed study negative for evidence of mesenteric ischemia. No intraluminal contrast extravasation to localize reported GI bleeding. Pancolonic diverticulosis without evidence of acute diverticulitis noted    EDP briefly discussed case with [facility] who felt patient can either be discharged home or monitored overnight.  Repeat CBC was obtained and showed hemoglobin decreased to 10.2.    Assessment & Plan    Assessment and Plan:  Rectal bleeding secondary to pancolonic diverticulosis Colonoscopy on 9/4 showed pancolonic diverticulosis without evidence of active bleeding or mass or lesion. Continue to monitor H&H and transfuse to keep hemoglobin greater than 7 If significant decline in hemoglobin will request GI consult CT angio on admission does not show any evidence of mesenteric  ischemia Anemia panel shows ferritin of 5, with normal iron levels Hemoglobin stable around 10. Monitor.   Anemia of blood loss Transfuse to keep it greater than 7.  Hemoglobin around 10 today.    Essential hypertension Blood pressure parameters are optimal   Hyperlipidemia Continue with atorvastatin   Hypokalemia Replaced    Type 2 diabetes mellitus continue with sliding scale insulin for now   Estimated body mass index is 38.79 kg/m as calculated from the following:   Height as of this encounter: 5\' 3"  (1.6 m).   Weight as of this encounter: 99.3 kg.  Code Status: full code.  DVT Prophylaxis:  SCDs Start: 11/07/23 2022   Level of Care: Level of care: Med-Surg Family Communication: none at bedside.   Disposition Plan:     Remains inpatient appropriate:  gi bleed  Procedures:  None.   Consultants:   None.   Antimicrobials:   Anti-infectives (From admission, onward)    None        Medications  Scheduled Meds:  atorvastatin  10 mg Oral Daily   carvedilol  25 mg Oral BID   insulin aspart  0-9 Units Subcutaneous TID WC   sodium chloride flush  3 mL Intravenous Q12H   Continuous Infusions: PRN Meds:.acetaminophen **OR** acetaminophen, dicyclomine, ondansetron **OR**  ondansetron (ZOFRAN) IV    Subjective:   Carol Mueller was seen and examined today.  Mild abdominal pain in the lower quadrant. No bleeding since yesterday afternoon.   Objective:   Vitals:   11/07/23 2058 11/07/23 2330 11/08/23 0357 11/08/23 0731  BP: 121/63 133/62 131/73 128/77  Pulse: 70 77 76 68  Resp:    16  Temp: 98.7 F (37.1 C) 98.3 F (36.8 C) 98.3 F (36.8 C) 98.6 F (37 C)  TempSrc: Oral Oral Oral Oral  SpO2: 94% 96% 99% 100%  Weight:      Height:        Intake/Output Summary (Last 24 hours) at 11/08/2023 1439 Last data filed at 11/08/2023 0400 Gross per 24 hour  Intake 483 ml  Output --  Net 483 ml   Filed Weights   11/07/23 1118  Weight: 99.3 kg      Exam General: Alert and oriented x 3, NAD Cardiovascular: S1 S2 auscultated, no murmurs, RRR Respiratory: Clear to auscultation bilaterally, no wheezing, rales or rhonchi Gastrointestinal: Soft, nontender, nondistended, + bowel sounds Ext: no pedal edema bilaterally Neuro: AAOx3, Cr N's II- XII. Strength 5/5 upper and lower extremities bilaterally Skin: No rashes Psych: Normal affect and demeanor, alert and oriented x3    Data Reviewed:  I have personally reviewed following labs and imaging studies   CBC Lab Results  Component Value Date   WBC 8.7 11/08/2023   RBC 4.91 11/08/2023   HGB 10.1 (L) 11/08/2023   HCT 32.9 (L) 11/08/2023   MCV 67.0 (L) 11/08/2023   MCH 20.6 (L) 11/08/2023   PLT 334 11/08/2023   MCHC 30.7 11/08/2023   RDW 18.6 (H) 11/08/2023   LYMPHSABS 2.5 11/07/2023   MONOABS 0.7 11/07/2023   EOSABS 0.5 11/07/2023   BASOSABS 0.0 11/07/2023     Last metabolic panel Lab Results  Component Value Date   NA 137 11/08/2023   K 3.5 11/08/2023   CL 103 11/08/2023   CO2 24 11/08/2023   BUN 5 (L) 11/08/2023   CREATININE 0.96 11/08/2023   GLUCOSE 79 11/08/2023   GFRNONAA >60 11/08/2023   GFRAA >60 03/01/2020   CALCIUM 9.0 11/08/2023   PHOS 2.7 07/11/2023   PROT 7.6 11/07/2023   ALBUMIN 3.6 11/07/2023   BILITOT 0.5 11/07/2023   ALKPHOS 56 11/07/2023   AST 18 11/07/2023   ALT 13 11/07/2023   ANIONGAP 10 11/08/2023    CBG (last 3)  Recent Labs    11/07/23 2057 11/08/23 0830 11/08/23 1154  GLUCAP 78 85 92      Coagulation Profile: No results for input(s): "INR", "PROTIME" in the last 168 hours.   Radiology Studies: CT ANGIO GI BLEED Result Date: 11/07/2023 CLINICAL DATA:  Mesenteric ischemia, bloody stool left lower quadrant pain for 3 days EXAM: CTA ABDOMEN AND PELVIS WITHOUT AND WITH CONTRAST TECHNIQUE: Multidetector CT imaging of the abdomen and pelvis was performed using the standard protocol during bolus administration of intravenous  contrast. Multiplanar reconstructed images and MIPs were obtained and reviewed to evaluate the vascular anatomy. RADIATION DOSE REDUCTION: This exam was performed according to the departmental dose-optimization program which includes automated exposure control, adjustment of the mA and/or kV according to patient size and/or use of iterative reconstruction technique. CONTRAST:  75mL OMNIPAQUE IOHEXOL 350 MG/ML SOLN COMPARISON:  07/20/2023 FINDINGS: VASCULAR Normal contour and caliber of the abdominal aorta. No evidence of aneurysm, dissection, or other acute aortic pathology. 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 The mesenteric arteries and their proximal branch vessels are patent. Review of the MIP images confirms the above findings. NON-VASCULAR Lower Chest: No acute findings. Hepatobiliary: No focal liver abnormality is seen. Status post cholecystectomy. No biliary dilatation. Pancreas: Unremarkable. No pancreatic ductal dilatation or surrounding inflammatory changes. Spleen: Normal in size without significant abnormality. Adrenals/Urinary Tract: Status post right adrenalectomy. Left adrenal is normal. Multiple simple, benign bilateral renal cortical cysts, for which no further follow-up or characterization is required. Kidneys are otherwise normal, without renal calculi, solid lesion, or hydronephrosis. Bladder is unremarkable. Stomach/Bowel: Stomach is within normal limits. Appendix appears normal. No evidence of bowel wall thickening, distention, or inflammatory changes. Pancolonic diverticulosis without evidence of acute diverticulitis. Multiple chronically inspissated hyperdense diverticula and multiple areas of chronic appearing wall thickening, including of the mid ascending colon and sigmoid, similar to prior examination (series 16, image 101, 165). No intraluminal contrast extravasation to localize reported GI  bleeding. Lymphatic: No enlarged abdominal or pelvic lymph nodes. Reproductive: Bulky calcified uterine fibroids. Other: No abdominal wall hernia or abnormality. No ascites. Musculoskeletal: No acute osseous findings. IMPRESSION: 1. The mesenteric arteries and their proximal branch vessels are patent. No evidence of mesenteric ischemia. 2. Pancolonic diverticulosis without evidence of acute diverticulitis. Multiple chronically inspissated hyperdense diverticula and multiple areas of chronic appearing wall thickening, including of the mid ascending colon and sigmoid, similar to prior examination and suggesting chronic sequelae of diverticulitis. 3. No intraluminal contrast extravasation to localize reported GI bleeding. Aortic Atherosclerosis (ICD10-I70.0). Electronically Signed   By: Jearld Lesch M.D.   On: 11/07/2023 15:26       Kathlen Mody M.D. [facility] 11/08/2023, 2:39 PM  Available via Epic secure chat 7am-7pm After 7 pm, please refer to night coverage provider listed on amion.

## 2023-11-09 DIAGNOSIS — E1169 Type 2 diabetes mellitus with other specified complication: Secondary | ICD-10-CM | POA: Diagnosis not present

## 2023-11-09 DIAGNOSIS — D509 Iron deficiency anemia, unspecified: Secondary | ICD-10-CM | POA: Diagnosis not present

## 2023-11-09 DIAGNOSIS — E1159 Type 2 diabetes mellitus with other circulatory complications: Secondary | ICD-10-CM | POA: Diagnosis not present

## 2023-11-09 DIAGNOSIS — E876 Hypokalemia: Secondary | ICD-10-CM | POA: Diagnosis not present

## 2023-11-09 LAB — CBC WITH DIFFERENTIAL/PLATELET
Abs Immature Granulocytes: 0.04 10*3/uL (ref 0.00–0.07)
Basophils Absolute: 0.1 10*3/uL (ref 0.0–0.1)
Basophils Relative: 1 %
Eosinophils Absolute: 0.4 10*3/uL (ref 0.0–0.5)
Eosinophils Relative: 5 %
HCT: 33 % — ABNORMAL LOW (ref 36.0–46.0)
Hemoglobin: 9.9 g/dL — ABNORMAL LOW (ref 12.0–15.0)
Immature Granulocytes: 1 %
Lymphocytes Relative: 26 %
Lymphs Abs: 2.1 10*3/uL (ref 0.7–4.0)
MCH: 20.2 pg — ABNORMAL LOW (ref 26.0–34.0)
MCHC: 30 g/dL (ref 30.0–36.0)
MCV: 67.5 fL — ABNORMAL LOW (ref 80.0–100.0)
Monocytes Absolute: 0.8 10*3/uL (ref 0.1–1.0)
Monocytes Relative: 9 %
Neutro Abs: 4.9 10*3/uL (ref 1.7–7.7)
Neutrophils Relative %: 58 %
Platelets: 336 10*3/uL (ref 150–400)
RBC: 4.89 MIL/uL (ref 3.87–5.11)
RDW: 18.5 % — ABNORMAL HIGH (ref 11.5–15.5)
Smear Review: NORMAL
WBC: 8.3 10*3/uL (ref 4.0–10.5)
nRBC: 0 % (ref 0.0–0.2)

## 2023-11-09 LAB — GLUCOSE, CAPILLARY
Glucose-Capillary: 137 mg/dL — ABNORMAL HIGH (ref 70–99)
Glucose-Capillary: 122 mg/dL — ABNORMAL HIGH (ref 70–99)
Glucose-Capillary: 122 mg/dL — ABNORMAL HIGH (ref 70–99)

## 2023-11-09 MED ORDER — IRON SUCROSE 500 MG IVPB - SIMPLE MED
500.0000 mg | Freq: Once | INTRAVENOUS | Status: DC
  Filled 2023-11-09: qty 275

## 2023-11-09 MED ORDER — SODIUM CHLORIDE 0.9 % IV SOLN
500.0000 mg | Freq: Once | INTRAVENOUS | Status: AC
  Administered 2023-11-09: 500 mg via INTRAVENOUS
  Filled 2023-11-09: qty 25

## 2023-11-09 NOTE — Plan of Care (Signed)
  Problem: Education: Goal: Knowledge of General Education information will improve Description: Including pain rating scale, medication(s)/side effects and non-pharmacologic comfort measures Outcome: Progressing   Problem: Clinical Measurements: Goal: Will remain free from infection Outcome: Progressing   Problem: Clinical Measurements: Goal: Cardiovascular complication will be avoided Outcome: Progressing   Problem: Nutrition: Goal: Adequate nutrition will be maintained Outcome: Progressing   Problem: Coping: Goal: Level of anxiety will decrease Outcome: Progressing   Problem: Pain Management: Goal: General experience of comfort will improve Outcome: Progressing   Problem: Metabolic: Goal: Ability to maintain appropriate glucose levels will improve Outcome: Progressing   Problem: Tissue Perfusion: Goal: Adequacy of tissue perfusion will improve Outcome: Progressing

## 2023-11-09 NOTE — Plan of Care (Signed)
  Problem: Elimination: Goal: Will not experience complications related to bowel motility Outcome: Progressing   

## 2023-11-09 NOTE — Discharge Summary (Signed)
Physician Discharge Summary   Patient: Carol Mueller MRN: [id number] DOB: 26-Dec-1960  Admit date:     11/07/2023  Discharge date: {dischdate:26783}  Discharge Physician: Kathlen Mody   PCP: [name], DO   Recommendations at discharge:  {Tip this will not be part of the note when signed- Example include specific recommendations for outpatient follow-up, pending tests to follow-up on. (Optional):26781}  ***  Discharge Diagnoses: Principal Problem:   Acute lower GI bleeding Active Problems:   Pancolonic diverticulosis   Hypertension associated with diabetes (HCC)   Hyperlipidemia associated with type 2 diabetes mellitus (HCC)   Hypokalemia   Insulin-requiring or dependent type II diabetes mellitus (HCC)   Microcytic anemia  Resolved Problems:   * No resolved hospital problems. *  Hospital Course: Carol Mueller is a 62 y.o. female with medical history significant for T2DM, HTN, HLD, pancolonic diverticulosis with recurrent diverticular bleeding who is admitted with lower GI bleeding.  Assessment and Plan: No notes have been filed under this hospital service. Service: Hospitalist     {Tip this will not be part of the note when signed Body mass index is 38.79 kg/m. , ,  (Optional):26781}  {(NOTE) Pain control PDMP Statment (Optional):26782} Consultants: *** Procedures performed: ***  Disposition: {Plan; Disposition:26390} Diet recommendation:  Discharge Diet Orders (From admission, onward)     Start     Ordered   11/09/23 0000  Diet - low sodium heart healthy        11/09/23 1404           {Diet_Plan:26776} DISCHARGE MEDICATION: Allergies as of 11/09/2023       Reactions   Norvasc [amlodipine Besylate] Swelling   ANGIOEDEMA   Nsaids Other (See Comments)   GI upset/ GI bleed   Sulfa Antibiotics Other (See Comments)   Unknown reaction as a child        Medication List     TAKE these medications    acetaminophen 500 MG tablet Commonly  known as: TYLENOL Take 1,000 mg by mouth every 6 (six) hours as needed for mild pain.   atorvastatin 10 MG tablet Commonly known as: LIPITOR Take 10 mg by mouth daily.   carvedilol 25 MG tablet Commonly known as: COREG Take 25 mg by mouth 2 (two) times daily.   dicyclomine 20 MG tablet Commonly known as: BENTYL Take 1 tablet (20 mg total) by mouth every 8 (eight) hours as needed for spasms.   ferrous sulfate 325 (65 FE) MG EC tablet Take 1 tablet (325 mg total) by mouth every other day.   Jardiance 10 MG Tabs tablet Generic drug: empagliflozin Take 10 mg by mouth daily.   metFORMIN 500 MG tablet Commonly known as: GLUCOPHAGE Take 500 mg by mouth 2 (two) times daily with a meal.   methocarbamol 500 MG tablet Commonly known as: ROBAXIN Take 500 mg by mouth every 6 (six) hours as needed for muscle spasms.   multivitamin with minerals tablet Take 1 tablet by mouth daily.   NovoLOG Mix 70/30 FlexPen (70-30) 100 UNIT/ML FlexPen Generic drug: insulin aspart protamine - aspart Inject 0-30 Units into the skin 2 (two) times daily. Dose per sliding scale.   olmesartan 40 MG tablet Commonly known as: BENICAR Take 40 mg by mouth daily.   Ozempic (1 MG/DOSE) 2 MG/1.5ML Sopn Generic drug: Semaglutide (1 MG/DOSE) Inject 1 mg into the skin once a week.   pantoprazole 20 MG tablet Commonly known as: PROTONIX Take 1 tablet (20 mg total) by  mouth daily for 14 days.        Follow-up Information     [name], DO. Schedule an appointment as soon as possible for a visit in 1 week(s).   Specialty: Family Medicine Contact information: [address] [place] [address] [phone]                Discharge Exam: Ceasar Mons Weights   11/07/23 1118  Weight: 99.3 kg   ***  Condition at discharge: {DC Condition:26389}  The results of significant diagnostics from this hospitalization (including imaging, microbiology, ancillary and laboratory) are listed  below for reference.   Imaging Studies: CT ANGIO GI BLEED Result Date: 11/07/2023 CLINICAL DATA:  Mesenteric ischemia, bloody stool left lower quadrant pain for 3 days EXAM: CTA ABDOMEN AND PELVIS WITHOUT AND WITH CONTRAST TECHNIQUE: Multidetector CT imaging of the abdomen and pelvis was performed using the standard protocol during bolus administration of intravenous contrast. Multiplanar reconstructed images and MIPs were obtained and reviewed to evaluate the vascular anatomy. RADIATION DOSE REDUCTION: This exam was performed according to the departmental dose-optimization program which includes automated exposure control, adjustment of the mA and/or kV according to patient size and/or use of iterative reconstruction technique. CONTRAST:  75mL OMNIPAQUE IOHEXOL 350 MG/ML SOLN COMPARISON:  07/20/2023 FINDINGS: VASCULAR Normal contour and caliber of the abdominal aorta. No evidence of aneurysm, dissection, or other acute aortic pathology. 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 The mesenteric arteries and their proximal branch vessels are patent. Review of the MIP images confirms the above findings. NON-VASCULAR Lower Chest: No acute findings. Hepatobiliary: No focal liver abnormality is seen. Status post cholecystectomy. No biliary dilatation. Pancreas: Unremarkable. No pancreatic ductal dilatation or surrounding inflammatory changes. Spleen: Normal in size without significant abnormality. Adrenals/Urinary Tract: Status post right adrenalectomy. Left adrenal is normal. Multiple simple, benign bilateral renal cortical cysts, for which no further follow-up or characterization is required. Kidneys are otherwise normal, without renal calculi, solid lesion, or hydronephrosis. Bladder is unremarkable. Stomach/Bowel: Stomach is within normal limits. Appendix appears normal. No evidence of bowel wall thickening, distention, or  inflammatory changes. Pancolonic diverticulosis without evidence of acute diverticulitis. Multiple chronically inspissated hyperdense diverticula and multiple areas of chronic appearing wall thickening, including of the mid ascending colon and sigmoid, similar to prior examination (series 16, image 101, 165). No intraluminal contrast extravasation to localize reported GI bleeding. Lymphatic: No enlarged abdominal or pelvic lymph nodes. Reproductive: Bulky calcified uterine fibroids. Other: No abdominal wall hernia or abnormality. No ascites. Musculoskeletal: No acute osseous findings. IMPRESSION: 1. The mesenteric arteries and their proximal branch vessels are patent. No evidence of mesenteric ischemia. 2. Pancolonic diverticulosis without evidence of acute diverticulitis. Multiple chronically inspissated hyperdense diverticula and multiple areas of chronic appearing wall thickening, including of the mid ascending colon and sigmoid, similar to prior examination and suggesting chronic sequelae of diverticulitis. 3. No intraluminal contrast extravasation to localize reported GI bleeding. Aortic Atherosclerosis (ICD10-I70.0). Electronically Signed   By: Jearld Lesch M.D.   On: 11/07/2023 15:26    Microbiology: Results for orders placed or performed during the hospital encounter of 07/08/21  SARS CORONAVIRUS 2 (TAT 6-24 HRS) Nasopharyngeal Nasopharyngeal Swab     Status: None   Collection Time: 07/08/21 10:27 PM   Specimen: Nasopharyngeal Swab  Result Value Ref Range Status   SARS Coronavirus 2 NEGATIVE NEGATIVE Final    Comment: (NOTE) SARS-CoV-2 target nucleic acids are NOT  DETECTED.  The SARS-CoV-2 RNA is generally detectable in upper and lower respiratory specimens during the acute phase of infection. Negative results do not preclude SARS-CoV-2 infection, do not rule out co-infections with other pathogens, and should not be used as the sole basis for treatment or other patient management  decisions. Negative results must be combined with clinical observations, patient history, and epidemiological information. The expected result is Negative.  Fact Sheet for Patients: HairSlick.no  Fact Sheet for Healthcare Providers: quierodirigir.com  This test is not yet approved or cleared by the [place] FDA and  has been authorized for detection and/or diagnosis of SARS-CoV-2 by FDA under an Emergency Use Authorization (EUA). This EUA will remain  in effect (meaning this test can be used) for the duration of the COVID-19 declaration under Se ction 564(b)(1) of the Act, 21 U.S.C. section 360bbb-3(b)(1), unless the authorization is terminated or revoked sooner.  Performed at [facility], [address], [place], [place] [address]     Labs: CBC: Recent Labs  Lab 11/07/23 1124 11/07/23 1801 11/07/23 2254 11/08/23 0432 11/08/23 1941 11/09/23 0611  WBC 10.3 10.6*  --  8.7  --  8.3  NEUTROABS 6.6  --   --   --   --  4.9  HGB 11.7* 10.2* 10.2* 10.1* 9.9* 9.9*  HCT 38.5 33.7* 33.6* 32.9* 33.1* 33.0*  MCV 68.1* 67.3*  --  67.0*  --  67.5*  PLT 364 334  --  334  --  336   Basic Metabolic Panel: Recent Labs  Lab 11/07/23 1124 11/08/23 0432  NA 137 137  K 3.4* 3.5  CL 103 103  CO2 25 24  GLUCOSE 100* 79  BUN 8 5*  CREATININE 0.77 0.96  CALCIUM 9.5 9.0   Liver Function Tests: Recent Labs  Lab 11/07/23 1124  AST 18  ALT 13  ALKPHOS 56  BILITOT 0.5  PROT 7.6  ALBUMIN 3.6   CBG: Recent Labs  Lab 11/08/23 1154 11/08/23 1652 11/09/23 0146 11/09/23 0753 11/09/23 1201  GLUCAP 92 107* 137* 122* 122*    Discharge time spent: {LESS THAN/GREATER THAN:26388} 30 minutes.  Signed: Kathlen Mody, MD [facility] 11/09/2023

## 2023-11-18 ENCOUNTER — Encounter: Payer: Self-pay | Admitting: Gastroenterology

## 2023-11-18 ENCOUNTER — Ambulatory Visit: Payer: No Typology Code available for payment source | Admitting: Gastroenterology

## 2023-11-18 VITALS — BP 138/86 | HR 68 | Ht 63.0 in | Wt 220.4 lb

## 2023-11-18 DIAGNOSIS — K5790 Diverticulosis of intestine, part unspecified, without perforation or abscess without bleeding: Secondary | ICD-10-CM | POA: Diagnosis not present

## 2023-11-18 DIAGNOSIS — D509 Iron deficiency anemia, unspecified: Secondary | ICD-10-CM | POA: Diagnosis not present

## 2023-11-18 DIAGNOSIS — R110 Nausea: Secondary | ICD-10-CM | POA: Diagnosis not present

## 2023-11-18 DIAGNOSIS — R1012 Left upper quadrant pain: Secondary | ICD-10-CM

## 2023-11-18 NOTE — Patient Instructions (Addendum)
 _______________________________________________________  If your blood pressure at your visit was 140/90 or greater, please contact your primary care physician to follow up on this.  If you are age 64 or younger, your body mass index should be between 19-25. Your Body mass index is 39.04 kg/m. If this is out of the aformentioned range listed, please consider follow up with your Primary Care Provider.  ________________________________________________________  The  GI providers would like to encourage you to use MYCHART to communicate with providers for non-urgent requests or questions.  Due to long hold times on the telephone, sending your provider a message by [facility] may be a faster and more efficient way to get a response.  Please allow 48 business hours for a response.  Please remember that this is for non-urgent requests.  _______________________________________________________  Your provider has requested that you go to the basement level for lab work in 4 weeks (week of 12-20-23). Press B on the elevator. The lab is located at the first door on the left as you exit the elevator.  You have been scheduled for an endoscopy. Please follow written instructions given to you at your visit today.  If you use inhalers (even only as needed), please bring them with you on the day of your procedure.  If you take any of the following medications, they will need to be adjusted prior to your procedure:   DO NOT TAKE 7 DAYS PRIOR TO TEST- Trulicity (dulaglutide) Ozempic, Wegovy (semaglutide) Mounjaro (tirzepatide) Bydureon  Bcise (exanatide extended release)  DO NOT TAKE 1 DAY PRIOR TO YOUR TEST Rybelsus (semaglutide) Adlyxin (lixisenatide) Victoza (liraglutide) Byetta  (exanatide) ___________________________________________________________________________  Rosine have been scheduled for an appointment with __________ at [facility]. Your appointment is on _______ at _______.  Please arrive at ________ for registration. Make certain to bring a list of current medications, including any over the counter medications or vitamins. Also bring your co-pay if you have one as well as your insurance cards. [facility] is located at [address]. Should you need to reschedule your appointment, please contact them at [phone].  Due to recent changes in healthcare laws, you may see the results of your imaging and laboratory studies on MyChart before your provider has had a chance to review them.  We understand that in some cases there may be results that are confusing or concerning to you. Not all laboratory results come back in the same time frame and the provider may be waiting for multiple results in order to interpret others.  Please give us  48 hours in order for your provider to thoroughly review all the results before contacting the office for clarification of your results.   It was a pleasure to see you today!  Vito Cirigliano, D.O.

## 2023-11-18 NOTE — Progress Notes (Signed)
 Chief Complaint:    Hospital follow-up  GI History: 63 y.o. Mueller with a past medical history of hypertension, hyperlipidemia, type II, Cushing's syndrome, colon polyps and pandiverticulosis with recurrent diverticular bleed. Past cholecystectomy, adrenalectomy and partial hysterectomy.  She has had multiple hospital admissions for recurrent, stuttering diverticular bleed.  First bleed was 06/2015.  CT scan with scattered diverticulosis.  Hospital admission in 05/2016 with recurrent bleed.  CT angiogram was notable for extensive colonic diverticulosis with active diverticular bleed in the proximal descending colon just beyond the splenic flexure.  Was taken to the IR suite but angiogram was negative for active bleeding.  ER evaluation for recurrent bleed in 05/2018.  CT abdomen with extensive diverticulosis without diverticulitis.  Hospital admission 10/2020.  Tagged RBC scan unremarkable.    Hospital admission in 11/2020.  Negative CTA and negative tagged RBC scan.  Unremarkable EGD, colonoscopy, video capsule endoscopy and diagnosed with self-limiting diverticular bleed.  Admitted 07/07/23-07/15/23 for likely diverticular bleed.  Required 3 unit PRBC transfusion.  Multiple CT angiogram studies were negative for active bleeding.  Hemoglobin stabilized at 8.2 on discharge.   Admitted 07/19/23-07/22/23 again for lower GI bleeding.  Required 1 unit PRBC transfusion.  CT angiogram again negative for source of bleeding.  Underwent colonoscopy 9/4 which showed pancolonic diverticulosis without evidence of active bleeding.  GI felt this was likely stuttering diverticular bleeding.  Hemoglobin 8.1 on discharge.   GI PROCEDURES:  Colonoscopy 07/21/2023: Pandiverticulosis with highest burden in the sigmoid colon.  No blood or heme noted anywhere.  No stigmata of recent bleeding.   Small bowel capsule endoscopy 12/03/2020: Small area of focal erythema versus subtle nonbleeding erosion in the distal small  bowel. This appears to be an incidental finding and not likely to be of clinical consequence and unlikely to be source of recurrent bleeding. This would be out of reach of a single balloon enteroscopy and do not feel that referral for retrograde double-balloon enteroscopy is necessary. Otherwise completely normal study.  No blood noted throughout the small bowel lumen and visualized colon. Based on these findings and recent extensive evaluation favor recurrent diverticular bleed.   EGD 11/28/2020: - Ectopic gastric mucosa in the upper third of the esophagus.  Otherwise normal esophagus.  Benign gastric polyps, otherwise normal stomach and normal duodenum.  Successful endoscopic placement of VCE.   Colonoscopy 11/22/2020: - One 9 mm polyp at the appendiceal orifice, removed with a cold snare (path: Benign), one 6 mm hyperplastic polyp in the transverse colon, internal hemorrhoid, moderate diverticulosis in the entire examined colon. There was evidence of an impacted diverticulum. There was no evidence of diverticular bleeding. No blood in the colon.   Colonoscopy 10/03/2019: - Two 6 to 8 mm polyps in the descending colon and in the transverse colon, removed with a cold snare. Resected and retrieved.  - Mild diverticulosis in the right colon. There was no evidence of diverticular bleeding.  - Moderate diverticulosis in the left colon. There was no evidence of diverticular bleeding.  - Internal hemorrhoids.  - The examination was otherwise normal on direct and retroflexion views  EGD 07/16/2015: Normal esophagus, stomach, duodenum  Colonoscopy 01/09/2013 (routine CRC screening): Moderate left-sided diverticulosis, otherwise normal  HPI:     Patient is a 63 y.o. Mueller presenting [facility]-up.  She was admitted 12/22-24 with recurrent, stuttering diverticular bleed.  CT angio was again negative for active bleeding, with diverticulosis and no diverticulitis.   She was monitored, treated  with IV iron  (ferritin 5, iron  36, sat 9%) and discharged with hemoglobin 9.9.  Repeat labs from PCP from [facility] this week view on her phone: - WBC 9.1, Hgb 10.7, Hct 36.1, PLT 472 - MCV 69, RDW 19.7  Today, she states no bleeding from hospital d/c last week.   Separately, has daily LUQ pain.  Pain can last throughout the day, but will have episodic sharp pains.  Tends to be worse with salmon, tomato-based sauces/foods, occurring immediately after eating these types of foods.  Occasional nausea, but no emesis.  Symptoms have been present for a couple of years now, but perhaps worse over the last few months.  Does report having melenic stool during hospitalization in September, and she is concerned about overlapping UGI pathology.    Hx of GERD, but not currently taking any acid suppression therapy. Previously prescribed Protonix . Will use Mylanta prn nocturnal breakthrough, about 1 every 2 weeks.   Last Ozempic injection was last Friday. Has been taking for >1 year.   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     atorvastatin  (LIPITOR) 10 MG tablet Take 10 mg by mouth daily.     carvedilol  (COREG ) 25 MG tablet Take 25 mg by mouth 2 (two) times daily.     dicyclomine  (BENTYL ) 20 MG tablet Take 1 tablet (20 mg total) by mouth every 8  (eight) hours as needed for spasms. 30 tablet 0   empagliflozin  (JARDIANCE ) 10 MG TABS tablet Take 10 mg by mouth daily.     ferrous sulfate  325 (65 FE) MG EC tablet Take 1 tablet (325 mg total) by mouth every other day. 45 tablet 3   insulin  aspart protamine - aspart (NOVOLOG  MIX 70/30 FLEXPEN) (70-30) 100 UNIT/ML FlexPen Inject 0-30 Units into the skin 2 (two) times daily. Dose per sliding scale.     metFORMIN  (GLUCOPHAGE ) 500 MG tablet Take 500 mg by mouth 2 (two) times daily with a meal.     methocarbamol  (ROBAXIN ) 500 MG tablet Take 500 mg by mouth every 6 (six) hours as needed for muscle spasms.     Multiple Vitamins-Minerals (MULTIVITAMIN WITH MINERALS) tablet Take 1 tablet by mouth daily.     olmesartan  (BENICAR ) 40 MG tablet Take 40 mg by mouth daily.     pantoprazole  (PROTONIX ) 20 MG tablet Take 1 tablet (20 mg total) by mouth daily for 14 days. (Patient not taking: Reported on 11/08/2023) 14 tablet 0   Semaglutide, 1 MG/DOSE, (OZEMPIC, 1 MG/DOSE,) 2 MG/1.5ML SOPN Inject 1 mg into the skin once a week.     No current facility-administered medications for this visit.    Physical Exam:     BP 138/86   Pulse 68   Ht 5' 3 (1.6 m)  Wt 220 lb 6 oz (100 kg)   SpO2 98%   BMI 39.04 kg/m   GENERAL:  Pleasant Mueller in NAD PSYCH: : Cooperative, normal affect Musculoskeletal:  Normal muscle tone, normal strength NEURO: Alert and oriented x 3, no focal neurologic deficits   IMPRESSION and PLAN:    1) Diverticulosis with recurrent diverticular bleed Multiple hospitalizations for recurrent, stuttering diverticular bleed.  Had 1 positive CT angiography in 05/2016, but unfortunately subsequent angiogram was negative.  Otherwise, has had multiple advanced imaging studies including tagged RBC scan, multiple CT angiography's, colonoscopy, VCE, EGD, have all been without active bleed.  She does have extensive pandiverticulosis on imaging and colonoscopy.  We discussed recurrent diverticular  bleed at length today.  3 hospital admissions in the last 5 months or so, including requiring RBC transfusion and IV iron .  We discussed the role/utility of possible surgery.  Otherwise, very limited in ability to prevent recurrent diverticular bleed.  - Referral to Colorectal Surgery to at least discuss surgical option - Repeat CBC earlier this week shows uptrending H/H - Repeat CBC and iron  panel in 4 weeks  2) LUQ pain 3) Nausea 4) Iron  deficiency anemia - EGD to evaluate for mucosal extraluminal pathology  5) Diabetes - Hold Ozempic 1 week prior to EGD   The indications, risks, and benefits of EGD were explained to the patient in detail. Risks include but are not limited to bleeding, perforation, adverse reaction to medications, and cardiopulmonary compromise. Sequelae include but are not limited to the possibility of surgery, hospitalization, and mortality. The patient verbalized understanding and wished to proceed. All questions answered, referred to scheduler. Further recommendations pending results of the exam.          Sandor LULLA Flatter ,DO, FACG 11/18/2023, 4:07 PM

## 2023-11-19 ENCOUNTER — Encounter: Payer: Self-pay | Admitting: Gastroenterology

## 2023-11-23 ENCOUNTER — Ambulatory Visit: Payer: No Typology Code available for payment source | Admitting: Gastroenterology

## 2023-11-23 ENCOUNTER — Other Ambulatory Visit: Payer: Self-pay | Admitting: Nurse Practitioner

## 2023-11-23 ENCOUNTER — Encounter: Payer: Self-pay | Admitting: Gastroenterology

## 2023-11-23 VITALS — BP 139/76 | HR 74 | Temp 97.9°F | Resp 11 | Ht 63.0 in | Wt 220.0 lb

## 2023-11-23 DIAGNOSIS — K219 Gastro-esophageal reflux disease without esophagitis: Secondary | ICD-10-CM | POA: Diagnosis not present

## 2023-11-23 DIAGNOSIS — K317 Polyp of stomach and duodenum: Secondary | ICD-10-CM | POA: Diagnosis not present

## 2023-11-23 DIAGNOSIS — D130 Benign neoplasm of esophagus: Secondary | ICD-10-CM | POA: Diagnosis not present

## 2023-11-23 DIAGNOSIS — D509 Iron deficiency anemia, unspecified: Secondary | ICD-10-CM

## 2023-11-23 DIAGNOSIS — R1012 Left upper quadrant pain: Secondary | ICD-10-CM

## 2023-11-23 DIAGNOSIS — K2289 Other specified disease of esophagus: Secondary | ICD-10-CM | POA: Diagnosis not present

## 2023-11-23 DIAGNOSIS — K921 Melena: Secondary | ICD-10-CM

## 2023-11-23 DIAGNOSIS — K2970 Gastritis, unspecified, without bleeding: Secondary | ICD-10-CM | POA: Diagnosis not present

## 2023-11-23 DIAGNOSIS — R110 Nausea: Secondary | ICD-10-CM

## 2023-11-23 DIAGNOSIS — K2281 Esophageal polyp: Secondary | ICD-10-CM

## 2023-11-23 MED ORDER — SODIUM CHLORIDE 0.9 % IV SOLN: 500.0000 mL | Freq: Once | INTRAVENOUS | Status: DC

## 2023-11-23 NOTE — Patient Instructions (Addendum)
    Handout on Gastritis given to you today  Await biopsy results of biopsies done and on gastric and esophageal polyps removed   Continue previous diet & medications   YOU HAD AN ENDOSCOPIC PROCEDURE TODAY AT THE [facility]:   Refer to the procedure report that was given to you for any specific questions about what was found during the examination.  If the procedure report does not answer your questions, please call your gastroenterologist to clarify.  If you requested that your care partner not be given the details of your procedure findings, then the procedure report has been included in a sealed envelope for you to review at your convenience later.  YOU SHOULD EXPECT: Some feelings of bloating in the abdomen. Passage of more gas than usual.  Walking can help get rid of the air that was put into your GI tract during the procedure and reduce the bloating. If you had a lower endoscopy (such as a colonoscopy or flexible sigmoidoscopy) you may notice spotting of blood in your stool or on the toilet paper. If you underwent a bowel prep for your procedure, you may not have a normal bowel movement for a few days.  Please Note:  You might notice some irritation and congestion in your nose or some drainage.  This is from the oxygen used during your procedure.  There is no need for concern and it should clear up in a day or so.  SYMPTOMS TO REPORT IMMEDIATELY:   Following upper endoscopy (EGD)  Vomiting of blood or coffee ground material  New chest pain or pain under the shoulder blades  Painful or persistently difficult swallowing  New shortness of breath  Fever of 100F or higher  Black, tarry-looking stools  For urgent or emergent issues, a gastroenterologist can be reached at any hour by calling [phone]. Do not use MyChart messaging for urgent concerns.    DIET:  We do recommend a small meal at first, but then you may proceed to your regular diet.  Drink plenty of  fluids but you should avoid alcoholic beverages for 24 hours.  ACTIVITY:  You should plan to take it easy for the rest of today and you should NOT DRIVE or use heavy machinery until tomorrow (because of the sedation medicines used during the test).    FOLLOW UP: Our staff will call the number listed on your records the next business day following your procedure.  We will call around 7:15- 8:00 am to check on you and address any questions or concerns that you may have regarding the information given to you following your procedure. If we do not reach you, we will leave a message.     If any biopsies were taken you will be contacted by phone or by letter within the next 1-3 weeks.  Please call us  at [phone] if you have not heard about the biopsies in 3 weeks.    SIGNATURES/CONFIDENTIALITY: You and/or your care partner have signed paperwork which will be entered into your electronic medical record.  These signatures attest to the fact that that the information above on your After Visit Summary has been reviewed and is understood.  Full responsibility of the confidentiality of this discharge information lies with you and/or your care-partner.

## 2023-11-23 NOTE — Progress Notes (Signed)
 Called to room to assist during endoscopic procedure.  Patient ID and intended procedure confirmed with present staff. Received instructions for my participation in the procedure from the performing physician.

## 2023-11-23 NOTE — Progress Notes (Signed)
 Sedate, gd SR, tolerated procedure well, VSS, report to RN

## 2023-11-23 NOTE — Progress Notes (Signed)
 Pt's states no medical or surgical changes since previsit or office visit.

## 2023-11-23 NOTE — Op Note (Signed)
 [facility] Patient Name: Tom Ragsdale Procedure Date: 11/23/2023 2:05 PM MRN: [id number] Endoscopist: Sandor Flatter , MD, [id number] Age: 63 Referring MD:  Date of Birth: 23-Nov-1960 Gender: Female Account #: [id number] Procedure:                Upper GI endoscopy Indications:              Abdominal pain in the left upper quadrant, Iron                             deficiency anemia, Suspected esophageal reflux,                            Nausea Medicines:                Monitored Anesthesia Care Procedure:                Pre-Anesthesia Assessment:                           - Prior to the procedure, a History and Physical                            was performed, and patient medications and                            allergies were reviewed. The patient's tolerance of                            previous anesthesia was also reviewed. The risks                            and benefits of the procedure and the sedation                            options and risks were discussed with the patient.                            All questions were answered, and informed consent                            was obtained. Prior Anticoagulants: The patient has                            taken no anticoagulant or antiplatelet agents. ASA                            Grade Assessment: II - A patient with mild systemic                            disease. After reviewing the risks and benefits,                            the patient was deemed in satisfactory condition to  undergo the procedure.                           After obtaining informed consent, the endoscope was                            passed under direct vision. Throughout the                            procedure, the patient's blood pressure, pulse, and                            oxygen saturations were monitored continuously. The                            Olympus Scope SN [id number] was introduced through  the                            mouth, and advanced to the second part of duodenum.                            The upper GI endoscopy was accomplished without                            difficulty. The patient tolerated the procedure                            well. Scope In: Scope Out: Findings:                 A single area of ectopic gastric mucosa was found                            in the upper third of the esophagus.                           A single 3 mm polyp with no bleeding was found 37                            cm from the incisors. The polyp was removed with a                            cold biopsy forceps. Resection and retrieval were                            complete. Estimated blood loss was minimal.                           The Z-line was regular and was found 40 cm from the                            incisors. Hill Grade 3 valve noted on retroflexed  views.                           Diffuse mild inflammation characterized by                            congestion (edema) and erythema was found in the                            gastric fundus and in the gastric body. Biopsies                            were taken with a cold forceps for Helicobacter                            pylori testing. Estimated blood loss was minimal.                           A single 8 mm sessile polyp with no stigmata of                            recent bleeding was found in the gastric body. The                            polyp was removed with a cold snare. Resection and                            retrieval were complete. Estimated blood loss was                            minimal.                           The examined duodenum was normal. Biopsies were                            taken with a cold forceps for histology. Estimated                            blood loss was minimal. Complications:            No immediate complications. Estimated Blood Loss:      Estimated blood loss was minimal. Impression:               - Ectopic gastric mucosa in the upper third of the                            esophagus.                           - Esophageal polyp was found in the lower                            esophagus. Resected and retrieved.                           -  Z-line regular, 40 cm from the incisors.                           - Non-ulcer gastritis. Biopsied.                           - A single gastric polyp. Resected and retrieved.                           - Normal examined duodenum. Biopsied. Recommendation:           - Patient has a contact number available for                            emergencies. The signs and symptoms of potential                            delayed complications were discussed with the                            patient. Return to normal activities tomorrow.                            Written discharge instructions were provided to the                            patient.                           - Resume previous diet.                           - Continue present medications.                           - Await pathology results. Sandor Flatter, MD 11/23/2023 2:38:56 PM

## 2023-11-23 NOTE — Progress Notes (Signed)
 GASTROENTEROLOGY PROCEDURE H&P NOTE   Primary Care Physician: Gerome Brunet, DO    Reason for Procedure:   LUQ pain, nausea w/o emesis, dark stool, GERD, iron  deficiency anemia  Plan:    EGD  Patient is appropriate for endoscopic procedure(s) in the ambulatory ([facility]) setting.  The nature of the procedure, as well as the risks, benefits, and alternatives were carefully and thoroughly reviewed with the patient. Ample time for discussion and questions allowed. The patient understood, was satisfied, and agreed to proceed.     HPI: Carol Mueller is a 63 y.o. female who presents for EGD for evaluation of LUQ pain, nausea, IDA, and recent possible melenic stool.  Additionally, has history of GERD, not currently on any suppression therapy.  Patient was most recently seen in the Gastroenterology Clinic on 11/18/2023 by.  No interval change in medical history since that appointment. Please refer to that note for full details regarding GI history and clinical presentation.   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   Diabetes mellitus without complication (HCC)    insulin -dependent   Diverticulosis    GI bleed    Hyperlipidemia    Hypertension    Polycystic ovary disease    Sepsis (HCC) 05/2019    Past Surgical History:  Procedure Laterality Date   ABDOMINAL HYSTERECTOMY  01/2023   ADRENALECTOMY  1996   BREAST BIOPSY Right 2018   BREAST BIOPSY Left    x2   CHOLECYSTECTOMY     COLONOSCOPY WITH PROPOFOL  N/A 10/03/2019   Procedure: COLONOSCOPY WITH PROPOFOL ;  Surgeon: Aneita Gwendlyn DASEN, MD;  Location: [facility];  Service: Endoscopy;  Laterality: N/A;   COLONOSCOPY WITH PROPOFOL  N/A 11/22/2020   Procedure: COLONOSCOPY WITH PROPOFOL ;  Surgeon: Aneita Gwendlyn DASEN, MD;  Location: [facility];  Service: Endoscopy;  Laterality: N/A;   COLONOSCOPY WITH PROPOFOL  N/A 07/21/2023   Procedure: COLONOSCOPY WITH PROPOFOL ;  Surgeon:  Leigh Elspeth SQUIBB, MD;  Location: [facility] ENDOSCOPY;  Service: Gastroenterology;  Laterality: N/A;   ESOPHAGOGASTRODUODENOSCOPY (EGD) WITH PROPOFOL  N/A 07/16/2015   Procedure: ESOPHAGOGASTRODUODENOSCOPY (EGD) WITH PROPOFOL ;  Surgeon: Toribio SQUIBB Cedar, MD;  Location: [facility];  Service: Endoscopy;  Laterality: N/A;   ESOPHAGOGASTRODUODENOSCOPY (EGD) WITH PROPOFOL  N/A 11/28/2020   Procedure: ESOPHAGOGASTRODUODENOSCOPY (EGD) WITH PROPOFOL ;  Surgeon: San Sandor GAILS, DO;  Location: [facility];  Service: Gastroenterology;  Laterality: N/A;   GIVENS CAPSULE STUDY N/A 11/28/2020   Procedure: GIVENS CAPSULE STUDY;  Surgeon: San Sandor GAILS, DO;  Location: [facility];  Service: Gastroenterology;  Laterality: N/A;   POLYPECTOMY  10/03/2019   Procedure: POLYPECTOMY;  Surgeon: Aneita Gwendlyn DASEN, MD;  Location: [facility];  Service: Endoscopy;;   POLYPECTOMY  11/22/2020   Procedure: POLYPECTOMY;  Surgeon: Aneita Gwendlyn DASEN, MD;  Location: [facility];  Service: Endoscopy;;    Prior to Admission medications   Medication Sig Start Date End Date Taking? Authorizing Provider  acetaminophen  (TYLENOL ) 500 MG tablet Take 1,000 mg by mouth every 6 (six) hours as needed for mild pain.   Yes [provider]  atorvastatin  (LIPITOR) 10 MG tablet Take 10 mg by mouth daily. 03/06/19  Yes [provider]  carvedilol  (COREG ) 25 MG tablet Take 25 mg by mouth 2 (two) times daily. 10/30/20  Yes [provider]  Continuous Glucose Sensor (DEXCOM G7 SENSOR) MISC  11/22/23  Yes [provider]  dicyclomine  (BENTYL ) 20 MG tablet Take 1 tablet (  20 mg total) by mouth every 8 (eight) hours as needed for spasms. 08/11/23  Yes Kerman Vina HERO, NP  empagliflozin  (JARDIANCE ) 10 MG TABS tablet Take 10 mg by mouth daily.   Yes [provider]  ferrous sulfate  325 (65 FE) MG EC tablet Take 1 tablet (325 mg total) by mouth every other day. 07/22/23 07/21/24 Yes Wouk, Devaughn Sayres, MD   insulin  aspart protamine - aspart (NOVOLOG  MIX 70/30 FLEXPEN) (70-30) 100 UNIT/ML FlexPen Inject 0-30 Units into the skin 2 (two) times daily. Dose per sliding scale.   Yes [provider]  metFORMIN  (GLUCOPHAGE ) 500 MG tablet Take 500 mg by mouth 2 (two) times daily with a meal.   Yes [provider]  methocarbamol  (ROBAXIN ) 500 MG tablet Take 500 mg by mouth every 6 (six) hours as needed for muscle spasms.   Yes [provider]  Multiple Vitamins-Minerals (MULTIVITAMIN WITH MINERALS) tablet Take 1 tablet by mouth daily.   Yes [provider]  olmesartan  (BENICAR ) 40 MG tablet Take 40 mg by mouth daily. 10/11/19  Yes [provider]  OZEMPIC, 1 MG/DOSE, 4 MG/3ML SOPN Inject 4 mg into the skin. 11/21/23  Yes [provider]    Current Outpatient Medications  Medication Sig Dispense Refill   acetaminophen  (TYLENOL ) 500 MG tablet Take 1,000 mg by mouth every 6 (six) hours as needed for mild pain.     atorvastatin  (LIPITOR) 10 MG tablet Take 10 mg by mouth daily.     carvedilol  (COREG ) 25 MG tablet Take 25 mg by mouth 2 (two) times daily.     Continuous Glucose Sensor (DEXCOM G7 SENSOR) MISC      dicyclomine  (BENTYL ) 20 MG tablet Take 1 tablet (20 mg total) by mouth every 8 (eight) hours as needed for spasms. 30 tablet 0   empagliflozin  (JARDIANCE ) 10 MG TABS tablet Take 10 mg by mouth daily.     ferrous sulfate  325 (65 FE) MG EC tablet Take 1 tablet (325 mg total) by mouth every other day. 45 tablet 3   insulin  aspart protamine - aspart (NOVOLOG  MIX 70/30 FLEXPEN) (70-30) 100 UNIT/ML FlexPen Inject 0-30 Units into the skin 2 (two) times daily. Dose per sliding scale.     metFORMIN  (GLUCOPHAGE ) 500 MG tablet Take 500 mg by mouth 2 (two) times daily with a meal.     methocarbamol  (ROBAXIN ) 500 MG tablet Take 500 mg by mouth every 6 (six) hours as needed for muscle spasms.     Multiple Vitamins-Minerals (MULTIVITAMIN WITH MINERALS) tablet Take 1  tablet by mouth daily.     olmesartan  (BENICAR ) 40 MG tablet Take 40 mg by mouth daily.     OZEMPIC, 1 MG/DOSE, 4 MG/3ML SOPN Inject 4 mg into the skin.     Current Facility-Administered Medications  Medication Dose Route Frequency Provider Last Rate Last Admin   0.9 %  sodium chloride  infusion  500 mL Intravenous Once Raychell Holcomb V, DO        Allergies as of 11/23/2023 - Review Complete 11/23/2023  Allergen Reaction Noted   Norvasc [amlodipine besylate] Swelling 02/22/2012   Nsaids Other (See Comments) 08/07/2015   Sulfa antibiotics Other (See Comments) 02/22/2012    Family History  Problem Relation Age of Onset   Hypertension Mother    Cancer Mother        ?uterine or ovarian cancer   Hypertension Father    Diabetes Father    Hypertension Sister    Diabetes Sister  Hypertension Brother    Diabetes Brother    Breast cancer Maternal Grandmother    Hypertension Maternal Grandfather    Diabetes Paternal Grandmother    Colon cancer Paternal Grandfather    Stomach cancer Maternal Great-grandfather    Rectal cancer Neg Hx    Esophageal cancer Neg Hx     Social History   Socioeconomic History   Marital status: Single    Spouse name: N/A   Number of children: 0   Years of education: Not on file   Highest education level: Not on file  Occupational History   Occupation: NURSE    Comment: [employer]  Tobacco Use   Smoking status: Never   Smokeless tobacco: Never  Vaping Use   Vaping status: Never Used  Substance and Sexual Activity   Alcohol use: No   Drug use: No   Sexual activity: Not Currently    Birth control/protection: Post-menopausal  Other Topics Concern   Not on file  Social History Narrative   Lives alone. Parents live in [place].   Social Drivers of Corporate Investment Banker Strain: Not on file  Food Insecurity: No Food Insecurity (11/07/2023)   Hunger Vital Sign    Worried About Running Out of Food in the Last Year: Never true    Ran Out  of Food in the Last Year: Never true  Transportation Needs: No Transportation Needs (11/07/2023)   PRAPARE - Administrator, Civil Service (Medical): No    Lack of Transportation (Non-Medical): No  Physical Activity: Not on file  Stress: Not on file  Social Connections: Not on file  Intimate Partner Violence: Not At Risk (11/07/2023)   Humiliation, Afraid, Rape, and Kick questionnaire    Fear of Current or Ex-Partner: No    Emotionally Abused: No    Physically Abused: No    Sexually Abused: No    Physical Exam: Vital signs in last 24 hours: @BP  (!) 159/88   Pulse 72   Temp 97.9 F (36.6 C) (Temporal)   Ht 5' 3 (1.6 m)   Wt 220 lb (99.8 kg)   SpO2 97%   BMI 38.97 kg/m  GEN: NAD EYE: Sclerae anicteric ENT: MMM CV: Non-tachycardic Pulm: CTA b/l GI: Soft, NT/ND NEURO:  Alert & Oriented x 3   Sandor Flatter, DO [facility]   11/23/2023 2:10 PM

## 2023-11-24 ENCOUNTER — Telehealth: Payer: Self-pay | Admitting: *Deleted

## 2023-11-24 NOTE — Telephone Encounter (Signed)
  Follow up Call-     11/23/2023    1:21 PM  Call back number  Post procedure Call Back phone  # [phone]  Permission to leave phone message Yes     Patient questions:  Do you have a fever, pain , or abdominal swelling? No. Pain Score  0 *  Have you tolerated food without any problems? Yes.    Have you been able to return to your normal activities? Yes.    Do you have any questions about your discharge instructions: Diet   No. Medications  No. Follow up visit  No.  Do you have questions or concerns about your Care? No.  Actions: * If pain score is 4 or above: No action needed, pain <4.

## 2023-11-29 LAB — SURGICAL PATHOLOGY

## 2024-03-13 ENCOUNTER — Other Ambulatory Visit: Payer: Self-pay | Admitting: Family Medicine

## 2024-03-13 DIAGNOSIS — Z1231 Encounter for screening mammogram for malignant neoplasm of breast: Secondary | ICD-10-CM

## 2024-05-02 ENCOUNTER — Ambulatory Visit
Admission: RE | Admit: 2024-05-02 | Discharge: 2024-05-02 | Disposition: A | Discharge status: Home / Self Care | Source: Ambulatory Visit | Attending: Family Medicine | Admitting: Family Medicine

## 2024-05-02 DIAGNOSIS — Z1231 Encounter for screening mammogram for malignant neoplasm of breast: Secondary | ICD-10-CM

## 2024-10-31 ENCOUNTER — Encounter: Payer: Self-pay | Admitting: Gastroenterology

## 2024-10-31 ENCOUNTER — Ambulatory Visit: Payer: Self-pay | Admitting: Gastroenterology

## 2024-10-31 ENCOUNTER — Ambulatory Visit (INDEPENDENT_AMBULATORY_CARE_PROVIDER_SITE_OTHER): Admitting: Gastroenterology

## 2024-10-31 ENCOUNTER — Other Ambulatory Visit

## 2024-10-31 VITALS — BP 132/80 | HR 72 | Ht 62.0 in | Wt 218.0 lb

## 2024-10-31 DIAGNOSIS — K5790 Diverticulosis of intestine, part unspecified, without perforation or abscess without bleeding: Secondary | ICD-10-CM | POA: Diagnosis not present

## 2024-10-31 DIAGNOSIS — D509 Iron deficiency anemia, unspecified: Secondary | ICD-10-CM

## 2024-10-31 DIAGNOSIS — K625 Hemorrhage of anus and rectum: Secondary | ICD-10-CM

## 2024-10-31 DIAGNOSIS — R110 Nausea: Secondary | ICD-10-CM | POA: Diagnosis not present

## 2024-10-31 DIAGNOSIS — K648 Other hemorrhoids: Secondary | ICD-10-CM

## 2024-10-31 DIAGNOSIS — R1030 Lower abdominal pain, unspecified: Secondary | ICD-10-CM | POA: Diagnosis not present

## 2024-10-31 LAB — CBC WITH DIFFERENTIAL/PLATELET
Basophils Absolute: 0 K/uL (ref 0.0–0.1)
Basophils Relative: 0.6 % (ref 0.0–3.0)
Eosinophils Absolute: 0.5 K/uL (ref 0.0–0.7)
Eosinophils Relative: 6.6 % — ABNORMAL HIGH (ref 0.0–5.0)
HCT: 40.2 % (ref 36.0–46.0)
Hemoglobin: 12.9 g/dL (ref 12.0–15.0)
Lymphocytes Relative: 26 % (ref 12.0–46.0)
Lymphs Abs: 2 K/uL (ref 0.7–4.0)
MCHC: 32 g/dL (ref 30.0–36.0)
MCV: 69.8 fl — ABNORMAL LOW (ref 78.0–100.0)
Monocytes Absolute: 0.6 K/uL (ref 0.1–1.0)
Monocytes Relative: 8.3 % (ref 3.0–12.0)
Neutro Abs: 4.5 K/uL (ref 1.4–7.7)
Neutrophils Relative %: 58.5 % (ref 43.0–77.0)
Platelets: 321 K/uL (ref 150.0–400.0)
RBC: 5.77 Mil/uL — ABNORMAL HIGH (ref 3.87–5.11)
RDW: 16.7 % — ABNORMAL HIGH (ref 11.5–15.5)
WBC: 7.7 K/uL (ref 4.0–10.5)

## 2024-10-31 LAB — IBC + FERRITIN
Ferritin: 19.6 ng/mL (ref 10.0–291.0)
Iron: 58 ug/dL (ref 42–145)
Saturation Ratios: 16.3 % — ABNORMAL LOW (ref 20.0–50.0)
TIBC: 355.6 ug/dL (ref 250.0–450.0)
Transferrin: 254 mg/dL (ref 212.0–360.0)

## 2024-10-31 MED ORDER — HYDROCORTISONE ACETATE 25 MG RE SUPP: 25.0000 mg | Freq: Every evening | RECTAL | 0 refills | Status: AC

## 2024-10-31 NOTE — Progress Notes (Signed)
 Chief Complaint: follow-up  Primary GI Doctor: Dr. San   HPI: 63 y.o. female with a past medical history of hypertension, hyperlipidemia, type II, Cushing's syndrome, colon polyps and pandiverticulosis with recurrent diverticular bleed. Past cholecystectomy, adrenalectomy and partial hysterectomy.   She has had multiple hospital admissions for recurrent, stuttering diverticular bleed.   First bleed was 06/2015.  CT scan with scattered diverticulosis.   Hospital admission in 05/2016 with recurrent bleed.  CT angiogram was notable for extensive colonic diverticulosis with active diverticular bleed in the proximal descending colon just beyond the splenic flexure.  Was taken to the IR suite but angiogram was negative for active bleeding.   ER evaluation for recurrent bleed in 05/2018.  CT abdomen with extensive diverticulosis without diverticulitis.   Hospital admission 10/2020.  Tagged RBC scan unremarkable.     Hospital admission in 11/2020.  Negative CTA and negative tagged RBC scan.  Unremarkable EGD, colonoscopy, video capsule endoscopy and diagnosed with self-limiting diverticular bleed.   Admitted 07/07/23-07/15/23 for likely diverticular bleed.  Required 3 unit PRBC transfusion.  Multiple CT angiogram studies were negative for active bleeding.  Hemoglobin stabilized at 8.2 on discharge.   Admitted 07/19/23-07/22/23 again for lower GI bleeding.  Required 1 unit PRBC transfusion.  CT angiogram again negative for source of bleeding.  Underwent colonoscopy 9/4 which showed pancolonic diverticulosis without evidence of active bleeding.  GI felt this was likely stuttering diverticular bleeding.  Hemoglobin 8.1 on discharge.  Admitted 12/22-24 with recurrent, stuttering diverticular bleed.  CT angio was again negative for active bleeding, with diverticulosis and no diverticulitis.  She was monitored, treated with IV iron  (ferritin 5, iron  36, sat 9%) and discharged with hemoglobin 9.9.   12/2023  Referral Dr Teresa. Reviewed case, would like to be more confident in where the bleeding is actually coming from before taking patient to surgery.   Interval History Patient last seen in GI office on 11/18/23 by Dr. San for hospital follow-up for diverticular bleed.  Patient presents for evaluation of abdominal pain, diarrhea, and BRBPR She reports last Friday she had BRBPR with wiping after having multiple loose stools. Then on Sunday she had a darker burgundy stool. She went to full liquid diet for 24 hours. She reports she woke up Monday with regular formed Mccollum stool. No BM today. She reports having intermittent diarrhea for past 3 weeks. She will have few days regular BM's followed by diarrhea. She took the Bentyl  which helped some.  She reports the pain started on RUQ and radiated down her abdomen then across to left side. She reports working with a systems analyst and doing  back twists about 3 weeks ago and thought it Arnetta Odeh have been related.  She reports she had been going to gym 4 times a week, but has held off last few weeks.  No exposure. No recent travel. Mild nausea last week. No fever or chills.  Avoids all NSAIDs.  Not currently on iron  supplement po daily .  Hx of GERD, but not currently taking any acid suppression therapy. Previously prescribed Protonix . Will use Mylanta prn nocturnal breakthrough.    Last Ozempic injection was last Friday. Has been taking for >1 year.   GI PROCEDURES:  11/23/2023 EGD: Ectopic gastric mucosa in the upper third of the esophagus. - Esophageal polyp was found in the lower esophagus. Resected and retrieved.  Z- line regular, 40 cm from the incisors.  Non- ulcer gastritis. Biopsied.  A single gastric polyp. Resected and retrieved. Normal  examined duodenum. Biopsied. Path: The polyp removed from the esophagus was a benign squamous papilloma. The biopsies from the stomach and duodenum were otherwise normal. The polyp removed from the stomach  was a benign fundic gland polyp.    Colonoscopy 07/21/2023: Pandiverticulosis with highest burden in the sigmoid colon.  No blood or heme noted anywhere.  No stigmata of recent bleeding.   Small bowel capsule endoscopy 12/03/2020: Small area of focal erythema versus subtle nonbleeding erosion in the distal small bowel. This appears to be an incidental finding and not likely to be of clinical consequence and unlikely to be source of recurrent bleeding. This would be out of reach of a single balloon enteroscopy and do not feel that referral for retrograde double-balloon enteroscopy is necessary. Otherwise completely normal study.  No blood noted throughout the small bowel lumen and visualized colon. Based on these findings and recent extensive evaluation favor recurrent diverticular bleed.   EGD 11/28/2020: - Ectopic gastric mucosa in the upper third of the esophagus.  Otherwise normal esophagus.  Benign gastric polyps, otherwise normal stomach and normal duodenum.  Successful endoscopic placement of VCE.   Colonoscopy 11/22/2020: - One 9 mm polyp at the appendiceal orifice, removed with a cold snare (path: Benign), one 6 mm hyperplastic polyp in the transverse colon, internal hemorrhoid, moderate diverticulosis in the entire examined colon. There was evidence of an impacted diverticulum. There was no evidence of diverticular bleeding. No blood in the colon.   Colonoscopy 10/03/2019: - Two 6 to 8 mm polyps in the descending colon and in the transverse colon, removed with a cold snare. Resected and retrieved.  - Mild diverticulosis in the right colon. There was no evidence of diverticular bleeding.  - Moderate diverticulosis in the left colon. There was no evidence of diverticular bleeding.  - Internal hemorrhoids.  - The examination was otherwise normal on direct and retroflexion views   EGD 07/16/2015: Normal esophagus, stomach, duodenum   Colonoscopy 01/09/2013 (routine CRC screening): Moderate  left-sided diverticulosis, otherwise normal Wt Readings from Last 3 Encounters:  10/31/24 218 lb (98.9 kg)  11/23/23 220 lb (99.8 kg)  11/18/23 220 lb 6 oz (100 kg)    Past Medical History:  Diagnosis Date   Anemia    Cushing's syndrome    due to adrenal tumor   Diabetes mellitus without complication (HCC)    insulin -dependent   Diverticulosis    GI bleed    Hyperlipidemia    Hypertension    Polycystic ovary disease    Sepsis (HCC) 05/2019    Past Surgical History:  Procedure Laterality Date   ABDOMINAL HYSTERECTOMY  01/2023   ADRENALECTOMY  1996   BREAST BIOPSY Right 2018   BREAST BIOPSY Left    x2   CHOLECYSTECTOMY     COLONOSCOPY WITH PROPOFOL  N/A 10/03/2019   Procedure: COLONOSCOPY WITH PROPOFOL ;  Surgeon: Aneita Gwendlyn DASEN, MD;  Location: [facility];  Service: Endoscopy;  Laterality: N/A;   COLONOSCOPY WITH PROPOFOL  N/A 11/22/2020   Procedure: COLONOSCOPY WITH PROPOFOL ;  Surgeon: Aneita Gwendlyn DASEN, MD;  Location: [facility];  Service: Endoscopy;  Laterality: N/A;   COLONOSCOPY WITH PROPOFOL  N/A 07/21/2023   Procedure: COLONOSCOPY WITH PROPOFOL ;  Surgeon: Leigh Elspeth SQUIBB, MD;  Location: [facility] ENDOSCOPY;  Service: Gastroenterology;  Laterality: N/A;   ESOPHAGOGASTRODUODENOSCOPY (EGD) WITH PROPOFOL  N/A 07/16/2015   Procedure: ESOPHAGOGASTRODUODENOSCOPY (EGD) WITH PROPOFOL ;  Surgeon: Toribio SQUIBB Cedar, MD;  Location: [facility];  Service: Endoscopy;  Laterality: N/A;   ESOPHAGOGASTRODUODENOSCOPY (EGD) WITH PROPOFOL   N/A 11/28/2020   Procedure: ESOPHAGOGASTRODUODENOSCOPY (EGD) WITH PROPOFOL ;  Surgeon: San Sandor GAILS, DO;  Location: [facility];  Service: Gastroenterology;  Laterality: N/A;   GIVENS CAPSULE STUDY N/A 11/28/2020   Procedure: GIVENS CAPSULE STUDY;  Surgeon: San Sandor GAILS, DO;  Location: [facility];  Service: Gastroenterology;  Laterality: N/A;   POLYPECTOMY  10/03/2019   Procedure: POLYPECTOMY;  Surgeon: Aneita Gwendlyn DASEN, MD;  Location: [facility];  Service: Endoscopy;;   POLYPECTOMY  11/22/2020   Procedure: POLYPECTOMY;  Surgeon: Aneita Gwendlyn DASEN, MD;  Location: [facility];  Service: Endoscopy;;    Current Outpatient Medications  Medication Sig Dispense Refill   acetaminophen  (TYLENOL ) 500 MG tablet Take 1,000 mg by mouth every 6 (six) hours as needed for mild pain.     atorvastatin  (LIPITOR) 10 MG tablet Take 10 mg by mouth daily.     carvedilol  (COREG ) 25 MG tablet Take 25 mg by mouth 2 (two) times daily.     Continuous Glucose Sensor (DEXCOM G7 SENSOR) MISC      dicyclomine  (BENTYL ) 20 MG tablet TAKE 1 TABLET (20 MG TOTAL) BY MOUTH EVERY 8 (EIGHT) HOURS AS NEEDED FOR SPASMS. 30 tablet 3   empagliflozin  (JARDIANCE ) 10 MG TABS tablet Take 10 mg by mouth daily.     insulin  aspart protamine - aspart (NOVOLOG  MIX 70/30 FLEXPEN) (70-30) 100 UNIT/ML FlexPen Inject 0-30 Units into the skin 2 (two) times daily. Dose per sliding scale.     metFORMIN  (GLUCOPHAGE ) 500 MG tablet Take 500 mg by mouth 2 (two) times daily with a meal.     Multiple Vitamins-Minerals (MULTIVITAMIN WITH MINERALS) tablet Take 1 tablet by mouth daily.     olmesartan  (BENICAR ) 40 MG tablet Take 40 mg by mouth daily.     OZEMPIC, 1 MG/DOSE, 4 MG/3ML SOPN Inject 4 mg into the skin.     methocarbamol  (ROBAXIN ) 500 MG tablet Take 500 mg by mouth every 6 (six) hours as needed for muscle spasms. (Patient not taking: Reported on 10/31/2024)     No current facility-administered medications for this visit.    Allergies as of 10/31/2024 - Review Complete 10/31/2024  Allergen Reaction Noted   Norvasc [amlodipine besylate] Swelling 02/22/2012   Nsaids Other (See Comments) 08/07/2015   Sulfa antibiotics Other (See Comments) 02/22/2012    Family History  Problem Relation Age of Onset   Hypertension Mother    Cancer Mother        ?uterine or ovarian cancer   Hypertension Father    Diabetes Father    Hypertension Sister    Diabetes Sister    Hypertension  Brother    Diabetes Brother    Heart failure Brother    Heart disease Brother    Breast cancer Maternal Grandmother    Hypertension Maternal Grandfather    Diabetes Paternal Grandmother    Colon cancer Paternal Grandfather    Stomach cancer Maternal Great-grandfather    Rectal cancer Neg Hx    Esophageal cancer Neg Hx     Review of Systems:    Constitutional: No weight loss, fever, chills, weakness or fatigue HEENT: Eyes: No change in vision               Ears, Nose, Throat:  No change in hearing or congestion Skin: No rash or itching Cardiovascular: No chest pain, chest pressure or palpitations   Respiratory: No SOB or cough Gastrointestinal: See HPI and otherwise negative Genitourinary: No dysuria or change in urinary frequency Neurological: No headache,  dizziness or syncope Musculoskeletal: No new muscle or joint pain Hematologic: No bleeding or bruising Psychiatric: No history of depression or anxiety    Physical Exam:  Vital signs: BP 132/80 (BP Location: Left Arm, Patient Position: Sitting, Cuff Size: Large)   Pulse 72   Ht 5' 2 (1.575 m) Comment: height measured without shoes  Wt 218 lb (98.9 kg)   BMI 39.87 kg/m   Constitutional:   Pleasant female appears to be in NAD, Well developed, Well nourished, alert and cooperative Eyes:   PEERL, EOMI. No icterus. Conjunctiva pink. Neck:  Supple Throat: Oral cavity and pharynx without inflammation, swelling or lesion.  Respiratory: Respirations even and unlabored. Lungs clear to auscultation bilaterally.   No wheezes, crackles, or rhonchi.  Cardiovascular: Normal S1, S2. Regular rate and rhythm. No peripheral edema, cyanosis or pallor.  Gastrointestinal:  Soft, nondistended, lower abdominal tenderness with palpation. No rebound or guarding. Normal bowel sounds. No appreciable masses or hepatomegaly. Rectal:  Not performed.  Msk:  Symmetrical without gross deformities. Without edema, no deformity or joint abnormality.   Neurologic:  Alert and  oriented x4;  grossly normal neurologically.  Skin:   Dry and intact without significant lesions or rashes.  RELEVANT LABS AND IMAGING: CBC    Latest Ref Rng & Units 11/09/2023    6:11 AM 11/08/2023    7:41 PM 11/08/2023    4:32 AM  CBC  WBC 4.0 - 10.5 K/uL 8.3   8.7   Hemoglobin 12.0 - 15.0 g/dL 9.9  9.9  89.8   Hematocrit 36.0 - 46.0 % 33.0  33.1  32.9   Platelets 150 - 400 K/uL 336   334      CMP     Latest Ref Rng & Units 11/08/2023    4:32 AM 11/07/2023   11:24 AM 07/22/2023   11:24 AM  CMP  Glucose 70 - 99 mg/dL 79  899  879   BUN 8 - 23 mg/dL 5  8  5    Creatinine 0.44 - 1.00 mg/dL 9.03  9.22  9.24   Sodium 135 - 145 mmol/L 137  137  139   Potassium 3.5 - 5.1 mmol/L 3.5  3.4  3.4   Chloride 98 - 111 mmol/L 103  103  105   CO2 22 - 32 mmol/L 24  25  28    Calcium  8.9 - 10.3 mg/dL 9.0  9.5  9.0   Total Protein 6.5 - 8.1 g/dL  7.6    Total Bilirubin <1.2 mg/dL  0.5    Alkaline Phos 38 - 126 U/L  56    AST 15 - 41 U/L  18    ALT 0 - 44 U/L  13       Lab Results  Component Value Date   TSH 0.905 09/17/2019    Lab Results  Component Value Date   IRON  36 11/07/2023   TIBC 412 11/07/2023   FERRITIN 5 (L) 11/07/2023     Assessment/Plan: Encounter Diagnoses  Name Primary?   Internal hemorrhoids Yes   Rectal bleeding    Nausea    Iron  deficiency anemia, unspecified iron  deficiency anemia type    Diverticulosis    Lower abdominal pain    #1 Diverticulosis with hx of recurrent diverticular bleed  Multiple hospitalizations for recurrent, stuttering diverticular bleed. Had 1 positive CT angiography in 05/2016, but unfortunately subsequent angiogram was negative. Otherwise, has had multiple advanced imaging studies including tagged RBC scan, multiple CT angiography's, colonoscopy, VCE, EGD,  have all been without active bleed. She does have extensive pandiverticulosis on imaging and colonoscopy. No surgical indications at this time. #2  Abdominal pain, lower  #3 Nausea #4 Iron  deficiency anemia -Repeat CBC, BMET and iron  panel/TIBC -Recommend CTAP, patient would like to hold off -Add Psyllium husk 1 teaspoon po daily -Sent script for Anusol  suppository 1 at bedtime prn -Continue Bentyl  prn as needed  -Call/update office in 2 weeks with a condition update ( or sooner if has recurrent bleeding)  -Advised to go to the ER if there is any severe weakness, severe abdominal pain, vomit blood, dark red blood in your bowel movement, shortness of breath or chest pain.    Thank you for the courtesy of this consult. Please call me with any questions or concerns.   Georgine Wiltse, FNP-C [facility] 10/31/2024, 9:36 AM  Cc: Gerome Brunet, DO

## 2024-10-31 NOTE — Patient Instructions (Addendum)
 Hemorrhoids Pamphlet provided Use Suppository 1 at bedtime for 7 days, sent prescription can use samples first No straining or pushing No prolonged sitting on toilet  Diarrhea Add Psyllium husk 1 teaspoon po daily Avoid food triggers   --Call/update office in 2 weeks with a condition update ( or sooner if recurrent bleeding)  Your provider has requested that you go to the basement level for lab work before leaving today. Press B on the elevator. The lab is located at the first door on the left as you exit the elevator.  _______________________________________________________  If your blood pressure at your visit was 140/90 or greater, please contact your primary care physician to follow up on this.  _______________________________________________________  If you are age 29 or older, your body mass index should be between 23-30. Your Body mass index is 39.87 kg/m. If this is out of the aforementioned range listed, please consider follow up with your Primary Care Provider.  If you are age 56 or younger, your body mass index should be between 19-25. Your Body mass index is 39.87 kg/m. If this is out of the aformentioned range listed, please consider follow up with your Primary Care Provider.   ________________________________________________________  The [facility] providers would like to encourage you to use MYCHART to communicate with providers for non-urgent requests or questions.  Due to long hold times on the telephone, sending your provider a message by [facility] may be a faster and more efficient way to get a response.  Please allow 48 business hours for a response.  Please remember that this is for non-urgent requests.  _______________________________________________________  [facility] is using a team-based approach to care.  Your team is made up of your doctor and two to three APPS. Our APPS (Nurse Practitioners and Physician Assistants) work with your physician  to ensure care continuity for you. They are fully qualified to address your health concerns and develop a treatment plan. They communicate directly with your gastroenterologist to care for you. Seeing the Advanced Practice Practitioners on your physician's team can help you by facilitating care more promptly, often allowing for earlier appointments, access to diagnostic testing, procedures, and other specialty referrals.   Thank you for trusting me with your gastrointestinal care. Deanna May, FNP-C

## 2024-11-01 ENCOUNTER — Other Ambulatory Visit: Payer: Self-pay

## 2024-11-01 DIAGNOSIS — D509 Iron deficiency anemia, unspecified: Secondary | ICD-10-CM

## 2024-12-11 ENCOUNTER — Inpatient Hospital Stay: Admitting: Hematology and Oncology

## 2024-12-11 ENCOUNTER — Inpatient Hospital Stay

## 2024-12-21 ENCOUNTER — Inpatient Hospital Stay

## 2025-01-04 ENCOUNTER — Inpatient Hospital Stay
